# Patient Record
Sex: Female | Born: 1945
Health system: Southern US, Community
[De-identification: ages and names within clinical notes are randomized; demographics above are authoritative.]

## PROBLEM LIST (undated history)

## (undated) DIAGNOSIS — R3989 Other symptoms and signs involving the genitourinary system: Secondary | ICD-10-CM

## (undated) DIAGNOSIS — R3915 Urgency of urination: Secondary | ICD-10-CM

## (undated) DIAGNOSIS — H269 Unspecified cataract: Secondary | ICD-10-CM

## (undated) DIAGNOSIS — J42 Unspecified chronic bronchitis: Secondary | ICD-10-CM

## (undated) DIAGNOSIS — J410 Simple chronic bronchitis: Secondary | ICD-10-CM

## (undated) DIAGNOSIS — R058 Other specified cough: Secondary | ICD-10-CM

## (undated) DIAGNOSIS — E785 Hyperlipidemia, unspecified: Secondary | ICD-10-CM

## (undated) DIAGNOSIS — D649 Anemia, unspecified: Secondary | ICD-10-CM

## (undated) DIAGNOSIS — T8859XA Other complications of anesthesia, initial encounter: Secondary | ICD-10-CM

## (undated) DIAGNOSIS — T4145XA Adverse effect of unspecified anesthetic, initial encounter: Secondary | ICD-10-CM

## (undated) DIAGNOSIS — Z8719 Personal history of other diseases of the digestive system: Secondary | ICD-10-CM

## (undated) DIAGNOSIS — K589 Irritable bowel syndrome without diarrhea: Secondary | ICD-10-CM

## (undated) DIAGNOSIS — Z9889 Other specified postprocedural states: Secondary | ICD-10-CM

## (undated) DIAGNOSIS — N3010 Interstitial cystitis (chronic) without hematuria: Secondary | ICD-10-CM

## (undated) DIAGNOSIS — Z860101 Personal history of adenomatous and serrated colon polyps: Secondary | ICD-10-CM

## (undated) DIAGNOSIS — K2270 Barrett's esophagus without dysplasia: Secondary | ICD-10-CM

## (undated) DIAGNOSIS — I739 Peripheral vascular disease, unspecified: Secondary | ICD-10-CM

## (undated) DIAGNOSIS — K8681 Exocrine pancreatic insufficiency: Secondary | ICD-10-CM

## (undated) DIAGNOSIS — T7840XA Allergy, unspecified, initial encounter: Secondary | ICD-10-CM

## (undated) DIAGNOSIS — F329 Major depressive disorder, single episode, unspecified: Secondary | ICD-10-CM

## (undated) DIAGNOSIS — R350 Frequency of micturition: Secondary | ICD-10-CM

## (undated) DIAGNOSIS — F32A Depression, unspecified: Secondary | ICD-10-CM

## (undated) DIAGNOSIS — K219 Gastro-esophageal reflux disease without esophagitis: Secondary | ICD-10-CM

## (undated) DIAGNOSIS — I10 Essential (primary) hypertension: Secondary | ICD-10-CM

## (undated) DIAGNOSIS — K449 Diaphragmatic hernia without obstruction or gangrene: Secondary | ICD-10-CM

## (undated) DIAGNOSIS — J449 Chronic obstructive pulmonary disease, unspecified: Secondary | ICD-10-CM

## (undated) DIAGNOSIS — Z86010 Personal history of colonic polyps: Secondary | ICD-10-CM

## (undated) DIAGNOSIS — F419 Anxiety disorder, unspecified: Secondary | ICD-10-CM

## (undated) DIAGNOSIS — K5790 Diverticulosis of intestine, part unspecified, without perforation or abscess without bleeding: Secondary | ICD-10-CM

## (undated) DIAGNOSIS — R05 Cough: Secondary | ICD-10-CM

## (undated) HISTORY — DX: Unspecified cataract: H26.9

## (undated) HISTORY — DX: Exocrine pancreatic insufficiency: K86.81

## (undated) HISTORY — DX: Barrett's esophagus without dysplasia: K22.70

## (undated) HISTORY — DX: Hyperlipidemia, unspecified: E78.5

## (undated) HISTORY — PX: OTHER SURGICAL HISTORY: SHX169

## (undated) HISTORY — DX: Allergy, unspecified, initial encounter: T78.40XA

## (undated) HISTORY — DX: Chronic obstructive pulmonary disease, unspecified: J44.9

## (undated) HISTORY — DX: Anxiety disorder, unspecified: F41.9

## (undated) HISTORY — DX: Major depressive disorder, single episode, unspecified: F32.9

## (undated) HISTORY — DX: Depression, unspecified: F32.A

## (undated) HISTORY — PX: COLONOSCOPY: SHX174

## (undated) HISTORY — DX: Diverticulosis of intestine, part unspecified, without perforation or abscess without bleeding: K57.90

## (undated) HISTORY — DX: Interstitial cystitis (chronic) without hematuria: N30.10

## (undated) HISTORY — DX: Diaphragmatic hernia without obstruction or gangrene: K44.9

## (undated) HISTORY — DX: Anemia, unspecified: D64.9

## (undated) HISTORY — DX: Irritable bowel syndrome, unspecified: K58.9

## (undated) HISTORY — PX: ESOPHAGOGASTRODUODENOSCOPY: SHX1529

## (undated) HISTORY — DX: Gastro-esophageal reflux disease without esophagitis: K21.9

---
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
## 1973-07-31 HISTORY — PX: ABDOMINAL HYSTERECTOMY: SHX81

## 1988-07-31 HISTORY — PX: TEMPOROMANDIBULAR JOINT SURGERY: SHX35

## 1989-07-31 HISTORY — PX: SINUS SURGERY WITH INSTATRAK: SHX5215

## 1991-08-01 HISTORY — PX: CARPAL TUNNEL RELEASE: SHX101

## 1998-02-18 ENCOUNTER — Ambulatory Visit (HOSPITAL_COMMUNITY): Admission: RE | Admit: 1998-02-18 | Discharge: 1998-02-18 | Payer: Self-pay | Admitting: Gastroenterology

## 1998-12-10 ENCOUNTER — Ambulatory Visit (HOSPITAL_COMMUNITY): Admission: RE | Admit: 1998-12-10 | Discharge: 1998-12-10 | Payer: Self-pay | Admitting: Gastroenterology

## 1998-12-10 ENCOUNTER — Encounter: Payer: Self-pay | Admitting: Gastroenterology

## 1998-12-15 ENCOUNTER — Ambulatory Visit (HOSPITAL_COMMUNITY): Admission: RE | Admit: 1998-12-15 | Discharge: 1998-12-15 | Payer: Self-pay | Admitting: Gastroenterology

## 1998-12-15 ENCOUNTER — Encounter: Payer: Self-pay | Admitting: Gastroenterology

## 1999-01-03 ENCOUNTER — Encounter: Payer: Self-pay | Admitting: General Surgery

## 1999-01-07 ENCOUNTER — Observation Stay (HOSPITAL_COMMUNITY): Admission: RE | Admit: 1999-01-07 | Discharge: 1999-01-08 | Payer: Self-pay | Admitting: General Surgery

## 1999-09-08 ENCOUNTER — Other Ambulatory Visit: Admission: RE | Admit: 1999-09-08 | Discharge: 1999-09-08 | Payer: Self-pay | Admitting: Obstetrics and Gynecology

## 2000-05-17 ENCOUNTER — Encounter: Payer: Self-pay | Admitting: Gastroenterology

## 2000-05-17 ENCOUNTER — Ambulatory Visit (HOSPITAL_COMMUNITY): Admission: RE | Admit: 2000-05-17 | Discharge: 2000-05-17 | Payer: Self-pay | Admitting: Gastroenterology

## 2000-07-31 HISTORY — PX: LAPAROSCOPIC CHOLECYSTECTOMY: SUR755

## 2000-11-21 ENCOUNTER — Encounter: Payer: Self-pay | Admitting: Obstetrics and Gynecology

## 2000-11-21 ENCOUNTER — Encounter: Admission: RE | Admit: 2000-11-21 | Discharge: 2000-11-21 | Payer: Self-pay | Admitting: Obstetrics and Gynecology

## 2000-11-26 ENCOUNTER — Other Ambulatory Visit: Admission: RE | Admit: 2000-11-26 | Discharge: 2000-11-26 | Payer: Self-pay | Admitting: Obstetrics and Gynecology

## 2001-12-05 ENCOUNTER — Encounter: Payer: Self-pay | Admitting: Internal Medicine

## 2001-12-05 ENCOUNTER — Ambulatory Visit (HOSPITAL_COMMUNITY): Admission: RE | Admit: 2001-12-05 | Discharge: 2001-12-05 | Payer: Self-pay | Admitting: Specialist

## 2002-03-19 ENCOUNTER — Other Ambulatory Visit: Admission: RE | Admit: 2002-03-19 | Discharge: 2002-03-19 | Payer: Self-pay | Admitting: Obstetrics and Gynecology

## 2002-04-10 ENCOUNTER — Encounter (INDEPENDENT_AMBULATORY_CARE_PROVIDER_SITE_OTHER): Payer: Self-pay | Admitting: Gastroenterology

## 2002-04-10 DIAGNOSIS — K2940 Chronic atrophic gastritis without bleeding: Secondary | ICD-10-CM | POA: Insufficient documentation

## 2002-04-10 DIAGNOSIS — K449 Diaphragmatic hernia without obstruction or gangrene: Secondary | ICD-10-CM | POA: Insufficient documentation

## 2002-09-07 ENCOUNTER — Ambulatory Visit (HOSPITAL_COMMUNITY): Admission: RE | Admit: 2002-09-07 | Discharge: 2002-09-07 | Payer: Self-pay | Admitting: Internal Medicine

## 2002-09-07 ENCOUNTER — Encounter: Payer: Self-pay | Admitting: Internal Medicine

## 2003-04-10 ENCOUNTER — Other Ambulatory Visit: Admission: RE | Admit: 2003-04-10 | Discharge: 2003-04-10 | Payer: Self-pay | Admitting: Obstetrics and Gynecology

## 2003-06-05 ENCOUNTER — Encounter: Admission: RE | Admit: 2003-06-05 | Discharge: 2003-06-05 | Payer: Self-pay | Admitting: Obstetrics and Gynecology

## 2003-06-19 ENCOUNTER — Encounter: Admission: RE | Admit: 2003-06-19 | Discharge: 2003-06-19 | Payer: Self-pay | Admitting: Obstetrics and Gynecology

## 2004-06-20 ENCOUNTER — Encounter: Admission: RE | Admit: 2004-06-20 | Discharge: 2004-06-20 | Payer: Self-pay | Admitting: Obstetrics and Gynecology

## 2005-06-09 ENCOUNTER — Other Ambulatory Visit: Admission: RE | Admit: 2005-06-09 | Discharge: 2005-06-09 | Payer: Self-pay | Admitting: Obstetrics and Gynecology

## 2005-12-13 ENCOUNTER — Ambulatory Visit: Payer: Self-pay | Admitting: Gastroenterology

## 2006-09-27 ENCOUNTER — Encounter (INDEPENDENT_AMBULATORY_CARE_PROVIDER_SITE_OTHER): Payer: Self-pay | Admitting: Specialist

## 2006-09-27 ENCOUNTER — Observation Stay (HOSPITAL_COMMUNITY): Admission: RE | Admit: 2006-09-27 | Discharge: 2006-09-28 | Payer: Self-pay | Admitting: Otolaryngology

## 2006-09-27 HISTORY — PX: MICROLARYNGOSCOPY: SHX5208

## 2007-02-19 ENCOUNTER — Ambulatory Visit: Payer: Self-pay | Admitting: Internal Medicine

## 2007-02-19 LAB — CONVERTED CEMR LAB
Amylase: 34 units/L (ref 27–131)
Basophils Relative: 0.2 % (ref 0.0–1.0)
Eosinophils Absolute: 0.3 10*3/uL (ref 0.0–0.6)
Eosinophils Relative: 4.7 % (ref 0.0–5.0)
Hemoglobin: 13 g/dL (ref 12.0–15.0)
Lymphocytes Relative: 25.7 % (ref 12.0–46.0)
MCV: 90.3 fL (ref 78.0–100.0)
Monocytes Absolute: 0.4 10*3/uL (ref 0.2–0.7)
Monocytes Relative: 7.1 % (ref 3.0–11.0)
Neutro Abs: 3.5 10*3/uL (ref 1.4–7.7)
Platelets: 198 10*3/uL (ref 150–400)
Sed Rate: 22 mm/hr (ref 0–25)

## 2007-02-28 ENCOUNTER — Ambulatory Visit: Payer: Self-pay | Admitting: Internal Medicine

## 2007-03-14 ENCOUNTER — Ambulatory Visit: Payer: Self-pay | Admitting: Internal Medicine

## 2007-03-14 DIAGNOSIS — K5730 Diverticulosis of large intestine without perforation or abscess without bleeding: Secondary | ICD-10-CM | POA: Insufficient documentation

## 2007-03-14 HISTORY — DX: Diverticulosis of large intestine without perforation or abscess without bleeding: K57.30

## 2007-03-30 LAB — CONVERTED CEMR LAB: Pap Smear: NORMAL

## 2007-11-13 DIAGNOSIS — Z8669 Personal history of other diseases of the nervous system and sense organs: Secondary | ICD-10-CM | POA: Insufficient documentation

## 2007-11-13 DIAGNOSIS — K224 Dyskinesia of esophagus: Secondary | ICD-10-CM | POA: Insufficient documentation

## 2007-11-13 DIAGNOSIS — F3289 Other specified depressive episodes: Secondary | ICD-10-CM | POA: Insufficient documentation

## 2007-11-13 DIAGNOSIS — E785 Hyperlipidemia, unspecified: Secondary | ICD-10-CM | POA: Insufficient documentation

## 2007-11-13 DIAGNOSIS — K589 Irritable bowel syndrome without diarrhea: Secondary | ICD-10-CM

## 2007-11-13 DIAGNOSIS — F411 Generalized anxiety disorder: Secondary | ICD-10-CM | POA: Insufficient documentation

## 2007-11-13 DIAGNOSIS — F329 Major depressive disorder, single episode, unspecified: Secondary | ICD-10-CM

## 2007-11-13 DIAGNOSIS — K219 Gastro-esophageal reflux disease without esophagitis: Secondary | ICD-10-CM | POA: Insufficient documentation

## 2007-11-13 DIAGNOSIS — E7849 Other hyperlipidemia: Secondary | ICD-10-CM

## 2007-11-13 HISTORY — DX: Irritable bowel syndrome, unspecified: K58.9

## 2007-12-18 ENCOUNTER — Encounter: Admission: RE | Admit: 2007-12-18 | Discharge: 2007-12-18 | Payer: Self-pay | Admitting: Otolaryngology

## 2008-01-13 ENCOUNTER — Telehealth: Payer: Self-pay | Admitting: Internal Medicine

## 2008-01-14 ENCOUNTER — Encounter: Payer: Self-pay | Admitting: Internal Medicine

## 2008-01-14 ENCOUNTER — Ambulatory Visit: Payer: Self-pay | Admitting: Internal Medicine

## 2008-01-16 ENCOUNTER — Encounter: Payer: Self-pay | Admitting: Internal Medicine

## 2008-01-27 ENCOUNTER — Telehealth: Payer: Self-pay | Admitting: Internal Medicine

## 2008-03-06 ENCOUNTER — Telehealth: Payer: Self-pay | Admitting: Internal Medicine

## 2008-03-07 ENCOUNTER — Encounter
Admission: RE | Admit: 2008-03-07 | Discharge: 2008-03-07 | Payer: Self-pay | Admitting: Physical Medicine and Rehabilitation

## 2008-03-12 ENCOUNTER — Encounter: Payer: Self-pay | Admitting: Internal Medicine

## 2008-03-12 ENCOUNTER — Ambulatory Visit (HOSPITAL_COMMUNITY): Admission: RE | Admit: 2008-03-12 | Discharge: 2008-03-12 | Payer: Self-pay | Admitting: Internal Medicine

## 2008-03-12 ENCOUNTER — Ambulatory Visit: Payer: Self-pay | Admitting: *Deleted

## 2008-03-12 DIAGNOSIS — F341 Dysthymic disorder: Secondary | ICD-10-CM | POA: Insufficient documentation

## 2008-03-12 DIAGNOSIS — R259 Unspecified abnormal involuntary movements: Secondary | ICD-10-CM | POA: Insufficient documentation

## 2008-03-12 LAB — CONVERTED CEMR LAB
Basophils Absolute: 0 10*3/uL (ref 0.0–0.1)
Basophils Relative: 0.2 % (ref 0.0–3.0)
Calcium: 8.9 mg/dL (ref 8.4–10.5)
Creatinine, Ser: 0.7 mg/dL (ref 0.4–1.2)
Eosinophils Absolute: 0.1 10*3/uL (ref 0.0–0.7)
GFR calc Af Amer: 110 mL/min
GFR calc non Af Amer: 91 mL/min
Hemoglobin: 12.1 g/dL (ref 12.0–15.0)
MCHC: 33.5 g/dL (ref 30.0–36.0)
MCV: 93.8 fL (ref 78.0–100.0)
Neutro Abs: 5.1 10*3/uL (ref 1.4–7.7)
RBC: 3.84 M/uL — ABNORMAL LOW (ref 3.87–5.11)
RDW: 12.7 % (ref 11.5–14.6)
Sodium: 140 meq/L (ref 135–145)
TSH: 1.07 microintl units/mL (ref 0.35–5.50)

## 2008-04-01 ENCOUNTER — Encounter (INDEPENDENT_AMBULATORY_CARE_PROVIDER_SITE_OTHER): Payer: Self-pay | Admitting: *Deleted

## 2008-04-09 ENCOUNTER — Encounter: Admission: RE | Admit: 2008-04-09 | Discharge: 2008-04-09 | Payer: Self-pay | Admitting: Neurology

## 2008-04-14 ENCOUNTER — Telehealth: Payer: Self-pay | Admitting: Internal Medicine

## 2008-04-17 ENCOUNTER — Ambulatory Visit: Payer: Self-pay | Admitting: Internal Medicine

## 2008-04-17 DIAGNOSIS — R1310 Dysphagia, unspecified: Secondary | ICD-10-CM | POA: Insufficient documentation

## 2008-04-17 DIAGNOSIS — R1110 Vomiting, unspecified: Secondary | ICD-10-CM | POA: Insufficient documentation

## 2008-04-21 ENCOUNTER — Ambulatory Visit: Payer: Self-pay | Admitting: Cardiology

## 2008-04-23 ENCOUNTER — Ambulatory Visit (HOSPITAL_COMMUNITY): Admission: RE | Admit: 2008-04-23 | Discharge: 2008-04-23 | Payer: Self-pay | Admitting: Internal Medicine

## 2008-04-24 ENCOUNTER — Encounter: Payer: Self-pay | Admitting: Internal Medicine

## 2008-04-27 ENCOUNTER — Ambulatory Visit: Payer: Self-pay | Admitting: Family Medicine

## 2008-04-27 DIAGNOSIS — I739 Peripheral vascular disease, unspecified: Secondary | ICD-10-CM | POA: Insufficient documentation

## 2008-04-28 ENCOUNTER — Telehealth: Payer: Self-pay | Admitting: Internal Medicine

## 2008-05-04 ENCOUNTER — Telehealth: Payer: Self-pay | Admitting: Family Medicine

## 2008-05-06 LAB — CONVERTED CEMR LAB: Anti Nuclear Antibody(ANA): NEGATIVE

## 2008-05-07 ENCOUNTER — Encounter (INDEPENDENT_AMBULATORY_CARE_PROVIDER_SITE_OTHER): Payer: Self-pay | Admitting: *Deleted

## 2008-05-07 ENCOUNTER — Telehealth (INDEPENDENT_AMBULATORY_CARE_PROVIDER_SITE_OTHER): Payer: Self-pay | Admitting: *Deleted

## 2008-05-09 LAB — CONVERTED CEMR LAB
ALT: 16 units/L (ref 0–35)
Basophils Absolute: 0 10*3/uL (ref 0.0–0.1)
CO2: 30 meq/L (ref 19–32)
Calcium: 8.7 mg/dL (ref 8.4–10.5)
Creatinine, Ser: 1 mg/dL (ref 0.4–1.2)
Eosinophils Absolute: 0.2 10*3/uL (ref 0.0–0.7)
Eosinophils Relative: 3.1 % (ref 0.0–5.0)
Folate: 7.1 ng/mL
GFR calc Af Amer: 73 mL/min
HCT: 33.5 % — ABNORMAL LOW (ref 36.0–46.0)
Iron: 44 ug/dL (ref 42–145)
MCHC: 34.3 g/dL (ref 30.0–36.0)
MCV: 91.8 fL (ref 78.0–100.0)
Monocytes Absolute: 0.2 10*3/uL (ref 0.1–1.0)
Platelets: 212 10*3/uL (ref 150–400)
RDW: 12.7 % (ref 11.5–14.6)
TSH: 2.09 microintl units/mL (ref 0.35–5.50)
Total Bilirubin: 0.4 mg/dL (ref 0.3–1.2)
Total Protein: 6.5 g/dL (ref 6.0–8.3)
Transferrin: 194.8 mg/dL — ABNORMAL LOW (ref >=212.0)

## 2008-05-11 ENCOUNTER — Encounter (INDEPENDENT_AMBULATORY_CARE_PROVIDER_SITE_OTHER): Payer: Self-pay | Admitting: *Deleted

## 2008-05-14 ENCOUNTER — Ambulatory Visit: Payer: Self-pay | Admitting: Family Medicine

## 2008-05-21 ENCOUNTER — Telehealth (INDEPENDENT_AMBULATORY_CARE_PROVIDER_SITE_OTHER): Payer: Self-pay | Admitting: *Deleted

## 2008-05-22 ENCOUNTER — Encounter: Payer: Self-pay | Admitting: Family Medicine

## 2008-05-25 ENCOUNTER — Telehealth: Payer: Self-pay | Admitting: Internal Medicine

## 2008-05-26 ENCOUNTER — Ambulatory Visit: Payer: Self-pay | Admitting: Internal Medicine

## 2008-05-26 DIAGNOSIS — R1084 Generalized abdominal pain: Secondary | ICD-10-CM

## 2008-05-26 DIAGNOSIS — K5900 Constipation, unspecified: Secondary | ICD-10-CM | POA: Insufficient documentation

## 2008-05-26 DIAGNOSIS — R109 Unspecified abdominal pain: Secondary | ICD-10-CM | POA: Insufficient documentation

## 2008-05-26 DIAGNOSIS — R112 Nausea with vomiting, unspecified: Secondary | ICD-10-CM | POA: Insufficient documentation

## 2008-05-27 ENCOUNTER — Telehealth: Payer: Self-pay | Admitting: Internal Medicine

## 2008-05-28 ENCOUNTER — Telehealth: Payer: Self-pay | Admitting: Internal Medicine

## 2008-05-29 ENCOUNTER — Telehealth: Payer: Self-pay | Admitting: Internal Medicine

## 2008-06-01 ENCOUNTER — Ambulatory Visit: Payer: Self-pay | Admitting: Internal Medicine

## 2008-06-05 ENCOUNTER — Ambulatory Visit: Payer: Self-pay | Admitting: Family Medicine

## 2008-06-12 ENCOUNTER — Telehealth: Payer: Self-pay | Admitting: Internal Medicine

## 2008-06-22 ENCOUNTER — Ambulatory Visit: Payer: Self-pay | Admitting: Family Medicine

## 2008-06-22 LAB — CONVERTED CEMR LAB
Alkaline Phosphatase: 65 units/L (ref 39–117)
Bilirubin, Direct: 0.1 mg/dL (ref 0.0–0.3)
Total CHOL/HDL Ratio: 8.4
VLDL: 32 mg/dL (ref 0–40)

## 2008-06-23 ENCOUNTER — Ambulatory Visit: Payer: Self-pay | Admitting: Internal Medicine

## 2008-06-29 ENCOUNTER — Ambulatory Visit (HOSPITAL_COMMUNITY): Admission: RE | Admit: 2008-06-29 | Discharge: 2008-06-29 | Payer: Self-pay | Admitting: Internal Medicine

## 2008-07-14 ENCOUNTER — Encounter (INDEPENDENT_AMBULATORY_CARE_PROVIDER_SITE_OTHER): Payer: Self-pay | Admitting: *Deleted

## 2008-07-14 ENCOUNTER — Telehealth (INDEPENDENT_AMBULATORY_CARE_PROVIDER_SITE_OTHER): Payer: Self-pay | Admitting: *Deleted

## 2008-07-16 ENCOUNTER — Telehealth (INDEPENDENT_AMBULATORY_CARE_PROVIDER_SITE_OTHER): Payer: Self-pay | Admitting: *Deleted

## 2008-07-28 ENCOUNTER — Encounter: Payer: Self-pay | Admitting: Internal Medicine

## 2008-07-30 ENCOUNTER — Ambulatory Visit: Payer: Self-pay | Admitting: Family Medicine

## 2008-07-30 DIAGNOSIS — J069 Acute upper respiratory infection, unspecified: Secondary | ICD-10-CM | POA: Insufficient documentation

## 2008-08-24 ENCOUNTER — Encounter: Payer: Self-pay | Admitting: Internal Medicine

## 2008-10-05 ENCOUNTER — Inpatient Hospital Stay (HOSPITAL_COMMUNITY): Admission: RE | Admit: 2008-10-05 | Discharge: 2008-10-12 | Payer: Self-pay | Admitting: General Surgery

## 2008-10-05 ENCOUNTER — Encounter (INDEPENDENT_AMBULATORY_CARE_PROVIDER_SITE_OTHER): Payer: Self-pay | Admitting: General Surgery

## 2008-10-05 HISTORY — PX: OTHER SURGICAL HISTORY: SHX169

## 2008-11-30 ENCOUNTER — Encounter: Payer: Self-pay | Admitting: Internal Medicine

## 2008-12-11 ENCOUNTER — Ambulatory Visit: Payer: Self-pay | Admitting: Internal Medicine

## 2008-12-16 ENCOUNTER — Ambulatory Visit: Payer: Self-pay | Admitting: Family Medicine

## 2009-01-11 ENCOUNTER — Ambulatory Visit: Payer: Self-pay | Admitting: Family Medicine

## 2009-01-11 DIAGNOSIS — M542 Cervicalgia: Secondary | ICD-10-CM | POA: Insufficient documentation

## 2009-01-25 ENCOUNTER — Encounter: Payer: Self-pay | Admitting: Internal Medicine

## 2009-02-22 ENCOUNTER — Ambulatory Visit: Payer: Self-pay | Admitting: Family Medicine

## 2009-02-22 DIAGNOSIS — S40029A Contusion of unspecified upper arm, initial encounter: Secondary | ICD-10-CM | POA: Insufficient documentation

## 2009-02-23 ENCOUNTER — Ambulatory Visit: Payer: Self-pay | Admitting: Family Medicine

## 2009-02-23 ENCOUNTER — Encounter (INDEPENDENT_AMBULATORY_CARE_PROVIDER_SITE_OTHER): Payer: Self-pay | Admitting: *Deleted

## 2009-02-26 ENCOUNTER — Telehealth (INDEPENDENT_AMBULATORY_CARE_PROVIDER_SITE_OTHER): Payer: Self-pay | Admitting: *Deleted

## 2009-02-26 LAB — CONVERTED CEMR LAB
Basophils Relative: 3.1 % — ABNORMAL HIGH (ref 0.0–3.0)
Eosinophils Relative: 2.3 % (ref 0.0–5.0)
HCT: 35.4 % — ABNORMAL LOW (ref 36.0–46.0)
Lymphs Abs: 1.7 10*3/uL (ref 0.7–4.0)
MCV: 88.8 fL (ref 78.0–100.0)
Monocytes Absolute: 1.1 10*3/uL — ABNORMAL HIGH (ref 0.1–1.0)
RBC: 3.98 M/uL (ref 3.87–5.11)
WBC: 5.2 10*3/uL (ref 4.5–10.5)

## 2009-03-01 ENCOUNTER — Ambulatory Visit: Payer: Self-pay | Admitting: Family Medicine

## 2009-03-01 LAB — CONVERTED CEMR LAB
Basophils Relative: 1 % (ref 0–1)
Eosinophils Absolute: 0.2 10*3/uL (ref 0.0–0.7)
Hemoglobin: 11.7 g/dL — ABNORMAL LOW (ref 12.0–15.0)
MCHC: 31.4 g/dL (ref 30.0–36.0)
MCV: 90.3 fL (ref 78.0–100.0)
Monocytes Absolute: 0.7 10*3/uL (ref 0.1–1.0)
Monocytes Relative: 8 % (ref 3–12)
Neutro Abs: 5.7 10*3/uL (ref 1.7–7.7)
RBC: 4.13 M/uL (ref 3.87–5.11)

## 2009-03-03 ENCOUNTER — Encounter (INDEPENDENT_AMBULATORY_CARE_PROVIDER_SITE_OTHER): Payer: Self-pay | Admitting: *Deleted

## 2009-03-03 ENCOUNTER — Telehealth (INDEPENDENT_AMBULATORY_CARE_PROVIDER_SITE_OTHER): Payer: Self-pay | Admitting: *Deleted

## 2009-03-11 ENCOUNTER — Encounter: Payer: Self-pay | Admitting: Family Medicine

## 2009-03-18 ENCOUNTER — Encounter: Payer: Self-pay | Admitting: Family Medicine

## 2009-03-19 ENCOUNTER — Ambulatory Visit: Payer: Self-pay | Admitting: Family Medicine

## 2009-03-19 DIAGNOSIS — R5383 Other fatigue: Secondary | ICD-10-CM

## 2009-03-19 DIAGNOSIS — D509 Iron deficiency anemia, unspecified: Secondary | ICD-10-CM

## 2009-03-19 DIAGNOSIS — R5381 Other malaise: Secondary | ICD-10-CM | POA: Insufficient documentation

## 2009-03-19 HISTORY — DX: Iron deficiency anemia, unspecified: D50.9

## 2009-03-25 ENCOUNTER — Encounter (INDEPENDENT_AMBULATORY_CARE_PROVIDER_SITE_OTHER): Payer: Self-pay | Admitting: *Deleted

## 2009-03-25 ENCOUNTER — Telehealth (INDEPENDENT_AMBULATORY_CARE_PROVIDER_SITE_OTHER): Payer: Self-pay | Admitting: *Deleted

## 2009-03-26 LAB — CONVERTED CEMR LAB
Albumin: 3.7 g/dL (ref 3.5–5.2)
Basophils Relative: 0.7 % (ref 0.0–3.0)
Calcium: 8.8 mg/dL (ref 8.4–10.5)
Chloride: 111 meq/L (ref 96–112)
Cholesterol: 253 mg/dL — ABNORMAL HIGH (ref 0–200)
Creatinine, Ser: 0.9 mg/dL (ref 0.4–1.2)
Eosinophils Relative: 1.2 % (ref 0.0–5.0)
Ferritin: 21.9 ng/mL (ref 10.0–291.0)
GFR calc non Af Amer: 67.7 mL/min (ref >60)
HDL: 32.5 mg/dL — ABNORMAL LOW (ref >39.00)
Iron: 40 ug/dL — ABNORMAL LOW (ref 42–145)
Lymphocytes Relative: 9.9 % — ABNORMAL LOW (ref 12.0–46.0)
MCV: 90.6 fL (ref 78.0–100.0)
Monocytes Relative: 1.5 % — ABNORMAL LOW (ref 3.0–12.0)
Neutrophils Relative %: 86.7 % — ABNORMAL HIGH (ref 43.0–77.0)
RBC: 4.6 M/uL (ref 3.87–5.11)
Saturation Ratios: 12.1 % — ABNORMAL LOW (ref 20.0–50.0)
TSH: 1.24 microintl units/mL (ref 0.35–5.50)
Total Bilirubin: 0.5 mg/dL (ref 0.3–1.2)
Total CHOL/HDL Ratio: 8
Transferrin: 235.2 mg/dL (ref 212.0–360.0)
Triglycerides: 260 mg/dL — ABNORMAL HIGH (ref 0.0–149.0)
VLDL: 52 mg/dL — ABNORMAL HIGH (ref 0.0–40.0)
Vit D, 25-Hydroxy: 10 ng/mL — ABNORMAL LOW (ref 30–89)
Vitamin B-12: 353 pg/mL (ref 211–911)
WBC: 8.7 10*3/uL (ref 4.5–10.5)

## 2009-03-30 ENCOUNTER — Ambulatory Visit: Payer: Self-pay | Admitting: Family Medicine

## 2009-04-09 ENCOUNTER — Ambulatory Visit: Payer: Self-pay | Admitting: Family Medicine

## 2009-04-09 DIAGNOSIS — E538 Deficiency of other specified B group vitamins: Secondary | ICD-10-CM | POA: Insufficient documentation

## 2009-04-14 ENCOUNTER — Ambulatory Visit: Payer: Self-pay | Admitting: Family Medicine

## 2009-04-16 ENCOUNTER — Ambulatory Visit: Payer: Self-pay | Admitting: Family Medicine

## 2009-04-16 DIAGNOSIS — L719 Rosacea, unspecified: Secondary | ICD-10-CM | POA: Insufficient documentation

## 2009-04-19 ENCOUNTER — Encounter: Payer: Self-pay | Admitting: Family Medicine

## 2009-04-19 LAB — CONVERTED CEMR LAB: INR: 1 (ref 0.0–1.5)

## 2009-04-21 ENCOUNTER — Telehealth: Payer: Self-pay | Admitting: Family Medicine

## 2009-04-23 ENCOUNTER — Ambulatory Visit: Payer: Self-pay | Admitting: Family Medicine

## 2009-04-25 LAB — CONVERTED CEMR LAB
AST: 18 units/L (ref 0–37)
Alkaline Phosphatase: 70 units/L (ref 39–117)
Platelets: 251 10*3/uL (ref 150.0–400.0)
RDW: 15.3 % — ABNORMAL HIGH (ref 11.5–14.6)
Total Bilirubin: 0.5 mg/dL (ref 0.3–1.2)
WBC: 6.3 10*3/uL (ref 4.5–10.5)
aPTT: 23.3 s (ref 21.7–28.8)

## 2009-04-27 ENCOUNTER — Ambulatory Visit: Payer: Self-pay | Admitting: Hematology & Oncology

## 2009-05-21 ENCOUNTER — Encounter: Payer: Self-pay | Admitting: Family Medicine

## 2009-05-21 LAB — CBC WITH DIFFERENTIAL (CANCER CENTER ONLY)
BASO%: 0.6 % (ref 0.0–2.0)
EOS%: 2.6 % (ref 0.0–7.0)
Eosinophils Absolute: 0.2 10*3/uL (ref 0.0–0.5)
LYMPH%: 24.5 % (ref 14.0–48.0)
MCH: 29.8 pg (ref 26.0–34.0)
MCHC: 33.6 g/dL (ref 32.0–36.0)
MCV: 89 fL (ref 81–101)
MONO%: 5.2 % (ref 0.0–13.0)
NEUT#: 5.3 10*3/uL (ref 1.5–6.5)
Platelets: 279 10*3/uL (ref 145–400)
RBC: 4.12 10*6/uL (ref 3.70–5.32)
RDW: 13.7 % (ref 10.5–14.6)

## 2009-05-21 LAB — CHCC SATELLITE - SMEAR

## 2009-05-22 LAB — PROTHROMBIN TIME: Prothrombin Time: 12.8 seconds (ref 11.6–15.2)

## 2009-05-22 LAB — VITAMIN D 25 HYDROXY (VIT D DEFICIENCY, FRACTURES): Vit D, 25-Hydroxy: 29 ng/mL — ABNORMAL LOW (ref 30–89)

## 2009-05-27 LAB — VON WILLEBRAND PANEL
Factor-VIII Activity: 88 % (ref 50–150)
Von Willebrand Ag: 160 % normal (ref 61–164)

## 2009-05-27 LAB — APTT: aPTT: 35 seconds (ref 24–37)

## 2009-06-07 ENCOUNTER — Ambulatory Visit: Payer: Self-pay | Admitting: Family Medicine

## 2009-06-07 DIAGNOSIS — M25569 Pain in unspecified knee: Secondary | ICD-10-CM

## 2009-06-07 DIAGNOSIS — M79605 Pain in left leg: Secondary | ICD-10-CM | POA: Insufficient documentation

## 2009-07-08 ENCOUNTER — Ambulatory Visit: Payer: Self-pay | Admitting: Hematology & Oncology

## 2009-07-08 ENCOUNTER — Ambulatory Visit: Payer: Self-pay | Admitting: Family Medicine

## 2009-07-08 ENCOUNTER — Telehealth: Payer: Self-pay | Admitting: Family Medicine

## 2009-07-08 ENCOUNTER — Encounter (INDEPENDENT_AMBULATORY_CARE_PROVIDER_SITE_OTHER): Payer: Self-pay | Admitting: *Deleted

## 2009-07-09 ENCOUNTER — Encounter (INDEPENDENT_AMBULATORY_CARE_PROVIDER_SITE_OTHER): Payer: Self-pay | Admitting: *Deleted

## 2009-07-09 ENCOUNTER — Telehealth: Payer: Self-pay | Admitting: Family Medicine

## 2009-07-30 ENCOUNTER — Encounter: Payer: Self-pay | Admitting: Family Medicine

## 2009-09-07 ENCOUNTER — Encounter: Payer: Self-pay | Admitting: Family Medicine

## 2009-09-09 ENCOUNTER — Encounter: Payer: Self-pay | Admitting: Family Medicine

## 2009-11-08 ENCOUNTER — Encounter: Payer: Self-pay | Admitting: Family Medicine

## 2009-12-21 ENCOUNTER — Encounter (INDEPENDENT_AMBULATORY_CARE_PROVIDER_SITE_OTHER): Payer: Self-pay | Admitting: *Deleted

## 2010-02-02 ENCOUNTER — Telehealth: Payer: Self-pay | Admitting: Internal Medicine

## 2010-02-04 ENCOUNTER — Ambulatory Visit: Payer: Self-pay | Admitting: Internal Medicine

## 2010-03-09 ENCOUNTER — Encounter: Payer: Self-pay | Admitting: Internal Medicine

## 2010-05-09 ENCOUNTER — Telehealth: Payer: Self-pay | Admitting: Internal Medicine

## 2010-05-12 ENCOUNTER — Encounter (INDEPENDENT_AMBULATORY_CARE_PROVIDER_SITE_OTHER): Payer: Self-pay | Admitting: *Deleted

## 2010-05-13 ENCOUNTER — Ambulatory Visit: Payer: Self-pay | Admitting: Internal Medicine

## 2010-05-17 ENCOUNTER — Ambulatory Visit: Payer: Self-pay | Admitting: Internal Medicine

## 2010-05-19 ENCOUNTER — Encounter: Payer: Self-pay | Admitting: Internal Medicine

## 2010-05-23 ENCOUNTER — Telehealth: Payer: Self-pay | Admitting: Internal Medicine

## 2010-05-26 ENCOUNTER — Ambulatory Visit (HOSPITAL_COMMUNITY): Admission: RE | Admit: 2010-05-26 | Discharge: 2010-05-26 | Payer: Self-pay | Admitting: Internal Medicine

## 2010-07-05 ENCOUNTER — Ambulatory Visit: Payer: Self-pay | Admitting: Family Medicine

## 2010-07-05 ENCOUNTER — Telehealth: Payer: Self-pay | Admitting: Family Medicine

## 2010-07-05 ENCOUNTER — Encounter: Admission: RE | Admit: 2010-07-05 | Discharge: 2010-07-05 | Payer: Self-pay | Admitting: Family Medicine

## 2010-07-05 DIAGNOSIS — R1032 Left lower quadrant pain: Secondary | ICD-10-CM | POA: Insufficient documentation

## 2010-07-05 LAB — CONVERTED CEMR LAB
ALT: 10 units/L (ref 0–35)
AST: 17 units/L (ref 0–37)
Albumin: 3.5 g/dL (ref 3.5–5.2)
Alkaline Phosphatase: 73 units/L (ref 39–117)
Basophils Absolute: 0.1 10*3/uL (ref 0.0–0.1)
Basophils Relative: 1.2 % (ref 0.0–3.0)
CO2: 31 meq/L (ref 19–32)
Calcium: 8.7 mg/dL (ref 8.4–10.5)
GFR calc non Af Amer: 107.63 mL/min (ref >60.00)
Glucose, Bld: 90 mg/dL (ref 70–99)
HCT: 36 % (ref 36.0–46.0)
Hemoglobin: 12.1 g/dL (ref 12.0–15.0)
Lymphocytes Relative: 22 % (ref 12.0–46.0)
Lymphs Abs: 1.4 10*3/uL (ref 0.7–4.0)
Monocytes Relative: 6.3 % (ref 3.0–12.0)
Neutro Abs: 4.3 10*3/uL (ref 1.4–7.7)
Potassium: 3.8 meq/L (ref 3.5–5.1)
RBC: 3.89 M/uL (ref 3.87–5.11)
RDW: 14.3 % (ref 11.5–14.6)
Sodium: 141 meq/L (ref 135–145)
Total Protein: 6.1 g/dL (ref 6.0–8.3)

## 2010-07-07 ENCOUNTER — Ambulatory Visit: Payer: Self-pay | Admitting: Internal Medicine

## 2010-07-07 ENCOUNTER — Telehealth: Payer: Self-pay | Admitting: Internal Medicine

## 2010-07-11 ENCOUNTER — Ambulatory Visit (HOSPITAL_COMMUNITY)
Admission: RE | Admit: 2010-07-11 | Discharge: 2010-07-11 | Payer: Self-pay | Source: Home / Self Care | Attending: Internal Medicine | Admitting: Internal Medicine

## 2010-07-12 ENCOUNTER — Encounter (INDEPENDENT_AMBULATORY_CARE_PROVIDER_SITE_OTHER): Payer: Self-pay | Admitting: *Deleted

## 2010-07-12 ENCOUNTER — Ambulatory Visit: Payer: Self-pay | Admitting: Internal Medicine

## 2010-07-14 ENCOUNTER — Encounter: Payer: Self-pay | Admitting: Internal Medicine

## 2010-08-21 ENCOUNTER — Encounter: Payer: Self-pay | Admitting: Family Medicine

## 2010-09-01 NOTE — Progress Notes (Signed)
Summary: TRIAGE   Phone Note Call from Patient Call back at [phone]   [name]: Patient Call For: [name] Reason for Call: Talk to Nurse Summary of Call: Patient wants to be seen this week for severe diarrhea and rectal cramp states that the reason she 'No Showed' yesterdays appt is because her daughter was in a car wreck and she had to go out of town and forgot to give [place] a call. Initial call taken by: Tawni Levy,  February 02, 2010 9:05 AM  Follow-up for Phone Call        DR.Samanyu Tinnell PLEASE ADVISE ON WHEN TO R/S THIS PATIENT.  Follow-up by: Laureen Ochs LPN,  February 02, 1190 10:51 AM  Additional Follow-up for Phone Call Additional follow up Details #1::        She should have called anyway. First available. Additional Follow-up by: Hart Carwin MD,  February 02, 2010 1:40 PM    Additional Follow-up for Phone Call Additional follow up Details #2::    Above MD orders reviewed with patient. She will see Dr.Nakeya Adinolfi on 02-04-10 at 9am, she assures me she will keep appointment.  Follow-up by: Laureen Ochs LPN,  February 02, 4781 2:14 PM

## 2010-09-01 NOTE — Letter (Signed)
Summary: [facility]  [facility]   Imported By: Lanelle Bal 11/15/2009 11:15:00  _____________________________________________________________________  External Attachment:    Type:   Image     Comment:   External Document

## 2010-09-01 NOTE — Letter (Signed)
Summary: Endoscopy Letter  [facility]  [address] [place], [place] [address]   Phone: [phone]  Fax: [phone]      Dec 21, 2009 MRN: [id number]   [facility] [address] [place], [place]  [address]   Dear Ms. Blakesley,   According to your medical record, it is time for you to schedule an Endoscopy. Endoscopic screening is recommended for patients with certain upper digestive tract conditions because of associated increased risk for cancers of the upper digestive system.  This letter has been generated based on the recommendations made at the time of your prior procedure. If you feel that in your particular situation this may no longer apply, please contact our office.  Please call our office at [phone]) to schedule this appointment or to update your records at your earliest convenience.  Thank you for cooperating with [place] to provide you with the very best care possible.   Sincerely,  Hedwig Morton. [name], M.D.  [facility] [phone]

## 2010-09-01 NOTE — Letter (Signed)
Summary: Need to Schedule Endoscopy  [facility]  [address] [place], [place] [address]   Phone: [phone]  Fax: [phone]             March 09, 2010 MRN: [id number]    [facility] [address] [place], [place]  [address]    Dear Ms. Wible,  Our office has been trying to contact you regarding setting up an endoscopy as recommended by Dr [name]. Unfortunately, we have been unsuccessful in contacting you to set this up. Please call as soon as possible to schedule this procedure which is extremely important especially with your history of Barrett's esophagus. You may contact us [phone]. We look forward to continuing in your health care.   Sincerely,     Hedwig Morton. [name], MD  Appended Document: Need to Schedule Endoscopy ---- 02/04/2010 9:33 AM, Lamona Curl CMA (AAMA) wrote: call Patient to set up endo......(dr [name] forgot to tell her when she was in the office)  ---- 03/04/2010 8:43 AM, Lamona Curl CMA (AAMA) wrote: called Patient home phone. no answer unable to take voicemail. called Patient  work, they are unable to locate her  ---- 03/09/2010 12:00 PM, Dottie Nelson-Smith CMA (AAMA) wrote: sent letter to patient...did she ever call to schedule endo appointment?  ---- 03/23/2010 2:24 PM, Lamona Curl CMA (AAMA) wrote: Dr Leonard SchwartzLorain Childes...Marland KitchenMarland KitchenPatient needs endoscopy for f/u Barretts. Unfortunately I have been unable to contact her. I sent a letter to her back on 03/09/10 with no response yet. Just wanted to make you aware   ---- 03/23/2010 2:27 PM, Hart Carwin MD wrote: Rip Harbour

## 2010-09-01 NOTE — Letter (Signed)
Summary: Out of Work  [facility]  [address] [place], [place] [address]   Phone: [phone]  Fax: [phone]    July 12, 2010   Employee:  Cybill TALAYIA HJORT    To Whom It May Concern:   For Medical reasons, please excuse the above named employee from work for the following dates:  Start:    End:    If you need additional information, please feel free to contact our office.         Sincerely,    Clide Cliff RN

## 2010-09-01 NOTE — Assessment & Plan Note (Signed)
Summary: ABD. PAIN             Leah [name]    History of Present Illness Visit Type: Follow-up Consult Primary GI MD: [name] MD Primary Provider: Lelon Perla , DO Requesting Provider: Terrill Mohr, MD Chief Complaint: Intermittant LLQ abd pain and tenderness now x 3-4 weeks. Pt had 4 days of diarrhea last weekend. Pt states she can still feel the abdominal soreness. History of Present Illness:   This is a 65 year old white female who is status post sigmoid resection for symptomatic diverticulosis in October 2010 who now has recurrent left lower quadrant abdominal pain and diarrhea which started about a week ago after a dinner in her office consisting of spaghetti, salad and other things. She denies rectal bleeding but admits to a low-grade temperature of 99.2. She is doing better today. She has a history of Barrett's esophagus. An upper endoscopy in September 2003 showed a 3 cm hiatal hernia and mild esophageal stricture which was dilated to 80 [place]. Her last upper endoscopy in June 2009 again confirmed  hiatal hernia. She was also dilated to 16 mm. The biopsy showed intestinal metaplasia consistent with Barrett's. She is due for a recall upper endoscopy.   GI Review of Systems    Reports abdominal pain, acid reflux, and  nausea.     Location of  Abdominal pain: LLQ.    Denies belching, bloating, chest pain, dysphagia with liquids, dysphagia with solids, heartburn, loss of appetite, vomiting, vomiting blood, weight loss, and  weight gain.      Reports diarrhea.     Denies anal fissure, black tarry stools, change in bowel habit, constipation, diverticulosis, fecal incontinence, heme positive stool, hemorrhoids, irritable bowel syndrome, jaundice, light color stool, liver problems, rectal bleeding, and  rectal pain.    Current Medications (verified): 1)  Nexium 40 Mg  Cpdr (Esomeprazole Magnesium) .... Take 1 Tablet By By Mouth Two Times A Day 2)  Vivelle-Dot 0.1 Mg/24hr  Pttw  (Estradiol) .... Use As Directed 3)  Zoloft 50 Mg Tabs (Sertraline Hcl) .... Take One Tablet Daily 4)  Ativan 1 Mg Tabs (Lorazepam) .Marland Kitchen.. 1 By Mouth Three Times A Day As Needed Anxiety 5)  Lipitor 40 Mg Tabs (Atorvastatin Calcium) .Marland Kitchen.. 1 By Mouth At Bedtime 6)  Flexeril 10 Mg Tabs (Cyclobenzaprine Hcl) .Marland Kitchen.. 1 By Mouth Three Times A Day 7)  Vitamin D (Ergocalciferol) 50000 Unit Caps (Ergocalciferol) .... Take 1 Tab Once Daily  Allergies: 1)  ! Penicillin 2)  ! Sulfa 3)  ! Cipro 4)  ! Augmentin  Past History:  Past Medical History: Reviewed history from 04/27/2008 and no changes required. GASTRITIS, CHRONIC (ICD-535.10) HIATAL HERNIA (ICD-553.3) DIVERTICULOSIS, COLON (ICD-562.10) CARPAL TUNNEL SYNDROME, HX OF (ICD-V12.49) ANXIETY (ICD-300.00) DEPRESSION (ICD-311) ESOPHAGEAL SPASM (ICD-530.5) HYPERLIPIDEMIA (ICD-272.4) IBS (ICD-564.1) GERD (ICD-530.81) hospitalized for pneumonia 12/19/07---1 week  Past Surgical History: Reviewed history from 04/17/2008 and no changes required. Tonsillectomy-1964 Adenoidectomy Laparoscopic cholecystectomy by Dr. Maple Hudson in 2002 Hysterectomy-1975 TMJ-1990 Sinus surgery-1991 Carpal Tunnel-1993 Polyps removed from vocal cords  Family History: Reviewed history from 05/26/2008 and no changes required. Family History Diabetes 1st degree relative Father Family History High cholesterol Family History Hypertension- Father Family History of Prostate CA 1st degree relative- Father Family History of Colon Polyps: Mother Family History Pancreatic cancer-Father, Mother Family History Leukemia-Father  Social History: Reviewed history from 06/23/2008 and no changes required. 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 Married Illicit Drug Use - no Current Smoker Drug  use-no Alcohol Use - no  Review of Systems  The patient denies allergy/sinus, anemia, anxiety-new, arthritis/joint pain, back pain, blood in urine,  breast changes/lumps, change in vision, confusion, cough, coughing up blood, depression-new, fainting, fatigue, fever, headaches-new, hearing problems, heart murmur, heart rhythm changes, itching, menstrual pain, muscle pains/cramps, night sweats, nosebleeds, pregnancy symptoms, shortness of breath, skin rash, sleeping problems, sore throat, swelling of feet/legs, swollen lymph glands, thirst - excessive , urination - excessive , urination changes/pain, urine leakage, vision changes, and voice change.         Pertinent positive and negative review of systems were noted in the above HPI. All other ROS was otherwise negative.   Vital Signs:  Patient profile:   65 year old female Height:      61.50 inches Weight:      117.25 pounds BMI:     21.87 Pulse rate:   90 / minute Pulse rhythm:   regular BP sitting:   142 / 68  (right arm) Cuff size:   regular  Vitals Entered By: Christie Nottingham CMA Duncan Dull) (February 04, 2010 8:59 AM)  Physical Exam  General:  patient is a smoker and smells of smoke. Her voice is raspy. Eyes:  nonicteric. Neck:  Supple; no masses or thyromegaly. Lungs:  Clear throughout to auscultation. Heart:  Regular rate and rhythm; no murmurs, rubs,  or bruits. Abdomen:  soft abdomen with tenderness on deep pressure in the left lower left middle quadrant and along the splenic flexure. There is no rebound or mass effect. Right upper and lower quadrants are unremarkable. Rectal:  showed Hemoccult negative stool. Extremities:  No clubbing, cyanosis, edema or deformities noted. Skin:  palmar erythema. Psych:  Alert and cooperative. Normal mood and affect.   Impression & Recommendations:  Problem # 1:  DIVERTICULOSIS-COLON (ICD-562.10) Patient has recurrent left lower quadrant abdominal pain, this time most likely due to irritable bowel syndrome following a large meal. She is status post sigmoid resection one year ago. We will start her on Flagyl 250 mg p.o. t.i.d. for bacterial  overgrowth and Bentyl 20 mg p.o. b.i.d. for 10-14 days. She will stay on a low-residue diet.  Problem # 2:  GERD (ICD-530.81) Patient has Barrett's esophagus diagnosed in June 2009. When she recovers from her current attack, she will need a repeat upper endoscopy for followup of Barrett's esophagus.  Patient Instructions: 1)  Please pick up your Flagyl at the pharmacy. We have sent a prescription electronically for Flagyl 250 mg two times a day x 7 days. 2)  Begin Bentyl 20 mg by mouth two times a day. We have sent an electronic prescription for this as well. 3)  We have given your refills of Nexium. 4)  When the current episode has resolved, we will consider repeating an upper endoscopy for follow up of Barrett's esophagus. 5)  Copy sent to : Dr Loreen Freud, Dr Terrill Mohr 6)  The medication list was reviewed and reconciled.  All changed / newly prescribed medications were explained.  A complete medication list was provided to the patient / caregiver. Prescriptions: NEXIUM 40 MG  CPDR (ESOMEPRAZOLE MAGNESIUM) Take 1 tablet by by mouth two times a day  #30 x 3   Entered by:   Lamona Curl CMA (AAMA)   Authorized by:   Hart Carwin MD   Signed by:   Lamona Curl CMA (AAMA) on 02/04/2010   Method used:   [facility]  [address] [address] [phone][facility] (retail[facility]       [address]       [place], [place]  [address]       Ph: [phone] or [phone]       Fax: [phone]   RxID:   [id number] BENTYL 20 MG TABS (DICYCLOMINE HCL) Take 1 tablet by mouth two times a day  #40 x 1   Entered by:   Lamona Curl CMA (AAMA)   Authorized by:   Hart Carwin MD   Signed by:   Lamona Curl CMA (AAMA) on 02/04/2010   Method used:   [facility]        [facility]  [address] [phone]* (retail)       [address]       [place], [place]  [address]       Ph: [phone] or [phone]       Fax: [phone]   RxID:    [id number] FLAGYL 250 MG TABS (METRONIDAZOLE) Take 1 tablet by mouth two times a day x 7 days  #14 x 0   Entered by:   Lamona Curl CMA (AAMA)   Authorized by:   Hart Carwin MD   Signed by:   Lamona Curl CMA (AAMA) on 02/04/2010   Method used:   [facility]        [facility]  [address] [phone]* (retail)       [address]       [place], [place]  [address]       Ph: [phone] or [phone]       Fax: [phone]   RxID:   [id number]

## 2010-09-01 NOTE — Procedures (Signed)
Summary: Upper Endoscopy  Patient: Leah [name] Note: All result statuses are Final unless otherwise noted.  Tests: (1) Upper Endoscopy (EGD)   EGD Upper Endoscopy       DONE (C)     [facility]     [address]     [place], [place]  [address]           ENDOSCOPY PROCEDURE REPORT           PATIENT:  Leah [name], Leah [name]  MR#:  [id number]     BIRTHDATE:  11-27-1945, 63 yrs. old  GENDER:  female           ENDOSCOPIST:  Hedwig Morton. [name], MD     Referred by:  Loreen Freud, DO           PROCEDURE DATE:  05/17/2010     PROCEDURE:  EGD with biopsy, EGD with dilatation over guidewire     ASA CLASS:  Class III     INDICATIONS:  h/o Barrett's Esophagus, dysphagia Barrett's     esophagus 2003, 2007,2009,     prior EGD's with dilation in 1994 and 1997     smoker, Nexiem 40mg  po bid with breakthrough Sx's           MEDICATIONS:   Versed 10 mg, Fentanyl 100 mcg     TOPICAL ANESTHETIC:  Exactacain Spray           DESCRIPTION OF PROCEDURE:   After the risks benefits and     alternatives of the procedure were thoroughly explained, informed     consent was obtained.  The LB GIF-H180 [id number] endoscope was     introduced through the mouth and advanced to the second portion of     the duodenum, without limitations.  The instrument was slowly     withdrawn as the mucosa was fully examined.     <<PROCEDUREIMAGES>>           A stricture was found in the distal esophagus. benign appearing     fibrous stricture at z-line With standard forceps, a biopsy was     obtained and sent to pathology (see image1). Savary dilation over     a guidewire 14,15,16,17 mm Dilators passed over the guidewire  A     hiatal hernia was found (see image7). 2-3 cm HH  Mild gastritis     was found (see image2 and image4). coffee ground material in the     stomach    Retroflexed views revealed no abnormalities.    The     scope was then withdrawn from the patient and the procedure     completed.        COMPLICATIONS:  None           ENDOSCOPIC IMPRESSION:     1) Stricture in the distal esophagus     2) Hiatal hernia     3) Mild gastritis     s/p dilation to 74F     RECOMMENDATIONS:     1) Await pathology results     2) Anti-reflux regimen to be follow     Nexiem 40 mg po bid     stop smoking           REPEAT EXAM:  2 years           ______________________________     Hedwig Morton. [name], MD           CC:  n.     REVISED:  05/17/2010 04:01 PM     eSIGNED:   Hedwig Morton. Brodie at 05/17/2010 04:01 PM           Arakelian, [place], [phone]  Note: An exclamation mark (!) indicates a result that was not dispersed into the flowsheet. Document Creation Date: 05/17/2010 4:03 PM _______________________________________________________________________  (1) Order result status: Final Collection or observation date-time: 05/17/2010 15:50 Requested date-time:  Receipt date-time:  Reported date-time:  Referring Physician:   Ordering Physician: [name] [phone]) Specimen Source:  Source: Launa Grill Order Number: [id number] Lab site:   Appended Document: Upper Endoscopy     Procedures Next Due Date:    EGD: 04/2012

## 2010-09-01 NOTE — Letter (Signed)
Summary: Out of Work  [facility]  [address] [place], [place] [address]   Phone: [phone]  Fax: [phone]      July 05, 2010   Employee:  CYNTHEA ZACHMAN    To Whom It May Concern:   For Medical reasons, please excuse the above named employee from work for the following dates:  Start:   07/05/2010  End:   07/06/2010  If you need additional information, please feel free to contact our office.         Sincerely,       Loreen Freud DO

## 2010-09-01 NOTE — Progress Notes (Signed)
Summary: results of imaging test??  Phone Note Call from Patient Call back at Home Phone [phone]   [name]: Patient Summary of Call: patient called to ask about results of her imaging test--please call her back  if she needs medication, [facility] [address] [facility] [place]--[facility] [phone] Initial call taken by: Jerolyn Shin,  July 05, 2010 3:20 PM  Follow-up for Phone Call        see CT Follow-up by: Loreen Freud DO,  July 05, 2010 4:37 PM  Additional Follow-up for Phone Call Additional follow up Details #1::        pt aware of CT results and lab results, Allergic to Cipro, So I can only call in the flagyl, pt aware and notified to f/u with GI Doctor. Will print work note for today and leave it at check in. Additional Follow-up by: Almeta Monas CMA Duncan Dull),  July 05, 2010 4:43 PM    New/Updated Medications: FLAGYL 500 MG TABS (METRONIDAZOLE) 1 by mouth every 8 hours for 10 days Prescriptions: FLAGYL 500 MG TABS (METRONIDAZOLE) 1 by mouth every 8 hours for 10 days  #30 x 0   Entered by:   Almeta Monas CMA (AAMA)   Authorized by:   Loreen Freud DO   Signed by:   Almeta Monas CMA (AAMA) on 07/05/2010   Method used:   [facility]        [facility]  [address] [phone][facility]retail)       [address]       [place], [place]  [address]       Ph: [phone] or [phone]       Fax: [phone]   RxID:   [id number]

## 2010-09-01 NOTE — Miscellaneous (Signed)
Summary: endo-barretts, dysphagia/ previsit done  Clinical Lists Changes  Allergies: Added new allergy or adverse reaction of AVELOX Observations: Added new observation of ALLERGY REV: Done (05/13/2010 15:22)

## 2010-09-01 NOTE — Letter (Signed)
Summary: Patient Notice-Barrett's [facility]  [address] [place], [place] [address]   Phone: [phone]  Fax: [phone]        May 19, 2010 MRN: [id number]    Leah [name] [address] [place], [place]  [address]    Dear Leah [name],  I am pleased to inform you that the biopsies taken during your recent endoscopic examination did not show any evidence of cancer upon pathologic examination.  However, your biopsies indicate you have a condition known as Barrett's esophagus. While not cancer, it is pre-cancerous (can progress to cancer) and needs to be monitored with repeat endoscopic examination and biopsies.  Fortunately, it is quite rare that this develops into cancer, but careful monitoring of the condition along with taking your medication as prescribed is important in reducing the risk of developing cancer.  It is my recommendation that you have a repeat upper gastrointestinal endoscopic examination in 2_ years.  Additional information/recommendations:  __Please call [phone] to schedule a return visit to further      evaluate your condition.  _x_Continue with treatment plan as outlined the day of your exam.  Please call us if you have or develop heartburn, reflux symptoms, any swallowing problems, or if you have questions about your condition that have not been fully answered at this time.  Sincerely,  Hart Carwin MD  This letter has been electronically signed by your physician.  Appended Document: Patient Notice-Barrett's Esopghagus letter mailed

## 2010-09-01 NOTE — Medication Information (Signed)
Summary: Possible Nonadherence with Lipid Med/[facility]  Possible Nonadherence with Lipid Med/[facility]   Imported By: Lanelle Bal 08/06/2009 13:02:13  _____________________________________________________________________  External Attachment:    Type:   Image     Comment:   External Document  Appended Document: Possible Nonadherence with Lipid Med/[facility] did pt stop lipitor?  Appended Document: Possible Nonadherence with Lipid Med/[facility] Pt states she had to go to an UC and they started her on an ATB. She will restart after ATB and come and have her lipid level checked.

## 2010-09-01 NOTE — Progress Notes (Signed)
Summary: Triage  Medications Added ZANTAC 150 MG TABS (RANITIDINE HCL) Take one tablet by mouth at mid day       Phone Note Call from Patient Call back at Home Phone [phone]   [name]: Patient Call For: Dr. [name] Reason for Call: Talk to Nurse Summary of Call: Abd pain, belching...taking Nexium twice daily and its not working Initial call taken by: Karna Christmas,  May 23, 2010 10:35 AM  Follow-up for Phone Call        Pt called with c/o adbominal pain under the ribs and in middle of breasts and belching. Belching has caused her to vomit a couple of times. Denies fever, diffculty swallowing. Taking Nexium  40 mg two times a day.  States "My hiatal hernia hurts. It was hurting before my endo too." States she is maintaining anti reflux diet.Pt had endo with dilation on 05/17/10. Hx of barrett's esphogaus. Patient also states she had taken Prilosec and Prevacid in the past. Please, advise. Follow-up by: Jesse Fall RN,  May 23, 2010 11:19 AM  Additional Follow-up for Phone Call Additional follow up Details #1::        first of all, she needs to stop smoking because smoking lowers the LES and causes more reflux. Please chedule Barium esophagram and UGI to see if she has a spontaneous reflux and motility disorder. Please add Zantac 150 mg by mouth in mid day,#30, 2 refills Additional Follow-up by: Hart Carwin MD,  May 23, 2010 12:33 PM    Additional Follow-up for Phone Call Additional follow up Details #2::    Pt notified that Dr. [name] recommends she stop smoking . Pt scheduled  for Barium esphogram and UGI on 05/26/10 @ 9:00 at [facility].(Scheduled with Alisha) Pt to arrive in radiology at 8:45. Pt instructed to be NPO after midnight. Patient instructed  that rx is called in for Zantac 150 mg by mouth at midday. Pt verbalizes understanding of recommendations. Follow-up by: Jesse Fall RN,  May 23, 2010 2:05 PM  New/Updated Medications: ZANTAC 150 MG TABS  (RANITIDINE HCL) Take one tablet by mouth at mid day Prescriptions: ZANTAC 150 MG TABS (RANITIDINE HCL) Take one tablet by mouth at mid day  #30 x 2   Entered by:   Jesse Fall RN   Authorized by:   Hart Carwin MD   Signed by:   Jesse Fall RN on 05/23/2010   Method used:   [facility]  [address] [phone][facility] ([facility]ail)       [address]       [place], [place]  [address]       Ph: [phone] or [phone]       Fax: [phone]   RxID:   [id number]   Appended Document: Triage    Clinical Lists Changes  Orders: Added new Test order of Barium Swallow (Barium Swallow) - Signed Added new Test order of UGI Series (UGI Series) - Signed

## 2010-09-01 NOTE — Progress Notes (Signed)
----   Converted from flag ---- ---- 07/05/2010 4:29 PM, Almeta Monas CMA (AAMA) wrote: Call report:  thickening of the mucosa near the junction of the descending and proximal sigmoid colon is noted most c/w mild diverticulitis.  No abscess. Surgical sutures from prior surgery on the sigmoid colon at the site of previous diverticulitis. Small midline ant pelvic abd wall hernia. ------------------------------

## 2010-09-01 NOTE — Letter (Signed)
Summary: Patient [facility] Biopsy Results  [facility]  [address] [place], [place] [address]   Phone: [phone]  Fax: [phone]        July 14, 2010 MRN: [id number]    Leah [name] [address] [place], [place]  [address]    Dear Ms. Baham,  I am pleased to inform you that the biopsies taken during your recent endoscopic examination did not show any evidence of cancer upon pathologic examination.The biopsies  show an ulcerated colon tissue  Additional information/recommendations:  __No further action is needed at this time.  Please follow-up with      your primary care physician for your other healthcare needs.  _x_ Please call [phone] to schedule a return visit to review      your condition.  _x_ Continue with the treatment plan as outlined on the day of your      exam.  _.   Please call us if you are having persistent problems or have questions about your condition that have not been fully answered at this time.  Sincerely,  Hart Carwin MD  This letter has been electronically signed by your physician.  Appended Document: Patient Notice-Endo Biopsy Results letter mailed

## 2010-09-01 NOTE — Letter (Signed)
Summary: [facility]  [address] [place], [place] [address]   Phone: [phone]  Fax: [phone]       SARENA JEZEK    Jan 14, 1946    MRN: [id number]        Procedure Day /Date: Tuesday 07/12/10     Arrival Time: 1:00 pm     Procedure Time: 2:00 pm    Location of Procedure:                    _ x_  [facility] (4th Floor)  PREPARATION FOR FLEXIBLE SIGMOIDOSCOPY WITH MAGNESIUM CITRATE  Prior to the day before your procedure, purchase one 8 oz. bottle of Magnesium Citrate and one Fleet Enema from the laxative section of your drugstore.  _________________________________________________________________________________________________  THE DAY BEFORE YOUR PROCEDURE             DATE: 07/11/10    DAY: Tuesday  1.   Have a clear liquid dinner the night before your procedure.  2.   Do not drink anything colored red or purple.  Avoid juices with pulp.  No orange juice.              CLEAR LIQUIDS INCLUDE: Water Jello Ice Popsicles Tea (sugar ok, no milk/cream) Powdered fruit flavored drinks Coffee (sugar ok, no milk/cream) Gatorade Juice: apple, white grape, white cranberry  Lemonade Clear bullion, consomm, broth Carbonated beverages (any kind) Strained chicken noodle soup Hard Candy   3.   At 7:00 pm the night before your procedure, drink one bottle of Magnesium Citrate over ice.  4.   Drink at least 3 more glasses of clear liquids before bedtime (preferably juices).  5.   Results are expected usually within 1 to 6 hours after taking the Magnesium Citrate.  ___________________________________________________________________________________________________  THE DAY OF YOUR PROCEDURE            DATE: 07/12/10     DAY: Tuesday  1.   Use Fleet Enema one hour prior to coming for procedure.  2.   You may drink clear liquids until 12:00 pm (2 hours before exam)        MEDICATION INSTRUCTIONS  Unless otherwise  instructed, you should take regular prescription medications with a small sip of water as early as possible the morning of your procedure.          OTHER INSTRUCTIONS  You will need a responsible adult at least 63 years of age to accompany you and drive you home.   This person must remain in the waiting room during your procedure.  Wear loose fitting clothing that is easily removed.  Leave jewelry and other valuables at home.  However, you may wish to bring a book to read or an iPod/MP3 player to listen to music as you wait for your procedure to start.  Remove all body piercing jewelry and leave at home.  Total time from sign-in until discharge is approximately 2-3 hours.  You should go home directly after your procedure and rest.  You can resume normal activities the day after your procedure.  The day of your procedure you should not:   Drive   Make legal decisions   Operate machinery   Drink alcohol   Return to work  You will receive specific instructions about eating, activities and medications before you leave.   The above instructions have been reviewed and explained to me by   _______________________    I fully understand  and can verbalize these instructions _____________________________ Date _________

## 2010-09-01 NOTE — Procedures (Signed)
Summary: Flexible Sigmoidoscopy  Patient: [name] Note: All result statuses are Final unless otherwise noted.  Tests: (1) Flexible Sigmoidoscopy (FLX)  FLX Flexible Sigmoidoscopy                             DONE     [facility]     [address]     [place], [place]  [address]           FLEXIBLE SIGMOIDOSCOPY PROCEDURE REPORT           PATIENT:  Leah [name], Leah [name]  MR#:  [id number]     BIRTHDATE:  October 01, 1945, 64 yrs. old  GENDER:  female           ENDOSCOPIST:  Hedwig Morton. [name], MD     Referred by:  Loreen Freud, DO           PROCEDURE DATE:  07/12/2010     PROCEDURE:  Flexible Sigmoidoscopy with biopsy     ASA CLASS:  Class II     INDICATIONS:  abdominal pain LLQ abd. pain, s/p sigmoid resection     for chronic diverticular disease 1 year ago, now having recurrent     pain,     CT scan shows stranding around the left colon           MEDICATIONS:   Versed 7 mg, Fentanyl 50 mcg           DESCRIPTION OF PROCEDURE:   After the risks benefits and     alternatives of the procedure were thoroughly explained, informed     consent was obtained.  Digital rectal exam was performed and     revealed no rectal masses.   The [id number] endoscope     was introduced through the anus and advanced to the descending     colon, without limitations.  The quality of the prep was good.     The instrument was then slowly withdrawn as the mucosa was fully     examined.     <<PROCEDUREIMAGES>>           A postop change was noted. end-to-side sigmoid anastomosis with a     small pouch, wide open lumen, shallow ulcer at the anastomosis,,     appears benign With standard forceps, biopsy was obtained and sent     to pathology (see image4, image5, and image2).  The examination     was otherwise normal (see image6). no diverticuli, no colitis     Retroflexed views in the rectum revealed no abnormalities.    The     scope was then withdrawn from the patient and the procedure    terminated.           COMPLICATIONS:  None           ENDOSCOPIC IMPRESSION:     1) Postop change     2) Otherwise normal examination.     anastomotic ulcer, widely open sigmoid anastomosis. ?? ischemic     ulcer? s/p biopsies     RECOMMENDATIONS:     1) await biopsy results     finish the antibiotics, stay on soft diet           REPEAT EXAM:  In 0 year(s) for.           ______________________________     Hedwig Morton. [name], MD           CC:  n.     eSIGNED:   Hedwig Morton. Brodie at 07/12/2010 03:53 PM           Victorian, Gunn, [phone]  Note: An exclamation mark (!) indicates a result that was not dispersed into the flowsheet. Document Creation Date: 07/12/2010 3:53 PM _______________________________________________________________________  (1) Order result status: Final Collection or observation date-time: 07/12/2010 15:38 Requested date-time:  Receipt date-time:  Reported date-time:  Referring Physician:   Ordering Physician: [name] [phone]) Specimen Source:  Source: Launa Grill Order Number: [id number] Lab site:

## 2010-09-01 NOTE — Letter (Signed)
Summary: EGD Instructions  [facility]  [address] [place], [place] [address]   Phone: [phone]  Fax: [phone]       Leah [name]    1945/12/21    MRN: [id number]       Procedure Day /Date:  Tuesday 05/17/2010     Arrival Time: 3:00 pm     Procedure Time: 4:00 pm     Location of Procedure:                    _ x _ [facility] (4th Floor)    PREPARATION FOR ENDOSCOPY   On Tuesday 10/18 THE DAY OF THE PROCEDURE:  1.   No solid foods, milk or milk products are allowed after midnight the night before your procedure.  2.  Do not drink anything colored red or purple.  Avoid juices with pulp.  No orange juice.  3.  You may drink clear liquids until 2:00 pm, which is 2 hours before your procedure.                                                                                                CLEAR LIQUIDS INCLUDE: Water Jello Ice Popsicles Tea (sugar ok, no milk/cream) Powdered fruit flavored drinks Coffee (sugar ok, no milk/cream) Gatorade Juice: apple, white grape, white cranberry  Lemonade Clear bullion, consomm, broth Carbonated beverages (any kind) Strained chicken noodle soup Hard Candy   MEDICATION INSTRUCTIONS  Unless otherwise instructed, you should take regular prescription medications with a small sip of water as early as possible the morning of your procedure.    Additional medication instructions: n/a             OTHER INSTRUCTIONS  You will need a responsible adult at least 63 years of age to accompany you and drive you home.   This person must remain in the waiting room during your procedure.  Wear loose fitting clothing that is easily removed.  Leave jewelry and other valuables at home.  However, you may wish to bring a book to read or an iPod/MP3 player to listen to music as you wait for your procedure to start.  Remove all body piercing jewelry and leave at home.  Total time from sign-in until  discharge is approximately 2-3 hours.  You should go home directly after your procedure and rest.  You can resume normal activities the day after your procedure.  The day of your procedure you should not:   Drive   Make legal decisions   Operate machinery   Drink alcohol   Return to work  You will receive specific instructions about eating, activities and medications before you leave.    The above instructions have been reviewed and explained to me by   Sherren Kerns RN  May 13, 2010 3:37 PM     I fully understand and can verbalize these instructions _____________________________ Date _________

## 2010-09-01 NOTE — Progress Notes (Signed)
Summary: Triage   Phone Note Call from Patient Call back at Home Phone [phone]   [name]: Patient Call For: Dr. [name] Reason for Call: Talk to Nurse Summary of Call: Pt has been to primary doctor and had CT, CT showed she had diverticulitis and was told to follow up with brodie as soon as possible Initial call taken by: [place] Johnson,  July 07, 2010 8:42 AM  Follow-up for Phone Call        Patient calling to report she has diverticulitis again per her PCP. Patient states she saw her PCP on Tuesday for diarrhea and stomach pain since last Friday. Patient thought she had a virus. Pain is in the LLQ and patient continues to have diarrhea off and on.PCP did a CT scan which showed diverticulitis. PCP put her on Flagyl yesterday and told her to f/u with Dr. [name]. Patient scheduled for today at 3 PM. Follow-up by: Jesse Fall RN,  July 07, 2010 9:49 AM  Additional Follow-up for Phone Call Additional follow up Details #1::        will see her today. Additional Follow-up by: Hart Carwin MD,  July 07, 2010 1:23 PM

## 2010-09-01 NOTE — Assessment & Plan Note (Signed)
Summary: diverticulitis/Regina    History of Present Illness Visit Type: Follow-up Visit Primary GI MD: [name] MD Primary Provider: Loreen Freud, DO Requesting Provider: na Chief Complaint: LLQ abd pain, diarrhea, back pain, and fever. History of Present Illness:   This is a 65 year old white female with a 4 day history of left lower quadrant abdominal pain localized anteriorly and resembling her diverticulitis. She is now one year post laparoscopic assisted sigmoid colectomy for chronic diverticular disease. She did have a flareup of left lower quadrant abdominal pain in June of this year and again 4 days ago. She is also having diarrhea but no rectal bleeding. She feels hot and cold but there has been no fever. She is a smoker. A CT Scan of the abdomen on 6/12/11showed thickening of the mucosa of the proximal sigmoid colon at the anastomosis, pericolic stranding and a small midline anterior wall abdominal hernia. Her last colonoscopy in August 2008 confirmed a tortuous left colon with diverticulosis and narrow lumen. The exam was rather difficult. She has a history of Barrett's esophagus. Her last upper endoscopy was in October 2011 and prior to that in 2009. She has a history of esophageal stricture. A laparoscopic cholecystectomy was done in 2002.   GI Review of Systems    Reports abdominal pain.     Location of  Abdominal pain: left side.    Denies acid reflux, belching, bloating, chest pain, dysphagia with liquids, dysphagia with solids, heartburn, loss of appetite, nausea, vomiting, vomiting blood, weight loss, and  weight gain.      Reports diarrhea and  diverticulosis.     Denies anal fissure, black tarry stools, change in bowel habit, constipation, fecal incontinence, heme positive stool, hemorrhoids, irritable bowel syndrome, jaundice, light color stool, liver problems, rectal bleeding, and  rectal pain.    Current Medications (verified): 1)  Nexium 40 Mg  Cpdr (Esomeprazole  Magnesium) .... Take 1 Tablet By By Mouth Two Times A Day 2)  Vivelle-Dot 0.1 Mg/24hr  Pttw (Estradiol) .... Use As Directed 3)  Zoloft 50 Mg Tabs (Sertraline Hcl) .... Take One Tablet Daily 4)  Ativan 1 Mg Tabs (Lorazepam) .Marland Kitchen.. 1 By Mouth Three Times A Day As Needed Anxiety 5)  Lipitor 40 Mg Tabs (Atorvastatin Calcium) .Marland Kitchen.. 1 By Mouth At Bedtime 6)  Zantac 150 Mg Tabs (Ranitidine Hcl) .... Take One Tablet By Mouth At Mid Day 7)  Flagyl 500 Mg Tabs (Metronidazole) .Marland Kitchen.. 1 By Mouth Every 8 Hours For 10 Days  Allergies (verified): 1)  ! Penicillin 2)  ! Sulfa 3)  ! Cipro 4)  ! Augmentin 5)  ! Avelox  Past History:  Past Medical History: ABDOMINAL PAIN, LEFT LOWER QUADRANT (ICD-789.04) KNEE PAIN (ICD-719.46) ROSACEA (ICD-695.3) B12 DEFICIENCY (ICD-266.2) ANEMIA, IRON DEFICIENCY (ICD-280.9) MALAISE AND FATIGUE (ICD-780.79) CONTUSION, ARM (ICD-923.9) NECK PAIN, LEFT (ICD-723.1) HYPERLIPIDEMIA (ICD-272.4) URI (ICD-465.9) DIVERTICULOSIS-COLON (ICD-562.10) GERD (ICD-530.81) NAUSEA AND VOMITING (ICD-787.01) ABDOMINAL PAIN-GENERALIZED (ICD-789.07) CONSTIPATION (ICD-564.00) MUSCLE PAIN (ICD-729.1) DYSPHAGIA UNSPECIFIED (ICD-787.20) VOMITING (ICD-787.03) TREMOR (ICD-781.0) FAMILY HISTORY DIABETES 1ST DEGREE RELATIVE (ICD-V18.0) ENCOUNTER FOR LONG-TERM USE OF OTHER MEDICATIONS (ICD-V58.69) ANXIETY DEPRESSION (ICD-300.4) GASTRITIS, CHRONIC (ICD-535.10) HIATAL HERNIA (ICD-553.3) DIVERTICULOSIS, COLON (ICD-562.10) CARPAL TUNNEL SYNDROME, HX OF (ICD-V12.49) ANXIETY (ICD-300.00) DEPRESSION (ICD-311) ESOPHAGEAL SPASM (ICD-530.5) HYPERLIPIDEMIA (ICD-272.4) IBS (ICD-564.1) GERD (ICD-530.81) hospitalized for pneumonia 12/19/07---1 week  Past Surgical History: Tonsillectomy-1964 Adenoidectomy Laparoscopic cholecystectomy by Dr. Maple Hudson in 2002 Hysterectomy-1975 TMJ-1990 Sinus surgery-1991 Carpal Tunnel-1993 Polyps removed from vocal cords  Laparoscopic-assisted partial  colectomy  Family History: Family History Diabetes  1st degree relative Father Family History High cholesterol Family History Hypertension- Father Family History of Prostate CA 1st degree relative- Father Family History of Colon Polyps: Mother Family History Pancreatic cancer-Father, Mother Family History Leukemia-Father No FH of Colon Cancer:  Social History: Reviewed history from 06/23/2008 and no changes required. 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  The patient denies allergy/sinus, anemia, anxiety-new, arthritis/joint pain, blood in urine, breast changes/lumps, change in vision, confusion, cough, coughing up blood, depression-new, fainting, fatigue, headaches-new, hearing problems, heart murmur, heart rhythm changes, itching, menstrual pain, muscle pains/cramps, night sweats, nosebleeds, pregnancy symptoms, shortness of breath, skin rash, sleeping problems, sore throat, swelling of feet/legs, swollen lymph glands, thirst - excessive , urination - excessive , urination changes/pain, urine leakage, vision changes, and voice change.         .rod   Vital Signs:  Patient profile:   65 year old female Height:      61.50 inches Weight:      122 pounds BMI:     22.76 BSA:     1.54 Temp:     98.2 degrees F oral Pulse rate:   92 / minute Pulse rhythm:   regular BP sitting:   128 / 64  (left arm) Cuff size:   regular  Vitals Entered By: Ok Anis CMA (July 07, 2010 4:26 PM)  Physical Exam  General:  alert, oriented and in no distress. Mouth:  deep voice due to smoking. Neck:  short carotid bruit with question of left subclavian bruit. Chest Wall:  no chest wall tenderness. Lungs:  Clear throughout to auscultation. Heart:  Regular rate and rhythm; no murmurs, rubs,  or bruits. Abdomen:   soft abdomen with marked tenderness in left middle quadrant. Well-healed surgical scar. No abdominal bruits. No rebound. Pulses:  soft Hemoccult-negative stool; questionable bruits in both femoral arteries. Extremities:  no edema. Skin:  Intact without significant lesions or rashes. Psych:  Alert and cooperative. Normal mood and affect.   Impression & Recommendations:  Problem # 1:  ABDOMINAL PAIN, LEFT LOWER QUADRANT (ICD-789.04)  Patient has had a recurrence of acute left lower quadrant abdominal pain with radiographic evidence of inflammatory changes in the left colon. I doubt that this is diverticulitis since she has had her sigmoid colon resected one year ago. I question the possibility of ischemic colitis or just an irritable bowel syndrome. We will add doxycycline 100 mg a day for 10 days to her regimen of Flagyl and schedule her for a flexible sigmoidoscopy to assess the left colon. She will stay on full liquids and take Bentyl 10 mg 3 times a day.  Orders: Flex with Sedation (Flex w/Sed)  Problem # 2:  B12 DEFICIENCY (ICD-266.2) Assessment: Comment Only  Problem # 3:  NECK PAIN, LEFT (ICD-723.1) Patient has a carotid bruit on the left with some symptoms including the left arm being cold and hurting at times. We need to rule out vascular insufficiency since patient is a heavy smoker. We will schedule her for a Doppler study of the left carotid as well as of the subclavian artery.  Other Orders: GI Misc Procedure/ Radiology Order (GI Misc )  Patient Instructions: 1)  You have been scheduled for a flexible sigmoidoscopy. Please follow written instructions given to you at your  visit today. 2)  Please pick up your prescriptions at the pharmacy. Electronic prescription(s) has already been sent for Doxycycline 100 mg daily x 10 days. 3)  Please take your Bentyl 10 mg daily x 10 days. 4)  We will schedule you for a Doppler of the Carotid artery and Left Subclavian artery first thing  tommorrow morning and will call you with your appointment date and time. 5)  Copy sent to : Loreen Freud, DO 6)  The medication list was reviewed and reconciled.  All changed / newly prescribed medications were explained.  A complete medication list was provided to the patient / caregiver. Prescriptions: DOXYCYCLINE HYCLATE 100 MG SOLR (DOXYCYCLINE HYCLATE) Take 1 tablet by mouth once daily x 10 days  #10 x 0   Entered by:   Lamona Curl CMA (AAMA)   Authorized by:   Hart Carwin MD   Signed by:   Lamona Curl CMA (AAMA) on 07/07/2010   Method used:   Electronically to        [facility]  [address] [phone]* (retail)       [address]       [place], [place]  [address]       Ph: [phone] or [phone]       Fax: [phone]   RxID:   [id number]

## 2010-09-01 NOTE — Assessment & Plan Note (Signed)
Summary: think she has the flu//ph   Vital Signs:  Patient profile:   65 year old female Weight:      124.0 pounds Temp:     98.5 degrees F oral BP sitting:   120 / 80  (right arm) Cuff size:   regular  Vitals Entered By: Almeta Monas CMA Duncan Dull) (July 05, 2010 9:38 AM) CC: [id number] c/o abdominal pain, fever, nausea and diarrhea-- unable to void-- pt has not take Lipitor in 21mo she stated she ran out., Diarrhea   History of Present Illness:  Diarrhea      This is a 65 year old woman who presents with Diarrhea.  The symptoms began 5 days ago.  Pt started with diarrhea friday, sat and Sun---and by Sunday night it stopped but now she still has nausea and abd "soreness'" on left side.  The patient complains of >6 stools per day and watery/unformed stools, but denies 3 stools or less per day, 4-6 stools per day, semiformed/loose stools, voluminous stools, blood in stool, mucus in stool, greasy stools, malodorous stools, fecal urgency, fecal soiling, alternating diarrhea/constipation, nocturnal diarrhea, fasting diarrhea, bloating, gassiness, abrupt onset of symptoms, and gradual onset of symptoms.  Associated symptoms include fever, abdominal pain, and nausea.  The patient denies vomiting, lightheadedness, increased thirst, weight loss, and joint pains.  The symptoms are better with hypomotility agents.  Patient has a  history of diverticulitis and bowel resection.    Current Medications (verified): 1)  Nexium 40 Mg  Cpdr (Esomeprazole Magnesium) .... Take 1 Tablet By By Mouth Two Times A Day 2)  Vivelle-Dot 0.1 Mg/24hr  Pttw (Estradiol) .... Use As Directed 3)  Zoloft 50 Mg Tabs (Sertraline Hcl) .... Take One Tablet Daily 4)  Ativan 1 Mg Tabs (Lorazepam) .Marland Kitchen.. 1 By Mouth Three Times A Day As Needed Anxiety 5)  Lipitor 40 Mg Tabs (Atorvastatin Calcium) .Marland Kitchen.. 1 By Mouth At Bedtime 6)  Bentyl 20 Mg Tabs (Dicyclomine Hcl) .... Take 1 Tablet By Mouth Two Times A Day 7)  Zantac 150 Mg Tabs  (Ranitidine Hcl) .... Take One Tablet By Mouth At Mid Day  Allergies (verified): 1)  ! Penicillin 2)  ! Sulfa 3)  ! Cipro 4)  ! Augmentin 5)  ! Avelox  Past History:  Past Medical History: Last updated: 04/27/2008 GASTRITIS, CHRONIC (ICD-535.10) HIATAL HERNIA (ICD-553.3) DIVERTICULOSIS, COLON (ICD-562.10) CARPAL TUNNEL SYNDROME, HX OF (ICD-V12.49) ANXIETY (ICD-300.00) DEPRESSION (ICD-311) ESOPHAGEAL SPASM (ICD-530.5) HYPERLIPIDEMIA (ICD-272.4) IBS (ICD-564.1) GERD (ICD-530.81) hospitalized for pneumonia 12/19/07---1 week  Past Surgical History: Last updated: 04/17/2008 Tonsillectomy-1964 Adenoidectomy Laparoscopic cholecystectomy by Dr. 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 Family History Diabetes 1st degree relative Father Family History High cholesterol Family History Hypertension- Father Family History of Prostate CA 1st degree relative- Father Family History of Colon Polyps: Mother Family History Pancreatic cancer-Father,  Mother Family History Leukemia-Father  Social History: Reviewed history from 06/23/2008 and no  changes required. 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   Rectal:  No external abnormalities noted. Normal sphincter tone. No rectal masses or tenderness. heme neg brown stool Psych:  Oriented X3 and normally interactive.     Impression & Recommendations:  Problem # 1:  ABDOMINAL PAIN, LEFT LOWER QUADRANT (ICD-789.04)  Orders: Venipuncture (25053) Radiology Referral (Radiology) TLB-BMP (Basic Metabolic Panel-BMET) (80048-METABOL) TLB-CBC Platelet - w/Differential (85025-CBCD) TLB-Hepatic/Liver Function Pnl (80076-HEPATIC)  Discussed symptom control with the patient.   Complete Medication List: 1)  Nexium 40 Mg Cpdr (Esomeprazole magnesium) .... Take 1 tablet by by mouth two times a day 2)  Vivelle-dot 0.1 Mg/24hr Pttw (Estradiol) .... Use as directed 3)  Zoloft 50 Mg Tabs (Sertraline hcl) .... Take one tablet daily 4)  Ativan 1 Mg Tabs (lorazepam)  .Marland Kitchen.. 1 by mouth three times a day as needed anxiety 5)  Lipitor 40 Mg Tabs (Atorvastatin calcium) .Marland Kitchen.. 1 by mouth at bedtime 6)  Bentyl 20 Mg Tabs (Dicyclomine hcl) .... Take 1 tablet by mouth two times a day 7)  Zantac 150 Mg Tabs (Ranitidine hcl) .... Take one tablet by mouth at mid day 8)  Flagyl 500 Mg Tabs (Metronidazole) .Marland Kitchen.. 1 by mouth every 8 hours for 10 days Prescriptions: LIPITOR 40 MG TABS (ATORVASTATIN CALCIUM) 1 by mouth at bedtime  #30 x 2   Entered and Authorized by:   Loreen Freud DO   Signed by:   Loreen Freud DO on 07/05/2010   Method used:   Electronically to        [facility]  [address] [phone] (retail)       [address]       [place],  [place]  [address]       Ph: [phone] or [phone]       Fax: [phone]   RxID:   [id number]    Orders Added: 1)  Venipuncture [18563] 2)  Radiology Referral [Radiology] 3)  Est. Patient Level III [14970] 4)  TLB-BMP (Basic Metabolic Panel-BMET) [80048-METABOL] 5)  TLB-CBC Platelet - w/Differential [85025-CBCD] 6)  TLB-Hepatic/Liver Function Pnl [80076-HEPATIC] 7)  Est. Patient Level III [26378]

## 2010-09-01 NOTE — Progress Notes (Signed)
Summary: EGD ?'s   Phone Note Call from Patient Call back at [phone]   [name]: Patient Call For: Dr. [name] Reason for Call: Talk to Nurse Summary of Call: EGD ?'s Initial call taken by: Vallarie Mare,  May 09, 2010 3:12 PM  Follow-up for Phone Call        I have left a message for the patient to call back. Dottie Nelson-Smith CMA Duncan Dull)  May 10, 2010 8:23 AM   Patient called back and states that she knows she needs an endoscopy for follow up of Barrett's esophagus but wants the Dr to know that she is having difficulty swallowing at times. She would like to know if she needs to see Dr in office first. Dr [name], would you like me to schedule direct ENDO w/possible dil or do you want to see her in the office first to discuss symptoms?  (of note: Pt can be reached at [phone] around 4:15 pm) Follow-up by: Lamona Curl CMA Duncan Dull),  May 11, 2010 4:33 PM  Additional Follow-up for Phone Call Additional follow up Details #1::        last EGD/dil 6/ 2009, OK to schedule direct EGD/dil. She is not on Coumadin. Additional Follow-up by: Hart Carwin MD,  May 11, 2010 9:57 PM    Additional Follow-up for Phone Call Additional follow up Details #2::    I have spoken to patient and have scheduled a direct endoscopy with dilation as well as a previsit. Follow-up by: Lamona Curl CMA Duncan Dull),  May 12, 2010 4:26 PM

## 2010-09-23 ENCOUNTER — Encounter: Payer: Self-pay | Admitting: Family Medicine

## 2010-09-30 ENCOUNTER — Telehealth: Payer: Self-pay | Admitting: Internal Medicine

## 2010-10-05 ENCOUNTER — Telehealth (INDEPENDENT_AMBULATORY_CARE_PROVIDER_SITE_OTHER): Payer: Self-pay | Admitting: *Deleted

## 2010-10-06 NOTE — Medication Information (Signed)
Summary: Prescriber Response Form  Prescriber Response Form   Imported By: Maryln Gottron 09/27/2010 15:42:14  _____________________________________________________________________  External Attachment:    Type:   Image     Comment:   External Document

## 2010-10-11 NOTE — Progress Notes (Signed)
Summary: refill  Phone Note Refill Request Message from:  Fax from Pharmacy on October 05, 2010 8:52 AM  Refills Requested: Medication #1:  LIPITOR 40 MG TABS 1 by mouth at bedtime [facility] [address] [facility] - [id number]  Initial call taken by: Okey Regal [place],  October 05, 2010 8:53 AM    New/Updated Medications: LIPITOR 40 MG TABS (ATORVASTATIN CALCIUM) 1 by mouth at bedtime**Office Visit Due with Labs NOW* Prescriptions: LIPITOR 40 MG TABS (ATORVASTATIN CALCIUM) 1 by mouth at bedtime**Office Visit Due with Labs NOW*  #30 x 0   Entered by:   Almeta Monas CMA (AAMA)   Authorized by:   Loreen Freud DO   Signed by:   Almeta Monas CMA (AAMA) on 10/05/2010   Method used:   [facility]ed to ...       [facility]  [address] [phone]* (retail)       [address]       [place], [place]  [address]       Ph: [phone] or [phone]       Fax: [phone]   RxID:   [id number]

## 2010-10-11 NOTE — Progress Notes (Signed)
Summary: triage  Medications Added CARAFATE 1 GM/10ML SUSP (SUCRALFATE) 10 cc by mouth QID x 1 week then two times a day       Phone Note Call from Patient Call back at Home Phone [phone]   [name]: Patient Call For: Dr [name] Reason for Call: Talk to Nurse Summary of Call: Patient states that she's having trouble with her esophagus. Initial call taken by: Tawni Levy,  September 30, 2010 1:19 PM  Follow-up for Phone Call        Patient calling to report for the last week, she has been having food get "stuck in my throat off and on." States her throat feels swollen and sometimes after she eats she vomits. Patient states she had been off her Nexium for about a week a month ago due to cost but she has been taking it two times a day for about a month. She is also taking Zantac daily. Last EGD 05/17/10. Please, advise. Follow-up by: Jesse Fall RN,  September 30, 2010 2:37 PM  Additional Follow-up for Phone Call Additional follow up Details #1::        Message left for patient to call back. Jesse Fall, RN 10/03/10 10:18 AM  Additional Follow-up by: Jesse Fall RN,  October 04, 2010 4:42 PM    Additional Follow-up for Phone Call Additional follow up Details #2::    please add Carafate slurry 10cc by mouth qid x 1 week, then two times a day.Continue Nexium 40 mg two times a day..., if she runs out call in Prilosec 40 mg by mouth two times a day, it may be cheaper. If no improvement in next 4 weeks, consider repeat EGD with dilation Follow-up by: Hart Carwin MD,  October 01, 2010 2:01 PM  Additional Follow-up for Phone Call Additional follow up Details #3:: Details for Additional Follow-up Action Taken: Message left for patient to call back. Message left for patient to cal back. Jesse Fall, 10/04/10 10:22 AM Spoke with patient and gave her Dr. Regino Schultze recommendations. She has enough Nexium for now. she will call in 4 weeks with an update. Additional Follow-up by: Jesse Fall  RN,  October 03, 2010 11:34 AM  New/Updated Medications: CARAFATE 1 GM/10ML SUSP (SUCRALFATE) 10 cc by mouth QID x 1 week then two times a day Prescriptions: CARAFATE 1 GM/10ML SUSP (SUCRALFATE) 10 cc by mouth QID x 1 week then two times a day  #12 oz x 1   Entered by:   Jesse Fall RN   Authorized by:   Hart Carwin MD   Signed by:   Jesse Fall RN on 10/04/2010   Method used:   [facility]  [address] [phone]* (retail)       [address]       [place], [place]  [address]       Ph: [phone] or [phone]       Fax: [phone]   RxID:   [id number]

## 2010-11-08 ENCOUNTER — Telehealth: Payer: Self-pay | Admitting: *Deleted

## 2010-11-08 NOTE — Telephone Encounter (Signed)
Message copied by Jesse Fall on Tue Nov 08, 2010  9:28 AM ------      Message from: Jesse Fall      Created: Wed Oct 05, 2010 11:17 AM       Call and see how patient is doing.

## 2010-11-08 NOTE — Telephone Encounter (Signed)
Opened in error

## 2010-11-10 LAB — CBC
HCT: 28.3 % — ABNORMAL LOW (ref 36.0–46.0)
HCT: 38.3 % (ref 36.0–46.0)
MCHC: 32.9 g/dL (ref 30.0–36.0)
MCHC: 32.9 g/dL (ref 30.0–36.0)
MCV: 90.6 fL (ref 78.0–100.0)
MCV: 91 fL (ref 78.0–100.0)
Platelets: 154 10*3/uL (ref 150–400)
Platelets: 213 10*3/uL (ref 150–400)
RBC: 3.1 MIL/uL — ABNORMAL LOW (ref 3.87–5.11)
RBC: 3.12 MIL/uL — ABNORMAL LOW (ref 3.87–5.11)
RDW: 17.2 % — ABNORMAL HIGH (ref 11.5–15.5)
RDW: 17.5 % — ABNORMAL HIGH (ref 11.5–15.5)
WBC: 6 10*3/uL (ref 4.0–10.5)
WBC: 8.1 10*3/uL (ref 4.0–10.5)
WBC: 9.5 10*3/uL (ref 4.0–10.5)

## 2010-11-10 LAB — COMPREHENSIVE METABOLIC PANEL
AST: 19 U/L (ref 0–37)
Albumin: 4.1 g/dL (ref 3.5–5.2)
BUN: 13 mg/dL (ref 6–23)
Calcium: 9 mg/dL (ref 8.4–10.5)
Chloride: 100 mEq/L (ref 96–112)
Creatinine, Ser: 0.83 mg/dL (ref 0.4–1.2)
GFR calc Af Amer: >60 mL/min (ref >60)
Total Bilirubin: 0.7 mg/dL (ref 0.3–1.2)

## 2010-11-10 LAB — URINALYSIS, ROUTINE W REFLEX MICROSCOPIC
Bilirubin Urine: NEGATIVE
Hgb urine dipstick: NEGATIVE
Ketones, ur: NEGATIVE mg/dL
Protein, ur: NEGATIVE mg/dL
Urobilinogen, UA: 0.2 mg/dL (ref 0.0–1.0)

## 2010-11-10 LAB — BASIC METABOLIC PANEL
BUN: 6 mg/dL (ref 6–23)
CO2: 28 mEq/L (ref 19–32)
Calcium: 7.8 mg/dL — ABNORMAL LOW (ref 8.4–10.5)
Creatinine, Ser: 0.61 mg/dL (ref 0.4–1.2)
Glucose, Bld: 165 mg/dL — ABNORMAL HIGH (ref 70–99)

## 2010-11-10 LAB — DIFFERENTIAL
Basophils Absolute: 0 10*3/uL (ref 0.0–0.1)
Lymphocytes Relative: 17 % (ref 12–46)
Lymphs Abs: 1.6 10*3/uL (ref 0.7–4.0)
Monocytes Absolute: 0.5 10*3/uL (ref 0.1–1.0)
Neutro Abs: 7.3 10*3/uL (ref 1.7–7.7)

## 2010-11-10 LAB — PROTIME-INR: Prothrombin Time: 13.2 seconds (ref 11.6–15.2)

## 2010-11-10 LAB — TYPE AND SCREEN: ABO/RH(D): A NEG

## 2010-12-13 NOTE — Assessment & Plan Note (Signed)
[facility]                         GASTROENTEROLOGY OFFICE NOTE   Leah, [name]                   MRN:          [id number]  DATE:02/19/2007                            DOB:          March 06, 1946    PROGRESS NOTE.   Leah [name] is a 65 year old patient of Dr. Victorino Dike who has been  treated for gastroesophageal reflux and irritable bowel syndrome.  She  has a history of esophageal spasm, hyperlipidemia and underwent  laparoscopic cholecystectomy by Dr. Francina Ames in June, 2000.  Last  appointment was 1 year ago due to irritable bowel syndrome.  She has  been on Bentyl 10 mg today.  Patient ran out of the Bentyl last week and  calling this weekend with request for refill.  Since the refill several  days ago patient has taken the Bentyl two or three times a day without  much relief of her left upper quadrant abdominal pain.  Her bowel habits  have been regular.  She is under a great deal of stress.  Her workout is  very irregular.  Patient works a 7 p.m. to 7 a.m. 12-hour shift three  times a week and in between has a normal schedule.  There has been a  weight loss of about 5 pounds this last year.  Her bowel habits are at  times constipated.  She denies rectal bleeding.  Last colonoscopy was  done in June, 2000 and was a normal exam.   MEDICATIONS:  1. Nexium 40 mg daily.  2. Vivelle patch.  3. Dicyclomine 10 mg p.o. t.i.d. before meals   PHYSICAL EXAMINATION:  Blood pressure 110/70, temperature 98.2, weight  121 pounds.  She was alert and oriented, in no distress.  LUNGS:  Clear to auscultation.  COR:  Normal S1, normal S2.  ABDOMEN:  Soft, very tender in the left upper quadrant under the left  costal margin.  I can feel palpable colon there.  No rebound, no  pulsating mass.  Tenderness extends to the left middle quadrant but not  all the way down.  Right upper and lower quadrant were unremarkable.  RECTAL:  Normal rectal tone, stool  was heme negative.   IMPRESSION:  1. 65 year-old white female with left upper quadrant      abdominal pain, rule out colon spasm, constipation, colon mass,      rule out pancreatic mass.  2. Status post remote cholecystectomy.  3. Smoker.  4. History of irritable bowel syndrome and stress.   PLAN:  1. Continue Bentyl 10 mg p.o. t.i.d. before meals.  2. Increase Nexium to 40 mg p.o. b.i.d.  3. Add Colace 100 mg daily.  4. Colonoscopy scheduled.  5. Upper abdominal ultrasound.  6. CBC, sed rate and amylase today.     Leah [name]. [name], MD  Electronically Signed    DMB/MedQ  DD: 02/19/2007  DT: 02/19/2007  Job #: [id number]   cc:   Nedra Hai, Dr.

## 2010-12-13 NOTE — H&P (Signed)
Leah [name], Leah [name]            ACCOUNT NO.:  [id number]   MEDICAL RECORD NO.:  [id number]          PATIENT TYPE:  INP   LOCATION:  1527                         FACILITY:  [facility]   PHYSICIAN:  Adolph Pollack, M.D.DATE OF BIRTH:  Oct 18, 1945   DATE OF ADMISSION:  10/05/2008  DATE OF DISCHARGE:                              HISTORY & PHYSICAL   REASON FOR ADMISSION:  Elective sigmoid colectomy.   HISTORY:  This is a 65 year old female who has had known diverticulosis  and some bouts of diverticulitis.  She continues to have some pain in  the area.  She has a fairly tortuous colon.  She now presents for  elective partial colectomy.  Procedure, risks, and aftercare were  discussed with her preoperatively.   PAST MEDICAL HISTORY:  1. Sigmoid diverticulosis and diverticulitis.  2. Chronic anxiety.  3. Chronic depression.  4. Hiatal hernia with gastroesophageal reflux.  5. Esophageal dysmotility.  6. Hyperlipidemia.  7. Irritable bowel syndrome.   PREVIOUS OPERATIONS:  1. Vaginal hysterectomy.  2. Laparoscopic cholecystectomy.  3. Carpal tunnel release.  4. Vocal cord polypectomy.  5. Tonsillectomy/adenoidectomy.   ALLERGIES:  1. PENICILLIN.  2. SULFA.  3. CIPROFLOXACIN.  4. ZYBAN.   CURRENT MEDICATIONS:  1. Zoloft.  2. Clarithromycin.  3. Lorazepam.  4. Nexium.   SOCIAL HISTORY:  She is married and smokes about a 1/2 pack of  cigarettes a day.  She denies alcohol use.   PHYSICAL EXAM:  GENERAL:  An overweight female, slightly anxious but  very pleasant.  VITAL SIGNS:  Temperature is 97.4.  Blood pressure is 137/89.  Pulse 73.  Respiratory rate 18.  HEENT:  Notable for thrush on the tongue.  NECK:  Supple without masses.  RESPIRATORY:  Breath sounds equal and clear.  Respirations unlabored.  CARDIOVASCULAR:  Regular rate.  Regular rhythm.  No murmur.  ABDOMEN:  Soft with upper abdominal and subumbilical scar.  No masses.  EXTREMITIES:  SCDs hose on.   IMPRESSION:  Sigmoid diverticulosis and diverticulitis.   PLAN:  Laparoscopic-assisted partial colectomy.      Adolph Pollack, M.D.  Electronically Signed     TJR/MEDQ  D:  10/05/2008  T:  10/06/2008  Job:  [id number]

## 2010-12-13 NOTE — Op Note (Signed)
Leah [name], Leah [name]            ACCOUNT NO.:  [id number]   MEDICAL RECORD NO.:  [id number]          PATIENT TYPE:  INP   LOCATION:  1527                         FACILITY:  [facility]   PHYSICIAN:  Adolph Pollack, M.D.DATE OF BIRTH:  Nov 22, 1945   DATE OF PROCEDURE:  10/05/2008  DATE OF DISCHARGE:                               OPERATIVE REPORT   PREOPERATIVE DIAGNOSIS:  Sigmoid diverticulosis and diverticulitis.   POSTOPERATIVE DIAGNOSIS:  Sigmoid diverticulosis and diverticulitis.   PROCEDURE:  Laparoscopic hand assisted sigmoid colectomy with  mobilization of splenic flexure.   SURGEON:  Adolph Pollack, M.D.   ASSISTANT:  Consuello Bossier, MD   ANESTHESIA:  General.   INDICATIONS:  This 66 year old female has had symptomatic diverticulosis  and bouts of diverticulitis in the sigmoid colon area.  She now presents  for elective resection.   TECHNIQUE:  She is brought to the operating room, placed supine on the  operating table and general anesthetic was administered.  She was then  placed in the lithotomy position and a Foley catheter was inserted.  The  hair in the pelvic area was clipped and the abdominal wall and perineal  area were sterilely prepped and draped.   A small incision was made in the left upper quadrant.  Using a 5 mm  OptiView trocar I gained access into the peritoneal cavity and created a  pneumoperitoneum.  The laparoscope was introduced and there was no  underlying bleeding or organ injury.  I subsequently placed a 5 mm  trocar in the supraumbilical region, one in the right lower quadrant,  one in the suprapubic area, and one in the left lower quadrant.   I inspected the sigmoid colon area and noted that the diseased segment  appeared to be in the proximal sigmoid colon.  I mobilized the sigmoid  colon by dividing its lateral attachments and by freeing up some the  omentum from the diseased segment using sharp and blunt dissection.  I  then  continued to mobilize the descending colon sharply by dividing its  lateral peritoneal attachments.  I then went down to the pelvis.  Using  my plane of dissection above the ureter.  I divided some of the  attachments mobilizing the colon down there which appeared to be quite  redundant.  I then began mobilizing some of splenic flexure by  dissecting the omentum free from the colon and partially mobilizing it.   Following this I removed these lower midline trocar and made an  extraction site incision.  I then placed the GelPort here and introduced  my hand and using hand assistance, I was able to for further mobilize  the splenic flexure of the colon all at way to the mid transverse colon  area and drop the descending colon down inferiorly.   Following this I then exteriorized the mid descending to rectosigmoid  junction.  I then resected the sigmoid colon by dividing it at the  sigmoid colon, descending colon junction and at the rectosigmoid  junction with a GIA stapler.  The mesentery was divided with a LigaSure  device.  The distal portion  of the specimen was marked with a suture and  sent off the field.   Following this some mesenteric bleeding was noted and was controlled  with 3-0 silk sutures.  I then put in place the part of the descending  colon in a side-to-side fashion to the rectosigmoid colon junction area  and performed a side-to-side stapled anastomosis using the linear  cutting stapler.  The common defect was closed with a linear non cutting  stapler.  Suture was placed at the anterior crotch area.  The  anastomosis was patent, viable, under no tension and dropped back into  the abdominal cavity.   Following this gloves were changed and I removed the wound protection  device.  I then copiously irrigated out the abdominal cavity and  evacuated fluid.  No bleeding was noted.   Following this I reinsufflated the abdomen, inspected the area after the  hand port been  replaced and evacuated some irrigation fluid, but again  no bleeding was noted.  No evidence of intestinal injury was noted.   I then removed the hand port device and closed the lower midline  extraction site fascial defect with running #1 PDS suture.  All the  trocars were removed.  The trocar skin incisions were closed with 4-0  Monocryl subcuticular stitches.  The extraction site skin incision was  closed with staples.  Sterile dressings were applied.   She tolerated procedure well without any apparent complications and was  taken to recovery room in satisfactory condition.      Adolph Pollack, M.D.  Electronically Signed     TJR/MEDQ  D:  10/05/2008  T:  10/06/2008  Job:  [id number]   cc:   Hedwig Morton. [name], MD  [address]  [place]  [place] [address]

## 2010-12-16 NOTE — Op Note (Signed)
NAMEMEKALA, WINGER            ACCOUNT NO.:  [id number]   MEDICAL RECORD NO.:  [id number]          PATIENT TYPE:  OBV   LOCATION:  2550                         FACILITY:  [facility]   PHYSICIAN:  Hermelinda Medicus, M.D.   DATE OF BIRTH:  May 11, 1946   DATE OF PROCEDURE:  DATE OF DISCHARGE:                               OPERATIVE REPORT   PREOPERATIVE DIAGNOSES:  1. History of sinusitis.  2. History of forty year cigarette smoking.  3. History of bronchitis.  4. 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   POSTOPERATIVE DIAGNOSES:  1. History of sinusitis.  2. History of forty year cigarette smoking.  3. History of bronchitis.  4. 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   OPERATION PERFORMED:  Microlaryngoscopy and true vocal cord stripping  with biopsy right more than left.  The patient is aware of the risks and  gains to her voice and her airway.  She is also severely allergic to  PENICILLIN has an anaphylaxis and is aware that she is going to be on  voice rest postoperatively and she is going to make every effort to  decrease her smoking situation.   SURGEON:  Hermelinda Medicus, M.D.   ANESTHESIA:  General endotracheal with Dr. Krista Blue.   PROCEDURE:  The patient was placed in the supine position under general  endotracheal anesthesia using a #6 tube, the laryngoscope was placed and  her larynx was completely examined under direct vision.  The larynx did  show edematous tissues around the aryepiglottic folds and piriform bases  but no evidence of any malignancy was seen.  The anterior commissure  Jako laryngoscope was then placed looking focusing right on the true  vocal cords and the high power microscope was brought in with a 400 lens  and we viewed the vocal cords under this microscopic evaluation.  Using  this and then using the microlaryngoscopy instruments and the  microlaryngoscopy scissors and biceps forceps, we removed somewhat  irregular bulky edematous vocal cord nodule on the right side which took  up approximately one half of the surface of the vocal cord from mid to  anterior commissure region.   Once this was completed, the left cord was also noted to have a much  smaller area up by the anterior commissure and this was biopsy excised  also using a similar technique.  Once this was completed, the subglottic  area was examined, found to be in good condition.  The scope was then  slowly retracted and again the supraglottic area was visualized and the  scope was then completely retracted again under direct vision looking at  the entire larynx and lateral pharyngeal walls, epiglottis, laryngeal  and lingual surfaces, base of tongue, all found to be clear but did show  some signs of the edema.   Once this was completed, the patient was awakened, tolerated the  procedure well, was doing well in the recovery room, will be kept over  night on a 23 hour observation  because of her respiratory limitations as  well as a very small larynx.   Her follow up will then be in four days and two weeks, four weeks, two  months, three months, six months and a year.           ______________________________  Hermelinda Medicus, M.D.     JC/MEDQ  D:  09/27/2006  T:  09/27/2006  Job:  [id number]   cc:   Konrad Felix

## 2010-12-16 NOTE — H&P (Signed)
Leah [name], Leah [name]            ACCOUNT NO.:  [id number]   MEDICAL RECORD NO.:  [id number]          PATIENT TYPE:  AMB   LOCATION:  SDS                          FACILITY:  [facility]   PHYSICIAN:  Hermelinda Medicus, M.D.   DATE OF BIRTH:  22-Nov-1945   DATE OF ADMISSION:  09/27/2006  DATE OF DISCHARGE:                              HISTORY & PHYSICAL   This patient is a 65 year old female who works as a Glass blower/designer at  [employer] and has smoked for 35 years approximately one pack a day,  she states she is down to one-half pack a day at this time.  She had  sinus surgery under my care in 1993 and has done very well.  I have not  seen her since 2001.  She, however, has come in having been on Biaxin  and Cipro with sinus drainage and sinus pain and she still is having  drainage down the back of her throat and also was hoarse with a cough  and states she has also some elements of bronchitis.  She on her  examination in the office did show some changes in her posterior  nasopharynx and oropharynx and nose with drainage down the back of her  throat, but the most impressive part was the laryngeal evaluation where  she did have bulky, edematous, somewhat irregular right true vocal cord,  area of polypoid change, the left cord did show a little bit of this  change, also anterior commissure.  She is considerably hoarse, it was  obvious she had an element of a cough.  Her neck was completely free of  any thyromegaly or cervical adenopathy and she did, however, have a CT  scan of her neck which showed the right-sided laryngeal nodules were  obvious, but no pathology or enlarged cervical lymphadenopathy was  evident.  She did have a slight thyroid discrete nodule developmental  variant.  She showed some edematous changes in her lungs.  Her CT scan  of her sinuses showed some post surgical changes, but really looked  quite excellent except for the left maxillary sinus which was her area  of complaint and  she has also had two teeth that are dental issues on  that side, but the sinus looked really quite reasonable with no obvious  fluid, a little bit of sinus swelling.   PAST HISTORY:  She is smoking one-half pack a day now she states 40  years.  The surgical history is one of sinus surgery x2 back in 1993,  she had a tonsillectomy, she has had left TMJ surgery, had a  hysterectomy, a cholecystectomy, left carpal tunnel, left foot surgery  and esophageal dilatation x2.  She has had frequent bronchitis, this  left-sided facial pain which was a recurring TMJ issue with a hiatal  hernia.  She has had some interstitial cystitis.  SHE HAS AN ALLERGY TO  PENICILLIN CAUSING ANAPHYLAXIS.   PHYSICAL EXAMINATION:  VITAL SIGNS:  Reveals a blood pressure of 133/85,  respirations 16, heart rate 80, she weighs 56 kilos, she is 5'1.  HEENT:  Her ears are clear.  Tympanic membranes are  clear.  Her nose is  clear of any lesion, ulceration or polyps, but she does show some  drainage down the back of the throat and within her nose secondary to  her sinusitis.  Her larynx is the most impressive of a fairly large  polypoid edematous type of material and the right true vocal cord with a  suggestion of left cord involvement up in the anterior commissure.  NECK:  Free of any thyromegaly, cervical adenopathy or mass.  CHEST:  Clear, no rales, rhonchi or wheezes.  Increased AP diameter,  mildly decreased breath sounds.  CARDIOVASCULAR:  No opening snaps, murmurs or gallops.  EXTREMITIES:  Unremarkable.   Initial diagnosis is that of:  1. 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  2. 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  3. History of bronchitis.  4. History of sinusitis.  5. History of gastroesophageal reflux.  6. History of left TMJ.  7. History of smoking for 40 years.  8. History of sinus surgery x2.  9. Tonsillectomy.  10.Left TMJ surgery.  11.Hysterectomy.  12.Cholecystectomy.  13.Left carpal tunnel surgery.   14.Left foot surgery.  15.Esophageal dilatation x2.   Our plan is to do a microlaryngoscopy and biopsy of both vocal cords and  will keep her overnight as an observation considering her pulmonary and  respiratory status.           ______________________________  Hermelinda Medicus, M.D.     JC/MEDQ  D:  09/27/2006  T:  09/27/2006  Job:  [id number]   cc:   Konrad Felix

## 2010-12-16 NOTE — Discharge Summary (Signed)
Leah [name], Leah [name]            ACCOUNT NO.:  [id number]   MEDICAL RECORD NO.:  [id number]          PATIENT TYPE:  INP   LOCATION:  1527                         FACILITY:  [facility]   PHYSICIAN:  Adolph Pollack, M.D.DATE OF BIRTH:  28-Dec-1945   DATE OF ADMISSION:  10/05/2008  DATE OF DISCHARGE:  10/12/2008                               DISCHARGE SUMMARY   FINAL DIAGNOSIS:  Sigmoid diverticulosis and diverticulitis.   SECONDARY DIAGNOSES:  1. Superficial wound infection.  2. Postoperative ileus.  3. Acute blood loss anemia.  4. Thrush.  5. Anxiety disorder.  6. Chronic depression.  7. Hiatal hernia with gastroesophageal reflux.  8. Esophageal dysmotility.   OPERATION:  Laparoscopic hand-assisted sigmoid colectomy, October 05, 2008.   REASON FOR ADMISSION:  This 65 year old female has had diverticulosis  and intermittent bouts of diverticulitis.  Because of recurring bouts  and pain with a diverticular disease, she now presents for elective  sigmoid colectomy.   HOSPITAL COURSE:  She underwent the above procedure and tolerated it  well.  Was started on a clear liquid diet immediately postop.  She was  noted to have some acute blood loss anemia, but her hemoglobin remained  stable at 9.4.  Her anxiety was treated with her home medications.  Because of her blood loss anemia, I did stop her heparin and kept her on  SCD hose.   Her postop ileus slowly improved.  She is passing gas and diet was  slowly advanced.  She had had some erythema around her incision and  looked like a superficial wound infection.  The lower aspect was opened  and some cloudy fluid drained.  She subsequently was placed on oral  antibiotics and was able to be discharged October 12, 2008.   DISPOSITION:  Discharged to home in satisfactory condition on October 12, 2008.  Will arrange her home health nursing to help her with dressing  changes.  Those are wet-to-dry dressing changes to her lower aspect of  her  wound.  She was given diet and activity instructions, as well as  pain medication and told to resume all her home medications.  She is  given oral clindamycin.  She will follow up in the office in about 1 to  2 weeks.      Adolph Pollack, M.D.  Electronically Signed     TJR/MEDQ  D:  11/03/2008  T:  11/03/2008  Job:  [id number]   cc:   Hedwig Morton. [name], MD  [address]  [place]  [place] [address]

## 2011-01-19 ENCOUNTER — Telehealth: Payer: Self-pay | Admitting: Internal Medicine

## 2011-01-19 ENCOUNTER — Encounter: Payer: Self-pay | Admitting: Internal Medicine

## 2011-01-19 NOTE — Telephone Encounter (Signed)
Work number listed is our number. Neysa Bonito called patient's work number and left a message for patient to call us.

## 2011-01-19 NOTE — Telephone Encounter (Signed)
Error

## 2011-01-19 NOTE — Telephone Encounter (Signed)
Patient calling report brown drainage from rectum, gas, pressure since yesterday and LLQ pain constant cramping pain x 3 days. States she had chills yesterday but did not and has not had fever. Having soft bowel movements. Feels like she needs to have bowel movement and has liquid brown drainage. Denies nausea. Had vomiting 2 days ago. Spoke with Willette Cluster, NP and patient scheduled with her on 01/24/11 at 10:00 AM. Patient understands to call back if fever, chills or worse abd pain.

## 2011-01-24 ENCOUNTER — Ambulatory Visit (INDEPENDENT_AMBULATORY_CARE_PROVIDER_SITE_OTHER): Payer: BC Managed Care – PPO | Admitting: Nurse Practitioner

## 2011-01-24 ENCOUNTER — Other Ambulatory Visit (INDEPENDENT_AMBULATORY_CARE_PROVIDER_SITE_OTHER): Payer: BC Managed Care – PPO

## 2011-01-24 ENCOUNTER — Encounter: Payer: Self-pay | Admitting: Nurse Practitioner

## 2011-01-24 VITALS — BP 126/70 | HR 64 | Ht 61.5 in | Wt 119.8 lb

## 2011-01-24 DIAGNOSIS — R1032 Left lower quadrant pain: Secondary | ICD-10-CM

## 2011-01-24 LAB — CBC WITH DIFFERENTIAL/PLATELET
Eosinophils Relative: 2.5 % (ref 0.0–5.0)
HCT: 40 % (ref 36.0–46.0)
Hemoglobin: 13.5 g/dL (ref 12.0–15.0)
Lymphs Abs: 1.8 10*3/uL (ref 0.7–4.0)
MCV: 90.8 fl (ref 78.0–100.0)
Monocytes Absolute: 0.4 10*3/uL (ref 0.1–1.0)
Neutro Abs: 3.9 10*3/uL (ref 1.4–7.7)
Platelets: 216 10*3/uL (ref 150.0–400.0)
RDW: 13.9 % (ref 11.5–14.6)
WBC: 6.3 10*3/uL (ref 4.5–10.5)

## 2011-01-24 MED ORDER — DOXYCYCLINE HYCLATE 100 MG PO TABS: 100.0000 mg | ORAL_TABLET | Freq: Two times a day (BID) | ORAL | Status: AC

## 2011-01-24 MED ORDER — METRONIDAZOLE 500 MG PO TABS: ORAL_TABLET | ORAL | Status: DC

## 2011-01-24 MED ORDER — ESOMEPRAZOLE MAGNESIUM 40 MG PO CPDR: DELAYED_RELEASE_CAPSULE | ORAL | Status: DC

## 2011-01-24 NOTE — Assessment & Plan Note (Addendum)
Recurrent left lower quadrant pain reminiscent of diverticulitis. Patient had a sigmoid colectomy in 2010 but since that time has had one episode of mild recurrent diverticulitis(CT scan December 2011). Obtain CBC today. Will treat her for presumed recurrent diverticulitis. She has taken Flagyl and Doxycycline in the past so give go with that. Patient will followup with Dr. [name] in a few weeks but in the meantime she will call our office for fever or worsening abdominal pain. We discussed doxycycline and potential for esophageal ulcers if not taken correctly.

## 2011-01-24 NOTE — Patient Instructions (Signed)
Stay on clear liquids, soup broths, jello, tea, garorade etc for 3 days. IF fever or worsening pain call us at [phone]. Call Rene Kocher or Elita Quick.  We sent the prescriptions for the Doxycycline, Flagyl, and the Nexium.

## 2011-01-24 NOTE — Progress Notes (Signed)
Leah [name] [id number] 1946-05-15   HISTORY OR PRESENT ILLNESS : Leah [name] is a 66 year old female known to Dr. [name] for a history of diverticulitis, and Barrett's esophagus.. In March 2010 patient had a sigmoid colectomy for diverticular disease. Patient apparently did fairly well until November 2011 when she had recurrent left lower quadrant pain. A CT scan revealed some thickening of the mucosa near the junction of the descending and proximal sigmoid colon as well as some pericolonic stranding most consistent with mild diverticulitis.  Patient was treated with antibiotics and scheduled for a flexible sigmoidoscopy which revealed an anastomotic ulcer and a widely patent anastomosis. Since then patient has done okay except for some occasional left lower quadrant pain.  Several days ago she recurrent left lower quadrant pain reminiscent of diverticulitis she also noticed some brown rectal discharge. The rectal discharge has resolved but her left lower quadrant pain has remained constant. No fevers.  Current Medications, Allergies, Past Medical History, Past Surgical History, Family History and Social History were reviewed in [employer] record.   PHYSICAL EXAMINATION : General: Well developed  female in no acute distress Head: Normocephalic and atraumatic Eyes:  sclerae anicteric,conjunctive pink. Ears: Normal auditory acuity Mouth: No deformity or lesions Neck: Supple, no masses.  Lungs: Clear throughout to auscultation Heart: Regular rate and rhythm; no murmurs heard Abdomen: Soft, nondistended. Moderate left lower quadrant tenderness. No masses or hepatomegaly noted. Normal bowel sounds Rectal: Not done Musculoskeletal: Symmetrical with no gross deformities  Skin: No lesions on visible extremities Extremities: No edema or deformities noted Neurological: Alert oriented x 4, grossly nonfocal Cervical Nodes:  No significant cervical  adenopathy Psychological:  Alert and cooperative. Normal mood and affect  ASSESSMENT AND PLAN :

## 2011-01-25 NOTE — Progress Notes (Signed)
Agree w/ initial assessment and plans

## 2011-01-30 ENCOUNTER — Telehealth: Payer: Self-pay | Admitting: *Deleted

## 2011-01-30 NOTE — Telephone Encounter (Signed)
Patient notified of labs results and to keep appointment with Dr. [name]

## 2011-01-30 NOTE — Telephone Encounter (Signed)
Message copied by Daphine Deutscher on Mon Jan 30, 2011  1:50 PM ------      Message from: Meredith Pel      Created: Mon Jan 30, 2011  9:08 AM       Rene Kocher, please let patient know labs are normal and double check that she has a follow up appt. Thanks

## 2011-03-03 ENCOUNTER — Ambulatory Visit: Payer: BC Managed Care – PPO | Admitting: Internal Medicine

## 2011-04-09 ENCOUNTER — Telehealth: Payer: Self-pay | Admitting: Internal Medicine

## 2011-04-09 NOTE — Telephone Encounter (Signed)
Pt called to reports 12-18 hours of nausea, vomiting (nonbloody/nonbilious) and peri-umbilical abdominal pain.  Pain is sharp and cramping.  Also notes 7-8 episodes of diarrhea (no BRBPR or melena).  No fever or chills.    Hx is reviewed and she has hx of sigmoid resection, anastomotic ulcer, and was treated for diverticulitis in June 2012.    I have advised that she be eval'ed in the ED today to determine her symptoms. She asks for a Rx by phone, but I have told her I can't do that since I do not know exactly what her current problem/dx is.  She voiced understanding.  She is reluctant to go to the ED.    Plan: Call patient Monday morning to see if she was seen in the ED, and if not potentially she could be worked in the see Dr. [name] or an extender Monday or early in the week.

## 2011-04-10 ENCOUNTER — Encounter: Payer: Self-pay | Admitting: Physician Assistant

## 2011-04-10 ENCOUNTER — Other Ambulatory Visit (INDEPENDENT_AMBULATORY_CARE_PROVIDER_SITE_OTHER): Payer: BC Managed Care – PPO

## 2011-04-10 ENCOUNTER — Ambulatory Visit (INDEPENDENT_AMBULATORY_CARE_PROVIDER_SITE_OTHER): Payer: BC Managed Care – PPO | Admitting: Physician Assistant

## 2011-04-10 VITALS — BP 88/60 | HR 78 | Ht 61.5 in | Wt 116.0 lb

## 2011-04-10 DIAGNOSIS — K5289 Other specified noninfective gastroenteritis and colitis: Secondary | ICD-10-CM

## 2011-04-10 DIAGNOSIS — R112 Nausea with vomiting, unspecified: Secondary | ICD-10-CM

## 2011-04-10 DIAGNOSIS — R197 Diarrhea, unspecified: Secondary | ICD-10-CM

## 2011-04-10 LAB — BASIC METABOLIC PANEL
BUN: 13 mg/dL (ref 6–23)
Chloride: 105 mEq/L (ref 96–112)
GFR: 99.66 mL/min (ref >60.00)
Glucose, Bld: 95 mg/dL (ref 70–99)
Potassium: 4.4 mEq/L (ref 3.5–5.1)
Sodium: 141 mEq/L (ref 135–145)

## 2011-04-10 LAB — CBC WITH DIFFERENTIAL/PLATELET
Basophils Relative: 0.6 % (ref 0.0–3.0)
Eosinophils Relative: 2 % (ref 0.0–5.0)
Lymphocytes Relative: 25 % (ref 12.0–46.0)
MCV: 91.5 fl (ref 78.0–100.0)
Monocytes Absolute: 0.4 10*3/uL (ref 0.1–1.0)
Neutrophils Relative %: 66.1 % (ref 43.0–77.0)
Platelets: 248 10*3/uL (ref 150.0–400.0)
RBC: 4.8 Mil/uL (ref 3.87–5.11)
WBC: 7 10*3/uL (ref 4.5–10.5)

## 2011-04-10 LAB — HIGH SENSITIVITY CRP: CRP, High Sensitivity: 7.66 mg/L — ABNORMAL HIGH (ref 0.000–5.000)

## 2011-04-10 MED ORDER — DICYCLOMINE HCL 10 MG PO CAPS
ORAL_CAPSULE | ORAL | Status: DC
Start: 2011-04-10 — End: 2012-03-29

## 2011-04-10 MED ORDER — PROMETHAZINE HCL 25 MG PO TABS: ORAL_TABLET | ORAL | Status: DC

## 2011-04-10 NOTE — Progress Notes (Addendum)
Subjective:    Patient ID: Leah [name], female    DOB: 12/22/1945, 65 y.o.   MRN: [id number]  HPI Deloyce is a 65 year old white female known to Dr. [name] with history of diverticular disease and Barrett's esophagus. She is status post sigmoid colectomy in March of 2010 4 diverticulitis. This was complicated by a wound infection. She had flexible sigmoidoscopy in November of 2011 when she was complaining of recurrent left lower quadrant pain and was found to have an anastomotic ulcer but widely patent anastomosis. She has had been treated for one episode of diverticulitis since that time.  She comes in today after abrupt onset on Saturday 9/8 with nausea vomiting and diarrhea. She has not noted any melena or hematochezia but states her stools have been no watery" pouring" out. She says on Saturday and Saturday evening she had a bowel movement about every hour this lasted through part of the day yesterday and then seemed to slow late last evening. Along with this she had several episodes of nausea and vomiting and was unable to keep down any by mouth is. She had a low-grade temperature 99.8 and said she felt hot and cold and stayed in bed most of the weekend. She's not had any severe abdominal pain but does that she feels sore in her left side. Today she has not eaten out at the has not had any nausea and no she's still having diarrhea does not is frequent. She's not been on any new medications nor any recent antibiotics and has not had any known infectious exposures. ,   Review of Systems  Constitutional: Positive for chills and appetite change.  HENT: Negative.   Respiratory: Negative.   Cardiovascular: Negative.   Gastrointestinal: Positive for nausea, vomiting, abdominal pain and diarrhea.  Genitourinary: Negative.   Musculoskeletal: Negative.   Skin: Negative.   Neurological: Positive for weakness.  Hematological: Negative.   Psychiatric/Behavioral: Negative.    Outpatient  Prescriptions Prior to Visit  Medication Sig Dispense Refill  . acetaminophen (TYLENOL) 500 MG tablet Take 500 mg by mouth every 6 (six) hours as needed.        Marland Kitchen esomeprazole (NEXIUM) 40 MG capsule Take 40 mg by mouth 2 (two) times daily.        Marland Kitchen estradiol (VIVELLE-DOT) 0.1 MG/24HR Place 1 patch onto the skin 2 (two) times a week.        . ranitidine (ZANTAC) 150 MG capsule Take 150 mg by mouth PC lunch.        . esomeprazole (NEXIUM) 40 MG capsule Take 1 tab 30 min before breakfast and supper.  60 capsule  3  . metroNIDAZOLE (FLAGYL) 500 MG tablet Take 1 tab 3 times daily x 10 days  30 tablet  0       Objective:   Physical Exam Well-developed thin white female in no acute distress, alert and oriented x3, pleasant. Blood pressure 88/60 pulse 70. HEENT; not hematocrit normocephalic EOMI PERRLA sclera anicteric,Neck; Supple no JVD ,Cardiovascular; regular rate and rhythm with S1-S2 no murmur rub or gallop, Pulmonary; clear bilaterally, Abdomen; sof,t bowel sounds active she is mildly tender in the left mid quadrant, left lower quadrant there is no guarding or rebound no palpable mass or hepatosplenomegaly, Rectal; not done, Extremities; no clubbing cyanosis or edema, skin; benign, Psych; mood and affect normal and appropriate        Assessment & Plan:  #28  65 year old female with acute illness x48 hours with nausea vomiting diarrhea  chills and low-grade fever, symptomatically improving over the past 12 hours. I suspect she has had an acute gastroenteritis, versus foodborne illness.  Plan; we'll check CBC ,BMET Start Phenergan 12.5-25 mg every 6 hours as needed for nausea Start Bentyl 10 mg by mouth 3 times daily as needed for cramping and spasm Patient is asked to stay home and push fluids, with a very bland pattern-type diet with gradual advancement Patient is given a note to remain off of work through 9/12. Patient asks about an antibiotic however I do not think that is indicated at this  time and do not feel clinically that she has diverticulitis. We will followup per phone with her in 24 hours.  Reviewed and agree with management. Carie Caddy. Pyrtle, MD

## 2011-04-10 NOTE — Telephone Encounter (Signed)
Left a message for patient to call me. 

## 2011-04-10 NOTE — Patient Instructions (Signed)
Please go to the basement level to have your labs drawn.  Go home to rest.  Push fluids. We sent prescriptions for Bentyl and Phenergan to [facility], [place] , [place].   We have given you a work note for today thru Wednesday.   We will be calling you tomorrow the 10th Tuesday to check on you.

## 2011-04-10 NOTE — Telephone Encounter (Signed)
Spoke with patient and she continues to have nausea, vomiting and diarrhea. Scheduled patient to see Mike Gip, PA  At !0:00 AM today.

## 2011-04-11 ENCOUNTER — Telehealth: Payer: Self-pay | Admitting: Physician Assistant

## 2011-04-11 NOTE — Telephone Encounter (Signed)
Please call pt, let her know her labs were normal. If she feels bette, I dont think we need antibiotic-I still think she has a gastroenteritis. Ask her to call back  Tomorrow with update.

## 2011-04-11 NOTE — Telephone Encounter (Signed)
Spoke with patient and she is feeling a little better. She had diarrhea x 2 this AM and her stomach is still sore. She wants to know her labs show that she needs an antibiotic. Please, advise.

## 2011-04-11 NOTE — Telephone Encounter (Signed)
Left a message for patient to call me. 

## 2011-04-11 NOTE — Telephone Encounter (Signed)
Spoke with patient and gave her Amy Esterwood, PA recommendations. She will call tomorrow with update. 

## 2011-04-21 ENCOUNTER — Telehealth: Payer: Self-pay | Admitting: Internal Medicine

## 2011-04-21 NOTE — Telephone Encounter (Signed)
Patient is allergic to Cipro. She has taken Flagyl and Doxycycline in the past. She states she is not having diarrhea, she has not had a stool since Sunday. Even after taking Miralax  For 3 days.

## 2011-04-21 NOTE — Telephone Encounter (Signed)
Cipro 250 mg, #20, 1 po bid, full liquids while having diarrhea, stool C.Diff and culture if possible,but do not hold the antibiotics.

## 2011-04-21 NOTE — Telephone Encounter (Signed)
Patient calling to report that she is hurting in her colon again. States she is having left sided pain below the belly button. States it is like gas but a "discharge come out that is brown but is not stool." Denies cramps just pain and sore. States she feels like she needs to have a bowel movement but can't go. States her last bowel movement was Sunday and she had taken Miralax for 3 days now without relief.Denies nausea. She states "I had this before and they gave me antibiotics."  Hx sigmoid resection, anastomotic ulcer, diverticulitis- June 2012. Last ov 04/10/11 with Amy Esterwood, PA for nausea, vomiting, diarrhea, acute gastroeneritis. Please, advise.

## 2011-04-21 NOTE — Telephone Encounter (Signed)
Reviewed with Mike Gip, PA. No Cipro or antibiotics yet. Use Magnesium Citrate or Miralax one dose every hour x 5 or until hs good bowel movement. If still has pain after this or if has fever call us back.

## 2011-04-21 NOTE — Telephone Encounter (Signed)
Reviewed and agree with Leah [name]'s assessment, I have not gotten the full story.

## 2011-05-10 ENCOUNTER — Telehealth: Payer: Self-pay | Admitting: Internal Medicine

## 2011-05-10 NOTE — Telephone Encounter (Signed)
Patient states she left FMLA for Dr. [name] to sign. She was told by Medical Records to call and see if they could be stamped since Dr. [name] is not here. Told patient I will check into what is the procedure.

## 2011-05-11 NOTE — Telephone Encounter (Signed)
Left a message for patient that Dr. [name] will need to sign her FMLA

## 2011-09-19 ENCOUNTER — Ambulatory Visit (INDEPENDENT_AMBULATORY_CARE_PROVIDER_SITE_OTHER): Payer: BC Managed Care – PPO | Admitting: Family Medicine

## 2011-09-19 ENCOUNTER — Encounter: Payer: Self-pay | Admitting: Family Medicine

## 2011-09-19 VITALS — BP 132/86 | HR 74 | Temp 98.6°F | Wt 118.2 lb

## 2011-09-19 DIAGNOSIS — I10 Essential (primary) hypertension: Secondary | ICD-10-CM

## 2011-09-19 DIAGNOSIS — R7309 Other abnormal glucose: Secondary | ICD-10-CM

## 2011-09-19 DIAGNOSIS — R739 Hyperglycemia, unspecified: Secondary | ICD-10-CM

## 2011-09-19 DIAGNOSIS — E785 Hyperlipidemia, unspecified: Secondary | ICD-10-CM

## 2011-09-19 LAB — HEPATIC FUNCTION PANEL
Albumin: 4.4 g/dL (ref 3.5–5.2)
Alkaline Phosphatase: 68 U/L (ref 39–117)
Bilirubin, Direct: 0 mg/dL (ref 0.0–0.3)
Total Protein: 7.6 g/dL (ref 6.0–8.3)

## 2011-09-19 LAB — BASIC METABOLIC PANEL
BUN: 10 mg/dL (ref 6–23)
CO2: 26 mEq/L (ref 19–32)
Chloride: 102 mEq/L (ref 96–112)
Potassium: 4.2 mEq/L (ref 3.5–5.1)

## 2011-09-19 LAB — POCT URINALYSIS DIPSTICK
Ketones, UA: NEGATIVE
Protein, UA: NEGATIVE
Spec Grav, UA: >=1.03
Urobilinogen, UA: 0.2
pH, UA: 6

## 2011-09-19 LAB — LIPID PANEL
HDL: 46.1 mg/dL (ref >39.00)
Triglycerides: 127 mg/dL (ref 0.0–149.0)
VLDL: 25.4 mg/dL (ref 0.0–40.0)

## 2011-09-19 LAB — HEMOGLOBIN A1C: Hgb A1c MFr Bld: 6.1 % (ref 4.6–6.5)

## 2011-09-19 MED ORDER — LISINOPRIL 10 MG PO TABS: 10.0000 mg | ORAL_TABLET | Freq: Every day | ORAL | Status: DC

## 2011-09-19 MED ORDER — ATORVASTATIN CALCIUM 40 MG PO TABS: 40.0000 mg | ORAL_TABLET | Freq: Every day | ORAL | Status: DC

## 2011-09-19 NOTE — Patient Instructions (Signed)

## 2011-09-19 NOTE — Progress Notes (Signed)
  Subjective:    Leah [name] is a 66 y.o. female who presents for evaluation of elevated blood pressures. Age at onset of elevated blood pressure:  On and off for many years--has not had a problem for over 20 years. Cardiac symptoms: none. Patient denies: chest pain, chest pressure/discomfort, claudication, exertional chest pressure/discomfort, fatigue, irregular heart beat, lower extremity edema, near-syncope, orthopnea, palpitations, paroxysmal nocturnal dyspnea, syncope and tachypnea. Cardiovascular risk factors: dyslipidemia, family history of premature cardiovascular disease, hypertension and smoking/ tobacco exposure. Use of agents associated with hypertension: none. History of target organ damage: none.  The following portions of the patient's history were reviewed and updated as appropriate: allergies, current medications, past family history, past medical history, past social history, past surgical history and problem list.  Review of Systems Pertinent items are noted in HPI.   Objective:    BP 132/86  Pulse 74  Temp(Src) 98.6 F (37 C) (Oral)  Wt 118 lb 3.2 oz (53.615 kg)  SpO2 94% General appearance: alert, cooperative, appears stated age and no distress Lungs: clear to auscultation bilaterally Heart: regular rate and rhythm, S1, S2 normal, no murmur, click, rub or gallop Extremities: extremities normal, atraumatic, no cyanosis or edema  Cardiographics ECG: not done    Assessment:    Hypertension, stage 1 . Evidence of target organ damage: none.    Plan:    Medication: begin lisinopril. Screening labs for initial evaluation: basic metabolic panel, lipid panel and urinalysis. Screening for causes of secondary hypertension: TSH (thyroid disease). Dietary sodium restriction. Regular aerobic exercise. Follow up: 3 weeks and as needed.

## 2011-10-10 ENCOUNTER — Ambulatory Visit (INDEPENDENT_AMBULATORY_CARE_PROVIDER_SITE_OTHER): Payer: BC Managed Care – PPO | Admitting: Family Medicine

## 2011-10-10 ENCOUNTER — Encounter: Payer: Self-pay | Admitting: Family Medicine

## 2011-10-10 VITALS — BP 120/68 | HR 80 | Temp 99.1°F | Wt 120.0 lb

## 2011-10-10 DIAGNOSIS — I10 Essential (primary) hypertension: Secondary | ICD-10-CM

## 2011-10-10 DIAGNOSIS — E785 Hyperlipidemia, unspecified: Secondary | ICD-10-CM

## 2011-10-10 DIAGNOSIS — D649 Anemia, unspecified: Secondary | ICD-10-CM

## 2011-10-10 DIAGNOSIS — D509 Iron deficiency anemia, unspecified: Secondary | ICD-10-CM

## 2011-10-10 NOTE — Progress Notes (Signed)
  Subjective:    Patient here for follow-up of elevated blood pressure.  She is not exercising and is adherent to a low-salt diet.  Blood pressure is not well controlled at home. Cardiac symptoms: none. Patient denies: chest pain, chest pressure/discomfort, claudication, dyspnea, exertional chest pressure/discomfort, fatigue, irregular heart beat, lower extremity edema, near-syncope, orthopnea, palpitations, paroxysmal nocturnal dyspnea, syncope and tachypnea. Cardiovascular risk factors: dyslipidemia, hypertension, sedentary lifestyle and smoking/ tobacco exposure. Use of agents associated with hypertension: none. History of target organ damage: none.  The following portions of the patient's history were reviewed and updated as appropriate: allergies, current medications, past family history, past medical history, past social history, past surgical history and problem list.  Review of Systems Pertinent items are noted in HPI.     Objective:    BP 120/68  Pulse 80  Temp(Src) 99.1 F (37.3 C) (Oral)  Wt 120 lb (54.432 kg)  SpO2 94% General appearance: alert, cooperative, appears stated age and no distress Neck: no adenopathy, no carotid bruit, no JVD, supple, symmetrical, trachea midline and thyroid not enlarged, symmetric, no tenderness/mass/nodules Lungs: clear to auscultation bilaterally Heart: S1, S2 normal Extremities: extremities normal, atraumatic, no cyanosis or edema    Assessment:    Hypertension, normal blood pressure . Evidence of target organ damage: none.    Plan:    Medication: no change. Dietary sodium restriction. Regular aerobic exercise. Follow up: 3 months and as needed.

## 2011-10-10 NOTE — Assessment & Plan Note (Signed)
Check labs 

## 2011-10-10 NOTE — Patient Instructions (Addendum)
Hypertension As your heart beats, it forces blood through your arteries. This force is your blood pressure. If the pressure is too high, it is called hypertension (HTN) or high blood pressure. HTN is dangerous because you may have it and not know it. High blood pressure may mean that your heart has to work harder to pump blood. Your arteries may be narrow or stiff. The extra work puts you at risk for heart disease, stroke, and other problems.  Blood pressure consists of two numbers, a higher number over a lower, 110/72, for example. It is stated as "110 over 72." The ideal is below 120 for the top number (systolic) and under 80 for the bottom (diastolic). Write down your blood pressure today. You should pay close attention to your blood pressure if you have certain conditions such as:  Heart failure.   Prior heart attack.   Diabetes   Chronic kidney disease.   Prior stroke.   Multiple risk factors for heart disease.  To see if you have HTN, your blood pressure should be measured while you are seated with your arm held at the level of the heart. It should be measured at least twice. A one-time elevated blood pressure reading (especially in the Emergency Department) does not mean that you need treatment. There may be conditions in which the blood pressure is different between your right and left arms. It is important to see your caregiver soon for a recheck. Most people have essential hypertension which means that there is not a specific cause. This type of high blood pressure may be lowered by changing lifestyle factors such as:  Stress.   Smoking.   Lack of exercise.   Excessive weight.   Drug/tobacco/alcohol use.   Eating less salt.  Most people do not have symptoms from high blood pressure until it has caused damage to the body. Effective treatment can often prevent, delay or reduce that damage. TREATMENT  When a cause has been identified, treatment for high blood pressure is  directed at the cause. There are a large number of medications to treat HTN. These fall into several categories, and your caregiver will help you select the medicines that are best for you. Medications may have side effects. You should review side effects with your caregiver. If your blood pressure stays high after you have made lifestyle changes or started on medicines,   Your medication(s) may need to be changed.   Other problems may need to be addressed.   Be certain you understand your prescriptions, and know how and when to take your medicine.   Be sure to follow up with your caregiver within the time frame advised (usually within two weeks) to have your blood pressure rechecked and to review your medications.   If you are taking more than one medicine to lower your blood pressure, make sure you know how and at what times they should be taken. Taking two medicines at the same time can result in blood pressure that is too low.  SEEK IMMEDIATE MEDICAL CARE IF:  You develop a severe headache, blurred or changing vision, or confusion.   You have unusual weakness or numbness, or a faint feeling.   You have severe chest or abdominal pain, vomiting, or breathing problems.  MAKE SURE YOU:   Understand these instructions.   Will watch your condition.   Will get help right away if you are not doing well or get worse.  Document Released: 07/17/2005 Document Revised: 07/06/2011 Document Reviewed:   03/06/2008 Exit[employer] Patient Information 2012 [place], [place].  Smoking Cessation This document explains the best ways for you to quit smoking and new treatments to help. It lists new medicines that can double or triple your chances of quitting and quitting for good. It also considers ways to avoid relapses and concerns you may have about quitting, including weight gain. NICOTINE: A POWERFUL ADDICTION If you have tried to quit smoking, you know how hard it can be. It is hard because nicotine is a  very addictive drug. For some people, it can be as addictive as heroin or cocaine. Usually, people make 2 or 3 tries, or more, before finally being able to quit. Each time you try to quit, you can learn about what helps and what hurts. Quitting takes hard work and a lot of effort, but you can quit smoking. QUITTING SMOKING IS ONE OF THE MOST IMPORTANT THINGS YOU WILL EVER DO.  You will live longer, feel better, and live better.   The impact on your body of quitting smoking is felt almost immediately:   Within 20 minutes, blood pressure decreases. Pulse returns to its normal level.   After 8 hours, carbon monoxide levels in the blood return to normal. Oxygen level increases.   After 24 hours, chance of heart attack starts to decrease. Breath, hair, and body stop smelling like smoke.   After 48 hours, damaged nerve endings begin to recover. Sense of taste and smell improve.   After 72 hours, the body is virtually free of nicotine. Bronchial tubes relax and breathing becomes easier.   After 2 to 12 weeks, lungs can hold more air. Exercise becomes easier and circulation improves.   Quitting will reduce your risk of having a heart attack, stroke, cancer, or lung disease:   After 1 year, the risk of coronary heart disease is cut in half.   After 5 years, the risk of stroke falls to the same as a nonsmoker.   After 10 years, the risk of lung cancer is cut in half and the risk of other cancers decreases significantly.   After 15 years, the risk of coronary heart disease drops, usually to the level of a nonsmoker.   If you are pregnant, quitting smoking will improve your chances of having a healthy baby.   The people you live with, especially your children, will be healthier.   You will have extra money to spend on things other than cigarettes.  FIVE KEYS TO QUITTING Studies have shown that these 5 steps will help you quit smoking and quit for good. You have the best chances of quitting if  you use them together: 1. Get ready.  2. Get support and encouragement.  3. Learn new skills and behaviors.  4. Get medicine to reduce your nicotine addiction and use it correctly.  5. Be prepared for relapse or difficult situations. Be determined to continue trying to quit, even if you do not succeed at first.  1. GET READY  Set a quit date.   Change your environment.   Get rid of ALL cigarettes, ashtrays, matches, and lighters in your home, car, and place of work.   Do not let people smoke in your home.   Review your past attempts to quit. Think about what worked and what did not.   Once you quit, do not smoke. NOT EVEN A PUFF!  2. GET SUPPORT AND ENCOURAGEMENT Studies have shown that you have a better chance of being successful if you have help.  You can get support in many ways.  Tell your family, friends, and coworkers that you are going to quit and need their support. Ask them not to smoke around you.   Talk to your caregivers (doctor, dentist, nurse, pharmacist, psychologist, and/or smoking counselor).   Get individual, group, or telephone counseling and support. The more counseling you have, the better your chances are of quitting. Programs are available at [employer] and health centers. Call your local health department for information about programs in your area.   Spiritual beliefs and practices may help some smokers quit.   Quit meters are Photographer that keep track of quit statistics, such as amount of "quit-time," cigarettes not smoked, and money saved.   Many smokers find one or more of the many self-help books available useful in helping them quit and stay off tobacco.  3. LEARN NEW SKILLS AND BEHAVIORS  Try to distract yourself from urges to smoke. Talk to someone, go for a walk, or occupy your time with a task.   When you first try to quit, change your routine. Take a different route to work. Drink tea instead of coffee.  Eat breakfast in a different place.   Do something to reduce your stress. Take a hot bath, exercise, or read a book.   Plan something enjoyable to do every day. Reward yourself for not smoking.   Explore interactive web-based programs that specialize in helping you quit.  4. GET MEDICINE AND USE IT CORRECTLY Medicines can help you stop smoking and decrease the urge to smoke. Combining medicine with the above behavioral methods and support can quadruple your chances of successfully quitting smoking. The U.S. Food and Drug Administration (FDA) has approved 7 medicines to help you quit smoking. These medicines fall into 3 categories.  Nicotine replacement therapy (delivers nicotine to your body without the negative effects and risks of smoking):   Nicotine gum: Available over-the-counter.   Nicotine lozenges: Available over-the-counter.   Nicotine inhaler: Available by prescription.   Nicotine nasal spray: Available by prescription.   Nicotine skin patches (transdermal): Available by prescription and over-the-counter.   Antidepressant medicine (helps people abstain from smoking, but how this works is unknown):   Bupropion sustained-release (SR) tablets: Available by prescription.   Nicotinic receptor partial agonist (simulates the effect of nicotine in your brain):   Varenicline tartrate tablets: Available by prescription.   Ask your caregiver for advice about which medicines to use and how to use them. Carefully read the information on the package.   Everyone who is trying to quit may benefit from using a medicine. If you are pregnant or trying to become pregnant, nursing an infant, you are under age 11, or you smoke fewer than 10 cigarettes per day, talk to your caregiver before taking any nicotine replacement medicines.   You should stop using a nicotine replacement product and call your caregiver if you experience nausea, dizziness, weakness, vomiting, fast or irregular heartbeat,  mouth problems with the lozenge or gum, or redness or swelling of the skin around the patch that does not go away.   Do not use any other product containing nicotine while using a nicotine replacement product.   Talk to your caregiver before using these products if you have diabetes, heart disease, asthma, stomach ulcers, you had a recent heart attack, you have high blood pressure that is not controlled with medicine, a history of irregular heartbeat, or you have been prescribed medicine to help you  quit smoking.  5. BE PREPARED FOR RELAPSE OR DIFFICULT SITUATIONS  Most relapses occur within the first 3 months after quitting. Do not be discouraged if you start smoking again. Remember, most people try several times before they finally quit.   You may have symptoms of withdrawal because your body is used to nicotine. You may crave cigarettes, be irritable, feel very hungry, cough often, get headaches, or have difficulty concentrating.   The withdrawal symptoms are only temporary. They are strongest when you first quit, but they will go away within 10 to 14 days.  Here are some difficult situations to watch for:  Alcohol. Avoid drinking alcohol. Drinking lowers your chances of successfully quitting.   Caffeine. Try to reduce the amount of caffeine you consume. It also lowers your chances of successfully quitting.   Other smokers. Being around smoking can make you want to smoke. Avoid smokers.   Weight gain. Many smokers will gain weight when they quit, usually less than 10 pounds. Eat a healthy diet and stay active. Do not let weight gain distract you from your main goal, quitting smoking. Some medicines that help you quit smoking may also help delay weight gain. You can always lose the weight gained after you quit.   Bad mood or depression. There are a lot of ways to improve your mood other than smoking.  If you are having problems with any of these situations, talk to your caregiver. SPECIAL  SITUATIONS AND CONDITIONS Studies suggest that everyone can quit smoking. Your situation or condition can give you a special reason to quit.  Pregnant women/new mothers: By quitting, you protect your baby's health and your own.   Hospitalized patients: By quitting, you reduce health problems and help healing.   Heart attack patients: By quitting, you reduce your risk of a second heart attack.   Lung, head, and neck cancer patients: By quitting, you reduce your chance of a second cancer.   Parents of children and adolescents: By quitting, you protect your children from illnesses caused by secondhand smoke.  QUESTIONS TO THINK ABOUT Think about the following questions before you try to stop smoking. You may want to talk about your answers with your caregiver.  Why do you want to quit?   If you tried to quit in the past, what helped and what did not?   What will be the most difficult situations for you after you quit? How will you plan to handle them?   Who can help you through the tough times? Your family? Friends? Caregiver?   What pleasures do you get from smoking? What ways can you still get pleasure if you quit?  Here are some questions to ask your caregiver:  How can you help me to be successful at quitting?   What medicine do you think would be best for me and how should I take it?   What should I do if I need more help?   What is smoking withdrawal like? How can I get information on withdrawal?  Quitting takes hard work and a lot of effort, but you can quit smoking. FOR MORE INFORMATION  Smokefree.gov (http://www.davis-sullivan.com/) provides free, accurate, evidence-based information and professional assistance to help support the immediate and long-term needs of people trying to quit smoking. Document Released: 07/11/2001 Document Revised: 07/06/2011 Document Reviewed: 05/03/2009 [facility] Patient Information 2012 [place], [place].

## 2011-10-10 NOTE — Assessment & Plan Note (Signed)
Check labs con't meds 

## 2011-10-30 ENCOUNTER — Encounter: Payer: Self-pay | Admitting: Family Medicine

## 2011-10-30 ENCOUNTER — Ambulatory Visit (INDEPENDENT_AMBULATORY_CARE_PROVIDER_SITE_OTHER): Payer: BC Managed Care – PPO | Admitting: Family Medicine

## 2011-10-30 ENCOUNTER — Ambulatory Visit (HOSPITAL_BASED_OUTPATIENT_CLINIC_OR_DEPARTMENT_OTHER)
Admission: RE | Admit: 2011-10-30 | Discharge: 2011-10-30 | Disposition: A | Discharge status: Home / Self Care | Payer: BC Managed Care – PPO | Source: Ambulatory Visit | Attending: Family Medicine | Admitting: Family Medicine

## 2011-10-30 VITALS — BP 120/80 | HR 65 | Temp 98.3°F | Wt 123.0 lb

## 2011-10-30 DIAGNOSIS — M25519 Pain in unspecified shoulder: Secondary | ICD-10-CM

## 2011-10-30 DIAGNOSIS — IMO0001 Reserved for inherently not codable concepts without codable children: Secondary | ICD-10-CM

## 2011-10-30 DIAGNOSIS — M898X9 Other specified disorders of bone, unspecified site: Secondary | ICD-10-CM

## 2011-10-30 DIAGNOSIS — M791 Myalgia, unspecified site: Secondary | ICD-10-CM

## 2011-10-30 NOTE — Patient Instructions (Signed)
Shoulder Pain The shoulder is a ball and socket joint. The muscles and tendons (rotator cuff) are what keep the shoulder in its joint and stable. This collection of muscles and tendons holds in the head (ball) of the humerus (upper arm bone) in the fossa (cup) of the scapula (shoulder blade). Today no reason was found for your shoulder pain. Often pain in the shoulder may be treated conservatively with temporary immobilization. For example, holding the shoulder in one place using a sling for rest. Physical therapy may be needed if problems continue. HOME CARE INSTRUCTIONS   Apply ice to the sore area for 15 to 20 minutes, 3 to 4 times per day for the first 2 days. Put the ice in a plastic bag. Place a towel between the bag of ice and your skin.   If you have or were given a shoulder sling and straps, do not remove for as long as directed by your caregiver or until you see a caregiver for a follow-up examination. If you need to remove it to shower or bathe, move your arm as little as possible.   Sleep on several pillows at night to lessen swelling and pain.   Only take over-the-counter or prescription medicines for pain, discomfort, or fever as directed by your caregiver.   Keep any follow-up appointments in order to avoid any type of permanent shoulder disability or chronic pain problems.  SEEK MEDICAL CARE IF:   Pain in your shoulder increases or new pain develops in your arm, hand, or fingers.   Your hand or fingers are colder than your other hand.   You do not obtain pain relief with the medications or your pain becomes worse.  SEEK IMMEDIATE MEDICAL CARE IF:   Your arm, hand, or fingers are numb or tingling.   Your arm, hand, or fingers are swollen, painful, or turn white or blue.   You develop chest pain or shortness of breath.  MAKE SURE YOU:   Understand these instructions.   Will watch your condition.   Will get help right away if you are not doing well or get worse.    Document Released: 04/26/2005 Document Revised: 07/06/2011 Document Reviewed: 07/01/2011 [employer] Patient Information 2012 [employer]. 

## 2011-10-30 NOTE — Progress Notes (Signed)
  Subjective:    Leah [name] is a 66 y.o. female who presents with left shoulder pain. The symptoms began a week ago. Aggravating factors: no known event. Pain is located around the acromioclavicular [facility]) joint. Discomfort is described as aching and sharp/stabbing. Symptoms are exacerbated by nothing in particular. Evaluation to date: none. Therapy to date includes: ice, avoidance of offending activity and OTC analgesics which are not very effective.  The following portions of the patient's history were reviewed and updated as appropriate: allergies, current medications, past family history, past medical history, past social history, past surgical history and problem list.  Review of Systems Pertinent items are noted in HPI.   Objective:    BP 120/80  Pulse 65  Temp(Src) 98.3 F (36.8 C) (Oral)  Wt 123 lb (55.792 kg)  SpO2 98% Right shoulder: normal active ROM, no tenderness, no impingement sign  Left shoulder: positive for tenderness over the acromioclavicular joint, full ROM and motor exam normal     Assessment:    Left shoulder pain   Myalgia---  Stop Lipitor and call us in 2 weeks to let us know if its better---check CPK Plan:    Educational material distributed. Reduction in offending activity. Rest, ice, compression, and elevation (RICE) therapy. Plain film x-rays. sling  Ortho if no relief

## 2011-10-31 ENCOUNTER — Telehealth: Payer: Self-pay | Admitting: Family Medicine

## 2011-10-31 DIAGNOSIS — M25519 Pain in unspecified shoulder: Secondary | ICD-10-CM

## 2011-10-31 NOTE — Telephone Encounter (Signed)
Referral put in     KP 

## 2011-10-31 NOTE — Telephone Encounter (Signed)
Patient is calling, states her recent shoulder xray shows bone spur.  Patient states her left shoulder is throbbing in pain, and would like to know if Dr. Laury Axon plans to refer her to anyone for this problem.  Please advise.

## 2011-10-31 NOTE — Telephone Encounter (Signed)
Please advise      KP 

## 2011-10-31 NOTE — Telephone Encounter (Signed)
Refer to ortho.

## 2011-11-01 MED ORDER — TRAMADOL HCL 50 MG PO TABS: 50.0000 mg | ORAL_TABLET | Freq: Four times a day (QID) | ORAL | Status: AC | PRN

## 2011-11-01 NOTE — Telephone Encounter (Signed)
Rx faxed.    KP 

## 2011-11-01 NOTE — Telephone Encounter (Signed)
Ultram 50 mg #30  1 po q6h prn  

## 2011-11-01 NOTE — Telephone Encounter (Signed)
Patient stated she is in pain and she wanted something for the pain, she is taking advil but stated she has an Ulcer and she is concerned about taking this. Please advise KP  Patient uses [facility] [place] [facility]

## 2011-11-01 NOTE — Telephone Encounter (Signed)
Patient made aware that the Rx has been faxed.    KP

## 2011-11-01 NOTE — Telephone Encounter (Signed)
Addended by: Arnette Norris on: 11/01/2011 02:03 PM   Modules accepted: Orders

## 2011-11-07 ENCOUNTER — Other Ambulatory Visit: Payer: Self-pay | Admitting: Family Medicine

## 2011-11-07 DIAGNOSIS — I10 Essential (primary) hypertension: Secondary | ICD-10-CM

## 2011-11-07 MED ORDER — LISINOPRIL 10 MG PO TABS: 10.0000 mg | ORAL_TABLET | Freq: Every day | ORAL | Status: DC

## 2011-11-07 NOTE — Telephone Encounter (Signed)
Refill for Lisinopril 10MG   Qty 90   Last filled 2.19.13 Last instructions Take 1 tablet (10 mg total) by mouth daily

## 2011-11-28 ENCOUNTER — Telehealth: Payer: Self-pay | Admitting: Family Medicine

## 2011-11-28 NOTE — Telephone Encounter (Signed)
Pt called stating Dr. Laury Axon told her to go off Lipitor to see if it would help her leg cramps. The pt notes that since going off the medication she is not having leg cramps anymore. She would like to see what other medication she can take. She will be available Thursday after 4pm. [facility] [place].

## 2011-11-29 NOTE — Telephone Encounter (Signed)
msg left to call the office     KP 

## 2011-11-29 NOTE — Telephone Encounter (Signed)
Please advise      KP 

## 2011-11-29 NOTE — Telephone Encounter (Signed)
We can try crestor 5 mg every other day  Or livalo-- she should check with ins to see which one would be cheaper

## 2011-11-30 MED ORDER — ROSUVASTATIN CALCIUM 5 MG PO TABS: 5.0000 mg | ORAL_TABLET | ORAL | Status: DC

## 2011-11-30 NOTE — Telephone Encounter (Signed)
Pt made aware will check with insurance and give a call to let us know which is cheaper.

## 2011-11-30 NOTE — Telephone Encounter (Signed)
Rx sent, Pt states that crestor is cheaper.

## 2011-12-07 ENCOUNTER — Other Ambulatory Visit: Payer: BC Managed Care – PPO

## 2011-12-21 ENCOUNTER — Encounter: Payer: Self-pay | Admitting: *Deleted

## 2011-12-27 ENCOUNTER — Ambulatory Visit (INDEPENDENT_AMBULATORY_CARE_PROVIDER_SITE_OTHER): Payer: BC Managed Care – PPO | Admitting: Internal Medicine

## 2011-12-27 ENCOUNTER — Encounter: Payer: Self-pay | Admitting: Internal Medicine

## 2011-12-27 DIAGNOSIS — K2270 Barrett's esophagus without dysplasia: Secondary | ICD-10-CM

## 2011-12-27 DIAGNOSIS — R1310 Dysphagia, unspecified: Secondary | ICD-10-CM

## 2011-12-27 MED ORDER — ESOMEPRAZOLE MAGNESIUM 40 MG PO CPDR: 40.0000 mg | DELAYED_RELEASE_CAPSULE | Freq: Two times a day (BID) | ORAL | Status: DC

## 2011-12-27 NOTE — Patient Instructions (Signed)
You have been scheduled for an endoscopy with propofol. Please follow written instructions given to you at your visit today. We have sent the following medications to your pharmacy for you to pick up at your convenience: Nexium CC: Dr Loni Muse

## 2011-12-27 NOTE — Progress Notes (Signed)
Leah [name] [birth year] MRN [id number]   History of Present Illness:  This is a 66 year old white female with a history of gastroesophageal reflux and esophageal stricture who is now having a recurrent solid food dysphagia of 4 weeks duration. She has a history of Barrett's esophagus on an upper endoscopy in 4098,1191, 2003, 2007, 2009 and on her last endoscopy in October 2011. She has been on Nexium 40 mg daily. She has had a 2-3 cm hiatal hernia. On her last upper endoscopy in October 2011, patient was dilated with Savary dilators to 17 mm. She had satisfactory relief of her dysphagia until recently. She continues to smoke. She has a history of  Diverticulitis and is post sigmoid resection for diverticular disease in March 2010.    Past Medical History  Diagnosis Date  . Esophageal spasm   . GERD (gastroesophageal reflux disease)   . Chronic gastritis   . Diverticulosis   . B12 deficiency   . IBS (irritable bowel syndrome)   . Anxiety   . Depression   . Hiatal hernia   . Iron deficiency anemia   . Hyperlipidemia   . Diverticulitis   . Barrett esophagus   . Rosacea    Past Surgical History  Procedure Date  . Tonsillectomy and adenoidectomy   . Tmj arthroplasty   . Vocal cord polypectomy   . Carpal tunnel release 1993    right  . Abdominal hysterectomy   . Cholecystectomy   . Partial colectomy   . Sinus surgery with instatrak     reports that she has been smoking Cigarettes.  She has a 22.5 pack-year smoking history. She has never used smokeless tobacco. She reports that she does not drink alcohol or use illicit drugs. family history includes Colon polyps in her mother; Diabetes in her father; Heart disease in her father; Hypertension in her father; Leukemia in her father; Pancreatic cancer in her father and mother; and Prostate cancer in her father.  There is no history of Colon cancer. Allergies  Allergen Reactions  . Amoxicillin-Pot Clavulanate   . Ciprofloxacin     . Moxifloxacin     REACTION: tongue swell hard to breath  . Penicillins   . Sulfonamide Derivatives         Review of Systems: Positive for cough. Negative for hoarseness. Negative for chest pain positive for shortness of breath  The remainder of the 10 point ROS is negative except as outlined in H&P   Physical Exam: General appearance  Well developed, in no distress. Eyes- non icteric. HEENT nontraumatic, normocephalic. Mouth no lesions, tongue papillated, no cheilosis. Neck supple without adenopathy, thyroid not enlarged, no carotid bruits, no JVD. Lungs Clear to auscultation bilaterally. Cor normal S1, normal S2, regular rhythm, no murmur,  quiet precordium. Abdomen: Soft, nontender. Rectal: Not done. Extremities no pedal edema. Skin no lesions. Neurological alert and oriented x 3. Psychological normal mood and affect.  Assessment and Plan:  Problem #1 Recurrent solid food dysphagia consistent with a recurrent distal esophageal stricture. Her last dilatation was almost 2 years ago. She continues to smoke which probably is a contributing factor in the recurrence of her stricture. We will refill her Nexium and schedule her for upper endoscopy with dilatation.  Problem #2 Barrett's esophagus. We will obtain biopsies from the GE junction at the time of her endoscopy.  Problem #3 History of diverticulitis. Patient is status post sigmoid resection in March 2010. She is doing well from that standpoint.  12/27/2011 [name]

## 2012-01-03 ENCOUNTER — Encounter: Payer: Self-pay | Admitting: Internal Medicine

## 2012-01-03 ENCOUNTER — Ambulatory Visit (AMBULATORY_SURGERY_CENTER): Payer: BC Managed Care – PPO | Admitting: Internal Medicine

## 2012-01-03 VITALS — BP 131/76 | HR 71 | Temp 98.5°F | Resp 20 | Ht 61.0 in | Wt 121.0 lb

## 2012-01-03 DIAGNOSIS — K2270 Barrett's esophagus without dysplasia: Secondary | ICD-10-CM

## 2012-01-03 DIAGNOSIS — K2100 Gastro-esophageal reflux disease with esophagitis, without bleeding: Secondary | ICD-10-CM

## 2012-01-03 DIAGNOSIS — K222 Esophageal obstruction: Secondary | ICD-10-CM

## 2012-01-03 DIAGNOSIS — R1319 Other dysphagia: Secondary | ICD-10-CM

## 2012-01-03 DIAGNOSIS — R1310 Dysphagia, unspecified: Secondary | ICD-10-CM

## 2012-01-03 MED ORDER — SODIUM CHLORIDE 0.9 % IV SOLN: 500.0000 mL | INTRAVENOUS | Status: DC

## 2012-01-03 NOTE — Progress Notes (Signed)
No complaints noted in the recovery room. Maw   

## 2012-01-03 NOTE — Patient Instructions (Signed)
Handouts were given on barrett's, dilatation diet, GERD and hiatal hernia.  Please follow the dilatation diet the rest of the day.  You may resume your prior medications today.  Please call if any questions or concerns.    Continue nexium 40 mg per Dr. [name].    YOU HAD AN ENDOSCOPIC PROCEDURE TODAY AT THE [facility]: Refer to the procedure report that was given to you for any specific questions about what was found during the examination.  If the procedure report does not answer your questions, please call your gastroenterologist to clarify.  If you requested that your care partner not be given the details of your procedure findings, then the procedure report has been included in a sealed envelope for you to review at your convenience later.  YOU SHOULD EXPECT: Some feelings of bloating in the abdomen. Passage of more gas than usual.  Walking can help get rid of the air that was put into your GI tract during the procedure and reduce the bloating. If you had a lower endoscopy (such as a colonoscopy or flexible sigmoidoscopy) you may notice spotting of blood in your stool or on the toilet paper. If you underwent a bowel prep for your procedure, then you may not have a normal bowel movement for a few days.  DIET:    Drink plenty of fluids but you should avoid alcoholic beverages for 24 hours.  Please follow the dilatation diet the rest of the day.  ACTIVITY: Your care partner should take you home directly after the procedure.  You should plan to take it easy, moving slowly for the rest of the day.  You can resume normal activity the day after the procedure however you should NOT DRIVE or use heavy machinery for 24 hours (because of the sedation medicines used during the test).    SYMPTOMS TO REPORT IMMEDIATELY: A gastroenterologist can be reached at any hour.  During normal business hours, 8:30 AM to 5:00 PM Monday through Friday, call [phone].  After hours and on weekends, please  call the GI answering service at [phone] who will take a message and have the physician on call contact you.     Following upper endoscopy (EGD)  Vomiting of blood or coffee ground material  New chest pain or pain under the shoulder blades  Painful or persistently difficult swallowing  New shortness of breath  Fever of 100F or higher  Black, tarry-looking stools  FOLLOW UP: If any biopsies were taken you will be contacted by phone or by letter within the next 1-3 weeks.  Call your gastroenterologist if you have not heard about the biopsies in 3 weeks.  Our staff will call the home number listed on your records the next business day following your procedure to check on you and address any questions or concerns that you may have at that time regarding the information given to you following your procedure. This is a courtesy call and so if there is no answer at the home number and we have not heard from you through the emergency physician on call, we will assume that you have returned to your regular daily activities without incident.  SIGNATURES/CONFIDENTIALITY: You and/or your care partner have signed paperwork which will be entered into your electronic medical record.  These signatures attest to the fact that that the information above on your After Visit Summary has been reviewed and is understood.  Full responsibility of the confidentiality of this discharge information lies with  you and/or your care-partner.

## 2012-01-03 NOTE — Op Note (Signed)
[facility] [address] [place], [place]  [address]  ENDOSCOPY PROCEDURE REPORT  PATIENT:  Leah, [name]  MR#:  [id number] BIRTHDATE:  October 11, 1945, 65 yrs. old  GENDER:  female  ENDOSCOPIST:  Hedwig Morton. [name], MD Referred by:  Loreen Freud, DO  PROCEDURE DATE:  01/03/2012 PROCEDURE:  EGD with biopsy, 62130, EGD with Savory dilation over a guidewire ASA CLASS:  Class II INDICATIONS:  h/o Barrett's Esophagus, dysphagia hx es. stricture, dilated in 8657,8469, 2003,2009,2011, hx of Barrett's esophagus 04/2010  MEDICATIONS:   MAC sedation, administered by CRNA, propofol (Diprivan) 150 mg TOPICAL ANESTHETIC:  Cetacaine Spray  DESCRIPTION OF PROCEDURE:   After the risks benefits and alternatives of the procedure were thoroughly explained, informed consent was obtained.  The LB GIF-H180 [id number] endoscope was introduced through the mouth and advanced to the second portion of the duodenum, without limitations.  The instrument was slowly withdrawn as the mucosa was fully examined. <<PROCEDUREIMAGES>>  A stricture was found. benign appearing, fibrous stricture at g-e- junction With standard forceps, a biopsy was obtained and sent to pathology (see image1, image2, and image6). Savary dilation over a guidewire 14,15,16,17,54mm dilators passed,small amount of blood on the last dilator  A hiatal hernia was found (see image5 and image4).  Otherwise the examination was normal (see image3). Retroflexed views revealed no abnormalities.    The scope was then withdrawn from the patient and the procedure completed.  COMPLICATIONS:  None  ENDOSCOPIC IMPRESSION: 1) Stricture 2) Hiatal hernia 3) Otherwise normal examination moderate es. stricture dilated to 18 mm RECOMMENDATIONS: 1) Anti-reflux regimen to be follow continue Nexiem 40 mg  REPEAT EXAM:  In 0 year(s) for.  redilate as necessary  ______________________________ Hedwig Morton. [name], MD  CC:  n. eSIGNED:   Hedwig Morton.  See Beharry at 01/03/2012 02:07 PM  [name], [phone]

## 2012-01-03 NOTE — Progress Notes (Signed)
Patient did not experience any of the following events: a burn prior to discharge; a fall within the facility; wrong site/side/patient/procedure/implant event; or a hospital transfer or hospital admission upon discharge from the facility. (G8907) Patient did not have preoperative order for IV antibiotic SSI prophylaxis. (G8918)  

## 2012-01-04 ENCOUNTER — Telehealth: Payer: Self-pay

## 2012-01-04 NOTE — Telephone Encounter (Signed)
Left message on answering machine. 

## 2012-01-09 ENCOUNTER — Encounter: Payer: Self-pay | Admitting: Internal Medicine

## 2012-03-25 ENCOUNTER — Telehealth: Payer: Self-pay | Admitting: Internal Medicine

## 2012-03-25 NOTE — Telephone Encounter (Signed)
Left a message for patient to call me. 

## 2012-03-25 NOTE — Telephone Encounter (Signed)
Left a message for a patient to call me. 

## 2012-03-26 NOTE — Telephone Encounter (Signed)
Left a message for patient to call me. 

## 2012-03-27 NOTE — Telephone Encounter (Signed)
Spoke with patient and she is reporting left side abdominal pain at belly button and below x 1 week. Denies nausea, vomiting, diarrhea, constipation or fever. She is taking Dicyclomine. Hx diverticulitis, s/p sigmoid resection for diverticular disease March 2010. Allergic to Cipro, PCN, Sulfa,Amoxicillin and Moxifloxacin.  Please, advise.

## 2012-03-27 NOTE — Telephone Encounter (Signed)
Needs office evaluation

## 2012-03-27 NOTE — Telephone Encounter (Signed)
Left a message for patient to call me. 

## 2012-03-27 NOTE — Telephone Encounter (Signed)
Patient called and left a message to call her at home number at 4:15 PM .

## 2012-03-28 NOTE — Telephone Encounter (Signed)
Patient returned my call. Scheduled her tomorrow at 3:00 PM with Dr. Arlyce Dice.

## 2012-03-28 NOTE — Telephone Encounter (Signed)
Left a message for patient to call me to schedule OV at home and cell number.

## 2012-03-29 ENCOUNTER — Encounter: Payer: Self-pay | Admitting: Gastroenterology

## 2012-03-29 ENCOUNTER — Ambulatory Visit (INDEPENDENT_AMBULATORY_CARE_PROVIDER_SITE_OTHER): Payer: BC Managed Care – PPO | Admitting: Gastroenterology

## 2012-03-29 VITALS — BP 128/72 | HR 60 | Ht 61.0 in | Wt 122.0 lb

## 2012-03-29 DIAGNOSIS — R1310 Dysphagia, unspecified: Secondary | ICD-10-CM

## 2012-03-29 DIAGNOSIS — R1032 Left lower quadrant pain: Secondary | ICD-10-CM

## 2012-03-29 MED ORDER — DOXYCYCLINE HYCLATE 100 MG PO TABS: 100.0000 mg | ORAL_TABLET | Freq: Two times a day (BID) | ORAL | Status: AC

## 2012-03-29 MED ORDER — METRONIDAZOLE 250 MG PO TABS: 250.0000 mg | ORAL_TABLET | Freq: Three times a day (TID) | ORAL | Status: AC

## 2012-03-29 NOTE — Assessment & Plan Note (Signed)
Improved following esophageal dilatation 

## 2012-03-29 NOTE — Assessment & Plan Note (Addendum)
The patient has recurrent left lower quadrant pain reminiscent of her prior pain secondary to diverticulitis. I  suspect that she has a recurrent infection.  Recommendations #1 begin Flagyl 250 mg 3 times a day and doxycycline 100 mg twice a day

## 2012-03-29 NOTE — Progress Notes (Signed)
History of Present Illness:  Pleasant 66 year old white female with history of diverticulitis, status post sigmoid resection, with recurrence approximately one year ago, here because of abdominal pain. For the last 5 days she's had pain reminiscent of her previous left lower quadrant pain associated with diverticulitis. This was demonstrated by CT scan in December, 2011. She denies fever or diarrhea. Since her last visit she underwent dilatation of a distal esophageal stricture. She denies dysphagia.    Review of Systems: Pertinent positive and negative review of systems were noted in the above HPI section. All other review of systems were otherwise negative.    Current Medications, Allergies, Past Medical History, Past Surgical History, Family History and Social History were reviewed in [employer] electronic medical record  Vital signs were reviewed in today's medical record. Physical Exam: General: Well developed , well nourished, no acute distress Head: Normocephalic and atraumatic Eyes:  sclerae anicteric, EOMI Ears: Normal auditory acuity Mouth: No deformity or lesions Lungs: Clear throughout to auscultation Heart: Regular rate and rhythm; no murmurs, rubs or bruits Abdomen: Soft,and non distended. No masses, hepatosplenomegaly or hernias noted. Normal Bowel sounds. There is moderate tenderness in the left lower quadrant without guarding or rebound Rectal:deferred Musculoskeletal: Symmetrical with no gross deformities  Pulses:  Normal pulses noted Extremities: No clubbing, cyanosis, edema or deformities noted Neurological: Alert oriented x 4, grossly nonfocal Psychological:  Alert and cooperative. Normal mood and affect

## 2012-04-25 ENCOUNTER — Telehealth: Payer: Self-pay | Admitting: Internal Medicine

## 2012-04-26 NOTE — Telephone Encounter (Signed)
The phone number listed for cell is incorrect. Left a message for patient to call me at her home number.

## 2012-04-26 NOTE — Telephone Encounter (Signed)
Patient returned my call. She states she started having left lower abdominal pain again 2 days ago.(She saw Dr. Arlyce Dice on 03/29/12 for same type of pain. She took 10 days of Flagyl and Doxicycline) She states she has been constipated and had mucous in stools. She had a low grade temp of 99.1. Denies nausea or vomiting. Her appetite is okay. She tried Bentyl and it helped a little.Hx diverticulitis. Allergy to : Cipro, PCN, Sulfa. Please, advise.

## 2012-04-29 NOTE — Telephone Encounter (Signed)
Try Miralax 17 gm qd x3, then prn

## 2012-04-30 NOTE — Telephone Encounter (Signed)
Left a message for patient to call me. 

## 2012-05-01 NOTE — Telephone Encounter (Signed)
Left a message for patient to call me. 

## 2012-05-02 NOTE — Telephone Encounter (Signed)
Left a message for patient to call me. 

## 2012-05-03 NOTE — Telephone Encounter (Signed)
Unable to reach patient at her number and patient has not returned my calls.

## 2012-05-07 ENCOUNTER — Ambulatory Visit: Payer: BC Managed Care – PPO | Admitting: Internal Medicine

## 2012-06-26 ENCOUNTER — Ambulatory Visit: Payer: BC Managed Care – PPO | Admitting: Internal Medicine

## 2012-09-02 ENCOUNTER — Telehealth: Payer: Self-pay | Admitting: Internal Medicine

## 2012-09-02 NOTE — Telephone Encounter (Signed)
Patient states she is having increased belching and some difficulty swallowing. States her "hernia is acting up." States she is hurting in her back and shoulder blades. Explained to patient that this is not usual GI type pain. Scheduled her with Willette Cluster, NP on 09/03/12 at 2:00 PM per patient request.

## 2012-09-03 ENCOUNTER — Encounter: Payer: Self-pay | Admitting: Nurse Practitioner

## 2012-09-03 ENCOUNTER — Ambulatory Visit (INDEPENDENT_AMBULATORY_CARE_PROVIDER_SITE_OTHER): Payer: BC Managed Care – PPO | Admitting: Nurse Practitioner

## 2012-09-03 VITALS — BP 120/80 | HR 78 | Ht 61.5 in | Wt 123.9 lb

## 2012-09-03 DIAGNOSIS — R1013 Epigastric pain: Secondary | ICD-10-CM | POA: Insufficient documentation

## 2012-09-03 DIAGNOSIS — R1310 Dysphagia, unspecified: Secondary | ICD-10-CM

## 2012-09-03 NOTE — Progress Notes (Signed)
09/03/2012 Leah [name] [id number] 1945-12-18   History of Present Illness:   Patient is a 67 year old female known to Dr. [name] for history of diverticulitis, status post sigmoid resection. She had recurrent diverticulitis in December 2011. Patient was treated for what was presumed to be diverticulitis again August 2013. Patient also has a history of esophageal strictures requiring multiple endoscopies with dilation. Last EGD June 2013 with findings of a stricture at gastroesophageal junction. Stricture was dilated with savory dilator. She did very well until 2 weeks ago. Patient now belching with some regurgitation of food. Food getting stuck in esophagus again. Patient does have a known history of esophageal dysmotility. Esophagram in 2009 revealed rather severe intermittent tertiary contractions with dysmotility.  Patient also complains of "hiatal hernia" pain over the last week. Pain epigastric, constant, worse with food and radiates through to the back. She has had this pain many times before.  Two weeks ago she increased Nexium to BID but it hasn't helped. She doesn't have a gallbladder, doesn't take NSAIDS   Current Medications, Allergies, Past Medical History, Past Surgical History, Family History and Social History were reviewed in [employer] record.   Physical Exam: General: Well developed , white female in no acute distress Head: Normocephalic and atraumatic Eyes:  sclerae anicteric, conjunctiva pink  Ears: Normal auditory acuity Lungs: Clear throughout to auscultation Heart: Regular rate and rhythm Abdomen: Soft, non tender and non distended. No masses, no hepatomegaly. Normal bowel sounds Musculoskeletal: Symmetrical with no gross deformities  Extremities: No edema  Neurological: Alert oriented x 4, grossly nonfocal Psychological:  Alert and cooperative. Normal mood and affect  Assessment and Recommendations:   Recurrent dysphagia. This may be  multifactorial with history of dysmotility and esophageal strictures. For further evaluation and treatment patient will be scheduled for EGD. The benefits, risks, and potential complications of EGD with possible biopsies and/or dilation were discussed with the patient and she agrees to proceed.   2. GERD/ Hx of Barrett's by biopsy 2011. Bbiopsies negative in 2013.   3. Tobacco abuse, not ready to quit  4. Chronic, intermittent epigastric pain of unclear etiology. Suspect functional. Abdominal exam not concerning. Further evaluation at time of EGD

## 2012-09-03 NOTE — Patient Instructions (Addendum)
You have been scheduled for an endoscopy with propofol. Please follow written instructions given to you at your visit today. If you use inhalers (even only as needed) or a CPAP machine, please bring them with you on the day of your procedure.   You have been given a separate informational sheet regarding your tobacco use, the importance of quitting and local resources to help you quit.

## 2012-09-03 NOTE — Progress Notes (Signed)
Reviewed and agree  Unfortunately , her smoking is contributing to the GERD Sx's

## 2012-09-11 ENCOUNTER — Ambulatory Visit (AMBULATORY_SURGERY_CENTER): Payer: BC Managed Care – PPO | Admitting: Internal Medicine

## 2012-09-11 ENCOUNTER — Encounter: Payer: Self-pay | Admitting: Internal Medicine

## 2012-09-11 VITALS — BP 144/81 | HR 76 | Temp 97.2°F | Resp 14 | Ht 61.0 in | Wt 123.0 lb

## 2012-09-11 DIAGNOSIS — K2100 Gastro-esophageal reflux disease with esophagitis, without bleeding: Secondary | ICD-10-CM

## 2012-09-11 DIAGNOSIS — K222 Esophageal obstruction: Secondary | ICD-10-CM

## 2012-09-11 DIAGNOSIS — R1310 Dysphagia, unspecified: Secondary | ICD-10-CM

## 2012-09-11 MED ORDER — SODIUM CHLORIDE 0.9 % IV SOLN: 500.0000 mL | INTRAVENOUS | Status: DC

## 2012-09-11 MED ORDER — SUCRALFATE 1 GM/10ML PO SUSP: 1.0000 g | Freq: Two times a day (BID) | ORAL | Status: DC

## 2012-09-11 NOTE — Progress Notes (Signed)
Lidocaine-40mg IV prior to Propofol InductionPropofol given over incremental dosages 

## 2012-09-11 NOTE — Patient Instructions (Addendum)

## 2012-09-11 NOTE — Progress Notes (Signed)
Patient did not experience any of the following events: a burn prior to discharge; a fall within the facility; wrong site/side/patient/procedure/implant event; or a hospital transfer or hospital admission upon discharge from the facility. (G8907) Patient did not have preoperative order for IV antibiotic SSI prophylaxis. (G8918)  

## 2012-09-11 NOTE — Op Note (Signed)
[facility] [address] [place], [address]   ENDOSCOPY PROCEDURE REPORT  PATIENT: Leah [name], Leah [name]  MR#: [id number] BIRTHDATE: [birth year] , 66  yrs. old GENDER: Female ENDOSCOPIST: Hart Carwin, MD REFERRED BY:  Loreen Freud, DO PROCEDURE DATE:  09/11/2012 PROCEDURE:  EGD, diagnostic and Savary dilation of esophagus ASA CLASS:     Class II INDICATIONS:  Dysphagia.   Epigastric pain.   hx esophageal stricture and dismotility, last dilation 12/2011, Barrett's in 2011, not confirmed in 2013. MEDICATIONS: MAC sedation, administered by CRNA and propofol (Diprivan) 150mg  IV TOPICAL ANESTHETIC: Cetacaine Spray  DESCRIPTION OF PROCEDURE: After the risks benefits and alternatives of the procedure were thoroughly explained, informed consent was obtained.  The LB GIF-H180 [id number] endoscope was introduced through the mouth and advanced to the second portion of the duodenum. Without limitations.  The instrument was slowly withdrawn as the mucosa was fully examined.      Esophagus: esophageal lumen was rather torturous but there was no retained food or secretions in the esophagus. There was a mild esophageal stricture at the all GE junction which allowed the endoscope to traverse without resistance. The lumen in the distal esophagus was somewhat eccentric and there was a spasm associated with the distal to the GE junction was a 3 cm hiatal hernia which was nonreducible.There was a short less than 5 mm erosion in the GE junction. Savary dilator was passed over the guidewire using , 14, 15, 16, and 17 mm dilators. There was a small amount of blood on the last dilator stomach: Stomach was insufflated with air and it showed normal gastric mucosa room the forceps gastric antrum. Pyloric outlet was unremarkable. Endoscope was retroflexed fundus and cardia of the stomach were examined and confirmed presence of hiatal hernia. Duodenum: Duodenal bulb and descending  duodenum was normal[          The scope was then withdrawn from the patient and the procedure completed.  COMPLICATIONS: There were no complications. ENDOSCOPIC IMPRESSION: esophageal dysmotility consistent with presbyesophagus Nonobstructing esophageal strictures. Status post dilation to 17 mm 3 cm hiatal hernia   RECOMMENDATIONS:   antireflux measures continue Nexium 40 mg by mouth twice a day Carafate slurry 10 cc by mouth twice a day Smoking cessation   REPEAT EXAM: recall EGD prn  eSigned:  Hart Carwin, MD 09/11/2012 9:20 AM   CC:  PATIENT NAME:  Leah [name], Leah [name] MR#: [id number]

## 2012-09-11 NOTE — Progress Notes (Signed)
Called to room to assist during endoscopic procedure.  Patient ID and intended procedure confirmed with present staff. Received instructions for my participation in the procedure from the performing physician.  

## 2012-09-16 ENCOUNTER — Telehealth: Payer: Self-pay | Admitting: *Deleted

## 2012-09-16 NOTE — Telephone Encounter (Signed)
Message left

## 2012-09-17 ENCOUNTER — Telehealth: Payer: Self-pay | Admitting: Family Medicine

## 2012-09-17 MED ORDER — ROSUVASTATIN CALCIUM 5 MG PO TABS: ORAL_TABLET | ORAL | Status: DC

## 2012-09-17 NOTE — Telephone Encounter (Signed)
Refill: Crestor 5 mg tablet #90. Take 1 tablet by mouth every day. Last fill 11-30-11

## 2012-12-18 ENCOUNTER — Telehealth: Payer: Self-pay

## 2012-12-18 ENCOUNTER — Other Ambulatory Visit: Payer: Self-pay | Admitting: Family Medicine

## 2012-12-18 DIAGNOSIS — I10 Essential (primary) hypertension: Secondary | ICD-10-CM

## 2012-12-18 DIAGNOSIS — E785 Hyperlipidemia, unspecified: Secondary | ICD-10-CM

## 2012-12-18 MED ORDER — ROSUVASTATIN CALCIUM 5 MG PO TABS: ORAL_TABLET | ORAL | Status: DC

## 2012-12-18 NOTE — Telephone Encounter (Signed)
Spoke with pt appt made for 5/23. Labs extended in system. Crestor reordered for 30 days.

## 2012-12-18 NOTE — Telephone Encounter (Signed)
Message copied by Arnette Norris on Wed Dec 18, 2012  8:54 AM ------      Message from: Marshell Garfinkel      Created: Tue Dec 17, 2012  4:25 PM      Contact: [phone]       Patient would like know if she needs a lab only appt or OV with Dr. Laury Axon. She states her pharmacy told her we refused her medications until she comes in. Please advise on type of appointment patient needs. ------

## 2012-12-18 NOTE — Telephone Encounter (Signed)
She needs and Office visit with Fasting labs

## 2012-12-18 NOTE — Telephone Encounter (Signed)
msg left to call the office. No med's were denied by [place]. I need to get clarification when the patient calls back.     KP

## 2012-12-20 ENCOUNTER — Ambulatory Visit (INDEPENDENT_AMBULATORY_CARE_PROVIDER_SITE_OTHER): Payer: BC Managed Care – PPO | Admitting: Family Medicine

## 2012-12-20 ENCOUNTER — Encounter: Payer: Self-pay | Admitting: Family Medicine

## 2012-12-20 VITALS — BP 120/72 | HR 70 | Temp 98.7°F | Wt 120.2 lb

## 2012-12-20 DIAGNOSIS — F17200 Nicotine dependence, unspecified, uncomplicated: Secondary | ICD-10-CM

## 2012-12-20 DIAGNOSIS — E785 Hyperlipidemia, unspecified: Secondary | ICD-10-CM

## 2012-12-20 DIAGNOSIS — I10 Essential (primary) hypertension: Secondary | ICD-10-CM

## 2012-12-20 DIAGNOSIS — J069 Acute upper respiratory infection, unspecified: Secondary | ICD-10-CM

## 2012-12-20 LAB — LDL CHOLESTEROL, DIRECT: Direct LDL: 172 mg/dL

## 2012-12-20 LAB — CBC WITH DIFFERENTIAL/PLATELET
Eosinophils Relative: 1.9 % (ref 0.0–5.0)
HCT: 42.8 % (ref 36.0–46.0)
Hemoglobin: 14.2 g/dL (ref 12.0–15.0)
Lymphs Abs: 1.9 10*3/uL (ref 0.7–4.0)
Monocytes Relative: 7.1 % (ref 3.0–12.0)
Neutro Abs: 4.9 10*3/uL (ref 1.4–7.7)
WBC: 7.6 10*3/uL (ref 4.5–10.5)

## 2012-12-20 LAB — BASIC METABOLIC PANEL
GFR: 75.53 mL/min (ref >60.00)
Glucose, Bld: 87 mg/dL (ref 70–99)
Potassium: 4.6 mEq/L (ref 3.5–5.1)
Sodium: 139 mEq/L (ref 135–145)

## 2012-12-20 LAB — HEPATIC FUNCTION PANEL
AST: 13 U/L (ref 0–37)
Albumin: 3.8 g/dL (ref 3.5–5.2)
Alkaline Phosphatase: 57 U/L (ref 39–117)
Total Bilirubin: 0.4 mg/dL (ref 0.3–1.2)

## 2012-12-20 LAB — LIPID PANEL: HDL: 43.2 mg/dL (ref >39.00)

## 2012-12-20 MED ORDER — SIMVASTATIN 20 MG PO TABS: 20.0000 mg | ORAL_TABLET | Freq: Every day | ORAL | Status: DC

## 2012-12-20 NOTE — Assessment & Plan Note (Signed)
con't antihistamine and steroid nasal spray

## 2012-12-20 NOTE — Patient Instructions (Addendum)
Cholesterol Cholesterol is a white, waxy, fat-like protein needed by your body in small amounts. The liver makes all the cholesterol you need. It is carried from the liver by the blood through the blood vessels. Deposits (plaque) may build up on blood vessel walls. This makes the arteries narrower and stiffer. Plaque increases the risk for heart attack and stroke. You cannot feel your cholesterol level even if it is very high. The only way to know is by a blood test to check your lipid (fats) levels. Once you know your cholesterol levels, you should keep a record of the test results. Work with your caregiver to to keep your levels in the desired range. WHAT THE RESULTS MEAN:  Total cholesterol is a rough measure of all the cholesterol in your blood.  LDL is the so-called bad cholesterol. This is the type that deposits cholesterol in the walls of the arteries. You want this level to be low.  HDL is the good cholesterol because it cleans the arteries and carries the LDL away. You want this level to be high.  Triglycerides are fat that the body can either burn for energy or store. High levels are closely linked to heart disease. DESIRED LEVELS:  Total cholesterol below 200.  LDL below 100 for people at risk, below 70 for very high risk.  HDL above 50 is good, above 60 is best.  Triglycerides below 150. HOW TO LOWER YOUR CHOLESTEROL:  Diet.  Choose fish or white meat chicken and [place], roasted or baked. Limit fatty cuts of red meat, fried foods, and processed meats, such as sausage and lunch meat.  Eat lots of fresh fruits and vegetables. Choose whole grains, beans, pasta, potatoes and cereals.  Use only small amounts of olive, corn or canola oils. Avoid butter, mayonnaise, shortening or palm kernel oils. Avoid foods with trans-fats.  Use skim/nonfat milk and low-fat/nonfat yogurt and cheeses. Avoid whole milk, cream, ice cream, egg yolks and cheeses. Healthy desserts include angel food  cake, ginger snaps, animal crackers, hard candy, popsicles, and low-fat/nonfat frozen yogurt. Avoid pastries, cakes, pies and cookies.  Exercise.  A regular program helps decrease LDL and raises HDL.  Helps with weight control.  Do things that increase your activity level like gardening, walking, or taking the stairs.  Medication.  May be prescribed by your caregiver to help lowering cholesterol and the risk for heart disease.  You may need medicine even if your levels are normal if you have several risk factors. HOME CARE INSTRUCTIONS   Follow your diet and exercise programs as suggested by your caregiver.  Take medications as directed.  Have blood work done when your caregiver feels it is necessary. MAKE SURE YOU:   Understand these instructions.  Will watch your condition.  Will get help right away if you are not doing well or get worse. Document Released: 04/11/2001 Document Revised: 10/09/2011 Document Reviewed: 10/02/2007 [facility] Patient Information 2014 [place], [place].   TRy CoQ10 200 mg daily with zocor to help decrease leg cramps   Smoking Cessation Quitting smoking is important to your health and has many advantages. However, it is not always easy to quit since nicotine is a very addictive drug. Often times, people try 3 times or more before being able to quit. This document explains the best ways for you to prepare to quit smoking. Quitting takes hard work and a lot of effort, but you can do it. ADVANTAGES OF QUITTING SMOKING  You will live longer, feel better, and  live better.  Your body will feel the impact of quitting smoking almost immediately.  Within 20 minutes, blood pressure decreases. Your pulse returns to its normal level.  After 8 hours, carbon monoxide levels in the blood return to normal. Your oxygen level increases.  After 24 hours, the chance of having a heart attack starts to decrease. Your breath, hair, and body stop smelling like  smoke.  After 48 hours, damaged nerve endings begin to recover. Your sense of taste and smell improve.  After 72 hours, the body is virtually free of nicotine. Your bronchial tubes relax and breathing becomes easier.  After 2 to 12 weeks, lungs can hold more air. Exercise becomes easier and circulation improves.  The risk of having a heart attack, stroke, cancer, or lung disease is greatly reduced.  After 1 year, the risk of coronary heart disease is cut in half.  After 5 years, the risk of stroke falls to the same as a nonsmoker.  After 10 years, the risk of lung cancer is cut in half and the risk of other cancers decreases significantly.  After 15 years, the risk of coronary heart disease drops, usually to the level of a nonsmoker.  If you are pregnant, quitting smoking will improve your chances of having a healthy baby.  The people you live with, especially any children, will be healthier.  You will have extra money to spend on things other than cigarettes. QUESTIONS TO THINK ABOUT BEFORE ATTEMPTING TO QUIT You may want to talk about your answers with your caregiver.  Why do you want to quit?  If you tried to quit in the past, what helped and what did not?  What will be the most difficult situations for you after you quit? How will you plan to handle them?  Who can help you through the tough times? Your family? Friends? A caregiver?  What pleasures do you get from smoking? What ways can you still get pleasure if you quit? Here are some questions to ask your caregiver:  How can you help me to be successful at quitting?  What medicine do you think would be best for me and how should I take it?  What should I do if I need more help?  What is smoking withdrawal like? How can I get information on withdrawal? GET READY  Set a quit date.  Change your environment by getting rid of all cigarettes, ashtrays, matches, and lighters in your home, car, or work. Do not let people  smoke in your home.  Review your past attempts to quit. Think about what worked and what did not. GET SUPPORT AND ENCOURAGEMENT You have a better chance of being successful if you have help. You can get support in many ways.  Tell your family, friends, and co-workers that you are going to quit and need their support. Ask them not to smoke around you.  Get individual, group, or telephone counseling and support. Programs are available at [employer] and health centers. Call your local health department for information about programs in your area.  Spiritual beliefs and practices may help some smokers quit.  Download a "quit meter" on your computer to keep track of quit statistics, such as how long you have gone without smoking, cigarettes not smoked, and money saved.  Get a self-help book about quitting smoking and staying off of tobacco. LEARN NEW SKILLS AND BEHAVIORS  Distract yourself from urges to smoke. Talk to someone, go for a walk, or occupy  your time with a task.  Change your normal routine. Take a different route to work. Drink tea instead of coffee. Eat breakfast in a different place.  Reduce your stress. Take a hot bath, exercise, or read a book.  Plan something enjoyable to do every day. Reward yourself for not smoking.  Explore interactive web-based programs that specialize in helping you quit. GET MEDICINE AND USE IT CORRECTLY Medicines can help you stop smoking and decrease the urge to smoke. Combining medicine with the above behavioral methods and support can greatly increase your chances of successfully quitting smoking.  Nicotine replacement therapy helps deliver nicotine to your body without the negative effects and risks of smoking. Nicotine replacement therapy includes nicotine gum, lozenges, inhalers, nasal sprays, and skin patches. Some may be available over-the-counter and others require a prescription.  Antidepressant medicine helps people abstain from  smoking, but how this works is unknown. This medicine is available by prescription.  Nicotinic receptor partial agonist medicine simulates the effect of nicotine in your brain. This medicine is available by prescription. Ask your caregiver for advice about which medicines to use and how to use them based on your health history. Your caregiver will tell you what side effects to look out for if you choose to be on a medicine or therapy. Carefully read the information on the package. Do not use any other product containing nicotine while using a nicotine replacement product.  RELAPSE OR DIFFICULT SITUATIONS Most relapses occur within the first 3 months after quitting. Do not be discouraged if you start smoking again. Remember, most people try several times before finally quitting. You may have symptoms of withdrawal because your body is used to nicotine. You may crave cigarettes, be irritable, feel very hungry, cough often, get headaches, or have difficulty concentrating. The withdrawal symptoms are only temporary. They are strongest when you first quit, but they will go away within 10 14 days. To reduce the chances of relapse, try to:  Avoid drinking alcohol. Drinking lowers your chances of successfully quitting.  Reduce the amount of caffeine you consume. Once you quit smoking, the amount of caffeine in your body increases and can give you symptoms, such as a rapid heartbeat, sweating, and anxiety.  Avoid smokers because they can make you want to smoke.  Do not let weight gain distract you. Many smokers will gain weight when they quit, usually less than 10 pounds. Eat a healthy diet and stay active. You can always lose the weight gained after you quit.  Find ways to improve your mood other than smoking. FOR MORE INFORMATION  www.smokefree.gov  Document Released: 07/11/2001 Document Revised: 01/16/2012 Document Reviewed: 10/26/2011 [facility] Patient Information 2014 [place], [place].

## 2012-12-20 NOTE — Assessment & Plan Note (Signed)
Pt given coupon for [employer] Pt understands importance of quitting

## 2012-12-20 NOTE — Progress Notes (Signed)
  Subjective:    Patient here for follow-up of elevated blood pressure.  She is not exercising and is adherent to a low-salt diet.  Blood pressure is well controlled at home. Cardiac symptoms: none. Patient denies: chest pain, chest pressure/discomfort, claudication, dyspnea, exertional chest pressure/discomfort, fatigue, irregular heart beat, lower extremity edema, near-syncope, orthopnea, palpitations, paroxysmal nocturnal dyspnea, syncope and tachypnea. Cardiovascular risk factors: dyslipidemia, hypertension and smoking/ tobacco exposure. Use of agents associated with hypertension: none. History of target organ damage: none.  Pt also here for labs for lipids.  She is complaining of leg cramps with crestor.    She also c/o URI symptoms with runny nose, sneezing,  No fever  The following portions of the patient's history were reviewed and updated as appropriate: allergies, current medications, past family history, past medical history, past social history, past surgical history and problem list.  Review of Systems Pertinent items are noted in HPI.     Objective:    BP 120/72  Pulse 70  Temp(Src) 98.7 F (37.1 C) (Oral)  Wt 120 lb 3.2 oz (54.522 kg)  BMI 22.72 kg/m2  SpO2 97% General appearance: alert, cooperative, appears stated age and no distress HEENT--- TMI b/l ,  Nose turb red , swollen Throat: lips, mucosa, and tongue normal; teeth and gums normal Neck: no adenopathy, no carotid bruit, no JVD, supple, symmetrical, trachea midline and thyroid not enlarged, symmetric, no tenderness/mass/nodules Lungs: clear to auscultation bilaterally Heart: S1, S2 normal Extremities: extremities normal, atraumatic, no cyanosis or edema    Assessment:    Hypertension, normal blood pressure . Evidence of target organ damage: none.    Plan:    Medication: no change. Dietary sodium restriction. Regular aerobic exercise. Check blood pressures 2-3 times weekly and record. Follow up: 6 months  and as needed.

## 2012-12-20 NOTE — Assessment & Plan Note (Signed)
Check labs Pt asking to switch meds secondary to muscle aches D/c crestor Start zocor Take Co Q10 200 mg  Daily

## 2012-12-30 ENCOUNTER — Telehealth: Payer: Self-pay | Admitting: Family Medicine

## 2012-12-30 DIAGNOSIS — E785 Hyperlipidemia, unspecified: Secondary | ICD-10-CM

## 2012-12-30 MED ORDER — SIMVASTATIN 40 MG PO TABS: 40.0000 mg | ORAL_TABLET | Freq: Every day | ORAL | Status: DC

## 2012-12-30 NOTE — Telephone Encounter (Signed)
Discussed with patient and she voiced understanding. Rx sent and she will call if she has a issues with the insurance company.  KP

## 2012-12-30 NOTE — Telephone Encounter (Signed)
Inc zocor 40 mg #30 1 po qhs , 2 refills----- recheck 3 months Lipid, hep

## 2012-12-30 NOTE — Telephone Encounter (Signed)
Patient is calling to discuss her lab results from 12/20/12. States that she did not receive them in the mail.

## 2013-01-10 ENCOUNTER — Ambulatory Visit (HOSPITAL_BASED_OUTPATIENT_CLINIC_OR_DEPARTMENT_OTHER)
Admission: RE | Admit: 2013-01-10 | Discharge: 2013-01-10 | Disposition: A | Discharge status: Home / Self Care | Payer: BC Managed Care – PPO | Source: Ambulatory Visit | Attending: Family Medicine | Admitting: Family Medicine

## 2013-01-10 ENCOUNTER — Ambulatory Visit (INDEPENDENT_AMBULATORY_CARE_PROVIDER_SITE_OTHER): Payer: BC Managed Care – PPO | Admitting: Family Medicine

## 2013-01-10 ENCOUNTER — Encounter: Payer: Self-pay | Admitting: Family Medicine

## 2013-01-10 VITALS — BP 122/72 | HR 74 | Temp 98.5°F | Wt 121.0 lb

## 2013-01-10 DIAGNOSIS — J209 Acute bronchitis, unspecified: Secondary | ICD-10-CM

## 2013-01-10 DIAGNOSIS — R059 Cough, unspecified: Secondary | ICD-10-CM | POA: Insufficient documentation

## 2013-01-10 DIAGNOSIS — R05 Cough: Secondary | ICD-10-CM | POA: Insufficient documentation

## 2013-01-10 DIAGNOSIS — R062 Wheezing: Secondary | ICD-10-CM

## 2013-01-10 MED ORDER — ALBUTEROL SULFATE HFA 108 (90 BASE) MCG/ACT IN AERS: 2.0000 | INHALATION_SPRAY | Freq: Four times a day (QID) | RESPIRATORY_TRACT | Status: DC | PRN

## 2013-01-10 MED ORDER — BECLOMETHASONE DIPROPIONATE 40 MCG/ACT IN AERS: 2.0000 | INHALATION_SPRAY | Freq: Two times a day (BID) | RESPIRATORY_TRACT | Status: DC

## 2013-01-10 MED ORDER — ALBUTEROL SULFATE (2.5 MG/3ML) 0.083% IN NEBU
2.5000 mg | INHALATION_SOLUTION | Freq: Once | RESPIRATORY_TRACT | Status: AC
  Administered 2013-01-10: 2.5 mg via RESPIRATORY_TRACT

## 2013-01-10 MED ORDER — CLARITHROMYCIN ER 500 MG PO TB24: 1000.0000 mg | ORAL_TABLET | Freq: Every day | ORAL | Status: AC

## 2013-01-10 MED ORDER — PREDNISONE 10 MG PO TABS: ORAL_TABLET | ORAL | Status: DC

## 2013-01-10 MED ORDER — METHYLPREDNISOLONE ACETATE 80 MG/ML IJ SUSP
80.0000 mg | Freq: Once | INTRAMUSCULAR | Status: AC
  Administered 2013-01-10: 80 mg via INTRAMUSCULAR

## 2013-01-10 NOTE — Progress Notes (Signed)
  Subjective:     Leah [name] is a 67 y.o. female here for evaluation of a cough. Onset of symptoms was 1 week ago. Symptoms have been gradually worsening since that time. The cough is productive and is aggravated by infection and reclining position. Associated symptoms include: postnasal drip, shortness of breath, sputum production and wheezing. Patient does not have a history of asthma. Patient does have a history of environmental allergens. Patient has not traveled recently. Patient does have a history of smoking. Patient has not had a previous chest x-ray. Patient has not had a PPD done.  The following portions of the patient's history were reviewed and updated as appropriate: allergies, current medications, past family history, past medical history, past social history, past surgical history and problem list.  Review of Systems Pertinent items are noted in HPI.    Objective:    Oxygen saturation 94% on room air BP 122/72  Pulse 74  Temp(Src) 98.5 F (36.9 C) (Oral)  Wt 121 lb (54.885 kg)  BMI 22.87 kg/m2  SpO2 94% General appearance: alert, cooperative, appears stated age and no distress Ears: normal TM's and external ear canals both ears Nose: Nares normal. Septum midline. Mucosa normal. No drainage or sinus tenderness. Throat: lips, mucosa, and tongue normal; teeth and gums normal Neck: no adenopathy, no JVD, supple, symmetrical, trachea midline and thyroid not enlarged, symmetric, no tenderness/mass/nodules Lungs: rhonchi bilaterally and wheezes bilaterally Heart: S1, S2 normal Extremities: extremities normal, atraumatic, no cyanosis or edema    Assessment:    Acute Bronchitis    Plan:    Antibiotics per medication orders. Avoid exposure to tobacco smoke and fumes. B-agonist inhaler. Call if shortness of breath worsens, blood in sputum, change in character of cough, development of fever or chills, inability to maintain nutrition and hydration. Avoid exposure to  tobacco smoke and fumes. Chest x-ray. Steroid inhaler as ordered. rto 2 weeks

## 2013-01-10 NOTE — Patient Instructions (Addendum)
Bronchitis Bronchitis is the body's way of reacting to injury and/or infection (inflammation) of the bronchi. Bronchi are the air tubes that extend from the windpipe into the lungs. If the inflammation becomes severe, it may cause shortness of breath. CAUSES  Inflammation may be caused by:  A virus.  Germs (bacteria).  Dust.  Allergens.  Pollutants and many other irritants. The cells lining the bronchial tree are covered with tiny hairs (cilia). These constantly beat upward, away from the lungs, toward the mouth. This keeps the lungs free of pollutants. When these cells become too irritated and are unable to do their job, mucus begins to develop. This causes the characteristic cough of bronchitis. The cough clears the lungs when the cilia are unable to do their job. Without either of these protective mechanisms, the mucus would settle in the lungs. Then you would develop pneumonia. Smoking is a common cause of bronchitis and can contribute to pneumonia. Stopping this habit is the single most important thing you can do to help yourself. TREATMENT   Your caregiver may prescribe an antibiotic if the cough is caused by bacteria. Also, medicines that open up your airways make it easier to breathe. Your caregiver may also recommend or prescribe an expectorant. It will loosen the mucus to be coughed up. Only take over-the-counter or prescription medicines for pain, discomfort, or fever as directed by your caregiver.  Removing whatever causes the problem (smoking, for example) is critical to preventing the problem from getting worse.  Cough suppressants may be prescribed for relief of cough symptoms.  Inhaled medicines may be prescribed to help with symptoms now and to help prevent problems from returning.  For those with recurrent (chronic) bronchitis, there may be a need for steroid medicines. SEEK IMMEDIATE MEDICAL CARE IF:   During treatment, you develop more pus-like mucus (purulent  sputum).  You have a fever.  Your baby is older than 3 months with a rectal temperature of 102 F (38.9 C) or higher.  Your baby is 24 months old or younger with a rectal temperature of 100.4 F (38 C) or higher.  You become progressively more ill.  You have increased difficulty breathing, wheezing, or shortness of breath. It is necessary to seek immediate medical care if you are elderly or sick from any other disease. MAKE SURE YOU:   Understand these instructions.  Will watch your condition.  Will get help right away if you are not doing well or get worse. Document Released: 07/17/2005 Document Revised: 10/09/2011 Document Reviewed: 05/26/2008 [facility] Patient Information 2014 [place], [place].   STOP CHOLESTEROL MEDICATION WHILE ON BIAXIN!

## 2013-01-10 NOTE — Addendum Note (Signed)
Addended by: Arnette Norris on: 01/10/2013 01:34 PM   Modules accepted: Orders

## 2013-01-15 ENCOUNTER — Encounter: Payer: Self-pay | Admitting: Family Medicine

## 2013-01-15 ENCOUNTER — Ambulatory Visit (INDEPENDENT_AMBULATORY_CARE_PROVIDER_SITE_OTHER): Payer: BC Managed Care – PPO | Admitting: Family Medicine

## 2013-01-15 VITALS — BP 118/72 | HR 71 | Temp 98.8°F | Wt 119.6 lb

## 2013-01-15 DIAGNOSIS — J441 Chronic obstructive pulmonary disease with (acute) exacerbation: Secondary | ICD-10-CM

## 2013-01-15 DIAGNOSIS — J449 Chronic obstructive pulmonary disease, unspecified: Secondary | ICD-10-CM | POA: Insufficient documentation

## 2013-01-15 DIAGNOSIS — J209 Acute bronchitis, unspecified: Secondary | ICD-10-CM

## 2013-01-15 DIAGNOSIS — J019 Acute sinusitis, unspecified: Secondary | ICD-10-CM | POA: Insufficient documentation

## 2013-01-15 MED ORDER — IPRATROPIUM-ALBUTEROL 0.5-2.5 (3) MG/3ML IN SOLN
3.0000 mL | Freq: Once | RESPIRATORY_TRACT | Status: AC
  Administered 2013-01-15: 3 mL via RESPIRATORY_TRACT

## 2013-01-15 MED ORDER — DOXYCYCLINE HYCLATE 100 MG PO TABS: 100.0000 mg | ORAL_TABLET | Freq: Two times a day (BID) | ORAL | Status: DC

## 2013-01-15 MED ORDER — TIOTROPIUM BROMIDE MONOHYDRATE 18 MCG IN CAPS: 18.0000 ug | ORAL_CAPSULE | Freq: Every day | RESPIRATORY_TRACT | Status: DC

## 2013-01-15 MED ORDER — MOMETASONE FURO-FORMOTEROL FUM 100-5 MCG/ACT IN AERO: 2.0000 | INHALATION_SPRAY | Freq: Two times a day (BID) | RESPIRATORY_TRACT | Status: DC

## 2013-01-15 NOTE — Assessment & Plan Note (Signed)
nasonex Doxy rto prn

## 2013-01-15 NOTE — Patient Instructions (Addendum)

## 2013-01-15 NOTE — Assessment & Plan Note (Signed)
Change inhalers---dulera 100, con't proair Pt states she is unable to take pred nisone --- allergy spiriva  Refer pulmonary

## 2013-01-15 NOTE — Progress Notes (Signed)
  Subjective:    Patient ID: Leah [name], female    DOB: June 18, 1946, 67 y.o.   MRN: [id number]  HPI Pt here f/u bronchitis.  Pt is only a little bit better so she is here for re evaluation.  See last ov.   Review of Systems As above     Objective:   Physical Exam  BP 118/72  Pulse 71  Temp(Src) 98.8 F (37.1 C) (Oral)  Wt 119 lb 9.6 oz (54.25 kg)  BMI 22.61 kg/m2  SpO2 95% General appearance: alert, cooperative, appears stated age and no distress Ears: normal TM's and external ear canals both ears Nose: Nares normal. Septum midline. Mucosa normal. No drainage or sinus tenderness., green discharge, mild congestion, turbinates red, swollen, sinus tenderness bilateral Throat: lips, mucosa, and tongue normal; teeth and gums normal Neck: no adenopathy, supple, symmetrical, trachea midline and thyroid not enlarged, symmetric, no tenderness/mass/nodules Lungs: rhonchi bilaterally and wheezes bilaterally---only slight improvement with neb Heart: S1, S2 normal       Assessment & Plan:

## 2013-01-27 ENCOUNTER — Telehealth: Payer: Self-pay | Admitting: *Deleted

## 2013-01-27 ENCOUNTER — Telehealth: Payer: Self-pay | Admitting: Family Medicine

## 2013-01-27 NOTE — Telephone Encounter (Signed)
Attempted to contact pt to make her aware that Dr. Laury Axon states the pulmonologist needs to be the one to complete her FMLA paperwork when she goes to her appt later this week. Left message with spouse to return our call regarding FMLA papers. (They have been placed up front)     From: Baldwin Jamaica, RN Sent: 01/27/2013 9:32 AM To: Lelon Perla, DO Subject: FMLA Forms   This pt is requesting FMLA forms be completed for acute bronchitis flare ups. She has been seen here twice (6/13 & 6/18) and a pulmonary referral was placed. Do you want to complete these or defer to pulmonary?    Response from Dr. Laury Axon: "If she is seeing pulmonary for problem they should do it"

## 2013-01-27 NOTE — Telephone Encounter (Signed)
Patient is returning a call about her FMLA paperwork. See previous phone note. States that she has not seen her Pulmonary doctor yet and she was seeing our office for this issue during the time she was out. Patient states that she was out from work on 01/06/13 and went back to work 01/20/13. Patient says we need to be the ones filling out her FMLA.

## 2013-01-27 NOTE — Telephone Encounter (Signed)
ok 

## 2013-01-27 NOTE — Telephone Encounter (Signed)
To MD for review. Please advise        KP

## 2013-01-30 ENCOUNTER — Institutional Professional Consult (permissible substitution): Payer: BC Managed Care – PPO | Admitting: Internal Medicine

## 2013-02-03 ENCOUNTER — Telehealth: Payer: Self-pay | Admitting: *Deleted

## 2013-02-03 NOTE — Telephone Encounter (Signed)
Patient called the office inquiring on status of FMLA paperwork. I advised that we could fill out for recent OV but that pulmonary must complete all subsequent forms from here on out. Pt verbalized understanding.

## 2013-02-04 ENCOUNTER — Ambulatory Visit (INDEPENDENT_AMBULATORY_CARE_PROVIDER_SITE_OTHER): Payer: BC Managed Care – PPO | Admitting: Internal Medicine

## 2013-02-04 ENCOUNTER — Encounter: Payer: Self-pay | Admitting: Internal Medicine

## 2013-02-04 VITALS — BP 102/60 | HR 91 | Temp 98.5°F | Ht 60.75 in | Wt 122.8 lb

## 2013-02-04 DIAGNOSIS — I10 Essential (primary) hypertension: Secondary | ICD-10-CM

## 2013-02-04 DIAGNOSIS — F17200 Nicotine dependence, unspecified, uncomplicated: Secondary | ICD-10-CM

## 2013-02-04 DIAGNOSIS — J449 Chronic obstructive pulmonary disease, unspecified: Secondary | ICD-10-CM

## 2013-02-04 MED ORDER — TELMISARTAN 40 MG PO TABS: 40.0000 mg | ORAL_TABLET | Freq: Every day | ORAL | Status: DC

## 2013-02-04 NOTE — Patient Instructions (Addendum)
Stop lisinopril  Start micardis 40 mg one half daily   Stop spiriva and only use albuterol as needed for short of breath  Stop smoking if at all possible   Please schedule a follow up office visit in 6 weeks, call sooner if needed for pfts

## 2013-02-04 NOTE — Progress Notes (Signed)
  Subjective:    Patient ID: Leah [name], female    DOB: 02-19-1946   MRN: [id number]  HPI  23 yowf active smoker and works at [employer] referred by Dr Laury Axon 02/04/2013 to pulmonary clinic for ? Copd.  02/04/2013 1st pulmonary eval on ACEi  cc recurrent spells of sob assoc with cough/congestion about 3x a year starting with nasal symptoms and spreading  Into chest last a couple weeks and resp to abx and inhalers with last spell ending a couple of weeks prior to OV >  missed 10 days of work  At initial ov back to baseline and no inhalers doing great x for sense of sinus drainage with mostly dry cough worse lie down and early in am but  Mild dysphagia and urge to clear throat and no excess or purulent sputum. Not limited by breathing or sense she needs to use inhalers at this point though listed as taking spiriva and albuterol not really using either as intended   No obvious daytime variabilty or assoc  cp or chest tightness, subjective wheeze overt sinus or hb symptoms. No unusual exp hx or h/o childhood pna/ asthma or knowledge of premature birth.   Sleeping ok without nocturnal  or early am exacerbation  of respiratory  c/o's or need for noct saba. Also denies any obvious fluctuation of symptoms with weather or environmental changes or other aggravating or alleviating factors except as outlined above     Review of Systems  Constitutional: Negative for fever, chills and unexpected weight change.  HENT: Positive for sore throat, trouble swallowing and sinus pressure. Negative for ear pain, nosebleeds, congestion, rhinorrhea, sneezing, dental problem, voice change and postnasal drip.   Eyes: Negative for visual disturbance.  Respiratory: Positive for cough. Negative for choking and shortness of breath.   Cardiovascular: Negative for chest pain and leg swelling.  Gastrointestinal: Negative for vomiting, abdominal pain and diarrhea.  Genitourinary: Negative for difficulty urinating.   Musculoskeletal: Negative for arthralgias.  Skin: Negative for rash.  Neurological: Negative for tremors, syncope and headaches.  Hematological: Does not bruise/bleed easily.       Objective:   Physical Exam   Wt Readings from Last 3 Encounters:  02/04/13 122 lb 12.8 oz (55.702 kg)  01/15/13 119 lb 9.6 oz (54.25 kg)  01/10/13 121 lb (54.885 kg)     HEENT mild turbinate edema.  Oropharynx no thrush or excess pnd or cobblestoning.  No JVD or cervical adenopathy. Mild accessory muscle hypertrophy. Trachea midline, nl thryroid. Chest was hyperinflated by percussion with diminished breath sounds and moderate increased exp time without wheeze. Hoover sign positive at mid inspiration. Regular rate and rhythm without murmur gallop or rub or increase P2 or edema.  Abd: no hsm, nl excursion. Ext warm without cyanosis or clubbing.    01/10/13 cxr reviewed No acute abnormalities.  Question COPD.     Assessment & Plan:

## 2013-02-05 ENCOUNTER — Telehealth: Payer: Self-pay | Admitting: *Deleted

## 2013-02-05 DIAGNOSIS — Z0279 Encounter for issue of other medical certificate: Secondary | ICD-10-CM

## 2013-02-05 NOTE — Telephone Encounter (Signed)
FMLA paperwork completed and pt notified it is ready for pickup. Pt also informed that any further forms for COPD related issues needs to be completed by pulmonary.

## 2013-02-07 DIAGNOSIS — I10 Essential (primary) hypertension: Secondary | ICD-10-CM | POA: Insufficient documentation

## 2013-02-07 NOTE — Assessment & Plan Note (Addendum)
DDX of  difficult airways managment all start with A and  include Adherence, Ace Inhibitors, Acid Reflux, Active Sinus Disease, Alpha 1 Antitripsin deficiency, Anxiety masquerading as Airways dz,  ABPA,  allergy(esp in young), Aspiration (esp in elderly), Adverse effects of DPI,  Active smokers, plus two Bs  = Bronchiectasis and Beta blocker use..and one C= CHF   Adherence is always the initial "prime suspect" and is a multilayered concern that requires a "trust but verify" approach in every patient - starting with knowing how to use medications, especially inhalers, correctly, keeping up with refills and understanding the fundamental difference between maintenance and prns vs those medications only taken for a very short course and then stopped and not refilled.  She is really not complying with regimen so will keep things simple for now and just ask her to use/ monitor need for saba prn  Acei problematic here given all the upper airway features > try off (see hbp)  Adverse effect of DPI >  Of concern re upper airway symptoms ( the same reason as acei issue) > d/c spiriva (not using it correctly anyway)   Active smoking > discussed separately   Needs pfts to complete w/u > scheduled

## 2013-02-07 NOTE — Assessment & Plan Note (Addendum)
ACE inhibitors are problematic in  pts with airway complaints because  even experienced pulmonologists can't always distinguish ace effects from copd/asthma.  By themselves they don't actually cause a problem, much like oxygen can't by itself start a fire, but they certainly serve as a powerful catalyst or enhancer for any "fire"  or inflammatory process in the upper airway, be it caused by an ET  tube or more commonly reflux (especially in the obese or pts with known GERD or who are on biphoshonates).    In the era of ARB near equivalency until we have a better handle on the reversibility of the airway problem, it just makes sense to avoid ACEI  entirely in the short run and then decide later, having established a level of airway control using a reasonable limited regimen, whether to add back ace but even then being very careful to observe the pt for worsening airway control and number of meds used/ needed to control symptoms.    Will start micardis 40 one half daily on a trial basis

## 2013-02-07 NOTE — Assessment & Plan Note (Signed)
I reviewed the Flethcher curve with patient that basically indicates  if you quit smoking when your best day FEV1 is still well preserved it is highly unlikely you will progress to severe disease and informed the patient there was no medication on the market that has proven to change the curve or the likelihood of progression.  Therefore stopping smoking and maintaining abstinence is the most important aspect of care, not choice of inhalers or for that matter, doctors.

## 2013-03-03 ENCOUNTER — Telehealth: Payer: Self-pay | Admitting: Internal Medicine

## 2013-03-04 NOTE — Telephone Encounter (Signed)
This pt has seen Willette Cluster, NP in the past. She has a hx of Sigmoid Resection for diverticulitis with recurrent diverticulitis since. Her last Colon was 07/12/2010 and an ulcer was found at the anastomosis site. Pt states she feels the same way now. She reports being more constipated now, and a change in the shape of her stools; last normal BM a week ago.. Pt will see Willette Cluster, NP in am

## 2013-03-04 NOTE — Telephone Encounter (Signed)
lmom for pt to call back

## 2013-03-05 ENCOUNTER — Encounter: Payer: Self-pay | Admitting: Nurse Practitioner

## 2013-03-05 ENCOUNTER — Ambulatory Visit (INDEPENDENT_AMBULATORY_CARE_PROVIDER_SITE_OTHER): Payer: BC Managed Care – PPO | Admitting: Nurse Practitioner

## 2013-03-05 VITALS — BP 142/66 | HR 69 | Ht 61.5 in | Wt 122.0 lb

## 2013-03-05 DIAGNOSIS — J019 Acute sinusitis, unspecified: Secondary | ICD-10-CM

## 2013-03-05 DIAGNOSIS — K5730 Diverticulosis of large intestine without perforation or abscess without bleeding: Secondary | ICD-10-CM

## 2013-03-05 DIAGNOSIS — R1032 Left lower quadrant pain: Secondary | ICD-10-CM

## 2013-03-05 DIAGNOSIS — K5900 Constipation, unspecified: Secondary | ICD-10-CM

## 2013-03-05 DIAGNOSIS — J209 Acute bronchitis, unspecified: Secondary | ICD-10-CM

## 2013-03-05 MED ORDER — METRONIDAZOLE 250 MG PO TABS: 250.0000 mg | ORAL_TABLET | Freq: Four times a day (QID) | ORAL | Status: DC

## 2013-03-05 MED ORDER — DICYCLOMINE HCL 10 MG PO CAPS: 10.0000 mg | ORAL_CAPSULE | Freq: Three times a day (TID) | ORAL | Status: DC

## 2013-03-05 MED ORDER — DOXYCYCLINE HYCLATE 100 MG PO TABS: 100.0000 mg | ORAL_TABLET | Freq: Two times a day (BID) | ORAL | Status: DC

## 2013-03-05 NOTE — Progress Notes (Signed)
  History of Present Illness:  Patient is a 67 year old female known to Dr. [name] for a history of Barrett's esophagus (October 2011) and diverticular disease. Her last colonoscopy was August 2008 with findings of a tortuous left colon with diverticulosis and luminal narrowing. Patient is status post sigmoid colectomy March 2010 with recurrent sigmoid diverticulitis on CT scan December 2011. Following treatment with antibiotics patient underwent a flexible sigmoidoscopy Dec 2011 with findings of an anastomotic ulcer and a widely patent anastomosis. Since that time she has been treated twice for presumed recurrent diverticulitis, once in June 2012 and again in August 2013.   Patient is back with left lower quadrant pain. She describes chronic intermittent LLQ pain, worse over last several days. No fevers. No urinary symptoms. She has chronic, intermittent constipation which is also been worse over the last several days.. No blood in stools.   Current Medications, Allergies, Past Medical History, Past Surgical History, Family History and Social History were reviewed in [employer] record.  Physical Exam: General: Well developed , white female in no acute distress Head: Normocephalic and atraumatic Eyes:  sclerae anicteric, conjunctiva pink  Ears: Normal auditory acuity Lungs: Clear throughout to auscultation Heart: Regular rate and rhythm Abdomen: Soft, non distended, moderate LLQ tenderness. . No masses, no hepatomegaly. Normal bowel sounds Musculoskeletal: Symmetrical with no gross deformities  Extremities: No edema  Neurological: Alert oriented x 4, grossly nonfocal Psychological:  Alert and cooperative. Normal mood and affect  Assessment and Recommendations: 71. 66 year old female with ahistory of diverticulitis, s/p sigmoid resection in 2010. She had recurrent sigmoid diverticulitis documented by CTscan in December 2011. Following that episode she was treated  empirically for recurrences in 2012 and 2013. Flexible sigmoidoscopy in 2012 revealed a superficial anastomotic ulcer.  Patient now presenting with recurrent LLQ pain accompanied by acute on chronic constipation. I do not know for certain whether she has recurrent diverticulitis but removing constipation from the equation will be helpful. Will purge bowels and start her on bowel regimen of MiraLax 1 to 2 times daily. She was given Flagyl,  Doxycycline and Bentyl to take if pain persist after resolution of constipation. Patient will return for a recheck in approximately 3 weeks. She knows to call in the interim for fever or worsening pain.  If patient continues to have episodes of left lower quadrant pain she will need further workup / possibly surgical referral.   2. Barrett's esophagus. She is up to date on surveillance exam. On PPI at home.

## 2013-03-05 NOTE — Patient Instructions (Addendum)
You have been given a separate informational sheet regarding your tobacco use, the importance of quitting and local resources to help you quit.  Use Miralax Twice a day until constipation is resolved then decrease to once a day We are sending in your prescriptions to your pharmacy Follow up with Dr [name] in 3-4 weeks

## 2013-03-05 NOTE — Progress Notes (Signed)
Reviewed and agree. Consider BEnema vs repeat CT scan with rectal contrast to look for chronic fistula.

## 2013-03-26 ENCOUNTER — Ambulatory Visit: Payer: BC Managed Care – PPO | Admitting: Internal Medicine

## 2013-04-01 ENCOUNTER — Encounter: Payer: Self-pay | Admitting: Internal Medicine

## 2013-04-01 ENCOUNTER — Ambulatory Visit (INDEPENDENT_AMBULATORY_CARE_PROVIDER_SITE_OTHER): Payer: BC Managed Care – PPO | Admitting: Internal Medicine

## 2013-04-01 VITALS — BP 120/70 | HR 80 | Ht 61.5 in | Wt 124.5 lb

## 2013-04-01 DIAGNOSIS — R1032 Left lower quadrant pain: Secondary | ICD-10-CM

## 2013-04-01 DIAGNOSIS — K5732 Diverticulitis of large intestine without perforation or abscess without bleeding: Secondary | ICD-10-CM

## 2013-04-01 MED ORDER — MOVIPREP 100 G PO SOLR: 1.0000 | Freq: Once | ORAL | Status: DC

## 2013-04-01 NOTE — Patient Instructions (Addendum)

## 2013-04-01 NOTE — Progress Notes (Signed)
Leah [name] 1946-07-09 MRN [id number]        History of Present Illness:  This is a 67 year old, white female with chronic left lower quadrant abdominal pain last appointment 03/05/2013 with Gunnar Fusi. She underwent sigmoid colectomy for sigmoid diverticulitis in March 2010 had and had recurrent diverticulitis on CT scan in December 2011. Flexible sigmoidoscopy at that time showed small anastomotic ulcer. She had 2 subsequent attacks of "diverticulitis and responded to Flagyl and doxycycline. She has almost constant left lower quadrant discomfort. It bothers her during the day but usually not at night. Her bowel movements are formed. She has mild constipation for which she takes MiraLax every other day it gives her diarrhea at times. Pain is sometimes worse shortly postprandially. There has been no fever or back pain. History of Barrett's esophagus currently under good control. Last upper endoscopy February 2014. There was no Barrett's noted   Past Medical History  Diagnosis Date  . Esophageal spasm   . GERD (gastroesophageal reflux disease)   . Chronic gastritis   . Diverticulosis   . B12 deficiency   . IBS (irritable bowel syndrome)   . Anxiety   . Depression   . Hiatal hernia   . Iron deficiency anemia   . Hyperlipidemia   . Diverticulitis   . Barrett esophagus   . Rosacea   . Interstitial cystitis    Past Surgical History  Procedure Laterality Date  . Tonsillectomy and adenoidectomy    . Tmj arthroplasty    . Vocal cord polypectomy    . Carpal tunnel release  1993    right  . Abdominal hysterectomy    . Cholecystectomy    . Partial colectomy    . Sinus surgery with instatrak      reports that she has been smoking Cigarettes.  She has a 22.5 pack-year smoking history. She has never used smokeless tobacco. She reports that she does not drink alcohol or use illicit drugs. family history includes Colon polyps in her mother; Diabetes in her father; Heart disease in her  father; Hypertension in her father; Leukemia in her father; Pancreatic cancer in her father and mother; Prostate cancer in her father. There is no history of Colon cancer. Allergies  Allergen Reactions  . Amoxicillin-Pot Clavulanate   . Ciprofloxacin   . Moxifloxacin     REACTION: tongue swell hard to breath  . Penicillins   . Prednisone   . Sulfonamide Derivatives         Review of Systems: Denies heartburn dysphagia. Positive for left lower quadrant discomfort. Negative for rectal bleeding  The remainder of the 10 point ROS is negative except as outlined in H&P   Physical Exam: General appearance  Well developed, in no distress. Eyes- non icteric. HEENT nontraumatic, normocephalic. Mouth no lesions, tongue papillated, no cheilosis. Neck supple without adenopathy, thyroid not enlarged, no carotid bruits, no JVD. Lungs Clear to auscultation bilaterally. Cor normal S1, normal S2, regular rhythm, no murmur,  quiet precordium. Abdomen: Mild tenderness along the left lower and left middle quadrant. No rebound or fullness Rectal: Not done Extremities no pedal edema. Skin no lesions. Neurological alert and oriented x 3. Psychological normal mood and affect.  Assessment and Plan:  67 year old white female with chronic left lower quadrant discomfort and history of anastomotic ulcer and sigmoid resection for recurrent diverticulitis. Although radiographically there have been several abnormal CT scan suggesting acute inflammation I feel that her chronic pain is likely related to poorly functioning left colon. Because  of history of anastomotic ulcer and partial response to antibiotics we will go ahead with full colonoscopy .She will continue MiraLax 9 g every day. We will hold off on antibiotics at this time since she just has completed Flagyl and doxycycline.   04/01/2013 [name]

## 2013-04-02 ENCOUNTER — Encounter: Payer: Self-pay | Admitting: Internal Medicine

## 2013-04-09 ENCOUNTER — Encounter: Payer: Self-pay | Admitting: Internal Medicine

## 2013-04-23 ENCOUNTER — Ambulatory Visit: Payer: BC Managed Care – PPO | Admitting: Internal Medicine

## 2013-04-30 ENCOUNTER — Encounter: Payer: Self-pay | Admitting: Internal Medicine

## 2013-04-30 ENCOUNTER — Ambulatory Visit (AMBULATORY_SURGERY_CENTER): Payer: BC Managed Care – PPO | Admitting: Internal Medicine

## 2013-04-30 VITALS — BP 128/78 | HR 73 | Temp 97.6°F | Resp 16 | Ht 61.5 in | Wt 124.0 lb

## 2013-04-30 DIAGNOSIS — D126 Benign neoplasm of colon, unspecified: Secondary | ICD-10-CM

## 2013-04-30 DIAGNOSIS — R1032 Left lower quadrant pain: Secondary | ICD-10-CM

## 2013-04-30 MED ORDER — TRAMADOL HCL 50 MG PO TABS: 50.0000 mg | ORAL_TABLET | Freq: Four times a day (QID) | ORAL | Status: DC | PRN

## 2013-04-30 MED ORDER — SODIUM CHLORIDE 0.9 % IV SOLN: 500.0000 mL | INTRAVENOUS | Status: DC

## 2013-04-30 NOTE — Op Note (Signed)
[facility] [address] [place], [address]   COLONOSCOPY PROCEDURE REPORT  PATIENT: Leah [name], Leah [name]  MR#: [id number] BIRTHDATE: 10-Jan-1946 , 66  yrs. old GENDER: Female ENDOSCOPIST: Hart Carwin, MD REFERRED XB:JYNWGN Lowne, DO PROCEDURE DATE:  04/30/2013 PROCEDURE:   Colonoscopy with cold biopsy polypectomy First Screening Colonoscopy - Avg.  risk and is 50 yrs.  old or older - No.  Prior Negative Screening - Now for repeat screening. N/A  History of Adenoma - Now for follow-up colonoscopy & has been > or = to 3 yrs.  N/A  Polyps Removed Today? Yes. ASA CLASS:   Class II INDICATIONS:abdominal pain in the lower left quadrant and s/p sigmoid resection for diverticular disease in 2010,, recurrent LLQ abd.  pain CT scan in 2011 showed " diverticulitis).. MEDICATIONS: propofol (Diprivan) 200mg  IV  DESCRIPTION OF PROCEDURE:   After the risks benefits and alternatives of the procedure were thoroughly explained, informed consent was obtained.  A digital rectal exam revealed no abnormalities of the rectum.   The LB [id number]-[id number]  endoscope was introduced through the anus and advanced to the cecum, which was identified by both the appendix and ileocecal valve. No adverse events experienced.   The quality of the prep was good, using MoviPrep  The instrument was then slowly withdrawn as the colon was fully examined.      COLON FINDINGS: A smooth sessile polyp ranging between 3-34mm in size was found in the descending colon.  A polypectomy was performed with cold forceps.  The resection was complete and the polyp tissue was completely retrieved.   Mild diverticulosis was noted throughout the entire examined colon.   There was evidence of a prior end-to-end colo-colonic surgical anastomosis in the sigmoid colon.   Small internal hemorrhoids were found.  Retroflexed views revealed no abnormalities. The time to cecum=4 minutes 31 seconds. Withdrawal time=8  minutes 35 seconds.  The scope was withdrawn and the procedure completed. COMPLICATIONS: There were no complications.  ENDOSCOPIC IMPRESSION: 1.   Sessile polyp ranging between 3-66mm in size was found in the descending colon; polypectomy was performed with cold forceps 2.   Mild diverticulosis was noted throughout the entire examined colon 3.   There was evidence of a prior colo-colonic surgical anastomosis in the sigmoid colon at 20cm, anastomosis is widely patent 4.   Small internal hemorrhoids  RECOMMENDATIONS: 1.  Await pathology results 2.  High fiber diet 3. Continue Miralax 17 gm prn constipation  eSigned:  Hart Carwin, MD 04/30/2013 5:23 PM   cc:   PATIENT NAME:  Leah [name], Leah [name] MR#: [id number]

## 2013-04-30 NOTE — Progress Notes (Signed)
Patient did not experience any of the following events: a burn prior to discharge; a fall within the facility; wrong site/side/patient/procedure/implant event; or a hospital transfer or hospital admission upon discharge from the facility. (G8907) Patient did not have preoperative order for IV antibiotic SSI prophylaxis. (G8918)  

## 2013-04-30 NOTE — Progress Notes (Signed)
Called to room to assist during endoscopic procedure.  Patient ID and intended procedure confirmed with present staff. Received instructions for my participation in the procedure from the performing physician.  

## 2013-04-30 NOTE — Patient Instructions (Addendum)
YOU HAD AN ENDOSCOPIC PROCEDURE TODAY AT THE [facility]: Refer to the procedure report that was given to you for any specific questions about what was found during the examination.  If the procedure report does not answer your questions, please call your gastroenterologist to clarify.  If you requested that your care partner not be given the details of your procedure findings, then the procedure report has been included in a sealed envelope for you to review at your convenience later.  YOU SHOULD EXPECT: Some feelings of bloating in the abdomen. Passage of more gas than usual.  Walking can help get rid of the air that was put into your GI tract during the procedure and reduce the bloating. If you had a lower endoscopy (such as a colonoscopy or flexible sigmoidoscopy) you may notice spotting of blood in your stool or on the toilet paper. If you underwent a bowel prep for your procedure, then you may not have a normal bowel movement for a few days.  DIET: Your first meal following the procedure should be a light meal and then it is ok to progress to your normal diet.  A half-sandwich or bowl of soup is an example of a good first meal.  Heavy or fried foods are harder to digest and may make you feel nauseous or bloated.  Likewise meals heavy in dairy and vegetables can cause extra gas to form and this can also increase the bloating.  Drink plenty of fluids but you should avoid alcoholic beverages for 24 hours.  ACTIVITY: Your care partner should take you home directly after the procedure.  You should plan to take it easy, moving slowly for the rest of the day.  You can resume normal activity the day after the procedure however you should NOT DRIVE or use heavy machinery for 24 hours (because of the sedation medicines used during the test).    SYMPTOMS TO REPORT IMMEDIATELY: A gastroenterologist can be reached at any hour.  During normal business hours, 8:30 AM to 5:00 PM Monday through Friday,  call [phone].  After hours and on weekends, please call the GI answering service at [phone] who will take a message and have the physician on call contact you.   Following lower endoscopy (colonoscopy or flexible sigmoidoscopy):  Excessive amounts of blood in the stool  Significant tenderness or worsening of abdominal pains  Swelling of the abdomen that is new, acute  Fever of 100F or higher    FOLLOW UP: If any biopsies were taken you will be contacted by phone or by letter within the next 1-3 weeks.  Call your gastroenterologist if you have not heard about the biopsies in 3 weeks.  Our staff will call the home number listed on your records the next business day following your procedure to check on you and address any questions or concerns that you may have at that time regarding the information given to you following your procedure. This is a courtesy call and so if there is no answer at the home number and we have not heard from you through the emergency physician on call, we will assume that you have returned to your regular daily activities without incident.  SIGNATURES/CONFIDENTIALITY: You and/or your care partner have signed paperwork which will be entered into your electronic medical record.  These signatures attest to the fact that that the information above on your After Visit Summary has been reviewed and is understood.  Full responsibility of the confidentiality   of this discharge information lies with you and/or your care-partner.     

## 2013-05-01 ENCOUNTER — Telehealth: Payer: Self-pay | Admitting: *Deleted

## 2013-05-01 ENCOUNTER — Telehealth: Payer: Self-pay | Admitting: Internal Medicine

## 2013-05-01 NOTE — Telephone Encounter (Signed)
No identifier, left message, follow-up  

## 2013-05-02 NOTE — Telephone Encounter (Signed)
230 pm called number left by pt and left message to return call so correct medicine is called in. ewm

## 2013-05-02 NOTE — Telephone Encounter (Signed)
[phone]  Called pt's number again left by pt to inform her Tramadol called in to pharmacy as per Dr Regino Schultze oreders yesterday. Please call back with further problems. ewm

## 2013-05-02 NOTE — Telephone Encounter (Signed)
[phone] called patients pharmacy [facility]

## 2013-05-06 ENCOUNTER — Encounter: Payer: Self-pay | Admitting: Internal Medicine

## 2013-05-15 ENCOUNTER — Ambulatory Visit: Payer: BC Managed Care – PPO | Admitting: Internal Medicine

## 2013-06-19 ENCOUNTER — Ambulatory Visit (INDEPENDENT_AMBULATORY_CARE_PROVIDER_SITE_OTHER): Payer: BC Managed Care – PPO | Admitting: Internal Medicine

## 2013-06-19 ENCOUNTER — Encounter: Payer: Self-pay | Admitting: Internal Medicine

## 2013-06-19 VITALS — BP 120/70 | HR 86 | Temp 98.4°F | Ht 61.0 in | Wt 122.0 lb

## 2013-06-19 DIAGNOSIS — J449 Chronic obstructive pulmonary disease, unspecified: Secondary | ICD-10-CM

## 2013-06-19 DIAGNOSIS — Z23 Encounter for immunization: Secondary | ICD-10-CM

## 2013-06-19 DIAGNOSIS — F17200 Nicotine dependence, unspecified, uncomplicated: Secondary | ICD-10-CM

## 2013-06-19 LAB — PULMONARY FUNCTION TEST

## 2013-06-19 NOTE — Patient Instructions (Addendum)
The key is to stop smoking completely before smoking completely stops you - you only have mild/ moderate copd so it is not too late!!!  Work on inhaler technique:  relax and gently blow all the way out then take a nice smooth deep breath back in, triggering the inhaler at same time you start breathing in.  Hold for up to 5 seconds if you can.  Rinse and gargle with water when done  Only use your albuterol (proair) as a rescue medication to be used if you can't catch your breath by resting or doing a relaxed purse lip breathing pattern.  - The less you use it, the better it will work when you need it. - Ok to use up to 2 puffs  every 4 hours if you must but call for immediate appointment if use goes up over your usual need - Don't leave home without it !!  (think of it like your spare tire for your car)     If you are satisfied with your treatment plan let your doctor know and he/she can either refill your medications or you can return here when your prescription runs out.     If in any way you are not 100% satisfied,  please tell us.  If 100% better, tell your friends!

## 2013-06-19 NOTE — Progress Notes (Signed)
PFT done today. 

## 2013-06-19 NOTE — Progress Notes (Signed)
  Subjective:    Patient ID: Leah [name], female    DOB: [birth year]   MRN: [id number]    Brief patient profile:  64 yowf active smoker and works at [employer] referred by Dr Laury Axon 02/04/2013 to pulmonary clinic for ? Copd GOLD II criteria 06/19/2013   History of Present Illness  02/04/2013 1st pulmonary eval on ACEi  cc recurrent spells of sob assoc with cough/congestion about 3x a year starting with nasal symptoms and spreading  Into chest last a couple weeks and resp to abx and inhalers with last spell ending a couple of weeks prior to OV >  missed 10 days of work rec     06/19/2013 f/u ov/Kieley Akter still smoking re: f/u copd on prn saba Chief Complaint  Patient presents with  . Follow-up with PFT    Pt states that her breathing is doing well and cough seems to be improved since her last appt. She still c/o sinus drainage and pressure.   Has red inhaler not using Has qvar not using Somewhat congested cough, does not wake up due to it.    Not limited by breathing or sense she needs to use inhalers at this point though listed as taking spiriva and albuterol not really using either as intended   No obvious day to day or daytime variabilty or assoc purulent or excess secretions cp or chest tightness, subjective wheeze overt  hb symptoms. No unusual exp hx or h/o childhood pna/ asthma or knowledge of premature birth.  Sleeping ok without nocturnal  or early am exacerbation  of respiratory  c/o's or need for noct saba. Also denies any obvious fluctuation of symptoms with weather or environmental changes or other aggravating or alleviating factors except as outlined above   Current Medications, Allergies, Complete Past Medical History, Past Surgical History, Family History, and Social History were reviewed in [employer] record.  ROS  The following are not active complaints unless bolded sore throat, dysphagia, dental problems, itching, sneezing,  nasal congestion or  excess/ purulent secretions, ear ache,   fever, chills, sweats, unintended wt loss, pleuritic or exertional cp, hemoptysis,  orthopnea pnd or leg swelling, presyncope, palpitations, heartburn, abdominal pain, anorexia, nausea, vomiting, diarrhea  or change in bowel or urinary habits, change in stools or urine, dysuria,hematuria,  rash, arthralgias, visual complaints, headache, numbness weakness or ataxia or problems with walking or coordination,  change in mood/affect or memory.               Objective:   Physical Exam  06/19/2013      122  Wt Readings from Last 3 Encounters:  02/04/13 122 lb 12.8 oz (55.702 kg)  01/15/13 119 lb 9.6 oz (54.25 kg)  01/10/13 121 lb (54.885 kg)     HEENT mild turbinate edema.  Oropharynx no thrush or excess pnd or cobblestoning.  No JVD or cervical adenopathy. Mild accessory muscle hypertrophy. Trachea midline, nl thryroid. Chest was hyperinflated by percussion with diminished breath sounds and moderate increased exp time without wheeze. Hoover sign positive at mid inspiration. Regular rate and rhythm without murmur gallop or rub or increase P2 or edema.  Abd: no hsm, nl excursion. Ext warm without cyanosis or clubbing.    01/10/13 cxr reviewed No acute abnormalities.  Question COPD.     Assessment & Plan:

## 2013-06-20 NOTE — Assessment & Plan Note (Addendum)
-   PFTs 06/19/13  FEV1  1.42 (66%) ratio 60 and no change p B2, DLCO 67% corrects to 77%   The proper method of use, as well as anticipated side effects, of a metered-dose inhaler are discussed and demonstrated to the patient. Improved effectiveness after extensive coaching during this visit to a level of approximately  75% so ok to continue prn saba only   As I explained to this patient in detail:  although there may be significant copd present, it does not appear to be limiting activity tolerance any more than a set of worn tires limits someone from driving a car  around a parking lot.  A new set of Michelins might look good but would have no perceived impact on the performance of the car and would not be worth the cost.  That is to say:     I don't recommend aggressive pulmonary rx at this point unless limiting symptoms arise or acute exacerbations become as issue, neither of which are the case now.  I asked the patient to contact this office at any time in the future should either of these problems arise.  In the meantime she needs to stop smoking (see sep a/p)

## 2013-06-20 NOTE — Assessment & Plan Note (Signed)
Reviewed pfts which only show moderate obstruction.  I took an extended  opportunity with this patient to outline the consequences of continued cigarette use  in airway disorders based on all the data we have from the multiple national lung health studies (perfomed over decades at millions of dollars in cost)  indicating that smoking cessation, not choice of inhalers or physicians, is the most important aspect of care.    pulmoary f/u can be prn worse symptoms

## 2013-07-03 ENCOUNTER — Encounter: Payer: Self-pay | Admitting: Internal Medicine

## 2013-09-18 ENCOUNTER — Other Ambulatory Visit: Payer: Self-pay | Admitting: Internal Medicine

## 2013-09-19 ENCOUNTER — Encounter (HOSPITAL_COMMUNITY): Payer: Self-pay | Admitting: Emergency Medicine

## 2013-09-19 ENCOUNTER — Emergency Department (HOSPITAL_COMMUNITY): Payer: BC Managed Care – PPO

## 2013-09-19 ENCOUNTER — Inpatient Hospital Stay (HOSPITAL_COMMUNITY)
Admission: EM | Admit: 2013-09-19 | Discharge: 2013-09-22 | DRG: 343 | Disposition: A | Discharge status: Home / Self Care | Payer: BC Managed Care – PPO | Attending: Surgery | Admitting: Surgery

## 2013-09-19 DIAGNOSIS — K219 Gastro-esophageal reflux disease without esophagitis: Secondary | ICD-10-CM | POA: Diagnosis present

## 2013-09-19 DIAGNOSIS — E785 Hyperlipidemia, unspecified: Secondary | ICD-10-CM | POA: Diagnosis present

## 2013-09-19 DIAGNOSIS — K449 Diaphragmatic hernia without obstruction or gangrene: Secondary | ICD-10-CM | POA: Diagnosis present

## 2013-09-19 DIAGNOSIS — F411 Generalized anxiety disorder: Secondary | ICD-10-CM | POA: Diagnosis present

## 2013-09-19 DIAGNOSIS — J4489 Other specified chronic obstructive pulmonary disease: Secondary | ICD-10-CM | POA: Diagnosis present

## 2013-09-19 DIAGNOSIS — J449 Chronic obstructive pulmonary disease, unspecified: Secondary | ICD-10-CM | POA: Diagnosis present

## 2013-09-19 DIAGNOSIS — F17200 Nicotine dependence, unspecified, uncomplicated: Secondary | ICD-10-CM | POA: Diagnosis present

## 2013-09-19 DIAGNOSIS — D509 Iron deficiency anemia, unspecified: Secondary | ICD-10-CM | POA: Diagnosis present

## 2013-09-19 DIAGNOSIS — F329 Major depressive disorder, single episode, unspecified: Secondary | ICD-10-CM | POA: Diagnosis present

## 2013-09-19 DIAGNOSIS — K37 Unspecified appendicitis: Secondary | ICD-10-CM | POA: Diagnosis present

## 2013-09-19 DIAGNOSIS — K224 Dyskinesia of esophagus: Secondary | ICD-10-CM | POA: Diagnosis present

## 2013-09-19 DIAGNOSIS — R109 Unspecified abdominal pain: Secondary | ICD-10-CM

## 2013-09-19 DIAGNOSIS — K3580 Unspecified acute appendicitis: Principal | ICD-10-CM | POA: Diagnosis present

## 2013-09-19 DIAGNOSIS — F3289 Other specified depressive episodes: Secondary | ICD-10-CM | POA: Diagnosis present

## 2013-09-19 DIAGNOSIS — K2940 Chronic atrophic gastritis without bleeding: Secondary | ICD-10-CM | POA: Diagnosis present

## 2013-09-19 DIAGNOSIS — E538 Deficiency of other specified B group vitamins: Secondary | ICD-10-CM | POA: Diagnosis present

## 2013-09-19 DIAGNOSIS — K589 Irritable bowel syndrome without diarrhea: Secondary | ICD-10-CM | POA: Diagnosis present

## 2013-09-19 LAB — COMPREHENSIVE METABOLIC PANEL
ALBUMIN: 3.8 g/dL (ref 3.5–5.2)
ALK PHOS: 79 U/L (ref 39–117)
ALT: 8 U/L (ref 0–35)
AST: 12 U/L (ref 0–37)
BUN: 11 mg/dL (ref 6–23)
CALCIUM: 9.4 mg/dL (ref 8.4–10.5)
CO2: 27 mEq/L (ref 19–32)
Chloride: 99 mEq/L (ref 96–112)
Creatinine, Ser: 0.7 mg/dL (ref 0.50–1.10)
GFR calc non Af Amer: 89 mL/min — ABNORMAL LOW (ref >90)
Glucose, Bld: 95 mg/dL (ref 70–99)
POTASSIUM: 4.1 meq/L (ref 3.7–5.3)
Sodium: 139 mEq/L (ref 137–147)
TOTAL PROTEIN: 8 g/dL (ref 6.0–8.3)
Total Bilirubin: 0.3 mg/dL (ref 0.3–1.2)

## 2013-09-19 LAB — URINALYSIS, ROUTINE W REFLEX MICROSCOPIC
BILIRUBIN URINE: NEGATIVE
GLUCOSE, UA: NEGATIVE mg/dL
HGB URINE DIPSTICK: NEGATIVE
Ketones, ur: NEGATIVE mg/dL
Leukocytes, UA: NEGATIVE
NITRITE: NEGATIVE
PH: 5 (ref 5.0–8.0)
Protein, ur: NEGATIVE mg/dL
SPECIFIC GRAVITY, URINE: 1.024 (ref 1.005–1.030)
Urobilinogen, UA: 0.2 mg/dL (ref 0.0–1.0)

## 2013-09-19 LAB — CBC WITH DIFFERENTIAL/PLATELET
BASOS ABS: 0.1 10*3/uL (ref 0.0–0.1)
BASOS PCT: 1 % (ref 0–1)
EOS ABS: 0.3 10*3/uL (ref 0.0–0.7)
EOS PCT: 5 % (ref 0–5)
HCT: 42.2 % (ref 36.0–46.0)
Hemoglobin: 14 g/dL (ref 12.0–15.0)
Lymphocytes Relative: 29 % (ref 12–46)
Lymphs Abs: 1.6 10*3/uL (ref 0.7–4.0)
MCH: 29.8 pg (ref 26.0–34.0)
MCHC: 33.2 g/dL (ref 30.0–36.0)
MCV: 89.8 fL (ref 78.0–100.0)
Monocytes Absolute: 0.3 10*3/uL (ref 0.1–1.0)
Monocytes Relative: 5 % (ref 3–12)
NEUTROS PCT: 61 % (ref 43–77)
Neutro Abs: 3.4 10*3/uL (ref 1.7–7.7)
PLATELETS: 237 10*3/uL (ref 150–400)
RBC: 4.7 MIL/uL (ref 3.87–5.11)
RDW: 13.9 % (ref 11.5–15.5)
WBC: 5.6 10*3/uL (ref 4.0–10.5)

## 2013-09-19 MED ORDER — ONDANSETRON HCL 4 MG/2ML IJ SOLN
4.0000 mg | Freq: Four times a day (QID) | INTRAMUSCULAR | Status: DC | PRN
Start: 2013-09-19 — End: 2013-09-22
  Administered 2013-09-20: 4 mg via INTRAVENOUS
  Filled 2013-09-19: qty 2

## 2013-09-19 MED ORDER — IOHEXOL 300 MG/ML  SOLN
100.0000 mL | Freq: Once | INTRAMUSCULAR | Status: AC | PRN
  Administered 2013-09-19: 100 mL via INTRAVENOUS

## 2013-09-19 MED ORDER — HEPARIN SODIUM (PORCINE) 5000 UNIT/ML IJ SOLN
5000.0000 [IU] | Freq: Three times a day (TID) | INTRAMUSCULAR | Status: DC
  Administered 2013-09-20 – 2013-09-22 (×5): 5000 [IU] via SUBCUTANEOUS
  Filled 2013-09-19 (×10): qty 1

## 2013-09-19 MED ORDER — IOHEXOL 300 MG/ML  SOLN
25.0000 mL | INTRAMUSCULAR | Status: AC
  Administered 2013-09-19: 25 mL via ORAL

## 2013-09-19 MED ORDER — CLINDAMYCIN PHOSPHATE 600 MG/50ML IV SOLN
600.0000 mg | Freq: Three times a day (TID) | INTRAVENOUS | Status: DC
  Administered 2013-09-20 – 2013-09-21 (×5): 600 mg via INTRAVENOUS
  Filled 2013-09-19 (×8): qty 50

## 2013-09-19 MED ORDER — HYDROMORPHONE HCL PF 1 MG/ML IJ SOLN
1.0000 mg | Freq: Once | INTRAMUSCULAR | Status: AC
  Administered 2013-09-19: 1 mg via INTRAVENOUS
  Filled 2013-09-19: qty 1

## 2013-09-19 MED ORDER — ONDANSETRON HCL 4 MG/2ML IJ SOLN
4.0000 mg | Freq: Once | INTRAMUSCULAR | Status: AC
  Administered 2013-09-19: 4 mg via INTRAVENOUS
  Filled 2013-09-19: qty 2

## 2013-09-19 MED ORDER — DEXTROSE 5 % IV SOLN
1.0000 g | Freq: Three times a day (TID) | INTRAVENOUS | Status: DC
  Administered 2013-09-20 – 2013-09-21 (×6): 1 g via INTRAVENOUS
  Filled 2013-09-19 (×8): qty 1

## 2013-09-19 MED ORDER — MORPHINE SULFATE 2 MG/ML IJ SOLN
2.0000 mg | INTRAMUSCULAR | Status: DC | PRN
  Administered 2013-09-20 – 2013-09-21 (×3): 2 mg via INTRAVENOUS
  Filled 2013-09-19 (×3): qty 1

## 2013-09-19 MED ORDER — PANTOPRAZOLE SODIUM 40 MG IV SOLR
40.0000 mg | Freq: Every day | INTRAVENOUS | Status: DC
  Administered 2013-09-20 – 2013-09-21 (×3): 40 mg via INTRAVENOUS
  Filled 2013-09-19 (×4): qty 40

## 2013-09-19 MED ORDER — SODIUM CHLORIDE 0.9 % IV SOLN
INTRAVENOUS | Status: DC
  Administered 2013-09-20 – 2013-09-21 (×3): via INTRAVENOUS
  Administered 2013-09-22: 1000 mL via INTRAVENOUS

## 2013-09-19 MED ORDER — MORPHINE SULFATE 4 MG/ML IJ SOLN
4.0000 mg | Freq: Once | INTRAMUSCULAR | Status: AC
  Administered 2013-09-19: 4 mg via INTRAVENOUS
  Filled 2013-09-19: qty 1

## 2013-09-19 NOTE — Progress Notes (Signed)
ANTIBIOTIC CONSULT NOTE - INITIAL  Pharmacy Consult for Aztreonam  Indication: Appendicitis (PCN/FQ allergy)  Allergies  Allergen Reactions  . Amoxicillin-Pot Clavulanate Shortness Of Breath, Itching and Swelling  . Ciprofloxacin Shortness Of Breath, Itching and Swelling  . Moxifloxacin Shortness Of Breath, Itching and Swelling  . Penicillins Shortness Of Breath, Itching and Swelling  . Prednisone Other (See Comments)    GI irritation  . Sulfonamide Derivatives Shortness Of Breath, Itching and Swelling    Patient Measurements: Height: 5\' 1"  (154.9 cm) Weight: 118 lb (53.524 kg) IBW/kg (Calculated) : 47.8  Vital Signs: Temp: 98 F (36.7 C) (02/20 2337) Temp src: Oral (02/20 2300) BP: 107/51 mmHg (02/20 2330) Pulse Rate: 65 (02/20 2330)  Labs:  Recent Labs  09/19/13 1610  WBC 5.6  HGB 14.0  PLT 237  CREATININE 0.70   Estimated Creatinine Clearance: 52.9 ml/min (by C-G formula based on Cr of 0.7). No results found for this basename: VANCOTROUGH, VANCOPEAK, VANCORANDOM, GENTTROUGH, GENTPEAK, GENTRANDOM, TOBRATROUGH, TOBRAPEAK, TOBRARND, AMIKACINPEAK, AMIKACINTROU, AMIKACIN,  in the last 72 hours   Microbiology: No results found for this or any previous visit (from the past 720 hour(s)).  Medical History: Past Medical History  Diagnosis Date  . Esophageal spasm   . GERD (gastroesophageal reflux disease)   . Chronic gastritis   . Diverticulosis   . B12 deficiency   . IBS (irritable bowel syndrome)   . Anxiety   . Depression   . Hiatal hernia   . Iron deficiency anemia   . Hyperlipidemia   . Diverticulitis   . Barrett esophagus   . Rosacea   . Interstitial cystitis     Assessment: 67 y/o F with appendicitis to start clinda/azteonam in the setting of PCN/FQ allergy. WBC wnl, other labs as above.   Goal of Therapy:  Clinical resolution   Plan:  -Aztreonam 1g IV q8h -Clindamycin per MD -Trend WBC, temp, renal function   Narda Bonds 09/19/2013,11:55  PM

## 2013-09-19 NOTE — ED Notes (Signed)
Pt in c/o RLQ abd pain since Tuesday morning, episode of vomiting x1, denies other symptoms, pt states pain is getting progressively worse

## 2013-09-19 NOTE — ED Notes (Signed)
Surgeon at bedside.  

## 2013-09-19 NOTE — ED Provider Notes (Signed)
Pt received from Dr. Ashok Cordia.  Pt presented to ED w/ 4 days of RLQ pain.  Labs unremarkable.  CT shows uncomplicated, early acute appendicitis.  Results discussed w/ pt.  She is currently asymptomatic and VSS.  GS consulted to admission.  9:18 PM   Remer Macho, PA-C 09/19/13 2119

## 2013-09-19 NOTE — H&P (Signed)
Leah [name] is an 68 y.o. female.   Chief Complaint: abdominal pain HPI: 9 yof with multiple medical problems including copd, active smoker and history of sigmoidectomy for diverticular disease who presents with rlq pain since Tuesday. Hasnt really changed over that time and came today just because was still present.  Had some nausea and emesis with contrast she drank today.  Otherwise ate fine yesterday and didn't today just because she has been in urgent care and er.  She was hungry.  She has been having bms and passing flatus her normal way.  She has colonoscopy in recent past with Dr Olevia Perches that had some polyps but otherwise ok.She underwent a ct scan that shows early appendicitis.  Past Medical History  Diagnosis Date  . Esophageal spasm   . GERD (gastroesophageal reflux disease)   . Chronic gastritis   . Diverticulosis   . B12 deficiency   . IBS (irritable bowel syndrome)   . Anxiety   . Depression   . Hiatal hernia   . Iron deficiency anemia   . Hyperlipidemia   . Diverticulitis   . Barrett esophagus   . Rosacea   . Interstitial cystitis     Past Surgical History  Procedure Laterality Date  . Tonsillectomy and adenoidectomy    . Tmj arthroplasty    . Vocal cord polypectomy    . Carpal tunnel release  1993    right  . Abdominal hysterectomy    . Cholecystectomy    . Partial colectomy    . Sinus surgery with instatrak      Family History  Problem Relation Age of Onset  . Colon cancer Neg Hx   . Pancreatic cancer Mother   . Pancreatic cancer Father   . Colon polyps Mother   . Diabetes Father   . Heart disease Father   . Leukemia Father   . Prostate cancer Father   . Hypertension Father    Social History:  reports that she has been smoking Cigarettes.  She has a 22.5 pack-year smoking history. She has never used smokeless tobacco. She reports that she does not drink alcohol or use illicit drugs.  Allergies:  Allergies  Allergen Reactions  .  Amoxicillin-Pot Clavulanate Shortness Of Breath, Itching and Swelling  . Ciprofloxacin Shortness Of Breath, Itching and Swelling  . Moxifloxacin Shortness Of Breath, Itching and Swelling  . Penicillins Shortness Of Breath, Itching and Swelling  . Prednisone Other (See Comments)    GI irritation  . Sulfonamide Derivatives Shortness Of Breath, Itching and Swelling    meds reviewed   Results for orders placed during the hospital encounter of 09/19/13 (from the past 48 hour(s))  CBC WITH DIFFERENTIAL     Status: None   Collection Time    09/19/13  4:10 PM      Result Value Ref Range   WBC 5.6  4.0 - 10.5 K/uL   RBC 4.70  3.87 - 5.11 MIL/uL   Hemoglobin 14.0  12.0 - 15.0 g/dL   HCT 42.2  36.0 - 46.0 %   MCV 89.8  78.0 - 100.0 fL   MCH 29.8  26.0 - 34.0 pg   MCHC 33.2  30.0 - 36.0 g/dL   RDW 13.9  11.5 - 15.5 %   Platelets 237  150 - 400 K/uL   Neutrophils Relative % 61  43 - 77 %   Neutro Abs 3.4  1.7 - 7.7 K/uL   Lymphocytes Relative 29  12 - 46 %  Lymphs Abs 1.6  0.7 - 4.0 K/uL   Monocytes Relative 5  3 - 12 %   Monocytes Absolute 0.3  0.1 - 1.0 K/uL   Eosinophils Relative 5  0 - 5 %   Eosinophils Absolute 0.3  0.0 - 0.7 K/uL   Basophils Relative 1  0 - 1 %   Basophils Absolute 0.1  0.0 - 0.1 K/uL  COMPREHENSIVE METABOLIC PANEL     Status: Abnormal   Collection Time    09/19/13  4:10 PM      Result Value Ref Range   Sodium 139  137 - 147 mEq/L   Potassium 4.1  3.7 - 5.3 mEq/L   Chloride 99  96 - 112 mEq/L   CO2 27  19 - 32 mEq/L   Glucose, Bld 95  70 - 99 mg/dL   BUN 11  6 - 23 mg/dL   Creatinine, Ser 0.70  0.50 - 1.10 mg/dL   Calcium 9.4  8.4 - 10.5 mg/dL   Total Protein 8.0  6.0 - 8.3 g/dL   Albumin 3.8  3.5 - 5.2 g/dL   AST 12  0 - 37 U/L   ALT 8  0 - 35 U/L   Alkaline Phosphatase 79  39 - 117 U/L   Total Bilirubin 0.3  0.3 - 1.2 mg/dL   GFR calc non Af Amer 89 (*) >90 mL/min   GFR calc Af Amer >90  >90 mL/min   Comment: (NOTE)     The eGFR has been  calculated using the CKD EPI equation.     This calculation has not been validated in all clinical situations.     eGFR's persistently <90 mL/min signify possible Chronic Kidney     Disease.  URINALYSIS, ROUTINE W REFLEX MICROSCOPIC     Status: Abnormal   Collection Time    09/19/13  4:27 PM      Result Value Ref Range   Color, Urine YELLOW  YELLOW   APPearance HAZY (*) CLEAR   Specific Gravity, Urine 1.024  1.005 - 1.030   pH 5.0  5.0 - 8.0   Glucose, UA NEGATIVE  NEGATIVE mg/dL   Hgb urine dipstick NEGATIVE  NEGATIVE   Bilirubin Urine NEGATIVE  NEGATIVE   Ketones, ur NEGATIVE  NEGATIVE mg/dL   Protein, ur NEGATIVE  NEGATIVE mg/dL   Urobilinogen, UA 0.2  0.0 - 1.0 mg/dL   Nitrite NEGATIVE  NEGATIVE   Leukocytes, UA NEGATIVE  NEGATIVE   Comment: MICROSCOPIC NOT DONE ON URINES WITH NEGATIVE PROTEIN, BLOOD, LEUKOCYTES, NITRITE, OR GLUCOSE <1000 mg/dL.   Ct Abdomen Pelvis W Contrast  09/19/2013   CLINICAL DATA:  Right lower quadrant pain and tenderness. Clinical suspicion for appendicitis.  EXAM: CT ABDOMEN AND PELVIS WITH CONTRAST  TECHNIQUE: Multidetector CT imaging of the abdomen and pelvis was performed using the standard protocol following bolus administration of intravenous contrast.  CONTRAST:  17m OMNIPAQUE IOHEXOL 300 MG/ML  SOLN  COMPARISON:  07/05/2010  FINDINGS: Surgical clips from prior cholecystectomy noted. No evidence of biliary ductal dilatation. Small hepatic cysts again noted but no liver masses are identified.  The pancreas, spleen, adrenal glands, and kidneys are normal in appearance. No soft tissue masses or lymphadenopathy identified within the abdomen or pelvis. Prior hysterectomy noted. Adnexal regions are unremarkable in appearance.  Mild diffuse wall thickening is seen involving the appendix, consistent with acute appendicitis. No evidence of abscess. No evidence of bowel obstruction. No other inflammatory process identified.  IMPRESSION: Findings  consistent with  early acute appendicitis. No evidence of abscess or other complication.   Electronically Signed   By: Earle Gell M.D.   On: 09/19/2013 20:53    Review of Systems  Constitutional: Negative for fever, chills and malaise/fatigue.  Respiratory: Negative for shortness of breath.   Cardiovascular: Negative for chest pain.  Gastrointestinal: Positive for nausea, vomiting and abdominal pain. Negative for diarrhea, constipation and blood in stool.    Blood pressure 124/51, pulse 75, temperature 98.8 F (37.1 C), temperature source Oral, resp. rate 16, weight 118 lb 8 oz (53.751 kg), SpO2 90.00%. Physical Exam  Vitals reviewed. Constitutional: She is oriented to person, place, and time. She appears well-developed and well-nourished.  Eyes: No scleral icterus.  Neck: Neck supple.  Cardiovascular: Normal rate, regular rhythm and normal heart sounds.   Respiratory: Effort normal and breath sounds normal. She has no wheezes. She has no rales.  GI: Soft. Bowel sounds are normal. She exhibits no distension. There is tenderness (mild) in the right lower quadrant. No hernia.    Musculoskeletal: Normal range of motion. She exhibits no edema.  Lymphadenopathy:    She has no cervical adenopathy.  Neurological: She is alert and oriented to person, place, and time.  Skin: Skin is warm and dry.     Assessment/Plan Appendicitis  I would think her ct would show more given symptoms.  It has contrast in a portion of appendix it appears to me but is thickened.  Her wbc is normal.  We discussed surgery as treatment for this.  Will place on abx (clinda and aztreonam due to her allergies) and plan for surgery tomorrow.  I suppose if she is completely resolved could manage her with abx alone.  Will reassess in am.  We discussed surgery and risks associated with it.  Malcolm Quast 09/19/2013, 9:59 PM

## 2013-09-19 NOTE — ED Provider Notes (Signed)
CSN: [id number]     Arrival date & time 09/19/13  1538 History   First MD Initiated Contact with Patient 09/19/13 1721     Chief Complaint  Patient presents with  . Abdominal Pain     (Consider location/radiation/quality/duration/timing/severity/associated sxs/prior Treatment) Patient is a 68 y.o. female presenting with abdominal pain. The history is provided by the patient.  Abdominal Pain Associated symptoms: no chest pain, no chills, no constipation, no cough, no diarrhea, no dysuria, no fever, no shortness of breath and no sore throat   pt c/o rlq abd pain for the past 3 days. Constant. Dull. Non radiating.  Nausea, vomiting x 1, not bloody or bilious. Has had reasonably normal appetite since. No diarrhea, had normal bm today. Denies fever or chills. No dysuria or gu c/o. Prior abd surgery includes remote hx partial colectomy (re diverticula/itis), hysterectomy, cholecystectomy.     Past Medical History  Diagnosis Date  . Esophageal spasm   . GERD (gastroesophageal reflux disease)   . Chronic gastritis   . Diverticulosis   . B12 deficiency   . IBS (irritable bowel syndrome)   . Anxiety   . Depression   . Hiatal hernia   . Iron deficiency anemia   . Hyperlipidemia   . Diverticulitis   . Barrett esophagus   . Rosacea   . Interstitial cystitis    Past Surgical History  Procedure Laterality Date  . Tonsillectomy and adenoidectomy    . Tmj arthroplasty    . Vocal cord polypectomy    . Carpal tunnel release  1993    right  . Abdominal hysterectomy    . Cholecystectomy    . Partial colectomy    . Sinus surgery with instatrak     Family History  Problem Relation Age of Onset  . Colon cancer Neg Hx   . Pancreatic cancer Mother   . Pancreatic cancer Father   . Colon polyps Mother   . Diabetes Father   . Heart disease Father   . Leukemia Father   . Prostate cancer Father   . Hypertension Father    History  Substance Use Topics  . Smoking status: Current Every Day  Smoker -- 0.50 packs/day for 45 years    Types: Cigarettes  . Smokeless tobacco: Never Used  . Alcohol Use: No   OB History   Grav Para Term Preterm Abortions TAB SAB Ect Mult Living                 Review of Systems  Constitutional: Negative for fever and chills.  HENT: Negative for sore throat.   Eyes: Negative for redness.  Respiratory: Negative for cough and shortness of breath.   Cardiovascular: Negative for chest pain.  Gastrointestinal: Positive for abdominal pain. Negative for diarrhea and constipation.  Genitourinary: Negative for dysuria and flank pain.  Musculoskeletal: Negative for back pain and neck pain.  Skin: Negative for rash.  Neurological: Negative for headaches.  Hematological: Does not bruise/bleed easily.  Psychiatric/Behavioral: Negative for confusion.      Allergies  Amoxicillin-pot clavulanate; Ciprofloxacin; Moxifloxacin; Penicillins; Prednisone; and Sulfonamide derivatives  Home Medications   Current Outpatient Rx  Name  Route  Sig  Dispense  Refill  . acetaminophen (TYLENOL) 500 MG tablet   Oral   Take 500 mg by mouth every 6 (six) hours as needed.           Marland Kitchen albuterol (PROAIR HFA) 108 (90 BASE) MCG/ACT inhaler   Inhalation   Inhale 2  puffs into the lungs every 6 (six) hours as needed for wheezing.   1 Inhaler   0   . cetirizine (ZYRTEC) 10 MG tablet   Oral   Take 10 mg by mouth daily.         Marland Kitchen dicyclomine (BENTYL) 10 MG capsule      Take 1 capsule by mouth three times daily as needed for cramping and spasms.   30 capsule   0     Please keep 09/25/13 office visit with Gunnar Fusi for fu ...   . esomeprazole (NEXIUM) 40 MG capsule   Oral   Take 40 mg by mouth 2 (two) times daily before a meal.         . estradiol (VIVELLE-DOT) 0.1 MG/24HR   Transdermal   Place 1 patch onto the skin 2 (two) times a week.           Marland Kitchen LORazepam (ATIVAN) 0.5 MG tablet   Oral   Take 0.5 mg by mouth at bedtime. At bedtime          .  sertraline (ZOLOFT) 50 MG tablet   Oral   Take 50 mg by mouth at bedtime.         . simvastatin (ZOCOR) 20 MG tablet   Oral   Take 20 mg by mouth daily at 6 PM.          BP 142/64  Pulse 76  Temp(Src) 98.6 F (37 C) (Oral)  Resp 20  Wt 118 lb 8 oz (53.751 kg)  SpO2 99% Physical Exam  Nursing note and vitals reviewed. Constitutional: She appears well-developed and well-nourished. No distress.  HENT:  Mouth/Throat: Oropharynx is clear and moist.  Eyes: Conjunctivae are normal. No scleral icterus.  Neck: Neck supple. No tracheal deviation present.  Cardiovascular: Normal rate, regular rhythm, normal heart sounds and intact distal pulses.   Pulmonary/Chest: Effort normal and breath sounds normal. No respiratory distress.  Abdominal: Soft. Normal appearance and bowel sounds are normal. She exhibits no distension and no mass. There is tenderness. There is no rebound and no guarding.  Moderate rlq tenderness.  No incarc hernia, well healed surgical scars.   Genitourinary:  No cva tenderness  Musculoskeletal: She exhibits no edema.  Neurological: She is alert.  Skin: Skin is warm and dry. No rash noted. She is not diaphoretic.  Psychiatric: She has a normal mood and affect.    ED Course  Procedures (including critical care time)  Results for orders placed during the hospital encounter of 09/19/13  CBC WITH DIFFERENTIAL      Result Value Ref Range   WBC 5.6  4.0 - 10.5 K/uL   RBC 4.70  3.87 - 5.11 MIL/uL   Hemoglobin 14.0  12.0 - 15.0 g/dL   HCT 09.3  26.7 - 12.4 %   MCV 89.8  78.0 - 100.0 fL   MCH 29.8  26.0 - 34.0 pg   MCHC 33.2  30.0 - 36.0 g/dL   RDW 58.0  99.8 - 33.8 %   Platelets 237  150 - 400 K/uL   Neutrophils Relative % 61  43 - 77 %   Neutro Abs 3.4  1.7 - 7.7 K/uL   Lymphocytes Relative 29  12 - 46 %   Lymphs Abs 1.6  0.7 - 4.0 K/uL   Monocytes Relative 5  3 - 12 %   Monocytes Absolute 0.3  0.1 - 1.0 K/uL   Eosinophils Relative 5  0 - 5 %  Eosinophils  Absolute 0.3  0.0 - 0.7 K/uL   Basophils Relative 1  0 - 1 %   Basophils Absolute 0.1  0.0 - 0.1 K/uL  COMPREHENSIVE METABOLIC PANEL      Result Value Ref Range   Sodium 139  137 - 147 mEq/L   Potassium 4.1  3.7 - 5.3 mEq/L   Chloride 99  96 - 112 mEq/L   CO2 27  19 - 32 mEq/L   Glucose, Bld 95  70 - 99 mg/dL   BUN 11  6 - 23 mg/dL   Creatinine, Ser 0.70  0.50 - 1.10 mg/dL   Calcium 9.4  8.4 - 10.5 mg/dL   Total Protein 8.0  6.0 - 8.3 g/dL   Albumin 3.8  3.5 - 5.2 g/dL   AST 12  0 - 37 U/L   ALT 8  0 - 35 U/L   Alkaline Phosphatase 79  39 - 117 U/L   Total Bilirubin 0.3  0.3 - 1.2 mg/dL   GFR calc non Af Amer 89 (*) >90 mL/min   GFR calc Af Amer >90  >90 mL/min  URINALYSIS, ROUTINE W REFLEX MICROSCOPIC      Result Value Ref Range   Color, Urine YELLOW  YELLOW   APPearance HAZY (*) CLEAR   Specific Gravity, Urine 1.024  1.005 - 1.030   pH 5.0  5.0 - 8.0   Glucose, UA NEGATIVE  NEGATIVE mg/dL   Hgb urine dipstick NEGATIVE  NEGATIVE   Bilirubin Urine NEGATIVE  NEGATIVE   Ketones, ur NEGATIVE  NEGATIVE mg/dL   Protein, ur NEGATIVE  NEGATIVE mg/dL   Urobilinogen, UA 0.2  0.0 - 1.0 mg/dL   Nitrite NEGATIVE  NEGATIVE   Leukocytes, UA NEGATIVE  NEGATIVE       MDM  Iv ns. Labs. Morphine iv. zofran iv.  Reviewed nursing notes and prior charts for additional history.   2045 ct pending,  Signed out to [place] and MD Bednar to check ct when back, recheck pt, dispo appropriately.    Mirna Mires, MD 09/19/13 [phone]

## 2013-09-20 ENCOUNTER — Encounter (HOSPITAL_COMMUNITY): Payer: Self-pay | Admitting: *Deleted

## 2013-09-20 ENCOUNTER — Encounter (HOSPITAL_COMMUNITY): Admission: EM | Disposition: A | Discharge status: Home / Self Care | Payer: Self-pay | Source: Home / Self Care

## 2013-09-20 ENCOUNTER — Encounter (HOSPITAL_COMMUNITY): Payer: Self-pay | Admitting: Anesthesiology

## 2013-09-20 SURGERY — APPENDECTOMY, LAPAROSCOPIC
Anesthesia: General

## 2013-09-20 MED ORDER — NICOTINE 21 MG/24HR TD PT24
21.0000 mg | MEDICATED_PATCH | Freq: Every day | TRANSDERMAL | Status: DC
  Administered 2013-09-20 – 2013-09-22 (×3): 21 mg via TRANSDERMAL
  Filled 2013-09-20 (×3): qty 1

## 2013-09-20 MED ORDER — ACETAMINOPHEN 325 MG PO TABS
650.0000 mg | ORAL_TABLET | Freq: Four times a day (QID) | ORAL | Status: DC | PRN
  Administered 2013-09-20 (×2): 650 mg via ORAL
  Filled 2013-09-20 (×2): qty 2

## 2013-09-20 MED ORDER — ACETAMINOPHEN 325 MG PO TABS
650.0000 mg | ORAL_TABLET | Freq: Once | ORAL | Status: AC
  Administered 2013-09-20: 650 mg via ORAL
  Filled 2013-09-20: qty 2

## 2013-09-20 SURGICAL SUPPLY — 37 items
APPLIER CLIP ROT 10 11.4 M/L (STAPLE)
BLADE SURG ROTATE 9660 (MISCELLANEOUS) IMPLANT
CANISTER SUCTION 2500CC (MISCELLANEOUS) ×2 IMPLANT
CHLORAPREP W/TINT 26ML (MISCELLANEOUS) ×2 IMPLANT
CLIP APPLIE ROT 10 11.4 M/L (STAPLE) IMPLANT
COVER SURGICAL LIGHT HANDLE (MISCELLANEOUS) ×2 IMPLANT
CUTTER FLEX LINEAR 45M (STAPLE) ×2 IMPLANT
DERMABOND ADVANCED (GAUZE/BANDAGES/DRESSINGS) ×1
DERMABOND ADVANCED .7 DNX12 (GAUZE/BANDAGES/DRESSINGS) ×1 IMPLANT
DRAPE UTILITY 15X26 W/TAPE STR (DRAPE) ×4 IMPLANT
DRAPE WARM FLUID 44X44 (DRAPE) ×2 IMPLANT
ELECT REM PT RETURN 9FT ADLT (ELECTROSURGICAL) ×2
ELECTRODE REM PT RTRN 9FT ADLT (ELECTROSURGICAL) ×1 IMPLANT
ENDOLOOP SUT PDS II  0 18 (SUTURE)
ENDOLOOP SUT PDS II 0 18 (SUTURE) IMPLANT
GLOVE BIO SURGEON STRL SZ8 (GLOVE) ×2 IMPLANT
GLOVE BIOGEL PI IND STRL 8 (GLOVE) ×1 IMPLANT
GLOVE BIOGEL PI INDICATOR 8 (GLOVE) ×1
GOWN STRL NON-REIN LRG LVL3 (GOWN DISPOSABLE) ×4 IMPLANT
GOWN STRL REIN XL XLG (GOWN DISPOSABLE) ×2 IMPLANT
KIT BASIN OR (CUSTOM PROCEDURE TRAY) ×2 IMPLANT
KIT ROOM TURNOVER OR (KITS) ×2 IMPLANT
NS IRRIG 1000ML POUR BTL (IV SOLUTION) ×2 IMPLANT
PAD ARMBOARD 7.5X6 YLW CONV (MISCELLANEOUS) ×4 IMPLANT
POUCH SPECIMEN RETRIEVAL 10MM (ENDOMECHANICALS) ×2 IMPLANT
RELOAD STAPLE TA45 3.5 REG BLU (ENDOMECHANICALS) ×2 IMPLANT
SCALPEL HARMONIC ACE (MISCELLANEOUS) ×2 IMPLANT
SET IRRIG TUBING LAPAROSCOPIC (IRRIGATION / IRRIGATOR) ×2 IMPLANT
SPECIMEN JAR SMALL (MISCELLANEOUS) ×2 IMPLANT
SUT MON AB 4-0 PC3 18 (SUTURE) ×2 IMPLANT
TOWEL OR 17X24 6PK STRL BLUE (TOWEL DISPOSABLE) ×2 IMPLANT
TOWEL OR 17X26 10 PK STRL BLUE (TOWEL DISPOSABLE) ×2 IMPLANT
TRAY FOLEY CATH 16FR SILVER (SET/KITS/TRAYS/PACK) ×2 IMPLANT
TRAY LAPAROSCOPIC (CUSTOM PROCEDURE TRAY) ×2 IMPLANT
TROCAR XCEL BLADELESS 5X75MML (TROCAR) ×4 IMPLANT
TROCAR XCEL BLUNT TIP 100MML (ENDOMECHANICALS) ×2 IMPLANT
WATER STERILE IRR 1000ML POUR (IV SOLUTION) IMPLANT

## 2013-09-20 NOTE — ED Provider Notes (Signed)
Medical screening examination/treatment/procedure(s) were conducted as a shared visit with non-physician practitioner(s) and myself.  I personally evaluated the patient during the encounter.    See H and P by me.   Mirna Mires, MD 09/20/13 [phone]

## 2013-09-20 NOTE — Progress Notes (Signed)
Day of Surgery  Subjective:has a HA.  Abdominal pain about the same.   Objective: Vital signs in last 24 hours: Temp:  [98 F (36.7 C)-98.8 F (37.1 C)] 98 F (36.7 C) (02/21 0536) Pulse Rate:  [58-95] 63 (02/21 0536) Resp:  [16-20] 16 (02/21 0536) BP: (100-150)/(51-73) 108/70 mmHg (02/21 0536) SpO2:  [88 %-100 %] 93 % (02/21 0536) Weight:  [118 lb (53.524 kg)-118 lb 8 oz (53.751 kg)] 118 lb 3.2 oz (53.615 kg) (02/21 0048) Last BM Date: 09/18/13  Intake/Output from previous day: 02/20 0701 - 02/21 0700 In: 593.8 [I.V.:493.8; IV Piggyback:100] Out: -  Intake/Output this shift:    GI: soft with mild RLQ tenderness without peritonitis. no guarding no rebound.   Lab Results:   Recent Labs  09/19/13 1610  WBC 5.6  HGB 14.0  HCT 42.2  PLT 237   BMET  Recent Labs  09/19/13 1610  NA 139  K 4.1  CL 99  CO2 27  GLUCOSE 95  BUN 11  CREATININE 0.70  CALCIUM 9.4   PT/INR No results found for this basename: LABPROT, INR,  in the last 72 hours ABG No results found for this basename: PHART, PCO2, PO2, HCO3,  in the last 72 hours  Studies/Results: Ct Abdomen Pelvis W Contrast  09/19/2013   CLINICAL DATA:  Right lower quadrant pain and tenderness. Clinical suspicion for appendicitis.  EXAM: CT ABDOMEN AND PELVIS WITH CONTRAST  TECHNIQUE: Multidetector CT imaging of the abdomen and pelvis was performed using the standard protocol following bolus administration of intravenous contrast.  CONTRAST:  170mL OMNIPAQUE IOHEXOL 300 MG/ML  SOLN  COMPARISON:  07/05/2010  FINDINGS: Surgical clips from prior cholecystectomy noted. No evidence of biliary ductal dilatation. Small hepatic cysts again noted but no liver masses are identified.  The pancreas, spleen, adrenal glands, and kidneys are normal in appearance. No soft tissue masses or lymphadenopathy identified within the abdomen or pelvis. Prior hysterectomy noted. Adnexal regions are unremarkable in appearance.  Mild diffuse wall  thickening is seen involving the appendix, consistent with acute appendicitis. No evidence of abscess. No evidence of bowel obstruction. No other inflammatory process identified.  IMPRESSION: Findings consistent with early acute appendicitis. No evidence of abscess or other complication.   Electronically Signed   By: Earle Gell M.D.   On: 09/19/2013 20:53    Anti-infectives: Anti-infectives   Start     Dose/Rate Route Frequency Ordered Stop   09/20/13 0100  clindamycin (CLEOCIN) IVPB 600 mg     600 mg 100 mL/hr over 30 Minutes Intravenous 3 times per day 09/19/13 2351     09/20/13 0000  aztreonam (AZACTAM) 1 g in dextrose 5 % 50 mL IVPB     1 g 100 mL/hr over 30 Minutes Intravenous 3 times per day 09/19/13 2357        Assessment/Plan: Appendicitis with atypical presentation. She is no worse today and exam is stable.  Discussed trial of medical management vs appendectomy with her today.  Risk,  Benefits and long term outcomes of each discussed.   She will think about this and let me know.  If she wishes to proceed with appendectomy will do so later today.   LOS: 1 day    Ellie Spickler A. 09/20/2013

## 2013-09-20 NOTE — Progress Notes (Signed)
Patient ID: Leah [name], female   DOB: 1946/02/28, 68 y.o.   MRN: [id number] Pt decided to continue antibiotic treatment for another 24 hours after  Discussion. Will  allow clears  For today and make NPO after midnight.  Reassess in am.

## 2013-09-20 NOTE — Anesthesia Preprocedure Evaluation (Deleted)
Anesthesia Evaluation  Patient identified by MRN, date of birth, ID band Patient awake    Reviewed: Allergy & Precautions, H&P , NPO status , Patient's Chart, lab work & pertinent test results  Airway       Dental   Pulmonary COPDCurrent Smoker,          Cardiovascular hypertension,     Neuro/Psych  Neuromuscular disease    GI/Hepatic hiatal hernia, GERD-  ,  Endo/Other    Renal/GU      Musculoskeletal   Abdominal   Peds  Hematology  (+) anemia ,   Anesthesia Other Findings   Reproductive/Obstetrics                           Anesthesia Physical Anesthesia Plan  ASA: III  Anesthesia Plan: General   Post-op Pain Management:    Induction: Intravenous  Airway Management Planned: Oral ETT  Additional Equipment:   Intra-op Plan:   Post-operative Plan: Extubation in OR  Informed Consent: I have reviewed the patients History and Physical, chart, labs and discussed the procedure including the risks, benefits and alternatives for the proposed anesthesia with the patient or authorized representative who has indicated his/her understanding and acceptance.     Plan Discussed with:   Anesthesia Plan Comments:         Anesthesia Quick Evaluation

## 2013-09-20 NOTE — Progress Notes (Signed)
Patient arrived to room via stretcher, alert and oriented. C/o "real bad headache". Offered morphine but patient refused. She also c.o nausea. No tylenol ordered. MD paged, order received for one time tylenol 650mg  from Dr. Donne Hazel. Orders and safety precautions reviewed with patient. Call light within reach.  Bed alarm activated. Will continue to monitor.   Ave Filter, RN

## 2013-09-21 ENCOUNTER — Encounter (HOSPITAL_COMMUNITY): Payer: BC Managed Care – PPO | Admitting: Anesthesiology

## 2013-09-21 ENCOUNTER — Inpatient Hospital Stay (HOSPITAL_COMMUNITY): Payer: BC Managed Care – PPO | Admitting: Anesthesiology

## 2013-09-21 ENCOUNTER — Encounter (HOSPITAL_COMMUNITY): Payer: Self-pay | Admitting: Anesthesiology

## 2013-09-21 ENCOUNTER — Encounter (HOSPITAL_COMMUNITY): Admission: EM | Disposition: A | Discharge status: Home / Self Care | Payer: Self-pay | Source: Home / Self Care

## 2013-09-21 DIAGNOSIS — K3580 Unspecified acute appendicitis: Secondary | ICD-10-CM | POA: Diagnosis not present

## 2013-09-21 DIAGNOSIS — K389 Disease of appendix, unspecified: Secondary | ICD-10-CM

## 2013-09-21 HISTORY — PX: LAPAROSCOPIC APPENDECTOMY: SHX408

## 2013-09-21 LAB — CBC
HEMATOCRIT: 32.8 % — AB (ref 36.0–46.0)
HEMOGLOBIN: 10.9 g/dL — AB (ref 12.0–15.0)
MCH: 29.9 pg (ref 26.0–34.0)
MCHC: 33.2 g/dL (ref 30.0–36.0)
MCV: 89.9 fL (ref 78.0–100.0)
Platelets: 179 10*3/uL (ref 150–400)
RBC: 3.65 MIL/uL — AB (ref 3.87–5.11)
RDW: 13.9 % (ref 11.5–15.5)
WBC: 4.4 10*3/uL (ref 4.0–10.5)

## 2013-09-21 LAB — SURGICAL PCR SCREEN
MRSA, PCR: NEGATIVE
STAPHYLOCOCCUS AUREUS: NEGATIVE

## 2013-09-21 SURGERY — APPENDECTOMY, LAPAROSCOPIC
Anesthesia: General

## 2013-09-21 MED ORDER — LORATADINE 10 MG PO TABS
10.0000 mg | ORAL_TABLET | Freq: Every day | ORAL | Status: DC
  Administered 2013-09-21 – 2013-09-22 (×2): 10 mg via ORAL
  Filled 2013-09-21 (×2): qty 1

## 2013-09-21 MED ORDER — GLYCOPYRROLATE 0.2 MG/ML IJ SOLN
INTRAMUSCULAR | Status: AC
  Filled 2013-09-21: qty 2

## 2013-09-21 MED ORDER — BUPIVACAINE-EPINEPHRINE (PF) 0.25% -1:200000 IJ SOLN
INTRAMUSCULAR | Status: AC
  Filled 2013-09-21: qty 30

## 2013-09-21 MED ORDER — ONDANSETRON HCL 4 MG/2ML IJ SOLN: 4.0000 mg | Freq: Once | INTRAMUSCULAR | Status: DC | PRN

## 2013-09-21 MED ORDER — MIDAZOLAM HCL 2 MG/2ML IJ SOLN
INTRAMUSCULAR | Status: AC
  Filled 2013-09-21: qty 2

## 2013-09-21 MED ORDER — NEOSTIGMINE METHYLSULFATE 1 MG/ML IJ SOLN
INTRAMUSCULAR | Status: DC | PRN
  Administered 2013-09-21: 3 mg via INTRAVENOUS

## 2013-09-21 MED ORDER — HYDROMORPHONE HCL PF 1 MG/ML IJ SOLN: 0.2500 mg | INTRAMUSCULAR | Status: DC | PRN

## 2013-09-21 MED ORDER — HYDROMORPHONE HCL PF 1 MG/ML IJ SOLN
INTRAMUSCULAR | Status: AC
  Filled 2013-09-21: qty 1

## 2013-09-21 MED ORDER — SUCCINYLCHOLINE CHLORIDE 20 MG/ML IJ SOLN
INTRAMUSCULAR | Status: DC | PRN
  Administered 2013-09-21: 100 mg via INTRAVENOUS

## 2013-09-21 MED ORDER — LIDOCAINE HCL (CARDIAC) 20 MG/ML IV SOLN
INTRAVENOUS | Status: AC
  Filled 2013-09-21: qty 5

## 2013-09-21 MED ORDER — PROPOFOL 10 MG/ML IV BOLUS
INTRAVENOUS | Status: AC
  Filled 2013-09-21: qty 20

## 2013-09-21 MED ORDER — SUFENTANIL CITRATE 50 MCG/ML IV SOLN
INTRAVENOUS | Status: DC | PRN
  Administered 2013-09-21 (×2): 10 ug via INTRAVENOUS

## 2013-09-21 MED ORDER — SODIUM CHLORIDE 0.9 % IR SOLN
Status: DC | PRN
  Administered 2013-09-21: 1000 mL

## 2013-09-21 MED ORDER — OXYCODONE HCL 5 MG PO TABS: 5.0000 mg | ORAL_TABLET | Freq: Once | ORAL | Status: DC | PRN

## 2013-09-21 MED ORDER — GLYCOPYRROLATE 0.2 MG/ML IJ SOLN
INTRAMUSCULAR | Status: DC | PRN
  Administered 2013-09-21: 0.4 mg via INTRAVENOUS

## 2013-09-21 MED ORDER — FENTANYL CITRATE 0.05 MG/ML IJ SOLN
INTRAMUSCULAR | Status: AC
  Filled 2013-09-21: qty 5

## 2013-09-21 MED ORDER — BUPIVACAINE-EPINEPHRINE 0.25% -1:200000 IJ SOLN
INTRAMUSCULAR | Status: DC | PRN
  Administered 2013-09-21: 20 mL

## 2013-09-21 MED ORDER — DEXTROSE 5 % IV SOLN
1.0000 g | Freq: Once | INTRAVENOUS | Status: AC
  Administered 2013-09-21: 1 g via INTRAVENOUS
  Filled 2013-09-21 (×2): qty 1

## 2013-09-21 MED ORDER — LACTATED RINGERS IV SOLN
INTRAVENOUS | Status: DC | PRN
  Administered 2013-09-21 (×2): via INTRAVENOUS

## 2013-09-21 MED ORDER — METOCLOPRAMIDE HCL 5 MG/ML IJ SOLN: 10.0000 mg | Freq: Once | INTRAMUSCULAR | Status: DC | PRN

## 2013-09-21 MED ORDER — OXYCODONE-ACETAMINOPHEN 5-325 MG PO TABS
1.0000 | ORAL_TABLET | ORAL | Status: DC | PRN
  Administered 2013-09-21 – 2013-09-22 (×3): 1 via ORAL
  Filled 2013-09-21 (×3): qty 1

## 2013-09-21 MED ORDER — LIDOCAINE HCL (CARDIAC) 20 MG/ML IV SOLN
INTRAVENOUS | Status: DC | PRN
  Administered 2013-09-21: 100 mg via INTRAVENOUS

## 2013-09-21 MED ORDER — CEFAZOLIN SODIUM-DEXTROSE 2-3 GM-% IV SOLR
INTRAVENOUS | Status: AC
  Filled 2013-09-21: qty 50

## 2013-09-21 MED ORDER — PROPOFOL 10 MG/ML IV BOLUS
INTRAVENOUS | Status: DC | PRN
  Administered 2013-09-21: 170 mg via INTRAVENOUS

## 2013-09-21 MED ORDER — MIDAZOLAM HCL 5 MG/5ML IJ SOLN
INTRAMUSCULAR | Status: DC | PRN
  Administered 2013-09-21: 2 mg via INTRAVENOUS

## 2013-09-21 MED ORDER — NEOSTIGMINE METHYLSULFATE 1 MG/ML IJ SOLN
INTRAMUSCULAR | Status: AC
  Filled 2013-09-21: qty 10

## 2013-09-21 MED ORDER — ONDANSETRON HCL 4 MG/2ML IJ SOLN
INTRAMUSCULAR | Status: AC
  Filled 2013-09-21: qty 2

## 2013-09-21 MED ORDER — ROCURONIUM BROMIDE 50 MG/5ML IV SOLN
INTRAVENOUS | Status: AC
  Filled 2013-09-21: qty 1

## 2013-09-21 MED ORDER — SODIUM CHLORIDE 0.9 % IJ SOLN
INTRAMUSCULAR | Status: AC
  Filled 2013-09-21: qty 10

## 2013-09-21 MED ORDER — ONDANSETRON HCL 4 MG/2ML IJ SOLN
INTRAMUSCULAR | Status: DC | PRN
  Administered 2013-09-21: 4 mg via INTRAVENOUS

## 2013-09-21 MED ORDER — SUCCINYLCHOLINE CHLORIDE 20 MG/ML IJ SOLN
INTRAMUSCULAR | Status: AC
  Filled 2013-09-21: qty 1

## 2013-09-21 MED ORDER — ALBUTEROL SULFATE HFA 108 (90 BASE) MCG/ACT IN AERS: 2.0000 | INHALATION_SPRAY | Freq: Four times a day (QID) | RESPIRATORY_TRACT | Status: DC | PRN

## 2013-09-21 MED ORDER — DEXAMETHASONE SODIUM PHOSPHATE 4 MG/ML IJ SOLN
INTRAMUSCULAR | Status: AC
  Filled 2013-09-21: qty 1

## 2013-09-21 MED ORDER — ROCURONIUM BROMIDE 100 MG/10ML IV SOLN
INTRAVENOUS | Status: DC | PRN
  Administered 2013-09-21: 20 mg via INTRAVENOUS

## 2013-09-21 MED ORDER — HYDROMORPHONE HCL PF 1 MG/ML IJ SOLN
0.2500 mg | INTRAMUSCULAR | Status: DC | PRN
  Administered 2013-09-21 (×4): 0.5 mg via INTRAVENOUS

## 2013-09-21 MED ORDER — SUFENTANIL CITRATE 50 MCG/ML IV SOLN
INTRAVENOUS | Status: AC
  Filled 2013-09-21: qty 1

## 2013-09-21 MED ORDER — DEXAMETHASONE SODIUM PHOSPHATE 4 MG/ML IJ SOLN
INTRAMUSCULAR | Status: DC | PRN
  Administered 2013-09-21: 4 mg via INTRAVENOUS

## 2013-09-21 MED ORDER — CLINDAMYCIN PHOSPHATE 600 MG/50ML IV SOLN
600.0000 mg | Freq: Once | INTRAVENOUS | Status: AC
  Administered 2013-09-21: 600 mg via INTRAVENOUS
  Filled 2013-09-21: qty 50

## 2013-09-21 MED ORDER — ALBUTEROL SULFATE (2.5 MG/3ML) 0.083% IN NEBU: 2.5000 mg | INHALATION_SOLUTION | Freq: Four times a day (QID) | RESPIRATORY_TRACT | Status: DC | PRN

## 2013-09-21 MED ORDER — OXYCODONE HCL 5 MG/5ML PO SOLN: 5.0000 mg | Freq: Once | ORAL | Status: DC | PRN

## 2013-09-21 SURGICAL SUPPLY — 34 items
APPLIER CLIP ROT 10 11.4 M/L (STAPLE)
CANISTER SUCTION 2500CC (MISCELLANEOUS) ×2 IMPLANT
CHLORAPREP W/TINT 26ML (MISCELLANEOUS) ×2 IMPLANT
CLIP APPLIE ROT 10 11.4 M/L (STAPLE) IMPLANT
COVER SURGICAL LIGHT HANDLE (MISCELLANEOUS) ×2 IMPLANT
CUTTER FLEX LINEAR 45M (STAPLE) ×2 IMPLANT
DERMABOND ADVANCED (GAUZE/BANDAGES/DRESSINGS) ×1
DERMABOND ADVANCED .7 DNX12 (GAUZE/BANDAGES/DRESSINGS) ×1 IMPLANT
ELECT REM PT RETURN 9FT ADLT (ELECTROSURGICAL) ×2
ELECTRODE REM PT RTRN 9FT ADLT (ELECTROSURGICAL) ×1 IMPLANT
GLOVE BIO SURGEON STRL SZ7 (GLOVE) ×2 IMPLANT
GLOVE BIOGEL PI IND STRL 7.5 (GLOVE) ×1 IMPLANT
GLOVE BIOGEL PI INDICATOR 7.5 (GLOVE) ×1
GOWN STRL NON-REIN LRG LVL3 (GOWN DISPOSABLE) ×4 IMPLANT
KIT BASIN OR (CUSTOM PROCEDURE TRAY) ×2 IMPLANT
KIT ROOM TURNOVER OR (KITS) ×2 IMPLANT
NS IRRIG 1000ML POUR BTL (IV SOLUTION) ×2 IMPLANT
PAD ARMBOARD 7.5X6 YLW CONV (MISCELLANEOUS) ×4 IMPLANT
POUCH RETRIEVAL ECOSAC 10 (ENDOMECHANICALS) ×1 IMPLANT
POUCH RETRIEVAL ECOSAC 10MM (ENDOMECHANICALS) ×1
RELOAD 45 VASCULAR/THIN (ENDOMECHANICALS) IMPLANT
RELOAD STAPLE TA45 3.5 REG BLU (ENDOMECHANICALS) ×2 IMPLANT
SCALPEL HARMONIC ACE (MISCELLANEOUS) ×2 IMPLANT
SCISSORS LAP 5X35 DISP (ENDOMECHANICALS) IMPLANT
SET IRRIG TUBING LAPAROSCOPIC (IRRIGATION / IRRIGATOR) ×2 IMPLANT
SLEEVE ENDOPATH XCEL 5M (ENDOMECHANICALS) ×2 IMPLANT
SPECIMEN JAR SMALL (MISCELLANEOUS) ×2 IMPLANT
SUT MNCRL AB 4-0 PS2 18 (SUTURE) ×2 IMPLANT
TOWEL OR 17X24 6PK STRL BLUE (TOWEL DISPOSABLE) ×2 IMPLANT
TOWEL OR 17X26 10 PK STRL BLUE (TOWEL DISPOSABLE) ×2 IMPLANT
TRAY FOLEY CATH 16FR SILVER (SET/KITS/TRAYS/PACK) ×2 IMPLANT
TRAY LAPAROSCOPIC (CUSTOM PROCEDURE TRAY) ×2 IMPLANT
TROCAR XCEL BLUNT TIP 100MML (ENDOMECHANICALS) ×2 IMPLANT
TROCAR XCEL NON-BLD 5MMX100MML (ENDOMECHANICALS) ×2 IMPLANT

## 2013-09-21 NOTE — Transfer of Care (Signed)
Immediate Anesthesia Transfer of Care Note  Patient: Leah [name]  Procedure(s) Performed: Procedure(s): APPENDECTOMY LAPAROSCOPIC (N/A)  Patient Location: PACU  Anesthesia Type:General  Level of Consciousness: oriented, sedated, patient cooperative and responds to stimulation  Airway & Oxygen Therapy: Patient Spontanous Breathing and Patient connected to nasal cannula oxygen  Post-op Assessment: Report given to PACU RN, Post -op Vital signs reviewed and stable and Patient moving all extremities X 4  Post vital signs: Reviewed and stable  Complications: No apparent anesthesia complications

## 2013-09-21 NOTE — Anesthesia Postprocedure Evaluation (Signed)
Anesthesia Post Note  Patient: Leah [name]  Procedure(s) Performed: Procedure(s) (LRB): APPENDECTOMY LAPAROSCOPIC (N/A)  Anesthesia type: General  Patient location: PACU  Post pain: Pain level controlled  Post assessment: Patient's Cardiovascular Status Stable  Last Vitals:  Filed Vitals:   09/21/13 1713  BP:   Pulse: 62  Temp:   Resp: 20    Post vital signs: Reviewed and stable  Level of consciousness: alert  Complications: No apparent anesthesia complications

## 2013-09-21 NOTE — Anesthesia Preprocedure Evaluation (Signed)
Anesthesia Evaluation  Patient identified by MRN, date of birth, ID band Patient awake    Reviewed: Allergy & Precautions, H&P , NPO status , Patient's Chart, lab work & pertinent test results, reviewed documented beta blocker date and time   Airway Mallampati: II TM Distance: >3 FB Neck ROM: full    Dental   Pulmonary COPDCurrent Smoker,  breath sounds clear to auscultation        Cardiovascular hypertension, On Medications Rhythm:regular     Neuro/Psych PSYCHIATRIC DISORDERS  Neuromuscular disease    GI/Hepatic Neg liver ROS, hiatal hernia, GERD-  Medicated and Controlled,  Endo/Other  negative endocrine ROS  Renal/GU negative Renal ROS  negative genitourinary   Musculoskeletal   Abdominal   Peds  Hematology negative hematology ROS (+) anemia ,   Anesthesia Other Findings See surgeon's H&P   Reproductive/Obstetrics negative OB ROS                           Anesthesia Physical Anesthesia Plan  ASA: II and emergent  Anesthesia Plan: General   Post-op Pain Management:    Induction: Intravenous, Rapid sequence and Cricoid pressure planned  Airway Management Planned: Oral ETT  Additional Equipment:   Intra-op Plan:   Post-operative Plan: Extubation in OR  Informed Consent: I have reviewed the patients History and Physical, chart, labs and discussed the procedure including the risks, benefits and alternatives for the proposed anesthesia with the patient or authorized representative who has indicated his/her understanding and acceptance.   Dental Advisory Given  Plan Discussed with: CRNA and Surgeon  Anesthesia Plan Comments:         Anesthesia Quick Evaluation

## 2013-09-21 NOTE — Progress Notes (Signed)
1 Day Post-Op  Subjective: Still hurts, hurt more eating  Objective: Vital signs in last 24 hours: Temp:  [97.8 F (36.6 C)-98.7 F (37.1 C)] 98 F (36.7 C) (02/22 0449) Pulse Rate:  [64-73] 64 (02/22 0449) Resp:  [16-20] 16 (02/22 0449) BP: (100-137)/(43-65) 137/62 mmHg (02/22 0456) SpO2:  [90 %-98 %] 98 % (02/22 0449) Last BM Date: 09/20/13  Intake/Output from previous day:   Intake/Output this shift:    GI: soft tender rlq worse today  Lab Results:   Recent Labs  09/19/13 1610 09/21/13 0029  WBC 5.6 4.4  HGB 14.0 10.9*  HCT 42.2 32.8*  PLT 237 179   BMET  Recent Labs  09/19/13 1610  NA 139  K 4.1  CL 99  CO2 27  GLUCOSE 95  BUN 11  CREATININE 0.70  CALCIUM 9.4   PT/INR No results found for this basename: LABPROT, INR,  in the last 72 hours ABG No results found for this basename: PHART, PCO2, PO2, HCO3,  in the last 72 hours  Studies/Results: Ct Abdomen Pelvis W Contrast  09/19/2013   CLINICAL DATA:  Right lower quadrant pain and tenderness. Clinical suspicion for appendicitis.  EXAM: CT ABDOMEN AND PELVIS WITH CONTRAST  TECHNIQUE: Multidetector CT imaging of the abdomen and pelvis was performed using the standard protocol following bolus administration of intravenous contrast.  CONTRAST:  126mL OMNIPAQUE IOHEXOL 300 MG/ML  SOLN  COMPARISON:  07/05/2010  FINDINGS: Surgical clips from prior cholecystectomy noted. No evidence of biliary ductal dilatation. Small hepatic cysts again noted but no liver masses are identified.  The pancreas, spleen, adrenal glands, and kidneys are normal in appearance. No soft tissue masses or lymphadenopathy identified within the abdomen or pelvis. Prior hysterectomy noted. Adnexal regions are unremarkable in appearance.  Mild diffuse wall thickening is seen involving the appendix, consistent with acute appendicitis. No evidence of abscess. No evidence of bowel obstruction. No other inflammatory process identified.  IMPRESSION:  Findings consistent with early acute appendicitis. No evidence of abscess or other complication.   Electronically Signed   By: Earle Gell M.D.   On: 09/19/2013 20:53    Anti-infectives: Anti-infectives   Start     Dose/Rate Route Frequency Ordered Stop   09/20/13 0100  clindamycin (CLEOCIN) IVPB 600 mg     600 mg 100 mL/hr over 30 Minutes Intravenous 3 times per day 09/19/13 2351     09/20/13 0000  aztreonam (AZACTAM) 1 g in dextrose 5 % 50 mL IVPB     1 g 100 mL/hr over 30 Minutes Intravenous 3 times per day 09/19/13 2357        Assessment/Plan: Likely appendicitis  Will plan for dx laparoscopy today with appendectomy. Risks discussed especially that of open surgery  [facility] 09/21/2013

## 2013-09-21 NOTE — Op Note (Signed)
Preoperative diagnosis: Likely appendicitis Postoperative diagnosis: Same as above Procedure: Laparoscopic appendectomy Surgeon: Dr. Serita Grammes Anesthesia: Gen. Estimated blood loss: Minimal Specimens: Appendix to pathology Complications: None Drains: None Sponge and needle count correct at completion Disposition to recovery in stable condition  Indications: This is a 68 yo female admitted on Friday with possible appendicitis. I was not entirely sure she had appendicitis due to some features of her CT scan but she persisted with right lower quadrant pain. Her white blood cell count remains normal. I discussed with her going to the operating room today for diagnostic laparoscopy and appendectomy.  Procedure: After informed consent was obtained the patient was taken to the operating room. She was maintained on antibiotics regimen already. Sequential compression devices were on her legs. She was  placed under general anesthesia without complication. Her abdomen was prepped and draped in the standard sterile surgical fashion. A surgical timeout was then performed.  Due to her prior surgery I made a incision after infiltrating Marcaine above her umbilicus. I grasped her fascia. I incised this sharply. I entered the peritoneum bluntly. There was no evidence of an entry injury. I then inserted a Hassan trocar placed a 0 Vicryl pursestring suture through the fascia. I then inserted a 5 mm trocar in the right upper quadrant and one in the right lower quadrant. These were both done under direct vision. There was a fair amount of scar tissue around her low midline and into her pelvis which I did not disturb throughout the operation. I then was able to identify the appendix. It did look inflamed but certainly was not perforated at all. I first was able to identify the base clearly. I then encircled this. I divided this with a stapler. The small bowel was adherent to the appendiceal mesentery. I used the  harmonic scalpel and divided the appendix very close to the appendix without injuring the small bowel. I then placed this in a bag and removed it. I viewed this area several times and there was no injury to either the small bowel or the cecum. This was hemostatic upon completion. I then removed my trocars and tied my pursestring down. This completely obliterated the umbilical defect. I then closed using 4-0 Monocryl and Dermabond. She tolerated this well was extubated and transferred to recovery stable.

## 2013-09-22 ENCOUNTER — Encounter (HOSPITAL_COMMUNITY): Payer: Self-pay | Admitting: General Surgery

## 2013-09-22 MED ORDER — OXYCODONE-ACETAMINOPHEN 5-325 MG PO TABS: 1.0000 | ORAL_TABLET | ORAL | Status: DC | PRN

## 2013-09-22 NOTE — Discharge Instructions (Signed)
[facility] ______[facility] °LAPAROSCOPIC SURGERY: POST OP INSTRUCTIONS °Always review your discharge instruction sheet given to you by the facility where your surgery was performed. °IF YOU HAVE DISABILITY OR FAMILY LEAVE FORMS, YOU MUST BRING THEM TO THE OFFICE FOR PROCESSING.   °DO NOT GIVE THEM TO YOUR DOCTOR. ° °1. A prescription for pain medication may be given to you upon discharge.  Take your pain medication as prescribed, if needed.  If narcotic pain medicine is not needed, then you may take acetaminophen (Tylenol) or ibuprofen (Advil) as needed. °2. Take your usually prescribed medications unless otherwise directed. °3. If you need a refill on your pain medication, please contact your pharmacy.  They will contact our office to request authorization. Prescriptions will not be filled after 5pm or on week-ends. °4. You should follow a light diet the first few days after arrival home, such as soup and crackers, etc.  Be sure to include lots of fluids daily. °5. Most patients will experience some swelling and bruising in the area of the incisions.  Ice packs will help.  Swelling and bruising can take several days to resolve.  °6. It is common to experience some constipation if taking pain medication after surgery.  Increasing fluid intake and taking a stool softener (such as Colace) will usually help or prevent this problem from occurring.  A mild laxative (Milk of Magnesia or Miralax) should be taken according to package instructions if there are no bowel movements after 48 hours. °7. Unless discharge instructions indicate otherwise, you may remove your bandages 24-48 hours after surgery, and you may shower at that time.  You may have steri-strips (small skin tapes) in place directly over the incision.  These strips should be left on the skin for 7-10 days.  If your surgeon used skin glue on the incision, you may shower in 24 hours.  The glue will flake off over the next 2-3 weeks.  Any sutures or  staples will be removed at the office during your follow-up visit. °8. ACTIVITIES:  You may resume regular (light) daily activities beginning the next day--such as daily self-care, walking, climbing stairs--gradually increasing activities as tolerated.  You may have sexual intercourse when it is comfortable.  Refrain from any heavy lifting or straining until approved by your doctor. °a. You may drive when you are no longer taking prescription pain medication, you can comfortably wear a seatbelt, and you can safely maneuver your car and apply brakes. °b. RETURN TO WORK:  __________________________________________________________ °9. You should see your doctor in the office for a follow-up appointment approximately 2-3 weeks after your surgery.  Make sure that you call for this appointment within a day or two after you arrive home to insure a convenient appointment time. °10. OTHER INSTRUCTIONS: __________________________________________________________________________________________________________________________ __________________________________________________________________________________________________________________________ °WHEN TO CALL YOUR DOCTOR: °1. Fever over 101.0 °2. Inability to urinate °3. Continued bleeding from incision. °4. Increased pain, redness, or drainage from the incision. °5. Increasing abdominal pain ° °The clinic staff is available to answer your questions during regular business hours.  Please don’t hesitate to call and ask to speak to one of the nurses for clinical concerns.  If you have a medical emergency, go to the nearest emergency room or call 911.  A surgeon from [facility] is always on call at the hospital. °[address], [address], [place], [place]  [address] ? [address] [address], [place], [place]   [address] °[phone] ? [phone] ? FAX [phone] °Web site:   [email or web address] °

## 2013-09-22 NOTE — Discharge Summary (Signed)
Patient ID: LEGEND PECORE MRN: [id number] DOB/AGE: 66-25-1947 66 y.o.  Admit date: 09/19/2013 Discharge date: 09/22/2013  Procedures: lap appy  Consults: None  Reason for Admission: 65 yof with multiple medical problems including copd, active smoker and history of sigmoidectomy for diverticular disease who presents with rlq pain since Tuesday. Hasnt really changed over that time and came today just because was still present. Had some nausea and emesis with contrast she drank today. Otherwise ate fine yesterday and didn't today just because she has been in urgent care and er. She was hungry. She has been having bms and passing flatus her normal way. She has colonoscopy in recent past with Dr Olevia Perches that had some polyps but otherwise ok.She underwent a ct scan that shows early appendicitis.  Admission Diagnoses:  1. Acute appendicitis  Hospital Course: the patient was admitted and taken to the operating room where she underwent a lap appy.  She tolerated the procedure well.  The following day she was tolerating a diet and her pain was well controlled.  She was felt to be stable for dc home.  PE: Abd: soft, appropriately tender, +BS, ND, incisions c/d/i  Discharge Diagnoses:  Active Problems:   Appendicitis s/p lap appy  Discharge Medications:   Medication List         albuterol 108 (90 BASE) MCG/ACT inhaler  Commonly known as:  PROAIR HFA  Inhale 2 puffs into the lungs every 6 (six) hours as needed for wheezing.     cetirizine 10 MG tablet  Commonly known as:  ZYRTEC  Take 10 mg by mouth daily.     dicyclomine 10 MG capsule  Commonly known as:  BENTYL  Take 1 capsule by mouth three times daily as needed for cramping and spasms.     esomeprazole 40 MG capsule  Commonly known as:  NEXIUM  Take 40 mg by mouth 2 (two) times daily before a meal.     estradiol 0.1 MG/24HR patch  Commonly known as:  VIVELLE-DOT  Place 1 patch onto the skin 2 (two) times a week.     LORazepam 0.5 MG tablet  Commonly known as:  ATIVAN  - Take 0.5 mg by mouth at bedtime. At bedtime  -      oxyCODONE-acetaminophen 5-325 MG per tablet  Commonly known as:  PERCOCET/ROXICET  Take 1-2 tablets by mouth every 4 (four) hours as needed for moderate pain.     sertraline 50 MG tablet  Commonly known as:  ZOLOFT  Take 50 mg by mouth at bedtime.     simvastatin 20 MG tablet  Commonly known as:  ZOCOR  Take 20 mg by mouth daily at 6 PM.     TYLENOL 500 MG tablet  Generic drug:  acetaminophen  Take 500 mg by mouth every 6 (six) hours as needed.        Discharge Instructions:     Follow-up Information   Follow up with [facility] On 10/14/2013. (2:30pm, arrive by 2:00pm for paperwork)    Contact information:   [address] [place] [address] [phone]       Signed: Henreitta Cea 09/22/2013, 11:11 AM

## 2013-09-22 NOTE — Progress Notes (Signed)
Discharge orders received, pt for discharge home today,  IV D/C.  D/C instructions and Rx given with verbalized understanding.  Family at bedside to assist pt with discharge. Staff brought pt downstairs via wheelchair.  

## 2013-09-22 NOTE — Discharge Summary (Signed)
Leah [name]. [name], MD, [facility]  General/ Trauma Surgery  09/22/2013 1:22 PM

## 2013-09-23 ENCOUNTER — Encounter: Payer: Self-pay | Admitting: *Deleted

## 2013-09-25 ENCOUNTER — Ambulatory Visit: Payer: BC Managed Care – PPO | Admitting: Nurse Practitioner

## 2013-10-02 ENCOUNTER — Ambulatory Visit: Payer: BC Managed Care – PPO | Admitting: Physician Assistant

## 2013-10-14 ENCOUNTER — Ambulatory Visit (INDEPENDENT_AMBULATORY_CARE_PROVIDER_SITE_OTHER): Payer: BC Managed Care – PPO | Admitting: General Surgery

## 2013-10-14 ENCOUNTER — Encounter (INDEPENDENT_AMBULATORY_CARE_PROVIDER_SITE_OTHER): Payer: Self-pay

## 2013-10-14 VITALS — BP 120/78 | HR 64 | Temp 98.0°F | Resp 18 | Ht 63.0 in | Wt 119.0 lb

## 2013-10-14 DIAGNOSIS — K352 Acute appendicitis with generalized peritonitis, without abscess: Secondary | ICD-10-CM

## 2013-10-14 NOTE — Patient Instructions (Signed)
You can go back to work on 10/20/13.  Call if you have any further issues.

## 2013-10-14 NOTE — Progress Notes (Signed)
[place] 1945/08/15 [id number] 10/14/2013   History of Present Illness: Leah [name] is a  68 y.o. female who presents today status post lap appy by Dr. Rolm Bookbinder.  Pathology reveals:  Appendix, Other than Incidental - FIBROUS OBLITERATION OF APPENDIX. [place] RUND.  The patient is tolerating a regular diet, having normal bowel movements, has good pain control.  She  is back to most normal activities.   Physical Exam: BP 120/78  Pulse 64  Temp(Src) 98 F (36.7 C)  Resp 18  Ht 5\' 3"  (1.6 m)  Wt 53.978 kg (119 lb)  BMI 21.09 kg/m2 Abd: soft, nontender, active bowel sounds, nondistended.  All incisions are well healed. She is stillvery sore and want to hold of on going to work until 10/20/13. Impression: 1.  Acute appendicitis, s/p lap appy   Patient Active Problem List   Diagnosis Date Noted  . Appendicitis 09/19/2013  . Abdominal pain, left lower quadrant 03/05/2013  . HBP (high blood pressure) 02/07/2013  . Sinusitis, acute 01/15/2013  . COPD GOLD II 01/15/2013  . Smoker 12/20/2012  . Epigastric abdominal pain 09/03/2012  . Left lower quadrant pain 01/24/2011  . KNEE PAIN 06/07/2009  . ROSACEA 04/16/2009  . B12 DEFICIENCY 04/09/2009  . ANEMIA, IRON DEFICIENCY 03/19/2009  . MALAISE AND FATIGUE 03/19/2009  . NECK PAIN, LEFT 01/11/2009  . URI 07/30/2008  . CONSTIPATION 05/26/2008  . NAUSEA AND VOMITING 05/26/2008  . MUSCLE PAIN 04/27/2008  . VOMITING 04/17/2008  . DYSPHAGIA UNSPECIFIED 04/17/2008  . ANXIETY DEPRESSION 03/12/2008  . TREMOR 03/12/2008  . HYPERLIPIDEMIA 11/13/2007  . DEPRESSION 11/13/2007  . ESOPHAGEAL SPASM 11/13/2007  . GERD 11/13/2007  . IBS 11/13/2007  . CARPAL TUNNEL SYNDROME, HX OF 11/13/2007  . Diverticulosis of Colon (without Mention of Hemorrhage) 03/14/2007  . GASTRITIS, CHRONIC 04/10/2002  . HIATAL HERNIA 04/10/2002   Plan: She  is able to return to normal activities. She  may follow up on a prn  basis.

## 2013-10-27 ENCOUNTER — Encounter: Payer: Self-pay | Admitting: Physician Assistant

## 2013-10-27 ENCOUNTER — Ambulatory Visit (INDEPENDENT_AMBULATORY_CARE_PROVIDER_SITE_OTHER): Payer: BC Managed Care – PPO | Admitting: Physician Assistant

## 2013-10-27 VITALS — BP 123/73 | HR 101 | Temp 98.7°F | Resp 16 | Ht 61.0 in | Wt 119.4 lb

## 2013-10-27 DIAGNOSIS — J111 Influenza due to unidentified influenza virus with other respiratory manifestations: Secondary | ICD-10-CM | POA: Insufficient documentation

## 2013-10-27 MED ORDER — OSELTAMIVIR PHOSPHATE 75 MG PO CAPS: 75.0000 mg | ORAL_CAPSULE | Freq: Two times a day (BID) | ORAL | Status: DC

## 2013-10-27 MED ORDER — BENZONATATE 100 MG PO CAPS: 100.0000 mg | ORAL_CAPSULE | Freq: Two times a day (BID) | ORAL | Status: DC | PRN

## 2013-10-27 NOTE — Progress Notes (Signed)
Patient presents to clinic today c/o 1 day of nonproductive cough, chest tightness and post-nasal drip.  Endorses some chest tenderness secondary to persistent cough.  Denies pleuritic chest pain.  Endorses some mild sinus pressure but denies sinus pain, ear pain or tooth pain.  Denies SOB or wheezing.  Denies recent travel.  Endorses 2 co-workers had the flu a couple of weeks ago.  Patient endorses having bother her flu and pneumonia vaccinations this past winter.  Past Medical History  Diagnosis Date  . Esophageal spasm   . GERD (gastroesophageal reflux disease)   . Chronic gastritis   . Diverticulosis   . B12 deficiency   . IBS (irritable bowel syndrome)   . Anxiety   . Depression   . Hiatal hernia   . Iron deficiency anemia   . Hyperlipidemia   . Diverticulitis   . Barrett esophagus   . Rosacea   . Interstitial cystitis     Current Outpatient Prescriptions on File Prior to Visit  Medication Sig Dispense Refill  . acetaminophen (TYLENOL) 500 MG tablet Take 500 mg by mouth every 6 (six) hours as needed.        Marland Kitchen albuterol (PROAIR HFA) 108 (90 BASE) MCG/ACT inhaler Inhale 2 puffs into the lungs every 6 (six) hours as needed for wheezing.  1 Inhaler  0  . cetirizine (ZYRTEC) 10 MG tablet Take 10 mg by mouth daily.      Marland Kitchen dicyclomine (BENTYL) 10 MG capsule Take 1 capsule by mouth three times daily as needed for cramping and spasms.  30 capsule  0  . esomeprazole (NEXIUM) 40 MG capsule Take 40 mg by mouth 2 (two) times daily before a meal.      . estradiol (VIVELLE-DOT) 0.1 MG/24HR Place 1 patch onto the skin 2 (two) times a week.        Marland Kitchen LORazepam (ATIVAN) 0.5 MG tablet Take 0.5 mg by mouth at bedtime. At bedtime       . oxyCODONE-acetaminophen (PERCOCET/ROXICET) 5-325 MG per tablet Take 1-2 tablets by mouth every 4 (four) hours as needed for moderate pain.  40 tablet  0  . sertraline (ZOLOFT) 50 MG tablet Take 50 mg by mouth at bedtime.      . simvastatin (ZOCOR) 20 MG tablet Take  20 mg by mouth daily at 6 PM.       No current facility-administered medications on file prior to visit.    Allergies  Allergen Reactions  . Amoxicillin-Pot Clavulanate Shortness Of Breath, Itching and Swelling  . Ciprofloxacin Shortness Of Breath, Itching and Swelling  . Moxifloxacin Shortness Of Breath, Itching and Swelling  . Penicillins Shortness Of Breath, Itching and Swelling  . Prednisone Other (See Comments)    GI irritation  . Sulfonamide Derivatives Shortness Of Breath, Itching and Swelling    Family History  Problem Relation Age of Onset  . Colon cancer Neg Hx   . Pancreatic cancer Mother   . Pancreatic cancer Father   . Colon polyps Mother   . Diabetes Father   . Heart disease Father   . Leukemia Father   . Prostate cancer Father   . Hypertension Father     History   Social History  . Marital Status: Married    Spouse Name: N/A    Number of Children: N/A  . Years of Education: N/A   Social History Main Topics  . Smoking status: Current Every Day Smoker -- 0.50 packs/day for 45 years    Types:  Cigarettes  . Smokeless tobacco: Never Used  . Alcohol Use: No  . Drug Use: No  . Sexual Activity: None   Other Topics Concern  . None   Social History Narrative  . None   Review of Systems - See HPI.  All other ROS are negative.  BP 123/73  Pulse 101  Temp(Src) 98.7 F (37.1 C) (Oral)  Resp 16  Ht _0  (1.549 m)  Wt 119 lb 6 oz (54.148 kg)  BMI 22.57 kg/m2  SpO2 96%  Physical Exam  Vitals reviewed. Constitutional: She is oriented to person, place, and time and well-developed, well-nourished, and in no distress.  HENT:  Head: Normocephalic and atraumatic.  Right Ear: External ear normal.  Left Ear: External ear normal.  Nose: Nose normal.  Mouth/Throat: Oropharynx is clear and moist. No oropharyngeal exudate.  TM within normal limits bilaterally.  No TTP of sinuses noted on exam.  Eyes: Conjunctivae are normal. Pupils are equal, round, and  reactive to light.  Neck: Neck supple.  Cardiovascular: Normal rate, regular rhythm, normal heart sounds and intact distal pulses.   Pulmonary/Chest: Effort normal and breath sounds normal. No respiratory distress. She has no wheezes. She has no rales. She exhibits no tenderness.  Lymphadenopathy:    She has no cervical adenopathy.  Neurological: She is alert and oriented to person, place, and time.  Skin: Skin is warm and dry. No rash noted.  Psychiatric: Affect normal.   Recent Results (from the past 2160 hour(s))  CBC WITH DIFFERENTIAL     Status: None   Collection Time    09/19/13  4:10 PM      Result Value Ref Range   WBC 5.6  4.0 - 10.5 K/uL   RBC 4.70  3.87 - 5.11 MIL/uL   Hemoglobin 14.0  12.0 - 15.0 g/dL   HCT 42.2  36.0 - 46.0 %   MCV 89.8  78.0 - 100.0 fL   MCH 29.8  26.0 - 34.0 pg   MCHC 33.2  30.0 - 36.0 g/dL   RDW 13.9  11.5 - 15.5 %   Platelets 237  150 - 400 K/uL   Neutrophils Relative % 61  43 - 77 %   Neutro Abs 3.4  1.7 - 7.7 K/uL   Lymphocytes Relative 29  12 - 46 %   Lymphs Abs 1.6  0.7 - 4.0 K/uL   Monocytes Relative 5  3 - 12 %   Monocytes Absolute 0.3  0.1 - 1.0 K/uL   Eosinophils Relative 5  0 - 5 %   Eosinophils Absolute 0.3  0.0 - 0.7 K/uL   Basophils Relative 1  0 - 1 %   Basophils Absolute 0.1  0.0 - 0.1 K/uL  COMPREHENSIVE METABOLIC PANEL     Status: Abnormal   Collection Time    09/19/13  4:10 PM      Result Value Ref Range   Sodium 139  137 - 147 mEq/L   Potassium 4.1  3.7 - 5.3 mEq/L   Chloride 99  96 - 112 mEq/L   CO2 27  19 - 32 mEq/L   Glucose, Bld 95  70 - 99 mg/dL   BUN 11  6 - 23 mg/dL   Creatinine, Ser 0.70  0.50 - 1.10 mg/dL   Calcium 9.4  8.4 - 10.5 mg/dL   Total Protein 8.0  6.0 - 8.3 g/dL   Albumin 3.8  3.5 - 5.2 g/dL   AST 12  0 -  37 U/L   ALT 8  0 - 35 U/L   Alkaline Phosphatase 79  39 - 117 U/L   Total Bilirubin 0.3  0.3 - 1.2 mg/dL   GFR calc non Af Amer 89 (*) >90 mL/min   GFR calc Af Amer >90  >90 mL/min    Comment: (NOTE)     The eGFR has been calculated using the CKD EPI equation.     This calculation has not been validated in all clinical situations.     eGFR's persistently <90 mL/min signify possible Chronic Kidney     Disease.  URINALYSIS, ROUTINE W REFLEX MICROSCOPIC     Status: Abnormal   Collection Time    09/19/13  4:27 PM      Result Value Ref Range   Color, Urine YELLOW  YELLOW   APPearance HAZY (*) CLEAR   Specific Gravity, Urine 1.024  1.005 - 1.030   pH 5.0  5.0 - 8.0   Glucose, UA NEGATIVE  NEGATIVE mg/dL   Hgb urine dipstick NEGATIVE  NEGATIVE   Bilirubin Urine NEGATIVE  NEGATIVE   Ketones, ur NEGATIVE  NEGATIVE mg/dL   Protein, ur NEGATIVE  NEGATIVE mg/dL   Urobilinogen, UA 0.2  0.0 - 1.0 mg/dL   Nitrite NEGATIVE  NEGATIVE   Leukocytes, UA NEGATIVE  NEGATIVE   Comment: MICROSCOPIC NOT DONE ON URINES WITH NEGATIVE PROTEIN, BLOOD, LEUKOCYTES, NITRITE, OR GLUCOSE <1000 mg/dL.  CBC     Status: Abnormal   Collection Time    09/21/13 12:29 AM      Result Value Ref Range   WBC 4.4  4.0 - 10.5 K/uL   RBC 3.65 (*) 3.87 - 5.11 MIL/uL   Hemoglobin 10.9 (*) 12.0 - 15.0 g/dL   Comment: DELTA CHECK NOTED     REPEATED TO VERIFY   HCT 32.8 (*) 36.0 - 46.0 %   MCV 89.9  78.0 - 100.0 fL   MCH 29.9  26.0 - 34.0 pg   MCHC 33.2  30.0 - 36.0 g/dL   RDW 13.9  11.5 - 15.5 %   Platelets 179  150 - 400 K/uL   Comment: DELTA CHECK NOTED     REPEATED TO VERIFY  SURGICAL PCR SCREEN     Status: None   Collection Time    09/21/13  1:34 PM      Result Value Ref Range   MRSA, PCR NEGATIVE  NEGATIVE   Staphylococcus aureus NEGATIVE  NEGATIVE   Comment:            The Xpert SA Assay (FDA     approved for NASAL specimens     in patients over 44 years of age),     is one component of     a comprehensive surveillance     program.  Test performance has     been validated by [employer] for patients greater     than or equal to 25 year old.     It is not intended     to diagnose  infection nor to     guide or monitor treatment.   Assessment/Plan: Influenza Flu swab positive.  Rx Tamiflu.  Tx tessalon perles for cough.  Increase fluids.  Plain Mucinex.  Rest.  Saline nasal spray.  OTC pain meds for aches.  Humidifier in bedroom.  Call or return to clinic if symptoms are not improving.

## 2013-10-27 NOTE — Assessment & Plan Note (Signed)
Flu swab positive.  Rx Tamiflu.  Tx tessalon perles for cough.  Increase fluids.  Plain Mucinex.  Rest.  Saline nasal spray.  OTC pain meds for aches.  Humidifier in bedroom.  Call or return to clinic if symptoms are not improving.

## 2013-10-27 NOTE — Patient Instructions (Signed)
You have been diagnosed with the flu.  Please take Tamiflu as directed. Increase your fluid intake.  Rest.  Use Saline nasal spray.  Use plain Mucinex for chest congestion.  Place a humidifier in the bedroom.  Use Tessalon as directed for cough.  Please take a multivitamin or probiotic daily as directed.  Please call or return to clinic if symptoms are not improving.   Influenza, Adult Influenza ("the flu") is a viral infection of the respiratory tract. It occurs more often in winter months because people spend more time in close contact with one another. Influenza can make you feel very sick. Influenza easily spreads from person to person (contagious). CAUSES  Influenza is caused by a virus that infects the respiratory tract. You can catch the virus by breathing in droplets from an infected person's cough or sneeze. You can also catch the virus by touching something that was recently contaminated with the virus and then touching your mouth, nose, or eyes. SYMPTOMS  Symptoms typically last 4 to 10 days and may include:  Fever.  Chills.  Headache, body aches, and muscle aches.  Sore throat.  Chest discomfort and cough.  Poor appetite.  Weakness or feeling tired.  Dizziness.  Nausea or vomiting. DIAGNOSIS  Diagnosis of influenza is often made based on your history and a physical exam. A nose or throat swab test can be done to confirm the diagnosis. RISKS AND COMPLICATIONS You may be at risk for a more severe case of influenza if you smoke cigarettes, have diabetes, have chronic heart disease (such as heart failure) or lung disease (such as asthma), or if you have a weakened immune system. Elderly people and pregnant women are also at risk for more serious infections. The most common complication of influenza is a lung infection (pneumonia). Sometimes, this complication can require emergency medical care and may be life-threatening. PREVENTION  An annual influenza vaccination (flu shot)  is the best way to avoid getting influenza. An annual flu shot is now routinely recommended for all adults in the [place] TREATMENT  In mild cases, influenza goes away on its own. Treatment is directed at relieving symptoms. For more severe cases, your caregiver may prescribe antiviral medicines to shorten the sickness. Antibiotic medicines are not effective, because the infection is caused by a virus, not by bacteria. HOME CARE INSTRUCTIONS  Only take over-the-counter or prescription medicines for pain, discomfort, or fever as directed by your caregiver.  Use a cool mist humidifier to make breathing easier.  Get plenty of rest until your temperature returns to normal. This usually takes 3 to 4 days.  Drink enough fluids to keep your urine clear or pale yellow.  Cover your mouth and nose when coughing or sneezing, and wash your hands well to avoid spreading the virus.  Stay home from work or school until your fever has been gone for at least 1 full day. SEEK MEDICAL CARE IF:   You have chest pain or a deep cough that worsens or produces more mucus.  You have nausea, vomiting, or diarrhea. SEEK IMMEDIATE MEDICAL CARE IF:   You have difficulty breathing, shortness of breath, or your skin or nails turn bluish.  You have severe neck pain or stiffness.  You have a severe headache, facial pain, or earache.  You have a worsening or recurring fever.  You have nausea or vomiting that cannot be controlled. MAKE SURE YOU:  Understand these instructions.  Will watch your condition.  Will get help right away  if you are not doing well or get worse. Document Released: 07/14/2000 Document Revised: 01/16/2012 Document Reviewed: 10/16/2011 [facility] Patient Information 2014 [place], [place].

## 2013-10-28 ENCOUNTER — Telehealth: Payer: Self-pay | Admitting: Family Medicine

## 2013-10-28 NOTE — Telephone Encounter (Signed)
Relevant patient education mailed to patient.  

## 2013-11-03 ENCOUNTER — Ambulatory Visit (INDEPENDENT_AMBULATORY_CARE_PROVIDER_SITE_OTHER): Payer: BC Managed Care – PPO | Admitting: Physician Assistant

## 2013-11-03 ENCOUNTER — Encounter: Payer: Self-pay | Admitting: Physician Assistant

## 2013-11-03 ENCOUNTER — Telehealth: Payer: Self-pay | Admitting: Family Medicine

## 2013-11-03 VITALS — BP 116/67 | HR 85 | Temp 98.5°F | Wt 122.0 lb

## 2013-11-03 DIAGNOSIS — J111 Influenza due to unidentified influenza virus with other respiratory manifestations: Secondary | ICD-10-CM

## 2013-11-03 NOTE — Progress Notes (Signed)
Patient presents to clinic today c/o continue cough. Patient was seen 1 week ago and tested positive for influenza.  Patient given regimen of Tamiflu.  Endorses taking as directed.  Patient states other symptoms have resolved but cough has persisted. Cough is typically non-productive.  If productive, patient notes clear sputum.  Denies SOB or wheezing.  Denies recurrence of fever.  Patient's husband was diagnosed with the flu this am.  Past Medical History  Diagnosis Date  . Esophageal spasm   . GERD (gastroesophageal reflux disease)   . Chronic gastritis   . Diverticulosis   . B12 deficiency   . IBS (irritable bowel syndrome)   . Anxiety   . Depression   . Hiatal hernia   . Iron deficiency anemia   . Hyperlipidemia   . Diverticulitis   . Barrett esophagus   . Rosacea   . Interstitial cystitis     Current Outpatient Prescriptions on File Prior to Visit  Medication Sig Dispense Refill  . acetaminophen (TYLENOL) 500 MG tablet Take 500 mg by mouth every 6 (six) hours as needed.        Marland Kitchen albuterol (PROAIR HFA) 108 (90 BASE) MCG/ACT inhaler Inhale 2 puffs into the lungs every 6 (six) hours as needed for wheezing.  1 Inhaler  0  . benzonatate (TESSALON PERLES) 100 MG capsule Take 1 capsule (100 mg total) by mouth 2 (two) times daily as needed for cough.  20 capsule  0  . dicyclomine (BENTYL) 10 MG capsule Take 1 capsule by mouth three times daily as needed for cramping and spasms.  30 capsule  0  . esomeprazole (NEXIUM) 40 MG capsule Take 40 mg by mouth 2 (two) times daily before a meal.      . estradiol (VIVELLE-DOT) 0.1 MG/24HR Place 1 patch onto the skin 2 (two) times a week.        Marland Kitchen LORazepam (ATIVAN) 0.5 MG tablet Take 0.5 mg by mouth at bedtime. At bedtime       . sertraline (ZOLOFT) 50 MG tablet Take 50 mg by mouth at bedtime.      . simvastatin (ZOCOR) 20 MG tablet Take 20 mg by mouth daily at 6 PM.      . cetirizine (ZYRTEC) 10 MG tablet Take 10 mg by mouth daily.       No  current facility-administered medications on file prior to visit.    Allergies  Allergen Reactions  . Amoxicillin-Pot Clavulanate Shortness Of Breath, Itching and Swelling  . Ciprofloxacin Shortness Of Breath, Itching and Swelling  . Moxifloxacin Shortness Of Breath, Itching and Swelling  . Penicillins Shortness Of Breath, Itching and Swelling  . Prednisone Other (See Comments)    GI irritation  . Sulfonamide Derivatives Shortness Of Breath, Itching and Swelling    Family History  Problem Relation Age of Onset  . Colon cancer Neg Hx   . Pancreatic cancer Mother   . Pancreatic cancer Father   . Colon polyps Mother   . Diabetes Father   . Heart disease Father   . Leukemia Father   . Prostate cancer Father   . Hypertension Father     History   Social History  . Marital Status: Married    Spouse Name: N/A    Number of Children: N/A  . Years of Education: N/A   Social History Main Topics  . Smoking status: Current Every Day Smoker -- 0.50 packs/day for 45 years    Types: Cigarettes  . Smokeless tobacco:  Never Used  . Alcohol Use: No  . Drug Use: No  . Sexual Activity: Not on file   Other Topics Concern  . Not on file   Social History Narrative  . No narrative on file   Review of Systems - See HPI.  All other ROS are negative.  BP 116/67  Pulse 85  Temp(Src) 98.5 F (36.9 C) (Oral)  Wt 122 lb (55.339 kg)  SpO2 94%  Physical Exam  Vitals reviewed. Constitutional: She is oriented to person, place, and time and well-developed, well-nourished, and in no distress.  HENT:  Head: Normocephalic and atraumatic.  Right Ear: External ear normal.  Left Ear: External ear normal.  Nose: Nose normal.  Mouth/Throat: Oropharynx is clear and moist. No oropharyngeal exudate.  Eyes: Conjunctivae are normal. Pupils are equal, round, and reactive to light.  Neck: Neck supple.  Cardiovascular: Normal rate, regular rhythm, normal heart sounds and intact distal pulses.    Pulmonary/Chest: Effort normal and breath sounds normal. No respiratory distress. She has no wheezes. She has no rales. She exhibits no tenderness.  Lymphadenopathy:    She has no cervical adenopathy.  Neurological: She is alert and oriented to person, place, and time.  Skin: Skin is warm and dry. No rash noted.  Psychiatric: Affect normal.   Recent Results (from the past 2160 hour(s))  CBC WITH DIFFERENTIAL     Status: None   Collection Time    09/19/13  4:10 PM      Result Value Ref Range   WBC 5.6  4.0 - 10.5 K/uL   RBC 4.70  3.87 - 5.11 MIL/uL   Hemoglobin 14.0  12.0 - 15.0 g/dL   HCT 42.2  36.0 - 46.0 %   MCV 89.8  78.0 - 100.0 fL   MCH 29.8  26.0 - 34.0 pg   MCHC 33.2  30.0 - 36.0 g/dL   RDW 13.9  11.5 - 15.5 %   Platelets 237  150 - 400 K/uL   Neutrophils Relative % 61  43 - 77 %   Neutro Abs 3.4  1.7 - 7.7 K/uL   Lymphocytes Relative 29  12 - 46 %   Lymphs Abs 1.6  0.7 - 4.0 K/uL   Monocytes Relative 5  3 - 12 %   Monocytes Absolute 0.3  0.1 - 1.0 K/uL   Eosinophils Relative 5  0 - 5 %   Eosinophils Absolute 0.3  0.0 - 0.7 K/uL   Basophils Relative 1  0 - 1 %   Basophils Absolute 0.1  0.0 - 0.1 K/uL  COMPREHENSIVE METABOLIC PANEL     Status: Abnormal   Collection Time    09/19/13  4:10 PM      Result Value Ref Range   Sodium 139  137 - 147 mEq/L   Potassium 4.1  3.7 - 5.3 mEq/L   Chloride 99  96 - 112 mEq/L   CO2 27  19 - 32 mEq/L   Glucose, Bld 95  70 - 99 mg/dL   BUN 11  6 - 23 mg/dL   Creatinine, Ser 0.70  0.50 - 1.10 mg/dL   Calcium 9.4  8.4 - 10.5 mg/dL   Total Protein 8.0  6.0 - 8.3 g/dL   Albumin 3.8  3.5 - 5.2 g/dL   AST 12  0 - 37 U/L   ALT 8  0 - 35 U/L   Alkaline Phosphatase 79  39 - 117 U/L   Total Bilirubin 0.3  0.3 - 1.2 mg/dL   GFR calc non Af Amer 89 (*) >90 mL/min   GFR calc Af Amer >90  >90 mL/min   Comment: (NOTE)     The eGFR has been calculated using the CKD EPI equation.     This calculation has not been validated in all clinical  situations.     eGFR's persistently <90 mL/min signify possible Chronic Kidney     Disease.  URINALYSIS, ROUTINE W REFLEX MICROSCOPIC     Status: Abnormal   Collection Time    09/19/13  4:27 PM      Result Value Ref Range   Color, Urine YELLOW  YELLOW   APPearance HAZY (*) CLEAR   Specific Gravity, Urine 1.024  1.005 - 1.030   pH 5.0  5.0 - 8.0   Glucose, UA NEGATIVE  NEGATIVE mg/dL   Hgb urine dipstick NEGATIVE  NEGATIVE   Bilirubin Urine NEGATIVE  NEGATIVE   Ketones, ur NEGATIVE  NEGATIVE mg/dL   Protein, ur NEGATIVE  NEGATIVE mg/dL   Urobilinogen, UA 0.2  0.0 - 1.0 mg/dL   Nitrite NEGATIVE  NEGATIVE   Leukocytes, UA NEGATIVE  NEGATIVE   Comment: MICROSCOPIC NOT DONE ON URINES WITH NEGATIVE PROTEIN, BLOOD, LEUKOCYTES, NITRITE, OR GLUCOSE <1000 mg/dL.  CBC     Status: Abnormal   Collection Time    09/21/13 12:29 AM      Result Value Ref Range   WBC 4.4  4.0 - 10.5 K/uL   RBC 3.65 (*) 3.87 - 5.11 MIL/uL   Hemoglobin 10.9 (*) 12.0 - 15.0 g/dL   Comment: DELTA CHECK NOTED     REPEATED TO VERIFY   HCT 32.8 (*) 36.0 - 46.0 %   MCV 89.9  78.0 - 100.0 fL   MCH 29.9  26.0 - 34.0 pg   MCHC 33.2  30.0 - 36.0 g/dL   RDW 13.9  11.5 - 15.5 %   Platelets 179  150 - 400 K/uL   Comment: DELTA CHECK NOTED     REPEATED TO VERIFY  SURGICAL PCR SCREEN     Status: None   Collection Time    09/21/13  1:34 PM      Result Value Ref Range   MRSA, PCR NEGATIVE  NEGATIVE   Staphylococcus aureus NEGATIVE  NEGATIVE   Comment:            The Xpert SA Assay (FDA     approved for NASAL specimens     in patients over 34 years of age),     is one component of     a comprehensive surveillance     program.  Test performance has     been validated by [employer] for patients greater     than or equal to 19 year old.     It is not intended     to diagnose infection nor to     guide or monitor treatment.   Assessment/Plan: Influenza Resolving.  Still with continued cough that patient  endorses is improving.  Continue tessalon perles and supportive measures.  Call office if symptoms are not continuing to improve.  Keep hands washed and spray lysol on surfaces.  Avoid close contact with husband while he recovers.

## 2013-11-03 NOTE — Telephone Encounter (Signed)
Relevant patient education mailed to patient.  

## 2013-11-03 NOTE — Progress Notes (Signed)
Pre-visit discussion using our clinic review tool. No additional management support is needed unless otherwise documented below in the visit note.  

## 2013-11-03 NOTE — Patient Instructions (Signed)
I feel your symptoms are remnants from your recent Flu infection.  Continue supportive measures including increased fluid intake, probiotic, saline nasal spray, humidifier in bedroom.  Also continue Tessalon Perles as needed for cough.  Take Mucinex if needed for chest congestion.  If symptoms are not improved by the end of the week, call the office and I will consider starting antibiotic therapy.  Please keep your hands washed and lysol surfaces to prevent catching the flu again from your husband. I hope you both feel better.

## 2013-11-03 NOTE — Assessment & Plan Note (Signed)
Resolving.  Still with continued cough that patient endorses is improving.  Continue tessalon perles and supportive measures.  Call office if symptoms are not continuing to improve.  Keep hands washed and spray lysol on surfaces.  Avoid close contact with husband while he recovers.

## 2014-01-28 ENCOUNTER — Encounter: Payer: Self-pay | Admitting: Family Medicine

## 2014-01-28 ENCOUNTER — Encounter: Payer: Self-pay | Admitting: General Practice

## 2014-01-28 ENCOUNTER — Ambulatory Visit (INDEPENDENT_AMBULATORY_CARE_PROVIDER_SITE_OTHER): Payer: BC Managed Care – PPO | Admitting: Family Medicine

## 2014-01-28 VITALS — BP 120/74 | HR 80 | Temp 98.5°F | Resp 17 | Wt 119.1 lb

## 2014-01-28 DIAGNOSIS — B370 Candidal stomatitis: Secondary | ICD-10-CM

## 2014-01-28 DIAGNOSIS — J441 Chronic obstructive pulmonary disease with (acute) exacerbation: Secondary | ICD-10-CM

## 2014-01-28 MED ORDER — IPRATROPIUM-ALBUTEROL 0.5-2.5 (3) MG/3ML IN SOLN
3.0000 mL | Freq: Once | RESPIRATORY_TRACT | Status: AC
  Administered 2014-01-28: 3 mL via RESPIRATORY_TRACT

## 2014-01-28 MED ORDER — METHYLPREDNISOLONE ACETATE 80 MG/ML IJ SUSP
80.0000 mg | Freq: Once | INTRAMUSCULAR | Status: AC
  Administered 2014-01-28: 80 mg via INTRAMUSCULAR

## 2014-01-28 MED ORDER — PROMETHAZINE-DM 6.25-15 MG/5ML PO SYRP: 5.0000 mL | ORAL_SOLUTION | Freq: Four times a day (QID) | ORAL | Status: DC | PRN

## 2014-01-28 MED ORDER — BECLOMETHASONE DIPROPIONATE 80 MCG/ACT IN AERS: 2.0000 | INHALATION_SPRAY | Freq: Two times a day (BID) | RESPIRATORY_TRACT | Status: DC

## 2014-01-28 MED ORDER — NYSTATIN 100000 UNIT/ML MT SUSP: 5.0000 mL | Freq: Four times a day (QID) | OROMUCOSAL | Status: DC

## 2014-01-28 NOTE — Assessment & Plan Note (Signed)
New to provider, ongoing for pt.  Reports she is unable to take oral prednisone due to severe GI upset.  Depomedrol injxn given.  Start daily Qvar use to improve air movement and decrease wheezing.  Continue Albuterol as needed.  Finish Levaquin as directed.  Encouraged smoking cessation.  Reviewed supportive care and red flags that should prompt return.  Pt expressed understanding and is in agreement w/ plan.

## 2014-01-28 NOTE — Progress Notes (Signed)
   Subjective:    Patient ID: Leah [name], female    DOB: 03/28/1946, 68 y.o.   MRN: [id number]  HPI COPD w/ bronchitis- pt was dx'd w/ bronchitis last week at [facility].  Was started on Levaquin (2 days remaining), Cheratussin, Albuterol nebs.  Pt continues to have shortness of breath, intermittent productive cough (green sputum).  No fevers.  No known sick contacts.  + wheezing.  No facial pain/pressure.  No ear pain.  Smoking 1/2 ppd.   Review of Systems For ROS see HPI     Objective:   Physical Exam  Vitals reviewed. Constitutional: She appears well-developed and well-nourished. No distress.  HENT:  Head: Normocephalic and atraumatic.  TMs normal bilaterally Mild nasal congestion Oropharynx w/ obvious thrush  Eyes: Conjunctivae and EOM are normal. Pupils are equal, round, and reactive to light.  Neck: Normal range of motion. Neck supple.  Cardiovascular: Normal rate, regular rhythm, normal heart sounds and intact distal pulses.   No murmur heard. Pulmonary/Chest: Effort normal. No respiratory distress. She has wheezes (diffuse inspiratory and expiratory wheezes- inspiratory wheezes cleared w/ neb tx, expiratory wheezes persist but improved).  + hacking cough  Lymphadenopathy:    She has no cervical adenopathy.          Assessment & Plan:

## 2014-01-28 NOTE — Assessment & Plan Note (Signed)
New.  Suspect this is due to albuterol use and abx use.  Start Nystatin swish and spit.  Reviewed supportive care and red flags that should prompt return.  Pt expressed understanding and is in agreement w/ plan.

## 2014-01-28 NOTE — Patient Instructions (Signed)
Follow up as needed Start the Qvar daily- 2 puffs twice daily as your controller medication Continue the Albuterol as needed for wheezing, chest tightness, shortness of breath Use the cough syrup as needed- will cause drowsiness Finish the Levaquin as directed Call with any questions or concerns Hang in there! Happy 4th of July!

## 2014-01-28 NOTE — Progress Notes (Signed)
Pre visit review using our clinic review tool, if applicable. No additional management support is needed unless otherwise documented below in the visit note. 

## 2014-02-05 ENCOUNTER — Telehealth: Payer: Self-pay | Admitting: Family Medicine

## 2014-02-05 NOTE — Telephone Encounter (Signed)
Message routed to Dr. Etter Sjogren and Kirtland Bouchard, [place].

## 2014-02-05 NOTE — Telephone Encounter (Signed)
[name] name: Dhruvi  Call back number:  [phone]   Reason for call:  Pt is needing FMLA forms to be retro for the dates that she was going to urgent care before she could get into our office.  Informed pt we would prob need the records from the Urgent cares as well.  Please call pt to advise.

## 2014-02-09 NOTE — Telephone Encounter (Signed)
Left a message for call back.  Pt needs an appointment (FMLA paperwork) and should be instructed to bring in records from her Urgent Care visits.

## 2014-02-09 NOTE — Telephone Encounter (Signed)
We do need records and it would be quicker and easier if pt comes in to have forms filled out

## 2014-02-10 DIAGNOSIS — Z0279 Encounter for issue of other medical certificate: Secondary | ICD-10-CM

## 2014-02-17 NOTE — Telephone Encounter (Signed)
Left a message with husband for call back.    Pt needs an appointment (FMLA paperwork) and should be instructed to bring in records from her Urgent Care visits.

## 2014-02-18 NOTE — Telephone Encounter (Addendum)
Pt stated that she was told by her job that we covered her on the FMLA from the time she saw Dr. Birdie Riddle.  She also shared that records from her UC visits were given to our office staff the week of July 1st and copies were made to be included with her paperwork.  She was under the impression that everything was taken care of because she spoke to [place] from [place] who said that she spoke to someone from our office on Monday, 02/09/14 and she was told that they would fax the [facility] paperwork that day.    Pt stated she would call her job tomorrow to clarify whether or not they received her paperwork and if it was complete.

## 2014-02-19 NOTE — Telephone Encounter (Signed)
LMOM @ (2:37pm) informing the pt that her FMLA forms where filled out and faxed to [facility] [facility] in [place] on (02/13/14).   Informed the pt if she has any questions to please give [place] a call back.//AB/CMA

## 2014-02-23 ENCOUNTER — Other Ambulatory Visit: Payer: Self-pay | Admitting: Nurse Practitioner

## 2014-02-23 ENCOUNTER — Telehealth: Payer: Self-pay | Admitting: Internal Medicine

## 2014-02-23 NOTE — Telephone Encounter (Signed)
Spoke with patient and she states she has been having LLQ pain for several days. Pain is just below belly button. Denies fever, constipation or diarrhea. She has multiple allergies. States she usually take Flagyl for this. Please, advise.

## 2014-02-23 NOTE — Telephone Encounter (Signed)
Left a message for patient to call back. 

## 2014-02-23 NOTE — Telephone Encounter (Addendum)
Received completed and signed FMLA form from Dr. Etter Sjogren on (02/10/14).  All forms faxed to [facility] on (02/13/14) at [phone]).  Confirmation received.//AB/CMA

## 2014-02-23 NOTE — Telephone Encounter (Signed)
Reviewed, she had "mild" diverticulosis of the resected sigmoid colon on colonoscopy in Oct 2014, please modify diet to soft and full liquids x 3 days, add Levsin SL.125 mg, #30, 1 SL q 4 hours prn abd. pain, and Flagyl 250 mg po tid, #30, no refill.

## 2014-02-24 MED ORDER — METRONIDAZOLE 250 MG PO TABS: 250.0000 mg | ORAL_TABLET | Freq: Three times a day (TID) | ORAL | Status: DC

## 2014-02-24 NOTE — Telephone Encounter (Signed)
Left a message for patient that Flagyl has been sent to pharmacy and we are waiting on Dr. Olevia Perches to decide about another possible medication. Also, told her about recommendations for soft, full liquid diet x 3 days.

## 2014-02-24 NOTE — Telephone Encounter (Signed)
Add Bentyl 10 mg, #30, 1 po tid prn cramps., no refill.

## 2014-02-24 NOTE — Telephone Encounter (Signed)
Patient has an allergy to Amoxicillin(itching, SOB). An allergy alert is given with Levsin d/t this allergy. Do you want to order this for patient?

## 2014-02-25 MED ORDER — DICYCLOMINE HCL 10 MG PO CAPS: ORAL_CAPSULE | ORAL | Status: DC

## 2014-02-25 NOTE — Telephone Encounter (Signed)
Rx sent. Left a message for patient to call back. 

## 2014-02-26 NOTE — Telephone Encounter (Signed)
Left a message for patient to call me. 

## 2014-03-02 ENCOUNTER — Ambulatory Visit (INDEPENDENT_AMBULATORY_CARE_PROVIDER_SITE_OTHER): Payer: BC Managed Care – PPO | Admitting: Gastroenterology

## 2014-03-02 ENCOUNTER — Encounter: Payer: Self-pay | Admitting: Gastroenterology

## 2014-03-02 ENCOUNTER — Other Ambulatory Visit (INDEPENDENT_AMBULATORY_CARE_PROVIDER_SITE_OTHER): Payer: BC Managed Care – PPO

## 2014-03-02 ENCOUNTER — Other Ambulatory Visit: Payer: Self-pay | Admitting: *Deleted

## 2014-03-02 ENCOUNTER — Ambulatory Visit (INDEPENDENT_AMBULATORY_CARE_PROVIDER_SITE_OTHER)
Admission: RE | Admit: 2014-03-02 | Discharge: 2014-03-02 | Disposition: A | Discharge status: Home / Self Care | Payer: BC Managed Care – PPO | Source: Ambulatory Visit | Attending: Gastroenterology | Admitting: Gastroenterology

## 2014-03-02 VITALS — BP 100/60 | HR 66 | Ht 61.0 in | Wt 117.6 lb

## 2014-03-02 DIAGNOSIS — R1032 Left lower quadrant pain: Secondary | ICD-10-CM

## 2014-03-02 DIAGNOSIS — Z8719 Personal history of other diseases of the digestive system: Secondary | ICD-10-CM | POA: Insufficient documentation

## 2014-03-02 HISTORY — DX: Personal history of other diseases of the digestive system: Z87.19

## 2014-03-02 LAB — CBC WITH DIFFERENTIAL/PLATELET
BASOS PCT: 0.5 % (ref 0.0–3.0)
Basophils Absolute: 0 10*3/uL (ref 0.0–0.1)
EOS PCT: 1.7 % (ref 0.0–5.0)
Eosinophils Absolute: 0.1 10*3/uL (ref 0.0–0.7)
HCT: 43.5 % (ref 36.0–46.0)
Hemoglobin: 14.7 g/dL (ref 12.0–15.0)
LYMPHS PCT: 25.2 % (ref 12.0–46.0)
Lymphs Abs: 1.9 10*3/uL (ref 0.7–4.0)
MCHC: 33.8 g/dL (ref 30.0–36.0)
MCV: 89.5 fl (ref 78.0–100.0)
Monocytes Absolute: 0.5 10*3/uL (ref 0.1–1.0)
Monocytes Relative: 6.4 % (ref 3.0–12.0)
Neutro Abs: 5 10*3/uL (ref 1.4–7.7)
Neutrophils Relative %: 66.2 % (ref 43.0–77.0)
Platelets: 256 10*3/uL (ref 150.0–400.0)
RBC: 4.86 Mil/uL (ref 3.87–5.11)
RDW: 15.5 % (ref 11.5–15.5)
WBC: 7.6 10*3/uL (ref 4.0–10.5)

## 2014-03-02 LAB — COMPREHENSIVE METABOLIC PANEL
ALT: 12 U/L (ref 0–35)
AST: 15 U/L (ref 0–37)
Albumin: 3.9 g/dL (ref 3.5–5.2)
Alkaline Phosphatase: 54 U/L (ref 39–117)
BUN: 14 mg/dL (ref 6–23)
CALCIUM: 9 mg/dL (ref 8.4–10.5)
CO2: 32 mEq/L (ref 19–32)
Chloride: 99 mEq/L (ref 96–112)
Creatinine, Ser: 1.1 mg/dL (ref 0.4–1.2)
GFR: 55.17 mL/min — ABNORMAL LOW (ref >60.00)
GLUCOSE: 80 mg/dL (ref 70–99)
Potassium: 4.4 mEq/L (ref 3.5–5.1)
Sodium: 136 mEq/L (ref 135–145)
Total Bilirubin: 0.5 mg/dL (ref 0.2–1.2)
Total Protein: 6.9 g/dL (ref 6.0–8.3)

## 2014-03-02 MED ORDER — IOHEXOL 300 MG/ML  SOLN
100.0000 mL | Freq: Once | INTRAMUSCULAR | Status: AC | PRN
  Administered 2014-03-02: 100 mL via INTRAVENOUS

## 2014-03-02 NOTE — Patient Instructions (Addendum)
Your physician has requested that you go to the basement for the following lab work before leaving today: CBC CMET  Continue Flagyl ____________________________________________ Leah [name] have been scheduled for a CT scan of the abdomen and pelvis at [facility] ([address]---this is in the same building as [facility]).   You are scheduled on 03-02-2014 at 4 pm. You should arrive 15 minutes prior to your appointment time for registration. Please follow the written instructions below on the day of your exam:  WARNING: IF YOU ARE ALLERGIC TO IODINE/X-RAY DYE, PLEASE NOTIFY RADIOLOGY IMMEDIATELY AT [phone]! YOU WILL BE GIVEN A 13 HOUR PREMEDICATION PREP.  1) Do not eat or drink anything after 12 pm (4 hours prior to your test) 2) You have been given 2 bottles of oral contrast to drink. The solution may taste better if refrigerated, but do NOT add ice or any other liquid to this solution. Shake well before drinking.    Drink 1 bottle of contrast @ 2 pm (2 hours prior to your exam)  Drink 1 bottle of contrast @ 3 pm (1 hour prior to your exam)  You may take any medications as prescribed with a small amount of water except for the following: Metformin, Glucophage, Glucovance, Avandamet, Riomet, Fortamet, Actoplus Met, Janumet, Glumetza or Metaglip. The above medications must be held the day of the exam AND 48 hours after the exam.  The purpose of you drinking the oral contrast is to aid in the visualization of your intestinal tract. The contrast solution may cause some diarrhea. Before your exam is started, you will be given a small amount of fluid to drink. Depending on your individual set of symptoms, you may also receive an intravenous injection of x-ray contrast/dye. Plan on being at [facility] for 30 minutes or long, depending on the type of exam you are having performed.  This test typically takes 30-45 minutes to complete.  If you have any questions regarding  your exam or if you need to reschedule, you may call the CT department at [phone] between the hours of 8:00 am and 5:00 pm, Monday-Friday.  ________________________________________________________________________

## 2014-03-02 NOTE — Telephone Encounter (Signed)
Still not feeling better and wants to be seen today Mon 03-02-14.

## 2014-03-02 NOTE — Progress Notes (Signed)
     03/02/2014 Leah [name] [id number] 1945/10/20   History of Present Illness:  This is a 68 year old female who is well-known to Dr. Olevia Perches. She is frequently seen for chronic/recurrent complaints of left lower quadrant abdominal pain. She does have a history of sigmoid resection for recurrent diverticulitis in March 2010, and she's had radiographic findings on CT scan consistent with diverticulitis since that time. She also has history of small anastomotic ulcers. Her last appointment here was in September 2014.  At that time she was scheduled for colonoscopy, which she underwent in October 2014. She had one sessile polyp in the descending colon that was a tubular adenoma on pathology. She also had mild diverticulosis with findings consistent with previous colonic anastomosis in the sigmoid colon.  Small internal hemorrhoids noted as well. She presents to our office today with complaints of sudden onset of left lower quadrant abdominal pain one week ago.  She called our office on July 28 and a prescription for Flagyl 250 mg 3 times daily was sent to her pharmacy. She is allergic to Cipro and Augmentin (amoxicillin/penicillin).  She was also given Bentyl 10 mg to take 3 times a day as needed for cramps. She called back on August 3 stating that her symptoms were not improved despite the medications that she was taking.  She does take MiraLax daily but says that since taking the Bentyl she had become more constipated and her usual MiraLax was not helping with that.  Denies fevers or chills, nausea and vomiting.  Just of note, she did have a CT scan performed in February of this year and she actually had early acute appendicitis. She subsequently underwent appendectomy.    Current Medications, Allergies, Past Medical History, Past Surgical History, Family History and Social History were reviewed in [employer] record.   Physical Exam: BP 100/60  Pulse 66  Ht 5\' 1"  (1.549  m)  Wt 117 lb 9.6 oz (53.343 kg)  BMI 22.23 kg/m2 General: Well developed female in no acute distress Head: Normocephalic and atraumatic Eyes:  Sclerae anicteric, conjunctiva pink  Ears: Normal auditory acuity Lungs: Clear throughout to auscultation Heart: Regular rate and rhythm Abdomen: Soft, non-distended.  Normal bowel sounds.  Moderate LLQ TTP. Musculoskeletal: Symmetrical with no gross deformities  Extremities: No edema  Neurological: Alert oriented x 4, grossly non-focal Psychological:  Alert and cooperative. Normal mood and affect  Assessment and Recommendations: -LLQ abdominal pain:  Has chronic/recurrent complaints of LLQ abdominal pain.  Has history of sigmoid resection for recurrent diverticulitis.  This episode of pain was acute in onset just one week ago and not getting better on flagyl.  Will check CT scan of the abdomen and pelvis with contrast.  CBC and CMP as well.  Will continue flagyl 250 mg TID for now.

## 2014-03-02 NOTE — Telephone Encounter (Signed)
Spoke with patient and she is not getting any better with Flagyl. States she is now having constipation and Miralax is not helping. Patient scheduled with Alonza Bogus, PA today at 1:30 PM.

## 2014-03-18 ENCOUNTER — Encounter: Payer: Self-pay | Admitting: Internal Medicine

## 2014-03-18 ENCOUNTER — Encounter: Payer: Self-pay | Admitting: Gastroenterology

## 2014-05-05 ENCOUNTER — Encounter: Payer: Self-pay | Admitting: Medical

## 2014-05-05 ENCOUNTER — Telehealth: Payer: Self-pay | Admitting: Medical

## 2014-05-05 ENCOUNTER — Ambulatory Visit (INDEPENDENT_AMBULATORY_CARE_PROVIDER_SITE_OTHER): Payer: BC Managed Care – PPO | Admitting: Medical

## 2014-05-05 VITALS — BP 146/85 | HR 70 | Temp 98.8°F | Ht 62.4 in | Wt 118.6 lb

## 2014-05-05 DIAGNOSIS — E785 Hyperlipidemia, unspecified: Secondary | ICD-10-CM

## 2014-05-05 DIAGNOSIS — I10 Essential (primary) hypertension: Secondary | ICD-10-CM

## 2014-05-05 LAB — COMPREHENSIVE METABOLIC PANEL
ALBUMIN: 4.4 g/dL (ref 3.5–5.2)
ALK PHOS: 64 U/L (ref 39–117)
ALT: 14 U/L (ref 0–35)
AST: 16 U/L (ref 0–37)
BUN: 11 mg/dL (ref 6–23)
CO2: 29 mEq/L (ref 19–32)
Calcium: 9.2 mg/dL (ref 8.4–10.5)
Chloride: 102 mEq/L (ref 96–112)
Creatinine, Ser: 0.7 mg/dL (ref 0.4–1.2)
GFR: 90.49 mL/min (ref >60.00)
Glucose, Bld: 84 mg/dL (ref 70–99)
POTASSIUM: 4.1 meq/L (ref 3.5–5.1)
Sodium: 138 mEq/L (ref 135–145)
TOTAL PROTEIN: 8 g/dL (ref 6.0–8.3)
Total Bilirubin: 0.4 mg/dL (ref 0.2–1.2)

## 2014-05-05 LAB — LIPID PANEL
CHOL/HDL RATIO: 8
Cholesterol: 289 mg/dL — ABNORMAL HIGH (ref 0–200)
HDL: 36.9 mg/dL — ABNORMAL LOW (ref >39.00)
LDL CALC: 220 mg/dL — AB (ref 0–99)
NonHDL: 252.1
TRIGLYCERIDES: 159 mg/dL — AB (ref 0.0–149.0)
VLDL: 31.8 mg/dL (ref 0.0–40.0)

## 2014-05-05 MED ORDER — LOSARTAN POTASSIUM 50 MG PO TABS: 50.0000 mg | ORAL_TABLET | Freq: Every day | ORAL | Status: DC

## 2014-05-05 MED ORDER — SIMVASTATIN 40 MG PO TABS: 40.0000 mg | ORAL_TABLET | Freq: Every day | ORAL | Status: DC

## 2014-05-05 MED ORDER — LISINOPRIL 5 MG PO TABS: 5.0000 mg | ORAL_TABLET | Freq: Every day | ORAL | Status: DC

## 2014-05-05 NOTE — Telephone Encounter (Signed)
Getting LPN to call pt and advise not to take lisinopril due to possible side effect. But send losartan in place of lisinopril. A pulmonologist thought that ace inhibitor med like lisinopril could play a role in copd flair. So will get Santiago Glad to advise pt. The lisinopril only cost her 40 cents.

## 2014-05-05 NOTE — Telephone Encounter (Signed)
Call in higher dose of simvistatin since her cholesterol and ldl is quite elevated.

## 2014-05-05 NOTE — Progress Notes (Signed)
   Subjective:    Patient ID: Leah [name], female    DOB: 12-Feb-1946, 68 y.o.   MRN: [id number]  HPI  Pt in with report of home reading recently of 160/90, 157/88, 155/85, 157/85, 157/85, 154/83,145/85 148/83, 146/73, 148/79 and 140/82. All readings since this Friday. Faint head pressure but no defintie ha and no neurologic signs or symptoms. No chest pain.   Pt states in past lisinopril was too strong and dropped her bp at 10 mg. But no side effect reported. She does not want to be on same dose expresses being on smaller dose.  History of hyperlipidemia. She mentioned this at end of interview as sending her over for labs. She has 2 more tabs of her simvistatin.   Review of Systems  Constitutional: Negative for fever, chills and fatigue.  HENT: Negative.   Respiratory: Negative for cough, choking, chest tightness, shortness of breath and wheezing.   Cardiovascular: Negative for chest pain, palpitations and leg swelling.  Gastrointestinal: Negative.   Genitourinary: Negative.   Musculoskeletal: Negative for back pain.  Neurological: Negative for dizziness, tremors, seizures, syncope, facial asymmetry, speech difficulty, weakness, light-headedness, numbness and headaches.       Only reports occasional faint head pressure. Minimal and transient.  Hematological: Negative for adenopathy.  Psychiatric/Behavioral: Negative.        Objective:   Physical Exam  General Mental Status- Alert. General Appearance- Not in acute distress.   Skin General: Color- Normal Color. Moisture- Normal Moisture.  Neck Carotid Arteries- Normal color. Moisture- Normal Moisture. No carotid bruits. No JVD.  Chest and Lung Exam Auscultation: Breath Sounds:-Normal, clear, even and unlabored.  Cardiovascular Auscultation:Rythm- Regular, rate and rhythm. Murmurs & Other Heart Sounds:Auscultation of the heart reveals- No Murmurs.  Abdomen Inspection:-Inspeection  Normal. Palpation/Percussion:Note:No mass. Palpation and Percussion of the abdomen reveal- Non Tender, Non Distended + BS, no rebound or guarding.    Neurologic Cranial Nerve exam:- CN III-XII intact(No nystagmus), symmetric smile. Drift Test:- No drift. Romberg Exam:- Negative.  Finger to Nose:- Normal/Intact Strength:- 5/5 equal and symmetric strength both upper and lower extremities.        Assessment & Plan:

## 2014-05-05 NOTE — Patient Instructions (Addendum)
For your htn, I will prescribe lisinopril 5 mg tab. Recheck your bp daily and should see decrease within a week. Pt will get a call from my lpn Santiago Glad regarding using losartan and stopping lisinopril later. This change was made after the visit.  We will get labs today and check cholesterol as well. Then refill simvistatin accordingly.  Follow up in one month or as needed. We need to check your bp at that time to determine if dose of bp med dose is adequate or if you need higher dose.

## 2014-05-05 NOTE — Assessment & Plan Note (Signed)
Will get lipid panel today and then refill her med accordingly. Also got cmp today.

## 2014-05-05 NOTE — Assessment & Plan Note (Addendum)
I did not see pulmonologist note in the chartat the time lisinopril was written. So I will send losartan  rx to pt pharmacy. Send message to Vernie Shanks Lpn to call pt tomorrow and advise not to take Lisinopril but to use losartan in place of.

## 2014-05-06 NOTE — Telephone Encounter (Signed)
Patient advised of lab results and medication changes. Reviewed HDL and LDL ranges per her request. Appointment scheduled for 3 months.

## 2014-05-08 ENCOUNTER — Emergency Department (HOSPITAL_BASED_OUTPATIENT_CLINIC_OR_DEPARTMENT_OTHER): Payer: BC Managed Care – PPO

## 2014-05-08 ENCOUNTER — Encounter (HOSPITAL_BASED_OUTPATIENT_CLINIC_OR_DEPARTMENT_OTHER): Payer: Self-pay | Admitting: Emergency Medicine

## 2014-05-08 ENCOUNTER — Ambulatory Visit (INDEPENDENT_AMBULATORY_CARE_PROVIDER_SITE_OTHER): Payer: BC Managed Care – PPO | Admitting: Medical

## 2014-05-08 ENCOUNTER — Encounter: Payer: Self-pay | Admitting: Medical

## 2014-05-08 ENCOUNTER — Emergency Department (HOSPITAL_BASED_OUTPATIENT_CLINIC_OR_DEPARTMENT_OTHER)
Admission: EM | Admit: 2014-05-08 | Discharge: 2014-05-08 | Disposition: A | Discharge status: Home / Self Care | Payer: BC Managed Care – PPO | Attending: Emergency Medicine | Admitting: Emergency Medicine

## 2014-05-08 VITALS — BP 145/78 | HR 87 | Temp 98.8°F | Ht 62.4 in | Wt 118.0 lb

## 2014-05-08 DIAGNOSIS — Z79899 Other long term (current) drug therapy: Secondary | ICD-10-CM | POA: Insufficient documentation

## 2014-05-08 DIAGNOSIS — M25521 Pain in right elbow: Secondary | ICD-10-CM

## 2014-05-08 DIAGNOSIS — M79601 Pain in right arm: Secondary | ICD-10-CM | POA: Diagnosis present

## 2014-05-08 DIAGNOSIS — Z862 Personal history of diseases of the blood and blood-forming organs and certain disorders involving the immune mechanism: Secondary | ICD-10-CM | POA: Diagnosis not present

## 2014-05-08 DIAGNOSIS — E785 Hyperlipidemia, unspecified: Secondary | ICD-10-CM | POA: Diagnosis not present

## 2014-05-08 DIAGNOSIS — Z872 Personal history of diseases of the skin and subcutaneous tissue: Secondary | ICD-10-CM | POA: Diagnosis not present

## 2014-05-08 DIAGNOSIS — Z7952 Long term (current) use of systemic steroids: Secondary | ICD-10-CM | POA: Insufficient documentation

## 2014-05-08 DIAGNOSIS — K219 Gastro-esophageal reflux disease without esophagitis: Secondary | ICD-10-CM | POA: Insufficient documentation

## 2014-05-08 DIAGNOSIS — R5383 Other fatigue: Secondary | ICD-10-CM

## 2014-05-08 DIAGNOSIS — Z87448 Personal history of other diseases of urinary system: Secondary | ICD-10-CM | POA: Insufficient documentation

## 2014-05-08 DIAGNOSIS — F419 Anxiety disorder, unspecified: Secondary | ICD-10-CM | POA: Insufficient documentation

## 2014-05-08 DIAGNOSIS — F329 Major depressive disorder, single episode, unspecified: Secondary | ICD-10-CM | POA: Diagnosis not present

## 2014-05-08 DIAGNOSIS — M25529 Pain in unspecified elbow: Secondary | ICD-10-CM | POA: Insufficient documentation

## 2014-05-08 DIAGNOSIS — M791 Myalgia: Secondary | ICD-10-CM | POA: Insufficient documentation

## 2014-05-08 DIAGNOSIS — M546 Pain in thoracic spine: Secondary | ICD-10-CM

## 2014-05-08 DIAGNOSIS — Z880 Allergy status to penicillin: Secondary | ICD-10-CM | POA: Diagnosis not present

## 2014-05-08 DIAGNOSIS — M549 Dorsalgia, unspecified: Secondary | ICD-10-CM | POA: Insufficient documentation

## 2014-05-08 DIAGNOSIS — Z720 Tobacco use: Secondary | ICD-10-CM | POA: Insufficient documentation

## 2014-05-08 DIAGNOSIS — M79621 Pain in right upper arm: Secondary | ICD-10-CM

## 2014-05-08 MED ORDER — CEPHALEXIN 500 MG PO CAPS: 500.0000 mg | ORAL_CAPSULE | Freq: Four times a day (QID) | ORAL | Status: DC

## 2014-05-08 MED ORDER — IBUPROFEN 800 MG PO TABS: 800.0000 mg | ORAL_TABLET | Freq: Three times a day (TID) | ORAL | Status: DC

## 2014-05-08 MED ORDER — DOXYCYCLINE HYCLATE 100 MG PO CAPS: 100.0000 mg | ORAL_CAPSULE | Freq: Two times a day (BID) | ORAL | Status: DC

## 2014-05-08 NOTE — Assessment & Plan Note (Signed)
Presents at end of the day. cmp normal the other day. Describes prominent fatigue. I think would benefit from stat cbc as we approach weekend so sent to ED for eval.

## 2014-05-08 NOTE — ED Provider Notes (Signed)
History/physical exam/procedure(s) were performed by non-physician practitioner and as supervising physician I was immediately available for consultation/collaboration. I have reviewed all notes and am in agreement with care and plan.   Shaune Pollack, MD 05/08/14 [phone]

## 2014-05-08 NOTE — Assessment & Plan Note (Signed)
Considered possible related to increase of simvastatin dose. I advised pt to decrease to lower dose again. Update [place] early next week if myalias worsen. In that event I think would be reasonable to stop simvastatin completley and at that point get rhabdomyolysis labs. Presently I don't think this is etiology of her pain. Note pt notes not thigh muscle pain.

## 2014-05-08 NOTE — ED Notes (Signed)
Right arm pain x 2 days. Started after being stuck for blood.

## 2014-05-08 NOTE — Assessment & Plan Note (Addendum)
With some rt arm pain. Again present at the end of the day. No chest pain but decided to ekg. Ekg looked ok to me. Do to local I considered that pt may benefit from further monitoring. Maybe cardiac enzymes would be beneficial. End of the day on Friday. Thought better to treat in ED.

## 2014-05-08 NOTE — Discharge Instructions (Signed)
Phlebitis Phlebitis is soreness and swelling (inflammation) of a vein. This can occur in your arms, legs, or torso (trunk), as well as deeper inside your body. Phlebitis is usually not serious when it occurs close to the surface of the body. However, it can cause serious problems when it occurs in a vein deeper inside the body. CAUSES  Phlebitis can be triggered by various things, including:   Reduced blood flow through your veins. This can happen with:  Bed rest over a long period.  Long-distance travel.  Injury.  Surgery.  Being overweight (obese) or pregnant.  Having an IV tube put in the vein and getting certain medicines through the vein.  Cancer and cancer treatment.  Use of illegal drugs taken through the vein.  Inflammatory diseases.  Inherited (genetic) diseases that increase the risk of blood clots.  Hormone therapy, such as birth control pills. SIGNS AND SYMPTOMS   Red, tender, swollen, and painful area on your skin. Usually, the area will be long and narrow.  Firmness along the center of the affected area. This can indicate that a blood clot has formed.  Low-grade fever. DIAGNOSIS  A health care provider can usually diagnose phlebitis by examining the affected area and asking about your symptoms. To check for infection or blood clots, your health care provider may order blood tests or an ultrasound exam of the area. Blood tests and your family history may also indicate if you have an underlying genetic disease that causes blood clots. Occasionally, a piece of tissue is taken from the body (biopsy sample) if an unusual cause of phlebitis is suspected. TREATMENT  Treatment will vary depending on the severity of the condition and the area of the body affected. Treatment may include:  Use of a warm compress or heating pad.  Use of compression stockings or bandages.  Anti-inflammatory medicines.  Removal of any IV tube that may be causing the problem.  Medicines  that kill germs (antibiotics) if an infection is present.  Blood-thinning medicines if a blood clot is suspected or present.  In rare cases, surgery may be needed to remove damaged sections of vein. HOME CARE INSTRUCTIONS   Only take over-the-counter or prescription medicines as directed by your health care provider. Take all medicines exactly as prescribed.  Raise (elevate) the affected area above the level of your heart as directed by your health care provider.  Apply a warm compress or heating pad to the affected area as directed by your health care provider. Do not sleep with the heating pad.  Use compression stockings or bandages as directed. These will speed healing and prevent the condition from coming back.  If you are on blood thinners:  Get follow-up blood tests as directed by your health care provider.  Check with your health care provider before using any new medicines.  Carry a medical alert card or wear your medical alert jewelry to show that you are on blood thinners.  For phlebitis in the legs:  Avoid prolonged standing or bed rest.  Keep your legs moving. Raise your legs when sitting or lying.  Do not smoke.  Women, particularly those over the age of 86, should consider the risks and benefits of taking the contraceptive pill. This kind of hormone treatment can increase your risk for blood clots.  Follow up with your health care provider as directed. SEEK MEDICAL CARE IF:   You have unusual bruising or any bleeding problems.  Your swelling or pain in the affected area  is not improving.  You are on anti-inflammatory medicine, and you develop belly (abdominal) pain. SEEK IMMEDIATE MEDICAL CARE IF:   You have a sudden onset of chest pain or difficulty breathing.  You have a fever or persistent symptoms for more than 2-3 days.  You have a fever and your symptoms suddenly get worse. MAKE SURE YOU:  Understand these instructions.  Will watch your  condition.  Will get help right away if you are not doing well or get worse. Document Released: 07/11/2001 Document Revised: 05/07/2013 Document Reviewed: 03/24/2013 [facility] Patient Information 2015 [place], [place]. This information is not intended to replace advice given to you by your health care provider. Make sure you discuss any questions you have with your health care provider.

## 2014-05-08 NOTE — Patient Instructions (Signed)
Pt to go to ED now. Charge nurse notified of patient presentation. Pt to follow up 5-7 days or as needed.

## 2014-05-08 NOTE — Progress Notes (Signed)
Pre visit review using our clinic review tool, if applicable. No additional management support is needed unless otherwise documented below in the visit note. 

## 2014-05-08 NOTE — ED Provider Notes (Signed)
CSN: [id number]     Arrival date & time 05/08/14  1657 History   First MD Initiated Contact with Patient 05/08/14 1705     Chief Complaint  Patient presents with  . Arm Pain     (Consider location/radiation/quality/duration/timing/severity/associated sxs/prior Treatment) Patient is a 68 y.o. female presenting with arm pain. The history is provided by the patient. No language interpreter was used.  Arm Pain This is a new problem. The current episode started in the past 7 days. The problem occurs constantly. The problem has been gradually worsening. Associated symptoms include joint swelling and myalgias. Nothing aggravates the symptoms. She has tried nothing for the symptoms. The treatment provided moderate relief.    Past Medical History  Diagnosis Date  . Esophageal spasm   . GERD (gastroesophageal reflux disease)   . Chronic gastritis   . Diverticulosis   . B12 deficiency   . IBS (irritable bowel syndrome)   . Anxiety   . Depression   . Hiatal hernia   . Iron deficiency anemia   . Hyperlipidemia   . Diverticulitis   . Barrett esophagus   . Rosacea   . Interstitial cystitis    Past Surgical History  Procedure Laterality Date  . Tonsillectomy and adenoidectomy    . Tmj arthroplasty    . Vocal cord polypectomy    . Carpal tunnel release  1993    right  . Abdominal hysterectomy    . Cholecystectomy    . Partial colectomy    . Sinus surgery with instatrak    . Laparoscopic appendectomy N/A 09/21/2013    Procedure: APPENDECTOMY LAPAROSCOPIC;  Surgeon: Rolm Bookbinder, MD;  Location: [facility] OR;  Service: General;  Laterality: N/A;   Family History  Problem Relation Age of Onset  . Colon cancer Neg Hx   . Pancreatic cancer Mother   . Pancreatic cancer Father   . Colon polyps Mother   . Diabetes Father   . Heart disease Father   . Leukemia Father   . Prostate cancer Father   . Hypertension Father    History  Substance Use Topics  . Smoking status: Current Every Day  Smoker -- 0.50 packs/day for 45 years    Types: Cigarettes  . Smokeless tobacco: Never Used  . Alcohol Use: No   OB History   Grav Para Term Preterm Abortions TAB SAB Ect Mult Living                 Review of Systems  Musculoskeletal: Positive for joint swelling and myalgias.  All other systems reviewed and are negative.     Allergies  Amoxicillin-pot clavulanate; Ciprofloxacin; Moxifloxacin; Penicillins; Prednisone; and Sulfonamide derivatives  Home Medications   Prior to Admission medications   Medication Sig Start Date End Date Taking? Authorizing Provider  acetaminophen (TYLENOL) 500 MG tablet Take 500 mg by mouth every 6 (six) hours as needed.      Historical Provider, MD  albuterol (PROAIR HFA) 108 (90 BASE) MCG/ACT inhaler Inhale 2 puffs into the lungs every 6 (six) hours as needed for wheezing. 01/10/13   Rosalita Chessman, DO  beclomethasone (QVAR) 80 MCG/ACT inhaler Inhale 2 puffs into the lungs 2 (two) times daily. 01/28/14   Midge Minium, MD  benzonatate (TESSALON PERLES) 100 MG capsule Take 1 capsule (100 mg total) by mouth 2 (two) times daily as needed for cough. 10/27/13   Brunetta Jeans, PA-C  dicyclomine (BENTYL) 10 MG capsule Take 1 capsule by mouth three times  daily as needed for cramping and spasms. 02/25/14   Lafayette Dragon, MD  esomeprazole (NEXIUM) 40 MG capsule Take 40 mg by mouth 2 (two) times daily before a meal.    Historical Provider, MD  estradiol (VIVELLE-DOT) 0.1 MG/24HR Place 1 patch onto the skin 2 (two) times a week.      Historical Provider, MD  lisinopril (PRINIVIL,ZESTRIL) 5 MG tablet Take 1 tablet (5 mg total) by mouth daily. 05/05/14   Meriam Sprague Saguier, PA-C  LORazepam (ATIVAN) 0.5 MG tablet Take 0.5 mg by mouth at bedtime. At bedtime     Historical Provider, MD  losartan (COZAAR) 50 MG tablet Take 1 tablet (50 mg total) by mouth daily. 05/05/14   Meriam Sprague Saguier, PA-C  metroNIDAZOLE (FLAGYL) 250 MG tablet Take 1 tablet (250 mg total) by mouth 3  (three) times daily. 02/24/14   Lafayette Dragon, MD  nystatin (MYCOSTATIN) 100000 UNIT/ML suspension Take 5 mLs (500,000 Units total) by mouth 4 (four) times daily. Swish and spit x1 week 01/28/14   Midge Minium, MD  promethazine-dextromethorphan (PROMETHAZINE-DM) 6.25-15 MG/5ML syrup Take 5 mLs by mouth 4 (four) times daily as needed. 01/28/14   Midge Minium, MD  sertraline (ZOLOFT) 50 MG tablet Take 50 mg by mouth at bedtime.    Historical Provider, MD  simvastatin (ZOCOR) 40 MG tablet Take 1 tablet (40 mg total) by mouth at bedtime. 05/05/14   Meriam Sprague Saguier, PA-C   BP 137/80  Pulse 78  Temp(Src) 98.2 F (36.8 C) (Oral)  Resp 20  Ht 5\' 1"  (1.549 m)  Wt 118 lb (53.524 kg)  BMI 22.31 kg/m2  SpO2 97% Physical Exam  ED Course  Procedures (including critical care time) Labs Review Labs Reviewed - No data to display  Imaging Review US Venous Img Upper Uni Right  05/08/2014   CLINICAL DATA:  Three-day history of upper extremity pain and swelling  EXAM: RIGHT UPPER EXTREMITY VENOUS DUPLEX ULTRASOUND  TECHNIQUE: Gray-scale sonography with graded compression, as well as color Doppler and duplex ultrasound were performed to evaluate the upper extremity deep venous system from the level of the subclavian vein and including the jugular, axillary, basilic, radial, ulnar and upper cephalic vein. Spectral Doppler was utilized to evaluate flow at rest and with distal augmentation maneuvers.  COMPARISON:  None.  FINDINGS: Internal Jugular Vein: No evidence of thrombus. Normal compressibility, respiratory phasicity and response to augmentation.  Subclavian Vein: No evidence of thrombus. Normal compressibility, respiratory phasicity and response to augmentation.  Axillary Vein: No evidence of thrombus. Normal compressibility, respiratory phasicity and response to augmentation.  Cephalic Vein: No evidence of thrombus in visualized portions. Portion of the cephalic vein cannot be visualized. Normal  compressibility, respiratory phasicity and response to augmentation.  Basilic Vein: No evidence of thrombus. Normal compressibility, respiratory phasicity and response to augmentation.  Brachial Veins: No evidence of thrombus. Normal compressibility, respiratory phasicity and response to augmentation.  Radial Veins: No evidence of thrombus. Normal compressibility, respiratory phasicity and response to augmentation.  Ulnar Veins: No evidence of thrombus. Normal compressibility, respiratory phasicity and response to augmentation.  Venous Reflux:  None visualized.  Other Findings:  None visualized.  IMPRESSION: A portion of the right cephalic vein cannot be visualized. The significance of this finding is uncertain. This finding could be indicative of congenital variant or possibly residua of chronic thrombus. There is no evidence of acute deep or superficial venous thrombosis in the right upper extremity venous system.   Electronically  Signed   By: Lowella Grip M.D.   On: 05/08/2014 18:03     EKG Interpretation None      MDM   Final diagnoses:  Right arm pain    Pt reports only antibiotic she can take is doxycycline.   Rx changed to Doxycycline    Fransico Meadow, PA-C 05/08/14 1832

## 2014-05-08 NOTE — Assessment & Plan Note (Signed)
In rt arm area of previous blood draw and mild swollen. Thought with recent history and mild swelling that possible rue doppler would be beneficial. End of the day. Called charge nurse and notified of recent clinical presentation. Sent patient downstairs for further work up.Pt agreed she would go.

## 2014-05-08 NOTE — ED Notes (Signed)
Pt returned from US

## 2014-05-08 NOTE — ED Notes (Signed)
PA at bedside.

## 2014-05-08 NOTE — Progress Notes (Signed)
Subjective:    Patient ID: Leah [name], female    DOB: 1946-06-07, 68 y.o.   MRN: [id number]  HPI  Pt in feeling tired and worn out. Symptoms started yesterday. Muscles ache and knotting up. Pain between her scapula. Describes cramping sensation. Pt rt  arm hurts and  Rt arm feels swollen.(this is side that blood was drawn on tuesday.) Pt is a smoker. Pt has some pain in her upper back/shoulder blades.No abdominal pain. No nausea, no vomiting. No fevers or chills.No urinary tract infection symptoms. No sob or wheezing. Pt is on simvistatin and recent increase in dose just recently.  Past Medical History  Diagnosis Date  . Esophageal spasm   . GERD (gastroesophageal reflux disease)   . Chronic gastritis   . Diverticulosis   . B12 deficiency   . IBS (irritable bowel syndrome)   . Anxiety   . Depression   . Hiatal hernia   . Iron deficiency anemia   . Hyperlipidemia   . Diverticulitis   . Barrett esophagus   . Rosacea   . Interstitial cystitis     History   Social History  . Marital Status: Married    Spouse Name: N/A    Number of Children: N/A  . Years of Education: N/A   Occupational History  . Not on file.   Social History Main Topics  . Smoking status: Current Every Day Smoker -- 0.50 packs/day for 45 years    Types: Cigarettes  . Smokeless tobacco: Never Used  . Alcohol Use: No  . Drug Use: No  . Sexual Activity: Not on file   Other Topics Concern  . Not on file   Social History Narrative  . No narrative on file    Past Surgical History  Procedure Laterality Date  . Tonsillectomy and adenoidectomy    . Tmj arthroplasty    . Vocal cord polypectomy    . Carpal tunnel release  1993    right  . Abdominal hysterectomy    . Cholecystectomy    . Partial colectomy    . Sinus surgery with instatrak    . Laparoscopic appendectomy N/A 09/21/2013    Procedure: APPENDECTOMY LAPAROSCOPIC;  Surgeon: Rolm Bookbinder, MD;  Location: [facility] OR;  Service:  General;  Laterality: N/A;    Family History  Problem Relation Age of Onset  . Colon cancer Neg Hx   . Pancreatic cancer Mother   . Pancreatic cancer Father   . Colon polyps Mother   . Diabetes Father   . Heart disease Father   . Leukemia Father   . Prostate cancer Father   . Hypertension Father     Allergies  Allergen Reactions  . Amoxicillin-Pot Clavulanate Shortness Of Breath, Itching and Swelling  . Ciprofloxacin Shortness Of Breath, Itching and Swelling  . Moxifloxacin Shortness Of Breath, Itching and Swelling  . Penicillins Shortness Of Breath, Itching and Swelling  . Prednisone Other (See Comments)    GI irritation  . Sulfonamide Derivatives Shortness Of Breath, Itching and Swelling    Current Outpatient Prescriptions on File Prior to Visit  Medication Sig Dispense Refill  . acetaminophen (TYLENOL) 500 MG tablet Take 500 mg by mouth every 6 (six) hours as needed.        Marland Kitchen albuterol (PROAIR HFA) 108 (90 BASE) MCG/ACT inhaler Inhale 2 puffs into the lungs every 6 (six) hours as needed for wheezing.  1 Inhaler  0  . beclomethasone (QVAR) 80 MCG/ACT inhaler Inhale 2 puffs  into the lungs 2 (two) times daily.  1 Inhaler  12  . benzonatate (TESSALON PERLES) 100 MG capsule Take 1 capsule (100 mg total) by mouth 2 (two) times daily as needed for cough.  20 capsule  0  . dicyclomine (BENTYL) 10 MG capsule Take 1 capsule by mouth three times daily as needed for cramping and spasms.  30 capsule  0  . esomeprazole (NEXIUM) 40 MG capsule Take 40 mg by mouth 2 (two) times daily before a meal.      . estradiol (VIVELLE-DOT) 0.1 MG/24HR Place 1 patch onto the skin 2 (two) times a week.        Marland Kitchen LORazepam (ATIVAN) 0.5 MG tablet Take 0.5 mg by mouth at bedtime. At bedtime       . losartan (COZAAR) 50 MG tablet Take 1 tablet (50 mg total) by mouth daily.  30 tablet  0  . metroNIDAZOLE (FLAGYL) 250 MG tablet Take 1 tablet (250 mg total) by mouth 3 (three) times daily.  30 tablet  0  .  nystatin (MYCOSTATIN) 100000 UNIT/ML suspension Take 5 mLs (500,000 Units total) by mouth 4 (four) times daily. Swish and spit x1 week  180 mL  0  . promethazine-dextromethorphan (PROMETHAZINE-DM) 6.25-15 MG/5ML syrup Take 5 mLs by mouth 4 (four) times daily as needed.  240 mL  0  . sertraline (ZOLOFT) 50 MG tablet Take 50 mg by mouth at bedtime.      . simvastatin (ZOCOR) 40 MG tablet Take 1 tablet (40 mg total) by mouth at bedtime.  90 tablet  3  . lisinopril (PRINIVIL,ZESTRIL) 5 MG tablet Take 1 tablet (5 mg total) by mouth daily.  30 tablet  0   No current facility-administered medications on file prior to visit.    BP 145/78  Pulse 87  Temp(Src) 98.8 F (37.1 C) (Oral)  Ht 5' 2.4" (1.585 m)  Wt 118 lb (53.524 kg)  BMI 21.31 kg/m2  SpO2 94%  .   Review of Systems  Constitutional: Positive for fatigue. Negative for fever, chills and diaphoresis.  Respiratory: Negative for cough, chest tightness, shortness of breath and wheezing.   Cardiovascular: Negative for chest pain and palpitations.  Gastrointestinal: Negative.   Genitourinary: Negative.        No dark, black or tarry stools.  Musculoskeletal: Positive for back pain and myalgias.       Rt arm elbow region pain and distal bicep pain. Pt feels that arm is swollen.  Skin: Negative for color change, pallor and rash.  Neurological: Positive for weakness. Negative for dizziness, tremors, seizures, syncope, facial asymmetry, speech difficulty, light-headedness, numbness and headaches.  Hematological: Negative for adenopathy. Does not bruise/bleed easily.  Psychiatric/Behavioral: Negative.        Objective:   Physical Exam  Constitutional: She is oriented to person, place, and time. She appears well-developed and well-nourished. No distress.  Looks mild fatigued.  HENT:  Head: Normocephalic and atraumatic.  Right Ear: External ear normal.  Left Ear: External ear normal.  Eyes: Conjunctivae and EOM are normal. Pupils are  equal, round, and reactive to light. Right eye exhibits no discharge. Left eye exhibits no discharge.  Neck: Normal range of motion. Neck supple. No JVD present. No tracheal deviation present. No thyromegaly present.  Cardiovascular: Normal rate, regular rhythm and normal heart sounds.  Exam reveals no gallop and no friction rub.   No murmur heard. Pulmonary/Chest: Effort normal and breath sounds normal. No stridor. No respiratory distress. She has  no wheezes. She has no rales. She exhibits no tenderness.  Abdominal: Soft. Bowel sounds are normal. She exhibits no distension and no mass. There is no tenderness. There is no rebound and no guarding.  Musculoskeletal:  Pain on palpation of upper back between her scapula.  Rt arm- pulses intact. Rt medial antecubital area mild swollen. Distal bicep mild swollen  And tender. No bruit heard on ausculation. Mild tender to palpation.  Lymphadenopathy:    She has no cervical adenopathy.  Neurological: She is alert and oriented to person, place, and time. No cranial nerve deficit. Coordination normal.  CN III-XII grossly intact.Negative romberg.  Skin: Skin is warm and dry. No rash noted. She is not diaphoretic. No erythema. No pallor.  Psychiatric: She has a normal mood and affect. Her behavior is normal. Judgment and thought content normal.           Assessment & Plan:

## 2014-05-12 ENCOUNTER — Encounter: Payer: Self-pay | Admitting: Physician Assistant

## 2014-05-12 ENCOUNTER — Ambulatory Visit (INDEPENDENT_AMBULATORY_CARE_PROVIDER_SITE_OTHER): Payer: BC Managed Care – PPO | Admitting: Physician Assistant

## 2014-05-12 VITALS — BP 124/67 | HR 79 | Temp 98.4°F | Resp 16 | Ht 61.0 in | Wt 119.2 lb

## 2014-05-12 DIAGNOSIS — M25521 Pain in right elbow: Secondary | ICD-10-CM

## 2014-05-12 DIAGNOSIS — M79621 Pain in right upper arm: Secondary | ICD-10-CM

## 2014-05-12 MED ORDER — METHOCARBAMOL 500 MG PO TABS: 500.0000 mg | ORAL_TABLET | Freq: Three times a day (TID) | ORAL | Status: DC

## 2014-05-12 NOTE — Progress Notes (Signed)
Pre visit review using our clinic review tool, if applicable. No additional management support is needed unless otherwise documented below in the visit note/SLS  

## 2014-05-12 NOTE — Progress Notes (Signed)
Patient presents to clinic today c/o continued R arm pain.  Patient sent to ER on 05/08/14 for more acute workup to r/o thrombosis in vessel of R arm.  US unremarkable for acute thrombosis.  Patient was diagnosed and treated for phlebitis.  Patient taking antibiotic as directed but endorses continued pain.  States is has improved some.  Pain is worsened with ROM although ROM is not impaired.  Denies numbness, tingling or weakness of RUE. Denies fever, chills or malaise.  Past Medical History  Diagnosis Date  . Esophageal spasm   . GERD (gastroesophageal reflux disease)   . Chronic gastritis   . Diverticulosis   . B12 deficiency   . IBS (irritable bowel syndrome)   . Anxiety   . Depression   . Hiatal hernia   . Iron deficiency anemia   . Hyperlipidemia   . Diverticulitis   . Barrett esophagus   . Rosacea   . Interstitial cystitis     Current Outpatient Prescriptions on File Prior to Visit  Medication Sig Dispense Refill  . acetaminophen (TYLENOL) 500 MG tablet Take 500 mg by mouth every 6 (six) hours as needed.        Marland Kitchen albuterol (PROAIR HFA) 108 (90 BASE) MCG/ACT inhaler Inhale 2 puffs into the lungs every 6 (six) hours as needed for wheezing.  1 Inhaler  0  . beclomethasone (QVAR) 80 MCG/ACT inhaler Inhale 2 puffs into the lungs 2 (two) times daily.  1 Inhaler  12  . dicyclomine (BENTYL) 10 MG capsule Take 1 capsule by mouth three times daily as needed for cramping and spasms.  30 capsule  0  . doxycycline (VIBRAMYCIN) 100 MG capsule Take 1 capsule (100 mg total) by mouth 2 (two) times daily.  20 capsule  0  . esomeprazole (NEXIUM) 40 MG capsule Take 40 mg by mouth 2 (two) times daily before a meal.      . estradiol (VIVELLE-DOT) 0.1 MG/24HR Place 1 patch onto the skin 2 (two) times a week.        Marland Kitchen ibuprofen (ADVIL,MOTRIN) 800 MG tablet Take 1 tablet (800 mg total) by mouth 3 (three) times daily.  21 tablet  0  . lisinopril (PRINIVIL,ZESTRIL) 5 MG tablet Take 1 tablet (5 mg  total) by mouth daily.  30 tablet  0  . LORazepam (ATIVAN) 0.5 MG tablet Take 0.5 mg by mouth at bedtime. At bedtime       . sertraline (ZOLOFT) 50 MG tablet Take 50 mg by mouth at bedtime.      . simvastatin (ZOCOR) 40 MG tablet Take 1 tablet (40 mg total) by mouth at bedtime.  90 tablet  3   No current facility-administered medications on file prior to visit.    Allergies  Allergen Reactions  . Amoxicillin-Pot Clavulanate Shortness Of Breath, Itching and Swelling  . Ciprofloxacin Shortness Of Breath, Itching and Swelling  . Moxifloxacin Shortness Of Breath, Itching and Swelling  . Penicillins Shortness Of Breath, Itching and Swelling  . Prednisone Other (See Comments)    GI irritation  . Sulfonamide Derivatives Shortness Of Breath, Itching and Swelling    Family History  Problem Relation Age of Onset  . Colon cancer Neg Hx   . Pancreatic cancer Mother   . Pancreatic cancer Father   . Colon polyps Mother   . Diabetes Father   . Heart disease Father   . Leukemia Father   . Prostate cancer Father   . Hypertension Father  History   Social History  . Marital Status: Married    Spouse Name: N/A    Number of Children: N/A  . Years of Education: N/A   Social History Main Topics  . Smoking status: Current Every Day Smoker -- 0.50 packs/day for 45 years    Types: Cigarettes  . Smokeless tobacco: Never Used  . Alcohol Use: No  . Drug Use: No  . Sexual Activity: None   Other Topics Concern  . None   Social History Narrative  . None    Review of Systems - See HPI.  All other ROS are negative.  BP 124/67  Pulse 79  Temp(Src) 98.4 F (36.9 C) (Oral)  Resp 16  Ht 5\' 1"  (1.549 m)  Wt 119 lb 4 oz (54.091 kg)  BMI 22.54 kg/m2  SpO2 98%  Physical Exam  Vitals reviewed. Constitutional: She is oriented to person, place, and time and well-developed, well-nourished, and in no distress.  HENT:  Head: Normocephalic.  Eyes: Conjunctivae are normal.  Cardiovascular:  Normal rate, regular rhythm, normal heart sounds and intact distal pulses.   Pulmonary/Chest: Effort normal and breath sounds normal. No respiratory distress. She has no wheezes. She has no rales. She exhibits no tenderness.  Musculoskeletal:       Arms: Neurological: She is alert and oriented to person, place, and time.  Skin: Skin is warm and dry. No rash noted.  Psychiatric: Affect normal.    Recent Results (from the past 2160 hour(s))  CBC WITH DIFFERENTIAL     Status: None   Collection Time    03/02/14  2:06 PM      Result Value Ref Range   WBC 7.6  4.0 - 10.5 K/uL   RBC 4.86  3.87 - 5.11 Mil/uL   Hemoglobin 14.7  12.0 - 15.0 g/dL   HCT 43.5  36.0 - 46.0 %   MCV 89.5  78.0 - 100.0 fl   MCHC 33.8  30.0 - 36.0 g/dL   RDW 15.5  11.5 - 15.5 %   Platelets 256.0  150.0 - 400.0 K/uL   Neutrophils Relative % 66.2  43.0 - 77.0 %   Lymphocytes Relative 25.2  12.0 - 46.0 %   Monocytes Relative 6.4  3.0 - 12.0 %   Eosinophils Relative 1.7  0.0 - 5.0 %   Basophils Relative 0.5  0.0 - 3.0 %   Neutro Abs 5.0  1.4 - 7.7 K/uL   Lymphs Abs 1.9  0.7 - 4.0 K/uL   Monocytes Absolute 0.5  0.1 - 1.0 K/uL   Eosinophils Absolute 0.1  0.0 - 0.7 K/uL   Basophils Absolute 0.0  0.0 - 0.1 K/uL  COMPREHENSIVE METABOLIC PANEL     Status: Abnormal   Collection Time    03/02/14  2:06 PM      Result Value Ref Range   Sodium 136  135 - 145 mEq/L   Potassium 4.4  3.5 - 5.1 mEq/L   Chloride 99  96 - 112 mEq/L   CO2 32  19 - 32 mEq/L   Glucose, Bld 80  70 - 99 mg/dL   BUN 14  6 - 23 mg/dL   Creatinine, Ser 1.1  0.4 - 1.2 mg/dL   Total Bilirubin 0.5  0.2 - 1.2 mg/dL   Alkaline Phosphatase 54  39 - 117 U/L   AST 15  0 - 37 U/L   ALT 12  0 - 35 U/L   Total Protein 6.9  6.0 -  8.3 g/dL   Albumin 3.9  3.5 - 5.2 g/dL   Calcium 9.0  8.4 - 10.5 mg/dL   GFR 55.17 (*) >60.00 mL/min  COMPREHENSIVE METABOLIC PANEL     Status: None   Collection Time    05/05/14 12:42 PM      Result Value Ref Range   Sodium  138  135 - 145 mEq/L   Potassium 4.1  3.5 - 5.1 mEq/L   Chloride 102  96 - 112 mEq/L   CO2 29  19 - 32 mEq/L   Glucose, Bld 84  70 - 99 mg/dL   BUN 11  6 - 23 mg/dL   Creatinine, Ser 0.7  0.4 - 1.2 mg/dL   Total Bilirubin 0.4  0.2 - 1.2 mg/dL   Alkaline Phosphatase 64  39 - 117 U/L   AST 16  0 - 37 U/L   ALT 14  0 - 35 U/L   Total Protein 8.0  6.0 - 8.3 g/dL   Albumin 4.4  3.5 - 5.2 g/dL   Calcium 9.2  8.4 - 10.5 mg/dL   GFR 90.49  >60.00 mL/min  LIPID PANEL     Status: Abnormal   Collection Time    05/05/14 12:42 PM      Result Value Ref Range   Cholesterol 289 (*) 0 - 200 mg/dL   Comment: ATP III Classification       Desirable:  < 200 mg/dL               Borderline High:  200 - 239 mg/dL          High:  > = 240 mg/dL   Triglycerides 159.0 (*) 0.0 - 149.0 mg/dL   Comment: Normal:  <150 mg/dLBorderline High:  150 - 199 mg/dL   HDL 36.90 (*) >39.00 mg/dL   VLDL 31.8  0.0 - 40.0 mg/dL   LDL Cholesterol 220 (*) 0 - 99 mg/dL   Total CHOL/HDL Ratio 8     Comment:                Men          Women1/2 Average Risk     3.4          3.3Average Risk          5.0          4.42X Average Risk          9.6          7.13X Average Risk          15.0          11.0                       NonHDL 252.10     Comment: NOTE:  Non-HDL goal should be 30 mg/dL higher than patient's LDL goal (i.e. LDL goal of < 70 mg/dL, would have non-HDL goal of < 100 mg/dL)    Assessment/Plan: Pain in joint, upper arm No evidence on phlebitis on examination, however patient encouraged to finish course of antibiotic as I did not evaluate her at time of initial presentation. Ice to arm.  Robaxin for muscle tension.  Elevate extremity. If symptoms persist, will need input from Sports Medicine specialist.

## 2014-05-12 NOTE — Patient Instructions (Signed)
Please continue antibiotic as directed until all tablets are gone.  Ice and elevation will be beneficial.  Take Robaxin as directed for muscle tension/spasm. If symptoms persist, we will need to set you up with a specialist for further evaluation.

## 2014-05-13 ENCOUNTER — Telehealth: Payer: Self-pay | Admitting: Family Medicine

## 2014-05-13 NOTE — Telephone Encounter (Signed)
[facility] MAILED  °

## 2014-05-17 NOTE — Assessment & Plan Note (Signed)
No evidence on phlebitis on examination, however patient encouraged to finish course of antibiotic as I did not evaluate her at time of initial presentation. Ice to arm.  Robaxin for muscle tension.  Elevate extremity. If symptoms persist, will need input from Sports Medicine specialist.

## 2014-05-19 ENCOUNTER — Ambulatory Visit (HOSPITAL_BASED_OUTPATIENT_CLINIC_OR_DEPARTMENT_OTHER)
Admission: RE | Admit: 2014-05-19 | Discharge: 2014-05-19 | Disposition: A | Discharge status: Home / Self Care | Payer: BC Managed Care – PPO | Source: Ambulatory Visit | Attending: Physician Assistant | Admitting: Physician Assistant

## 2014-05-19 ENCOUNTER — Ambulatory Visit (INDEPENDENT_AMBULATORY_CARE_PROVIDER_SITE_OTHER): Payer: BC Managed Care – PPO | Admitting: Physician Assistant

## 2014-05-19 ENCOUNTER — Encounter: Payer: Self-pay | Admitting: Physician Assistant

## 2014-05-19 VITALS — BP 152/72 | HR 64 | Temp 98.2°F | Resp 16 | Ht 61.0 in | Wt 120.2 lb

## 2014-05-19 DIAGNOSIS — M79601 Pain in right arm: Secondary | ICD-10-CM | POA: Diagnosis not present

## 2014-05-19 DIAGNOSIS — M542 Cervicalgia: Secondary | ICD-10-CM

## 2014-05-19 MED ORDER — TRAMADOL HCL 50 MG PO TABS: 50.0000 mg | ORAL_TABLET | Freq: Three times a day (TID) | ORAL | Status: DC | PRN

## 2014-05-19 MED ORDER — CYCLOBENZAPRINE HCL 10 MG PO TABS: 10.0000 mg | ORAL_TABLET | Freq: Three times a day (TID) | ORAL | Status: DC | PRN

## 2014-05-19 NOTE — Assessment & Plan Note (Signed)
Will obtain x-ray cervical spine an R shoulder due to symptoms. DC Robaxin.  Rx Flexeril.  Recommended referral to Sports Medicine but patinet declines at present.

## 2014-05-19 NOTE — Progress Notes (Signed)
Pre visit review using our clinic review tool, if applicable. No additional management support is needed unless otherwise documented below in the visit note/SLS  

## 2014-05-19 NOTE — Patient Instructions (Signed)
Please go downstairs for imaging. I will call you with your results.  Stop by front desk and speak to [facility] about scheduling an appointment with Dr. Barbaraann Barthel.  Take Flexeril and Tramadol as directed.  Avoid heavy lifting or overexertion. I will call you once your FMLA paperwork has been completed.

## 2014-05-19 NOTE — Progress Notes (Signed)
Patient presents to clinic today c/o continued pain in R arm and neck despite treatment with muscle relaxants.  Patient evaluated last week for continued symptoms.  Workup thus far, including [place] was unremarkble.  Patient treated by ER for phlebitis. No change in symptoms.  Last exam unremarkable for findings consistent with phlebitis.  Denies new symptoms.   Past Medical History  Diagnosis Date  . Esophageal spasm   . GERD (gastroesophageal reflux disease)   . Chronic gastritis   . Diverticulosis   . B12 deficiency   . IBS (irritable bowel syndrome)   . Anxiety   . Depression   . Hiatal hernia   . Iron deficiency anemia   . Hyperlipidemia   . Diverticulitis   . Barrett esophagus   . Rosacea   . Interstitial cystitis     Current Outpatient Prescriptions on File Prior to Visit  Medication Sig Dispense Refill  . acetaminophen (TYLENOL) 500 MG tablet Take 500 mg by mouth every 6 (six) hours as needed.        Marland Kitchen albuterol (PROAIR HFA) 108 (90 BASE) MCG/ACT inhaler Inhale 2 puffs into the lungs every 6 (six) hours as needed for wheezing.  1 Inhaler  0  . beclomethasone (QVAR) 80 MCG/ACT inhaler Inhale 2 puffs into the lungs 2 (two) times daily.  1 Inhaler  12  . dicyclomine (BENTYL) 10 MG capsule Take 1 capsule by mouth three times daily as needed for cramping and spasms.  30 capsule  0  . esomeprazole (NEXIUM) 40 MG capsule Take 40 mg by mouth 2 (two) times daily before a meal.      . estradiol (VIVELLE-DOT) 0.1 MG/24HR Place 1 patch onto the skin 2 (two) times a week.        Marland Kitchen ibuprofen (ADVIL,MOTRIN) 800 MG tablet Take 1 tablet (800 mg total) by mouth 3 (three) times daily.  21 tablet  0  . lisinopril (PRINIVIL,ZESTRIL) 5 MG tablet Take 1 tablet (5 mg total) by mouth daily.  30 tablet  0  . LORazepam (ATIVAN) 0.5 MG tablet Take 0.5 mg by mouth at bedtime. At bedtime       . sertraline (ZOLOFT) 50 MG tablet Take 50 mg by mouth at bedtime.      . simvastatin (ZOCOR) 40 MG tablet  Take 1 tablet (40 mg total) by mouth at bedtime.  90 tablet  3   No current facility-administered medications on file prior to visit.    Allergies  Allergen Reactions  . Amoxicillin-Pot Clavulanate Shortness Of Breath, Itching and Swelling  . Ciprofloxacin Shortness Of Breath, Itching and Swelling  . Moxifloxacin Shortness Of Breath, Itching and Swelling  . Penicillins Shortness Of Breath, Itching and Swelling  . Prednisone Other (See Comments)    GI irritation  . Sulfonamide Derivatives Shortness Of Breath, Itching and Swelling    Family History  Problem Relation Age of Onset  . Colon cancer Neg Hx   . Pancreatic cancer Mother   . Pancreatic cancer Father   . Colon polyps Mother   . Diabetes Father   . Heart disease Father   . Leukemia Father   . Prostate cancer Father   . Hypertension Father     History   Social History  . Marital Status: Married    Spouse Name: N/A    Number of Children: N/A  . Years of Education: N/A   Social History Main Topics  . Smoking status: Current Every Day Smoker -- 0.50 packs/day  for 45 years    Types: Cigarettes  . Smokeless tobacco: Never Used  . Alcohol Use: No  . Drug Use: No  . Sexual Activity: None   Other Topics Concern  . None   Social History Narrative  . None    Review of Systems - See HPI.  All other ROS are negative.  BP 152/72  Pulse 64  Temp(Src) 98.2 F (36.8 C) (Oral)  Resp 16  Ht 5\' 1"  (1.549 m)  Wt 120 lb 4 oz (54.545 kg)  BMI 22.73 kg/m2  SpO2 99%  Physical Exam  Vitals reviewed. Constitutional: She is oriented to person, place, and time and well-developed, well-nourished, and in no distress.  HENT:  Head: Normocephalic and atraumatic.  Eyes: Conjunctivae are normal. Pupils are equal, round, and reactive to light.  Neck: Normal range of motion. Neck supple.  Cardiovascular: Normal rate, regular rhythm, normal heart sounds and intact distal pulses.   Pulmonary/Chest: Effort normal and breath  sounds normal. No respiratory distress. She has no wheezes. She has no rales. She exhibits no tenderness.  Musculoskeletal:       Right shoulder: Normal.       Cervical back: She exhibits tenderness, pain and spasm. She exhibits normal range of motion and no bony tenderness.       Right upper arm: She exhibits tenderness. She exhibits no bony tenderness, no swelling, no edema, no deformity and no laceration.  Neurological: She is alert and oriented to person, place, and time.  Skin: Skin is warm and dry. No rash noted.  Psychiatric: Affect normal.   Recent Results (from the past 2160 hour(s))  CBC WITH DIFFERENTIAL     Status: None   Collection Time    03/02/14  2:06 PM      Result Value Ref Range   WBC 7.6  4.0 - 10.5 K/uL   RBC 4.86  3.87 - 5.11 Mil/uL   Hemoglobin 14.7  12.0 - 15.0 g/dL   HCT 43.5  36.0 - 46.0 %   MCV 89.5  78.0 - 100.0 fl   MCHC 33.8  30.0 - 36.0 g/dL   RDW 15.5  11.5 - 15.5 %   Platelets 256.0  150.0 - 400.0 K/uL   Neutrophils Relative % 66.2  43.0 - 77.0 %   Lymphocytes Relative 25.2  12.0 - 46.0 %   Monocytes Relative 6.4  3.0 - 12.0 %   Eosinophils Relative 1.7  0.0 - 5.0 %   Basophils Relative 0.5  0.0 - 3.0 %   Neutro Abs 5.0  1.4 - 7.7 K/uL   Lymphs Abs 1.9  0.7 - 4.0 K/uL   Monocytes Absolute 0.5  0.1 - 1.0 K/uL   Eosinophils Absolute 0.1  0.0 - 0.7 K/uL   Basophils Absolute 0.0  0.0 - 0.1 K/uL  COMPREHENSIVE METABOLIC PANEL     Status: Abnormal   Collection Time    03/02/14  2:06 PM      Result Value Ref Range   Sodium 136  135 - 145 mEq/L   Potassium 4.4  3.5 - 5.1 mEq/L   Chloride 99  96 - 112 mEq/L   CO2 32  19 - 32 mEq/L   Glucose, Bld 80  70 - 99 mg/dL   BUN 14  6 - 23 mg/dL   Creatinine, Ser 1.1  0.4 - 1.2 mg/dL   Total Bilirubin 0.5  0.2 - 1.2 mg/dL   Alkaline Phosphatase 54  39 - 117 U/L  AST 15  0 - 37 U/L   ALT 12  0 - 35 U/L   Total Protein 6.9  6.0 - 8.3 g/dL   Albumin 3.9  3.5 - 5.2 g/dL   Calcium 9.0  8.4 - 10.5 mg/dL    GFR 55.17 (*) >60.00 mL/min  COMPREHENSIVE METABOLIC PANEL     Status: None   Collection Time    05/05/14 12:42 PM      Result Value Ref Range   Sodium 138  135 - 145 mEq/L   Potassium 4.1  3.5 - 5.1 mEq/L   Chloride 102  96 - 112 mEq/L   CO2 29  19 - 32 mEq/L   Glucose, Bld 84  70 - 99 mg/dL   BUN 11  6 - 23 mg/dL   Creatinine, Ser 0.7  0.4 - 1.2 mg/dL   Total Bilirubin 0.4  0.2 - 1.2 mg/dL   Alkaline Phosphatase 64  39 - 117 U/L   AST 16  0 - 37 U/L   ALT 14  0 - 35 U/L   Total Protein 8.0  6.0 - 8.3 g/dL   Albumin 4.4  3.5 - 5.2 g/dL   Calcium 9.2  8.4 - 10.5 mg/dL   GFR 90.49  >60.00 mL/min  LIPID PANEL     Status: Abnormal   Collection Time    05/05/14 12:42 PM      Result Value Ref Range   Cholesterol 289 (*) 0 - 200 mg/dL   Comment: ATP III Classification       Desirable:  < 200 mg/dL               Borderline High:  200 - 239 mg/dL          High:  > = 240 mg/dL   Triglycerides 159.0 (*) 0.0 - 149.0 mg/dL   Comment: Normal:  <150 mg/dLBorderline High:  150 - 199 mg/dL   HDL 36.90 (*) >39.00 mg/dL   VLDL 31.8  0.0 - 40.0 mg/dL   LDL Cholesterol 220 (*) 0 - 99 mg/dL   Total CHOL/HDL Ratio 8     Comment:                Men          Women1/2 Average Risk     3.4          3.3Average Risk          5.0          4.42X Average Risk          9.6          7.13X Average Risk          15.0          11.0                       NonHDL 252.10     Comment: NOTE:  Non-HDL goal should be 30 mg/dL higher than patient's LDL goal (i.e. LDL goal of < 70 mg/dL, would have non-HDL goal of < 100 mg/dL)    Assessment/Plan: Cervical pain (neck) Will obtain x-ray cervical spine an R shoulder due to symptoms. DC Robaxin.  Rx Flexeril.  Recommended referral to Sports Medicine but patinet declines at present.

## 2014-05-20 ENCOUNTER — Telehealth: Payer: Self-pay | Admitting: *Deleted

## 2014-05-20 DIAGNOSIS — Z7689 Persons encountering health services in other specified circumstances: Secondary | ICD-10-CM

## 2014-05-20 NOTE — Telephone Encounter (Signed)
Received completed/signed FMLA and disability forms from [place]. Called and left detailed message for patient informing her that forms have been faxed to [phone] and her copy was placed up front for pick up.  [employer] sent for scanning. [id number]

## 2014-05-21 ENCOUNTER — Ambulatory Visit (INDEPENDENT_AMBULATORY_CARE_PROVIDER_SITE_OTHER): Payer: BC Managed Care – PPO | Admitting: Family Medicine

## 2014-05-21 ENCOUNTER — Encounter: Payer: Self-pay | Admitting: Family Medicine

## 2014-05-21 VITALS — BP 139/91 | HR 102 | Ht 62.0 in | Wt 119.0 lb

## 2014-05-21 DIAGNOSIS — M542 Cervicalgia: Secondary | ICD-10-CM

## 2014-05-21 NOTE — Patient Instructions (Addendum)
You have an irritated nerve in your neck related to the arthritis, bone spur in that area. You can't take prednisone or an anti-inflammatory. Flexeril three times a day as needed for muscle spasms (can make you sleepy - if so do not drive while taking this). Tylenol and/or tramadol as needed for severe pain. Consider cervical collar if severely painful. Simple range of motion exercises within limits of pain to prevent further stiffness. Start physical therapy for stretching, exercises, traction, and modalities. Heat 15 minutes at a time 3-4 times a day to help with spasms. Watch head position when on computers, texting, when sleeping in bed - should in line with back to prevent further nerve traction and irritation. Consider home traction unit if you get benefit with this in physical therapy. If not improving we will consider an MRI. Follow up with me in 1 month to 6 weeks.

## 2014-05-25 ENCOUNTER — Ambulatory Visit: Payer: BC Managed Care – PPO | Attending: Family Medicine | Admitting: Physical Therapy

## 2014-05-25 DIAGNOSIS — Z5189 Encounter for other specified aftercare: Secondary | ICD-10-CM | POA: Diagnosis present

## 2014-05-25 DIAGNOSIS — M542 Cervicalgia: Secondary | ICD-10-CM | POA: Diagnosis not present

## 2014-05-25 DIAGNOSIS — R293 Abnormal posture: Secondary | ICD-10-CM | POA: Diagnosis not present

## 2014-05-25 DIAGNOSIS — I10 Essential (primary) hypertension: Secondary | ICD-10-CM | POA: Diagnosis not present

## 2014-05-25 DIAGNOSIS — M6281 Muscle weakness (generalized): Secondary | ICD-10-CM | POA: Diagnosis not present

## 2014-05-26 ENCOUNTER — Encounter: Payer: Self-pay | Admitting: Family Medicine

## 2014-05-26 NOTE — Assessment & Plan Note (Signed)
consistent with degenerative disc disease with radiculopathy.  Unfortunately she cannot take prednisone or nsaids.  Will take flexeril with tylenol, tramadol as needed.  Start physical therapy and home exercises.  Discussed ergonomic issues.  Heat as needed.  Consider MRI if not improving over 1 month to 6 weeks.

## 2014-05-26 NOTE — Progress Notes (Signed)
Patient ID: Leah [name], female   DOB: 07-10-1946, 68 y.o.   MRN: [id number]  PCP: Garnet Koyanagi, DO Referred by: Elyn Aquas [facility]  Subjective:   HPI: Patient is a 68 y.o. female here for neck pain.  Patient reports pain in right arm started just over 2 weeks ago. Had a blood draw in antecubital fossa and told they irritated a nerve or muscle when they did this. Reports having neck pain at the same time with burning down into her elbows. Pain in arm improved. Tried heat/ice, muscle relaxant.   Given antibiotics for phlebitis in arm.  Tramadol for pain. Has history of peptic ulcer disease so not taking nsaids. She had a doppler ultrasound of right upper extremity that was negative for DVT. C-spine radiographs showed spurs at C5-6, C6-7. No bowel/bladder dysfunction.  Past Medical History  Diagnosis Date  . Esophageal spasm   . GERD (gastroesophageal reflux disease)   . Chronic gastritis   . Diverticulosis   . B12 deficiency   . IBS (irritable bowel syndrome)   . Anxiety   . Depression   . Hiatal hernia   . Iron deficiency anemia   . Hyperlipidemia   . Diverticulitis   . Barrett esophagus   . Rosacea   . Interstitial cystitis     Current Outpatient Prescriptions on File Prior to Visit  Medication Sig Dispense Refill  . acetaminophen (TYLENOL) 500 MG tablet Take 500 mg by mouth every 6 (six) hours as needed.        Marland Kitchen albuterol (PROAIR HFA) 108 (90 BASE) MCG/ACT inhaler Inhale 2 puffs into the lungs every 6 (six) hours as needed for wheezing.  1 Inhaler  0  . beclomethasone (QVAR) 80 MCG/ACT inhaler Inhale 2 puffs into the lungs 2 (two) times daily.  1 Inhaler  12  . cyclobenzaprine (FLEXERIL) 10 MG tablet Take 1 tablet (10 mg total) by mouth 3 (three) times daily as needed for muscle spasms.  30 tablet  0  . dicyclomine (BENTYL) 10 MG capsule Take 1 capsule by mouth three times daily as needed for cramping and spasms.  30 capsule  0  . esomeprazole (NEXIUM) 40 MG  capsule Take 40 mg by mouth 2 (two) times daily before a meal.      . estradiol (VIVELLE-DOT) 0.1 MG/24HR Place 1 patch onto the skin 2 (two) times a week.        Marland Kitchen ibuprofen (ADVIL,MOTRIN) 800 MG tablet Take 1 tablet (800 mg total) by mouth 3 (three) times daily.  21 tablet  0  . lisinopril (PRINIVIL,ZESTRIL) 5 MG tablet Take 1 tablet (5 mg total) by mouth daily.  30 tablet  0  . LORazepam (ATIVAN) 0.5 MG tablet Take 0.5 mg by mouth at bedtime. At bedtime       . sertraline (ZOLOFT) 50 MG tablet Take 50 mg by mouth at bedtime.      . simvastatin (ZOCOR) 40 MG tablet Take 1 tablet (40 mg total) by mouth at bedtime.  90 tablet  3  . traMADol (ULTRAM) 50 MG tablet Take 1 tablet (50 mg total) by mouth every 8 (eight) hours as needed.  30 tablet  0   No current facility-administered medications on file prior to visit.    Past Surgical History  Procedure Laterality Date  . Tonsillectomy and adenoidectomy    . Tmj arthroplasty    . Vocal cord polypectomy    . Carpal tunnel release  1993    right  .  Abdominal hysterectomy    . Cholecystectomy    . Partial colectomy    . Sinus surgery with instatrak    . Laparoscopic appendectomy N/A 09/21/2013    Procedure: APPENDECTOMY LAPAROSCOPIC;  Surgeon: Rolm Bookbinder, MD;  Location: [facility] OR;  Service: General;  Laterality: N/A;    Allergies  Allergen Reactions  . Amoxicillin-Pot Clavulanate Shortness Of Breath, Itching and Swelling  . Ciprofloxacin Shortness Of Breath, Itching and Swelling  . Moxifloxacin Shortness Of Breath, Itching and Swelling  . Penicillins Shortness Of Breath, Itching and Swelling  . Prednisone Other (See Comments)    GI irritation  . Sulfonamide Derivatives Shortness Of Breath, Itching and Swelling    History   Social History  . Marital Status: Married    Spouse Name: N/A    Number of Children: N/A  . Years of Education: N/A   Occupational History  . Not on file.   Social History Main Topics  . Smoking  status: Current Every Day Smoker -- 0.50 packs/day for 45 years    Types: Cigarettes  . Smokeless tobacco: Never Used  . Alcohol Use: No  . Drug Use: No  . Sexual Activity: Not on file   Other Topics Concern  . Not on file   Social History Narrative  . No narrative on file    Family History  Problem Relation Age of Onset  . Colon cancer Neg Hx   . Pancreatic cancer Mother   . Pancreatic cancer Father   . Colon polyps Mother   . Diabetes Father   . Heart disease Father   . Leukemia Father   . Prostate cancer Father   . Hypertension Father     BP 139/91  Pulse 102  Ht 5\' 2"  (1.575 m)  Wt 119 lb (53.978 kg)  BMI 21.76 kg/m2  Review of Systems: See HPI above.    Objective:  Physical Exam:  Gen: NAD  Neck: No gross deformity, swelling, bruising. TTP mildly midline and right cervical paraspinal region.  No stepoffs. Only 5 degrees extension, painful.  Otherwise FROM. BUE strength 5/5.   Sensation intact to light touch. 1+ equal reflexes in triceps, biceps, brachioradialis tendons. Negative spurlings. NV intact distal BUEs.    Assessment & Plan:  1. Neck/right arm pain - consistent with degenerative disc disease with radiculopathy.  Unfortunately she cannot take prednisone or nsaids.  Will take flexeril with tylenol, tramadol as needed.  Start physical therapy and home exercises.  Discussed ergonomic issues.  Heat as needed.  Consider MRI if not improving over 1 month to 6 weeks.

## 2014-05-27 ENCOUNTER — Encounter: Payer: Self-pay | Admitting: Physician Assistant

## 2014-05-27 ENCOUNTER — Ambulatory Visit: Payer: BC Managed Care – PPO

## 2014-05-27 ENCOUNTER — Ambulatory Visit (INDEPENDENT_AMBULATORY_CARE_PROVIDER_SITE_OTHER): Payer: BC Managed Care – PPO | Admitting: Physician Assistant

## 2014-05-27 ENCOUNTER — Encounter: Payer: Self-pay | Admitting: *Deleted

## 2014-05-27 VITALS — BP 135/68 | HR 86 | Temp 98.6°F | Resp 16 | Ht 61.0 in | Wt 119.4 lb

## 2014-05-27 DIAGNOSIS — M542 Cervicalgia: Secondary | ICD-10-CM

## 2014-05-27 NOTE — Progress Notes (Signed)
Pre visit review using our clinic review tool, if applicable. No additional management support is needed unless otherwise documented below in the visit note/SLS  

## 2014-05-27 NOTE — Progress Notes (Signed)
Patient presents to clinic today for discussion to possibly extend FMLA paperwork for an additional week, giving pain in right arm and neck are still present.  Patient evaluated by Sports Medicine on 10/22 and was started on Flexeril and Tramadol.  Patient endorses taking as directed. Is seeing PT 1-2 times per week with some improvement in symptoms.  Past Medical History  Diagnosis Date  . Esophageal spasm   . GERD (gastroesophageal reflux disease)   . Chronic gastritis   . Diverticulosis   . B12 deficiency   . IBS (irritable bowel syndrome)   . Anxiety   . Depression   . Hiatal hernia   . Iron deficiency anemia   . Hyperlipidemia   . Diverticulitis   . Barrett esophagus   . Rosacea   . Interstitial cystitis     Current Outpatient Prescriptions on File Prior to Visit  Medication Sig Dispense Refill  . acetaminophen (TYLENOL) 500 MG tablet Take 500 mg by mouth every 6 (six) hours as needed.        Marland Kitchen albuterol (PROAIR HFA) 108 (90 BASE) MCG/ACT inhaler Inhale 2 puffs into the lungs every 6 (six) hours as needed for wheezing.  1 Inhaler  0  . beclomethasone (QVAR) 80 MCG/ACT inhaler Inhale 2 puffs into the lungs 2 (two) times daily.  1 Inhaler  12  . cyclobenzaprine (FLEXERIL) 10 MG tablet Take 1 tablet (10 mg total) by mouth 3 (three) times daily as needed for muscle spasms.  30 tablet  0  . dicyclomine (BENTYL) 10 MG capsule Take 1 capsule by mouth three times daily as needed for cramping and spasms.  30 capsule  0  . esomeprazole (NEXIUM) 40 MG capsule Take 40 mg by mouth 2 (two) times daily before a meal.      . estradiol (VIVELLE-DOT) 0.1 MG/24HR Place 1 patch onto the skin 2 (two) times a week.        Marland Kitchen ibuprofen (ADVIL,MOTRIN) 800 MG tablet Take 1 tablet (800 mg total) by mouth 3 (three) times daily.  21 tablet  0  . lisinopril (PRINIVIL,ZESTRIL) 5 MG tablet Take 1 tablet (5 mg total) by mouth daily.  30 tablet  0  . LORazepam (ATIVAN) 0.5 MG tablet Take 0.5 mg by mouth at  bedtime. At bedtime       . sertraline (ZOLOFT) 50 MG tablet Take 50 mg by mouth at bedtime.      . simvastatin (ZOCOR) 40 MG tablet Take 1 tablet (40 mg total) by mouth at bedtime.  90 tablet  3  . traMADol (ULTRAM) 50 MG tablet Take 1 tablet (50 mg total) by mouth every 8 (eight) hours as needed.  30 tablet  0   No current facility-administered medications on file prior to visit.    Allergies  Allergen Reactions  . Amoxicillin-Pot Clavulanate Shortness Of Breath, Itching and Swelling  . Ciprofloxacin Shortness Of Breath, Itching and Swelling  . Moxifloxacin Shortness Of Breath, Itching and Swelling  . Penicillins Shortness Of Breath, Itching and Swelling  . Prednisone Other (See Comments)    GI irritation  . Sulfonamide Derivatives Shortness Of Breath, Itching and Swelling    Family History  Problem Relation Age of Onset  . Colon cancer Neg Hx   . Pancreatic cancer Mother   . Pancreatic cancer Father   . Colon polyps Mother   . Diabetes Father   . Heart disease Father   . Leukemia Father   . Prostate cancer Father   .  Hypertension Father     History   Social History  . Marital Status: Married    Spouse Name: N/A    Number of Children: N/A  . Years of Education: N/A   Social History Main Topics  . Smoking status: Current Every Day Smoker -- 0.50 packs/day for 45 years    Types: Cigarettes  . Smokeless tobacco: Never Used  . Alcohol Use: No  . Drug Use: No  . Sexual Activity: None   Other Topics Concern  . None   Social History Narrative  . None   Review of Systems - See HPI.  All other ROS are negative.  BP 135/68  Pulse 86  Temp(Src) 98.6 F (37 C) (Oral)  Resp 16  Ht 5\' 1"  (1.549 m)  Wt 119 lb 6 oz (54.148 kg)  BMI 22.57 kg/m2  SpO2 95%  Physical Exam  Vitals reviewed. Constitutional: She is oriented to person, place, and time and well-developed, well-nourished, and in no distress.  HENT:  Head: Normocephalic and atraumatic.  Neck: Normal  range of motion. Neck supple.  Cardiovascular: Normal rate, regular rhythm, normal heart sounds and intact distal pulses.   Pulmonary/Chest: Effort normal and breath sounds normal. No respiratory distress. She has no wheezes. She has no rales. She exhibits no tenderness.  Musculoskeletal:       Cervical back: She exhibits tenderness and spasm. She exhibits no bony tenderness.  Neurological: She is alert and oriented to person, place, and time.  Skin: Skin is warm and dry.  Psychiatric: Affect normal.    Recent Results (from the past 2160 hour(s))  CBC WITH DIFFERENTIAL     Status: None   Collection Time    03/02/14  2:06 PM      Result Value Ref Range   WBC 7.6  4.0 - 10.5 K/uL   RBC 4.86  3.87 - 5.11 Mil/uL   Hemoglobin 14.7  12.0 - 15.0 g/dL   HCT 43.5  36.0 - 46.0 %   MCV 89.5  78.0 - 100.0 fl   MCHC 33.8  30.0 - 36.0 g/dL   RDW 15.5  11.5 - 15.5 %   Platelets 256.0  150.0 - 400.0 K/uL   Neutrophils Relative % 66.2  43.0 - 77.0 %   Lymphocytes Relative 25.2  12.0 - 46.0 %   Monocytes Relative 6.4  3.0 - 12.0 %   Eosinophils Relative 1.7  0.0 - 5.0 %   Basophils Relative 0.5  0.0 - 3.0 %   Neutro Abs 5.0  1.4 - 7.7 K/uL   Lymphs Abs 1.9  0.7 - 4.0 K/uL   Monocytes Absolute 0.5  0.1 - 1.0 K/uL   Eosinophils Absolute 0.1  0.0 - 0.7 K/uL   Basophils Absolute 0.0  0.0 - 0.1 K/uL  COMPREHENSIVE METABOLIC PANEL     Status: Abnormal   Collection Time    03/02/14  2:06 PM      Result Value Ref Range   Sodium 136  135 - 145 mEq/L   Potassium 4.4  3.5 - 5.1 mEq/L   Chloride 99  96 - 112 mEq/L   CO2 32  19 - 32 mEq/L   Glucose, Bld 80  70 - 99 mg/dL   BUN 14  6 - 23 mg/dL   Creatinine, Ser 1.1  0.4 - 1.2 mg/dL   Total Bilirubin 0.5  0.2 - 1.2 mg/dL   Alkaline Phosphatase 54  39 - 117 U/L   AST 15  0 -  37 U/L   ALT 12  0 - 35 U/L   Total Protein 6.9  6.0 - 8.3 g/dL   Albumin 3.9  3.5 - 5.2 g/dL   Calcium 9.0  8.4 - 10.5 mg/dL   GFR 55.17 (*) >60.00 mL/min  COMPREHENSIVE  METABOLIC PANEL     Status: None   Collection Time    05/05/14 12:42 PM      Result Value Ref Range   Sodium 138  135 - 145 mEq/L   Potassium 4.1  3.5 - 5.1 mEq/L   Chloride 102  96 - 112 mEq/L   CO2 29  19 - 32 mEq/L   Glucose, Bld 84  70 - 99 mg/dL   BUN 11  6 - 23 mg/dL   Creatinine, Ser 0.7  0.4 - 1.2 mg/dL   Total Bilirubin 0.4  0.2 - 1.2 mg/dL   Alkaline Phosphatase 64  39 - 117 U/L   AST 16  0 - 37 U/L   ALT 14  0 - 35 U/L   Total Protein 8.0  6.0 - 8.3 g/dL   Albumin 4.4  3.5 - 5.2 g/dL   Calcium 9.2  8.4 - 10.5 mg/dL   GFR 90.49  >60.00 mL/min  LIPID PANEL     Status: Abnormal   Collection Time    05/05/14 12:42 PM      Result Value Ref Range   Cholesterol 289 (*) 0 - 200 mg/dL   Comment: ATP III Classification       Desirable:  < 200 mg/dL               Borderline High:  200 - 239 mg/dL          High:  > = 240 mg/dL   Triglycerides 159.0 (*) 0.0 - 149.0 mg/dL   Comment: Normal:  <150 mg/dLBorderline High:  150 - 199 mg/dL   HDL 36.90 (*) >39.00 mg/dL   VLDL 31.8  0.0 - 40.0 mg/dL   LDL Cholesterol 220 (*) 0 - 99 mg/dL   Total CHOL/HDL Ratio 8     Comment:                Men          Women1/2 Average Risk     3.4          3.3Average Risk          5.0          4.42X Average Risk          9.6          7.13X Average Risk          15.0          11.0                       NonHDL 252.10     Comment: NOTE:  Non-HDL goal should be 30 mg/dL higher than patient's LDL goal (i.e. LDL goal of < 70 mg/dL, would have non-HDL goal of < 100 mg/dL)    Assessment/Plan: Cervical pain (neck) Will extend FMLA for 1 week.  No further extension will be given as patient should be able to resume normal work functions. Continue regimen discussed by Sports Medicine MD.  Continue PT.  Follow-up as scheduled.  Will attempt to obtain FMLA papers for patient as fax number is in her EMR. Will call when complete.

## 2014-05-29 NOTE — Assessment & Plan Note (Signed)
Will extend FMLA for 1 week.  No further extension will be given as patient should be able to resume normal work functions. Continue regimen discussed by Sports Medicine MD.  Continue PT.  Follow-up as scheduled.  Will attempt to obtain FMLA papers for patient as fax number is in her EMR. Will call when complete.

## 2014-06-01 ENCOUNTER — Ambulatory Visit (INDEPENDENT_AMBULATORY_CARE_PROVIDER_SITE_OTHER): Payer: BC Managed Care – PPO | Admitting: Family Medicine

## 2014-06-01 ENCOUNTER — Encounter: Payer: Self-pay | Admitting: Family Medicine

## 2014-06-01 VITALS — BP 127/80 | HR 82 | Ht 62.0 in | Wt 120.0 lb

## 2014-06-01 DIAGNOSIS — M542 Cervicalgia: Secondary | ICD-10-CM

## 2014-06-01 DIAGNOSIS — Z7689 Persons encountering health services in other specified circumstances: Secondary | ICD-10-CM

## 2014-06-01 NOTE — Telephone Encounter (Signed)
Received revised FMLA paperwork from [place]. Forms faxed to [facility] at [phone]. JG//CMA

## 2014-06-01 NOTE — Patient Instructions (Signed)
We will go ahead with an MRI of your cervical spine. We will write you out one additional week to allow [place] to obtain the MRI and go over results, next steps. Continue physical therapy in the meantime and medications as you have been.

## 2014-06-03 ENCOUNTER — Ambulatory Visit: Payer: BC Managed Care – PPO | Attending: Family Medicine | Admitting: Physical Therapy

## 2014-06-03 DIAGNOSIS — M542 Cervicalgia: Secondary | ICD-10-CM | POA: Insufficient documentation

## 2014-06-03 DIAGNOSIS — I10 Essential (primary) hypertension: Secondary | ICD-10-CM | POA: Diagnosis not present

## 2014-06-03 DIAGNOSIS — M6281 Muscle weakness (generalized): Secondary | ICD-10-CM | POA: Diagnosis not present

## 2014-06-03 DIAGNOSIS — Z5189 Encounter for other specified aftercare: Secondary | ICD-10-CM | POA: Insufficient documentation

## 2014-06-03 DIAGNOSIS — R293 Abnormal posture: Secondary | ICD-10-CM | POA: Diagnosis not present

## 2014-06-05 NOTE — Assessment & Plan Note (Signed)
consistent with degenerative disc disease with radiculopathy.  Pain severe and limited in that she cannot take prednisone or nsaids.  Continue flexeril with tylenol, tramadol as needed.  Will go ahead with MRI of cervical spine.  Continue physical therapy and home exercises.  Discussed ergonomic issues.  Heat as needed.

## 2014-06-05 NOTE — Progress Notes (Addendum)
Patient ID: Leah [name], female   DOB: [birth year], 68 y.o.   MRN: [id number]  PCP: Garnet Koyanagi, DO Referred by: Elyn Aquas [facility]  Subjective:   HPI: Patient is a 68 y.o. female here for neck pain.  10/22: Patient reports pain in right arm started just over 2 weeks ago. Had a blood draw in antecubital fossa and told they irritated a nerve or muscle when they did this. Reports having neck pain at the same time with burning down into her elbows. Pain in arm improved. Tried heat/ice, muscle relaxant.   Given antibiotics for phlebitis in arm.  Tramadol for pain. Has history of peptic ulcer disease so not taking nsaids. She had a doppler ultrasound of right upper extremity that was negative for DVT. C-spine radiographs showed spurs at C5-6, C6-7. No bowel/bladder dysfunction.  11/3: Patient reports she continues to struggle with pain in her neck going into both shoulders. Has not been back to work due to the pain. No bowel/bladder dysfunction.  Past Medical History  Diagnosis Date  . Esophageal spasm   . GERD (gastroesophageal reflux disease)   . Chronic gastritis   . Diverticulosis   . B12 deficiency   . IBS (irritable bowel syndrome)   . Anxiety   . Depression   . Hiatal hernia   . Iron deficiency anemia   . Hyperlipidemia   . Diverticulitis   . Barrett esophagus   . Rosacea   . Interstitial cystitis     Current Outpatient Prescriptions on File Prior to Visit  Medication Sig Dispense Refill  . acetaminophen (TYLENOL) 500 MG tablet Take 500 mg by mouth every 6 (six) hours as needed.      Marland Kitchen albuterol (PROAIR HFA) 108 (90 BASE) MCG/ACT inhaler Inhale 2 puffs into the lungs every 6 (six) hours as needed for wheezing. 1 Inhaler 0  . beclomethasone (QVAR) 80 MCG/ACT inhaler Inhale 2 puffs into the lungs 2 (two) times daily. 1 Inhaler 12  . cyclobenzaprine (FLEXERIL) 10 MG tablet Take 1 tablet (10 mg total) by mouth 3 (three) times daily as needed for muscle spasms. 30  tablet 0  . dicyclomine (BENTYL) 10 MG capsule Take 1 capsule by mouth three times daily as needed for cramping and spasms. 30 capsule 0  . esomeprazole (NEXIUM) 40 MG capsule Take 40 mg by mouth 2 (two) times daily before a meal.    . estradiol (VIVELLE-DOT) 0.1 MG/24HR Place 1 patch onto the skin 2 (two) times a week.      Marland Kitchen ibuprofen (ADVIL,MOTRIN) 800 MG tablet Take 1 tablet (800 mg total) by mouth 3 (three) times daily. 21 tablet 0  . lisinopril (PRINIVIL,ZESTRIL) 5 MG tablet Take 1 tablet (5 mg total) by mouth daily. 30 tablet 0  . LORazepam (ATIVAN) 0.5 MG tablet Take 0.5 mg by mouth at bedtime. At bedtime     . sertraline (ZOLOFT) 50 MG tablet Take 50 mg by mouth at bedtime.    . simvastatin (ZOCOR) 40 MG tablet Take 1 tablet (40 mg total) by mouth at bedtime. 90 tablet 3  . traMADol (ULTRAM) 50 MG tablet Take 1 tablet (50 mg total) by mouth every 8 (eight) hours as needed. 30 tablet 0   No current facility-administered medications on file prior to visit.    Past Surgical History  Procedure Laterality Date  . Tonsillectomy and adenoidectomy    . Tmj arthroplasty    . Vocal cord polypectomy    . Carpal tunnel release  1993  right  . Abdominal hysterectomy    . Cholecystectomy    . Partial colectomy    . Sinus surgery with instatrak    . Laparoscopic appendectomy N/A 09/21/2013    Procedure: APPENDECTOMY LAPAROSCOPIC;  Surgeon: Rolm Bookbinder, MD;  Location: [facility] OR;  Service: General;  Laterality: N/A;    Allergies  Allergen Reactions  . Amoxicillin-Pot Clavulanate Shortness Of Breath, Itching and Swelling  . Ciprofloxacin Shortness Of Breath, Itching and Swelling  . Moxifloxacin Shortness Of Breath, Itching and Swelling  . Penicillins Shortness Of Breath, Itching and Swelling  . Prednisone Other (See Comments)    GI irritation  . Sulfonamide Derivatives Shortness Of Breath, Itching and Swelling    History   Social History  . Marital Status: Married    Spouse  Name: N/A    Number of Children: N/A  . Years of Education: N/A   Occupational History  . Not on file.   Social History Main Topics  . Smoking status: Current Every Day Smoker -- 0.50 packs/day for 45 years    Types: Cigarettes  . Smokeless tobacco: Never Used  . Alcohol Use: No  . Drug Use: No  . Sexual Activity: Not on file   Other Topics Concern  . Not on file   Social History Narrative    Family History  Problem Relation Age of Onset  . Colon cancer Neg Hx   . Pancreatic cancer Mother   . Pancreatic cancer Father   . Colon polyps Mother   . Diabetes Father   . Heart disease Father   . Leukemia Father   . Prostate cancer Father   . Hypertension Father     BP 127/80 mmHg  Pulse 82  Ht 5\' 2"  (1.575 m)  Wt 120 lb (54.432 kg)  BMI 21.94 kg/m2  Review of Systems: See HPI above.    Objective:  Physical Exam:  Gen: NAD  Neck: No gross deformity, swelling, bruising. TTP mildly midline and right > left cervical paraspinal region.  No stepoffs. Only 5 degrees extension, painful.  Otherwise FROM. BUE strength 5/5.   Sensation intact to light touch. 1+ equal reflexes in triceps, biceps, brachioradialis tendons. Negative spurlings. NV intact distal BUEs.    Assessment & Plan:  1. Neck/right arm pain - consistent with degenerative disc disease with radiculopathy.  Pain severe and limited in that she cannot take prednisone or nsaids.  Continue flexeril with tylenol, tramadol as needed.  Will go ahead with MRI of cervical spine.  Continue physical therapy and home exercises.  Discussed ergonomic issues.  Heat as needed.   Addendum:  MRI reviewed and discussed with patient.  She has a few areas of DDD with greatest being at C5-6 on right that appears to be irritating right C6 nerve root.  Discussed considering ESI at this level vs continued PT - she will let us know if she wants to try ESI.

## 2014-06-06 ENCOUNTER — Ambulatory Visit (HOSPITAL_BASED_OUTPATIENT_CLINIC_OR_DEPARTMENT_OTHER)
Admission: RE | Admit: 2014-06-06 | Discharge: 2014-06-06 | Disposition: A | Discharge status: Home / Self Care | Payer: BC Managed Care – PPO | Source: Ambulatory Visit | Attending: Family Medicine | Admitting: Family Medicine

## 2014-06-06 DIAGNOSIS — M79602 Pain in left arm: Secondary | ICD-10-CM | POA: Diagnosis not present

## 2014-06-06 DIAGNOSIS — M542 Cervicalgia: Secondary | ICD-10-CM | POA: Diagnosis present

## 2014-06-06 DIAGNOSIS — M79601 Pain in right arm: Secondary | ICD-10-CM | POA: Insufficient documentation

## 2014-06-08 ENCOUNTER — Encounter: Payer: Self-pay | Admitting: Family Medicine

## 2014-06-08 ENCOUNTER — Ambulatory Visit (INDEPENDENT_AMBULATORY_CARE_PROVIDER_SITE_OTHER): Payer: BC Managed Care – PPO | Admitting: Family Medicine

## 2014-06-08 VITALS — BP 134/85 | HR 74 | Temp 98.5°F | Wt 120.6 lb

## 2014-06-08 DIAGNOSIS — Z23 Encounter for immunization: Secondary | ICD-10-CM

## 2014-06-08 DIAGNOSIS — I10 Essential (primary) hypertension: Secondary | ICD-10-CM

## 2014-06-08 DIAGNOSIS — M542 Cervicalgia: Secondary | ICD-10-CM

## 2014-06-08 MED ORDER — LISINOPRIL 5 MG PO TABS: 5.0000 mg | ORAL_TABLET | Freq: Every day | ORAL | Status: DC

## 2014-06-08 NOTE — Progress Notes (Signed)
Pre visit review using our clinic review tool, if applicable. No additional management support is needed unless otherwise documented below in the visit note. 

## 2014-06-08 NOTE — Patient Instructions (Addendum)

## 2014-06-08 NOTE — Progress Notes (Signed)
  Subjective:    Patient here for follow-up of elevated blood pressure.  She is not exercising and is adherent to a low-salt diet.  Blood pressure is well controlled at home. Cardiac symptoms: none. Patient denies: chest pain, chest pressure/discomfort, claudication, dyspnea, exertional chest pressure/discomfort, fatigue, irregular heart beat, lower extremity edema, near-syncope, orthopnea, palpitations, paroxysmal nocturnal dyspnea, syncope and tachypnea. Cardiovascular risk factors: advanced age (older than 27 for men, 69 for women), hypertension, sedentary lifestyle and smoking/ tobacco exposure. Use of agents associated with hypertension: none. History of target organ damage: none. She also had an MRI of the neck with Dr Barbaraann Barthel.  She has not heard anything yet.  Pt would like a copy to take to her ortho on Wed.  She con't to have pain and headaches.  She is in PT.  The following portions of the patient's history were reviewed and updated as appropriate: allergies, current medications, past family history, past medical history, past social history, past surgical history and problem list.  Review of Systems Pertinent items are noted in HPI.     Objective:    BP 134/85 mmHg  Pulse 74  Temp(Src) 98.5 F (36.9 C) (Oral)  Wt 120 lb 9.6 oz (54.704 kg)  SpO2 98% General appearance: alert, cooperative, appears stated age and no distress Neck: no adenopathy, no carotid bruit, no JVD, supple, symmetrical, trachea midline and thyroid not enlarged, symmetric, no tenderness/mass/nodules Lungs: clear to auscultation bilaterally Heart: S1, S2 normal Extremities: extremities normal, atraumatic, no cyanosis or edema    Assessment:    Hypertension, normal blood pressure . Evidence of target organ damage: none.    Plan:    Medication: no change. Regular aerobic exercise. Check blood pressures 2-3 times weekly and record. Follow up: 3 months and as needed.    1. Need for prophylactic vaccination  and inoculation against influenza   - Flu Vaccine QUAD 36+ mos PF IM (Fluarix Quad PF)  2. Essential hypertension   - lisinopril (PRINIVIL,ZESTRIL) 5 MG tablet; Take 1 tablet (5 mg total) by mouth daily.  Dispense: 90 tablet; Refill: 1

## 2014-06-08 NOTE — Assessment & Plan Note (Signed)
Per sport med and ortho Copy of MRI given to pt to take with her

## 2014-06-10 ENCOUNTER — Ambulatory Visit: Payer: BC Managed Care – PPO | Admitting: Rehabilitation

## 2014-06-10 ENCOUNTER — Telehealth: Payer: Self-pay

## 2014-06-10 DIAGNOSIS — Z5189 Encounter for other specified aftercare: Secondary | ICD-10-CM | POA: Diagnosis not present

## 2014-06-10 NOTE — Telephone Encounter (Signed)
Patient presents to the office with complaint of reaction to flu vaccine. Observed site and surrounding area. Red, swollen, itchy and warm to touch. Patient not in any distress with SOB or rapid pulse. (O2 99%--HR 67). Consult with Elyn Aquas PA-C who advised for patient to take benadryl in the evening and Claritin for the next several days along with apply ice pack to area. Patient agrees with plan. Gave precautionary measures for signs of distress and ED protocol.

## 2014-06-11 NOTE — Addendum Note (Signed)
Addended by: Dene Gentry on: 06/11/2014 02:02 PM   Modules accepted: Miquel Dunn

## 2014-06-15 ENCOUNTER — Ambulatory Visit: Payer: BC Managed Care – PPO | Admitting: Physical Therapy

## 2014-06-15 DIAGNOSIS — Z5189 Encounter for other specified aftercare: Secondary | ICD-10-CM | POA: Diagnosis not present

## 2014-06-16 ENCOUNTER — Other Ambulatory Visit: Payer: Self-pay | Admitting: Physical Medicine and Rehabilitation

## 2014-06-16 DIAGNOSIS — M542 Cervicalgia: Secondary | ICD-10-CM

## 2014-06-18 ENCOUNTER — Ambulatory Visit: Payer: BC Managed Care – PPO | Admitting: Physical Therapy

## 2014-06-18 DIAGNOSIS — Z5189 Encounter for other specified aftercare: Secondary | ICD-10-CM | POA: Diagnosis not present

## 2014-06-19 ENCOUNTER — Ambulatory Visit
Admission: RE | Admit: 2014-06-19 | Discharge: 2014-06-19 | Disposition: A | Discharge status: Home / Self Care | Payer: BC Managed Care – PPO | Source: Ambulatory Visit | Attending: Physical Medicine and Rehabilitation | Admitting: Physical Medicine and Rehabilitation

## 2014-06-19 DIAGNOSIS — M542 Cervicalgia: Secondary | ICD-10-CM

## 2014-06-19 MED ORDER — IOHEXOL 300 MG/ML  SOLN
1.0000 mL | Freq: Once | INTRAMUSCULAR | Status: AC | PRN
  Administered 2014-06-19: 1 mL via EPIDURAL

## 2014-06-19 MED ORDER — TRIAMCINOLONE ACETONIDE 40 MG/ML IJ SUSP (RADIOLOGY)
60.0000 mg | Freq: Once | INTRAMUSCULAR | Status: AC
  Administered 2014-06-19: 60 mg via EPIDURAL

## 2014-06-19 NOTE — Discharge Instructions (Signed)

## 2014-06-22 ENCOUNTER — Encounter: Payer: BC Managed Care – PPO | Admitting: Rehabilitation

## 2014-06-23 ENCOUNTER — Ambulatory Visit: Payer: BC Managed Care – PPO | Admitting: Rehabilitation

## 2014-06-24 ENCOUNTER — Ambulatory Visit: Payer: BC Managed Care – PPO | Admitting: Physical Therapy

## 2014-06-29 ENCOUNTER — Ambulatory Visit: Payer: BC Managed Care – PPO | Admitting: Rehabilitation

## 2014-06-29 DIAGNOSIS — Z5189 Encounter for other specified aftercare: Secondary | ICD-10-CM | POA: Diagnosis not present

## 2014-07-02 ENCOUNTER — Ambulatory Visit: Payer: BC Managed Care – PPO | Admitting: Physical Therapy

## 2014-07-06 ENCOUNTER — Ambulatory Visit: Payer: BC Managed Care – PPO | Attending: Family Medicine | Admitting: Rehabilitation

## 2014-07-06 ENCOUNTER — Other Ambulatory Visit: Payer: Self-pay

## 2014-07-06 DIAGNOSIS — M542 Cervicalgia: Secondary | ICD-10-CM | POA: Insufficient documentation

## 2014-07-06 DIAGNOSIS — Z5189 Encounter for other specified aftercare: Secondary | ICD-10-CM | POA: Insufficient documentation

## 2014-07-06 DIAGNOSIS — R293 Abnormal posture: Secondary | ICD-10-CM | POA: Insufficient documentation

## 2014-07-06 DIAGNOSIS — M6281 Muscle weakness (generalized): Secondary | ICD-10-CM | POA: Insufficient documentation

## 2014-07-06 DIAGNOSIS — I10 Essential (primary) hypertension: Secondary | ICD-10-CM | POA: Insufficient documentation

## 2014-07-07 ENCOUNTER — Encounter: Payer: Self-pay | Admitting: Family Medicine

## 2014-07-07 ENCOUNTER — Ambulatory Visit (INDEPENDENT_AMBULATORY_CARE_PROVIDER_SITE_OTHER): Payer: BC Managed Care – PPO | Admitting: Family Medicine

## 2014-07-07 VITALS — BP 116/73 | HR 88 | Temp 98.2°F | Wt 120.0 lb

## 2014-07-07 DIAGNOSIS — Z8669 Personal history of other diseases of the nervous system and sense organs: Secondary | ICD-10-CM

## 2014-07-07 DIAGNOSIS — M5010 Cervical disc disorder with radiculopathy, unspecified cervical region: Secondary | ICD-10-CM

## 2014-07-07 MED ORDER — HYDROCODONE-ACETAMINOPHEN 5-325 MG PO TABS: 1.0000 | ORAL_TABLET | Freq: Four times a day (QID) | ORAL | Status: DC | PRN

## 2014-07-07 NOTE — Patient Instructions (Signed)
Headache and Arthritis Headaches and arthritis are common problems. This causes an interest in the possible role of arthritis in causing headaches. Several major forms of arthritis exist. Two of the most common types are:  Rheumatoid arthritis.  Osteoarthritis. Rheumatoid arthritis may begin at any age. It is a condition in which the body attacks some of its own tissues, thinking they do not belong. This leads to destruction of the bony areas around the joints. This condition may afflict any of the body's joints. It usually produces a deformity of the joint. The hands and fingers no longer appear straight but often appear angled towards one side. In some cases, the spine may be involved. Most often it is the vertebrae of the neck (cervical spine). The areas of the neck most commonly afflicted by rheumatoid arthritis are the first and second cervical vertebrae. Curiously, rheumatoid arthritis, though it often produces severe deformities, is not always painful.  The more common form of arthritis is osteoarthritis. It is a wear-and-tear form of arthritis. It usually does not produce deformity of the joints or destruction of the bony tissues. Rather the ligaments weaken. They may be calcified due to the body's attempt to heal the damage. The larger joints of the body and those joints that take the most stress and strain are the most often affected. In the neck region this osteoarthritis usually involves the fifth, sixth and seventh vertebrae. This is because the effects of posture produce the most fatigue on them. Osteoarthritis is often more painful than rheumatoid arthritis.  During workups for arthritis, a test evaluating inflammation, (the sedimentation rate) often is performed. In rheumatoid arthritis, this test will usually be elevated. Other tests for inflammation may also be elevated. In patients with osteoarthritis, x-rays of the neck or jaw joints will show changes from "lipping" of the vertebrae. This  is caused by calcium deposits in the ligaments. Or they may show narrowing of the space between the vertebrae, or spur formation (from calcium deposits). If severe, it may cause obstruction of the holes where the nerves pass from the spine to the body. In rheumatoid arthritis, dislocation of vertebrae may occur in the upper neck. CT scan and MRI in patients with osteoarthritis may show bulging of the discs that cushion the vertebrae. In the most severe cases, herniation of the discs may occur.  Headaches, felt as a pain in the neck, may be caused by arthritis if the first, second or third vertebrae are involved. This condition is due to the nerves that supply the scalp only originating from this area of the spine. Neck pain itself, whether alone or coupled with headaches, can involve any portion of the neck. If the jaw is involved, the symptoms are similar to those of Temporomandibular Joint Syndrome (TMJ).  The progressive severity of rheumatoid arthritis may be slowed by a variety of potent medications. In osteoarthritis, its progression is not usually hindered by medication. The following may be helpful in slowing the advancement of the disorder:  Lifestyle adjustment.  Exercise.  Rest.  Weight loss. Medications, such as the nonsteroidal anti-inflammatory agents (NSAIDs), are useful. They may reduce the pain and improve the reduced motion which occurs in joints afflicted by arthritis. From some studies, the use of acetaminophen appears to be as effective in controlling the pain of arthritis as the NSAIDs. Physical modalities may also be useful for arthritis. They include:  Heat.  Massage.  Exercise. But physical therapy must be prescribed by a caregiver, just as most medications   for arthritis.  Document Released: 10/07/2003 Document Revised: 07/22/2013 Document Reviewed: 10/20/2013 [employer] Patient Information 2015 [employer]. This information is not intended to replace advice given to  you by your health care provider. Make sure you discuss any questions you have with your health care provider.  

## 2014-07-07 NOTE — Progress Notes (Signed)
   Subjective:    Patient ID: Leah [name], female    DOB: 1946-07-03, 68 y.o.   MRN: [id number]  HPI Pt here c/o inc headaches-- -pt had 1 injection by ortho that did not help pain.  Pt states the radiologist said she may need an mri of brain.  Review of Systems As above    Objective:   Physical Exam BP 116/73 mmHg  Pulse 88  Temp(Src) 98.2 F (36.8 C) (Oral)  Wt 120 lb (54.432 kg)  SpO2 98% General appearance: alert, cooperative, appears stated age and no distress Neck: no adenopathy, no carotid bruit, supple, symmetrical, trachea midline and thyroid not enlarged, symmetric, no tenderness/mass/nodules Lungs: clear to auscultation bilaterally Heart: regular rate and rhythm, S1, S2 normal, no murmur, click, rub or gallop Pelvic: deferred Extremities: extremities normal, atraumatic, no cyanosis or edema Lymph nodes: Cervical, supraclavicular, and axillary nodes normal. Neurologic: Alert and oriented X 3, normal strength and tone. Normal symmetric reflexes. Normal coordination and gait      Assessment & Plan:  1. Hx of migraines Check mri - MR Brain Wo Contrast; Future - HYDROcodone-acetaminophen (NORCO/VICODIN) 5-325 MG per tablet; Take 1 tablet by mouth every 6 (six) hours as needed for moderate pain.  Dispense: 30 tablet; Refill: 0  2. Cervical disc disorder with radiculopathy of cervical region

## 2014-07-07 NOTE — Progress Notes (Signed)
Pre visit review using our clinic review tool, if applicable. No additional management support is needed unless otherwise documented below in the visit note. 

## 2014-07-09 ENCOUNTER — Ambulatory Visit: Payer: BC Managed Care – PPO | Admitting: Physical Therapy

## 2014-07-13 ENCOUNTER — Ambulatory Visit (INDEPENDENT_AMBULATORY_CARE_PROVIDER_SITE_OTHER): Payer: BC Managed Care – PPO

## 2014-07-13 ENCOUNTER — Other Ambulatory Visit: Payer: Self-pay

## 2014-07-13 DIAGNOSIS — R51 Headache: Secondary | ICD-10-CM

## 2014-07-13 DIAGNOSIS — Z8669 Personal history of other diseases of the nervous system and sense organs: Secondary | ICD-10-CM

## 2014-07-14 ENCOUNTER — Telehealth: Payer: Self-pay | Admitting: Family Medicine

## 2014-07-14 DIAGNOSIS — G43809 Other migraine, not intractable, without status migrainosus: Secondary | ICD-10-CM

## 2014-07-14 NOTE — Telephone Encounter (Signed)
MSG left to call the office      KP 

## 2014-07-14 NOTE — Telephone Encounter (Signed)
No acute changes--- refer to neuro if cont

## 2014-07-14 NOTE — Telephone Encounter (Signed)
Inquiring about x ray results

## 2014-07-15 NOTE — Telephone Encounter (Signed)
Patient returned phone call. Best # [phone]

## 2014-07-16 ENCOUNTER — Telehealth: Payer: Self-pay | Admitting: Medical

## 2014-07-16 MED ORDER — LOSARTAN POTASSIUM 50 MG PO TABS: 50.0000 mg | ORAL_TABLET | Freq: Every day | ORAL | Status: DC

## 2014-07-16 NOTE — Telephone Encounter (Signed)
Patient has been made aware and voiced understanding.     KP 

## 2014-07-16 NOTE — Telephone Encounter (Signed)
I see in old note that states I would write losartan 50 mg q day for her bp. I will rx limited number for one month. Pt needs to come in for bp check.

## 2014-07-16 NOTE — Telephone Encounter (Signed)
Message to lpn. Make sure pt is off of lisinopril if she is using losartan. Sent losartan rx to pharmacy.

## 2014-07-17 ENCOUNTER — Ambulatory Visit: Payer: BC Managed Care – PPO | Admitting: Family Medicine

## 2014-07-17 NOTE — Telephone Encounter (Signed)
Left message for patient regarding not taking Lisinopril if she is taking Losartin.

## 2014-07-20 ENCOUNTER — Ambulatory Visit (INDEPENDENT_AMBULATORY_CARE_PROVIDER_SITE_OTHER): Payer: BC Managed Care – PPO | Admitting: Family Medicine

## 2014-07-20 ENCOUNTER — Encounter: Payer: Self-pay | Admitting: Family Medicine

## 2014-07-20 VITALS — BP 130/68 | HR 90 | Temp 98.3°F | Resp 14 | Wt 121.4 lb

## 2014-07-20 DIAGNOSIS — M5090 Cervical disc disorder, unspecified, unspecified cervical region: Secondary | ICD-10-CM

## 2014-07-20 DIAGNOSIS — Z1231 Encounter for screening mammogram for malignant neoplasm of breast: Secondary | ICD-10-CM

## 2014-07-20 MED ORDER — CYCLOBENZAPRINE HCL 10 MG PO TABS: 10.0000 mg | ORAL_TABLET | Freq: Three times a day (TID) | ORAL | Status: DC | PRN

## 2014-07-20 NOTE — Progress Notes (Signed)
   Subjective:    Patient ID: Leah [name], female    DOB: 1945-12-08, 68 y.o.   MRN: [id number]  HPI Pt here c/o headache that are con't --- sport med and ortho told her to come back here per pt.  Mri reviewed again.     Review of Systems As above     Objective:   Physical Exam  BP 130/68 mmHg  Pulse 90  Temp(Src) 98.3 F (36.8 C) (Oral)  Resp 14  Wt 121 lb 6.4 oz (55.067 kg)  SpO2 94% General appearance: alert, cooperative, appears stated age and no distress Neurologic: Alert and oriented X 3, normal strength and tone. Normal symmetric reflexes. Normal coordination and gait  Result     CLINICAL DATA: Neck pain and bilateral arm pain since 05/05/2014. No known injury. No numbness or tingling.  EXAM: MRI CERVICAL SPINE WITHOUT CONTRAST  TECHNIQUE: Multiplanar, multisequence MR imaging of the cervical spine was performed. No intravenous contrast was administered.  COMPARISON: 03/07/2008  FINDINGS: Normal alignment of the cervical vertebral bodies. They demonstrate normal marrow signal. No acute bony findings. The cervical spinal cord demonstrates normal signal intensity. No cord lesions or syrinx. The facets are normally aligned. Moderate facet disease.  C2-3: No significant findings.  C3-4: Mild uncinate spurring changes bilaterally with mild foraminal encroachment, left greater than right. No spinal stenosis.  C4-5: Shallow broad-based disc protrusion with flattening of the ventral thecal sac. Shallow disc osteophyte complexes bilaterally with mild bilateral foraminal narrowing, right greater than left.  C5-6: Broad-based central, right paracentral and medial foraminal disc protrusion with mild mass effect on the right side of the thecal sac and encroachment on the right C6 nerve root.  C6-7: No significant findings.  C7-T1: No significant findings.  IMPRESSION: Progressive degenerative disc disease noted at C3-4, C4-5 and  C5-6.  Bilateral foraminal encroachment at C3-4, left greater than right.  Bilateral foraminal narrowing at C4-5, right greater than left.  Broad-based central, right paracentral and medial foraminal disc protrusion at C5-6 likely effecting the right C6 nerve root.   Electronically Signed  By       Assessment & Plan:  1. Visit for screening mammogram   - MM Digital Screening; Future  2. Cervical neck pain with evidence of disc disease Refer to neurosurgery - Ambulatory referral to Neurosurgery - cyclobenzaprine (FLEXERIL) 10 MG tablet; Take 1 tablet (10 mg total) by mouth 3 (three) times daily as needed for muscle spasms.  Dispense: 30 tablet; Refill: 0

## 2014-07-20 NOTE — Progress Notes (Signed)
Pre visit review using our clinic review tool, if applicable. No additional management support is needed unless otherwise documented below in the visit note. 

## 2014-08-04 ENCOUNTER — Encounter: Payer: Self-pay | Admitting: Family Medicine

## 2014-08-04 ENCOUNTER — Ambulatory Visit (INDEPENDENT_AMBULATORY_CARE_PROVIDER_SITE_OTHER): Payer: BLUE CROSS/BLUE SHIELD | Admitting: Family Medicine

## 2014-08-04 VITALS — BP 126/78 | HR 78 | Temp 97.9°F | Resp 16 | Wt 122.6 lb

## 2014-08-04 DIAGNOSIS — I10 Essential (primary) hypertension: Secondary | ICD-10-CM

## 2014-08-04 DIAGNOSIS — Z8669 Personal history of other diseases of the nervous system and sense organs: Secondary | ICD-10-CM

## 2014-08-04 DIAGNOSIS — E785 Hyperlipidemia, unspecified: Secondary | ICD-10-CM

## 2014-08-04 LAB — POCT URINALYSIS DIPSTICK
BILIRUBIN UA: NEGATIVE
GLUCOSE UA: NEGATIVE
KETONES UA: NEGATIVE
Leukocytes, UA: NEGATIVE
NITRITE UA: NEGATIVE
PH UA: 5
Protein, UA: NEGATIVE
RBC UA: NEGATIVE
Urobilinogen, UA: NEGATIVE

## 2014-08-04 LAB — BASIC METABOLIC PANEL
BUN: 13 mg/dL (ref 6–23)
CO2: 27 mEq/L (ref 19–32)
CREATININE: 0.8 mg/dL (ref 0.4–1.2)
Calcium: 9 mg/dL (ref 8.4–10.5)
Chloride: 105 mEq/L (ref 96–112)
GFR: 73.06 mL/min (ref >60.00)
GLUCOSE: 91 mg/dL (ref 70–99)
Potassium: 4.1 mEq/L (ref 3.5–5.1)
Sodium: 139 mEq/L (ref 135–145)

## 2014-08-04 LAB — CBC WITH DIFFERENTIAL/PLATELET
BASOS PCT: 1 % (ref 0.0–3.0)
Basophils Absolute: 0.1 10*3/uL (ref 0.0–0.1)
EOS ABS: 0.3 10*3/uL (ref 0.0–0.7)
Eosinophils Relative: 5.7 % — ABNORMAL HIGH (ref 0.0–5.0)
HEMATOCRIT: 40.8 % (ref 36.0–46.0)
Hemoglobin: 13.4 g/dL (ref 12.0–15.0)
Lymphocytes Relative: 34.4 % (ref 12.0–46.0)
Lymphs Abs: 2.1 10*3/uL (ref 0.7–4.0)
MCHC: 32.8 g/dL (ref 30.0–36.0)
MCV: 89.4 fl (ref 78.0–100.0)
MONO ABS: 0.5 10*3/uL (ref 0.1–1.0)
Monocytes Relative: 7.7 % (ref 3.0–12.0)
NEUTROS ABS: 3.1 10*3/uL (ref 1.4–7.7)
Neutrophils Relative %: 51.2 % (ref 43.0–77.0)
Platelets: 226 10*3/uL (ref 150.0–400.0)
RBC: 4.57 Mil/uL (ref 3.87–5.11)
RDW: 15.4 % (ref 11.5–15.5)
WBC: 6.1 10*3/uL (ref 4.0–10.5)

## 2014-08-04 LAB — HEPATIC FUNCTION PANEL
ALT: 9 U/L (ref 0–35)
AST: 13 U/L (ref 0–37)
Albumin: 4.1 g/dL (ref 3.5–5.2)
Alkaline Phosphatase: 64 U/L (ref 39–117)
BILIRUBIN DIRECT: 0 mg/dL (ref 0.0–0.3)
TOTAL PROTEIN: 7.1 g/dL (ref 6.0–8.3)
Total Bilirubin: 0.2 mg/dL (ref 0.2–1.2)

## 2014-08-04 LAB — LIPID PANEL
Cholesterol: 273 mg/dL — ABNORMAL HIGH (ref 0–200)
HDL: 36 mg/dL — ABNORMAL LOW (ref >39.00)
LDL Cholesterol: 200 mg/dL — ABNORMAL HIGH (ref 0–99)
NONHDL: 237
Total CHOL/HDL Ratio: 8
Triglycerides: 185 mg/dL — ABNORMAL HIGH (ref 0.0–149.0)
VLDL: 37 mg/dL (ref 0.0–40.0)

## 2014-08-04 LAB — MICROALBUMIN / CREATININE URINE RATIO
Creatinine,U: 188.4 mg/dL
MICROALB UR: 1 mg/dL (ref 0.0–1.9)
Microalb Creat Ratio: 0.5 mg/g (ref 0.0–30.0)

## 2014-08-04 MED ORDER — HYDROCODONE-ACETAMINOPHEN 5-325 MG PO TABS: 1.0000 | ORAL_TABLET | Freq: Four times a day (QID) | ORAL | Status: DC | PRN

## 2014-08-04 NOTE — Progress Notes (Signed)
Subjective:    Patient here for follow-up of elevated blood pressure.  She is not exercising and is adherent to a low-salt diet.  Blood pressure is well controlled at home. Cardiac symptoms: none. Patient denies: chest pain, chest pressure/discomfort, claudication, dyspnea, exertional chest pressure/discomfort, fatigue, irregular heart beat, lower extremity edema, near-syncope, orthopnea, palpitations, paroxysmal nocturnal dyspnea, syncope and tachypnea. Cardiovascular risk factors: advanced age (older than 47 for men, 42 for women), dyslipidemia, hypertension, sedentary lifestyle and smoking/ tobacco exposure. Use of agents associated with hypertension: none. History of target organ damage: none. Pt is still c/o headache and neck pain from degen disc disease in neck and foraminal encroachment.  She has an appointment with Dr Arnoldo Morale Aug 18, 2014.   Pt is also here to f/u cholesterol.  --- no complaints with meds.  She is fasting today. The following portions of the patient's history were reviewed and updated as appropriate:  She  has a past medical history of Esophageal spasm; GERD (gastroesophageal reflux disease); Chronic gastritis; Diverticulosis; B12 deficiency; IBS (irritable bowel syndrome); Anxiety; Depression; Hiatal hernia; Iron deficiency anemia; Hyperlipidemia; Diverticulitis; Barrett esophagus; Rosacea; and Interstitial cystitis. She  does not have any pertinent problems on file. She  has past surgical history that includes Tonsillectomy and adenoidectomy; TMJ Arthroplasty; vocal cord polypectomy; Carpal tunnel release (1993); Abdominal hysterectomy; Cholecystectomy; Partial colectomy; Sinus surgery with Instatrak; and laparoscopic appendectomy (N/A, 09/21/2013). Her family history includes Colon polyps in her mother; Diabetes in her father; Heart disease in her father; Hypertension in her father; Leukemia in her father; Pancreatic cancer in her father and mother; Prostate cancer in her  father. There is no history of Colon cancer. She  reports that she has been smoking Cigarettes.  She has a 22.5 pack-year smoking history. She has never used smokeless tobacco. She reports that she does not drink alcohol or use illicit drugs. She has a current medication list which includes the following prescription(s): acetaminophen, albuterol, beclomethasone, cyclobenzaprine, dicyclomine, esomeprazole, estradiol, hydrocodone-acetaminophen, ibuprofen, lisinopril, lorazepam, sertraline, and simvastatin. Current Outpatient Prescriptions on File Prior to Visit  Medication Sig Dispense Refill  . acetaminophen (TYLENOL) 500 MG tablet Take 500 mg by mouth every 6 (six) hours as needed.      Marland Kitchen albuterol (PROAIR HFA) 108 (90 BASE) MCG/ACT inhaler Inhale 2 puffs into the lungs every 6 (six) hours as needed for wheezing. 1 Inhaler 0  . beclomethasone (QVAR) 80 MCG/ACT inhaler Inhale 2 puffs into the lungs 2 (two) times daily. 1 Inhaler 12  . cyclobenzaprine (FLEXERIL) 10 MG tablet Take 1 tablet (10 mg total) by mouth 3 (three) times daily as needed for muscle spasms. 30 tablet 0  . dicyclomine (BENTYL) 10 MG capsule Take 1 capsule by mouth three times daily as needed for cramping and spasms. 30 capsule 0  . esomeprazole (NEXIUM) 40 MG capsule Take 40 mg by mouth 2 (two) times daily before a meal.    . estradiol (VIVELLE-DOT) 0.1 MG/24HR Place 1 patch onto the skin 2 (two) times a week.      Marland Kitchen ibuprofen (ADVIL,MOTRIN) 800 MG tablet Take 1 tablet (800 mg total) by mouth 3 (three) times daily. 21 tablet 0  . lisinopril (PRINIVIL,ZESTRIL) 5 MG tablet   0  . LORazepam (ATIVAN) 0.5 MG tablet Take 0.5 mg by mouth at bedtime. At bedtime     . sertraline (ZOLOFT) 50 MG tablet Take 50 mg by mouth at bedtime.    . simvastatin (ZOCOR) 40 MG tablet Take 1 tablet (40 mg  total) by mouth at bedtime. 90 tablet 3   No current facility-administered medications on file prior to visit.   She is allergic to amoxicillin-pot  clavulanate; ciprofloxacin; moxifloxacin; penicillins; sulfonamide derivatives; and prednisone..  Review of Systems     Objective:    BP 126/78 mmHg  Pulse 78  Temp(Src) 97.9 F (36.6 C) (Oral)  Resp 16  Wt 122 lb 9.6 oz (55.611 kg)  SpO2 95% General appearance: alert, cooperative, appears stated age and no distress Neck: no adenopathy, supple, symmetrical, trachea midline and thyroid not enlarged, symmetric, no tenderness/mass/nodules Lungs: clear to auscultation bilaterally Heart: S1, S2 normal Extremities: extremities normal, atraumatic, no cyanosis or edema    Assessment:    Hypertension, normal blood pressure . Evidence of target organ damage: none.    Plan:    Medication: no change. Dietary sodium restriction. Follow up: 6 months and as needed.    1. Hx of migraines From DDD in neck---  - HYDROcodone-acetaminophen (NORCO/VICODIN) 5-325 MG per tablet; Take 1 tablet by mouth every 6 (six) hours as needed for moderate pain.  Dispense: 30 tablet; Refill: 0 - Basic metabolic panel  2. Essential hypertension Stable, con't lisinopril  - Basic metabolic panel - CBC with Differential - Microalbumin / creatinine urine ratio - POCT urinalysis dipstick  3. Hyperlipidemia con't zocor-- check labs - Basic metabolic panel - Hepatic function panel - Lipid panel - Microalbumin / creatinine urine ratio - POCT urinalysis dipstick

## 2014-08-04 NOTE — Progress Notes (Signed)
Pre visit review using our clinic review tool, if applicable. No additional management support is needed unless otherwise documented below in the visit note. 

## 2014-08-04 NOTE — Patient Instructions (Signed)

## 2014-08-12 ENCOUNTER — Encounter: Payer: Self-pay | Admitting: *Deleted

## 2014-08-12 ENCOUNTER — Telehealth: Payer: Self-pay | Admitting: *Deleted

## 2014-08-12 DIAGNOSIS — E785 Hyperlipidemia, unspecified: Secondary | ICD-10-CM

## 2014-08-12 MED ORDER — ATORVASTATIN CALCIUM 40 MG PO TABS: 40.0000 mg | ORAL_TABLET | Freq: Every day | ORAL | Status: DC

## 2014-08-12 NOTE — Telephone Encounter (Signed)
Patient called and notified of lab results.  Rx for Lipitor sent to [facility] pharmacy per patient request.  Lipid/ hep labs ordered and lab visit scheduled for 11/10/14.  Patient verbalized understanding.  Letter sent to patient as well.     eal

## 2014-08-12 NOTE — Telephone Encounter (Signed)
-----   Message from Rosalita Chessman, DO sent at 08/04/2014 10:24 PM EST ----- zocor not strong enough---change to lipitor 40 mg #30  1 po qhs, 2 refills.  Cholesterol--- LDL goal < 100,  HDL >40,  TG < 150.  Diet and exercise will increase HDL and decrease LDL and TG.  Fish,  Fish Oil, Flaxseed oil will also help increase the HDL and decrease Triglycerides.   Recheck labs in 3 months.   Lipid, hep-- hyperlipidemia.

## 2014-08-13 ENCOUNTER — Encounter (HOSPITAL_COMMUNITY): Payer: Self-pay | Admitting: Surgery

## 2014-08-18 ENCOUNTER — Telehealth: Payer: Self-pay | Admitting: Neurology

## 2014-08-18 DIAGNOSIS — M4712 Other spondylosis with myelopathy, cervical region: Secondary | ICD-10-CM | POA: Insufficient documentation

## 2014-08-18 DIAGNOSIS — M4722 Other spondylosis with radiculopathy, cervical region: Secondary | ICD-10-CM | POA: Insufficient documentation

## 2014-08-18 NOTE — Telephone Encounter (Signed)
Pt called and said that she was having surgery and did not need appt let dr Etter Sjogren office know as well

## 2014-08-20 ENCOUNTER — Encounter: Payer: Self-pay | Admitting: Family Medicine

## 2014-08-20 ENCOUNTER — Ambulatory Visit (INDEPENDENT_AMBULATORY_CARE_PROVIDER_SITE_OTHER): Payer: BLUE CROSS/BLUE SHIELD | Admitting: Family Medicine

## 2014-08-20 VITALS — BP 120/70 | HR 85 | Temp 97.8°F | Wt 122.8 lb

## 2014-08-20 DIAGNOSIS — K219 Gastro-esophageal reflux disease without esophagitis: Secondary | ICD-10-CM

## 2014-08-20 DIAGNOSIS — M542 Cervicalgia: Secondary | ICD-10-CM

## 2014-08-20 MED ORDER — ESOMEPRAZOLE MAGNESIUM 40 MG PO CPDR: 40.0000 mg | DELAYED_RELEASE_CAPSULE | Freq: Two times a day (BID) | ORAL | Status: DC

## 2014-08-20 NOTE — Assessment & Plan Note (Signed)
Pt to have surgery-- date to be scheduled Surgeon-- Dr Arnoldo Morale

## 2014-08-20 NOTE — Progress Notes (Signed)
Pre visit review using our clinic review tool, if applicable. No additional management support is needed unless otherwise documented below in the visit note. 

## 2014-08-20 NOTE — Progress Notes (Signed)
Subjective:    Patient ID: Leah [name], female    DOB: 07-04-1946, 69 y.o.   MRN: [id number]  HPI Pt here to f/u from NS visit.  She is going to have cervical fusion but no date has been set.  She will call them back when she leaves here.  Pt has had several surgeries in past and has never had a problem with anaesthesia.    Past Medical History  Diagnosis Date  . Esophageal spasm   . GERD (gastroesophageal reflux disease)   . Chronic gastritis   . Diverticulosis   . B12 deficiency   . IBS (irritable bowel syndrome)   . Anxiety   . Depression   . Hiatal hernia   . Iron deficiency anemia   . Hyperlipidemia   . Diverticulitis   . Barrett esophagus   . Rosacea   . Interstitial cystitis    Past Surgical History  Procedure Laterality Date  . Tonsillectomy and adenoidectomy    . Tmj arthroplasty    . Vocal cord polypectomy    . Carpal tunnel release  1993    right  . Abdominal hysterectomy    . Cholecystectomy    . Partial colectomy    . Sinus surgery with instatrak    . Laparoscopic appendectomy N/A 09/21/2013    Procedure: APPENDECTOMY LAPAROSCOPIC;  Surgeon: Rolm Bookbinder, MD;  Location: [facility] OR;  Service: General;  Laterality: N/A;   Family History  Problem Relation Age of Onset  . Colon cancer Neg Hx   . Pancreatic cancer Mother   . Pancreatic cancer Father   . Colon polyps Mother   . Diabetes Father   . Heart disease Father   . Leukemia Father   . Prostate cancer Father   . Hypertension Father    Current Outpatient Prescriptions  Medication Sig Dispense Refill  . acetaminophen (TYLENOL) 500 MG tablet Take 500 mg by mouth every 6 (six) hours as needed.      Marland Kitchen albuterol (PROAIR HFA) 108 (90 BASE) MCG/ACT inhaler Inhale 2 puffs into the lungs every 6 (six) hours as needed for wheezing. 1 Inhaler 0  . atorvastatin (LIPITOR) 40 MG tablet Take 1 tablet (40 mg total) by mouth daily at 6 PM. 30 tablet 2  . beclomethasone (QVAR) 80 MCG/ACT inhaler Inhale 2  puffs into the lungs 2 (two) times daily. 1 Inhaler 12  . cyclobenzaprine (FLEXERIL) 10 MG tablet Take 1 tablet (10 mg total) by mouth 3 (three) times daily as needed for muscle spasms. 30 tablet 0  . dicyclomine (BENTYL) 10 MG capsule Take 1 capsule by mouth three times daily as needed for cramping and spasms. 30 capsule 0  . esomeprazole (NEXIUM) 40 MG capsule Take 1 capsule (40 mg total) by mouth 2 (two) times daily before a meal. 90 capsule 3  . estradiol (VIVELLE-DOT) 0.1 MG/24HR Place 1 patch onto the skin 2 (two) times a week.      Marland Kitchen HYDROcodone-acetaminophen (NORCO/VICODIN) 5-325 MG per tablet Take 1 tablet by mouth every 6 (six) hours as needed for moderate pain. 30 tablet 0  . ibuprofen (ADVIL,MOTRIN) 800 MG tablet Take 1 tablet (800 mg total) by mouth 3 (three) times daily. 21 tablet 0  . lisinopril (PRINIVIL,ZESTRIL) 5 MG tablet   0  . LORazepam (ATIVAN) 0.5 MG tablet Take 0.5 mg by mouth at bedtime. At bedtime     . sertraline (ZOLOFT) 50 MG tablet Take 50 mg by mouth at bedtime.  No current facility-administered medications for this visit.       Review of Systems As above    Objective:   Physical Exam BP 120/70 mmHg  Pulse 85  Temp(Src) 97.8 F (36.6 C) (Oral)  Wt 122 lb 12.8 oz (55.702 kg)  SpO2 95% General appearance: alert, cooperative, appears stated age and no distress Throat: lips, mucosa, and tongue normal; teeth and gums normal Neck: no adenopathy, no carotid bruit, no JVD, supple, symmetrical, trachea midline and thyroid not enlarged, symmetric, no tenderness/mass/nodules Lungs: clear to auscultation bilaterally Heart: S1, S2 normal Extremities: extremities normal, atraumatic, no cyanosis or edema .     Assessment & Plan:  1. Gastroesophageal reflux disease, esophagitis presence not specified Refill meds - esomeprazole (NEXIUM) 40 MG capsule; Take 1 capsule (40 mg total) by mouth 2 (two) times daily before a meal.  Dispense: 90 capsule; Refill:  3  2. Cervical pain (neck) Pt to have surgery --- Dr Arnoldo Morale Surgery to be scheduled

## 2014-09-01 ENCOUNTER — Ambulatory Visit: Payer: BC Managed Care – PPO | Admitting: Neurology

## 2014-09-07 ENCOUNTER — Inpatient Hospital Stay (HOSPITAL_COMMUNITY)
Admission: AD | Admit: 2014-09-07 | Discharge: 2014-09-10 | DRG: 517 | Disposition: A | Discharge status: Home / Self Care | Payer: BLUE CROSS/BLUE SHIELD | Source: Ambulatory Visit | Attending: Neurosurgery | Admitting: Neurosurgery

## 2014-09-07 ENCOUNTER — Inpatient Hospital Stay (HOSPITAL_COMMUNITY): Payer: BLUE CROSS/BLUE SHIELD

## 2014-09-07 ENCOUNTER — Other Ambulatory Visit (HOSPITAL_COMMUNITY): Payer: Self-pay | Admitting: Neurosurgery

## 2014-09-07 ENCOUNTER — Encounter (HOSPITAL_COMMUNITY)
Admission: AD | Disposition: A | Discharge status: Home / Self Care | Payer: Self-pay | Source: Ambulatory Visit | Attending: Neurosurgery

## 2014-09-07 ENCOUNTER — Encounter (HOSPITAL_COMMUNITY): Payer: Self-pay | Admitting: General Practice

## 2014-09-07 ENCOUNTER — Inpatient Hospital Stay (HOSPITAL_COMMUNITY): Payer: BLUE CROSS/BLUE SHIELD | Admitting: Anesthesiology

## 2014-09-07 DIAGNOSIS — R1310 Dysphagia, unspecified: Secondary | ICD-10-CM | POA: Diagnosis present

## 2014-09-07 DIAGNOSIS — F17210 Nicotine dependence, cigarettes, uncomplicated: Secondary | ICD-10-CM | POA: Diagnosis present

## 2014-09-07 DIAGNOSIS — E785 Hyperlipidemia, unspecified: Secondary | ICD-10-CM | POA: Diagnosis present

## 2014-09-07 DIAGNOSIS — S14109A Unspecified injury at unspecified level of cervical spinal cord, initial encounter: Secondary | ICD-10-CM | POA: Diagnosis present

## 2014-09-07 DIAGNOSIS — K219 Gastro-esophageal reflux disease without esophagitis: Secondary | ICD-10-CM | POA: Diagnosis present

## 2014-09-07 DIAGNOSIS — T148XXA Other injury of unspecified body region, initial encounter: Secondary | ICD-10-CM

## 2014-09-07 DIAGNOSIS — I10 Essential (primary) hypertension: Secondary | ICD-10-CM | POA: Diagnosis present

## 2014-09-07 DIAGNOSIS — F329 Major depressive disorder, single episode, unspecified: Secondary | ICD-10-CM | POA: Diagnosis present

## 2014-09-07 DIAGNOSIS — K589 Irritable bowel syndrome without diarrhea: Secondary | ICD-10-CM | POA: Diagnosis present

## 2014-09-07 DIAGNOSIS — T84226A Displacement of internal fixation device of vertebrae, initial encounter: Principal | ICD-10-CM | POA: Diagnosis present

## 2014-09-07 DIAGNOSIS — F419 Anxiety disorder, unspecified: Secondary | ICD-10-CM | POA: Diagnosis present

## 2014-09-07 DIAGNOSIS — Z419 Encounter for procedure for purposes other than remedying health state, unspecified: Secondary | ICD-10-CM

## 2014-09-07 HISTORY — DX: Essential (primary) hypertension: I10

## 2014-09-07 HISTORY — DX: Unspecified chronic bronchitis: J42

## 2014-09-07 HISTORY — PX: ANTERIOR CERVICAL DECOMP/DISCECTOMY FUSION: SHX1161

## 2014-09-07 SURGERY — ANTERIOR CERVICAL DECOMPRESSION/DISCECTOMY FUSION 1 LEVEL/HARDWARE REMOVAL
Anesthesia: General | Site: Spine Cervical

## 2014-09-07 MED ORDER — VANCOMYCIN HCL IN DEXTROSE 1-5 GM/200ML-% IV SOLN
1000.0000 mg | Freq: Once | INTRAVENOUS | Status: DC
  Filled 2014-09-07: qty 200

## 2014-09-07 MED ORDER — MIDAZOLAM HCL 2 MG/2ML IJ SOLN
INTRAMUSCULAR | Status: AC
  Filled 2014-09-07: qty 2

## 2014-09-07 MED ORDER — ONDANSETRON HCL 4 MG/2ML IJ SOLN
INTRAMUSCULAR | Status: AC
  Filled 2014-09-07: qty 2

## 2014-09-07 MED ORDER — 0.9 % SODIUM CHLORIDE (POUR BTL) OPTIME
TOPICAL | Status: DC | PRN
  Administered 2014-09-07: 1000 mL

## 2014-09-07 MED ORDER — PHENOL 1.4 % MT LIQD: 1.0000 | OROMUCOSAL | Status: DC | PRN

## 2014-09-07 MED ORDER — DOCUSATE SODIUM 100 MG PO CAPS
100.0000 mg | ORAL_CAPSULE | Freq: Two times a day (BID) | ORAL | Status: DC
  Administered 2014-09-07 – 2014-09-10 (×6): 100 mg via ORAL
  Filled 2014-09-07 (×6): qty 1

## 2014-09-07 MED ORDER — DIAZEPAM 5 MG PO TABS
5.0000 mg | ORAL_TABLET | Freq: Four times a day (QID) | ORAL | Status: DC | PRN
  Administered 2014-09-07: 5 mg via ORAL

## 2014-09-07 MED ORDER — PROMETHAZINE HCL 25 MG/ML IJ SOLN: 6.2500 mg | INTRAMUSCULAR | Status: DC | PRN

## 2014-09-07 MED ORDER — DEXAMETHASONE SODIUM PHOSPHATE 4 MG/ML IJ SOLN
4.0000 mg | Freq: Four times a day (QID) | INTRAMUSCULAR | Status: AC
  Administered 2014-09-07 – 2014-09-08 (×2): 4 mg via INTRAVENOUS
  Filled 2014-09-07 (×2): qty 1

## 2014-09-07 MED ORDER — HYDROCODONE-ACETAMINOPHEN 5-325 MG PO TABS
1.0000 | ORAL_TABLET | ORAL | Status: DC | PRN
  Administered 2014-09-08: 1 via ORAL
  Filled 2014-09-07: qty 1

## 2014-09-07 MED ORDER — HEMOSTATIC AGENTS (NO CHARGE) OPTIME
TOPICAL | Status: DC | PRN
  Administered 2014-09-07: 1 via TOPICAL

## 2014-09-07 MED ORDER — OXYCODONE HCL 5 MG PO TABS
5.0000 mg | ORAL_TABLET | Freq: Once | ORAL | Status: AC | PRN
  Administered 2014-09-07: 5 mg via ORAL

## 2014-09-07 MED ORDER — DIAZEPAM 5 MG PO TABS
ORAL_TABLET | ORAL | Status: AC
  Administered 2014-09-07: 5 mg via ORAL
  Filled 2014-09-07: qty 1

## 2014-09-07 MED ORDER — PANTOPRAZOLE SODIUM 40 MG PO TBEC
80.0000 mg | DELAYED_RELEASE_TABLET | Freq: Every day | ORAL | Status: DC
  Administered 2014-09-08 – 2014-09-10 (×3): 80 mg via ORAL
  Filled 2014-09-07 (×4): qty 2

## 2014-09-07 MED ORDER — VANCOMYCIN HCL 1000 MG IV SOLR
1000.0000 mg | INTRAVENOUS | Status: DC | PRN
  Administered 2014-09-07: 1000 mg via INTRAVENOUS

## 2014-09-07 MED ORDER — LORAZEPAM 0.5 MG PO TABS
0.5000 mg | ORAL_TABLET | Freq: Every day | ORAL | Status: DC
  Administered 2014-09-07 – 2014-09-09 (×3): 0.5 mg via ORAL
  Filled 2014-09-07 (×3): qty 1

## 2014-09-07 MED ORDER — SERTRALINE HCL 50 MG PO TABS
50.0000 mg | ORAL_TABLET | Freq: Every day | ORAL | Status: DC
  Administered 2014-09-07 – 2014-09-09 (×3): 50 mg via ORAL
  Filled 2014-09-07 (×3): qty 1

## 2014-09-07 MED ORDER — SUCCINYLCHOLINE CHLORIDE 20 MG/ML IJ SOLN
INTRAMUSCULAR | Status: DC | PRN
  Administered 2014-09-07: 100 mg via INTRAVENOUS

## 2014-09-07 MED ORDER — BACITRACIN ZINC 500 UNIT/GM EX OINT
TOPICAL_OINTMENT | CUTANEOUS | Status: DC | PRN
  Administered 2014-09-07: 1 via TOPICAL

## 2014-09-07 MED ORDER — FENTANYL CITRATE 0.05 MG/ML IJ SOLN
INTRAMUSCULAR | Status: DC | PRN
  Administered 2014-09-07 (×3): 50 ug via INTRAVENOUS
  Administered 2014-09-07: 100 ug via INTRAVENOUS

## 2014-09-07 MED ORDER — GLYCOPYRROLATE 0.2 MG/ML IJ SOLN
INTRAMUSCULAR | Status: AC
  Filled 2014-09-07: qty 3

## 2014-09-07 MED ORDER — LACTATED RINGERS IV SOLN
INTRAVENOUS | Status: DC | PRN
  Administered 2014-09-07 (×2): via INTRAVENOUS

## 2014-09-07 MED ORDER — HYDROMORPHONE HCL 1 MG/ML IJ SOLN
0.2500 mg | INTRAMUSCULAR | Status: DC | PRN
  Administered 2014-09-07 (×2): 0.5 mg via INTRAVENOUS

## 2014-09-07 MED ORDER — FENTANYL CITRATE 0.05 MG/ML IJ SOLN
INTRAMUSCULAR | Status: AC
  Filled 2014-09-07: qty 5

## 2014-09-07 MED ORDER — ROCURONIUM BROMIDE 50 MG/5ML IV SOLN
INTRAVENOUS | Status: AC
  Filled 2014-09-07: qty 1

## 2014-09-07 MED ORDER — ONDANSETRON HCL 4 MG/2ML IJ SOLN
INTRAMUSCULAR | Status: DC | PRN
  Administered 2014-09-07: 4 mg via INTRAVENOUS

## 2014-09-07 MED ORDER — DICYCLOMINE HCL 10 MG PO CAPS
10.0000 mg | ORAL_CAPSULE | Freq: Three times a day (TID) | ORAL | Status: DC
Start: 2014-09-08 — End: 2014-09-08
  Filled 2014-09-07 (×5): qty 1

## 2014-09-07 MED ORDER — ROCURONIUM BROMIDE 100 MG/10ML IV SOLN
INTRAVENOUS | Status: DC | PRN
  Administered 2014-09-07: 20 mg via INTRAVENOUS

## 2014-09-07 MED ORDER — MORPHINE SULFATE 2 MG/ML IJ SOLN
1.0000 mg | INTRAMUSCULAR | Status: DC | PRN
Start: 2014-09-07 — End: 2014-09-10

## 2014-09-07 MED ORDER — PROPOFOL 10 MG/ML IV BOLUS
INTRAVENOUS | Status: AC
  Filled 2014-09-07: qty 20

## 2014-09-07 MED ORDER — ACETAMINOPHEN 650 MG RE SUPP: 650.0000 mg | RECTAL | Status: DC | PRN

## 2014-09-07 MED ORDER — PHENYLEPHRINE HCL 10 MG/ML IJ SOLN
INTRAMUSCULAR | Status: DC | PRN
  Administered 2014-09-07 (×2): 120 ug via INTRAVENOUS
  Administered 2014-09-07: 80 ug via INTRAVENOUS

## 2014-09-07 MED ORDER — LIDOCAINE HCL (CARDIAC) 20 MG/ML IV SOLN
INTRAVENOUS | Status: AC
  Filled 2014-09-07: qty 5

## 2014-09-07 MED ORDER — ALUM & MAG HYDROXIDE-SIMETH 200-200-20 MG/5ML PO SUSP: 30.0000 mL | Freq: Four times a day (QID) | ORAL | Status: DC | PRN

## 2014-09-07 MED ORDER — DEXAMETHASONE 4 MG PO TABS
4.0000 mg | ORAL_TABLET | Freq: Four times a day (QID) | ORAL | Status: AC
  Administered 2014-09-08: 4 mg via ORAL
  Filled 2014-09-07: qty 1

## 2014-09-07 MED ORDER — GLYCOPYRROLATE 0.2 MG/ML IJ SOLN
INTRAMUSCULAR | Status: DC | PRN
  Administered 2014-09-07: 0.4 mg via INTRAVENOUS

## 2014-09-07 MED ORDER — VANCOMYCIN HCL IN DEXTROSE 1-5 GM/200ML-% IV SOLN
INTRAVENOUS | Status: AC
  Filled 2014-09-07: qty 200

## 2014-09-07 MED ORDER — SODIUM CHLORIDE 0.9 % IR SOLN
Status: DC | PRN
  Administered 2014-09-07: 500 mL

## 2014-09-07 MED ORDER — PROPOFOL 10 MG/ML IV BOLUS
INTRAVENOUS | Status: DC | PRN
  Administered 2014-09-07: 110 mg via INTRAVENOUS

## 2014-09-07 MED ORDER — DEXAMETHASONE SODIUM PHOSPHATE 10 MG/ML IJ SOLN
INTRAMUSCULAR | Status: DC | PRN
  Administered 2014-09-07: 10 mg via INTRAVENOUS

## 2014-09-07 MED ORDER — LISINOPRIL 5 MG PO TABS
5.0000 mg | ORAL_TABLET | Freq: Every day | ORAL | Status: DC
  Administered 2014-09-08 – 2014-09-10 (×3): 5 mg via ORAL
  Filled 2014-09-07 (×3): qty 1

## 2014-09-07 MED ORDER — ACETAMINOPHEN 325 MG PO TABS
ORAL_TABLET | ORAL | Status: AC
  Administered 2014-09-07: 650 mg
  Filled 2014-09-07: qty 2

## 2014-09-07 MED ORDER — ACETAMINOPHEN 325 MG PO TABS: 650.0000 mg | ORAL_TABLET | ORAL | Status: DC | PRN

## 2014-09-07 MED ORDER — LIDOCAINE HCL (CARDIAC) 20 MG/ML IV SOLN
INTRAVENOUS | Status: DC | PRN
  Administered 2014-09-07: 60 mg via INTRAVENOUS

## 2014-09-07 MED ORDER — FLUTICASONE PROPIONATE HFA 44 MCG/ACT IN AERO
1.0000 | INHALATION_SPRAY | Freq: Two times a day (BID) | RESPIRATORY_TRACT | Status: DC
  Administered 2014-09-08 – 2014-09-10 (×4): 1 via RESPIRATORY_TRACT
  Filled 2014-09-07 (×2): qty 10.6

## 2014-09-07 MED ORDER — OXYCODONE HCL 5 MG PO TABS
ORAL_TABLET | ORAL | Status: AC
  Administered 2014-09-07: 5 mg via ORAL
  Filled 2014-09-07: qty 1

## 2014-09-07 MED ORDER — NEOSTIGMINE METHYLSULFATE 10 MG/10ML IV SOLN
INTRAVENOUS | Status: DC | PRN
  Administered 2014-09-07: 3 mg via INTRAVENOUS

## 2014-09-07 MED ORDER — ALBUTEROL SULFATE (2.5 MG/3ML) 0.083% IN NEBU
3.0000 mL | INHALATION_SOLUTION | Freq: Four times a day (QID) | RESPIRATORY_TRACT | Status: DC | PRN
Start: 2014-09-07 — End: 2014-09-10

## 2014-09-07 MED ORDER — MENTHOL 3 MG MT LOZG: 1.0000 | LOZENGE | OROMUCOSAL | Status: DC | PRN

## 2014-09-07 MED ORDER — OXYCODONE-ACETAMINOPHEN 5-325 MG PO TABS
1.0000 | ORAL_TABLET | ORAL | Status: DC | PRN
  Administered 2014-09-07: 2 via ORAL
  Administered 2014-09-08 – 2014-09-09 (×4): 1 via ORAL
  Filled 2014-09-07 (×4): qty 1
  Filled 2014-09-07: qty 2
  Filled 2014-09-07: qty 1

## 2014-09-07 MED ORDER — ONDANSETRON HCL 4 MG/2ML IJ SOLN
4.0000 mg | INTRAMUSCULAR | Status: DC | PRN
  Filled 2014-09-07: qty 2

## 2014-09-07 MED ORDER — ARTIFICIAL TEARS OP OINT
TOPICAL_OINTMENT | OPHTHALMIC | Status: DC | PRN
  Administered 2014-09-07: 1 via OPHTHALMIC

## 2014-09-07 MED ORDER — MIDAZOLAM HCL 5 MG/5ML IJ SOLN
INTRAMUSCULAR | Status: DC | PRN
  Administered 2014-09-07: 2 mg via INTRAVENOUS

## 2014-09-07 MED ORDER — ATORVASTATIN CALCIUM 40 MG PO TABS
40.0000 mg | ORAL_TABLET | Freq: Every day | ORAL | Status: DC
  Administered 2014-09-08 – 2014-09-09 (×2): 40 mg via ORAL
  Filled 2014-09-07 (×2): qty 1

## 2014-09-07 MED ORDER — HYDROMORPHONE HCL 1 MG/ML IJ SOLN
INTRAMUSCULAR | Status: AC
  Administered 2014-09-07: 0.5 mg via INTRAVENOUS
  Filled 2014-09-07: qty 1

## 2014-09-07 MED ORDER — LACTATED RINGERS IV SOLN
INTRAVENOUS | Status: DC
  Administered 2014-09-08: 05:00:00 via INTRAVENOUS

## 2014-09-07 MED ORDER — THROMBIN 5000 UNITS EX SOLR
CUTANEOUS | Status: DC | PRN
  Administered 2014-09-07 (×2): 5000 [IU] via TOPICAL

## 2014-09-07 MED ORDER — OXYCODONE HCL 5 MG/5ML PO SOLN: 5.0000 mg | Freq: Once | ORAL | Status: AC | PRN

## 2014-09-07 SURGICAL SUPPLY — 59 items
BAG DECANTER FOR FLEXI CONT (MISCELLANEOUS) ×2 IMPLANT
BENZOIN TINCTURE PRP APPL 2/3 (GAUZE/BANDAGES/DRESSINGS) ×2 IMPLANT
BIT DRILL NEURO 2X3.1 SFT TUCH (MISCELLANEOUS) IMPLANT
BLADE SURG 15 STRL LF DISP TIS (BLADE) IMPLANT
BLADE SURG 15 STRL SS (BLADE)
BLADE ULTRA TIP 2M (BLADE) IMPLANT
BRUSH SCRUB EZ PLAIN DRY (MISCELLANEOUS) ×2 IMPLANT
BUR BARREL STRAIGHT FLUTE 4.0 (BURR) IMPLANT
BUR MATCHSTICK NEURO 3.0 LAGG (BURR) IMPLANT
CANISTER SUCT 3000ML (MISCELLANEOUS) ×2 IMPLANT
CONT SPEC 4OZ CLIKSEAL STRL BL (MISCELLANEOUS) ×2 IMPLANT
COVER MAYO STAND STRL (DRAPES) ×2 IMPLANT
DRAIN JACKSON PRATT 10MM FLAT (MISCELLANEOUS) ×2 IMPLANT
DRAIN SNY WOU 7FLT (WOUND CARE) IMPLANT
DRAPE LAPAROTOMY 100X72 PEDS (DRAPES) ×2 IMPLANT
DRAPE MICROSCOPE LEICA (MISCELLANEOUS) IMPLANT
DRAPE POUCH INSTRU U-SHP 10X18 (DRAPES) ×2 IMPLANT
DRAPE SURG 17X23 STRL (DRAPES) ×4 IMPLANT
DRILL NEURO 2X3.1 SOFT TOUCH (MISCELLANEOUS)
ELECT REM PT RETURN 9FT ADLT (ELECTROSURGICAL) ×2
ELECTRODE REM PT RTRN 9FT ADLT (ELECTROSURGICAL) ×1 IMPLANT
EVACUATOR SILICONE 100CC (DRAIN) ×2 IMPLANT
GAUZE SPONGE 4X4 12PLY STRL (GAUZE/BANDAGES/DRESSINGS) ×2 IMPLANT
GAUZE SPONGE 4X4 16PLY XRAY LF (GAUZE/BANDAGES/DRESSINGS) IMPLANT
GLOVE BIO SURGEON STRL SZ 6.5 (GLOVE) ×2 IMPLANT
GLOVE BIO SURGEON STRL SZ8.5 (GLOVE) ×2 IMPLANT
GLOVE BIOGEL PI IND STRL 6.5 (GLOVE) ×2 IMPLANT
GLOVE BIOGEL PI INDICATOR 6.5 (GLOVE) ×2
GLOVE EXAM NITRILE LRG STRL (GLOVE) IMPLANT
GLOVE EXAM NITRILE MD LF STRL (GLOVE) IMPLANT
GLOVE EXAM NITRILE XL STR (GLOVE) IMPLANT
GLOVE EXAM NITRILE XS STR PU (GLOVE) IMPLANT
GLOVE SS BIOGEL STRL SZ 8 (GLOVE) ×2 IMPLANT
GLOVE SUPERSENSE BIOGEL SZ 8 (GLOVE) ×2
GLOVE SURG SS PI 7.0 STRL IVOR (GLOVE) ×2 IMPLANT
GOWN STRL REUS W/ TWL LRG LVL3 (GOWN DISPOSABLE) ×1 IMPLANT
GOWN STRL REUS W/ TWL XL LVL3 (GOWN DISPOSABLE) ×1 IMPLANT
GOWN STRL REUS W/TWL LRG LVL3 (GOWN DISPOSABLE) ×1
GOWN STRL REUS W/TWL XL LVL3 (GOWN DISPOSABLE) ×1
KIT BASIN OR (CUSTOM PROCEDURE TRAY) ×2 IMPLANT
KIT ROOM TURNOVER OR (KITS) ×2 IMPLANT
MARKER SKIN DUAL TIP RULER LAB (MISCELLANEOUS) IMPLANT
NEEDLE HYPO 22GX1.5 SAFETY (NEEDLE) IMPLANT
NEEDLE SPNL 18GX3.5 QUINCKE PK (NEEDLE) IMPLANT
NS IRRIG 1000ML POUR BTL (IV SOLUTION) ×2 IMPLANT
PACK LAMINECTOMY NEURO (CUSTOM PROCEDURE TRAY) ×2 IMPLANT
PIN DISTRACTION 14MM (PIN) ×4 IMPLANT
RUBBERBAND STERILE (MISCELLANEOUS) IMPLANT
SPONGE INTESTINAL PEANUT (DISPOSABLE) ×2 IMPLANT
SPONGE SURGIFOAM ABS GEL SZ50 (HEMOSTASIS) ×2 IMPLANT
STRIP CLOSURE SKIN 1/2X4 (GAUZE/BANDAGES/DRESSINGS) ×2 IMPLANT
SUT VIC AB 0 CT1 27 (SUTURE) ×1
SUT VIC AB 0 CT1 27XBRD ANTBC (SUTURE) ×1 IMPLANT
SUT VIC AB 3-0 SH 8-18 (SUTURE) ×2 IMPLANT
SYR 20ML ECCENTRIC (SYRINGE) ×2 IMPLANT
TAPE CLOTH SURG 4X10 WHT LF (GAUZE/BANDAGES/DRESSINGS) ×2 IMPLANT
TOWEL OR 17X24 6PK STRL BLUE (TOWEL DISPOSABLE) ×2 IMPLANT
TOWEL OR 17X26 10 PK STRL BLUE (TOWEL DISPOSABLE) ×2 IMPLANT
WATER STERILE IRR 1000ML POUR (IV SOLUTION) ×2 IMPLANT

## 2014-09-07 NOTE — Transfer of Care (Signed)
Immediate Anesthesia Transfer of Care Note  Patient: Leah [name]  Procedure(s) Performed: Procedure(s): Exploration of Fusion and Removal of Anterior Cervical Hematoma (N/A)  Patient Location: PACU  Anesthesia Type:General  Level of Consciousness: awake, alert  and oriented  Airway & Oxygen Therapy: Patient Spontanous Breathing and Patient connected to nasal cannula oxygen  Post-op Assessment: Report given to RN, Post -op Vital signs reviewed and stable and Patient moving all extremities  Post vital signs: Reviewed and stable  Last Vitals:  Filed Vitals:   09/07/14 1638  BP: 137/65  Pulse: 87  Temp: 37.4 C  Resp: 20    Complications: No apparent anesthesia complications

## 2014-09-07 NOTE — Progress Notes (Signed)
Attempted to call office again for orders. Patient arrived at 1300. Per patient. Dr. Arnoldo Morale is operating on her today at 1600. Patient advised not drink or eat.   Ave Filter, RN

## 2014-09-07 NOTE — Op Note (Signed)
Brief history: The patient is a 69 year old white female on whom I performed a C5-6 and C6-7 anterior cervical discectomy, fusion, and plating 4 days ago. The patient initially did well but developed progressive dysphagia. She was seen in the office and diagnosed with a cervical hematoma. I discussed the situation with the patient and her husband and recommend that she be admitted and undergo evacuation of her cervical hematoma. The patient has weighed the risks, benefits, and alternatives surgery and decided to proceed with the operation.  Preoperative diagnosis: Cervical hematoma, displaced interbody prosthesis  Postoperative diagnosis: The same  Procedure: Evacuation of cervical hematoma, revision of cervical instrumentation  Surgeon: Dr. Earle Gell  Assistant: None  Anesthesia: Gen. endotracheal  Estimated blood loss: Minimal  Specimens: None  Drains: One large Jackson-Pratt drain in the prevertebral space  Complications: None  Description of procedure: The patient was brought to the operating room by the anesthesia team. General endotracheal anesthesia was induced. The patient remained in the supine position. I placed a roll under her shoulders to keep her neck in the neutral position. The patient's anterior cervical region was then prepared with Betadine scrub and Betadine solution. Sterile drapes were applied. I then used a scalpel to make an incision through the patient's prior fresh surgical scar. I used Metzenbaum scissors to divide the platysma muscle. I then dissected medial to the sternocleidomastoid muscle, jugular vein, carotid artery. Upon doing this there was release of liquefied hematoma under some pressure. I then dissected down towards the intracervical spine and exposed the hardware from the prior operation. I inserted the Caspar retractor for exposure. I removed the hematoma using suction and irrigation. I did not see any active bleeding points. I inspected the esophagus  appeared intact.  On the x-ray we got in the office today the patient's interbody prosthesis at C6-7 was minimally posterior displaced. Since we were going to be in the OR removing the hematoma I recommended that we move the prosthesis back. I unlock the cans, remove the screws then removed the plate. I inserted distraction screws into C6 and C7. I distracted the interspace and then pulled the prosthesis anteriorly with a nerve hook. We obtained intraoperative radiograph which demonstrated good position of the interbody prosthesis. I then replaced the patient's intracervical plate and secured it with 2 12 mm self-tapping screws at C5, C6 and C7. We got good bony purchase. We obtained another x-ray which demonstrated good position of the instrumentation. I therefore secured the screws to the plate of the plate by blocking each cam. I irrigated the wound out with bacitracin solution. We obtained hemostasis using bipolar cautery. I placed a 10 mm flat Jackson-Pratt drain in the prevertebral space and tunneled it out through a separate stab wound. I removed the retractor. We reapproximated the platysma muscle with interrupted 3-0 Vicryl suture. We reapproximated the subcutaneous tissue with interrupted 3-0 Vicryl suture. We reapproximated the skin with Steri-Strips and benzoin. We then coated the wound with bacitracin ointment. A sterile dressing was applied. The drapes were removed. The patient was subsequently extubated by the anesthesia team and transported to the post anesthesia care unit in stable condition. By report all sponge, instrument, and needle counts were correct at the end of this case.

## 2014-09-07 NOTE — H&P (Signed)
Subjective: The patient is a 69 year old white female on whom I performed a C5-6 and C6-7 anterior cervical discectomy, fusion, and plating 4 days ago at the [facility]. The patient has developed dysphagia. She was evaluated in my office today and noted to have a cervical hematoma. I have recommended that the patient be admitted and undergo a vacuum relation of cervical hematoma. The patient consents.  The patient presently complains of dysphagia. She is having no trouble breathing.   Past Medical History  Diagnosis Date  . Esophageal spasm   . GERD (gastroesophageal reflux disease)   . Chronic gastritis   . Diverticulosis   . B12 deficiency   . IBS (irritable bowel syndrome)   . Anxiety   . Depression   . Hiatal hernia   . Iron deficiency anemia   . Hyperlipidemia   . Diverticulitis   . Barrett esophagus   . Rosacea   . Interstitial cystitis   . Hypertension   . Hyperlipidemia   . Chronic bronchitis     Past Surgical History  Procedure Laterality Date  . Tonsillectomy and adenoidectomy    . Tmj arthroplasty    . Vocal cord polypectomy    . Carpal tunnel release  1993    right  . Abdominal hysterectomy    . Cholecystectomy    . Partial colectomy    . Sinus surgery with instatrak    . Laparoscopic appendectomy N/A 09/21/2013    Procedure: APPENDECTOMY LAPAROSCOPIC;  Surgeon: Rolm Bookbinder, MD;  Location: [facility];  Service: General;  Laterality: N/A;  . Anterior fusion cervical spine  09/03/2014    Allergies  Allergen Reactions  . Amoxicillin-Pot Clavulanate Shortness Of Breath, Itching and Swelling  . Ciprofloxacin Shortness Of Breath, Itching and Swelling  . Moxifloxacin Shortness Of Breath, Itching and Swelling  . Penicillins Shortness Of Breath, Itching and Swelling  . Sulfonamide Derivatives Shortness Of Breath, Itching and Swelling  . Prednisone Other (See Comments)    GI irritation; is able to tolerate injections    History   Substance Use Topics  . Smoking status: Current Every Day Smoker -- 0.50 packs/day for 45 years    Types: Cigarettes  . Smokeless tobacco: Never Used  . Alcohol Use: No    Family History  Problem Relation Age of Onset  . Colon cancer Neg Hx   . Pancreatic cancer Mother   . Pancreatic cancer Father   . Colon polyps Mother   . Diabetes Father   . Heart disease Father   . Leukemia Father   . Prostate cancer Father   . Hypertension Father    Prior to Admission medications   Medication Sig Start Date End Date Taking? Authorizing Provider  acetaminophen (TYLENOL) 500 MG tablet Take 500 mg by mouth every 6 (six) hours as needed.      Historical Provider, MD  albuterol (PROAIR HFA) 108 (90 BASE) MCG/ACT inhaler Inhale 2 puffs into the lungs every 6 (six) hours as needed for wheezing. 01/10/13   Rosalita Chessman, DO  atorvastatin (LIPITOR) 40 MG tablet Take 1 tablet (40 mg total) by mouth daily at 6 PM. 08/12/14 08/13/15  Rosalita Chessman, DO  beclomethasone (QVAR) 80 MCG/ACT inhaler Inhale 2 puffs into the lungs 2 (two) times daily. 01/28/14   Midge Minium, MD  cyclobenzaprine (FLEXERIL) 10 MG tablet Take 1 tablet (10 mg total) by mouth 3 (three) times daily as needed for muscle spasms. 07/20/14   Rosalita Chessman, DO  dicyclomine (BENTYL) 10 MG capsule Take 1 capsule by mouth three times daily as needed for cramping and spasms. 02/25/14   Lafayette Dragon, MD  esomeprazole (NEXIUM) 40 MG capsule Take 1 capsule (40 mg total) by mouth 2 (two) times daily before a meal. 08/20/14   Rosalita Chessman, DO  estradiol (VIVELLE-DOT) 0.1 MG/24HR Place 1 patch onto the skin 2 (two) times a week.      Historical Provider, MD  HYDROcodone-acetaminophen (NORCO/VICODIN) 5-325 MG per tablet Take 1 tablet by mouth every 6 (six) hours as needed for moderate pain. 08/04/14   Rosalita Chessman, DO  ibuprofen (ADVIL,MOTRIN) 800 MG tablet Take 1 tablet (800 mg total) by mouth 3 (three) times daily. 05/08/14   Fransico Meadow, PA-C   lisinopril (PRINIVIL,ZESTRIL) 5 MG tablet  05/05/14   Historical Provider, MD  LORazepam (ATIVAN) 0.5 MG tablet Take 0.5 mg by mouth at bedtime. At bedtime     Historical Provider, MD  sertraline (ZOLOFT) 50 MG tablet Take 50 mg by mouth at bedtime.    Historical Provider, MD     Review of Systems  Positive ROS: As above  All other systems have been reviewed and were otherwise negative with the exception of those mentioned in the HPI and as above.  Objective: Vital signs in last 24 hours: Temp:  [99.4 F (37.4 C)] 99.4 F (37.4 C) (02/08 1638) Pulse Rate:  [79-87] 87 (02/08 1638) Resp:  [16-20] 20 (02/08 1638) BP: (129-137)/(65-75) 137/65 mmHg (02/08 1638) SpO2:  [92 %-95 %] 95 % (02/08 1638) Weight:  [52 kg (114 lb 10.2 oz)] 52 kg (114 lb 10.2 oz) (02/08 1333)  General Appearance: Alert, cooperative, no distress, no stridor, no dyspnea HEENT: Unremarkable  Neck: The patient has a left cervical hematoma. Is no significant midline shift. She is not stridorous.  Neurologic exam: The patient is alert and oriented 3. Her strength is normal in all 4 extremities.  Thorax: Symmetric  Abdomen: Soft  Heart: Regular rhythm   Data Review Lab Results  Component Value Date   WBC 6.1 08/04/2014   HGB 13.4 08/04/2014   HCT 40.8 08/04/2014   MCV 89.4 08/04/2014   PLT 226.0 08/04/2014   Lab Results  Component Value Date   NA 139 08/04/2014   K 4.1 08/04/2014   CL 105 08/04/2014   CO2 27 08/04/2014   BUN 13 08/04/2014   CREATININE 0.8 08/04/2014   GLUCOSE 91 08/04/2014   Lab Results  Component Value Date   INR 1.0 05/21/2009    Assessment/Plan: Cervical hematoma: I have discussed the situation with the patient and her husband. I recommended that we admit her and perform an evacuation of the cervical hematoma. I have explained the surgery to her. We have discussed the risks, benefits, and alternatives to surgery. I have answered all the patient's, and her husbands,  questions. We have elected to proceed with surgery.   Ophelia Charter 09/07/2014 6:19 PM

## 2014-09-07 NOTE — Progress Notes (Signed)
Patient is a direct admit from MD office. Safety precautions reviewed with patient/family. She appears in no distress. MD office paged for order. RN asked patient not to eat or drink at this time until order received. Patient/family verbalized understanding. Left message with Manuela Schwartz at Dr. Arnoldo Morale' office.   Ave Filter, RN

## 2014-09-07 NOTE — Progress Notes (Signed)
Call MD office again and spoke to [place]. She already relayed to MD that patient arrive. No orders noted in computer. Per Manuela Schwartz, patient to remain NPO and lab per anesthesia. Patient notified.  Ave Filter, RN

## 2014-09-07 NOTE — Progress Notes (Signed)
Patient left for the OR at this time. Consent signed for evacuation cervical hematoma with possible revision of hardward. Family at bedside.   Ave Filter, RN

## 2014-09-07 NOTE — Anesthesia Postprocedure Evaluation (Signed)
  Anesthesia Post-op Note  Patient: Leah [name]  Procedure(s) Performed: Procedure(s): Exploration of Fusion and Removal of Anterior Cervical Hematoma (N/A)  Patient Location: PACU  Anesthesia Type:General  Level of Consciousness: awake and alert   Airway and Oxygen Therapy: Patient Spontanous Breathing  Post-op Pain: none  Post-op Assessment: Post-op Vital signs reviewed  Post-op Vital Signs: Reviewed  Last Vitals:  Filed Vitals:   09/07/14 2200  BP: 122/66  Pulse: 80  Temp: 36.8 C  Resp: 12    Complications: No apparent anesthesia complications

## 2014-09-07 NOTE — Progress Notes (Signed)
Subjective:  The patient is somnolent but easily arousable. She is in no apparent distress.  Objective: Vital signs in last 24 hours: Temp:  [99.4 F (37.4 C)] 99.4 F (37.4 C) (02/08 1638) Pulse Rate:  [79-87] 87 (02/08 1638) Resp:  [16-20] 20 (02/08 1638) BP: (129-137)/(65-75) 137/65 mmHg (02/08 1638) SpO2:  [92 %-95 %] 95 % (02/08 1937) FiO2 (%):  [21 %] 21 % (02/08 1937) Weight:  [52 kg (114 lb 10.2 oz)] 52 kg (114 lb 10.2 oz) (02/08 1333)  Intake/Output from previous day:   Intake/Output this shift: Total I/O In: 1200 [I.V.:1200] Out: -   Physical exam the patient is somnolent but easily arousable. She is moving all 4 extremities well. Her dressing is clean and dry. There is no evidence of hematoma or shift.  Lab Results: No results for input(s): WBC, HGB, HCT, PLT in the last 72 hours. BMET No results for input(s): NA, K, CL, CO2, GLUCOSE, BUN, CREATININE, CALCIUM in the last 72 hours.  Studies/Results: Dg Cervical Spine 2-3 Views  09/07/2014   CLINICAL DATA:  Exploration of fusion and removal of anterior cervical hematoma.  EXAM: CERVICAL SPINE - 2-3 VIEW  COMPARISON:  09/03/2014; radiographs from 09/07/2014  FINDINGS: A series of 3 portable lateral intraoperative radiographs are provided.  The first demonstrates tissue spreaders in place at the level of the C4-C5-C6 ACDF.  The second image demonstrates bony interbody graft but apparent removal of the plate and screw fixator. A C5 and a C6 screw are present.  The final image demonstrates a plate screw fixator in place, with prevertebral sponge material and expected alignment and appearance.  IMPRESSION: 1. The series of 3 portable spot images in this case appear to demonstrate hardware replacement in the context of the anterior cervical hematoma evacuation.   Electronically Signed   By: Sherryl Barters M.D.   On: 09/07/2014 20:41    Assessment/Plan: The patient is doing well.  LOS: 0 days     Hadiya Spoerl  D 09/07/2014, 8:51 PM

## 2014-09-07 NOTE — Anesthesia Preprocedure Evaluation (Addendum)
Anesthesia Evaluation  Patient identified by MRN, date of birth, ID band Patient awake    Reviewed: Allergy & Precautions, NPO status , Patient's Chart, lab work & pertinent test results  Airway Mallampati: IV  TM Distance: >3 FB Neck ROM: Limited  Mouth opening: Limited Mouth Opening  Dental  (+) Teeth Intact, Dental Advisory Given   Pulmonary COPDCurrent Smoker,          Cardiovascular hypertension, Pt. on medications     Neuro/Psych Anxiety Depression  Neuromuscular disease    GI/Hepatic Neg liver ROS, hiatal hernia, GERD-  ,  Endo/Other  negative endocrine ROS  Renal/GU negative Renal ROS     Musculoskeletal   Abdominal   Peds  Hematology negative hematology ROS (+) anemia ,   Anesthesia Other Findings   Reproductive/Obstetrics                           Anesthesia Physical Anesthesia Plan  ASA: III  Anesthesia Plan: General   Post-op Pain Management:    Induction: Intravenous  Airway Management Planned: Oral ETT  Additional Equipment:   Intra-op Plan:   Post-operative Plan: Extubation in OR  Informed Consent: I have reviewed the patients History and Physical, chart, labs and discussed the procedure including the risks, benefits and alternatives for the proposed anesthesia with the patient or authorized representative who has indicated his/her understanding and acceptance.   Dental advisory given  Plan Discussed with: CRNA  Anesthesia Plan Comments:         Anesthesia Quick Evaluation

## 2014-09-07 NOTE — Anesthesia Procedure Notes (Signed)
Procedure Name: Intubation Date/Time: 09/07/2014 7:22 PM Performed by: Trixie Deis A Pre-anesthesia Checklist: Patient identified, Timeout performed, Emergency Drugs available, Suction available and Patient being monitored Patient Re-evaluated:Patient Re-evaluated prior to inductionOxygen Delivery Method: Circle system utilized Preoxygenation: Pre-oxygenation with 100% oxygen Intubation Type: IV induction Ventilation: Mask ventilation without difficulty Grade View: Grade II Tube type: Oral Tube size: 7.0 mm Number of attempts: 1 Airway Equipment and Method: Rigid stylet and Video-laryngoscopy Placement Confirmation: ETT inserted through vocal cords under direct vision,  breath sounds checked- equal and bilateral and positive ETCO2 Secured at: 21 cm Tube secured with: Tape Dental Injury: Teeth and Oropharynx as per pre-operative assessment

## 2014-09-08 ENCOUNTER — Encounter (HOSPITAL_COMMUNITY): Payer: Self-pay | Admitting: Neurosurgery

## 2014-09-08 LAB — GLUCOSE, CAPILLARY
GLUCOSE-CAPILLARY: 141 mg/dL — AB (ref 70–99)
GLUCOSE-CAPILLARY: 184 mg/dL — AB (ref 70–99)
GLUCOSE-CAPILLARY: 113 mg/dL — AB (ref 70–99)

## 2014-09-08 MED ORDER — VANCOMYCIN HCL IN DEXTROSE 1-5 GM/200ML-% IV SOLN
1000.0000 mg | Freq: Two times a day (BID) | INTRAVENOUS | Status: AC
  Administered 2014-09-08 (×2): 1000 mg via INTRAVENOUS
  Filled 2014-09-08 (×2): qty 200

## 2014-09-08 MED ORDER — INSULIN ASPART 100 UNIT/ML ~~LOC~~ SOLN
0.0000 [IU] | Freq: Three times a day (TID) | SUBCUTANEOUS | Status: DC
  Administered 2014-09-08: 2 [IU] via SUBCUTANEOUS
  Administered 2014-09-08 (×2): 3 [IU] via SUBCUTANEOUS

## 2014-09-08 MED ORDER — DICYCLOMINE HCL 10 MG PO CAPS
10.0000 mg | ORAL_CAPSULE | Freq: Three times a day (TID) | ORAL | Status: DC
  Administered 2014-09-08 – 2014-09-10 (×8): 10 mg via ORAL
  Filled 2014-09-08 (×12): qty 1

## 2014-09-08 MED ORDER — ENSURE COMPLETE PO LIQD
237.0000 mL | Freq: Two times a day (BID) | ORAL | Status: DC
  Administered 2014-09-08 – 2014-09-09 (×3): 237 mL via ORAL

## 2014-09-08 MED ORDER — INSULIN ASPART 100 UNIT/ML ~~LOC~~ SOLN: 0.0000 [IU] | Freq: Every day | SUBCUTANEOUS | Status: DC

## 2014-09-08 NOTE — Progress Notes (Signed)
Patient ID: Leah [name], female   DOB: 04/26/1946, 69 y.o.   MRN: [id number] Subjective:  The patient is alert and pleasant. She looks well. She is in no apparent distress. Her dysphagia has improved.  Objective: Vital signs in last 24 hours: Temp:  [97.9 F (36.6 C)-99.4 F (37.4 C)] 97.9 F (36.6 C) (02/09 0601) Pulse Rate:  [64-99] 76 (02/09 0601) Resp:  [11-20] 18 (02/09 0601) BP: (107-148)/(52-75) 111/54 mmHg (02/09 0601) SpO2:  [92 %-99 %] 93 % (02/09 0601) FiO2 (%):  [21 %] 21 % (02/08 1937) Weight:  [52 kg (114 lb 10.2 oz)] 52 kg (114 lb 10.2 oz) (02/08 1333)  Intake/Output from previous day: 02/08 0701 - 02/09 0700 In: 1220 [I.V.:1200] Out: 1 [Urine:1] Intake/Output this shift:    Physical exam the patient is alert and oriented 3. Her strength is normal. Her dressing is clean and dry. There is no hematoma or shift.  Lab Results: No results for input(s): WBC, HGB, HCT, PLT in the last 72 hours. BMET No results for input(s): NA, K, CL, CO2, GLUCOSE, BUN, CREATININE, CALCIUM in the last 72 hours.  Studies/Results: Dg Cervical Spine 2-3 Views  09/07/2014   CLINICAL DATA:  Exploration of fusion and removal of anterior cervical hematoma.  EXAM: CERVICAL SPINE - 2-3 VIEW  COMPARISON:  09/03/2014; radiographs from 09/07/2014  FINDINGS: A series of 3 portable lateral intraoperative radiographs are provided.  The first demonstrates tissue spreaders in place at the level of the C4-C5-C6 ACDF.  The second image demonstrates bony interbody graft but apparent removal of the plate and screw fixator. A C5 and a C6 screw are present.  The final image demonstrates a plate screw fixator in place, with prevertebral sponge material and expected alignment and appearance.  IMPRESSION: 1. The series of 3 portable spot images in this case appear to demonstrate hardware replacement in the context of the anterior cervical hematoma evacuation.   Electronically Signed   By: Sherryl Barters M.D.    On: 09/07/2014 20:41    Assessment/Plan: Postop day #1: I will continue the Hemovac until tomorrow. She will likely go home tomorrow. She is doing well. I'll give her another dose of vancomycin because she has indwelling drain.  LOS: 1 day     Rosena Bartle D 09/08/2014, 7:59 AM

## 2014-09-08 NOTE — Progress Notes (Signed)
CARE MANAGEMENT NOTE 09/08/2014  Patient:  Leah [name], Leah [name]   Account Number:  [id number]  Date Initiated:  09/08/2014  Documentation initiated by:  Olga Coaster  Subjective/Objective Assessment:   ADMITTED FOR SURGERY     Action/Plan:   CM FOLLOWING FOR DCP   Anticipated DC Date:  09/12/2014   Anticipated DC Plan:  AWAITING FOR PT/OT EVAL FOR DISPOSITION NEEDS     DC Planning Services  CM consult          Status of service:  In process, will continue to follow Medicare Important Message given?   (If response is "NO", the following Medicare IM given date fields will be blank)  Per UR Regulation:  Reviewed for med. necessity/level of care/duration of stay  Comments:  2/9/2016Mindi Slicker RN,BSN,MHA [phone]

## 2014-09-08 NOTE — Progress Notes (Signed)
Pt returned from surgery, and complained that she did not feel right.  I took her blood sugar and it was 153, per Dr. Arnoldo Morale orders,  I notified on-call Dr. Annette Stable and he ordered a moderate insulin order set.

## 2014-09-08 NOTE — Progress Notes (Signed)
INITIAL NUTRITION ASSESSMENT  DOCUMENTATION CODES Per approved criteria  -Not Applicable   INTERVENTION: Downgrade diet to Dysphagia 3 (Mechanical Soft) Provide Ensure Complete BID after meals Provided diet tips for easy to swallow foods in each food group  NUTRITION DIAGNOSIS: Inadequate oral intake related to swallowing difficulty as evidenced by 5% weight loss in one week.   Goal: Pt to meet >/= 90% of their estimated nutrition needs   Monitor:  PO intake, weight trend, labs  Reason for Assessment: Malnutrition Screening Tool, score of 2  69 y.o. female  Admitting Dx: <principal problem not specified>  ASSESSMENT: 69 year old white female who underweight C5-6 and C6-7 anterior cervical discectomy, fusion, and plating 4 days ago at the [facility]. The patient has developed dysphagia; noted to have a cervical hematoma.  S/p evacuation of cervical hematoma, revision of cervical instrumentation 2/8  Pt states that she usually weighs 120 lbs. Weight history shows pt has lost 8 lbs in the past couple weeks- 7% weight loss. She reports eating less since Friday due to surgery and swallowing difficulty. She reports that her appetite is good and she ate 75% of breakfast this morning. She reports history of swallowing difficulty and understands how to modify textures.  RD discussed appropriate textures of food and provided suggestions of easy to swallow, healthful foods from each food group. Pt agreeable to receiving Ensure Complete until PO intake improves.    Nutrition Focused Physical Exam:  Subcutaneous Fat:  Orbital Region: wnl Upper Arm Region: wnl Thoracic and Lumbar Region: NA  Muscle:  Temple Region: mild wasting Clavicle Bone Region: mild wasting Clavicle and Acromion Bone Region: mild wasting Scapular Bone Region: NA Dorsal Hand: wnl Patellar Region: wnl Anterior Thigh Region: wnl Posterior Calf Region: wnl  Edema: none  noted   Height: Ht Readings from Last 1 Encounters:  09/07/14 5\' 1"  (1.549 m)    Weight: Wt Readings from Last 1 Encounters:  09/07/14 114 lb 10.2 oz (52 kg)    Ideal Body Weight: 105 lbs  % Ideal Body Weight: 108%  Wt Readings from Last 10 Encounters:  09/07/14 114 lb 10.2 oz (52 kg)  08/20/14 122 lb 12.8 oz (55.702 kg)  08/04/14 122 lb 9.6 oz (55.611 kg)  07/20/14 121 lb 6.4 oz (55.067 kg)  07/13/14 120 lb (54.432 kg)  07/07/14 120 lb (54.432 kg)  06/08/14 120 lb 9.6 oz (54.704 kg)  06/01/14 120 lb (54.432 kg)  05/27/14 119 lb 6 oz (54.148 kg)  05/21/14 119 lb (53.978 kg)    Usual Body Weight: 120 lbs  % Usual Body Weight: 95%  BMI:  Body mass index is 21.67 kg/(m^2).  Estimated Nutritional Needs: Kcal: 1500-1700 Protein: 60-70 grams Fluid: 1.5-1.7 L/day  Skin: closed incision on anterior neck; closed incision on abdomen  Diet Order: Diet regular  EDUCATION NEEDS: -No education needs identified at this time   Intake/Output Summary (Last 24 hours) at 09/08/14 1305 Last data filed at 09/08/14 0500  Gross per 24 hour  Intake   1220 ml  Output      1 ml  Net   1219 ml    Last BM: 2/9  Labs:  No results for input(s): NA, K, CL, CO2, BUN, CREATININE, CALCIUM, MG, PHOS, GLUCOSE in the last 168 hours.  CBG (last 3)   Recent Labs  09/08/14 1158  GLUCAP 141*    Scheduled Meds: . atorvastatin  40 mg Oral q1800  . dicyclomine  10 mg Oral TID AC &  HS  . docusate sodium  100 mg Oral BID  . fluticasone  1 puff Inhalation BID  . insulin aspart  0-15 Units Subcutaneous TID WC  . insulin aspart  0-5 Units Subcutaneous QHS  . lisinopril  5 mg Oral Daily  . LORazepam  0.5 mg Oral QHS  . pantoprazole  80 mg Oral Q1200  . sertraline  50 mg Oral QHS  . vancomycin  1,000 mg Intravenous BID    Continuous Infusions: . lactated ringers 75 mL/hr at 09/08/14 6659    Past Medical History  Diagnosis Date  . Esophageal spasm   . GERD (gastroesophageal  reflux disease)   . Chronic gastritis   . Diverticulosis   . B12 deficiency   . IBS (irritable bowel syndrome)   . Anxiety   . Depression   . Hiatal hernia   . Iron deficiency anemia   . Hyperlipidemia   . Diverticulitis   . Barrett esophagus   . Rosacea   . Interstitial cystitis   . Hypertension   . Hyperlipidemia   . Chronic bronchitis     Past Surgical History  Procedure Laterality Date  . Tonsillectomy and adenoidectomy    . Tmj arthroplasty    . Vocal cord polypectomy    . Carpal tunnel release  1993    right  . Abdominal hysterectomy    . Cholecystectomy    . Partial colectomy    . Sinus surgery with instatrak    . Laparoscopic appendectomy N/A 09/21/2013    Procedure: APPENDECTOMY LAPAROSCOPIC;  Surgeon: Rolm Bookbinder, MD;  Location: [facility];  Service: General;  Laterality: N/A;  . Anterior fusion cervical spine  09/03/2014  . Anterior cervical decomp/discectomy fusion N/A 09/07/2014    Procedure: Exploration of Fusion and Removal of Anterior Cervical Hematoma;  Surgeon: Newman Pies, MD;  Location: [facility]UR[facility];  Service: Neurosurgery;  Laterality: N/A;    Pryor Ochoa RD, LDN Inpatient Clinical Dietitian Pager: [phone] After Hours Pager: [phone]

## 2014-09-09 LAB — GLUCOSE, CAPILLARY
GLUCOSE-CAPILLARY: 112 mg/dL — AB (ref 70–99)
GLUCOSE-CAPILLARY: 113 mg/dL — AB (ref 70–99)
GLUCOSE-CAPILLARY: 153 mg/dL — AB (ref 70–99)
GLUCOSE-CAPILLARY: 193 mg/dL — AB (ref 70–99)
Glucose-Capillary: 105 mg/dL — ABNORMAL HIGH (ref 70–99)
Glucose-Capillary: 83 mg/dL (ref 70–99)

## 2014-09-09 MED ORDER — BISACODYL 10 MG RE SUPP
10.0000 mg | Freq: Once | RECTAL | Status: AC
  Administered 2014-09-09: 10 mg via RECTAL
  Filled 2014-09-09: qty 1

## 2014-09-09 NOTE — Progress Notes (Signed)
Patient ID: Leah [name], female   DOB: 1946/04/23, 69 y.o.   MRN: [id number] Subjective:  The patient is alert and pleasant. She looks well. She still having some dysphagia.  Objective: Vital signs in last 24 hours: Temp:  [97.7 F (36.5 C)-99.5 F (37.5 C)] 97.8 F (36.6 C) (02/10 0553) Pulse Rate:  [65-98] 66 (02/10 0553) Resp:  [18] 18 (02/10 0553) BP: (110-148)/(52-62) 148/61 mmHg (02/10 0553) SpO2:  [94 %-99 %] 99 % (02/10 0553)  Intake/Output from previous day: 02/09 0701 - 02/10 0700 In: 15  Out: -  Intake/Output this shift:    Physical exam the patient is alert and oriented 3. Her strength is normal. Her dressing is clean and dry but falling off. There is no evidence of hematoma or shift. I removed her drain.  Lab Results: No results for input(s): WBC, HGB, HCT, PLT in the last 72 hours. BMET No results for input(s): NA, K, CL, CO2, GLUCOSE, BUN, CREATININE, CALCIUM in the last 72 hours.  Studies/Results: Dg Cervical Spine 2-3 Views  09/07/2014   CLINICAL DATA:  Exploration of fusion and removal of anterior cervical hematoma.  EXAM: CERVICAL SPINE - 2-3 VIEW  COMPARISON:  09/03/2014; radiographs from 09/07/2014  FINDINGS: A series of 3 portable lateral intraoperative radiographs are provided.  The first demonstrates tissue spreaders in place at the level of the C4-C5-C6 ACDF.  The second image demonstrates bony interbody graft but apparent removal of the plate and screw fixator. A C5 and a C6 screw are present.  The final image demonstrates a plate screw fixator in place, with prevertebral sponge material and expected alignment and appearance.  IMPRESSION: 1. The series of 3 portable spot images in this case appear to demonstrate hardware replacement in the context of the anterior cervical hematoma evacuation.   Electronically Signed   By: Sherryl Barters M.D.   On: 09/07/2014 20:41    Assessment/Plan: Postop day #2: The patient is doing well. She may go home later on  this afternoon.  LOS: 2 days     Colbert Curenton D 09/09/2014, 9:00 AM

## 2014-09-09 NOTE — Progress Notes (Signed)
Patient ID: Leah [name], female   DOB: [birth year], 69 y.o.   MRN: [id number] Subjective:  The patient is alert and pleasant. She is in no apparent distress. She looks well. She tells me that she wants to stay until tomorrow.  Objective: Vital signs in last 24 hours: Temp:  [97.7 F (36.5 C)-99.5 F (37.5 C)] 98.2 F (36.8 C) (02/10 1018) Pulse Rate:  [65-83] 67 (02/10 1018) Resp:  [18] 18 (02/10 1018) BP: (117-148)/(52-79) 137/79 mmHg (02/10 1018) SpO2:  [95 %-99 %] 98 % (02/10 1128)  Intake/Output from previous day: 02/09 0701 - 02/10 0700 In: 15  Out: -  Intake/Output this shift:    Physical exam the patient is alert and pleasant. Her strength is normal. Her dressing is clean and dry. There is no hematoma or shift.  Lab Results: No results for input(s): WBC, HGB, HCT, PLT in the last 72 hours. BMET No results for input(s): NA, K, CL, CO2, GLUCOSE, BUN, CREATININE, CALCIUM in the last 72 hours.  Studies/Results: Dg Cervical Spine 2-3 Views  09/07/2014   CLINICAL DATA:  Exploration of fusion and removal of anterior cervical hematoma.  EXAM: CERVICAL SPINE - 2-3 VIEW  COMPARISON:  09/03/2014; radiographs from 09/07/2014  FINDINGS: A series of 3 portable lateral intraoperative radiographs are provided.  The first demonstrates tissue spreaders in place at the level of the C4-C5-C6 ACDF.  The second image demonstrates bony interbody graft but apparent removal of the plate and screw fixator. A C5 and a C6 screw are present.  The final image demonstrates a plate screw fixator in place, with prevertebral sponge material and expected alignment and appearance.  IMPRESSION: 1. The series of 3 portable spot images in this case appear to demonstrate hardware replacement in the context of the anterior cervical hematoma evacuation.   Electronically Signed   By: Sherryl Barters M.D.   On: 09/07/2014 20:41    Assessment/Plan: The patient is doing well. I'll plan to send her home tomorrow as  requested.  LOS: 2 days     Mariano Doshi D 09/09/2014, 12:07 PM

## 2014-09-10 LAB — GLUCOSE, CAPILLARY
GLUCOSE-CAPILLARY: 100 mg/dL — AB (ref 70–99)
GLUCOSE-CAPILLARY: 100 mg/dL — AB (ref 70–99)

## 2014-09-10 MED ORDER — BISACODYL 10 MG RE SUPP
10.0000 mg | Freq: Once | RECTAL | Status: AC
  Administered 2014-09-10: 10 mg via RECTAL
  Filled 2014-09-10: qty 1

## 2014-09-10 MED ORDER — HYDROMORPHONE HCL 2 MG PO TABS
4.0000 mg | ORAL_TABLET | ORAL | Status: DC | PRN
  Filled 2014-09-10: qty 2

## 2014-09-10 MED ORDER — HYDROMORPHONE HCL 4 MG PO TABS: 4.0000 mg | ORAL_TABLET | ORAL | Status: DC | PRN

## 2014-09-10 NOTE — Discharge Summary (Signed)
Physician Discharge Summary  Patient ID: Leah [name] MRN: [id number] DOB/AGE: 1946-07-07 69 y.o.  Admit date: 09/07/2014 Discharge date: 09/10/2014  Admission Diagnoses: Cervical hematoma, dysphagia  Discharge Diagnoses: The same Active Problems:   Hematoma   Discharged Condition: good  Hospital Course: I admitted the patient on 09/07/2014 with a diagnosis of a postoperative cervical hematoma and dysphagia. I recommended an evacuation of the hematoma. The patient decided to proceed with surgery.  I performed an evacuation of the hematoma on 09/07/2014. The surgery went well. The patient had some postoperative dysphagia which has been gradually resolving. She has been taking adequate by mouth's.  The patient's postoperative course was unremarkable. On 09/10/2014 the patient requested discharge to home. The patient was given oral and written discharge instructions. All her questions were answered. The patient requested another pain medication as she said the Percocet gives her a headache. She was discharged with Dilaudid.  Consults: None Significant Diagnostic Studies: None Treatments: Evacuation of cervical hematoma Discharge Exam: Blood pressure 143/68, pulse 70, temperature 98.7 F (37.1 C), temperature source Oral, resp. rate 18, height 5\' 1"  (1.549 m), weight 52 kg (114 lb 10.2 oz), SpO2 94 %. The patient is alert and pleasant. She looks well. Her wound is healing well. There is no hematoma or shift. Her strength is normal.  Disposition: Home  Discharge Instructions    Call MD for:  difficulty breathing, headache or visual disturbances    Complete by:  As directed      Call MD for:  extreme fatigue    Complete by:  As directed      Call MD for:  hives    Complete by:  As directed      Call MD for:  persistant dizziness or light-headedness    Complete by:  As directed      Call MD for:  persistant nausea and vomiting    Complete by:  As directed      Call MD for:   redness, tenderness, or signs of infection (pain, swelling, redness, odor or green/yellow discharge around incision site)    Complete by:  As directed      Call MD for:  severe uncontrolled pain    Complete by:  As directed      Call MD for:  temperature >100.4    Complete by:  As directed      Diet - low sodium heart healthy    Complete by:  As directed      Discharge instructions    Complete by:  As directed   Call [phone] for a followup appointment. Take a stool softener while you are using pain medications.     Driving Restrictions    Complete by:  As directed   Do not drive for 2 weeks.     Increase activity slowly    Complete by:  As directed      Lifting restrictions    Complete by:  As directed   Do not lift more than 5 pounds. No excessive bending or twisting.     May shower / Bathe    Complete by:  As directed   He may shower after the pain she is removed 3 days after surgery. Leave the incision alone.     No dressing needed    Complete by:  As directed             Medication List    STOP taking these medications  cyclobenzaprine 10 MG tablet  Commonly known as:  FLEXERIL     HYDROcodone-acetaminophen 5-325 MG per tablet  Commonly known as:  NORCO/VICODIN     ibuprofen 200 MG tablet  Commonly known as:  ADVIL,MOTRIN     ibuprofen 800 MG tablet  Commonly known as:  ADVIL,MOTRIN     LORazepam 0.5 MG tablet  Commonly known as:  ATIVAN     TYLENOL 500 MG tablet  Generic drug:  acetaminophen      TAKE these medications        albuterol 108 (90 BASE) MCG/ACT inhaler  Commonly known as:  PROAIR HFA  Inhale 2 puffs into the lungs every 6 (six) hours as needed for wheezing.     atorvastatin 40 MG tablet  Commonly known as:  LIPITOR  Take 1 tablet (40 mg total) by mouth daily at 6 PM.     beclomethasone 80 MCG/ACT inhaler  Commonly known as:  QVAR  Inhale 2 puffs into the lungs 2 (two) times daily.     dicyclomine 10 MG capsule  Commonly  known as:  BENTYL  Take 1 capsule by mouth three times daily as needed for cramping and spasms.     esomeprazole 40 MG capsule  Commonly known as:  NEXIUM  Take 1 capsule (40 mg total) by mouth 2 (two) times daily before a meal.     estradiol 0.1 MG/24HR patch  Commonly known as:  VIVELLE-DOT  Place 1 patch onto the skin 2 (two) times a week.     HYDROmorphone 4 MG tablet  Commonly known as:  DILAUDID  Take 1 tablet (4 mg total) by mouth every 4 (four) hours as needed for severe pain.     lisinopril 5 MG tablet  Commonly known as:  PRINIVIL,ZESTRIL  Take 5 mg by mouth daily.      ASK your doctor about these medications        sertraline 50 MG tablet  Commonly known as:  ZOLOFT  Take 50 mg by mouth at bedtime.         SignedOphelia Charter 09/10/2014, 7:42 AM

## 2014-09-10 NOTE — Progress Notes (Signed)
D/C orders received. Pt and spouse educated on d/c instructions and pt verbalized understanding. Pt handed d/c packet and prescription. IV removed. Pt taken downstairs by volunteer services.

## 2014-09-23 ENCOUNTER — Telehealth: Payer: Self-pay | Admitting: Internal Medicine

## 2014-09-23 NOTE — Telephone Encounter (Signed)
Pt states she is having abdominal pain with lots of bloating. Pt would like to be seen sooner than 1st available. Pt scheduled to see Tye Savoy NP 09/29/14@1 :30pm. Pt aware of appt.

## 2014-09-28 ENCOUNTER — Encounter: Payer: Self-pay | Admitting: *Deleted

## 2014-09-29 ENCOUNTER — Encounter: Payer: Self-pay | Admitting: Nurse Practitioner

## 2014-09-29 ENCOUNTER — Ambulatory Visit (INDEPENDENT_AMBULATORY_CARE_PROVIDER_SITE_OTHER): Payer: BLUE CROSS/BLUE SHIELD | Admitting: Nurse Practitioner

## 2014-09-29 VITALS — BP 124/76 | HR 96 | Ht 61.5 in | Wt 117.6 lb

## 2014-09-29 DIAGNOSIS — R1310 Dysphagia, unspecified: Secondary | ICD-10-CM

## 2014-09-29 DIAGNOSIS — R1314 Dysphagia, pharyngoesophageal phase: Secondary | ICD-10-CM | POA: Diagnosis not present

## 2014-09-29 MED ORDER — ONDANSETRON 4 MG PO TBDP: ORAL_TABLET | ORAL | Status: DC

## 2014-09-29 NOTE — Progress Notes (Signed)
     History of Present Illness:   Patient is a 69 year old female known to Dr. Olevia Perches. She has a history of chronic abdominal pain, diverticulitis, adenomatous colon polyps, GERD/Barrett's esophagus / esophageal dysmotility and esophageal strictures requiring dilation (last one Feb 2014).  Patient is up-to-date on her colon cancer screening, last one in October 2014.  Patient is here with complaints of recurrent dysphagia. She had neck surgery the beginning of February. She required a second neck operation a few days later for what sounds like a hematoma. Swallowing problems proceeded her neck surgery. She is having regurgitation of food and liquids. Solid foods sticking in the esophagus. She has upper abdominal bloating and "indigestion"   Current Medications, Allergies, Past Medical History, Past Surgical History, Family History and Social History were reviewed in [employer] record.  Physical Exam: General: Pleasant, well developed , white female in no acute distress Head: Normocephalic and atraumatic. In a neck brace. Eyes:  sclerae anicteric, conjunctiva pink  Ears: Normal auditory acuity Lungs: Clear throughout to auscultation Heart: Regular rate and rhythm Abdomen: Soft, non distended, non-tender. No masses, no hepatomegaly. Normal bowel sounds Musculoskeletal: Symmetrical with no gross deformities  Extremities: No edema  Neurological: Alert oriented x 4, grossly nonfocal Psychological:  Alert and cooperative. Normal mood and affect  Assessment and Recommendations:  70. 69 year old female with recurrent dysphagia. She has a history of esophageal dysmotility but also esophageal strictures. Her last EGD was February 2014 with dilation of a mild esophageal stricture. Patient had recent neck surgery and does have hardware in her neck but dysphagia present prior to recent neck surgery. We will repeat her barium swallow with tablet to look for strictures. If  stricture is present then will plan for an EGD with dilation in approximately 3 weeks (patient really needs to be 4-6 weeks out from her neck surgery). I will call her with barium swallow results  We discussed the importance of eating slowly, taking small bites of food, chewing well and consuming adequate amounts of fluid in between bites to avoid food impaction.  She requests something for regurgitation / nausea. We will try Zofran ODT.

## 2014-09-29 NOTE — Patient Instructions (Addendum)
You have been scheduled for a Barium Esophogram at [facility] (1st floor of the hospital) on 10-06-2014 at 10:30 am  Please arrive at 10:15 am to your appointment for registration. Make certain not to have anything to eat or drink 3 hours prior to your test. If you need to reschedule for any reason, please contact radiology at [phone] to do so.  Eat small bites and chew your food well.  We will call you with the results.  We sent a prescription for ondansetron ( zofran) tablet dissolves on the tongue.  [facility] [address], [place], [place].  __________________________________________________________________ A barium swallow is an examination that concentrates on views of the esophagus. This tends to be a double contrast exam (barium and two liquids which, when combined, create a gas to distend the wall of the oesophagus) or single contrast (non-ionic iodine based). The study is usually tailored to your symptoms so a good history is essential. Attention is paid during the study to the form, structure and configuration of the esophagus, looking for functional disorders (such as aspiration, dysphagia, achalasia, motility and reflux) EXAMINATION You may be asked to change into a gown, depending on the type of swallow being performed. A radiologist and radiographer will perform the procedure. The radiologist will advise you of the type of contrast selected for your procedure and direct you during the exam. You will be asked to stand, sit or lie in several different positions and to hold a small amount of fluid in your mouth before being asked to swallow while the imaging is performed .In some instances you may be asked to swallow barium coated marshmallows to assess the motility of a solid food bolus. The exam can be recorded as a digital or video fluoroscopy procedure. POST PROCEDURE It will take 1-2 days for the barium to pass through your system. To facilitate this, it is important, unless otherwise  directed, to increase your fluids for the next 24-48hrs and to resume your normal diet.  This test typically takes about 30 minutes to perform. __________________________________________________________________________________

## 2014-09-30 ENCOUNTER — Telehealth: Payer: Self-pay | Admitting: Nurse Practitioner

## 2014-09-30 MED ORDER — ONDANSETRON 4 MG PO TBDP: ORAL_TABLET | ORAL | Status: DC

## 2014-09-30 NOTE — Telephone Encounter (Signed)
LM for the patient to advise we resent the Generic Zofran to [facility], [place], [place].

## 2014-09-30 NOTE — Progress Notes (Signed)
Reviewed and discussed with Ms Leah Holstein NP, likely post op dismotility, also may have a stricture. Would like to see the esophagram first.

## 2014-10-06 ENCOUNTER — Ambulatory Visit (HOSPITAL_COMMUNITY)
Admission: RE | Admit: 2014-10-06 | Discharge: 2014-10-06 | Disposition: A | Discharge status: Home / Self Care | Payer: BLUE CROSS/BLUE SHIELD | Source: Ambulatory Visit | Attending: Nurse Practitioner | Admitting: Nurse Practitioner

## 2014-10-06 DIAGNOSIS — K449 Diaphragmatic hernia without obstruction or gangrene: Secondary | ICD-10-CM | POA: Diagnosis not present

## 2014-10-06 DIAGNOSIS — R1314 Dysphagia, pharyngoesophageal phase: Secondary | ICD-10-CM

## 2014-11-09 ENCOUNTER — Ambulatory Visit (INDEPENDENT_AMBULATORY_CARE_PROVIDER_SITE_OTHER): Payer: BLUE CROSS/BLUE SHIELD | Admitting: Family Medicine

## 2014-11-09 ENCOUNTER — Encounter: Payer: Self-pay | Admitting: Family Medicine

## 2014-11-09 VITALS — BP 126/82 | HR 73 | Temp 98.3°F | Wt 117.6 lb

## 2014-11-09 DIAGNOSIS — E2839 Other primary ovarian failure: Secondary | ICD-10-CM | POA: Diagnosis not present

## 2014-11-09 DIAGNOSIS — K219 Gastro-esophageal reflux disease without esophagitis: Secondary | ICD-10-CM

## 2014-11-09 DIAGNOSIS — E785 Hyperlipidemia, unspecified: Secondary | ICD-10-CM

## 2014-11-09 DIAGNOSIS — I10 Essential (primary) hypertension: Secondary | ICD-10-CM | POA: Diagnosis not present

## 2014-11-09 LAB — CBC WITH DIFFERENTIAL/PLATELET
BASOS PCT: 1 % (ref 0.0–3.0)
Basophils Absolute: 0 10*3/uL (ref 0.0–0.1)
Eosinophils Absolute: 0.2 10*3/uL (ref 0.0–0.7)
Eosinophils Relative: 4 % (ref 0.0–5.0)
HCT: 36.9 % (ref 36.0–46.0)
Hemoglobin: 12.4 g/dL (ref 12.0–15.0)
LYMPHS PCT: 29.4 % (ref 12.0–46.0)
Lymphs Abs: 1.5 10*3/uL (ref 0.7–4.0)
MCHC: 33.7 g/dL (ref 30.0–36.0)
MCV: 85.5 fl (ref 78.0–100.0)
MONOS PCT: 6.9 % (ref 3.0–12.0)
Monocytes Absolute: 0.3 10*3/uL (ref 0.1–1.0)
NEUTROS PCT: 58.7 % (ref 43.0–77.0)
Neutro Abs: 2.9 10*3/uL (ref 1.4–7.7)
PLATELETS: 232 10*3/uL (ref 150.0–400.0)
RBC: 4.32 Mil/uL (ref 3.87–5.11)
RDW: 15.1 % (ref 11.5–15.5)
WBC: 5 10*3/uL (ref 4.0–10.5)

## 2014-11-09 LAB — BASIC METABOLIC PANEL
BUN: 11 mg/dL (ref 6–23)
CO2: 30 mEq/L (ref 19–32)
Calcium: 9.5 mg/dL (ref 8.4–10.5)
Chloride: 104 mEq/L (ref 96–112)
Creatinine, Ser: 0.81 mg/dL (ref 0.40–1.20)
GFR: 75.09 mL/min (ref >60.00)
Glucose, Bld: 105 mg/dL — ABNORMAL HIGH (ref 70–99)
POTASSIUM: 3.9 meq/L (ref 3.5–5.1)
Sodium: 138 mEq/L (ref 135–145)

## 2014-11-09 LAB — LIPID PANEL
Cholesterol: 211 mg/dL — ABNORMAL HIGH (ref 0–200)
HDL: 31.4 mg/dL — AB (ref >39.00)
LDL Cholesterol: 143 mg/dL — ABNORMAL HIGH (ref 0–99)
NonHDL: 179.6
TRIGLYCERIDES: 182 mg/dL — AB (ref 0.0–149.0)
Total CHOL/HDL Ratio: 7
VLDL: 36.4 mg/dL (ref 0.0–40.0)

## 2014-11-09 LAB — HEPATIC FUNCTION PANEL
ALK PHOS: 75 U/L (ref 39–117)
ALT: 10 U/L (ref 0–35)
AST: 14 U/L (ref 0–37)
Albumin: 4 g/dL (ref 3.5–5.2)
BILIRUBIN TOTAL: 0.4 mg/dL (ref 0.2–1.2)
Bilirubin, Direct: 0 mg/dL (ref 0.0–0.3)
Total Protein: 7 g/dL (ref 6.0–8.3)

## 2014-11-09 LAB — MICROALBUMIN / CREATININE URINE RATIO
Creatinine,U: 205 mg/dL
MICROALB/CREAT RATIO: 0.3 mg/g (ref 0.0–30.0)
Microalb, Ur: <0.7 mg/dL (ref 0.0–1.9)

## 2014-11-09 LAB — POCT URINALYSIS DIPSTICK
Bilirubin, UA: NEGATIVE
GLUCOSE UA: NEGATIVE
Ketones, UA: NEGATIVE
Leukocytes, UA: NEGATIVE
NITRITE UA: NEGATIVE
PH UA: 6
Protein, UA: NEGATIVE
RBC UA: NEGATIVE
Urobilinogen, UA: 4

## 2014-11-09 MED ORDER — LISINOPRIL 5 MG PO TABS: 5.0000 mg | ORAL_TABLET | Freq: Every day | ORAL | Status: DC

## 2014-11-09 MED ORDER — ESOMEPRAZOLE MAGNESIUM 40 MG PO CPDR: 40.0000 mg | DELAYED_RELEASE_CAPSULE | Freq: Two times a day (BID) | ORAL | Status: DC

## 2014-11-09 MED ORDER — ATORVASTATIN CALCIUM 40 MG PO TABS: 40.0000 mg | ORAL_TABLET | Freq: Every day | ORAL | Status: DC

## 2014-11-09 NOTE — Progress Notes (Signed)
Pre visit review using our clinic review tool, if applicable. No additional management support is needed unless otherwise documented below in the visit note. 

## 2014-11-09 NOTE — Progress Notes (Signed)
Patient ID: Leah [name], female    DOB: 1946/07/31  Age: 69 y.o. MRN: [id number]    Subjective:  Subjective HPI Leah [name] presents for f/u bp and cholesterol   She also needs her bmd done.    Review of Systems  Constitutional: Negative for activity change, appetite change, fatigue and unexpected weight change.  Respiratory: Negative for cough and shortness of breath.   Cardiovascular: Negative for chest pain and palpitations.  Psychiatric/Behavioral: Negative for behavioral problems and dysphoric mood. The patient is not nervous/anxious.     History Past Medical History  Diagnosis Date  . Esophageal spasm   . GERD (gastroesophageal reflux disease)   . Chronic gastritis   . Diverticulosis   . B12 deficiency   . IBS (irritable bowel syndrome)   . Anxiety   . Depression   . Hiatal hernia   . Iron deficiency anemia   . Hyperlipidemia   . Diverticulitis   . Barrett esophagus   . Rosacea   . Interstitial cystitis   . Hypertension   . Hyperlipidemia   . Chronic bronchitis   . Colon polyp 04/30/2013    Tubular adenoma    She has past surgical history that includes Tonsillectomy and adenoidectomy; TMJ Arthroplasty; vocal cord polypectomy; Carpal tunnel release (1993); Abdominal hysterectomy; Cholecystectomy; Partial colectomy; Sinus surgery with [employer]; laparoscopic appendectomy (N/A, 09/21/2013); Anterior fusion cervical spine (09/03/2014); and Anterior cervical decomp/discectomy fusion (N/A, 09/07/2014).   Her family history includes Colon polyps in her mother; Diabetes in her father; Heart disease in her father; Hypertension in her father; Leukemia in her father; Pancreatic cancer in her father and mother; Prostate cancer in her father. There is no history of Colon cancer.She reports that she has been smoking Cigarettes.  She has a 22.5 pack-year smoking history. She has never used smokeless tobacco. She reports that she does not drink alcohol or use illicit  drugs.  Current Outpatient Prescriptions on File Prior to Visit  Medication Sig Dispense Refill  . beclomethasone (QVAR) 80 MCG/ACT inhaler Inhale 2 puffs into the lungs 2 (two) times daily. (Patient taking differently: Inhale 2 puffs into the lungs 2 (two) times daily as needed. ) 1 Inhaler 12  . dicyclomine (BENTYL) 10 MG capsule Take 1 capsule by mouth three times daily as needed for cramping and spasms. (Patient taking differently: Take 10 mg by mouth 3 (three) times daily as needed for spasms. Take 1 capsule by mouth three times daily as needed for cramping and spasms.) 30 capsule 0  . estradiol (VIVELLE-DOT) 0.1 MG/24HR Place 1 patch onto the skin 2 (two) times a week.      Marland Kitchen LORazepam (ATIVAN) 1 MG tablet Take 1 mg by mouth every 6 (six) hours as needed for anxiety.    . ondansetron (ZOFRAN ODT) 4 MG disintegrating tablet Place 1 tab on the tongue every 6 hours as needed for nausea. 40 tablet 0  . sertraline (ZOLOFT) 50 MG tablet Take 50 mg by mouth at bedtime.     No current facility-administered medications on file prior to visit.     Objective:  Objective Physical Exam  Constitutional: She is oriented to person, place, and time. She appears well-developed and well-nourished. No distress.  HENT:  Head: Normocephalic and atraumatic.  Eyes: Conjunctivae and EOM are normal. Pupils are equal, round, and reactive to light.  Neck: Normal range of motion. Neck supple. No thyromegaly present.  Cardiovascular: Normal rate, regular rhythm, normal heart sounds and intact distal pulses.  No murmur heard. Pulmonary/Chest: Effort normal and breath sounds normal. No respiratory distress.  Abdominal: Soft. She exhibits no distension. There is no tenderness.  Musculoskeletal: She exhibits no edema.  Lymphadenopathy:    She has no cervical adenopathy.  Neurological: She is alert and oriented to person, place, and time.  Skin: Skin is warm and dry.  Psychiatric: She has a normal mood and  affect. Her behavior is normal.   BP 126/82 mmHg  Pulse 73  Temp(Src) 98.3 F (36.8 C) (Oral)  Wt 117 lb 9.6 oz (53.343 kg)  SpO2 97% Wt Readings from Last 3 Encounters:  11/09/14 117 lb 9.6 oz (53.343 kg)  09/29/14 117 lb 9.6 oz (53.343 kg)  09/07/14 114 lb 10.2 oz (52 kg)     Lab Results  Component Value Date   WBC 6.1 08/04/2014   HGB 13.4 08/04/2014   HCT 40.8 08/04/2014   PLT 226.0 08/04/2014   GLUCOSE 91 08/04/2014   CHOL 273* 08/04/2014   TRIG 185.0* 08/04/2014   HDL 36.00* 08/04/2014   LDLDIRECT 172.0 12/20/2012   LDLCALC 200* 08/04/2014   ALT 9 08/04/2014   AST 13 08/04/2014   NA 139 08/04/2014   K 4.1 08/04/2014   CL 105 08/04/2014   CREATININE 0.8 08/04/2014   BUN 13 08/04/2014   CO2 27 08/04/2014   TSH 1.28 09/19/2011   INR 1.0 05/21/2009   HGBA1C 6.1 09/19/2011   MICROALBUR 1.0 08/04/2014    Dg Esophagus  10/06/2014   CLINICAL DATA:  Difficulty swallowing solid food and pills for years. Several dilatations.  EXAM: ESOPHOGRAM / BARIUM SWALLOW / BARIUM TABLET STUDY  TECHNIQUE: Combined double contrast and single contrast examination performed using effervescent crystals, thick barium liquid, and thin barium liquid. The patient was observed with fluoroscopy swallowing a 40mm barium sulphate tablet.  FLUOROSCOPY TIME:  2 minutes and 24 seconds  COMPARISON:  05/26/2010  FINDINGS: Hypo pharyngeal portion exam demonstrates C4-6 anterior fixation. Incomplete distension in this area (centered at the C5-6 level), including on image 5 of series 1.  Double contrast evaluation of the esophagus demonstrates no mucosal abnormality.  Evaluation of primary peristalsis demonstrates a normal primary peristaltic wave on each swallow.  Single-contrast evaluation esophagus demonstrates no distal narrowing to suggest stricture. A small hiatal hernia.  A 13 mm barium tablet passes promptly.  IMPRESSION: 1. Status post anterior fixation thin the cervical spine. Hypopharyngeal evaluation  demonstrating mild narrowing/incomplete distention at this level. Favored to be related to postsurgical changes. If indicated, this area could be directly visualized endoscopically. 2. Small hiatal hernia. 3. Otherwise, no explanation for patient's symptoms.   Electronically Signed   By: Abigail Miyamoto M.D.   On: 10/06/2014 17:09     Assessment & Plan:  Plan I have changed Leah [name]'s lisinopril. I am also having her maintain her estradiol, sertraline, beclomethasone, dicyclomine, LORazepam, ondansetron, atorvastatin, and esomeprazole.  Meds ordered this encounter  Medications  . atorvastatin (LIPITOR) 40 MG tablet    Sig: Take 1 tablet (40 mg total) by mouth daily at 6 PM.    Dispense:  30 tablet    Refill:  2  . lisinopril (PRINIVIL,ZESTRIL) 5 MG tablet    Sig: Take 1 tablet (5 mg total) by mouth daily.    Refill:  0  . esomeprazole (NEXIUM) 40 MG capsule    Sig: Take 1 capsule (40 mg total) by mouth 2 (two) times daily before a meal.    Dispense:  90 capsule  Refill:  3    Problem List Items Addressed This Visit    GERD   Relevant Medications   esomeprazole (NEXIUM) capsule    Other Visit Diagnoses    Estrogen deficiency    -  Primary    Relevant Orders    DG Bone Density    Hyperlipidemia        Relevant Medications    atorvastatin (LIPITOR) tablet    lisinopril (PRINIVIL,ZESTRIL) tablet    Other Relevant Orders    Basic metabolic panel    CBC with Differential/Platelet    Hepatic function panel    Lipid panel    Microalbumin / creatinine urine ratio    POCT urinalysis dipstick    Essential hypertension        Relevant Medications    atorvastatin (LIPITOR) tablet    lisinopril (PRINIVIL,ZESTRIL) tablet    Other Relevant Orders    Basic metabolic panel    CBC with Differential/Platelet    Hepatic function panel    Lipid panel    Microalbumin / creatinine urine ratio    POCT urinalysis dipstick       Follow-up: Return in about 6 months (around 05/11/2015),  or if symptoms worsen or fail to improve, for f/u and labs.  Garnet Koyanagi, DO

## 2014-11-09 NOTE — Patient Instructions (Signed)

## 2014-11-10 ENCOUNTER — Other Ambulatory Visit: Payer: BLUE CROSS/BLUE SHIELD

## 2014-11-12 ENCOUNTER — Other Ambulatory Visit: Payer: Self-pay

## 2014-11-12 MED ORDER — EZETIMIBE 10 MG PO TABS: 10.0000 mg | ORAL_TABLET | Freq: Every day | ORAL | Status: DC

## 2014-11-16 ENCOUNTER — Telehealth: Payer: Self-pay | Admitting: Family Medicine

## 2014-11-16 NOTE — Telephone Encounter (Signed)
[name] name: Doralene Relation to pt: self Call back number: [phone] [facility]  Reason for call: Pt called state that Dr. Etter Sjogren gave her a new rx for cholestorol (pt not sure if ii is ezetimibe (ZETIA) 10 MG tablet) last week but states could not pick up since med cost $64.00 and can not afford it. Wants to have another rx that can be cheaper for her cholestorol. Please advise.

## 2014-11-16 NOTE — Telephone Encounter (Signed)
adivsed the patient that we has coupons ans she requested that one be mailed to her. Zetia coupon placed int he mail.    KP

## 2014-11-20 ENCOUNTER — Telehealth: Payer: Self-pay | Admitting: Family Medicine

## 2014-11-20 DIAGNOSIS — K219 Gastro-esophageal reflux disease without esophagitis: Secondary | ICD-10-CM

## 2014-11-20 MED ORDER — ESOMEPRAZOLE MAGNESIUM 40 MG PO CPDR: 40.0000 mg | DELAYED_RELEASE_CAPSULE | Freq: Two times a day (BID) | ORAL | Status: DC

## 2014-11-20 NOTE — Telephone Encounter (Signed)
Rx was faxed on 11/09/14, I will refax.     KP

## 2014-11-20 NOTE — Telephone Encounter (Signed)
[name] name: Geneive Relation to PR:FFMB Call back number: [phone] [facility] [address] [facility] [place]  Reason for call:   Patient states that her insurance will now only cover for a 90 day supply of esomeprazole

## 2014-12-02 ENCOUNTER — Encounter: Payer: Self-pay | Admitting: Podiatry

## 2014-12-02 ENCOUNTER — Ambulatory Visit (INDEPENDENT_AMBULATORY_CARE_PROVIDER_SITE_OTHER): Payer: BLUE CROSS/BLUE SHIELD

## 2014-12-02 ENCOUNTER — Ambulatory Visit (INDEPENDENT_AMBULATORY_CARE_PROVIDER_SITE_OTHER): Payer: BLUE CROSS/BLUE SHIELD | Admitting: Podiatry

## 2014-12-02 VITALS — BP 164/95 | HR 71 | Resp 15

## 2014-12-02 DIAGNOSIS — M79675 Pain in left toe(s): Secondary | ICD-10-CM

## 2014-12-02 DIAGNOSIS — L84 Corns and callosities: Secondary | ICD-10-CM

## 2014-12-02 DIAGNOSIS — M779 Enthesopathy, unspecified: Secondary | ICD-10-CM

## 2014-12-02 MED ORDER — TRIAMCINOLONE ACETONIDE 10 MG/ML IJ SUSP
10.0000 mg | Freq: Once | INTRAMUSCULAR | Status: AC
  Administered 2014-12-02: 10 mg

## 2014-12-02 NOTE — Progress Notes (Signed)
Subjective:     Patient ID: Leah [name], female   DOB: July 03, 1946, 69 y.o.   MRN: [id number]  HPI patient states I'm having a lot of pain in the bottom my third toe left. States that she did have a previous surgery on this but does not know exactly what was done and states that it's been inflamed and sore when pressed   Review of Systems  All other systems reviewed and are negative.      Objective:   Physical Exam  Constitutional: She is oriented to person, place, and time.  Cardiovascular: Intact distal pulses.   Musculoskeletal: Normal range of motion.  Neurological: She is oriented to person, place, and time.  Skin: Skin is warm.  Nursing note and vitals reviewed.  neurovascular status intact muscle strength adequate with range of motion subtalar midtarsal joint within normal limits. Patient's noted to have an inflamed plantar third toe left proximal to the interphalangeal joint with fluid buildup in this area but no indications of bone prominence     Assessment:     Possibility for inflamed capsule of the interphalangeal joint third toe with keratotic lesion formation    Plan:     H&P and x-rays reviewed. Did a proximal nerve block of the entire area and injected with approximately a third of the cc of dexamethasone Kenalog and then did full debridement of the lesion and reappoint when symptomatic and ultimately may require surgery

## 2014-12-02 NOTE — Progress Notes (Signed)
   Subjective:    Patient ID: Leah [name], female    DOB: 03-10-1946, 69 y.o.   MRN: [id number]  HPI Pt presents with pain in left 3rd toe, boney prominence causing callus and pain, states she had surgery previously   Review of Systems  Musculoskeletal: Positive for gait problem.  All other systems reviewed and are negative.      Objective:   Physical Exam        Assessment & Plan:

## 2014-12-08 ENCOUNTER — Encounter: Payer: Self-pay | Admitting: Family Medicine

## 2014-12-23 ENCOUNTER — Encounter: Payer: Self-pay | Admitting: Family Medicine

## 2014-12-30 ENCOUNTER — Other Ambulatory Visit: Payer: Self-pay | Admitting: Family Medicine

## 2014-12-30 NOTE — Telephone Encounter (Signed)
Please advise if this refill is appropriate. Leah [name] has a note advising on Losartan.     KP

## 2014-12-31 ENCOUNTER — Encounter: Payer: Self-pay | Admitting: Podiatry

## 2014-12-31 ENCOUNTER — Ambulatory Visit (INDEPENDENT_AMBULATORY_CARE_PROVIDER_SITE_OTHER): Payer: BLUE CROSS/BLUE SHIELD | Admitting: Podiatry

## 2014-12-31 VITALS — BP 118/66 | HR 85 | Resp 18

## 2014-12-31 DIAGNOSIS — L84 Corns and callosities: Secondary | ICD-10-CM

## 2014-12-31 DIAGNOSIS — M779 Enthesopathy, unspecified: Secondary | ICD-10-CM | POA: Diagnosis not present

## 2015-01-01 NOTE — Progress Notes (Signed)
Subjective:     Patient ID: Leah [name], female   DOB: [birth year], 69 y.o.   MRN: [id number]  HPI patient states that my left third toe is feeling better and that the lesion underneath while present is not as thick   Review of Systems     Objective:   Physical Exam Neurovascular status intact muscle strength adequate with inflammation and irritation of the left third toe plantar that has improved and keratotic lesion underneath the left metatarsal which is thick with pain when pressed    Assessment:     Keratotic lesion formation with pain and improving hammertoe third left    Plan:     Reviewed hammertoe and consideration for correction and today debrided lesion plantar left with no iatrogenic bleeding noted

## 2015-01-15 ENCOUNTER — Other Ambulatory Visit: Payer: Self-pay

## 2015-01-15 MED ORDER — EZETIMIBE 10 MG PO TABS: 10.0000 mg | ORAL_TABLET | Freq: Every day | ORAL | Status: DC

## 2015-01-20 ENCOUNTER — Telehealth: Payer: Self-pay

## 2015-01-20 NOTE — Telephone Encounter (Signed)
Bone Density results from 11/26/14 shows Osteopenia. Dr Etter Sjogren is recommending that the patient continues calcium and vitamin D and add Fosamax 70 mg weekly #4 with 11 refills and recheck in 2 years. Message left to call the office       KP

## 2015-01-21 ENCOUNTER — Telehealth: Payer: Self-pay | Admitting: Family Medicine

## 2015-01-21 DIAGNOSIS — E785 Hyperlipidemia, unspecified: Secondary | ICD-10-CM

## 2015-01-21 NOTE — Telephone Encounter (Signed)
Relation to pt: self Call back number: [phone]   Reason for call:  Pt scheduled lab appointment for 02/10/15 pt states she gets her labs checked every 3 months. Requesting orders, please advise

## 2015-01-21 NOTE — Telephone Encounter (Signed)
Orders in.Marland Kitchen     KP

## 2015-02-03 NOTE — Telephone Encounter (Signed)
Msg left to call the office     KP 

## 2015-02-08 MED ORDER — ALENDRONATE SODIUM 70 MG PO TABS: 70.0000 mg | ORAL_TABLET | ORAL | Status: DC

## 2015-02-08 NOTE — Telephone Encounter (Signed)
Copy mailed along with the RX.     KP

## 2015-02-10 ENCOUNTER — Other Ambulatory Visit (INDEPENDENT_AMBULATORY_CARE_PROVIDER_SITE_OTHER): Payer: BLUE CROSS/BLUE SHIELD

## 2015-02-10 DIAGNOSIS — E785 Hyperlipidemia, unspecified: Secondary | ICD-10-CM

## 2015-02-10 LAB — LIPID PANEL
Cholesterol: 199 mg/dL (ref 0–200)
HDL: 33.3 mg/dL — AB (ref >39.00)
LDL CALC: 135 mg/dL — AB (ref 0–99)
NonHDL: 165.7
Total CHOL/HDL Ratio: 6
Triglycerides: 154 mg/dL — ABNORMAL HIGH (ref 0.0–149.0)
VLDL: 30.8 mg/dL (ref 0.0–40.0)

## 2015-02-10 LAB — HEPATIC FUNCTION PANEL
ALBUMIN: 4 g/dL (ref 3.5–5.2)
ALT: 9 U/L (ref 0–35)
AST: 12 U/L (ref 0–37)
Alkaline Phosphatase: 83 U/L (ref 39–117)
Bilirubin, Direct: 0.1 mg/dL (ref 0.0–0.3)
Total Bilirubin: 0.3 mg/dL (ref 0.2–1.2)
Total Protein: 7 g/dL (ref 6.0–8.3)

## 2015-04-13 ENCOUNTER — Telehealth: Payer: Self-pay | Admitting: Family Medicine

## 2015-04-13 ENCOUNTER — Encounter: Payer: Self-pay | Admitting: Family Medicine

## 2015-04-13 ENCOUNTER — Ambulatory Visit (INDEPENDENT_AMBULATORY_CARE_PROVIDER_SITE_OTHER): Payer: BLUE CROSS/BLUE SHIELD | Admitting: Family Medicine

## 2015-04-13 VITALS — BP 129/56 | HR 80 | Temp 98.1°F | Wt 122.2 lb

## 2015-04-13 DIAGNOSIS — J0110 Acute frontal sinusitis, unspecified: Secondary | ICD-10-CM | POA: Diagnosis not present

## 2015-04-13 MED ORDER — FLUTICASONE PROPIONATE 50 MCG/ACT NA SUSP: 2.0000 | Freq: Every day | NASAL | Status: DC

## 2015-04-13 MED ORDER — CLARITHROMYCIN ER 500 MG PO TB24: 1000.0000 mg | ORAL_TABLET | Freq: Every day | ORAL | Status: AC

## 2015-04-13 NOTE — Telephone Encounter (Signed)
Patient was seen in the office 04/13/15 and wanted to schedule labs, patient states she's suppose to have labs every 3 month please advise

## 2015-04-13 NOTE — Patient Instructions (Addendum)
Hold lipitor while on biaxin      Sinusitis Sinusitis is redness, soreness, and inflammation of the paranasal sinuses. Paranasal sinuses are air pockets within the bones of your face (beneath the eyes, the middle of the forehead, or above the eyes). In healthy paranasal sinuses, mucus is able to drain out, and air is able to circulate through them by way of your nose. However, when your paranasal sinuses are inflamed, mucus and air can become trapped. This can allow bacteria and other germs to grow and cause infection. Sinusitis can develop quickly and last only a short time (acute) or continue over a long period (chronic). Sinusitis that lasts for more than 12 weeks is considered chronic.  CAUSES  Causes of sinusitis include:  Allergies.  Structural abnormalities, such as displacement of the cartilage that separates your nostrils (deviated septum), which can decrease the air flow through your nose and sinuses and affect sinus drainage.  Functional abnormalities, such as when the small hairs (cilia) that line your sinuses and help remove mucus do not work properly or are not present. SIGNS AND SYMPTOMS  Symptoms of acute and chronic sinusitis are the same. The primary symptoms are pain and pressure around the affected sinuses. Other symptoms include:  Upper toothache.  Earache.  Headache.  Bad breath.  Decreased sense of smell and taste.  A cough, which worsens when you are lying flat.  Fatigue.  Fever.  Thick drainage from your nose, which often is green and may contain pus (purulent).  Swelling and warmth over the affected sinuses. DIAGNOSIS  Your health care provider will perform a physical exam. During the exam, your health care provider may:  Look in your nose for signs of abnormal growths in your nostrils (nasal polyps).  Tap over the affected sinus to check for signs of infection.  View the inside of your sinuses (endoscopy) using an imaging device that has a  light attached (endoscope). If your health care provider suspects that you have chronic sinusitis, one or more of the following tests may be recommended:  Allergy tests.  Nasal culture. A sample of mucus is taken from your nose, sent to a lab, and screened for bacteria.  Nasal cytology. A sample of mucus is taken from your nose and examined by your health care provider to determine if your sinusitis is related to an allergy. TREATMENT  Most cases of acute sinusitis are related to a viral infection and will resolve on their own within 10 days. Sometimes medicines are prescribed to help relieve symptoms (pain medicine, decongestants, nasal steroid sprays, or saline sprays).  However, for sinusitis related to a bacterial infection, your health care provider will prescribe antibiotic medicines. These are medicines that will help kill the bacteria causing the infection.  Rarely, sinusitis is caused by a fungal infection. In theses cases, your health care provider will prescribe antifungal medicine. For some cases of chronic sinusitis, surgery is needed. Generally, these are cases in which sinusitis recurs more than 3 times per year, despite other treatments. HOME CARE INSTRUCTIONS   Drink plenty of water. Water helps thin the mucus so your sinuses can drain more easily.  Use a humidifier.  Inhale steam 3 to 4 times a day (for example, sit in the bathroom with the shower running).  Apply a warm, moist washcloth to your face 3 to 4 times a day, or as directed by your health care provider.  Use saline nasal sprays to help moisten and clean your sinuses.  Take  medicines only as directed by your health care provider.  If you were prescribed either an antibiotic or antifungal medicine, finish it all even if you start to feel better. SEEK IMMEDIATE MEDICAL CARE IF:  You have increasing pain or severe headaches.  You have nausea, vomiting, or drowsiness.  You have swelling around your  face.  You have vision problems.  You have a stiff neck.  You have difficulty breathing. MAKE SURE YOU:   Understand these instructions.  Will watch your condition.  Will get help right away if you are not doing well or get worse. Document Released: 07/17/2005 Document Revised: 12/01/2013 Document Reviewed: 08/01/2011 [facility] Patient Information 2015 [place], [place]. This information is not intended to replace advice given to you by your health care provider. Make sure you discuss any questions you have with your health care provider.

## 2015-04-13 NOTE — Progress Notes (Signed)
Subjective:     Leah [name] is a 69 y.o. female who presents for evaluation of sinus pain. Symptoms include: congestion, cough, facial pain, headaches, nasal congestion, purulent rhinorrhea, sinus pressure and + chills and ear pain. Onset of symptoms was 4 days ago. Symptoms have been gradually worsening since that time. Past history is significant for no history of pneumonia or bronchitis. Patient is a smoker.  The following portions of the patient's history were reviewed and updated as appropriate:  She  has a past medical history of Esophageal spasm; GERD (gastroesophageal reflux disease); Chronic gastritis; Diverticulosis; B12 deficiency; IBS (irritable bowel syndrome); Anxiety; Depression; Hiatal hernia; Iron deficiency anemia; Hyperlipidemia; Diverticulitis; Barrett esophagus; Rosacea; Interstitial cystitis; Hypertension; Hyperlipidemia; Chronic bronchitis; and Colon polyp (04/30/2013). She  does not have any pertinent problems on file. She  has past surgical history that includes Tonsillectomy and adenoidectomy; TMJ Arthroplasty; vocal cord polypectomy; Carpal tunnel release (1993); Abdominal hysterectomy; Cholecystectomy; Partial colectomy; Sinus surgery with Instatrak; laparoscopic appendectomy (N/A, 09/21/2013); Anterior fusion cervical spine (09/03/2014); and Anterior cervical decomp/discectomy fusion (N/A, 09/07/2014). Her family history includes Colon polyps in her mother; Diabetes in her father; Heart disease in her father; Hypertension in her father; Leukemia in her father; Pancreatic cancer in her father and mother; Prostate cancer in her father. There is no history of Colon cancer. She  reports that she has been smoking Cigarettes.  She has a 22.5 pack-year smoking history. She has never used smokeless tobacco. She reports that she does not drink alcohol or use illicit drugs. She has a current medication list which includes the following prescription(s): alendronate, atorvastatin,  esomeprazole, estradiol, ezetimibe, lisinopril, lorazepam, sertraline, beclomethasone, dicyclomine, lisinopril, lorazepam, and ondansetron. Current Outpatient Prescriptions on File Prior to Visit  Medication Sig Dispense Refill  . alendronate (FOSAMAX) 70 MG tablet Take 1 tablet (70 mg total) by mouth every 7 (seven) days. Take with a full glass of water on an empty stomach. 4 tablet 11  . atorvastatin (LIPITOR) 40 MG tablet Take 1 tablet (40 mg total) by mouth daily at 6 PM. 30 tablet 2  . esomeprazole (NEXIUM) 40 MG capsule Take 1 capsule (40 mg total) by mouth 2 (two) times daily before a meal. 180 capsule 3  . estradiol (VIVELLE-DOT) 0.1 MG/24HR Place 1 patch onto the skin 2 (two) times a week.      . ezetimibe (ZETIA) 10 MG tablet Take 1 tablet (10 mg total) by mouth daily. 90 tablet 1  . lisinopril (PRINIVIL,ZESTRIL) 5 MG tablet Take 1 tablet (5 mg total) by mouth daily.  0  . LORazepam (ATIVAN) 1 MG tablet Take 1 mg by mouth every 6 (six) hours as needed for anxiety.    . sertraline (ZOLOFT) 50 MG tablet Take 50 mg by mouth at bedtime.    . beclomethasone (QVAR) 80 MCG/ACT inhaler Inhale 2 puffs into the lungs 2 (two) times daily. (Patient not taking: Reported on 04/13/2015) 1 Inhaler 12  . dicyclomine (BENTYL) 10 MG capsule Take 1 capsule by mouth three times daily as needed for cramping and spasms. (Patient not taking: Reported on 04/13/2015) 30 capsule 0  . lisinopril (PRINIVIL,ZESTRIL) 5 MG tablet TAKE 1 TABLET (5 MG TOTAL) BY MOUTH DAILY. (Patient not taking: Reported on 04/13/2015) 90 tablet 1  . LORazepam (ATIVAN) 0.5 MG tablet Take 0.5 mg by mouth daily.  5  . ondansetron (ZOFRAN ODT) 4 MG disintegrating tablet Place 1 tab on the tongue every 6 hours as needed for nausea. (Patient not taking:  Reported on 04/13/2015) 40 tablet 0   No current facility-administered medications on file prior to visit.   She is allergic to amoxicillin-pot clavulanate; ciprofloxacin; keflex; moxifloxacin;  penicillins; sulfonamide derivatives; and prednisone..  Review of Systems Pertinent items are noted in HPI.   Objective:    BP 129/56 mmHg  Pulse 80  Temp(Src) 98.1 F (36.7 C)  Wt 122 lb 3.2 oz (55.43 kg)  SpO2 95% General appearance: alert, cooperative, appears stated age and no distress Ears: normal TM's and external ear canals both ears Nose: green discharge, moderate congestion, turbinates swollen, sinus tenderness bilateral Throat: lips, mucosa, and tongue normal; teeth and gums normal Neck: mild anterior cervical adenopathy, no JVD, supple, symmetrical, trachea midline and thyroid not enlarged, symmetric, no tenderness/mass/nodules Lungs: clear to auscultation bilaterally Heart: regular rate and rhythm, S1, S2 normal, no murmur, click, rub or gallop    Assessment:    Acute bacterial sinusitis.    Plan:    Nasal steroids per medication orders. Antihistamines per medication orders. Biaxin per medication orders.   Hold lipitor while on abx-- pt understands

## 2015-04-13 NOTE — Progress Notes (Signed)
Pre visit review using our clinic review tool, if applicable. No additional management support is needed unless otherwise documented below in the visit note. 

## 2015-04-14 NOTE — Telephone Encounter (Signed)
Notes Recorded by Rosalita Chessman, DO on 02/14/2015 at 4:08 PM Cholesterol--- LDL goal < 100, HDL >40, TG < 150. Diet and exercise will increase HDL and decrease LDL and TG. Fish, Fish Oil, Flaxseed oil will also help increase the HDL and decrease Triglycerides.  Recheck labs in 3 months----- If she is taking the lipitor and zetia-- refer to lipid clinic.   Left a message for call back.  Pt does need a lab appointment.

## 2015-04-15 MED ORDER — FENOFIBRATE 160 MG PO TABS: 160.0000 mg | ORAL_TABLET | Freq: Every day | ORAL | Status: DC

## 2015-04-15 NOTE — Telephone Encounter (Signed)
Spoke with patient and she said she wanted to try something for her triglycerides prior to going to the lipid clinic. Please advise       KP

## 2015-04-15 NOTE — Telephone Encounter (Signed)
Fenofibrate 160mg  1 po qd #30  2 refills recheck3 months

## 2015-04-15 NOTE — Telephone Encounter (Addendum)
Rx faxed. 6 mo follow up apt scheduled.    KP

## 2015-04-20 ENCOUNTER — Encounter: Payer: Self-pay | Admitting: Family Medicine

## 2015-04-20 ENCOUNTER — Ambulatory Visit (INDEPENDENT_AMBULATORY_CARE_PROVIDER_SITE_OTHER): Payer: BLUE CROSS/BLUE SHIELD | Admitting: Family Medicine

## 2015-04-20 VITALS — BP 118/60 | HR 55 | Temp 98.5°F | Wt 122.0 lb

## 2015-04-20 DIAGNOSIS — J019 Acute sinusitis, unspecified: Secondary | ICD-10-CM | POA: Diagnosis not present

## 2015-04-20 DIAGNOSIS — J329 Chronic sinusitis, unspecified: Secondary | ICD-10-CM | POA: Diagnosis not present

## 2015-04-20 MED ORDER — METHYLPREDNISOLONE ACETATE 80 MG/ML IJ SUSP
80.0000 mg | Freq: Once | INTRAMUSCULAR | Status: AC
  Administered 2015-04-20: 80 mg via INTRAMUSCULAR

## 2015-04-20 NOTE — Progress Notes (Signed)
Patient ID: Leah [name], female    DOB: 1946/07/01  Age: 69 y.o. MRN: [id number]    Subjective:  Subjective HPI Leah [name] presents for persistent sinusitis.  She is still taking abx and c/o con't symptoms.    Review of Systems  Constitutional: Positive for chills. Negative for fever.  HENT: Positive for congestion, ear pain, postnasal drip, rhinorrhea and sinus pressure.   Respiratory: Positive for cough. Negative for chest tightness, shortness of breath and wheezing.   Cardiovascular: Negative for chest pain, palpitations and leg swelling.  Allergic/Immunologic: Negative for environmental allergies.  Psychiatric/Behavioral: Negative for decreased concentration. The patient is not nervous/anxious.     History Past Medical History  Diagnosis Date  . Esophageal spasm   . GERD (gastroesophageal reflux disease)   . Chronic gastritis   . Diverticulosis   . B12 deficiency   . IBS (irritable bowel syndrome)   . Anxiety   . Depression   . Hiatal hernia   . Iron deficiency anemia   . Hyperlipidemia   . Diverticulitis   . Barrett esophagus   . Rosacea   . Interstitial cystitis   . Hypertension   . Hyperlipidemia   . Chronic bronchitis   . Colon polyp 04/30/2013    Tubular adenoma    She has past surgical history that includes Tonsillectomy and adenoidectomy; TMJ Arthroplasty; vocal cord polypectomy; Carpal tunnel release (1993); Abdominal hysterectomy; Cholecystectomy; Partial colectomy; Sinus surgery with Instatrak; laparoscopic appendectomy (N/A, 09/21/2013); Anterior fusion cervical spine (09/03/2014); and Anterior cervical decomp/discectomy fusion (N/A, 09/07/2014).   Her family history includes Colon polyps in her mother; Diabetes in her father; Heart disease in her father; Hypertension in her father; Leukemia in her father; Pancreatic cancer in her father and mother; Prostate cancer in her father. There is no history of Colon cancer.She reports that she has been  smoking Cigarettes.  She has a 22.5 pack-year smoking history. She has never used smokeless tobacco. She reports that she does not drink alcohol or use illicit drugs.  Current Outpatient Prescriptions on File Prior to Visit  Medication Sig Dispense Refill  . alendronate (FOSAMAX) 70 MG tablet Take 1 tablet (70 mg total) by mouth every 7 (seven) days. Take with a full glass of water on an empty stomach. 4 tablet 11  . atorvastatin (LIPITOR) 40 MG tablet Take 1 tablet (40 mg total) by mouth daily at 6 PM. 30 tablet 2  . clarithromycin (BIAXIN XL) 500 MG 24 hr tablet Take 2 tablets (1,000 mg total) by mouth daily. 28 tablet 0  . dicyclomine (BENTYL) 10 MG capsule Take 1 capsule by mouth three times daily as needed for cramping and spasms. 30 capsule 0  . esomeprazole (NEXIUM) 40 MG capsule Take 1 capsule (40 mg total) by mouth 2 (two) times daily before a meal. 180 capsule 3  . estradiol (VIVELLE-DOT) 0.1 MG/24HR Place 1 patch onto the skin 2 (two) times a week.      . ezetimibe (ZETIA) 10 MG tablet Take 1 tablet (10 mg total) by mouth daily. 90 tablet 1  . fenofibrate 160 MG tablet Take 1 tablet (160 mg total) by mouth daily. 30 tablet 2  . fluticasone (FLONASE) 50 MCG/ACT nasal spray Place 2 sprays into both nostrils daily. 16 g 6  . lisinopril (PRINIVIL,ZESTRIL) 5 MG tablet TAKE 1 TABLET (5 MG TOTAL) BY MOUTH DAILY. 90 tablet 1  . ondansetron (ZOFRAN ODT) 4 MG disintegrating tablet Place 1 tab on the tongue every 6 hours  as needed for nausea. 40 tablet 0  . sertraline (ZOLOFT) 50 MG tablet Take 50 mg by mouth at bedtime.    . beclomethasone (QVAR) 80 MCG/ACT inhaler Inhale 2 puffs into the lungs 2 (two) times daily. (Patient not taking: Reported on 04/13/2015) 1 Inhaler 12  . LORazepam (ATIVAN) 0.5 MG tablet Take 0.5 mg by mouth daily.  5   No current facility-administered medications on file prior to visit.     Objective:  Objective Physical Exam  Constitutional: She is oriented to person,  place, and time. She appears well-developed and well-nourished.  HENT:  Head: Normocephalic and atraumatic.  Right Ear: Tympanic membrane, external ear and ear canal normal.  Left Ear: Tympanic membrane, external ear and ear canal normal.  Nose: Right sinus exhibits maxillary sinus tenderness and frontal sinus tenderness. Left sinus exhibits maxillary sinus tenderness and frontal sinus tenderness.  Mouth/Throat: No posterior oropharyngeal edema or posterior oropharyngeal erythema.  + PND + errythema  Eyes: Conjunctivae and EOM are normal. Right eye exhibits no discharge. Left eye exhibits no discharge.  Neck: Normal range of motion. Neck supple. No JVD present. Carotid bruit is not present. No thyromegaly present.  Cardiovascular: Normal rate, regular rhythm and normal heart sounds.   No murmur heard. Pulmonary/Chest: Effort normal and breath sounds normal. No respiratory distress. She has no wheezes. She has no rales. She exhibits no tenderness.  Musculoskeletal: She exhibits no edema.  Lymphadenopathy:    She has cervical adenopathy.  Neurological: She is alert and oriented to person, place, and time.  Psychiatric: She has a normal mood and affect. Her behavior is normal.  Nursing note and vitals reviewed.  BP 118/60 mmHg  Pulse 55  Temp(Src) 98.5 F (36.9 C) (Oral)  Wt 122 lb (55.339 kg)  SpO2 96% Wt Readings from Last 3 Encounters:  04/20/15 122 lb (55.339 kg)  04/13/15 122 lb 3.2 oz (55.43 kg)  11/09/14 117 lb 9.6 oz (53.343 kg)     Lab Results  Component Value Date   WBC 5.0 11/09/2014   HGB 12.4 11/09/2014   HCT 36.9 11/09/2014   PLT 232.0 11/09/2014   GLUCOSE 105* 11/09/2014   CHOL 199 02/10/2015   TRIG 154.0* 02/10/2015   HDL 33.30* 02/10/2015   LDLDIRECT 172.0 12/20/2012   LDLCALC 135* 02/10/2015   ALT 9 02/10/2015   AST 12 02/10/2015   NA 138 11/09/2014   K 3.9 11/09/2014   CL 104 11/09/2014   CREATININE 0.81 11/09/2014   BUN 11 11/09/2014   CO2 30  11/09/2014   TSH 1.28 09/19/2011   INR 1.0 05/21/2009   HGBA1C 6.1 09/19/2011   MICROALBUR <0.7 11/09/2014    Dg Esophagus  10/06/2014   CLINICAL DATA:  Difficulty swallowing solid food and pills for years. Several dilatations.  EXAM: ESOPHOGRAM / BARIUM SWALLOW / BARIUM TABLET STUDY  TECHNIQUE: Combined double contrast and single contrast examination performed using effervescent crystals, thick barium liquid, and thin barium liquid. The patient was observed with fluoroscopy swallowing a 52mm barium sulphate tablet.  FLUOROSCOPY TIME:  2 minutes and 24 seconds  COMPARISON:  05/26/2010  FINDINGS: Hypo pharyngeal portion exam demonstrates C4-6 anterior fixation. Incomplete distension in this area (centered at the C5-6 level), including on image 5 of series 1.  Double contrast evaluation of the esophagus demonstrates no mucosal abnormality.  Evaluation of primary peristalsis demonstrates a normal primary peristaltic wave on each swallow.  Single-contrast evaluation esophagus demonstrates no distal narrowing to suggest stricture. A small hiatal  hernia.  A 13 mm barium tablet passes promptly.  IMPRESSION: 1. Status post anterior fixation thin the cervical spine. Hypopharyngeal evaluation demonstrating mild narrowing/incomplete distention at this level. Favored to be related to postsurgical changes. If indicated, this area could be directly visualized endoscopically. 2. Small hiatal hernia. 3. Otherwise, no explanation for patient's symptoms.   Electronically Signed   By: Abigail Miyamoto M.D.   On: 10/06/2014 17:09     Assessment & Plan:  Plan I am having Leah [name] maintain her estradiol, sertraline, beclomethasone, dicyclomine, ondansetron, atorvastatin, esomeprazole, lisinopril, LORazepam, ezetimibe, alendronate, clarithromycin, fluticasone, and fenofibrate. We administered methylPREDNISolone acetate.  Meds ordered this encounter  Medications  . methylPREDNISolone acetate (DEPO-MEDROL) injection 80 mg     Sig:     Problem List Items Addressed This Visit    Sinusitis, acute    Finish abx Depo medrol given rto prn       Relevant Medications   methylPREDNISolone acetate (DEPO-MEDROL) injection 80 mg (Completed)    Other Visit Diagnoses    Sinusitis, unspecified chronicity, unspecified location    -  Primary    Relevant Medications    methylPREDNISolone acetate (DEPO-MEDROL) injection 80 mg (Completed)       Follow-up: Return if symptoms worsen or fail to improve.  Garnet Koyanagi, DO

## 2015-04-20 NOTE — Progress Notes (Signed)
Pre visit review using our clinic review tool, if applicable. No additional management support is needed unless otherwise documented below in the visit note. 

## 2015-04-20 NOTE — Patient Instructions (Signed)

## 2015-04-21 NOTE — Assessment & Plan Note (Signed)
Finish abx Depo medrol given rto prn

## 2015-04-22 ENCOUNTER — Telehealth: Payer: Self-pay | Admitting: Family Medicine

## 2015-04-22 NOTE — Telephone Encounter (Signed)
Patient aware the note is ready for pick up.      KP

## 2015-04-22 NOTE — Telephone Encounter (Signed)
Ok to give note-- will need to be seen Monday if no better

## 2015-04-22 NOTE — Telephone Encounter (Signed)
°  Relation to CV:UDTH Call back number:[phone] Pharmacy:  Reason for call: pt states she was seen on Tuesday and dr. Etter Sjogren had written her a note to return to work today, pt states she is still not feeling any better and did not go to work today, pt wants to know if dr.lowne will approve her to return back to work on Monday and if she is not doing any better she will come back in to be seen on Monday.

## 2015-04-22 NOTE — Telephone Encounter (Signed)
Please advise      KP 

## 2015-04-27 ENCOUNTER — Ambulatory Visit (INDEPENDENT_AMBULATORY_CARE_PROVIDER_SITE_OTHER): Payer: BLUE CROSS/BLUE SHIELD | Admitting: Family Medicine

## 2015-04-27 ENCOUNTER — Encounter: Payer: Self-pay | Admitting: Family Medicine

## 2015-04-27 VITALS — BP 128/82 | HR 69 | Temp 98.2°F | Wt 121.6 lb

## 2015-04-27 DIAGNOSIS — M7500 Adhesive capsulitis of unspecified shoulder: Secondary | ICD-10-CM | POA: Insufficient documentation

## 2015-04-27 DIAGNOSIS — Z23 Encounter for immunization: Secondary | ICD-10-CM

## 2015-04-27 DIAGNOSIS — M62838 Other muscle spasm: Secondary | ICD-10-CM

## 2015-04-27 DIAGNOSIS — M7501 Adhesive capsulitis of right shoulder: Secondary | ICD-10-CM

## 2015-04-27 MED ORDER — TIZANIDINE HCL 4 MG PO TABS: 4.0000 mg | ORAL_TABLET | Freq: Four times a day (QID) | ORAL | Status: DC | PRN

## 2015-04-27 NOTE — Patient Instructions (Signed)

## 2015-04-27 NOTE — Progress Notes (Signed)
Patient ID: Leah [name], female    DOB: 1946/05/07  Age: 69 y.o. MRN: [id number]    Subjective:  Subjective HPI ROVENA HEARLD presents with c/o spasms in R shoulder.  She is being treated for frozen shoulder by Dr Tamera Punt [facility] and she has called them several times with no call back.    Review of Systems  Constitutional: Negative for diaphoresis, appetite change, fatigue and unexpected weight change.  Eyes: Negative for pain, redness and visual disturbance.  Respiratory: Negative for cough, chest tightness, shortness of breath and wheezing.   Cardiovascular: Negative for chest pain, palpitations and leg swelling.  Endocrine: Negative for cold intolerance, heat intolerance, polydipsia, polyphagia and polyuria.  Genitourinary: Negative for dysuria, frequency and difficulty urinating.  Musculoskeletal: Positive for myalgias and arthralgias. Negative for back pain, joint swelling, gait problem, neck pain and neck stiffness.  Neurological: Negative for dizziness, light-headedness, numbness and headaches.    History Past Medical History  Diagnosis Date  . Esophageal spasm   . GERD (gastroesophageal reflux disease)   . Chronic gastritis   . Diverticulosis   . B12 deficiency   . IBS (irritable bowel syndrome)   . Anxiety   . Depression   . Hiatal hernia   . Iron deficiency anemia   . Hyperlipidemia   . Diverticulitis   . Barrett esophagus   . Rosacea   . Interstitial cystitis   . Hypertension   . Hyperlipidemia   . Chronic bronchitis   . Colon polyp 04/30/2013    Tubular adenoma    She has past surgical history that includes Tonsillectomy and adenoidectomy; TMJ Arthroplasty; vocal cord polypectomy; Carpal tunnel release (1993); Abdominal hysterectomy; Cholecystectomy; Partial colectomy; Sinus surgery with [employer]; laparoscopic appendectomy (N/A, 09/21/2013); Anterior fusion cervical spine (09/03/2014); and Anterior cervical decomp/discectomy fusion (N/A,  09/07/2014).   Her family history includes Colon polyps in her mother; Diabetes in her father; Heart disease in her father; Hypertension in her father; Leukemia in her father; Pancreatic cancer in her father and mother; Prostate cancer in her father. There is no history of Colon cancer.She reports that she has been smoking Cigarettes.  She has a 22.5 pack-year smoking history. She has never used smokeless tobacco. She reports that she does not drink alcohol or use illicit drugs.  Current Outpatient Prescriptions on File Prior to Visit  Medication Sig Dispense Refill  . alendronate (FOSAMAX) 70 MG tablet Take 1 tablet (70 mg total) by mouth every 7 (seven) days. Take with a full glass of water on an empty stomach. 4 tablet 11  . atorvastatin (LIPITOR) 40 MG tablet Take 1 tablet (40 mg total) by mouth daily at 6 PM. 30 tablet 2  . beclomethasone (QVAR) 80 MCG/ACT inhaler Inhale 2 puffs into the lungs 2 (two) times daily. 1 Inhaler 12  . dicyclomine (BENTYL) 10 MG capsule Take 1 capsule by mouth three times daily as needed for cramping and spasms. 30 capsule 0  . esomeprazole (NEXIUM) 40 MG capsule Take 1 capsule (40 mg total) by mouth 2 (two) times daily before a meal. 180 capsule 3  . estradiol (VIVELLE-DOT) 0.1 MG/24HR Place 1 patch onto the skin 2 (two) times a week.      . ezetimibe (ZETIA) 10 MG tablet Take 1 tablet (10 mg total) by mouth daily. 90 tablet 1  . fenofibrate 160 MG tablet Take 1 tablet (160 mg total) by mouth daily. 30 tablet 2  . fluticasone (FLONASE) 50 MCG/ACT nasal spray Place 2 sprays  into both nostrils daily. 16 g 6  . lisinopril (PRINIVIL,ZESTRIL) 5 MG tablet TAKE 1 TABLET (5 MG TOTAL) BY MOUTH DAILY. 90 tablet 1  . LORazepam (ATIVAN) 0.5 MG tablet Take 0.5 mg by mouth daily.  5  . ondansetron (ZOFRAN ODT) 4 MG disintegrating tablet Place 1 tab on the tongue every 6 hours as needed for nausea. 40 tablet 0  . sertraline (ZOLOFT) 50 MG tablet Take 50 mg by mouth at bedtime.      No current facility-administered medications on file prior to visit.     Objective:  Objective Physical Exam  Constitutional: She is oriented to person, place, and time. She appears well-developed and well-nourished.  Musculoskeletal: She exhibits tenderness.       Arms: Neurological: She is alert and oriented to person, place, and time.  Psychiatric: She has a normal mood and affect. Her behavior is normal.  Nursing note and vitals reviewed.  BP 128/82 mmHg  Pulse 69  Temp(Src) 98.2 F (36.8 C) (Oral)  Wt 121 lb 9.6 oz (55.157 kg)  SpO2 97% Wt Readings from Last 3 Encounters:  04/27/15 121 lb 9.6 oz (55.157 kg)  04/20/15 122 lb (55.339 kg)  04/13/15 122 lb 3.2 oz (55.43 kg)     Lab Results  Component Value Date   WBC 5.0 11/09/2014   HGB 12.4 11/09/2014   HCT 36.9 11/09/2014   PLT 232.0 11/09/2014   GLUCOSE 105* 11/09/2014   CHOL 199 02/10/2015   TRIG 154.0* 02/10/2015   HDL 33.30* 02/10/2015   LDLDIRECT 172.0 12/20/2012   LDLCALC 135* 02/10/2015   ALT 9 02/10/2015   AST 12 02/10/2015   NA 138 11/09/2014   K 3.9 11/09/2014   CL 104 11/09/2014   CREATININE 0.81 11/09/2014   BUN 11 11/09/2014   CO2 30 11/09/2014   TSH 1.28 09/19/2011   INR 1.0 05/21/2009   HGBA1C 6.1 09/19/2011   MICROALBUR <0.7 11/09/2014    Dg Esophagus  10/06/2014   CLINICAL DATA:  Difficulty swallowing solid food and pills for years. Several dilatations.  EXAM: ESOPHOGRAM / BARIUM SWALLOW / BARIUM TABLET STUDY  TECHNIQUE: Combined double contrast and single contrast examination performed using effervescent crystals, thick barium liquid, and thin barium liquid. The patient was observed with fluoroscopy swallowing a 24mm barium sulphate tablet.  FLUOROSCOPY TIME:  2 minutes and 24 seconds  COMPARISON:  05/26/2010  FINDINGS: Hypo pharyngeal portion exam demonstrates C4-6 anterior fixation. Incomplete distension in this area (centered at the C5-6 level), including on image 5 of series 1.  Double  contrast evaluation of the esophagus demonstrates no mucosal abnormality.  Evaluation of primary peristalsis demonstrates a normal primary peristaltic wave on each swallow.  Single-contrast evaluation esophagus demonstrates no distal narrowing to suggest stricture. A small hiatal hernia.  A 13 mm barium tablet passes promptly.  IMPRESSION: 1. Status post anterior fixation thin the cervical spine. Hypopharyngeal evaluation demonstrating mild narrowing/incomplete distention at this level. Favored to be related to postsurgical changes. If indicated, this area could be directly visualized endoscopically. 2. Small hiatal hernia. 3. Otherwise, no explanation for patient's symptoms.   Electronically Signed   By: Abigail Miyamoto M.D.   On: 10/06/2014 17:09     Assessment & Plan:  Plan I am having Ms. Kabler start on tiZANidine. I am also having her maintain her estradiol, sertraline, beclomethasone, dicyclomine, ondansetron, atorvastatin, esomeprazole, lisinopril, LORazepam, ezetimibe, alendronate, fluticasone, and fenofibrate.  Meds ordered this encounter  Medications  . tiZANidine (ZANAFLEX) 4 MG  tablet    Sig: Take 1 tablet (4 mg total) by mouth every 6 (six) hours as needed for muscle spasms.    Dispense:  30 tablet    Refill:  0    Problem List Items Addressed This Visit    Frozen shoulder    Per ortho zanaflex  rto prn      Relevant Medications   tiZANidine (ZANAFLEX) 4 MG tablet    Other Visit Diagnoses    Muscle spasm    -  Primary    Relevant Medications    tiZANidine (ZANAFLEX) 4 MG tablet       Follow-up: Return if symptoms worsen or fail to improve, for as scheduled.  Garnet Koyanagi, DO

## 2015-04-27 NOTE — Assessment & Plan Note (Signed)
Per ortho zanaflex  rto prn

## 2015-04-27 NOTE — Progress Notes (Signed)
Pre visit review using our clinic review tool, if applicable. No additional management support is needed unless otherwise documented below in the visit note. 

## 2015-05-17 ENCOUNTER — Ambulatory Visit: Payer: BLUE CROSS/BLUE SHIELD | Admitting: Family Medicine

## 2015-06-01 ENCOUNTER — Other Ambulatory Visit: Payer: Self-pay | Admitting: Urology

## 2015-06-03 ENCOUNTER — Encounter (HOSPITAL_BASED_OUTPATIENT_CLINIC_OR_DEPARTMENT_OTHER): Payer: Self-pay | Admitting: *Deleted

## 2015-06-04 ENCOUNTER — Encounter (HOSPITAL_BASED_OUTPATIENT_CLINIC_OR_DEPARTMENT_OTHER): Payer: Self-pay | Admitting: *Deleted

## 2015-06-04 NOTE — Progress Notes (Signed)
NPO AFTER MN.  ARRIVE AT 1100.  NEEDS ISTAT 8 AND EKG.  WILL TAKE NEXIUM AM DOS W/ SIPS OF WATER AND START QVAR INHALER BID ON Sunday 06-06-2015 THRU AM DOS. PT VERBALIZED UNDERSTANDING.

## 2015-06-07 NOTE — H&P (Signed)
Active Problems Problems  1. Bladder pain (R39.89) 2. Chronic interstitial cystitis without hematuria (N30.10) 3. Urge and stress incontinence (N39.46)  History of Present Illness Leah [name] returns today in f/u for her history of IC. She was seen on 10/13 and got an anesthetic cocktail and then was back on 10/21 for DMSO. She had relief for 2-3 days and then the pain recurred. She has pain constantly and is not necessary affected by voiding. She has frequency q74min. She has nocturia x 1-2. She has SUI and UUI. She has no dysuria or hematuria. She has not had a cystoscopy or HOD since 1997.   Past Medical History Problems  1. History of Anxiety (F41.9) 2. History of Chronic Reflux Esophagitis 3. History of Diverticulosis (K57.90) 4. History of hypercholesterolemia (Z86.39)  Surgical History Problems  1. History of Cholecystectomy 2. History of Colon Surgery 3. History of Hysterectomy 4. History of Jaw Surgery 5. History of Neuroplasty Decompression Median Nerve At Carpal Tunnel 6. History of Sinus Surgery 7. History of Tonsillectomy  Current Meds 1. Anesthetic Cocktail - Bladder Instillation; DMSO cocktail; To Be Done: [id number];  Status: HOLD FOR - Administration Ordered 2. Ativan 1 MG Oral Tablet;  Therapy: (Recorded:23Oct2009) to Recorded 3. Lipitor 40 MG Oral Tablet;  Therapy: (Recorded:15Jun2016) to Recorded 4. Lisinopril 5 MG Oral Tablet;  Therapy: (Recorded:15Jun2016) to Recorded 5. Phenazopyridine HCl - 200 MG Oral Tablet; TAKE 1 TABLET BY MOUTH 3 TIMES A DAY AS  NEEDED FORBURNING;  Therapy: [id number] to (Evaluate:02Dec2014)  Requested for: 01SWF0932; Last  Rx:12Nov2014 Ordered 6. Rimso-50 50 % Intravesical Solution; Rimso 50 ml with 8.4% Sodium Bicarb 50 ml      for  total of 100 ml mixed together;  Therapy: [phone] to (Last Rx:13Oct2016); Status: ACTIVE - Retrospective  Authorization Ordered 7. Simvastatin 20 MG Oral Tablet;  Therapy: 54YHC6237 to  Recorded 8. Uribel 118 MG Oral Capsule; TAKE 1 CAPSULE 4 times daily;  Therapy: [id number] to (Last [phone]) Ordered 9. Vivelle-Dot 0.1 MG/24HR Transdermal Patch Twice Weekly;  Therapy: 94WNI6270 to Recorded 10. Zetia 10 MG Oral Tablet;   Therapy: (Recorded:15Jun2016) to Recorded 11. Zoloft 50 MG Oral Tablet;   Therapy: (Recorded:23Oct2009) to Recorded  Allergies Medication  1. Penicillins 2. Sulfa Drugs 3. Ciprofloxacin HCl TABS 4. Augmentin 5. Avelox TABS 6. Cipro  Family History Problems  1. Family history of pancreatic cancer (Z80.0) : Mother, Father 2. Family history of prostate cancer (Z80.42) : Father 3. Family history of tuberculosis (Z5.1) : Grandparent  Social History Problems  1. Denied: History of Alcohol Use (History) 2. Caffeine Use   2 cups a day 3. Current every day smoker (F17.200) 4. Marital History - Currently Married 5. Number of children   1 son, 2 dtrs. 6. Occupation:   medical leave 7. Tobacco Use   1/2 ppd after a brief period of quitting in 2009.  She was a 1ppd smoker prior to that for     40 years.  Review of Systems  Gastrointestinal: no diarrhea and no constipation.  Constitutional: no fever.    Vitals Vital Signs [Data Includes: Last 1 Day]  Recorded: [id number] 08:14AM  Blood Pressure: 154 / 81 Temperature: 97.1 F Heart Rate: 70  Physical Exam Constitutional: Well nourished and well developed . No acute distress.  ENT:. The ears and nose are normal in appearance.  Neck: The appearance of the neck is normal and no neck mass is present.  Pulmonary: No respiratory distress and normal respiratory rhythm and effort.  Cardiovascular:  Heart rate and rhythm are normal . No peripheral edema.  Abdomen: The abdomen is soft and nontender. No masses are palpated. No CVA tenderness. No hernias are palpable. No hepatosplenomegaly noted.  Genitourinary:   Examination of the external genitalia shows vulvar atrophy. The urethra is  normal in appearance. Urethral hypermobility is not present. Vaginal exam demonstrates atrophy, tenderness (She has some tenderness in the levators bilaterally but no posterior tenderness. She is most tender over the bladder. ) and the vaginal epithelium to be poorly estrogenized. No cystocele is identified. No rectocele is identified. The cervix is is absent. The uterus is absent. The bladder is tender, but normal on palpation and not distended. Postvoid residual urine is mL. The anus is normal on inspection. The perineum is normal on inspection.  Lymphatics: The femoral and inguinal nodes are not enlarged or tender.  Skin: Normal skin turgor, no visible rash and no visible skin lesions.  Neuro/Psych:. Mood and affect are appropriate.    Results/Data Urine [Data Includes: Last 1 Day]   [id number]  COLOR YELLOW   APPEARANCE CLEAR   SPECIFIC GRAVITY 1.025   pH 5.5   GLUCOSE NEGATIVE   BILIRUBIN NEGATIVE   KETONE NEGATIVE   BLOOD NEGATIVE   PROTEIN NEGATIVE   NITRITE NEGATIVE   LEUKOCYTE ESTERASE NEGATIVE    UA reviewed.    Procedure She was instilled intravesically with 21ml of 0.5% marcaine. She had only minimal relief with the instillation.     Assessment Assessed  1. Chronic interstitial cystitis without hematuria (N30.10) 2. Bladder pain (R39.89) 3. Urge and stress incontinence (N39.46)  She has a history of IC with a painful bladder that was very tender on exam today.  She only got minimal relief with an anesthetic cocktail.   Plan Bladder pain  1. Renew: Uribel 118 MG Oral Capsule; TAKE 1 CAPSULE 4 times daily Chronic interstitial cystitis without hematuria  2. Follow-up Schedule Surgery Office  Follow-up  Status: Hold For - Appointment   Requested for: [id number] 3. Pelvic Exam; Status:Hold For - Manual Activation; Requested for:01Nov2016;  4. PVR U/S; Status:Complete;   Done: 97LGX2119 Health Maintenance  5. UA With REFLEX; [Do Not Release]; Status:Resulted -  Requires Verification;   Done:  41DEY8144 07:58AM  I discussed the treatment options including a trial of elavil, a series of DMSO treatments or cystoscopy with HOD.  She is concerned about the side effects of the elavil and the cost of the DMSO so we will proceed with cystoscopy and HOD. I reviewed the risks of bleeding, infection, bladder injury, difficulty voiding, thrombotic events and anesthetic complications.  I have given her Uribel samples.

## 2015-06-08 ENCOUNTER — Ambulatory Visit (HOSPITAL_BASED_OUTPATIENT_CLINIC_OR_DEPARTMENT_OTHER): Payer: BLUE CROSS/BLUE SHIELD | Admitting: Anesthesiology

## 2015-06-08 ENCOUNTER — Encounter (HOSPITAL_BASED_OUTPATIENT_CLINIC_OR_DEPARTMENT_OTHER): Payer: Self-pay

## 2015-06-08 ENCOUNTER — Ambulatory Visit (HOSPITAL_BASED_OUTPATIENT_CLINIC_OR_DEPARTMENT_OTHER)
Admission: RE | Admit: 2015-06-08 | Discharge: 2015-06-08 | Disposition: A | Discharge status: Home / Self Care | Payer: BLUE CROSS/BLUE SHIELD | Source: Ambulatory Visit | Attending: Urology | Admitting: Urology

## 2015-06-08 ENCOUNTER — Other Ambulatory Visit: Payer: Self-pay

## 2015-06-08 ENCOUNTER — Encounter (HOSPITAL_BASED_OUTPATIENT_CLINIC_OR_DEPARTMENT_OTHER)
Admission: RE | Disposition: A | Discharge status: Home / Self Care | Payer: Self-pay | Source: Ambulatory Visit | Attending: Urology

## 2015-06-08 DIAGNOSIS — J449 Chronic obstructive pulmonary disease, unspecified: Secondary | ICD-10-CM | POA: Insufficient documentation

## 2015-06-08 DIAGNOSIS — K449 Diaphragmatic hernia without obstruction or gangrene: Secondary | ICD-10-CM | POA: Insufficient documentation

## 2015-06-08 DIAGNOSIS — N3946 Mixed incontinence: Secondary | ICD-10-CM | POA: Diagnosis not present

## 2015-06-08 DIAGNOSIS — E7800 Pure hypercholesterolemia, unspecified: Secondary | ICD-10-CM | POA: Diagnosis not present

## 2015-06-08 DIAGNOSIS — F419 Anxiety disorder, unspecified: Secondary | ICD-10-CM | POA: Insufficient documentation

## 2015-06-08 DIAGNOSIS — F17200 Nicotine dependence, unspecified, uncomplicated: Secondary | ICD-10-CM | POA: Diagnosis not present

## 2015-06-08 DIAGNOSIS — K210 Gastro-esophageal reflux disease with esophagitis: Secondary | ICD-10-CM | POA: Diagnosis not present

## 2015-06-08 DIAGNOSIS — G709 Myoneural disorder, unspecified: Secondary | ICD-10-CM | POA: Diagnosis not present

## 2015-06-08 DIAGNOSIS — N3010 Interstitial cystitis (chronic) without hematuria: Secondary | ICD-10-CM | POA: Insufficient documentation

## 2015-06-08 DIAGNOSIS — F329 Major depressive disorder, single episode, unspecified: Secondary | ICD-10-CM | POA: Insufficient documentation

## 2015-06-08 DIAGNOSIS — Z79899 Other long term (current) drug therapy: Secondary | ICD-10-CM | POA: Insufficient documentation

## 2015-06-08 DIAGNOSIS — M1990 Unspecified osteoarthritis, unspecified site: Secondary | ICD-10-CM | POA: Insufficient documentation

## 2015-06-08 DIAGNOSIS — I10 Essential (primary) hypertension: Secondary | ICD-10-CM | POA: Diagnosis not present

## 2015-06-08 HISTORY — DX: Other complications of anesthesia, initial encounter: T88.59XA

## 2015-06-08 HISTORY — DX: Other specified cough: R05.8

## 2015-06-08 HISTORY — DX: Frequency of micturition: R35.0

## 2015-06-08 HISTORY — DX: Personal history of other diseases of the digestive system: Z87.19

## 2015-06-08 HISTORY — DX: Cough: R05

## 2015-06-08 HISTORY — DX: Other symptoms and signs involving the genitourinary system: R39.89

## 2015-06-08 HISTORY — DX: Urgency of urination: R39.15

## 2015-06-08 HISTORY — DX: Simple chronic bronchitis: J41.0

## 2015-06-08 HISTORY — DX: Adverse effect of unspecified anesthetic, initial encounter: T41.45XA

## 2015-06-08 HISTORY — DX: Other specified postprocedural states: Z98.890

## 2015-06-08 HISTORY — PX: CYSTO WITH HYDRODISTENSION: SHX5453

## 2015-06-08 HISTORY — DX: Personal history of colonic polyps: Z86.010

## 2015-06-08 HISTORY — DX: Personal history of adenomatous and serrated colon polyps: Z86.0101

## 2015-06-08 LAB — POCT I-STAT, CHEM 8
BUN: 14 mg/dL (ref 6–20)
CHLORIDE: 106 mmol/L (ref 101–111)
CREATININE: 0.8 mg/dL (ref 0.44–1.00)
Calcium, Ion: 1.11 mmol/L — ABNORMAL LOW (ref 1.13–1.30)
Glucose, Bld: 93 mg/dL (ref 65–99)
HEMATOCRIT: 41 % (ref 36.0–46.0)
HEMOGLOBIN: 13.9 g/dL (ref 12.0–15.0)
POTASSIUM: 4.4 mmol/L (ref 3.5–5.1)
Sodium: 140 mmol/L (ref 135–145)
TCO2: 22 mmol/L (ref 0–100)

## 2015-06-08 SURGERY — CYSTOSCOPY, WITH BLADDER HYDRODISTENSION
Anesthesia: General | Site: Bladder

## 2015-06-08 MED ORDER — MIDAZOLAM HCL 5 MG/5ML IJ SOLN
INTRAMUSCULAR | Status: DC | PRN
  Administered 2015-06-08 (×2): 1 mg via INTRAVENOUS

## 2015-06-08 MED ORDER — ONDANSETRON HCL 4 MG/2ML IJ SOLN
INTRAMUSCULAR | Status: AC
  Filled 2015-06-08: qty 2

## 2015-06-08 MED ORDER — LIDOCAINE HCL (CARDIAC) 20 MG/ML IV SOLN
INTRAVENOUS | Status: DC | PRN
  Administered 2015-06-08: 80 mg via INTRAVENOUS

## 2015-06-08 MED ORDER — HYDROCODONE-ACETAMINOPHEN 5-325 MG PO TABS: 1.0000 | ORAL_TABLET | Freq: Four times a day (QID) | ORAL | Status: DC | PRN

## 2015-06-08 MED ORDER — FENTANYL CITRATE (PF) 100 MCG/2ML IJ SOLN
INTRAMUSCULAR | Status: AC
  Filled 2015-06-08: qty 4

## 2015-06-08 MED ORDER — BUPIVACAINE HCL (PF) 0.5 % IJ SOLN
INTRAMUSCULAR | Status: DC | PRN
  Administered 2015-06-08: 12:00:00 via INTRAVESICAL

## 2015-06-08 MED ORDER — PHENAZOPYRIDINE HCL 200 MG PO TABS: 200.0000 mg | ORAL_TABLET | Freq: Three times a day (TID) | ORAL | Status: DC | PRN

## 2015-06-08 MED ORDER — MIDAZOLAM HCL 2 MG/2ML IJ SOLN
INTRAMUSCULAR | Status: AC
  Filled 2015-06-08: qty 4

## 2015-06-08 MED ORDER — PROPOFOL 10 MG/ML IV BOLUS
INTRAVENOUS | Status: AC
  Filled 2015-06-08: qty 20

## 2015-06-08 MED ORDER — DEXTROSE 5 % IV SOLN
5.0000 mg/kg | INTRAVENOUS | Status: AC
  Administered 2015-06-08: 270 mg via INTRAVENOUS
  Filled 2015-06-08: qty 6.75

## 2015-06-08 MED ORDER — KETOROLAC TROMETHAMINE 30 MG/ML IJ SOLN
INTRAMUSCULAR | Status: AC
  Filled 2015-06-08: qty 1

## 2015-06-08 MED ORDER — ONDANSETRON HCL 4 MG/2ML IJ SOLN
INTRAMUSCULAR | Status: DC | PRN
  Administered 2015-06-08: 4 mg via INTRAVENOUS

## 2015-06-08 MED ORDER — PROMETHAZINE HCL 25 MG/ML IJ SOLN
6.2500 mg | INTRAMUSCULAR | Status: DC | PRN
  Filled 2015-06-08: qty 1

## 2015-06-08 MED ORDER — LACTATED RINGERS IV SOLN
INTRAVENOUS | Status: DC
  Administered 2015-06-08: 11:00:00 via INTRAVENOUS
  Filled 2015-06-08: qty 1000

## 2015-06-08 MED ORDER — GENTAMICIN SULFATE 40 MG/ML IJ SOLN
5.0000 mg/kg | INTRAVENOUS | Status: DC
  Filled 2015-06-08: qty 7

## 2015-06-08 MED ORDER — PROPOFOL 10 MG/ML IV BOLUS
INTRAVENOUS | Status: DC | PRN
  Administered 2015-06-08: 160 mg via INTRAVENOUS

## 2015-06-08 MED ORDER — FENTANYL CITRATE (PF) 100 MCG/2ML IJ SOLN
INTRAMUSCULAR | Status: DC | PRN
  Administered 2015-06-08 (×4): 25 ug via INTRAVENOUS

## 2015-06-08 MED ORDER — KETOROLAC TROMETHAMINE 30 MG/ML IJ SOLN
INTRAMUSCULAR | Status: DC | PRN
  Administered 2015-06-08: 30 mg via INTRAVENOUS

## 2015-06-08 MED ORDER — LIDOCAINE HCL (CARDIAC) 20 MG/ML IV SOLN
INTRAVENOUS | Status: AC
Start: 2015-06-08 — End: 2015-06-08
  Filled 2015-06-08: qty 5

## 2015-06-08 MED ORDER — FENTANYL CITRATE (PF) 100 MCG/2ML IJ SOLN
25.0000 ug | INTRAMUSCULAR | Status: DC | PRN
  Filled 2015-06-08: qty 1

## 2015-06-08 MED ORDER — STERILE WATER FOR IRRIGATION IR SOLN
Status: DC | PRN
  Administered 2015-06-08: 3000 mL via INTRAVESICAL

## 2015-06-08 SURGICAL SUPPLY — 18 items
BAG DRAIN URO-CYSTO SKYTR STRL (DRAIN) ×2 IMPLANT
CANISTER SUCT LVC 12 LTR MEDI- (MISCELLANEOUS) IMPLANT
CATH ROBINSON RED A/P 16FR (CATHETERS) ×2 IMPLANT
CLOTH BEACON ORANGE TIMEOUT ST (SAFETY) ×2 IMPLANT
ELECT REM PT RETURN 9FT ADLT (ELECTROSURGICAL) ×2
ELECTRODE REM PT RTRN 9FT ADLT (ELECTROSURGICAL) ×1 IMPLANT
GLOVE SURG SS PI 8.0 STRL IVOR (GLOVE) ×2 IMPLANT
GOWN STRL REUS W/ TWL XL LVL3 (GOWN DISPOSABLE) ×1 IMPLANT
GOWN STRL REUS W/TWL XL LVL3 (GOWN DISPOSABLE) ×1
KIT ROOM TURNOVER WOR (KITS) ×2 IMPLANT
MANIFOLD NEPTUNE II (INSTRUMENTS) IMPLANT
NDL SAFETY ECLIPSE 18X1.5 (NEEDLE) ×1 IMPLANT
NEEDLE HYPO 18GX1.5 SHARP (NEEDLE) ×1
NS IRRIG 500ML POUR BTL (IV SOLUTION) IMPLANT
PACK CYSTO (CUSTOM PROCEDURE TRAY) ×2 IMPLANT
SYR 30ML LL (SYRINGE) ×2 IMPLANT
TUBE CONNECTING 12X1/4 (SUCTIONS) IMPLANT
WATER STERILE IRR 3000ML UROMA (IV SOLUTION) ×2 IMPLANT

## 2015-06-08 NOTE — Interval H&P Note (Signed)
History and Physical Interval Note:  06/08/2015 11:24 AM  Leah [name]  has presented today for surgery, with the diagnosis of INTERSTITIAL CYSTITIS  The various methods of treatment have been discussed with the patient and family. After consideration of risks, benefits and other options for treatment, the patient has consented to  Procedure(s): CYSTOSCOPY/HYDRODISTENSION INSTILLATION OF MARCAINE AND PYRIDIUM (N/A) as a surgical intervention .  The patient's history has been reviewed, patient examined, no change in status, stable for surgery.  I have reviewed the patient's chart and labs.  Questions were answered to the patient's satisfaction.     Jerran Tappan J

## 2015-06-08 NOTE — Brief Op Note (Signed)
06/08/2015  12:04 PM  PATIENT:  Leah [name]  69 y.o. female  PRE-OPERATIVE DIAGNOSIS:  INTERSTITIAL CYSTITIS  POST-OPERATIVE DIAGNOSIS:  INTERSTITIAL CYSTITIS  PROCEDURE:  Procedure(s): CYSTOSCOPY/HYDRODISTENSION INSTILLATION OF MARCAINE AND PYRIDIUM (N/A)  SURGEON:  Surgeon(s) and Role:    * Irine Seal, MD - Primary  PHYSICIAN ASSISTANT:   ASSISTANTS: none   ANESTHESIA:   general  EBL:  Total I/O In: 200 [I.V.:200] Out: 700 [Urine:700]  BLOOD ADMINISTERED:none  DRAINS: none   LOCAL MEDICATIONS USED:  MARCAINE     SPECIMEN:  No Specimen  DISPOSITION OF SPECIMEN:  N/A  COUNTS:  YES  TOURNIQUET:  * No tourniquets in log *  DICTATION: .Other Dictation: Dictation Number [id number]  PLAN OF CARE: Discharge to home after PACU  PATIENT DISPOSITION:  PACU - hemodynamically stable.   Delay start of Pharmacological VTE agent (>24hrs) due to surgical blood loss or risk of bleeding: not applicable

## 2015-06-08 NOTE — Anesthesia Preprocedure Evaluation (Signed)
Anesthesia Evaluation  Patient identified by MRN, date of birth, ID band Patient awake    Reviewed: Allergy & Precautions, NPO status , Patient's Chart, lab work & pertinent test results  History of Anesthesia Complications (+) history of anesthetic complications  Airway Mallampati: II  TM Distance: >3 FB Neck ROM: Limited    Dental no notable dental hx.    Pulmonary COPD, Current Smoker,    Pulmonary exam normal breath sounds clear to auscultation       Cardiovascular hypertension, Pt. on medications Normal cardiovascular exam Rhythm:Regular Rate:Normal     Neuro/Psych PSYCHIATRIC DISORDERS Anxiety Depression  Neuromuscular disease    GI/Hepatic Neg liver ROS, hiatal hernia, GERD  Medicated,  Endo/Other  negative endocrine ROS  Renal/GU negative Renal ROS  negative genitourinary   Musculoskeletal  (+) Arthritis ,   Abdominal   Peds negative pediatric ROS (+)  Hematology  (+) anemia ,   Anesthesia Other Findings   Reproductive/Obstetrics negative OB ROS                             Anesthesia Physical Anesthesia Plan  ASA: II  Anesthesia Plan: General   Post-op Pain Management:    Induction: Intravenous  Airway Management Planned: LMA  Additional Equipment:   Intra-op Plan:   Post-operative Plan: Extubation in OR  Informed Consent: I have reviewed the patients History and Physical, chart, labs and discussed the procedure including the risks, benefits and alternatives for the proposed anesthesia with the patient or authorized representative who has indicated his/her understanding and acceptance.   Dental advisory given  Plan Discussed with: CRNA  Anesthesia Plan Comments:         Anesthesia Quick Evaluation

## 2015-06-08 NOTE — Anesthesia Postprocedure Evaluation (Signed)
  Anesthesia Post-op Note  Patient: Leah [name]  Procedure(s) Performed: Procedure(s) (LRB): CYSTOSCOPY/HYDRODISTENSION INSTILLATION OF MARCAINE AND PYRIDIUM (N/A)  Patient Location: PACU  Anesthesia Type: general  Level of Consciousness: awake and alert   Airway and Oxygen Therapy: Patient Spontanous Breathing  Post-op Pain: mild  Post-op Assessment: Post-op Vital signs reviewed, Patient's Cardiovascular Status Stable, Respiratory Function Stable, Patent Airway and No signs of Nausea or vomiting  Last Vitals:  Filed Vitals:   06/08/15 1345  BP: 117/60  Pulse: 69  Temp: 36.5 C  Resp: 18    Post-op Vital Signs: stable   Complications: No apparent anesthesia complications

## 2015-06-08 NOTE — Transfer of Care (Signed)
Immediate Anesthesia Transfer of Care Note  Patient: Leah [name]  Procedure(s) Performed: Procedure(s) (LRB): CYSTOSCOPY/HYDRODISTENSION INSTILLATION OF MARCAINE AND PYRIDIUM (N/A)  Patient Location: PACU  Anesthesia Type: General  Level of Consciousness: awake, sedated, patient cooperative and responds to stimulation  Airway & Oxygen Therapy: Patient Spontanous Breathing and Patient connected to face mask oxygen  Post-op Assessment: Report given to PACU RN, Post -op Vital signs reviewed and stable and Patient moving all extremities  Post vital signs: Reviewed and stable  Complications: No apparent anesthesia complications

## 2015-06-08 NOTE — Discharge Instructions (Addendum)
CYSTOSCOPY HOME CARE INSTRUCTIONS ° °Activity: °Rest for the remainder of the day.  Do not drive or operate equipment today.  You may resume normal activities in one to two days as instructed by your physician.  ° °Meals: °Drink plenty of liquids and eat light foods such as gelatin or soup this evening.  You may return to a normal meal plan tomorrow. ° °Return to Work: °You may return to work in one to two days or as instructed by your physician. ° °Special Instructions / Symptoms: °Call your physician if any of these symptoms occur: ° ° -persistent or heavy bleeding ° -bleeding which continues after first few urination ° -large blood clots that are difficult to pass ° -urine stream diminishes or stops completely ° -fever equal to or higher than 101 degrees Farenheit. ° -cloudy urine with a strong, foul odor ° -severe pain ° °Females should always wipe from front to back after elimination.  You may feel some burning pain when you urinate.  This should disappear with time.  Applying moist heat to the lower abdomen or a hot tub bath may help relieve the pain. \ ° ° ° ° °Post Anesthesia Home Care Instructions ° °Activity: °Get plenty of rest for the remainder of the day. A responsible adult should stay with you for 24 hours following the procedure.  °For the next 24 hours, DO NOT: °-Drive a car °-Operate machinery °-Drink alcoholic beverages °-Take any medication unless instructed by your physician °-Make any legal decisions or sign important papers. ° °Meals: °Start with liquid foods such as gelatin or soup. Progress to regular foods as tolerated. Avoid greasy, spicy, heavy foods. If nausea and/or vomiting occur, drink only clear liquids until the nausea and/or vomiting subsides. Call your physician if vomiting continues. ° °Special Instructions/Symptoms: °Your throat may feel dry or sore from the anesthesia or the breathing tube placed in your throat during surgery. If this causes discomfort, gargle with warm salt  water. The discomfort should disappear within 24 hours. ° °If you had a scopolamine patch placed behind your ear for the management of post- operative nausea and/or vomiting: ° °1. The medication in the patch is effective for 72 hours, after which it should be removed.  Wrap patch in a tissue and discard in the trash. Wash hands thoroughly with soap and water. °2. You may remove the patch earlier than 72 hours if you experience unpleasant side effects which may include dry mouth, dizziness or visual disturbances. °3. Avoid touching the patch. Wash your hands with soap and water after contact with the patch. °  ° °

## 2015-06-08 NOTE — Anesthesia Procedure Notes (Signed)
Procedure Name: LMA Insertion Date/Time: 06/08/2015 11:50 AM Performed by: Justice Rocher Pre-anesthesia Checklist: Patient identified, Emergency Drugs available, Suction available and Patient being monitored Patient Re-evaluated:Patient Re-evaluated prior to inductionOxygen Delivery Method: Circle System Utilized Preoxygenation: Pre-oxygenation with 100% oxygen Intubation Type: IV induction Ventilation: Mask ventilation without difficulty LMA: LMA inserted LMA Size: 4.0 Number of attempts: 1 Airway Equipment and Method: Bite block Placement Confirmation: positive ETCO2 Tube secured with: Tape Dental Injury: Teeth and Oropharynx as per pre-operative assessment

## 2015-06-09 ENCOUNTER — Encounter (HOSPITAL_BASED_OUTPATIENT_CLINIC_OR_DEPARTMENT_OTHER): Payer: Self-pay | Admitting: Urology

## 2015-06-09 NOTE — Op Note (Signed)
Leah [name], Leah [name]            ACCOUNT NO.:  [id number]  MEDICAL RECORD NO.:  [id number]  LOCATION:                               FACILITY:  [facility]  PHYSICIAN:  Marshall Cork. Jeffie Pollock, M.D.    DATE OF BIRTH:  1946-03-01  DATE OF PROCEDURE:  06/08/2015 DATE OF DISCHARGE:  06/08/2015                              OPERATIVE REPORT   PROCEDURE:  Cystoscopy with hydrodistention of bladder, instillation of Pyridium and Marcaine.  PREOPERATIVE DIAGNOSIS:  Painful bladder with history of interstitial cystitis.  POSTOPERATIVE DIAGNOSIS:  Painful bladder with history of interstitial cystitis.  SURGEON:  Marshall Cork. Jeffie Pollock, M.D.  ANESTHESIA:  General.  SPECIMEN:  None.  DRAINS:  None.  ESTIMATED BLOOD LOSS:  None.  COMPLICATIONS:  None.  INDICATIONS:  [name] is a 69 year old white female with a history of interstitial cystitis who has failed conservative therapy and is to undergo hydrodistention of bladder.  FINDINGS AND PROCEDURE:  She was given gentamicin.  She was taken to the operating room where general anesthetic was induced.  She was placed in lithotomy position.  Her perineum and genitalia were prepped with Betadine solution.  She was draped in usual sterile fashion.  Cystoscopy was performed using the 23-French scope with 30-degree lens. Examination reveals a normal urethra.  The bladder wall had mild-to- moderate trabeculation without tumor, stones, or inflammation.  Ureteral orifices were unremarkable and were in their normal anatomic position.  The bladder was then distended to a capacity at 80 cm water pressure and drained.  The bladder capacity was 700 mL.  Terminal efflux was minimally pink.  Repeat cystoscopy after hydrodistention demonstrated glomerular hemorrhage in the area of the trigone, consistent with a diagnosis of interstitial cystitis.  No cracks, tears, or Hunner ulcers were noted.  After completion of cystoscopy, the bladder was drained and  then instilled with 30 mL of 0.5% Marcaine with 400 mg of crushed Pyridium. She was taken down from lithotomy position.  Her anesthetic was reversed.  She was moved to the recovery room in stable condition. There were no complications.     Marshall Cork. Jeffie Pollock, M.D.     JJW/MEDQ  D:  06/08/2015  T:  06/09/2015  Job:  [id number]

## 2015-06-15 ENCOUNTER — Ambulatory Visit: Payer: BLUE CROSS/BLUE SHIELD | Admitting: Family Medicine

## 2015-07-22 ENCOUNTER — Telehealth: Payer: Self-pay | Admitting: Family Medicine

## 2015-07-22 NOTE — Telephone Encounter (Signed)
Patient scheduled for 12/23.     KP

## 2015-07-22 NOTE — Telephone Encounter (Signed)
Follow up call made to patient. No answer left message.

## 2015-07-22 NOTE — Telephone Encounter (Signed)
She needs ov 

## 2015-07-22 NOTE — Telephone Encounter (Signed)
[facility] called and said patient was transferred to them for elevated BP 166/86, sinus pain, ear ache and dizziness. Pt was made aware to report to ER, pt declined to go

## 2015-07-22 NOTE — Telephone Encounter (Signed)
To MD to review.     KP 

## 2015-07-22 NOTE — Telephone Encounter (Signed)
Patient Name: Leah [name] DOB: Jan 11, 1946 Initial Comment [name] states bp is 156/86, sinuses stopped up, ear pain. Nurse Assessment Nurse: Vallery Sa, RN, Cathy Date/Time (Eastern Time): 07/22/2015 1:15:51 PM Confirm and document reason for call. If symptomatic, describe symptoms. ---[name] states that her blood pressure was 156/86 about 1.5 hours ago. She developed sinus pain and left earache yesterday. No fever. Alert and responsive. No severe breathing difficulty. Has the patient traveled out of the country within the last 30 days? ---No Does the patient have any new or worsening symptoms? ---Yes Will a triage be completed? ---Yes Related visit to physician within the last 2 weeks? ---No Does the PT have any chronic conditions? (i.e. diabetes, asthma, etc.) ---Yes List chronic conditions. ---High Blood Pressure and cholesterol Is this a behavioral health or substance abuse call? ---No Guidelines Guideline Title Affirmed Question Affirmed Notes High Blood Pressure [1] BP # 160 / 100 AND [2] cardiac or neurologic symptoms (e.g., chest pain, difficulty breathing, unsteady gait, blurred vision) Final Disposition User Go to ED Now Vallery Sa, RN, Cathy Comments [name] declined the Go to ER disposition. Reinforced the Go To ER disposition. She states she has an appointment with MD tomorrow and that she lives far from the ERs. Called the office backline and notified Jen. Referrals GO TO FACILITY REFUSED Disagree/Comply: Disagree Disagree/Comply Reason: Disagree with instructions

## 2015-07-23 ENCOUNTER — Encounter: Payer: Self-pay | Admitting: Family Medicine

## 2015-07-23 ENCOUNTER — Ambulatory Visit (INDEPENDENT_AMBULATORY_CARE_PROVIDER_SITE_OTHER): Payer: BLUE CROSS/BLUE SHIELD | Admitting: Family Medicine

## 2015-07-23 VITALS — BP 130/84 | HR 76 | Temp 98.4°F | Wt 119.6 lb

## 2015-07-23 DIAGNOSIS — IMO0001 Reserved for inherently not codable concepts without codable children: Secondary | ICD-10-CM

## 2015-07-23 DIAGNOSIS — H6593 Unspecified nonsuppurative otitis media, bilateral: Secondary | ICD-10-CM

## 2015-07-23 DIAGNOSIS — Z1159 Encounter for screening for other viral diseases: Secondary | ICD-10-CM

## 2015-07-23 DIAGNOSIS — I10 Essential (primary) hypertension: Secondary | ICD-10-CM

## 2015-07-23 DIAGNOSIS — J449 Chronic obstructive pulmonary disease, unspecified: Secondary | ICD-10-CM

## 2015-07-23 DIAGNOSIS — E785 Hyperlipidemia, unspecified: Secondary | ICD-10-CM | POA: Diagnosis not present

## 2015-07-23 LAB — LIPID PANEL
Cholesterol: 279 mg/dL — ABNORMAL HIGH (ref 125–200)
HDL: 48 mg/dL (ref >=46)
LDL CALC: 197 mg/dL — AB (ref <130)
TRIGLYCERIDES: 172 mg/dL — AB (ref <150)
Total CHOL/HDL Ratio: 5.8 Ratio — ABNORMAL HIGH (ref <=5.0)
VLDL: 34 mg/dL — ABNORMAL HIGH (ref <30)

## 2015-07-23 LAB — COMPREHENSIVE METABOLIC PANEL
ALBUMIN: 4.4 g/dL (ref 3.6–5.1)
ALT: 11 U/L (ref 6–29)
AST: 13 U/L (ref 10–35)
Alkaline Phosphatase: 73 U/L (ref 33–130)
BILIRUBIN TOTAL: 0.3 mg/dL (ref 0.2–1.2)
BUN: 15 mg/dL (ref 7–25)
CO2: 23 mmol/L (ref 20–31)
CREATININE: 0.86 mg/dL (ref 0.50–0.99)
Calcium: 9.7 mg/dL (ref 8.6–10.4)
Chloride: 105 mmol/L (ref 98–110)
Glucose, Bld: 93 mg/dL (ref 65–99)
Potassium: 3.6 mmol/L (ref 3.5–5.3)
SODIUM: 141 mmol/L (ref 135–146)
TOTAL PROTEIN: 7 g/dL (ref 6.1–8.1)

## 2015-07-23 MED ORDER — EZETIMIBE 10 MG PO TABS: 10.0000 mg | ORAL_TABLET | Freq: Every day | ORAL | Status: DC

## 2015-07-23 MED ORDER — LISINOPRIL 10 MG PO TABS
10.0000 mg | ORAL_TABLET | Freq: Every day | ORAL | Status: DC
Start: 2015-07-23 — End: 2016-03-30

## 2015-07-23 MED ORDER — LEVOCETIRIZINE DIHYDROCHLORIDE 5 MG PO TABS: 5.0000 mg | ORAL_TABLET | Freq: Every evening | ORAL | Status: DC

## 2015-07-23 MED ORDER — BECLOMETHASONE DIPROPIONATE 80 MCG/ACT IN AERS: 2.0000 | INHALATION_SPRAY | Freq: Two times a day (BID) | RESPIRATORY_TRACT | Status: DC

## 2015-07-23 NOTE — Patient Instructions (Signed)
Hypertension Hypertension, commonly called high blood pressure, is when the force of blood pumping through your arteries is too strong. Your arteries are the blood vessels that carry blood from your heart throughout your body. A blood pressure reading consists of a higher number over a lower number, such as 110/72. The higher number (systolic) is the pressure inside your arteries when your heart pumps. The lower number (diastolic) is the pressure inside your arteries when your heart relaxes. Ideally you want your blood pressure below 120/80. Hypertension forces your heart to work harder to pump blood. Your arteries may become narrow or stiff. Having untreated or uncontrolled hypertension can cause heart attack, stroke, kidney disease, and other problems. RISK FACTORS Some risk factors for high blood pressure are controllable. Others are not.  Risk factors you cannot control include:   Race. You may be at higher risk if you are African American.  Age. Risk increases with age.  Gender. Men are at higher risk than women before age 45 years. After age 65, women are at higher risk than men. Risk factors you can control include:  Not getting enough exercise or physical activity.  Being overweight.  Getting too much fat, sugar, calories, or salt in your diet.  Drinking too much alcohol. SIGNS AND SYMPTOMS Hypertension does not usually cause signs or symptoms. Extremely high blood pressure (hypertensive crisis) may cause headache, anxiety, shortness of breath, and nosebleed. DIAGNOSIS To check if you have hypertension, your health care provider will measure your blood pressure while you are seated, with your arm held at the level of your heart. It should be measured at least twice using the same arm. Certain conditions can cause a difference in blood pressure between your right and left arms. A blood pressure reading that is higher than normal on one occasion does not mean that you need treatment. If  it is not clear whether you have high blood pressure, you may be asked to return on a different day to have your blood pressure checked again. Or, you may be asked to monitor your blood pressure at home for 1 or more weeks. TREATMENT Treating high blood pressure includes making lifestyle changes and possibly taking medicine. Living a healthy lifestyle can help lower high blood pressure. You may need to change some of your habits. Lifestyle changes may include:  Following the DASH diet. This diet is high in fruits, vegetables, and whole grains. It is low in salt, red meat, and added sugars.  Keep your sodium intake below 2,300 mg per day.  Getting at least 30-45 minutes of aerobic exercise at least 4 times per week.  Losing weight if necessary.  Not smoking.  Limiting alcoholic beverages.  Learning ways to reduce stress. Your health care provider may prescribe medicine if lifestyle changes are not enough to get your blood pressure under control, and if one of the following is true:  You are 18-59 years of age and your systolic blood pressure is above 140.  You are 60 years of age or older, and your systolic blood pressure is above 150.  Your diastolic blood pressure is above 90.  You have diabetes, and your systolic blood pressure is over 140 or your diastolic blood pressure is over 90.  You have kidney disease and your blood pressure is above 140/90.  You have heart disease and your blood pressure is above 140/90. Your personal target blood pressure may vary depending on your medical conditions, your age, and other factors. HOME CARE INSTRUCTIONS    Have your blood pressure rechecked as directed by your health care provider.   Take medicines only as directed by your health care provider. Follow the directions carefully. Blood pressure medicines must be taken as prescribed. The medicine does not work as well when you skip doses. Skipping doses also puts you at risk for  problems.  Do not smoke.   Monitor your blood pressure at home as directed by your health care provider. SEEK MEDICAL CARE IF:   You think you are having a reaction to medicines taken.  You have recurrent headaches or feel dizzy.  You have swelling in your ankles.  You have trouble with your vision. SEEK IMMEDIATE MEDICAL CARE IF:  You develop a severe headache or confusion.  You have unusual weakness, numbness, or feel faint.  You have severe chest or abdominal pain.  You vomit repeatedly.  You have trouble breathing. MAKE SURE YOU:   Understand these instructions.  Will watch your condition.  Will get help right away if you are not doing well or get worse.   This information is not intended to replace advice given to you by your health care provider. Make sure you discuss any questions you have with your health care provider.   Document Released: 07/17/2005 Document Revised: 12/01/2014 Document Reviewed: 05/09/2013 Elsevier Interactive Patient Education 2016 [employer] Inc[employer]  

## 2015-07-23 NOTE — Progress Notes (Signed)
Patient ID: Leah [name], female    DOB: 09/22/1945  Age: 69 y.o. MRN: [id number]    Subjective:  Subjective HPI Leah [name] presents for bp f/u -- her bp has been running high.   No cp or headache but she is having sinus issues .  Her ears are popping.    Review of Systems  Constitutional: Negative for fever, chills, diaphoresis, appetite change, fatigue and unexpected weight change.  HENT: Positive for congestion, ear pain, postnasal drip and rhinorrhea. Negative for sinus pressure.   Eyes: Negative for pain, redness and visual disturbance.  Respiratory: Negative for cough, chest tightness, shortness of breath and wheezing.   Cardiovascular: Negative for chest pain, palpitations and leg swelling.  Endocrine: Negative for cold intolerance, heat intolerance, polydipsia, polyphagia and polyuria.  Genitourinary: Negative for dysuria, frequency and difficulty urinating.  Allergic/Immunologic: Negative for environmental allergies.  Neurological: Negative for dizziness, light-headedness, numbness and headaches.    History Past Medical History  Diagnosis Date  . GERD (gastroesophageal reflux disease)   . Diverticulosis   . IBS (irritable bowel syndrome)   . Anxiety   . Depression   . Barrett esophagus   . Interstitial cystitis   . Hypertension   . Hyperlipidemia   . History of adenomatous polyp of colon     tubular adenoma  . History of esophageal dilatation     for stricture  . History of chronic gastritis   . History of diverticulitis of colon   . History of esophageal spasm   . Hiatal hernia   . Complication of anesthesia     slow to wake  . Chronic bronchitis (Leah [name])     per pt last episode july 2016  . Smokers' cough (Leah [name])   . Productive cough     intermittant  . Frequency of urination   . Urgency of urination   . Bladder pain     She has past surgical history that includes Carpal tunnel release (Right, 1993); Sinus surgery with Instatrak (1991);  laparoscopic appendectomy (N/A, 09/21/2013); Anterior fusion cervical spine (09/03/2014); Anterior cervical decomp/discectomy fusion (N/A, 09/07/2014); Tonsillectomy and adenoidectomy (1964); Laparoscopic cholecystectomy (2002); Abdominal hysterectomy (1975); Temporomandibular joint surgery (1990); LAPAROSCOPY SIGMOID COLECTOMY (10-05-2008); Microlaryngoscopy (09-27-2006); Colonoscopy (last one 04-30-2013); Esophagogastroduodenoscopy (last one 09-11-2012); CYSTO/  HOD/  BLADDER BX (1990's); and cysto with hydrodistension (N/A, 06/08/2015).   Her family history includes Colon polyps in her mother; Diabetes in her father; Heart disease in her father; Hypertension in her father; Leukemia in her father; Pancreatic cancer in her father and mother; Prostate cancer in her father. There is no history of Colon cancer.She reports that she has been smoking Cigarettes.  She has a 47 pack-year smoking history. She has never used smokeless tobacco. She reports that she does not drink alcohol or use illicit drugs.  Current Outpatient Prescriptions on File Prior to Visit  Medication Sig Dispense Refill  . atorvastatin (LIPITOR) 40 MG tablet Take 1 tablet (40 mg total) by mouth daily at 6 PM. 30 tablet 2  . dicyclomine (BENTYL) 10 MG capsule Take 1 capsule by mouth three times daily as needed for cramping and spasms. 30 capsule 0  . esomeprazole (NEXIUM) 40 MG capsule Take 1 capsule (40 mg total) by mouth 2 (two) times daily before a meal. 180 capsule 3  . estradiol (VIVELLE-DOT) 0.1 MG/24HR Place 1 patch onto the skin 2 (two) times a week.      . fluticasone (FLONASE) 50 MCG/ACT nasal spray Place 2 sprays  into both nostrils daily. (Patient taking differently: Place 2 sprays into both nostrils daily as needed. ) 16 g 6  . LORazepam (ATIVAN) 0.5 MG tablet Take 0.5 mg by mouth at bedtime.   5  . sertraline (ZOLOFT) 50 MG tablet Take 50 mg by mouth at bedtime.     No current facility-administered medications on file prior to  visit.     Objective:  Objective Physical Exam  Constitutional: She is oriented to person, place, and time. She appears well-developed and well-nourished.  HENT:  Head: Normocephalic and atraumatic.  Right Ear: A middle ear effusion is present.  Left Ear: A middle ear effusion is present.  Nose: Right sinus exhibits no maxillary sinus tenderness and no frontal sinus tenderness. Left sinus exhibits no maxillary sinus tenderness and no frontal sinus tenderness.  Mouth/Throat: Posterior oropharyngeal erythema present. No posterior oropharyngeal edema.  Eyes: Conjunctivae and EOM are normal.  Neck: Normal range of motion. Neck supple. No JVD present. Carotid bruit is not present. No thyromegaly present.  Cardiovascular: Normal rate, regular rhythm and normal heart sounds.   No murmur heard. Pulmonary/Chest: Effort normal and breath sounds normal. No respiratory distress. She has no wheezes. She has no rales. She exhibits no tenderness.  Musculoskeletal: She exhibits no edema.  Neurological: She is alert and oriented to person, place, and time.  Psychiatric: She has a normal mood and affect.  Nursing note and vitals reviewed.  BP 130/84 mmHg  Pulse 76  Temp(Src) 98.4 F (36.9 C) (Oral)  Wt 119 lb 9.6 oz (54.25 kg)  SpO2 97% Wt Readings from Last 3 Encounters:  07/23/15 119 lb 9.6 oz (54.25 kg)  06/08/15 120 lb (54.432 kg)  04/27/15 121 lb 9.6 oz (55.157 kg)     Lab Results  Component Value Date   WBC 5.0 11/09/2014   HGB 13.9 06/08/2015   HCT 41.0 06/08/2015   PLT 232.0 11/09/2014   GLUCOSE 93 06/08/2015   CHOL 199 02/10/2015   TRIG 154.0* 02/10/2015   HDL 33.30* 02/10/2015   LDLDIRECT 172.0 12/20/2012   LDLCALC 135* 02/10/2015   ALT 9 02/10/2015   AST 12 02/10/2015   NA 140 06/08/2015   K 4.4 06/08/2015   CL 106 06/08/2015   CREATININE 0.80 06/08/2015   BUN 14 06/08/2015   CO2 30 11/09/2014   TSH 1.28 09/19/2011   INR 1.0 05/21/2009   HGBA1C 6.1 09/19/2011    MICROALBUR <0.7 11/09/2014    No results found.   Assessment & Plan:  Plan I have discontinued Ms. Leah [name]'s lisinopril, URIBEL, HYDROcodone-acetaminophen, and phenazopyridine. I am also having her start on lisinopril and levocetirizine. Additionally, I am having her maintain her estradiol, sertraline, dicyclomine, atorvastatin, esomeprazole, LORazepam, fluticasone, ezetimibe, and beclomethasone.  Meds ordered this encounter  Medications  . ezetimibe (ZETIA) 10 MG tablet    Sig: Take 1 tablet (10 mg total) by mouth daily.    Dispense:  90 tablet    Refill:  1  . beclomethasone (QVAR) 80 MCG/ACT inhaler    Sig: Inhale 2 puffs into the lungs 2 (two) times daily.    Dispense:  1 Inhaler    Refill:  12  . lisinopril (PRINIVIL,ZESTRIL) 10 MG tablet    Sig: Take 1 tablet (10 mg total) by mouth daily.    Dispense:  90 tablet    Refill:  3  . levocetirizine (XYZAL) 5 MG tablet    Sig: Take 1 tablet (5 mg total) by mouth every evening.  Dispense:  30 tablet    Refill:  5    Problem List Items Addressed This Visit      Unprioritized   HBP (high blood pressure)   Relevant Medications   ezetimibe (ZETIA) 10 MG tablet   lisinopril (PRINIVIL,ZESTRIL) 10 MG tablet   Other Relevant Orders   Comp Met (CMET)   Lipid panel    Other Visit Diagnoses    Hyperlipidemia    -  Primary    Relevant Medications    ezetimibe (ZETIA) 10 MG tablet    lisinopril (PRINIVIL,ZESTRIL) 10 MG tablet    Other Relevant Orders    Comp Met (CMET)    Lipid panel    COPD bronchitis        Relevant Medications    beclomethasone (QVAR) 80 MCG/ACT inhaler    levocetirizine (XYZAL) 5 MG tablet    Need for hepatitis C screening test        Relevant Orders    Hepatitis C antibody    Bilateral serous otitis media, recurrence not specified, unspecified chronicity        Relevant Medications    levocetirizine (XYZAL) 5 MG tablet       Follow-up: Return in about 3 months (around 10/21/2015), or if symptoms  worsen or fail to improve, for hypertension, hyperlipidemia.  Garnet Koyanagi, DO

## 2015-07-23 NOTE — Progress Notes (Signed)
Pre visit review using our clinic review tool, if applicable. No additional management support is needed unless otherwise documented below in the visit note. 

## 2015-07-24 LAB — HEPATITIS C ANTIBODY: HCV Ab: NEGATIVE

## 2015-07-28 ENCOUNTER — Other Ambulatory Visit: Payer: Self-pay

## 2015-07-28 DIAGNOSIS — E785 Hyperlipidemia, unspecified: Secondary | ICD-10-CM

## 2015-07-28 MED ORDER — ATORVASTATIN CALCIUM 40 MG PO TABS: 40.0000 mg | ORAL_TABLET | Freq: Every day | ORAL | Status: DC

## 2015-07-29 ENCOUNTER — Ambulatory Visit: Payer: BLUE CROSS/BLUE SHIELD | Admitting: Family Medicine

## 2015-09-22 ENCOUNTER — Other Ambulatory Visit (HOSPITAL_COMMUNITY): Payer: Self-pay | Admitting: Psychiatry

## 2015-12-07 ENCOUNTER — Encounter: Payer: Self-pay | Admitting: Family Medicine

## 2015-12-07 ENCOUNTER — Ambulatory Visit (INDEPENDENT_AMBULATORY_CARE_PROVIDER_SITE_OTHER): Payer: BLUE CROSS/BLUE SHIELD | Admitting: Family Medicine

## 2015-12-07 VITALS — BP 140/80 | HR 81 | Temp 98.3°F | Wt 115.4 lb

## 2015-12-07 DIAGNOSIS — J441 Chronic obstructive pulmonary disease with (acute) exacerbation: Secondary | ICD-10-CM

## 2015-12-07 DIAGNOSIS — R062 Wheezing: Secondary | ICD-10-CM

## 2015-12-07 MED ORDER — IPRATROPIUM-ALBUTEROL 0.5-2.5 (3) MG/3ML IN SOLN
3.0000 mL | Freq: Once | RESPIRATORY_TRACT | Status: AC
  Administered 2015-12-07: 3 mL via RESPIRATORY_TRACT

## 2015-12-07 MED ORDER — CLARITHROMYCIN ER 500 MG PO TB24: 1000.0000 mg | ORAL_TABLET | Freq: Every day | ORAL | Status: DC

## 2015-12-07 MED ORDER — METHYLPREDNISOLONE ACETATE 80 MG/ML IJ SUSP
80.0000 mg | Freq: Once | INTRAMUSCULAR | Status: AC
  Administered 2015-12-07: 80 mg via INTRAMUSCULAR

## 2015-12-07 MED ORDER — BUDESONIDE-FORMOTEROL FUMARATE 80-4.5 MCG/ACT IN AERO: 2.0000 | INHALATION_SPRAY | Freq: Two times a day (BID) | RESPIRATORY_TRACT | Status: DC

## 2015-12-07 MED ORDER — TIOTROPIUM BROMIDE MONOHYDRATE 18 MCG IN CAPS: 18.0000 ug | ORAL_CAPSULE | Freq: Every day | RESPIRATORY_TRACT | Status: DC

## 2015-12-07 NOTE — Progress Notes (Signed)
Subjective:    Patient ID: Leah [name], female    DOB: October 08, 1945, 70 y.o.   MRN: [id number]  HPI  Patient here for f/u from UC for bronchitis.  She went to urgent care last wed----symptoms started Friday before.   uc gave her doxy and steroid shot.  Pt states she is just not getting better.     Past Medical History  Diagnosis Date  . GERD (gastroesophageal reflux disease)   . Diverticulosis   . IBS (irritable bowel syndrome)   . Anxiety   . Depression   . Barrett esophagus   . Interstitial cystitis   . Hypertension   . Hyperlipidemia   . History of adenomatous polyp of colon     tubular adenoma  . History of esophageal dilatation     for stricture  . History of chronic gastritis   . History of diverticulitis of colon   . History of esophageal spasm   . Hiatal hernia   . Complication of anesthesia     slow to wake  . Chronic bronchitis ([place])     per pt last episode july 2016  . Smokers' cough ([place])   . Productive cough     intermittant  . Frequency of urination   . Urgency of urination   . Bladder pain     Review of Systems  Constitutional: Positive for fever. Negative for chills.  HENT: Positive for congestion, rhinorrhea and sinus pressure. Negative for postnasal drip.   Respiratory: Positive for cough, chest tightness, shortness of breath and wheezing.   Cardiovascular: Negative for chest pain, palpitations and leg swelling.  Allergic/Immunologic: Negative for environmental allergies.       Objective:    Physical Exam  Constitutional: She is oriented to person, place, and time. She appears well-developed and well-nourished.  HENT:  Head: Normocephalic and atraumatic.  Right Ear: External ear normal.  Left Ear: External ear normal.  + PND + errythema  Eyes: Conjunctivae and EOM are normal. Right eye exhibits no discharge. Left eye exhibits no discharge.  Neck: Normal range of motion. Neck supple. No JVD present. Carotid bruit is not present. No  thyromegaly present.  Cardiovascular: Normal rate, regular rhythm and normal heart sounds.   No murmur heard. Pulmonary/Chest: Effort normal. No respiratory distress. She has wheezes. She has no rales. She exhibits no tenderness.  Musculoskeletal: She exhibits no edema.  Lymphadenopathy:    She has cervical adenopathy.  Neurological: She is alert and oriented to person, place, and time.  Psychiatric: She has a normal mood and affect. Her behavior is normal. Judgment and thought content normal.  Nursing note and vitals reviewed.   BP 140/80 mmHg  Pulse 81  Temp(Src) 98.3 F (36.8 C) (Oral)  Wt 115 lb 6.4 oz (52.345 kg)  SpO2 97% Wt Readings from Last 3 Encounters:  12/07/15 115 lb 6.4 oz (52.345 kg)  07/23/15 119 lb 9.6 oz (54.25 kg)  06/08/15 120 lb (54.432 kg)     Lab Results  Component Value Date   WBC 5.0 11/09/2014   HGB 13.9 06/08/2015   HCT 41.0 06/08/2015   PLT 232.0 11/09/2014   GLUCOSE 93 07/23/2015   CHOL 279* 07/23/2015   TRIG 172* 07/23/2015   HDL 48 07/23/2015   LDLDIRECT 172.0 12/20/2012   LDLCALC 197* 07/23/2015   ALT 11 07/23/2015   AST 13 07/23/2015   NA 141 07/23/2015   K 3.6 07/23/2015   CL 105 07/23/2015   CREATININE 0.86 07/23/2015  BUN 15 07/23/2015   CO2 23 07/23/2015   TSH 1.28 09/19/2011   INR 1.0 05/21/2009   HGBA1C 6.1 09/19/2011   MICROALBUR <0.7 11/09/2014    No results found.     Assessment & Plan:   Problem List Items Addressed This Visit      Unprioritized   COPD exacerbation ([place]) - Primary    Add spiriva symbicort--- d/c qvar Depo medrol abx per orders Consider pulmonary      Relevant Medications   albuterol (PROVENTIL) (2.5 MG/3ML) 0.083% nebulizer solution   ipratropium-albuterol (DUONEB) 0.5-2.5 (3) MG/3ML nebulizer solution 3 mL (Completed)   tiotropium (SPIRIVA HANDIHALER) 18 MCG inhalation capsule   clarithromycin (BIAXIN XL) 500 MG 24 hr tablet   budesonide-formoterol (SYMBICORT) 80-4.5 MCG/ACT inhaler     methylPREDNISolone acetate (DEPO-MEDROL) injection 80 mg (Completed)    Other Visit Diagnoses    Wheezing        Relevant Medications    ipratropium-albuterol (DUONEB) 0.5-2.5 (3) MG/3ML nebulizer solution 3 mL (Completed)    methylPREDNISolone acetate (DEPO-MEDROL) injection 80 mg (Completed)        [name], DO

## 2015-12-07 NOTE — Assessment & Plan Note (Signed)
Add spiriva symbicort--- d/c qvar Depo medrol abx per orders Consider pulmonary

## 2015-12-07 NOTE — Patient Instructions (Addendum)
Stop Lipitor while taking biaxin   Smoking Cessation, Tips for Success If you are ready to quit smoking, congratulations! You have chosen to help yourself be healthier. Cigarettes bring nicotine, tar, carbon monoxide, and other irritants into your body. Your lungs, heart, and blood vessels will be able to work better without these poisons. There are many different ways to quit smoking. Nicotine gum, nicotine patches, a nicotine inhaler, or nicotine nasal spray can help with physical craving. Hypnosis, support groups, and medicines help break the habit of smoking. WHAT THINGS CAN I DO TO MAKE QUITTING EASIER?  Here are some tips to help you quit for good:  Pick a date when you will quit smoking completely. Tell all of your friends and family about your plan to quit on that date.  Do not try to slowly cut down on the number of cigarettes you are smoking. Pick a quit date and quit smoking completely starting on that day.  Throw away all cigarettes.   Clean and remove all ashtrays from your home, work, and car.  On a card, write down your reasons for quitting. Carry the card with you and read it when you get the urge to smoke.  Cleanse your body of nicotine. Drink enough water and fluids to keep your urine clear or pale yellow. Do this after quitting to flush the nicotine from your body.  Learn to predict your moods. Do not let a bad situation be your excuse to have a cigarette. Some situations in your life might tempt you into wanting a cigarette.  Never have "just one" cigarette. It leads to wanting another and another. Remind yourself of your decision to quit.  Change habits associated with smoking. If you smoked while driving or when feeling stressed, try other activities to replace smoking. Stand up when drinking your coffee. Brush your teeth after eating. Sit in a different chair when you read the paper. Avoid alcohol while trying to quit, and try to drink fewer caffeinated beverages.  Alcohol and caffeine may urge you to smoke.  Avoid foods and drinks that can trigger a desire to smoke, such as sugary or spicy foods and alcohol.  Ask people who smoke not to smoke around you.  Have something planned to do right after eating or having a cup of coffee. For example, plan to take a walk or exercise.  Try a relaxation exercise to calm you down and decrease your stress. Remember, you may be tense and nervous for the first 2 weeks after you quit, but this will pass.  Find new activities to keep your hands busy. Play with a pen, coin, or rubber band. Doodle or draw things on paper.  Brush your teeth right after eating. This will help cut down on the craving for the taste of tobacco after meals. You can also try mouthwash.   Use oral substitutes in place of cigarettes. Try using lemon drops, carrots, cinnamon sticks, or chewing gum. Keep them handy so they are available when you have the urge to smoke.  When you have the urge to smoke, try deep breathing.  Designate your home as a nonsmoking area.  If you are a heavy smoker, ask your health care provider about a prescription for nicotine chewing gum. It can ease your withdrawal from nicotine.  Reward yourself. Set aside the cigarette money you save and buy yourself something nice.  Look for support from others. Join a support group or smoking cessation program. Ask someone at home or at  work to help you with your plan to quit smoking.  Always ask yourself, "Do I need this cigarette or is this just a reflex?" Tell yourself, "Today, I choose not to smoke," or "I do not want to smoke." You are reminding yourself of your decision to quit.  Do not replace cigarette smoking with electronic cigarettes (commonly called e-cigarettes). The safety of e-cigarettes is unknown, and some may contain harmful chemicals.  If you relapse, do not give up! Plan ahead and think about what you will do the next time you get the urge to smoke. HOW  WILL I FEEL WHEN I QUIT SMOKING? You may have symptoms of withdrawal because your body is used to nicotine (the addictive substance in cigarettes). You may crave cigarettes, be irritable, feel very hungry, cough often, get headaches, or have difficulty concentrating. The withdrawal symptoms are only temporary. They are strongest when you first quit but will go away within 10-14 days. When withdrawal symptoms occur, stay in control. Think about your reasons for quitting. Remind yourself that these are signs that your body is healing and getting used to being without cigarettes. Remember that withdrawal symptoms are easier to treat than the major diseases that smoking can cause.  Even after the withdrawal is over, expect periodic urges to smoke. However, these cravings are generally short lived and will go away whether you smoke or not. Do not smoke! WHAT RESOURCES ARE AVAILABLE TO HELP ME QUIT SMOKING? Your health care provider can direct you to community resources or hospitals for support, which may include:  Group support.  Education.  Hypnosis.  Therapy.   This information is not intended to replace advice given to you by your health care provider. Make sure you discuss any questions you have with your health care provider.   Document Released: 04/14/2004 Document Revised: 08/07/2014 Document Reviewed: 01/02/2013 Elsevier Interactive Patient Education [employer].

## 2015-12-07 NOTE — Progress Notes (Signed)
Pre visit review using our clinic review tool, if applicable. No additional management support is needed unless otherwise documented below in the visit note. 

## 2015-12-08 ENCOUNTER — Other Ambulatory Visit: Payer: Self-pay | Admitting: *Deleted

## 2015-12-08 NOTE — Telephone Encounter (Signed)
Pt called in stating that she is not feeling any better since her appointment yesterday and would like to extend her time out of work. Symptoms are unchanged from OV yesterday. She would like a note to return to work on 12/13/15. Confirmed w/ pt that she has been taking new medication as prescribed. She is taking biaxin and Symbicort, but she did not receive Spiriva inhaler yesterday. Advised pt to pick up this inhaler and start taking as directed. Advised pt that may take more than 24 hours before her newly prescribed rxs reach full efficacy. Advised to follow up in office if symptoms worsen or fail to improve. Please advise work note.

## 2015-12-08 NOTE — Telephone Encounter (Signed)
Please advise 

## 2015-12-08 NOTE — Telephone Encounter (Signed)
Pt says that she called and spoke with pharmacy they informed her that they didn't mention the Rx for Symbicort because her insurance wont cover it. Pt says that they informed her that the Rx would cost her 300.00 out of pocket.   Pt would like to know if there is something else that she could take?      CB: [id number]

## 2015-12-09 NOTE — Telephone Encounter (Signed)
Letter printed and filed at front desk for pickup. 

## 2015-12-09 NOTE — Telephone Encounter (Signed)
We need to know what they will pay for-- if she has a formulary at home that will help [place] choose

## 2015-12-09 NOTE — Telephone Encounter (Signed)
Ok to give note 

## 2015-12-09 NOTE — Telephone Encounter (Signed)
Patient calling insurance to get alternatives will call back once that is done. She also will be in to pick up the note

## 2015-12-09 NOTE — Telephone Encounter (Signed)
Called patient to verify if this medication would work for her since there has was a contraindication allergy listed left message for patient to return call.

## 2015-12-09 NOTE — Addendum Note (Signed)
Addended by: Ricky Ala on: 12/09/2015 05:21 PM   Modules accepted: Orders

## 2015-12-09 NOTE — Telephone Encounter (Signed)
Will contact pt regarding alternative formulary. Please advise request for work note for return date 12/13/15.

## 2015-12-09 NOTE — Telephone Encounter (Deleted)
Called patient to verify if this medication would work for her since there has was a contraindication allergy listed left message for patient to return call.

## 2015-12-09 NOTE — Telephone Encounter (Signed)
Pt called insurance. She says that they will cover Advair and also Dulera.

## 2015-12-09 NOTE — Telephone Encounter (Signed)
dulera 100  2 puffs bid

## 2015-12-09 NOTE — Telephone Encounter (Deleted)
Order has been placed for dulera 100 with 2 rf

## 2015-12-10 NOTE — Addendum Note (Signed)
Addended by: Dorrene German on: 12/10/2015 08:26 AM   Modules accepted: Orders

## 2015-12-14 ENCOUNTER — Encounter: Payer: Self-pay | Admitting: Family Medicine

## 2015-12-14 ENCOUNTER — Ambulatory Visit (INDEPENDENT_AMBULATORY_CARE_PROVIDER_SITE_OTHER): Payer: BLUE CROSS/BLUE SHIELD | Admitting: Family Medicine

## 2015-12-14 VITALS — BP 120/64 | HR 70 | Temp 98.3°F | Ht 61.5 in | Wt 113.6 lb

## 2015-12-14 DIAGNOSIS — J441 Chronic obstructive pulmonary disease with (acute) exacerbation: Secondary | ICD-10-CM

## 2015-12-14 DIAGNOSIS — A499 Bacterial infection, unspecified: Secondary | ICD-10-CM

## 2015-12-14 DIAGNOSIS — J329 Chronic sinusitis, unspecified: Secondary | ICD-10-CM | POA: Diagnosis not present

## 2015-12-14 DIAGNOSIS — B9689 Other specified bacterial agents as the cause of diseases classified elsewhere: Secondary | ICD-10-CM

## 2015-12-14 MED ORDER — LEVOFLOXACIN 500 MG PO TABS: 500.0000 mg | ORAL_TABLET | Freq: Every day | ORAL | Status: DC

## 2015-12-14 MED ORDER — MOMETASONE FURO-FORMOTEROL FUM 200-5 MCG/ACT IN AERO: INHALATION_SPRAY | RESPIRATORY_TRACT | Status: DC

## 2015-12-14 NOTE — Patient Instructions (Signed)
Chronic Obstructive Pulmonary Disease Chronic obstructive pulmonary disease (COPD) is a common lung condition in which airflow from the lungs is limited. COPD is a general term that can be used to describe many different lung problems that limit airflow, including both chronic bronchitis and emphysema. If you have COPD, your lung function will probably never return to normal, but there are measures you can take to improve lung function and make yourself feel better. CAUSES   Smoking (common).  Exposure to secondhand smoke.  Genetic problems.  Chronic inflammatory lung diseases or recurrent infections. SYMPTOMS  Shortness of breath, especially with physical activity.  Deep, persistent (chronic) cough with a large amount of thick mucus.  Wheezing.  Rapid breaths (tachypnea).  Gray or bluish discoloration (cyanosis) of the skin, especially in your fingers, toes, or lips.  Fatigue.  Weight loss.  Frequent infections or episodes when breathing symptoms become much worse (exacerbations).  Chest tightness. DIAGNOSIS Your health care provider will take a medical history and perform a physical examination to diagnose COPD. Additional tests for COPD may include:  Lung (pulmonary) function tests.  Chest X-ray.  CT scan.  Blood tests. TREATMENT  Treatment for COPD may include:  Inhaler and nebulizer medicines. These help manage the symptoms of COPD and make your breathing more comfortable.  Supplemental oxygen. Supplemental oxygen is only helpful if you have a low oxygen level in your blood.  Exercise and physical activity. These are beneficial for nearly all people with COPD.  Lung surgery or transplant.  Nutrition therapy to gain weight, if you are underweight.  Pulmonary rehabilitation. This may involve working with a team of health care providers and specialists, such as respiratory, occupational, and physical therapists. HOME CARE INSTRUCTIONS  Take all medicines  (inhaled or pills) as directed by your health care provider.  Avoid over-the-counter medicines or cough syrups that dry up your airway (such as antihistamines) and slow down the elimination of secretions unless instructed otherwise by your health care provider.  If you are a smoker, the most important thing that you can do is stop smoking. Continuing to smoke will cause further lung damage and breathing trouble. Ask your health care provider for help with quitting smoking. He or she can direct you to community resources or hospitals that provide support.  Avoid exposure to irritants such as smoke, chemicals, and fumes that aggravate your breathing.  Use oxygen therapy and pulmonary rehabilitation if directed by your health care provider. If you require home oxygen therapy, ask your health care provider whether you should purchase a pulse oximeter to measure your oxygen level at home.  Avoid contact with individuals who have a contagious illness.  Avoid extreme temperature and humidity changes.  Eat healthy foods. Eating smaller, more frequent meals and resting before meals may help you maintain your strength.  Stay active, but balance activity with periods of rest. Exercise and physical activity will help you maintain your ability to do things you want to do.  Preventing infection and hospitalization is very important when you have COPD. Make sure to receive all the vaccines your health care provider recommends, especially the pneumococcal and influenza vaccines. Ask your health care provider whether you need a pneumonia vaccine.  Learn and use relaxation techniques to manage stress.  Learn and use controlled breathing techniques as directed by your health care provider. Controlled breathing techniques include:  Pursed lip breathing. Start by breathing in (inhaling) through your nose for 1 second. Then, purse your lips as if you were   going to whistle and breathe out (exhale) through the  pursed lips for 2 seconds.  Diaphragmatic breathing. Start by putting one hand on your abdomen just above your waist. Inhale slowly through your nose. The hand on your abdomen should move out. Then purse your lips and exhale slowly. You should be able to feel the hand on your abdomen moving in as you exhale.  Learn and use controlled coughing to clear mucus from your lungs. Controlled coughing is a series of short, progressive coughs. The steps of controlled coughing are: 1. Lean your head slightly forward. 2. Breathe in deeply using diaphragmatic breathing. 3. Try to hold your breath for 3 seconds. 4. Keep your mouth slightly open while coughing twice. 5. Spit any mucus out into a tissue. 6. Rest and repeat the steps once or twice as needed. SEEK MEDICAL CARE IF:  You are coughing up more mucus than usual.  There is a change in the color or thickness of your mucus.  Your breathing is more labored than usual.  Your breathing is faster than usual. SEEK IMMEDIATE MEDICAL CARE IF:  You have shortness of breath while you are resting.  You have shortness of breath that prevents you from:  Being able to talk.  Performing your usual physical activities.  You have chest pain lasting longer than 5 minutes.  Your skin color is more cyanotic than usual.  You measure low oxygen saturations for longer than 5 minutes with a pulse oximeter. MAKE SURE YOU:  Understand these instructions.  Will watch your condition.  Will get help right away if you are not doing well or get worse.   This information is not intended to replace advice given to you by your health care provider. Make sure you discuss any questions you have with your health care provider.   Document Released: 04/26/2005 Document Revised: 08/07/2014 Document Reviewed: 03/13/2013 Elsevier Interactive Patient Education 2016 Elsevier Inc.  

## 2015-12-14 NOTE — Progress Notes (Signed)
Pre visit review using our clinic review tool, if applicable. No additional management support is needed unless otherwise documented below in the visit note. 

## 2015-12-14 NOTE — Progress Notes (Signed)
Patient ID: Leah [name], female    DOB: [birth year]  Age: 70 y.o. MRN: [id number]    Subjective:  Subjective HPI Leah [name] presents for f/u bronchitis ,  exac copd.  She is now c/o sinus symptoms.  + pressure, no fevers.     Review of Systems  Constitutional: Negative for fever and chills.  HENT: Positive for congestion, postnasal drip, rhinorrhea and sinus pressure.   Respiratory: Positive for cough, chest tightness, shortness of breath and wheezing.   Cardiovascular: Negative for chest pain, palpitations and leg swelling.  Allergic/Immunologic: Negative for environmental allergies.    History Past Medical History  Diagnosis Date  . GERD (gastroesophageal reflux disease)   . Diverticulosis   . IBS (irritable bowel syndrome)   . Anxiety   . Depression   . Barrett esophagus   . Interstitial cystitis   . Hypertension   . Hyperlipidemia   . History of adenomatous polyp of colon     tubular adenoma  . History of esophageal dilatation     for stricture  . History of chronic gastritis   . History of diverticulitis of colon   . History of esophageal spasm   . Hiatal hernia   . Complication of anesthesia     slow to wake  . Chronic bronchitis ([place])     per pt last episode july 2016  . Smokers' cough ([place])   . Productive cough     intermittant  . Frequency of urination   . Urgency of urination   . Bladder pain     She has past surgical history that includes Carpal tunnel release (Right, 1993); Sinus surgery with Instatrak (1991); laparoscopic appendectomy (N/A, 09/21/2013); Anterior fusion cervical spine (09/03/2014); Anterior cervical decomp/discectomy fusion (N/A, 09/07/2014); Tonsillectomy and adenoidectomy (1964); Laparoscopic cholecystectomy (2002); Abdominal hysterectomy (1975); Temporomandibular joint surgery (1990); LAPAROSCOPY SIGMOID COLECTOMY (10-05-2008); Microlaryngoscopy (09-27-2006); Colonoscopy (last one 04-30-2013); Esophagogastroduodenoscopy (last  one 09-11-2012); CYSTO/  HOD/  BLADDER BX (1990's); and cysto with hydrodistension (N/A, 06/08/2015).   Her family history includes Colon polyps in her mother; Diabetes in her father; Heart disease in her father; Hypertension in her father; Leukemia in her father; Pancreatic cancer in her father and mother; Prostate cancer in her father. There is no history of Colon cancer.She reports that she has been smoking Cigarettes.  She has a 47 pack-year smoking history. She has never used smokeless tobacco. She reports that she does not drink alcohol or use illicit drugs.  Current Outpatient Prescriptions on File Prior to Visit  Medication Sig Dispense Refill  . albuterol (PROVENTIL) (2.5 MG/3ML) 0.083% nebulizer solution Inhale 3 mg into the lungs. q 4-6 hours as needed  0  . atorvastatin (LIPITOR) 40 MG tablet Take 1 tablet (40 mg total) by mouth daily at 6 PM. 30 tablet 2  . budesonide-formoterol (SYMBICORT) 80-4.5 MCG/ACT inhaler Inhale 2 puffs into the lungs 2 (two) times daily. 1 Inhaler 3  . dicyclomine (BENTYL) 10 MG capsule Take 1 capsule by mouth three times daily as needed for cramping and spasms. 30 capsule 0  . esomeprazole (NEXIUM) 40 MG capsule Take 1 capsule (40 mg total) by mouth 2 (two) times daily before a meal. 180 capsule 3  . estradiol (VIVELLE-DOT) 0.1 MG/24HR Place 1 patch onto the skin 2 (two) times a week.      . ezetimibe (ZETIA) 10 MG tablet Take 1 tablet (10 mg total) by mouth daily. 90 tablet 1  . fluticasone (FLONASE) 50 MCG/ACT nasal spray  Place 2 sprays into both nostrils daily. (Patient taking differently: Place 2 sprays into both nostrils daily as needed. ) 16 g 6  . levocetirizine (XYZAL) 5 MG tablet Take 1 tablet (5 mg total) by mouth every evening. 30 tablet 5  . lisinopril (PRINIVIL,ZESTRIL) 10 MG tablet Take 1 tablet (10 mg total) by mouth daily. 90 tablet 3  . LORazepam (ATIVAN) 0.5 MG tablet Take 0.5 mg by mouth at bedtime.   5  . sertraline (ZOLOFT) 50 MG tablet  Take 50 mg by mouth at bedtime.    Marland Kitchen tiotropium (SPIRIVA HANDIHALER) 18 MCG inhalation capsule Place 1 capsule (18 mcg total) into inhaler and inhale daily. 30 capsule 12   No current facility-administered medications on file prior to visit.     Objective:  Objective Physical Exam  Constitutional: She is oriented to person, place, and time. She appears well-developed and well-nourished.  HENT:  Right Ear: External ear normal.  Left Ear: External ear normal.  + PND + errythema  Eyes: Conjunctivae are normal. Right eye exhibits no discharge. Left eye exhibits no discharge.  Cardiovascular: Normal rate, regular rhythm and normal heart sounds.   No murmur heard. Pulmonary/Chest: Effort normal. No respiratory distress. She has wheezes. She has no rales. She exhibits no tenderness.  Musculoskeletal: She exhibits no edema.  Lymphadenopathy:    She has cervical adenopathy.  Neurological: She is alert and oriented to person, place, and time.   BP 120/64 mmHg  Pulse 70  Temp(Src) 98.3 F (36.8 C) (Oral)  Ht 5' 1.5" (1.562 m)  Wt 113 lb 9.6 oz (51.529 kg)  BMI 21.12 kg/m2  SpO2 96% Wt Readings from Last 3 Encounters:  12/14/15 113 lb 9.6 oz (51.529 kg)  12/07/15 115 lb 6.4 oz (52.345 kg)  07/23/15 119 lb 9.6 oz (54.25 kg)     Lab Results  Component Value Date   WBC 5.0 11/09/2014   HGB 13.9 06/08/2015   HCT 41.0 06/08/2015   PLT 232.0 11/09/2014   GLUCOSE 93 07/23/2015   CHOL 279* 07/23/2015   TRIG 172* 07/23/2015   HDL 48 07/23/2015   LDLDIRECT 172.0 12/20/2012   LDLCALC 197* 07/23/2015   ALT 11 07/23/2015   AST 13 07/23/2015   NA 141 07/23/2015   K 3.6 07/23/2015   CL 105 07/23/2015   CREATININE 0.86 07/23/2015   BUN 15 07/23/2015   CO2 23 07/23/2015   TSH 1.28 09/19/2011   INR 1.0 05/21/2009   HGBA1C 6.1 09/19/2011   MICROALBUR <0.7 11/09/2014    No results found.   Assessment & Plan:  Plan I have discontinued Leah [name]'s clarithromycin. I am also  having her start on mometasone-formoterol and levofloxacin. Additionally, I am having her maintain her estradiol, sertraline, dicyclomine, esomeprazole, LORazepam, fluticasone, ezetimibe, lisinopril, levocetirizine, atorvastatin, albuterol, tiotropium, and budesonide-formoterol.  Meds ordered this encounter  Medications  . mometasone-formoterol (DULERA) 200-5 MCG/ACT AERO    Sig: 2 puffs bid    Dispense:  1 Inhaler    Refill:  2  . levofloxacin (LEVAQUIN) 500 MG tablet    Sig: Take 1 tablet (500 mg total) by mouth daily.    Dispense:  7 tablet    Refill:  0    Problem List Items Addressed This Visit      Unprioritized   COPD exacerbation ([place]) - Primary   Relevant Medications   mometasone-formoterol (DULERA) 200-5 MCG/ACT AERO    Other Visit Diagnoses    Sinusitis, bacterial  Relevant Medications    levofloxacin (LEVAQUIN) 500 MG tablet     con't inhalers--- dulera sent in to replace symbicort con't flonase rto prn  Quit smoking!!!  Follow-up: Return if symptoms worsen or fail to improve.  [name], DO

## 2015-12-20 ENCOUNTER — Telehealth: Payer: Self-pay

## 2015-12-20 NOTE — Telephone Encounter (Signed)
Received [employer] disability paperwork via fax from [facility]  Called patient to verify reason for disability.  Left a message for call back.

## 2015-12-23 NOTE — Telephone Encounter (Signed)
Called and left message for pt to please call back to verify reason for disability. JG//CMA

## 2015-12-24 NOTE — Telephone Encounter (Signed)
Pt returned phone call. She was out of work for a continuous period from 12/07/15-12/20/15 for COPD Exacerbation and wheezing. Forms filled out as much as possible and forwarded to Dr. Carollee Herter. JG//CMA

## 2015-12-30 NOTE — Telephone Encounter (Signed)
Original mailed to pt. JG//CMA

## 2015-12-30 NOTE — Telephone Encounter (Signed)
Completed forms faxed to [facility] successfully at [phone]. Sent for scanning. JG//CMA

## 2016-01-13 ENCOUNTER — Encounter: Payer: Self-pay | Admitting: Family Medicine

## 2016-01-13 ENCOUNTER — Ambulatory Visit (INDEPENDENT_AMBULATORY_CARE_PROVIDER_SITE_OTHER): Payer: BLUE CROSS/BLUE SHIELD | Admitting: Family Medicine

## 2016-01-13 VITALS — BP 134/64 | HR 86 | Temp 98.2°F | Ht 62.0 in | Wt 117.2 lb

## 2016-01-13 DIAGNOSIS — R1032 Left lower quadrant pain: Secondary | ICD-10-CM

## 2016-01-13 DIAGNOSIS — K5732 Diverticulitis of large intestine without perforation or abscess without bleeding: Secondary | ICD-10-CM

## 2016-01-13 MED ORDER — METRONIDAZOLE 500 MG PO TABS: 500.0000 mg | ORAL_TABLET | Freq: Three times a day (TID) | ORAL | Status: DC

## 2016-01-13 NOTE — Patient Instructions (Signed)
I think that you are having a flare of your colon infection (diverticulitis). Take the flagyl antibiotic as directed.  Please eat a soft, easy to digest diet and drink plenty of fluids If you are nor feeling better in the next 1-2 days please let us know Seek care right away if you have worsening pain, fever, vomiting, or other concerns

## 2016-01-13 NOTE — Progress Notes (Signed)
[place] at [facility] [address] [place], [place] [address] [phone]  Date:  01/13/2016   Name:  Leah [name]   DOB:  Jan 19, 1946   MRN:  [id number]  PCP:  [name], DO    Chief Complaint: Colon Spasms   History of Present Illness:  Leah [name] is a 70 y.o. very pleasant female patient who presents with the following:  She has noted "spasms in my colon."  She notes intermittent pains in her LLQ. She was a pt of Dr. Olevia Perches but she is now retired. She is waiting for a follow-up appt with her new GI doc but it is not until July She has noted the "spasms" for about one week.  Partial HPI from GI note from about 2 years ago: She is frequently seen for chronic/recurrent complaints of left lower quadrant abdominal pain. She does have a history of sigmoid resection for recurrent diverticulitis in March 2010, and she's had radiographic findings on CT scan consistent with diverticulitis since that time. She also has history of small anastomotic ulcers. Her last appointment here was in September 2014. At that time she was scheduled for colonoscopy, which she underwent in October 2014. She had one sessile polyp in the descending colon that was a tubular adenoma on pathology. She also had mild diverticulosis with findings consistent with previous colonic anastomosis in the sigmoid colon.  She is not vomiting, but she did have a couple of days of diarrhea No blood in her stool She is eating but not as well as usual She has not noted a fever No urinary sx S/p hysterectomy, appendectomy 08/2013  She has many medication allergies but is able to tolerate flagyl - she is allergic to cipro, moxifloxacin, penicillins and sulfa   Patient Active Problem List   Diagnosis Date Noted  . Frozen shoulder 04/27/2015  . Hematoma 09/07/2014  . Cervical pain (neck) 05/19/2014  . Fatigue 05/08/2014  . Back pain 05/08/2014  . Pain  in joint, upper arm 05/08/2014  . LLQ abdominal pain 03/02/2014  . History of diverticulitis 03/02/2014  . COPD exacerbation ([place]) 01/28/2014  . Oral thrush 01/28/2014  . Influenza 10/27/2013  . Appendicitis 09/19/2013  . Abdominal pain, left lower quadrant 03/05/2013  . HBP (high blood pressure) 02/07/2013  . Sinusitis, acute 01/15/2013  . COPD GOLD II 01/15/2013  . Smoker 12/20/2012  . Epigastric abdominal pain 09/03/2012  . Left lower quadrant pain 01/24/2011  . KNEE PAIN 06/07/2009  . ROSACEA 04/16/2009  . B12 DEFICIENCY 04/09/2009  . ANEMIA, IRON DEFICIENCY 03/19/2009  . MALAISE AND FATIGUE 03/19/2009  . NECK PAIN, LEFT 01/11/2009  . URI 07/30/2008  . CONSTIPATION 05/26/2008  . NAUSEA AND VOMITING 05/26/2008  . MUSCLE PAIN 04/27/2008  . VOMITING 04/17/2008  . Dysphagia 04/17/2008  . ANXIETY DEPRESSION 03/12/2008  . TREMOR 03/12/2008  . HYPERLIPIDEMIA 11/13/2007  . DEPRESSION 11/13/2007  . ESOPHAGEAL SPASM 11/13/2007  . GERD 11/13/2007  . IBS 11/13/2007  . CARPAL TUNNEL SYNDROME, HX OF 11/13/2007  . Diverticulosis of colon (without mention of hemorrhage) 03/14/2007  . GASTRITIS, CHRONIC 04/10/2002  . HIATAL HERNIA 04/10/2002    Past Medical History  Diagnosis Date  . GERD (gastroesophageal reflux disease)   . Diverticulosis   . IBS (irritable bowel syndrome)   . Anxiety   . Depression   . Barrett esophagus   . Interstitial cystitis   . Hypertension   .  Hyperlipidemia   . History of adenomatous polyp of colon     tubular adenoma  . History of esophageal dilatation     for stricture  . History of chronic gastritis   . History of diverticulitis of colon   . History of esophageal spasm   . Hiatal hernia   . Complication of anesthesia     slow to wake  . Chronic bronchitis ([place])     per pt last episode july 2016  . Smokers' cough ([place])   . Productive cough     intermittant  . Frequency of urination   . Urgency of urination   . Bladder pain     Past  Surgical History  Procedure Laterality Date  . Carpal tunnel release Right 1993  . Sinus surgery with instatrak  1991  . Laparoscopic appendectomy N/A 09/21/2013    Procedure: APPENDECTOMY LAPAROSCOPIC;  Surgeon: Rolm Bookbinder, MD;  Location: [facility];  Service: General;  Laterality: N/A;  . Anterior fusion cervical spine  09/03/2014    C5 -- C7  . Anterior cervical decomp/discectomy fusion N/A 09/07/2014    Procedure: Exploration of Fusion and Removal of Anterior Cervical Hematoma;  Surgeon: Newman Pies, MD;  Location: [facility] NEURO ORS;  Service: Neurosurgery;  Laterality: N/A;  . Tonsillectomy and adenoidectomy  1964  . Laparoscopic cholecystectomy  2002  . Abdominal hysterectomy  1975  . Temporomandibular joint surgery  1990  . Laparoscopy sigmoid colectomy  10-05-2008    diverticulitis  . Microlaryngoscopy  09-27-2006    w/ true vocal cord stripping  and bilateral bx's of lesion's (benign)  . Colonoscopy  last one 04-30-2013  . Esophagogastroduodenoscopy  last one 09-11-2012  . Cysto/  hod/  bladder bx  1990's  . Cysto with hydrodistension N/A 06/08/2015    Procedure: CYSTOSCOPY/HYDRODISTENSION INSTILLATION OF MARCAINE AND PYRIDIUM;  Surgeon: Irine Seal, MD;  Location: [facility];  Service: Urology;  Laterality: N/A;    Social History  Substance Use Topics  . Smoking status: Current Every Day Smoker -- 1.00 packs/day for 47 years    Types: Cigarettes  . Smokeless tobacco: Never Used  . Alcohol Use: No    Family History  Problem Relation Age of Onset  . Colon cancer Neg Hx   . Pancreatic cancer Mother   . Pancreatic cancer Father   . Colon polyps Mother   . Diabetes Father   . Heart disease Father   . Leukemia Father   . Prostate cancer Father   . Hypertension Father     Allergies  Allergen Reactions  . Amoxicillin-Pot Clavulanate Shortness Of Breath, Itching and Swelling  . Avelox [Moxifloxacin Hcl In Nacl] Swelling    Swelling, itching and shortness  of breath  . Ciprofloxacin Shortness Of Breath, Itching and Swelling  . Keflex [Cephalexin] Shortness Of Breath and Swelling  . Moxifloxacin Shortness Of Breath, Itching and Swelling  . Penicillins Shortness Of Breath, Itching and Swelling  . Sulfa Antibiotics Shortness Of Breath, Itching and Swelling  . Prednisone Other (See Comments)    GI irritation; is able to tolerate injections  . Acyclovir And Related Other (See Comments)    unknown    Medication list has been reviewed and updated.  Current Outpatient Prescriptions on File Prior to Visit  Medication Sig Dispense Refill  . albuterol (PROVENTIL) (2.5 MG/3ML) 0.083% nebulizer solution Inhale 3 mg into the lungs. q 4-6 hours as needed  0  . atorvastatin (LIPITOR) 40 MG tablet Take 1 tablet (  40 mg total) by mouth daily at 6 PM. 30 tablet 2  . dicyclomine (BENTYL) 10 MG capsule Take 1 capsule by mouth three times daily as needed for cramping and spasms. 30 capsule 0  . esomeprazole (NEXIUM) 40 MG capsule Take 1 capsule (40 mg total) by mouth 2 (two) times daily before a meal. 180 capsule 3  . estradiol (VIVELLE-DOT) 0.1 MG/24HR Place 1 patch onto the skin 2 (two) times a week.      . ezetimibe (ZETIA) 10 MG tablet Take 1 tablet (10 mg total) by mouth daily. 90 tablet 1  . fluticasone (FLONASE) 50 MCG/ACT nasal spray Place 2 sprays into both nostrils daily. (Patient taking differently: Place 2 sprays into both nostrils daily as needed. ) 16 g 6  . levofloxacin (LEVAQUIN) 500 MG tablet Take 1 tablet (500 mg total) by mouth daily. 7 tablet 0  . lisinopril (PRINIVIL,ZESTRIL) 10 MG tablet Take 1 tablet (10 mg total) by mouth daily. 90 tablet 3  . LORazepam (ATIVAN) 0.5 MG tablet Take 0.5 mg by mouth at bedtime.   5  . mometasone-formoterol (DULERA) 200-5 MCG/ACT AERO 2 puffs bid 1 Inhaler 2  . sertraline (ZOLOFT) 50 MG tablet Take 50 mg by mouth at bedtime.    Marland Kitchen tiotropium (SPIRIVA HANDIHALER) 18 MCG inhalation capsule Place 1 capsule (18 mcg  total) into inhaler and inhale daily. (Patient taking differently: Place 18 mcg into inhaler and inhale as needed. ) 30 capsule 12   No current facility-administered medications on file prior to visit.    Review of Systems:  As per HPI- otherwise negative.   Physical Examination: Filed Vitals:   01/13/16 1741  BP: 134/64  Pulse: 86  Temp: 98.2 F (36.8 C)   Filed Vitals:   01/13/16 1741  Height: 5\' 2"  (1.575 m)  Weight: 117 lb 3.2 oz (53.162 kg)   Body mass index is 21.43 kg/(m^2). Ideal Body Weight: Weight in (lb) to have BMI = 25: 136.4  GEN: WDWN, NAD, Non-toxic, A & O x 3, tobacco odor, looks well HEENT: Atraumatic, Normocephalic. Neck supple. No masses, No LAD.  Bilateral TM wnl, oropharynx normal.  PEERL,EOMI.   Ears and Nose: No external deformity. CV: RRR, No M/G/R. No JVD. No thrill. No extra heart sounds. PULM: CTA B, no wheezes, crackles, rhonchi. No retractions. No resp. distress. No accessory muscle use. ABD: S, ND, +BS. No rebound. No HSM.  Minimal LLQ tenderness to exam EXTR: No c/c/e NEURO Normal gait.  PSYCH: Normally interactive. Conversant. Not depressed or anxious appearing.  Calm demeanor.    Assessment and Plan: Abdominal pain, left lower quadrant - Plan: metroNIDAZOLE (FLAGYL) 500 MG tablet  Diverticulitis of colon - Plan: metroNIDAZOLE (FLAGYL) 500 MG tablet  Here today with likely mild diverticulitis flare.  She has many allergies to antibiotics and has been treated with flagyl alone in the past.  Will do so again- 500 TID for 7- 10 days Advised a soft low residue diet She will seek care if not feeling better in the next 1-2 days- Sooner if worse.     Signed Lamar Blinks, MD

## 2016-01-13 NOTE — Progress Notes (Signed)
Pre visit review using our clinic review tool, if applicable. No additional management support is needed unless otherwise documented below in the visit note. 

## 2016-01-18 ENCOUNTER — Telehealth: Payer: Self-pay | Admitting: Family Medicine

## 2016-01-18 NOTE — Telephone Encounter (Signed)
She need to get these refills from the specialist. Dr.Brodie prescribed them.  KP

## 2016-01-18 NOTE — Telephone Encounter (Signed)
Called patient to inform her of below. Patient states she understood.

## 2016-01-18 NOTE — Telephone Encounter (Signed)
°  Relationship to patient: Self Can be reached: [phone]   Pharmacy:  [id number] - [place], [place] [address] [phone] (Phone) [phone] (Fax)         Reason for call: Patient request refill on dicyclomine (BENTYL) 10 MG capsule

## 2016-02-17 ENCOUNTER — Encounter: Payer: Self-pay | Admitting: Gastroenterology

## 2016-02-17 ENCOUNTER — Ambulatory Visit (INDEPENDENT_AMBULATORY_CARE_PROVIDER_SITE_OTHER): Payer: BLUE CROSS/BLUE SHIELD | Admitting: Gastroenterology

## 2016-02-17 VITALS — BP 110/70 | HR 76 | Ht 61.5 in | Wt 112.4 lb

## 2016-02-17 DIAGNOSIS — K219 Gastro-esophageal reflux disease without esophagitis: Secondary | ICD-10-CM | POA: Diagnosis not present

## 2016-02-17 DIAGNOSIS — K589 Irritable bowel syndrome without diarrhea: Secondary | ICD-10-CM | POA: Diagnosis not present

## 2016-02-17 DIAGNOSIS — K5901 Slow transit constipation: Secondary | ICD-10-CM

## 2016-02-17 DIAGNOSIS — R1032 Left lower quadrant pain: Secondary | ICD-10-CM | POA: Diagnosis not present

## 2016-02-17 MED ORDER — DICYCLOMINE HCL 10 MG PO CAPS: ORAL_CAPSULE | ORAL | Status: DC

## 2016-02-17 NOTE — Progress Notes (Signed)
 GI Progress Note    Chief Complaint: Constipation  Subjective History:  Last seen 09/2014 for dysphagia. Previously yrs of LLQ pain and recurrent diverticulitis, sigmoid resection in 2010. Continue episodes pain afterwards. No diverticulitis on CT 02/2014 when having pain. Last colonoscopy 04/2013 Olevia Perches  - scattered diverticulosis, healthy appearing anastomosis and diminutive tubular adenoma. [name] has ongoing complaints of chronic, and at times constant, left-sided abdominal pain and constipation. MiraLAX has not been of much help. She takes dicyclomine as needed and reports significant improvement in the pain with that. She denies loss of appetite or rectal bleeding. She describes chronic pyrosis and regurgitation for which she requires Nexium 40 g twice a day. She denies dysphagia odynophagia or vomiting. ROS: Cardiovascular:  no chest pain Respiratory: no dyspnea  The patient's Past Medical, Family and Social History were reviewed and are on file in the EMR. She continues to smoke and was counseled to quit, her multiple health reasons including her GERD. Objective:  Med list reviewed  Vital signs in last 24 hrs: Filed Vitals:   02/17/16 0945  BP: 110/70  Pulse: 76    Physical Exam   HEENT: sclera anicteric, oral mucosa moist without lesions. Normal vocal quality  Neck: supple, no thyromegaly, JVD or lymphadenopathy  Cardiac: RRR without murmurs, S1S2 heard, no peripheral edema  Pulm: clear to auscultation bilaterally, normal RR and effort noted  Abdomen: soft,Mild left sided tenderness, with active bowel sounds. No guarding or palpable hepatosplenomegaly.  Skin; warm and dry, no jaundice or rash    @ASSESSMENTPLANBEGIN @ Assessment: Encounter Diagnoses  Name Primary?  . Irritable bowel syndrome without diarrhea Yes  . Slow transit constipation   . LLQ abdominal pain   . Gastroesophageal reflux disease without esophagitis   . Gastroesophageal reflux  disease, esophagitis presence not specified    She has many years of chronic functional left-sided abdominal pain and constipation that is exacerbated by previous diverticulitis surgery affecting the rectosigmoid motility.  Plan: I refilled dicyclomine, but cautioned her to take it sparingly since it may worsen constipation I refilled Nexium 40 mg twice daily, and again strongly advised her to quit smoking. We gave her a trial of Linzess 72 g samples once daily. Stents the possible side effect of diarrhea. She will call us next week with an update, and if that medicine is helping I will send a prescription   Total time 25 minutes, over half spent in counseling and coordination of care.  Topics discussed: Treatment of GERD constipation and IBS The chronic nature and treatment of IBS were discussed. The cause is not completely understood, but is likely to be a combination of genetics, diet, stress, visceral hypersensitivity and the gut microbiome.  The available treatments aim to control symptoms even if unable to "cure" the condition.  While not physically harmful, IBS can have a significant impact on quality of life.   Leah [name]

## 2016-02-17 NOTE — Patient Instructions (Signed)
Linzess 72 mcg samples have been given to you today. Please take one a day.  If you are age 67 or older, your body mass index should be between 23-30. Your Body mass index is 20.9 kg/(m^2). If this is out of the aforementioned range listed, please consider follow up with your Primary Care Provider.  If you are age 6 or younger, your body mass index should be between 19-25. Your Body mass index is 20.9 kg/(m^2). If this is out of the aformentioned range listed, please consider follow up with your Primary Care Provider.   Thank you for choosing  GI  Dr Wilfrid Lund III

## 2016-03-30 ENCOUNTER — Encounter: Payer: Self-pay | Admitting: Medical

## 2016-03-30 ENCOUNTER — Ambulatory Visit (HOSPITAL_BASED_OUTPATIENT_CLINIC_OR_DEPARTMENT_OTHER)
Admission: RE | Admit: 2016-03-30 | Discharge: 2016-03-30 | Disposition: A | Discharge status: Home / Self Care | Payer: BLUE CROSS/BLUE SHIELD | Source: Ambulatory Visit | Attending: Medical | Admitting: Medical

## 2016-03-30 ENCOUNTER — Ambulatory Visit (INDEPENDENT_AMBULATORY_CARE_PROVIDER_SITE_OTHER): Payer: BLUE CROSS/BLUE SHIELD | Admitting: Medical

## 2016-03-30 ENCOUNTER — Telehealth: Payer: Self-pay

## 2016-03-30 VITALS — BP 139/70 | HR 77 | Temp 98.0°F | Ht 61.5 in | Wt 113.2 lb

## 2016-03-30 DIAGNOSIS — E785 Hyperlipidemia, unspecified: Secondary | ICD-10-CM | POA: Diagnosis not present

## 2016-03-30 DIAGNOSIS — Z87891 Personal history of nicotine dependence: Secondary | ICD-10-CM

## 2016-03-30 DIAGNOSIS — R6883 Chills (without fever): Secondary | ICD-10-CM

## 2016-03-30 DIAGNOSIS — I10 Essential (primary) hypertension: Secondary | ICD-10-CM | POA: Diagnosis not present

## 2016-03-30 DIAGNOSIS — R5383 Other fatigue: Secondary | ICD-10-CM

## 2016-03-30 DIAGNOSIS — R059 Cough, unspecified: Secondary | ICD-10-CM

## 2016-03-30 DIAGNOSIS — Z720 Tobacco use: Secondary | ICD-10-CM | POA: Diagnosis present

## 2016-03-30 DIAGNOSIS — I700 Atherosclerosis of aorta: Secondary | ICD-10-CM | POA: Diagnosis not present

## 2016-03-30 DIAGNOSIS — Z8639 Personal history of other endocrine, nutritional and metabolic disease: Secondary | ICD-10-CM

## 2016-03-30 DIAGNOSIS — R05 Cough: Secondary | ICD-10-CM

## 2016-03-30 LAB — COMPREHENSIVE METABOLIC PANEL
ALBUMIN: 3.9 g/dL (ref 3.5–5.2)
ALK PHOS: 67 U/L (ref 39–117)
ALT: 8 U/L (ref 0–35)
AST: 11 U/L (ref 0–37)
BILIRUBIN TOTAL: 0.2 mg/dL (ref 0.2–1.2)
BUN: 10 mg/dL (ref 6–23)
CHLORIDE: 105 meq/L (ref 96–112)
CO2: 31 mEq/L (ref 19–32)
Calcium: 8.8 mg/dL (ref 8.4–10.5)
Creatinine, Ser: 0.76 mg/dL (ref 0.40–1.20)
GFR: 80.48 mL/min (ref >60.00)
Glucose, Bld: 94 mg/dL (ref 70–99)
Potassium: 4.4 mEq/L (ref 3.5–5.1)
Sodium: 140 mEq/L (ref 135–145)
TOTAL PROTEIN: 6.7 g/dL (ref 6.0–8.3)

## 2016-03-30 LAB — CBC WITH DIFFERENTIAL/PLATELET
Basophils Absolute: 0 10*3/uL (ref 0.0–0.1)
Basophils Relative: 0.7 % (ref 0.0–3.0)
EOS ABS: 0.3 10*3/uL (ref 0.0–0.7)
Eosinophils Relative: 4.7 % (ref 0.0–5.0)
HEMATOCRIT: 39 % (ref 36.0–46.0)
HEMOGLOBIN: 13.1 g/dL (ref 12.0–15.0)
LYMPHS PCT: 25.4 % (ref 12.0–46.0)
Lymphs Abs: 1.6 10*3/uL (ref 0.7–4.0)
MCHC: 33.6 g/dL (ref 30.0–36.0)
MCV: 89.7 fl (ref 78.0–100.0)
Monocytes Absolute: 0.4 10*3/uL (ref 0.1–1.0)
Monocytes Relative: 6.5 % (ref 3.0–12.0)
Neutro Abs: 4.1 10*3/uL (ref 1.4–7.7)
Neutrophils Relative %: 62.7 % (ref 43.0–77.0)
Platelets: 233 10*3/uL (ref 150.0–400.0)
RBC: 4.35 Mil/uL (ref 3.87–5.11)
RDW: 14.5 % (ref 11.5–15.5)
WBC: 6.5 10*3/uL (ref 4.0–10.5)

## 2016-03-30 LAB — TSH: TSH: 1.92 u[IU]/mL (ref 0.35–4.50)

## 2016-03-30 LAB — LIPID PANEL
CHOL/HDL RATIO: 6
CHOLESTEROL: 209 mg/dL — AB (ref 0–200)
HDL: 33.9 mg/dL — ABNORMAL LOW (ref >39.00)
LDL CALC: 144 mg/dL — AB (ref 0–99)
NonHDL: 175.39
TRIGLYCERIDES: 158 mg/dL — AB (ref 0.0–149.0)
VLDL: 31.6 mg/dL (ref 0.0–40.0)

## 2016-03-30 LAB — POC URINALSYSI DIPSTICK (AUTOMATED)
Bilirubin, UA: NEGATIVE
GLUCOSE UA: NEGATIVE
KETONES UA: NEGATIVE
Leukocytes, UA: NEGATIVE
Nitrite, UA: NEGATIVE
Protein, UA: NEGATIVE
RBC UA: NEGATIVE
Urobilinogen, UA: 0.2
pH, UA: 5.5

## 2016-03-30 LAB — VITAMIN B12: VITAMIN B 12: 515 pg/mL (ref 211–911)

## 2016-03-30 MED ORDER — EZETIMIBE 10 MG PO TABS: 10.0000 mg | ORAL_TABLET | Freq: Every day | ORAL | 1 refills | Status: DC

## 2016-03-30 MED ORDER — ATORVASTATIN CALCIUM 40 MG PO TABS: 40.0000 mg | ORAL_TABLET | Freq: Every day | ORAL | 2 refills | Status: DC

## 2016-03-30 MED ORDER — LISINOPRIL 20 MG PO TABS: 20.0000 mg | ORAL_TABLET | Freq: Every day | ORAL | 2 refills | Status: DC

## 2016-03-30 NOTE — Telephone Encounter (Signed)
Pt request refill for Ezetimibe and also Lipitor, she says that she was seen today and hoped to have it sent in to pharmacy.

## 2016-03-30 NOTE — Telephone Encounter (Signed)
Pt cholesterol is better than before. I am going to refill her current lipids. Repeat lipid panel in 3 months. Other test to be reviewed will contact when those in and all reviewed.

## 2016-03-30 NOTE — Patient Instructions (Signed)
For fatigue and history of low b12 will get labs.   For high cholesterol hx get lipid panel  Your bp has been high. Top number often 150 by your readings. Keep checking. If that is case then start lisinopril 20 mg a day. Rx sent in.  For history of smoking, chills, and fatigue get cxr. Evalate if any walking pneumonia.   For fatigue and chills as well get UA. If indicators of infection then get culture.  Follow up 7-10 days or as needed.

## 2016-03-30 NOTE — Telephone Encounter (Deleted)
Also sugar on labs were 112. Low sugar diet. If on physical his sugar still mild elevated may rx metformin. Will discuss further when in for physical.

## 2016-03-30 NOTE — Addendum Note (Signed)
Addended by: Caffie Pinto on: 03/30/2016 11:10 AM   Modules accepted: Orders

## 2016-03-30 NOTE — Progress Notes (Signed)
Subjective:    Patient ID: Leah [name], female    DOB: [birth year], 70 y.o.   MRN: [id number]  HPI   Pt in feeling tired and worn out for one month. This is getting. Pt in past had some occasional fatigue in past. States hx of low b12. Pt is fasting today.  Pt mentions some occasional chills but no fever. No sweats. Pt denies coughing. No reported shortness of breath. Pt bp is initially high when [place]. Pt at times states mild head pressure. Occasional HA(but not now). But no gross motor or sensory function deficits reported or found on exam.  No uti type symptoms on review.  Pt is a smoker for years.   Pt bp systolic ranging [id number] of time 150). Diastolic she state [id number]. Highest diastolic 85.     Review of Systems  Constitutional: Positive for chills and fatigue.  HENT: Positive for postnasal drip. Negative for congestion, ear pain, sinus pressure and sore throat.        Mild alleries this time of the year.  Respiratory: Negative for cough, chest tightness, shortness of breath and wheezing.   Cardiovascular: Negative for chest pain and palpitations.  Gastrointestinal: Negative for abdominal distention, abdominal pain, blood in stool, constipation, diarrhea, nausea and vomiting.  Genitourinary: Negative for difficulty urinating, dysuria, frequency, pelvic pain and urgency.  Musculoskeletal: Negative for back pain.  Skin: Negative for rash.  Neurological:       See hpi.  Hematological: Negative for adenopathy. Does not bruise/bleed easily.  Psychiatric/Behavioral: Negative for behavioral problems, confusion, hallucinations and suicidal ideas. The patient is not nervous/anxious.     Past Medical History:  Diagnosis Date  . Anxiety   . Barrett esophagus   . Bladder pain   . Chronic bronchitis ([place])    per pt last episode july 2016  . Complication of anesthesia    slow to wake  . Depression   . Diverticulosis   . Frequency of urination   . GERD  (gastroesophageal reflux disease)   . Hiatal hernia   . History of adenomatous polyp of colon    tubular adenoma  . History of chronic gastritis   . History of diverticulitis of colon   . History of esophageal dilatation    for stricture  . History of esophageal spasm   . Hyperlipidemia   . Hypertension   . IBS (irritable bowel syndrome)   . Interstitial cystitis   . Productive cough    intermittant  . Smokers' cough ([place])   . Urgency of urination      Social History   Social History  . Marital status: Married    Spouse name: N/A  . Number of children: N/A  . Years of education: N/A   Occupational History  . Not on file.   Social History Main Topics  . Smoking status: Current Every Day Smoker    Packs/day: 1.00    Years: 47.00    Types: Cigarettes  . Smokeless tobacco: Never Used     Comment: form given 02/17/16  . Alcohol use No  . Drug use: No  . Sexual activity: Not on file   Other Topics Concern  . Not on file   Social History Narrative  . No narrative on file    Past Surgical History:  Procedure Laterality Date  . ABDOMINAL HYSTERECTOMY  1975  . ANTERIOR CERVICAL DECOMP/DISCECTOMY FUSION N/A 09/07/2014   Procedure: Exploration of Fusion and Removal of Anterior Cervical Hematoma;  Surgeon:  Newman Pies, MD;  Location: [facility];  Service: Neurosurgery;  Laterality: N/A;  . ANTERIOR FUSION CERVICAL SPINE  09/03/2014   C5 -- C7  . CARPAL TUNNEL RELEASE Right 1993  . COLONOSCOPY  last one 04-30-2013  . CYSTO WITH HYDRODISTENSION N/A 06/08/2015   Procedure: CYSTOSCOPY/HYDRODISTENSION INSTILLATION OF MARCAINE AND PYRIDIUM;  Surgeon: Irine Seal, MD;  Location: [facility];  Service: Urology;  Laterality: N/A;  . CYSTO/  HOD/  BLADDER BX  1990's  . ESOPHAGOGASTRODUODENOSCOPY  last one 09-11-2012  . LAPAROSCOPIC APPENDECTOMY N/A 09/21/2013   Procedure: APPENDECTOMY LAPAROSCOPIC;  Surgeon: Rolm Bookbinder, MD;  Location: [facility];  Service:  General;  Laterality: N/A;  . LAPAROSCOPIC CHOLECYSTECTOMY  2002  . LAPAROSCOPY SIGMOID COLECTOMY  10-05-2008   diverticulitis  . MICROLARYNGOSCOPY  09-27-2006   w/ true vocal cord stripping  and bilateral bx's of lesion's (benign)  . SINUS SURGERY WITH [place]  . [place]  . TONSILLECTOMY AND ADENOIDECTOMY  1964    Family History  Problem Relation Age of Onset  . Colon cancer Neg Hx   . Pancreatic cancer Mother   . Pancreatic cancer Father   . Colon polyps Mother   . Diabetes Father   . Heart disease Father   . Leukemia Father   . Prostate cancer Father   . Hypertension Father     Allergies  Allergen Reactions  . Amoxicillin-Pot Clavulanate Shortness Of Breath, Itching and Swelling  . Avelox [Moxifloxacin Hcl In Nacl] Swelling    Swelling, itching and shortness of breath  . Ciprofloxacin Shortness Of Breath, Itching and Swelling  . Keflex [Cephalexin] Shortness Of Breath and Swelling  . Moxifloxacin Shortness Of Breath, Itching and Swelling  . Penicillins Shortness Of Breath, Itching and Swelling  . Sulfa Antibiotics Shortness Of Breath, Itching and Swelling  . Prednisone Other (See Comments)    GI irritation; is able to tolerate injections  . Acyclovir And Related Other (See Comments)    unknown    Current Outpatient Prescriptions on File Prior to Visit  Medication Sig Dispense Refill  . albuterol (PROVENTIL) (2.5 MG/3ML) 0.083% nebulizer solution Inhale 3 mg into the lungs. q 4-6 hours as needed  0  . atorvastatin (LIPITOR) 40 MG tablet Take 1 tablet (40 mg total) by mouth daily at 6 PM. 30 tablet 2  . dicyclomine (BENTYL) 10 MG capsule Take 1 capsule by mouth three times daily as needed for cramping and spasms. 60 capsule 3  . esomeprazole (NEXIUM) 40 MG capsule Take 1 capsule (40 mg total) by mouth 2 (two) times daily before a meal. 180 capsule 3  . estradiol (VIVELLE-DOT) 0.1 MG/24HR Place 1 patch onto the skin 2 (two) times a  week.      . ezetimibe (ZETIA) 10 MG tablet Take 1 tablet (10 mg total) by mouth daily. (Patient taking differently: Take 10 mg by mouth daily as needed. ) 90 tablet 1  . fluticasone (FLONASE) 50 MCG/ACT nasal spray Place 2 sprays into both nostrils daily. (Patient taking differently: Place 2 sprays into both nostrils daily as needed. ) 16 g 6  . lisinopril (PRINIVIL,ZESTRIL) 10 MG tablet Take 1 tablet (10 mg total) by mouth daily. 90 tablet 3  . LORazepam (ATIVAN) 0.5 MG tablet Take 0.5 mg by mouth at bedtime.   5  . mometasone-formoterol (DULERA) 200-5 MCG/ACT AERO 2 puffs bid 1 Inhaler 2  . sertraline (ZOLOFT) 50 MG tablet Take 50 mg by mouth  at bedtime.    Marland Kitchen tiotropium (SPIRIVA HANDIHALER) 18 MCG inhalation capsule Place 1 capsule (18 mcg total) into inhaler and inhale daily. (Patient taking differently: Place 18 mcg into inhaler and inhale as needed. ) 30 capsule 12  . metroNIDAZOLE (FLAGYL) 500 MG tablet Take 1 tablet (500 mg total) by mouth 3 (three) times daily. Take 1 pill twice daily for one week. NO alcohol (Patient not taking: Reported on 03/30/2016) 30 tablet 0   No current facility-administered medications on file prior to visit.     Pulse 77   Temp 98 F (36.7 C) (Oral)   Ht 5' 1.5" (1.562 m)   Wt 113 lb 3.2 oz (51.3 kg)   SpO2 95%   BMI 21.04 kg/m       Objective:   Physical Exam  General Mental Status- Alert. General Appearance- Not in acute distress.   Skin General: Color- Normal Color. Moisture- Normal Moisture.    HEENT Head- Normal. Ear Auditory Canal - Left- Normal. Right - Normal.Tympanic Membrane- Left- Normal. Right- Normal. Eye Sclera/Conjunctiva- Left- Normal. Right- Normal. Nose & Sinuses Nasal Mucosa- Left-  Boggy and Congested. Right-  Boggy and  Congested.Bilateral no  maxillary and no  frontal sinus pressure. Mouth & Throat Lips: Upper Lip- Normal: no dryness, cracking, pallor, cyanosis, or vesicular eruption. Lower Lip-Normal: no dryness,  cracking, pallor, cyanosis or vesicular eruption. Buccal Mucosa- Bilateral- No Aphthous ulcers. Oropharynx- No Discharge or Erythema. +pnd. Tonsils: Characteristics- Bilateral- No Erythema or Congestion. Size/Enlargement- Bilateral- No enlargement. Discharge- bilateral-None.   Chest and Lung Exam Auscultation: Breath Sounds:-Clear even and unlabored.  Cardiovascular Auscultation:Rythm- Regular, rate and rhythm. Murmurs & Other Heart Sounds:Ausculatation of the heart reveal- No Murmurs.  Lymphatic Head & Neck General Head & Neck Lymphatics: Bilateral: Description- No Localized lymphadenopathy.   Neck Carotid Arteries- Normal color. Moisture- Normal Moisture. No carotid bruits. No JVD.  Chest and Lung Exam Auscultation: Breath Sounds:-even and unlabored but shallow.  Cardiovascular Auscultation:Rythm- Regular. Murmurs & Other Heart Sounds:Auscultation of the heart reveals- No Murmurs.  Abdomen Inspection:-Inspeection Normal. Palpation/Percussion:Note:No mass. Palpation and Percussion of the abdomen reveal- Non Tender, Non Distended + BS, no rebound or guarding.    Neurologic Cranial Nerve exam:- CN III-XII intact(No nystagmus), symmetric smile. Finger to Nose:- Normal/Intact Strength:- 5/5 equal and symmetric strength both upper and lower extremities.      Assessment & Plan:  For fatigue and history of low b12 will get labs.   For high cholesterol hx get lipid panel  Your bp has been high. Top number often 150 by your readings. Keep checking. If that is case then start lisinopril 20 mg a day. Rx sent in.  For history of smoking, chills, and fatigue get cxr. Evalate if any walking pneumonia.   For fatigue and chills as well get UA. If indicators of infection then get culture.  Follow up 7-10 days or as needed.  For mild pnd and hx of allergies advise start flonse otc  Raford Brissett, Percell Miller, [place]

## 2016-03-30 NOTE — Progress Notes (Signed)
Pre visit review using our clinic tool,if applicable. No additional management support is needed unless otherwise documented below in the visit note.  

## 2016-03-30 NOTE — Telephone Encounter (Signed)
Please disregard las enty on sugar.

## 2016-03-31 ENCOUNTER — Telehealth: Payer: Self-pay | Admitting: Medical

## 2016-03-31 MED ORDER — ATORVASTATIN CALCIUM 40 MG PO TABS: 40.0000 mg | ORAL_TABLET | Freq: Every day | ORAL | 2 refills | Status: DC

## 2016-03-31 MED ORDER — EZETIMIBE 10 MG PO TABS: 10.0000 mg | ORAL_TABLET | Freq: Every day | ORAL | 2 refills | Status: DC

## 2016-03-31 MED ORDER — DOXYCYCLINE HYCLATE 100 MG PO TABS: 100.0000 mg | ORAL_TABLET | Freq: Two times a day (BID) | ORAL | 0 refills | Status: DC

## 2016-03-31 NOTE — Telephone Encounter (Signed)
Pt called in. She says that pharmacy is stating that they haven't received a Rx for Lipitor and [place]    Confirmed pharmacy, pt states that its correct.

## 2016-03-31 NOTE — Telephone Encounter (Signed)
Called pt and explained xray findings. She feels some mild chest congestion. He would prefer doxycycline than zpack. She states tolerates well and more effective per her experience.

## 2016-04-05 ENCOUNTER — Ambulatory Visit (INDEPENDENT_AMBULATORY_CARE_PROVIDER_SITE_OTHER): Payer: BLUE CROSS/BLUE SHIELD | Admitting: Medical

## 2016-04-05 ENCOUNTER — Telehealth: Payer: Self-pay | Admitting: Medical

## 2016-04-05 ENCOUNTER — Encounter: Payer: Self-pay | Admitting: Medical

## 2016-04-05 ENCOUNTER — Ambulatory Visit (HOSPITAL_BASED_OUTPATIENT_CLINIC_OR_DEPARTMENT_OTHER)
Admission: RE | Admit: 2016-04-05 | Discharge: 2016-04-05 | Disposition: A | Discharge status: Home / Self Care | Payer: BLUE CROSS/BLUE SHIELD | Source: Ambulatory Visit | Attending: Medical | Admitting: Medical

## 2016-04-05 VITALS — BP 149/67 | HR 66 | Temp 98.4°F | Ht 61.5 in | Wt 114.8 lb

## 2016-04-05 DIAGNOSIS — Z8709 Personal history of other diseases of the respiratory system: Secondary | ICD-10-CM | POA: Diagnosis not present

## 2016-04-05 DIAGNOSIS — Z87891 Personal history of nicotine dependence: Secondary | ICD-10-CM

## 2016-04-05 DIAGNOSIS — Z720 Tobacco use: Secondary | ICD-10-CM | POA: Diagnosis not present

## 2016-04-05 DIAGNOSIS — J441 Chronic obstructive pulmonary disease with (acute) exacerbation: Secondary | ICD-10-CM

## 2016-04-05 DIAGNOSIS — J209 Acute bronchitis, unspecified: Secondary | ICD-10-CM | POA: Diagnosis present

## 2016-04-05 DIAGNOSIS — J0110 Acute frontal sinusitis, unspecified: Secondary | ICD-10-CM

## 2016-04-05 MED ORDER — MOMETASONE FURO-FORMOTEROL FUM 200-5 MCG/ACT IN AERO: INHALATION_SPRAY | RESPIRATORY_TRACT | 2 refills | Status: DC

## 2016-04-05 MED ORDER — CLARITHROMYCIN ER 500 MG PO TB24: 1000.0000 mg | ORAL_TABLET | Freq: Every day | ORAL | 0 refills | Status: DC

## 2016-04-05 NOTE — Progress Notes (Signed)
Pre visit review using our clinic tool,if applicable. No additional management support is needed unless otherwise documented below in the visit note.  

## 2016-04-05 NOTE — Patient Instructions (Addendum)
You appear to have bronchitis and sinus infection. We started you on doxycycline and you report increasing nasal congestion, sinus pressure and chest congestion despite treatment. Will repeat chest xray to determine if over last 4-5 days any pneumonia showing. Based on your allergy history option are limited. Depending on xray finding may rx biaxin xl(you mentioned zpack never work for you).  For hx of smoking and copd in past use albuterol if needed. You have neb machine and solution. I am going to refill your dulera(pt clarifies used in past no reaction/worked). Follow up 2-5 days or as needed.  See A and P. Addended plan post chest xray.

## 2016-04-05 NOTE — Progress Notes (Signed)
Subjective:    Patient ID: Leah [name], female    DOB: Nov 08, 1945, 70 y.o.   MRN: [id number]  HPI   Pt in states feeling nasal congested with sinus pressure. Pt seen late last week. By chest xray it appeared she may have bronchitis. Blood work for fatigue looked ok. No wbc elevation. Discussed with patient she may have bronchitis and we decided to use doxycycline as approached long weekend. She had been on this before and could tolerate. Pt also with sinus pressure and pain in rt upper lung field/rt upper thorax pain. Pt coughing some at night.   Pt states she has used levaquin in the past. She states no reaction. But on our list has used moxifloxin.   No pain in her legs.  Pt has 1 pack a day smoker for 47 years.   Review of Systems  Constitutional: Positive for chills and fatigue. Negative for diaphoresis and fever.  HENT: Positive for sinus pressure. Negative for dental problem, ear pain, hearing loss, nosebleeds, rhinorrhea, sneezing and trouble swallowing.   Respiratory: Positive for cough. Negative for shortness of breath and wheezing.   Cardiovascular: Negative for chest pain and palpitations.  Gastrointestinal: Negative for abdominal pain.  Genitourinary: Negative for dysuria and enuresis.  Musculoskeletal: Negative for back pain, gait problem and joint swelling.  Skin: Negative for rash.  Neurological: Negative for dizziness and light-headedness.  Hematological: Negative for adenopathy. Does not bruise/bleed easily.  Psychiatric/Behavioral: Negative for behavioral problems and confusion.   Past Medical History:  Diagnosis Date  . Anxiety   . Barrett esophagus   . Bladder pain   . Chronic bronchitis ([place])    per pt last episode july 2016  . Complication of anesthesia    slow to wake  . Depression   . Diverticulosis   . Frequency of urination   . GERD (gastroesophageal reflux disease)   . Hiatal hernia   . History of adenomatous polyp of colon    tubular  adenoma  . History of chronic gastritis   . History of diverticulitis of colon   . History of esophageal dilatation    for stricture  . History of esophageal spasm   . Hyperlipidemia   . Hypertension   . IBS (irritable bowel syndrome)   . Interstitial cystitis   . Productive cough    intermittant  . Smokers' cough ([place])   . Urgency of urination      Social History   Social History  . Marital status: Married    Spouse name: N/A  . Number of children: N/A  . Years of education: N/A   Occupational History  . Not on file.   Social History Main Topics  . Smoking status: Current Every Day Smoker    Packs/day: 1.00    Years: 47.00    Types: Cigarettes  . Smokeless tobacco: Never Used     Comment: form given 02/17/16  . Alcohol use No  . Drug use: No  . Sexual activity: Not on file   Other Topics Concern  . Not on file   Social History Narrative  . No narrative on file    Past Surgical History:  Procedure Laterality Date  . ABDOMINAL HYSTERECTOMY  1975  . ANTERIOR CERVICAL DECOMP/DISCECTOMY FUSION N/A 09/07/2014   Procedure: Exploration of Fusion and Removal of Anterior Cervical Hematoma;  Surgeon: Newman Pies, MD;  Location: [facility];  Service: Neurosurgery;  Laterality: N/A;  . ANTERIOR FUSION CERVICAL SPINE  09/03/2014  C5 -- C7  . CARPAL TUNNEL RELEASE Right 1993  . COLONOSCOPY  last one 04-30-2013  . CYSTO WITH HYDRODISTENSION N/A 06/08/2015   Procedure: CYSTOSCOPY/HYDRODISTENSION INSTILLATION OF MARCAINE AND PYRIDIUM;  Surgeon: Irine Seal, MD;  Location: [facility];  Service: Urology;  Laterality: N/A;  . CYSTO/  HOD/  BLADDER BX  1990's  . ESOPHAGOGASTRODUODENOSCOPY  last one 09-11-2012  . LAPAROSCOPIC APPENDECTOMY N/A 09/21/2013   Procedure: APPENDECTOMY LAPAROSCOPIC;  Surgeon: Rolm Bookbinder, MD;  Location: [facility];  Service: General;  Laterality: N/A;  . LAPAROSCOPIC CHOLECYSTECTOMY  2002  . LAPAROSCOPY SIGMOID COLECTOMY  10-05-2008     diverticulitis  . MICROLARYNGOSCOPY  09-27-2006   w/ true vocal cord stripping  and bilateral bx's of lesion's (benign)  . SINUS SURGERY WITH [place]  . [place]  . TONSILLECTOMY AND ADENOIDECTOMY  1964    Family History  Problem Relation Age of Onset  . Colon cancer Neg Hx   . Pancreatic cancer Mother   . Pancreatic cancer Father   . Colon polyps Mother   . Diabetes Father   . Heart disease Father   . Leukemia Father   . Prostate cancer Father   . Hypertension Father     Allergies  Allergen Reactions  . Amoxicillin-Pot Clavulanate Shortness Of Breath, Itching and Swelling  . Avelox [Moxifloxacin Hcl In Nacl] Swelling    Swelling, itching and shortness of breath  . Ciprofloxacin Shortness Of Breath, Itching and Swelling  . Keflex [Cephalexin] Shortness Of Breath and Swelling  . Moxifloxacin Shortness Of Breath, Itching and Swelling  . Penicillins Shortness Of Breath, Itching and Swelling  . Sulfa Antibiotics Shortness Of Breath, Itching and Swelling  . Prednisone Other (See Comments)    GI irritation; is able to tolerate injections  . Acyclovir And Related Other (See Comments)    unknown    Current Outpatient Prescriptions on File Prior to Visit  Medication Sig Dispense Refill  . albuterol (PROVENTIL) (2.5 MG/3ML) 0.083% nebulizer solution Inhale 3 mg into the lungs. q 4-6 hours as needed  0  . atorvastatin (LIPITOR) 40 MG tablet Take 1 tablet (40 mg total) by mouth daily at 6 PM. 30 tablet 2  . dicyclomine (BENTYL) 10 MG capsule Take 1 capsule by mouth three times daily as needed for cramping and spasms. 60 capsule 3  . doxycycline (VIBRA-TABS) 100 MG tablet Take 1 tablet (100 mg total) by mouth 2 (two) times daily. 20 tablet 0  . doxycycline (VIBRA-TABS) 100 MG tablet Take 1 tablet (100 mg total) by mouth 2 (two) times daily. Can give generic or caps. If dupllicate rx just fill one time. Had computer glitch and wasn't sure first rx  went. 20 tablet 0  . esomeprazole (NEXIUM) 40 MG capsule Take 1 capsule (40 mg total) by mouth 2 (two) times daily before a meal. 180 capsule 3  . estradiol (VIVELLE-DOT) 0.1 MG/24HR Place 1 patch onto the skin 2 (two) times a week.      . ezetimibe (ZETIA) 10 MG tablet Take 1 tablet (10 mg total) by mouth daily. 30 tablet 2  . fluticasone (FLONASE) 50 MCG/ACT nasal spray Place 2 sprays into both nostrils daily. (Patient taking differently: Place 2 sprays into both nostrils daily as needed. ) 16 g 6  . lisinopril (PRINIVIL,ZESTRIL) 20 MG tablet Take 1 tablet (20 mg total) by mouth daily. 30 tablet 2  . LORazepam (ATIVAN) 0.5 MG tablet Take 0.5 mg by mouth  at bedtime.   5  . mometasone-formoterol (DULERA) 200-5 MCG/ACT AERO 2 puffs bid 1 Inhaler 2  . sertraline (ZOLOFT) 50 MG tablet Take 50 mg by mouth at bedtime.    Marland Kitchen tiotropium (SPIRIVA HANDIHALER) 18 MCG inhalation capsule Place 1 capsule (18 mcg total) into inhaler and inhale daily. (Patient taking differently: Place 18 mcg into inhaler and inhale as needed. ) 30 capsule 12   No current facility-administered medications on file prior to visit.     BP (!) 149/67   Pulse 66   Temp 98.4 F (36.9 C) (Oral)   Ht 5' 1.5" (1.562 m)   Wt 114 lb 12.8 oz (52.1 kg)   SpO2 98%   BMI 21.34 kg/m       Objective:   Physical Exam  General  Mental Status - Alert. General Appearance - Well groomed. Not in acute distress.  Skin Rashes- No Rashes.  HEENT Head- Normal. Ear Auditory Canal - Left- Normal. Right - Normal.Tympanic Membrane- Left- Normal. Right- Normal. Eye Sclera/Conjunctiva- Left- Normal. Right- Normal. Nose & Sinuses Nasal Mucosa- Left-  Boggy and Congested. Right-  Boggy and  Congested.Bilateral  No maxillary but faint frontal sinus pressure. Mouth & Throat Lips: Upper Lip- Normal: no dryness, cracking, pallor, cyanosis, or vesicular eruption. Lower Lip-Normal: no dryness, cracking, pallor, cyanosis or vesicular  eruption. Buccal Mucosa- Bilateral- No Aphthous ulcers. Oropharynx- No Discharge or Erythema. Tonsils: Characteristics- Bilateral- No Erythema or Congestion. Size/Enlargement- Bilateral- No enlargement. Discharge- bilateral-None.  Neck Neck- Supple. No Masses. No jvd.    Chest and Lung Exam Auscultation: Breath Sounds:-even and unlabored. Shallow respirations.  Cardiovascular Auscultation:Rythm- Regular, rate and rhythm. Murmurs & Other Heart Sounds:Ausculatation of the heart reveal- No Murmurs.  Lymphatic Head & Neck General Head & Neck Lymphatics: Bilateral: Description- No Localized lymphadenopathy.  Lower ext- no pedal edema and negative homans signs.       Assessment & Plan:  You appear to have bronchitis and sinus infection. We started you on doxycycline and you report increasing nasal congestion, sinus pressure and chest congestion despite treatment. Will repeat chest xray to determine if over last 4-5 days any pneumonia showing. Based on your allergy history option are limited. Depending on xray finding may rx biaxin xl(you mentioned zpack never work for you).  For hx of smoking and copd in past use albuterol if needed. You have neb machine and solution. I am going to refill your dulera. Follow up 2-5 days or as needed.  Pt chest xray showed improved bronchitic changes. No infiltrate. She thinks clinically worsening despite doxy. Will rx biaxin xl. Will also advise using neb treatment as well. Start [place]. She may be having some reactive airway disease.(will get LPN to advise pt/call her. I tried to call her but no answer)  Will advise stop doxy.  Deja Pisarski, Percell Miller, PA-C

## 2016-04-06 ENCOUNTER — Telehealth: Payer: Self-pay | Admitting: Family Medicine

## 2016-04-06 NOTE — Telephone Encounter (Signed)
Called patient with results of CXR.

## 2016-04-06 NOTE — Telephone Encounter (Signed)
Relation to [id number] Call back [phone] Pharmacy:  Reason for call:  Patient inquiring about x ray results. Please advise

## 2016-04-07 ENCOUNTER — Inpatient Hospital Stay (HOSPITAL_BASED_OUTPATIENT_CLINIC_OR_DEPARTMENT_OTHER)
Admission: EM | Admit: 2016-04-07 | Discharge: 2016-04-12 | DRG: 389 | Disposition: A | Discharge status: Home / Self Care | Payer: BLUE CROSS/BLUE SHIELD | Attending: Internal Medicine | Admitting: Internal Medicine

## 2016-04-07 ENCOUNTER — Emergency Department (HOSPITAL_BASED_OUTPATIENT_CLINIC_OR_DEPARTMENT_OTHER): Payer: BLUE CROSS/BLUE SHIELD

## 2016-04-07 ENCOUNTER — Encounter (HOSPITAL_BASED_OUTPATIENT_CLINIC_OR_DEPARTMENT_OTHER): Payer: Self-pay | Admitting: *Deleted

## 2016-04-07 DIAGNOSIS — I708 Atherosclerosis of other arteries: Secondary | ICD-10-CM | POA: Diagnosis present

## 2016-04-07 DIAGNOSIS — R109 Unspecified abdominal pain: Secondary | ICD-10-CM

## 2016-04-07 DIAGNOSIS — K56609 Unspecified intestinal obstruction, unspecified as to partial versus complete obstruction: Secondary | ICD-10-CM

## 2016-04-07 DIAGNOSIS — K565 Intestinal adhesions [bands], unspecified as to partial versus complete obstruction: Secondary | ICD-10-CM | POA: Diagnosis present

## 2016-04-07 DIAGNOSIS — N179 Acute kidney failure, unspecified: Secondary | ICD-10-CM | POA: Diagnosis present

## 2016-04-07 DIAGNOSIS — I10 Essential (primary) hypertension: Secondary | ICD-10-CM | POA: Diagnosis present

## 2016-04-07 DIAGNOSIS — E785 Hyperlipidemia, unspecified: Secondary | ICD-10-CM | POA: Diagnosis present

## 2016-04-07 DIAGNOSIS — Z9049 Acquired absence of other specified parts of digestive tract: Secondary | ICD-10-CM

## 2016-04-07 DIAGNOSIS — K5732 Diverticulitis of large intestine without perforation or abscess without bleeding: Secondary | ICD-10-CM | POA: Diagnosis present

## 2016-04-07 DIAGNOSIS — K210 Gastro-esophageal reflux disease with esophagitis: Secondary | ICD-10-CM | POA: Diagnosis present

## 2016-04-07 DIAGNOSIS — Z87891 Personal history of nicotine dependence: Secondary | ICD-10-CM

## 2016-04-07 DIAGNOSIS — R1033 Periumbilical pain: Secondary | ICD-10-CM | POA: Diagnosis not present

## 2016-04-07 DIAGNOSIS — F419 Anxiety disorder, unspecified: Secondary | ICD-10-CM | POA: Diagnosis present

## 2016-04-07 DIAGNOSIS — K589 Irritable bowel syndrome without diarrhea: Secondary | ICD-10-CM | POA: Diagnosis present

## 2016-04-07 DIAGNOSIS — M62838 Other muscle spasm: Secondary | ICD-10-CM | POA: Diagnosis present

## 2016-04-07 DIAGNOSIS — D649 Anemia, unspecified: Secondary | ICD-10-CM | POA: Diagnosis present

## 2016-04-07 DIAGNOSIS — Z7951 Long term (current) use of inhaled steroids: Secondary | ICD-10-CM

## 2016-04-07 LAB — COMPREHENSIVE METABOLIC PANEL
ALK PHOS: 63 U/L (ref 38–126)
ALT: 9 U/L — AB (ref 14–54)
ANION GAP: 9 (ref 5–15)
AST: 17 U/L (ref 15–41)
Albumin: 3.8 g/dL (ref 3.5–5.0)
BUN: 11 mg/dL (ref 6–20)
CALCIUM: 8.7 mg/dL — AB (ref 8.9–10.3)
CHLORIDE: 108 mmol/L (ref 101–111)
CO2: 24 mmol/L (ref 22–32)
CREATININE: 0.85 mg/dL (ref 0.44–1.00)
Glucose, Bld: 95 mg/dL (ref 65–99)
Potassium: 3.6 mmol/L (ref 3.5–5.1)
SODIUM: 141 mmol/L (ref 135–145)
Total Bilirubin: 0.4 mg/dL (ref 0.3–1.2)
Total Protein: 6.8 g/dL (ref 6.5–8.1)

## 2016-04-07 LAB — URINALYSIS, ROUTINE W REFLEX MICROSCOPIC
Bilirubin Urine: NEGATIVE
GLUCOSE, UA: NEGATIVE mg/dL
Hgb urine dipstick: NEGATIVE
KETONES UR: NEGATIVE mg/dL
LEUKOCYTES UA: NEGATIVE
Nitrite: NEGATIVE
PH: 5.5 (ref 5.0–8.0)
Protein, ur: NEGATIVE mg/dL
SPECIFIC GRAVITY, URINE: 1.024 (ref 1.005–1.030)

## 2016-04-07 LAB — CBC WITH DIFFERENTIAL/PLATELET
BASOS ABS: 0 10*3/uL (ref 0.0–0.1)
Basophils Relative: 1 %
EOS ABS: 0.3 10*3/uL (ref 0.0–0.7)
EOS PCT: 3 %
HCT: 40.7 % (ref 36.0–46.0)
HEMOGLOBIN: 13.3 g/dL (ref 12.0–15.0)
Lymphocytes Relative: 25 %
Lymphs Abs: 2 10*3/uL (ref 0.7–4.0)
MCH: 30.7 pg (ref 26.0–34.0)
MCHC: 32.7 g/dL (ref 30.0–36.0)
MCV: 94 fL (ref 78.0–100.0)
Monocytes Absolute: 0.7 10*3/uL (ref 0.1–1.0)
Monocytes Relative: 9 %
NEUTROS PCT: 62 %
Neutro Abs: 4.8 10*3/uL (ref 1.7–7.7)
PLATELETS: 213 10*3/uL (ref 150–400)
RBC: 4.33 MIL/uL (ref 3.87–5.11)
RDW: 14 % (ref 11.5–15.5)
WBC: 7.8 10*3/uL (ref 4.0–10.5)

## 2016-04-07 LAB — LIPASE, BLOOD: LIPASE: 23 U/L (ref 11–51)

## 2016-04-07 MED ORDER — FENTANYL CITRATE (PF) 100 MCG/2ML IJ SOLN
50.0000 ug | Freq: Once | INTRAMUSCULAR | Status: AC
  Administered 2016-04-07: 50 ug via INTRAVENOUS
  Filled 2016-04-07: qty 2

## 2016-04-07 MED ORDER — ONDANSETRON HCL 4 MG/2ML IJ SOLN
4.0000 mg | Freq: Once | INTRAMUSCULAR | Status: AC
  Administered 2016-04-07: 4 mg via INTRAVENOUS
  Filled 2016-04-07: qty 2

## 2016-04-07 NOTE — ED Triage Notes (Signed)
Pt states she is having pain around her belly button and just below it. Pain is worse when she walks and sits down. Denies urinary symptoms or fevers. Nausea and diarrhea yesterday.

## 2016-04-07 NOTE — ED Notes (Signed)
Attempted to get vitals, MD in room.

## 2016-04-07 NOTE — ED Provider Notes (Signed)
[facility] Provider Note   CSN: [id number] Arrival date & time: 04/07/16  2101  By signing my name below, I, Hansel Feinstein, attest that this documentation has been prepared under the direction and in the presence of Ripley Fraise, MD. Electronically Signed: Hansel Feinstein, ED Scribe. 04/07/16. 11:24 PM.     History   Chief Complaint Chief Complaint  Patient presents with  . Abdominal Pain    HPI Leah [name] is a 70 y.o. female with h/o appendectomy, cholecystectomy, colon resection, diverticulitis, IBS who presents to the Emergency Department complaining of moderate, worsening periumbilical abdominal pain onset yesterday with associated nausea. Pt states her symptoms began yesterday with emesis and diarrhea. She reports her abdominal pain is worsened with ambulation and slightly alleviated with rest. She rates her pain at 7/10 in severity at rest. No h/o similar pain. Pt states her current pain is not similar to IBS. She states she has not had a flare-up of diverticulitis in years. She denies CP, SOB, dysuria, back pain, cough, hematochezia, melena, syncope, numbness or paresthesia to BLE.     The history is provided by the patient. No language interpreter was used.  Abdominal Pain   This is a new problem. The current episode started yesterday. The problem occurs constantly. The problem has been gradually worsening. Associated with: movement. The pain is located in the periumbilical region. The pain is at a severity of 7/10. The pain is moderate. Associated symptoms include diarrhea, nausea and vomiting. Pertinent negatives include dysuria. The symptoms are aggravated by activity. The symptoms are relieved by being still. Past workup includes surgery. Her past medical history is significant for irritable bowel syndrome. Past medical history comments: appendectomy, cholecystectomy, bowel resection.    Past Medical History:  Diagnosis Date  . Anxiety   . Barrett esophagus     . Bladder pain   . Chronic bronchitis ([place])    per pt last episode july 2016  . Complication of anesthesia    slow to wake  . Depression   . Diverticulosis   . Frequency of urination   . GERD (gastroesophageal reflux disease)   . Hiatal hernia   . History of adenomatous polyp of colon    tubular adenoma  . History of chronic gastritis   . History of diverticulitis of colon   . History of esophageal dilatation    for stricture  . History of esophageal spasm   . Hyperlipidemia   . Hypertension   . IBS (irritable bowel syndrome)   . Interstitial cystitis   . Productive cough    intermittant  . Smokers' cough ([place])   . Urgency of urination     Patient Active Problem List   Diagnosis Date Noted  . Frozen shoulder 04/27/2015  . Hematoma 09/07/2014  . Cervical pain (neck) 05/19/2014  . Fatigue 05/08/2014  . Back pain 05/08/2014  . Pain in joint, upper arm 05/08/2014  . LLQ abdominal pain 03/02/2014  . History of diverticulitis 03/02/2014  . COPD exacerbation ([place]) 01/28/2014  . Oral thrush 01/28/2014  . Influenza 10/27/2013  . Appendicitis 09/19/2013  . Abdominal pain, left lower quadrant 03/05/2013  . HBP (high blood pressure) 02/07/2013  . Sinusitis, acute 01/15/2013  . COPD GOLD II 01/15/2013  . Smoker 12/20/2012  . Epigastric abdominal pain 09/03/2012  . Left lower quadrant pain 01/24/2011  . KNEE PAIN 06/07/2009  . ROSACEA 04/16/2009  . B12 DEFICIENCY 04/09/2009  . ANEMIA, IRON DEFICIENCY 03/19/2009  . MALAISE AND FATIGUE  03/19/2009  . NECK PAIN, LEFT 01/11/2009  . URI 07/30/2008  . Constipation 05/26/2008  . NAUSEA AND VOMITING 05/26/2008  . MUSCLE PAIN 04/27/2008  . VOMITING 04/17/2008  . Dysphagia 04/17/2008  . ANXIETY DEPRESSION 03/12/2008  . TREMOR 03/12/2008  . HYPERLIPIDEMIA 11/13/2007  . DEPRESSION 11/13/2007  . ESOPHAGEAL SPASM 11/13/2007  . GERD 11/13/2007  . IBS 11/13/2007  . CARPAL TUNNEL SYNDROME, HX OF 11/13/2007  . Diverticulosis of  colon (without mention of hemorrhage) 03/14/2007  . GASTRITIS, CHRONIC 04/10/2002  . HIATAL HERNIA 04/10/2002    Past Surgical History:  Procedure Laterality Date  . ABDOMINAL HYSTERECTOMY  1975  . ANTERIOR CERVICAL DECOMP/DISCECTOMY FUSION N/A 09/07/2014   Procedure: Exploration of Fusion and Removal of Anterior Cervical Hematoma;  Surgeon: Newman Pies, MD;  Location: [facility] NEURO ORS;  Service: Neurosurgery;  Laterality: N/A;  . ANTERIOR FUSION CERVICAL SPINE  09/03/2014   C5 -- C7  . CARPAL TUNNEL RELEASE Right 1993  . COLON SURGERY    . COLONOSCOPY  last one 04-30-2013  . CYSTO WITH HYDRODISTENSION N/A 06/08/2015   Procedure: CYSTOSCOPY/HYDRODISTENSION INSTILLATION OF MARCAINE AND PYRIDIUM;  Surgeon: Irine Seal, MD;  Location: [facility];  Service: Urology;  Laterality: N/A;  . CYSTO/  HOD/  BLADDER BX  1990's  . ESOPHAGOGASTRODUODENOSCOPY  last one 09-11-2012  . LAPAROSCOPIC APPENDECTOMY N/A 09/21/2013   Procedure: APPENDECTOMY LAPAROSCOPIC;  Surgeon: Rolm Bookbinder, MD;  Location: [facility];  Service: General;  Laterality: N/A;  . LAPAROSCOPIC CHOLECYSTECTOMY  2002  . LAPAROSCOPY SIGMOID COLECTOMY  10-05-2008   diverticulitis  . MICROLARYNGOSCOPY  09-27-2006   w/ true vocal cord stripping  and bilateral bx's of lesion's (benign)  . SINUS SURGERY WITH [place]  . [place]  . TONSILLECTOMY AND ADENOIDECTOMY  1964    OB History    No data available       Home Medications    Prior to Admission medications   Medication Sig Start Date End Date Taking? Authorizing Provider  albuterol (PROVENTIL) (2.5 MG/3ML) 0.083% nebulizer solution Inhale 3 mg into the lungs. q 4-6 hours as needed 12/01/15   Historical Provider, MD  atorvastatin (LIPITOR) 40 MG tablet Take 1 tablet (40 mg total) by mouth daily at 6 PM. 03/31/16 04/01/17  Yvonne R Lowne Chase, DO  clarithromycin (BIAXIN XL) 500 MG 24 hr tablet Take 2 tablets (1,000 mg total) by mouth  daily. 04/05/16   Percell Miller Saguier, PA-C  dicyclomine (BENTYL) 10 MG capsule Take 1 capsule by mouth three times daily as needed for cramping and spasms. 02/17/16   Nelida Meuse III, MD  doxycycline (VIBRA-TABS) 100 MG tablet Take 1 tablet (100 mg total) by mouth 2 (two) times daily. 03/31/16   Percell Miller Saguier, PA-C  doxycycline (VIBRA-TABS) 100 MG tablet Take 1 tablet (100 mg total) by mouth 2 (two) times daily. Can give generic or caps. If dupllicate rx just fill one time. Had computer glitch and wasn't sure first rx went. 03/31/16   Percell Miller Saguier, PA-C  esomeprazole (NEXIUM) 40 MG capsule Take 1 capsule (40 mg total) by mouth 2 (two) times daily before a meal. 11/20/14   Rosalita Chessman Chase, DO  estradiol (VIVELLE-DOT) 0.1 MG/24HR Place 1 patch onto the skin 2 (two) times a week.      Historical Provider, MD  ezetimibe (ZETIA) 10 MG tablet Take 1 tablet (10 mg total) by mouth daily. 03/31/16   Rosalita Chessman Chase, DO  fluticasone [facility])  50 MCG/ACT nasal spray Place 2 sprays into both nostrils daily. Patient taking differently: Place 2 sprays into both nostrils daily as needed.  04/13/15   Rosalita Chessman Chase, DO  lisinopril (PRINIVIL,ZESTRIL) 20 MG tablet Take 1 tablet (20 mg total) by mouth daily. 03/30/16   Percell Miller Saguier, PA-C  LORazepam (ATIVAN) 0.5 MG tablet Take 0.5 mg by mouth at bedtime.  12/04/14   Historical Provider, MD  mometasone-formoterol (DULERA) 200-5 MCG/ACT AERO 2 puffs bid 04/05/16   Mackie Pai, PA-C  sertraline (ZOLOFT) 50 MG tablet Take 50 mg by mouth at bedtime.    Historical Provider, MD  tiotropium (SPIRIVA HANDIHALER) 18 MCG inhalation capsule Place 1 capsule (18 mcg total) into inhaler and inhale daily. Patient taking differently: Place 18 mcg into inhaler and inhale as needed.  12/07/15   [name], DO    Family History Family History  Problem Relation Age of Onset  . Pancreatic cancer Mother   . Colon polyps Mother   . Pancreatic cancer Father   . Diabetes  Father   . Heart disease Father   . Leukemia Father   . Prostate cancer Father   . Hypertension Father   . Colon cancer Neg Hx     Social History Social History  Substance Use Topics  . Smoking status: Current Every Day Smoker    Packs/day: 1.00    Years: 47.00    Types: Cigarettes  . Smokeless tobacco: Never Used     Comment: form given 02/17/16  . Alcohol use No     Allergies   Amoxicillin-pot clavulanate; Avelox [moxifloxacin hcl in nacl]; Ciprofloxacin; Keflex [cephalexin]; Moxifloxacin; Penicillins; Sulfa antibiotics; Prednisone; and Acyclovir and related   Review of Systems Review of Systems  Respiratory: Negative for cough and shortness of breath.   Cardiovascular: Negative for chest pain.  Gastrointestinal: Positive for abdominal pain, diarrhea, nausea and vomiting. Negative for blood in stool.  Genitourinary: Negative for dysuria.  Musculoskeletal: Negative for back pain.  Neurological: Negative for syncope and numbness.  All other systems reviewed and are negative.   Physical Exam Updated Vital Signs BP 123/72 (BP Location: Left Arm)   Pulse 84   Temp 97.9 F (36.6 C) (Oral)   Resp 16   Ht 5\' 1"  (1.549 m)   Wt 114 lb (51.7 kg)   SpO2 97%   BMI 21.54 kg/m   Physical Exam CONSTITUTIONAL: Well developed/well nourished HEAD: Normocephalic/atraumatic EYES: EOMI ENMT: Mucous membranes moist NECK: supple no meningeal signs SPINE/BACK:entire spine nontender CV: S1/S2 noted, no murmurs/rubs/gallops noted LUNGS: Lungs are clear to auscultation bilaterally, no apparent distress ABDOMEN: soft, moderate right lower/suprapubic tenderness, no rebound or guarding, bowel sounds noted throughout abdomen GU:no cva tenderness NEURO: Pt is awake/alert/appropriate, moves all extremitiesx4.  No facial droop.   EXTREMITIES: pulses normal/equal, full ROM SKIN: warm, color normal PSYCH: no abnormalities of mood noted, alert and oriented to situation   ED Treatments /  Results   DIAGNOSTIC STUDIES: Oxygen Saturation is 97% on RA, normal by my interpretation.    COORDINATION OF CARE: 11:13 PM Discussed treatment plan with pt at bedside which includes lab work, CT A/P and pt agreed to plan.    Labs (all labs ordered are listed, but only abnormal results are displayed) Labs Reviewed  URINALYSIS, ROUTINE W REFLEX MICROSCOPIC (NOT AT [facility]) - Abnormal; Notable for the following:       Result Value   APPearance CLOUDY (*)    All other components within normal  limits  COMPREHENSIVE METABOLIC PANEL - Abnormal; Notable for the following:    Calcium 8.7 (*)    ALT 9 (*)    All other components within normal limits  LIPASE, BLOOD  CBC WITH DIFFERENTIAL/PLATELET    EKG  EKG Interpretation None       Radiology Ct Abdomen Pelvis W Contrast  Result Date: 04/08/2016 CLINICAL DATA:  70 year old female with abdominal pain and nausea. EXAM: CT ABDOMEN AND PELVIS WITH CONTRAST TECHNIQUE: Multidetector CT imaging of the abdomen and pelvis was performed using the standard protocol following bolus administration of intravenous contrast. CONTRAST:  187mL ISOVUE-300 IOPAMIDOL (ISOVUE-300) INJECTION 61% COMPARISON:  CT dated 03/02/2014 FINDINGS: Mild emphysematous changes of the right lower lobe. The visualized lung bases are otherwise clear. There is coronary vascular calcification. No intra-abdominal free air. Small free fluid within the pelvis. Small posterior diaphragmatic defects with herniation of the mesenteric fat, right greater left compatible with a Bochdalek's hernia. Stable appearing 18 mm ovoid hypodense lesion in the right lobe of the liver centrally, likely a cyst. Scattered smaller hepatic hypodense lesions are not well characterized but most likely represent cysts or hemangioma. Cholecystectomy. The pancreas, spleen, adrenal glands, kidneys, visualized ureters, and urinary bladder appear unremarkable. Hysterectomy. Oral contrast opacifies the stomach and  multiple loops of small bowel. Multiple fluid-filled loop of bowel with mild associated inflammation noted in the pelvis. These loops of bowel measure up to 2.5 cm in diameter. There is a point of transition in the posterior pelvis (series 2, image 70) concerning for focal area of adhesion with possible low grade or early small bowel obstruction. The distal small bowel loops and terminal ileum are collapsed. There is postsurgical changes of partial sigmoid resection with anastomotic suture. Appendectomy. There is advanced aortoiliac atherosclerotic disease. The origins of the celiac axis, SMA, IMA as well as the origins of the renal arteries appear patent. No portal venous gas identified. There is no adenopathy. The abdominal wall soft tissues appear unremarkable. Midline vertical anterior pelvic wall incisional scar noted. There is osteopenia with degenerative changes of the spine. No acute fracture. IMPRESSION: Findings concerning for a low grade or early distal small bowel obstruction with transition zone in the posterior pelvis likely related to underlying adhesion. Clinical correlation and follow-up recommended. Electronically Signed   By: Anner Crete M.D.   On: 04/08/2016 02:14    Procedures Procedures  Medications Ordered in ED Medications - No data to display   Initial Impression / Assessment and Plan / ED Course  I have reviewed the triage vital signs and the nursing notes.  Pertinent labs & imaging results that were available during my care of the patient were reviewed by me and considered in my medical decision making (see chart for details).  Clinical Course    Pt with worsening abd pain Found to have early SBO on CT imaging Overall, pt stable, not septic appearing, does not have surgical abdomen at this time Will admit D/w dr gardner at [facility] for admission to medicine Patient updated on plan  Final Clinical Impressions(s) / ED Diagnoses   Final diagnoses:  Small bowel  obstruction ([place])    New Prescriptions New Prescriptions   No medications on file    I personally performed the services described in this documentation, which was scribed in my presence. The recorded information has been reviewed and is accurate.        Ripley Fraise, MD 04/08/16 [phone]

## 2016-04-08 ENCOUNTER — Encounter (HOSPITAL_COMMUNITY): Payer: Self-pay | Admitting: *Deleted

## 2016-04-08 DIAGNOSIS — I708 Atherosclerosis of other arteries: Secondary | ICD-10-CM | POA: Diagnosis present

## 2016-04-08 DIAGNOSIS — K565 Intestinal adhesions [bands], unspecified as to partial versus complete obstruction: Secondary | ICD-10-CM | POA: Diagnosis present

## 2016-04-08 DIAGNOSIS — K210 Gastro-esophageal reflux disease with esophagitis: Secondary | ICD-10-CM | POA: Diagnosis present

## 2016-04-08 DIAGNOSIS — Z87891 Personal history of nicotine dependence: Secondary | ICD-10-CM | POA: Diagnosis not present

## 2016-04-08 DIAGNOSIS — F419 Anxiety disorder, unspecified: Secondary | ICD-10-CM | POA: Diagnosis present

## 2016-04-08 DIAGNOSIS — D649 Anemia, unspecified: Secondary | ICD-10-CM | POA: Diagnosis present

## 2016-04-08 DIAGNOSIS — Z7951 Long term (current) use of inhaled steroids: Secondary | ICD-10-CM | POA: Diagnosis not present

## 2016-04-08 DIAGNOSIS — R1033 Periumbilical pain: Secondary | ICD-10-CM | POA: Diagnosis present

## 2016-04-08 DIAGNOSIS — K5732 Diverticulitis of large intestine without perforation or abscess without bleeding: Secondary | ICD-10-CM | POA: Diagnosis present

## 2016-04-08 DIAGNOSIS — N179 Acute kidney failure, unspecified: Secondary | ICD-10-CM | POA: Diagnosis present

## 2016-04-08 DIAGNOSIS — J449 Chronic obstructive pulmonary disease, unspecified: Secondary | ICD-10-CM | POA: Diagnosis not present

## 2016-04-08 DIAGNOSIS — E785 Hyperlipidemia, unspecified: Secondary | ICD-10-CM | POA: Diagnosis present

## 2016-04-08 DIAGNOSIS — I10 Essential (primary) hypertension: Secondary | ICD-10-CM | POA: Diagnosis present

## 2016-04-08 DIAGNOSIS — M79609 Pain in unspecified limb: Secondary | ICD-10-CM | POA: Diagnosis not present

## 2016-04-08 DIAGNOSIS — Z9049 Acquired absence of other specified parts of digestive tract: Secondary | ICD-10-CM | POA: Diagnosis not present

## 2016-04-08 DIAGNOSIS — M62838 Other muscle spasm: Secondary | ICD-10-CM | POA: Diagnosis present

## 2016-04-08 DIAGNOSIS — K589 Irritable bowel syndrome without diarrhea: Secondary | ICD-10-CM | POA: Diagnosis present

## 2016-04-08 LAB — BASIC METABOLIC PANEL
ANION GAP: 5 (ref 5–15)
BUN: 8 mg/dL (ref 6–20)
CO2: 28 mmol/L (ref 22–32)
Calcium: 7.7 mg/dL — ABNORMAL LOW (ref 8.9–10.3)
Chloride: 107 mmol/L (ref 101–111)
Creatinine, Ser: 0.69 mg/dL (ref 0.44–1.00)
GFR calc Af Amer: >60 mL/min (ref >60)
GFR calc non Af Amer: >60 mL/min (ref >60)
GLUCOSE: 93 mg/dL (ref 65–99)
POTASSIUM: 3.6 mmol/L (ref 3.5–5.1)
Sodium: 140 mmol/L (ref 135–145)

## 2016-04-08 LAB — CBC
HEMATOCRIT: 34.8 % — AB (ref 36.0–46.0)
Hemoglobin: 11 g/dL — ABNORMAL LOW (ref 12.0–15.0)
MCH: 29.5 pg (ref 26.0–34.0)
MCHC: 31.6 g/dL (ref 30.0–36.0)
MCV: 93.3 fL (ref 78.0–100.0)
Platelets: 195 10*3/uL (ref 150–400)
RBC: 3.73 MIL/uL — AB (ref 3.87–5.11)
RDW: 14.2 % (ref 11.5–15.5)
WBC: 5.8 10*3/uL (ref 4.0–10.5)

## 2016-04-08 MED ORDER — MOMETASONE FURO-FORMOTEROL FUM 200-5 MCG/ACT IN AERO
2.0000 | INHALATION_SPRAY | Freq: Two times a day (BID) | RESPIRATORY_TRACT | Status: DC
  Administered 2016-04-08 – 2016-04-12 (×5): 2 via RESPIRATORY_TRACT
  Filled 2016-04-08: qty 8.8

## 2016-04-08 MED ORDER — ONDANSETRON HCL 4 MG/2ML IJ SOLN: 4.0000 mg | Freq: Four times a day (QID) | INTRAMUSCULAR | Status: DC | PRN

## 2016-04-08 MED ORDER — FLUTICASONE PROPIONATE 50 MCG/ACT NA SUSP: 2.0000 | Freq: Every day | NASAL | Status: DC | PRN

## 2016-04-08 MED ORDER — ACETAMINOPHEN 325 MG PO TABS
650.0000 mg | ORAL_TABLET | ORAL | Status: DC | PRN
  Administered 2016-04-08 – 2016-04-12 (×3): 650 mg via ORAL
  Filled 2016-04-08 (×3): qty 2

## 2016-04-08 MED ORDER — FAMOTIDINE IN NACL 20-0.9 MG/50ML-% IV SOLN
20.0000 mg | Freq: Two times a day (BID) | INTRAVENOUS | Status: DC
  Administered 2016-04-08 – 2016-04-11 (×7): 20 mg via INTRAVENOUS
  Filled 2016-04-08 (×9): qty 50

## 2016-04-08 MED ORDER — TIOTROPIUM BROMIDE MONOHYDRATE 18 MCG IN CAPS
18.0000 ug | ORAL_CAPSULE | Freq: Every day | RESPIRATORY_TRACT | Status: DC
  Administered 2016-04-10 – 2016-04-12 (×2): 18 ug via RESPIRATORY_TRACT
  Filled 2016-04-08: qty 5

## 2016-04-08 MED ORDER — ALBUTEROL SULFATE (2.5 MG/3ML) 0.083% IN NEBU: 3.0000 mg | INHALATION_SOLUTION | RESPIRATORY_TRACT | Status: DC | PRN

## 2016-04-08 MED ORDER — PANTOPRAZOLE SODIUM 40 MG PO TBEC: 40.0000 mg | DELAYED_RELEASE_TABLET | Freq: Every day | ORAL | Status: DC

## 2016-04-08 MED ORDER — SERTRALINE HCL 50 MG PO TABS
50.0000 mg | ORAL_TABLET | Freq: Every day | ORAL | Status: DC
Start: 2016-04-08 — End: 2016-04-08

## 2016-04-08 MED ORDER — ENOXAPARIN SODIUM 40 MG/0.4ML ~~LOC~~ SOLN
40.0000 mg | SUBCUTANEOUS | Status: DC
  Administered 2016-04-08 – 2016-04-12 (×5): 40 mg via SUBCUTANEOUS
  Filled 2016-04-08 (×5): qty 0.4

## 2016-04-08 MED ORDER — IOPAMIDOL (ISOVUE-300) INJECTION 61%
100.0000 mL | Freq: Once | INTRAVENOUS | Status: AC | PRN
  Administered 2016-04-08: 100 mL via INTRAVENOUS

## 2016-04-08 MED ORDER — MORPHINE SULFATE (PF) 2 MG/ML IV SOLN
2.0000 mg | INTRAVENOUS | Status: DC | PRN
  Administered 2016-04-08 – 2016-04-09 (×4): 2 mg via INTRAVENOUS
  Administered 2016-04-09: 4 mg via INTRAVENOUS
  Administered 2016-04-09: 2 mg via INTRAVENOUS
  Administered 2016-04-10: 4 mg via INTRAVENOUS
  Administered 2016-04-10: 2 mg via INTRAVENOUS
  Administered 2016-04-10: 4 mg via INTRAVENOUS
  Administered 2016-04-11 (×2): 2 mg via INTRAVENOUS
  Administered 2016-04-11: 4 mg via INTRAVENOUS
  Administered 2016-04-12: 2 mg via INTRAVENOUS
  Filled 2016-04-08: qty 1
  Filled 2016-04-08: qty 2
  Filled 2016-04-08: qty 1
  Filled 2016-04-08: qty 2
  Filled 2016-04-08: qty 1
  Filled 2016-04-08: qty 2
  Filled 2016-04-08 (×4): qty 1
  Filled 2016-04-08 (×2): qty 2
  Filled 2016-04-08 (×2): qty 1

## 2016-04-08 MED ORDER — ESTRADIOL 0.1 MG/24HR TD PTWK
0.1000 mg | MEDICATED_PATCH | TRANSDERMAL | Status: DC
  Administered 2016-04-10: 0.1 mg via TRANSDERMAL
  Filled 2016-04-08: qty 1

## 2016-04-08 MED ORDER — LISINOPRIL 20 MG PO TABS
20.0000 mg | ORAL_TABLET | Freq: Every day | ORAL | Status: DC
Start: 2016-04-08 — End: 2016-04-08
  Administered 2016-04-08: 20 mg via ORAL
  Filled 2016-04-08: qty 1

## 2016-04-08 MED ORDER — SODIUM CHLORIDE 0.9 % IV SOLN
INTRAVENOUS | Status: DC
  Administered 2016-04-08 – 2016-04-11 (×6): via INTRAVENOUS

## 2016-04-08 MED ORDER — EZETIMIBE 10 MG PO TABS
10.0000 mg | ORAL_TABLET | Freq: Every day | ORAL | Status: DC
  Administered 2016-04-08: 10 mg via ORAL
  Filled 2016-04-08: qty 1

## 2016-04-08 MED ORDER — IBUPROFEN 400 MG PO TABS
400.0000 mg | ORAL_TABLET | Freq: Once | ORAL | Status: AC
  Administered 2016-04-08: 400 mg via ORAL
  Filled 2016-04-08: qty 1

## 2016-04-08 MED ORDER — ATORVASTATIN CALCIUM 40 MG PO TABS
40.0000 mg | ORAL_TABLET | Freq: Every day | ORAL | Status: DC
Start: 2016-04-08 — End: 2016-04-08

## 2016-04-08 MED ORDER — ONDANSETRON HCL 4 MG/2ML IJ SOLN
4.0000 mg | Freq: Once | INTRAMUSCULAR | Status: AC
  Administered 2016-04-08: 4 mg via INTRAVENOUS
  Filled 2016-04-08: qty 2

## 2016-04-08 MED ORDER — SODIUM CHLORIDE 0.9 % IV BOLUS (SEPSIS)
1000.0000 mL | Freq: Once | INTRAVENOUS | Status: AC
  Administered 2016-04-08: 1000 mL via INTRAVENOUS

## 2016-04-08 MED ORDER — DICYCLOMINE HCL 10 MG PO CAPS
10.0000 mg | ORAL_CAPSULE | Freq: Three times a day (TID) | ORAL | Status: DC | PRN
  Administered 2016-04-09: 10 mg via ORAL
  Filled 2016-04-08: qty 1

## 2016-04-08 MED ORDER — FENTANYL CITRATE (PF) 100 MCG/2ML IJ SOLN
50.0000 ug | Freq: Once | INTRAMUSCULAR | Status: AC
  Administered 2016-04-08: 50 ug via INTRAVENOUS
  Filled 2016-04-08: qty 2

## 2016-04-08 MED ORDER — LORAZEPAM 2 MG/ML IJ SOLN
0.5000 mg | Freq: Once | INTRAMUSCULAR | Status: AC
  Administered 2016-04-08: 0.5 mg via INTRAVENOUS
  Filled 2016-04-08: qty 1

## 2016-04-08 MED ORDER — LORAZEPAM 0.5 MG PO TABS
0.5000 mg | ORAL_TABLET | Freq: Every day | ORAL | Status: DC
  Administered 2016-04-08 – 2016-04-11 (×4): 0.5 mg via ORAL
  Filled 2016-04-08 (×4): qty 1

## 2016-04-08 NOTE — Plan of Care (Addendum)
PSBO or early SBO due to adhesions according to CT scan.  No NGT yet, transferring to med surg.

## 2016-04-08 NOTE — ED Notes (Signed)
Pt placed on oxygen 2L via [place] due to oxygen sat of 92% after last dose of pain medication. Improvement in oxygen sat to 96%

## 2016-04-08 NOTE — H&P (Signed)
History and Physical    Leah [name] [id number] DOB: 04/07/1946 DOA: 04/07/2016   PCP: [name], DO Chief Complaint:  Chief Complaint  Patient presents with  . Abdominal Pain    HPI: Leah [name] is a 70 y.o. female with medical history significant of appendectomy, cholecystectomy, hysterectomy, diverticulitis and sigmoid colon resection.  Patient presents to the ED at [facility] with c/o moderate, worsening, periumbilical abdominal pain.  Symptoms onset yesterday, associated nausea.  Today she began to have vomiting and some diarrhea.  Pain is worse with ambulation and slightly better at rest.  7/10 in severity.  No h/o similar pain, no h/o prior SBO in the past, hasnt had diverticulitis flare up in years (thanks in large part to sigmoid resection).  No melena, hematochezia, hematemesis.  ED Course: CT shows PSBO or early SBO likely due to adhesions.  Review of Systems: As per HPI otherwise 10 point review of systems negative.    Past Medical History:  Diagnosis Date  . Anxiety   . Barrett esophagus   . Bladder pain   . Chronic bronchitis ([place])    per pt last episode july 2016  . Complication of anesthesia    slow to wake  . Depression   . Diverticulosis   . Frequency of urination   . GERD (gastroesophageal reflux disease)   . Hiatal hernia   . History of adenomatous polyp of colon    tubular adenoma  . History of chronic gastritis   . History of diverticulitis of colon   . History of esophageal dilatation    for stricture  . History of esophageal spasm   . Hyperlipidemia   . Hypertension   . IBS (irritable bowel syndrome)   . Interstitial cystitis   . Productive cough    intermittant  . Smokers' cough ([place])   . Urgency of urination     Past Surgical History:  Procedure Laterality Date  . ABDOMINAL HYSTERECTOMY  1975  . ANTERIOR CERVICAL DECOMP/DISCECTOMY FUSION N/A 09/07/2014   Procedure: Exploration of Fusion and Removal of Anterior Cervical  Hematoma;  Surgeon: Newman Pies, MD;  Location: [facility] NEURO ORS;  Service: Neurosurgery;  Laterality: N/A;  . ANTERIOR FUSION CERVICAL SPINE  09/03/2014   C5 -- C7  . APPENDECTOMY    . CARPAL TUNNEL RELEASE Right 1993  . COLON SURGERY    . COLONOSCOPY  last one 04-30-2013  . CYSTO WITH HYDRODISTENSION N/A 06/08/2015   Procedure: CYSTOSCOPY/HYDRODISTENSION INSTILLATION OF MARCAINE AND PYRIDIUM;  Surgeon: Irine Seal, MD;  Location: [facility];  Service: Urology;  Laterality: N/A;  . CYSTO/  HOD/  BLADDER BX  1990's  . ESOPHAGOGASTRODUODENOSCOPY  last one 09-11-2012  . LAPAROSCOPIC APPENDECTOMY N/A 09/21/2013   Procedure: APPENDECTOMY LAPAROSCOPIC;  Surgeon: Rolm Bookbinder, MD;  Location: [facility];  Service: General;  Laterality: N/A;  . LAPAROSCOPIC CHOLECYSTECTOMY  2002  . LAPAROSCOPY SIGMOID COLECTOMY  10-05-2008   diverticulitis  . MICROLARYNGOSCOPY  09-27-2006   w/ true vocal cord stripping  and bilateral bx's of lesion's (benign)  . SINUS SURGERY WITH [place]  . [place]  . [place]     reports that she has been smoking Cigarettes.  She has a 47.00 pack-year smoking history. She has never used smokeless tobacco. She reports that she does not drink alcohol or use drugs.  Allergies  Allergen Reactions  . Amoxicillin-Pot Clavulanate Shortness Of Breath, Itching and Swelling  .  Avelox [Moxifloxacin Hcl In Nacl] Swelling    Swelling, itching and shortness of breath  . Ciprofloxacin Shortness Of Breath, Itching and Swelling  . Keflex [Cephalexin] Shortness Of Breath and Swelling  . Moxifloxacin Shortness Of Breath, Itching and Swelling  . Penicillins Shortness Of Breath, Itching and Swelling  . Sulfa Antibiotics Shortness Of Breath, Itching and Swelling  . Prednisone Other (See Comments)    GI irritation; is able to tolerate injections  . Acyclovir And Related Other (See Comments)    unknown     Family History  Problem Relation Age of Onset  . Pancreatic cancer Mother   . Colon polyps Mother   . Pancreatic cancer Father   . Diabetes Father   . Heart disease Father   . Leukemia Father   . Prostate cancer Father   . Hypertension Father   . Colon cancer Neg Hx       Prior to Admission medications   Medication Sig Start Date End Date Taking? Authorizing Provider  albuterol (PROVENTIL) (2.5 MG/3ML) 0.083% nebulizer solution Inhale 3 mg into the lungs. q 4-6 hours as needed 12/01/15   Historical Provider, MD  atorvastatin (LIPITOR) 40 MG tablet Take 1 tablet (40 mg total) by mouth daily at 6 PM. 03/31/16 04/01/17  Yvonne R Lowne Chase, DO  clarithromycin (BIAXIN XL) 500 MG 24 hr tablet Take 2 tablets (1,000 mg total) by mouth daily. 04/05/16   Percell Miller Saguier, PA-C  dicyclomine (BENTYL) 10 MG capsule Take 1 capsule by mouth three times daily as needed for cramping and spasms. 02/17/16   Nelida Meuse III, MD  doxycycline (VIBRA-TABS) 100 MG tablet Take 1 tablet (100 mg total) by mouth 2 (two) times daily. 03/31/16   Percell Miller Saguier, PA-C  doxycycline (VIBRA-TABS) 100 MG tablet Take 1 tablet (100 mg total) by mouth 2 (two) times daily. Can give generic or caps. If dupllicate rx just fill one time. Had computer glitch and wasn't sure first rx went. 03/31/16   Percell Miller Saguier, PA-C  esomeprazole (NEXIUM) 40 MG capsule Take 1 capsule (40 mg total) by mouth 2 (two) times daily before a meal. 11/20/14   Rosalita Chessman Chase, DO  estradiol (VIVELLE-DOT) 0.1 MG/24HR Place 1 patch onto the skin 2 (two) times a week.      Historical Provider, MD  ezetimibe (ZETIA) 10 MG tablet Take 1 tablet (10 mg total) by mouth daily. 03/31/16   Alferd Apa Lowne Chase, DO  fluticasone (FLONASE) 50 MCG/ACT nasal spray Place 2 sprays into both nostrils daily. Patient taking differently: Place 2 sprays into both nostrils daily as needed.  04/13/15   Rosalita Chessman Chase, DO  lisinopril (PRINIVIL,ZESTRIL) 20 MG tablet Take 1  tablet (20 mg total) by mouth daily. 03/30/16   Percell Miller Saguier, PA-C  LORazepam (ATIVAN) 0.5 MG tablet Take 0.5 mg by mouth at bedtime.  12/04/14   Historical Provider, MD  mometasone-formoterol (DULERA) 200-5 MCG/ACT AERO 2 puffs bid 04/05/16   Mackie Pai, PA-C  sertraline (ZOLOFT) 50 MG tablet Take 50 mg by mouth at bedtime.    Historical Provider, MD  tiotropium (SPIRIVA HANDIHALER) 18 MCG inhalation capsule Place 1 capsule (18 mcg total) into inhaler and inhale daily. Patient taking differently: Place 18 mcg into inhaler and inhale as needed.  12/07/15   [name], DO    Physical Exam: Vitals:   04/07/16 2317 04/08/16 0235 04/08/16 0318 04/08/16 0413  BP: 144/62 126/66 120/64 139/64  Pulse: 60 68 63 (!)  58  Resp: 18 17 16 16   Temp:    97.7 F (36.5 C)  TempSrc:    Oral  SpO2: 96% 94% 92% 98%  Weight:    51.7 kg (113 lb 15.7 oz)  Height:    5\' 1"  (1.549 m)      Constitutional: NAD, calm, comfortable Eyes: PERRL, lids and conjunctivae normal ENMT: Mucous membranes are moist. Posterior pharynx clear of any exudate or lesions.Normal dentition.  Neck: normal, supple, no masses, no thyromegaly Respiratory: clear to auscultation bilaterally, no wheezing, no crackles. Normal respiratory effort. No accessory muscle use.  Cardiovascular: Regular rate and rhythm, no murmurs / rubs / gallops. No extremity edema. 2+ pedal pulses. No carotid bruits.  Abdomen: no tenderness, no masses palpated. No hepatosplenomegaly. Bowel sounds positive.  Musculoskeletal: no clubbing / cyanosis. No joint deformity upper and lower extremities. Good ROM, no contractures. Normal muscle tone.  Skin: no rashes, lesions, ulcers. No induration Neurologic: CN 2-12 grossly intact. Sensation intact, DTR normal. Strength 5/5 in all 4.  Psychiatric: Normal judgment and insight. Alert and oriented x 3. Normal mood.    Labs on Admission: I have personally reviewed following labs and imaging  studies  CBC:  Recent Labs Lab 04/07/16 2200  WBC 7.8  NEUTROABS 4.8  HGB 13.3  HCT 40.7  MCV 94.0  PLT 123456   Basic Metabolic Panel:  Recent Labs Lab 04/07/16 2200  NA 141  K 3.6  CL 108  CO2 24  GLUCOSE 95  BUN 11  CREATININE 0.85  CALCIUM 8.7*   GFR: Estimated Creatinine Clearance: 48.5 mL/min (by C-G formula based on SCr of 0.85 mg/dL). Liver Function Tests:  Recent Labs Lab 04/07/16 2200  AST 17  ALT 9*  ALKPHOS 63  BILITOT 0.4  PROT 6.8  ALBUMIN 3.8    Recent Labs Lab 04/07/16 2200  LIPASE 23   No results for input(s): AMMONIA in the last 168 hours. Coagulation Profile: No results for input(s): INR, PROTIME in the last 168 hours. Cardiac Enzymes: No results for input(s): CKTOTAL, CKMB, CKMBINDEX, TROPONINI in the last 168 hours. BNP (last 3 results) No results for input(s): PROBNP in the last 8760 hours. HbA1C: No results for input(s): HGBA1C in the last 72 hours. CBG: No results for input(s): GLUCAP in the last 168 hours. Lipid Profile: No results for input(s): CHOL, HDL, LDLCALC, TRIG, CHOLHDL, LDLDIRECT in the last 72 hours. Thyroid Function Tests: No results for input(s): TSH, T4TOTAL, FREET4, T3FREE, THYROIDAB in the last 72 hours. Anemia Panel: No results for input(s): VITAMINB12, FOLATE, FERRITIN, TIBC, IRON, RETICCTPCT in the last 72 hours. Urine analysis:    Component Value Date/Time   COLORURINE YELLOW 04/07/2016 2109   APPEARANCEUR CLOUDY (A) 04/07/2016 2109   LABSPEC 1.024 04/07/2016 2109   PHURINE 5.5 04/07/2016 2109   GLUCOSEU NEGATIVE 04/07/2016 2109   HGBUR NEGATIVE 04/07/2016 2109   BILIRUBINUR NEGATIVE 04/07/2016 2109   BILIRUBINUR neg 03/30/2016 1110   KETONESUR NEGATIVE 04/07/2016 2109   PROTEINUR NEGATIVE 04/07/2016 2109   UROBILINOGEN 0.2 03/30/2016 1110   UROBILINOGEN 0.2 09/19/2013 1627   NITRITE NEGATIVE 04/07/2016 2109   LEUKOCYTESUR NEGATIVE 04/07/2016 2109   Sepsis  Labs: @LABRCNTIP (procalcitonin:4,lacticidven:4) )No results found for this or any previous visit (from the past 240 hour(s)).   Radiological Exams on Admission: Ct Abdomen Pelvis W Contrast  Result Date: 04/08/2016 CLINICAL DATA:  70 year old female with abdominal pain and nausea. EXAM: CT ABDOMEN AND PELVIS WITH CONTRAST TECHNIQUE: Multidetector CT imaging of the abdomen  and pelvis was performed using the standard protocol following bolus administration of intravenous contrast. CONTRAST:  163mL ISOVUE-300 IOPAMIDOL (ISOVUE-300) INJECTION 61% COMPARISON:  CT dated 03/02/2014 FINDINGS: Mild emphysematous changes of the right lower lobe. The visualized lung bases are otherwise clear. There is coronary vascular calcification. No intra-abdominal free air. Small free fluid within the pelvis. Small posterior diaphragmatic defects with herniation of the mesenteric fat, right greater left compatible with a Bochdalek's hernia. Stable appearing 18 mm ovoid hypodense lesion in the right lobe of the liver centrally, likely a cyst. Scattered smaller hepatic hypodense lesions are not well characterized but most likely represent cysts or hemangioma. Cholecystectomy. The pancreas, spleen, adrenal glands, kidneys, visualized ureters, and urinary bladder appear unremarkable. Hysterectomy. Oral contrast opacifies the stomach and multiple loops of small bowel. Multiple fluid-filled loop of bowel with mild associated inflammation noted in the pelvis. These loops of bowel measure up to 2.5 cm in diameter. There is a point of transition in the posterior pelvis (series 2, image 70) concerning for focal area of adhesion with possible low grade or early small bowel obstruction. The distal small bowel loops and terminal ileum are collapsed. There is postsurgical changes of partial sigmoid resection with anastomotic suture. Appendectomy. There is advanced aortoiliac atherosclerotic disease. The origins of the celiac axis, SMA, IMA as  well as the origins of the renal arteries appear patent. No portal venous gas identified. There is no adenopathy. The abdominal wall soft tissues appear unremarkable. Midline vertical anterior pelvic wall incisional scar noted. There is osteopenia with degenerative changes of the spine. No acute fracture. IMPRESSION: Findings concerning for a low grade or early distal small bowel obstruction with transition zone in the posterior pelvis likely related to underlying adhesion. Clinical correlation and follow-up recommended. Electronically Signed   By: Anner Crete M.D.   On: 04/08/2016 02:14    EKG: Independently reviewed.  Assessment/Plan Principal Problem:   Small bowel obstruction due to adhesions ([place])    1. SBO due to adhesions - 1. Given that this is first episode ever, very likely due to adhesions given history and CT findings, good chance that this may resolve with conservative (non-operatively) management. 2. NPO 3. IVF 4. No vomiting since she had PO contrast for CT scan so will hold off on NGT for the moment, if she develops worsening vomiting or this does not resolve in 24 hours will need NGT placed. 5. May wish to consult surgery in AM, will hold off for the moment given plans for initial non-surgical management as noted above. 6. Morphine PRN Pain, zofran PRN nausea   DVT prophylaxis: Lovenox Code Status: Full Family Communication: Husband at bedside Consults called: None Admission status: Admit to inpatient   Etta Quill DO [facility] Pager [phone] from 7PM-7AM  If 7AM-7PM, please contact the day physician for the patient [email or web address] Password [email or web address]  04/08/2016, 4:51 AM

## 2016-04-08 NOTE — ED Notes (Signed)
[facility] here for transport at this time.  

## 2016-04-08 NOTE — Progress Notes (Signed)
[facility] PROGRESS NOTE  Leah [name] [id number] DOB: 11-Aug-1945 DOA: 04/07/2016  PCP: [name], DO  Brief HPI: 70 year old Caucasian female with a past medical history of multiple abdominal surgeries including appendectomy, cholecystectomy, hysterectomy, surgery for diverticulitis in the form of sigmoid colon resection, who presented with complaints of abdominal pain. She was found to have early small bowel obstruction, likely due to adhesions. She was hospitalized for further management.  Past medical history:  Past Medical History:  Diagnosis Date  . Anxiety   . Barrett esophagus   . Bladder pain   . Chronic bronchitis ([place])    per pt last episode july 2016  . Complication of anesthesia    slow to wake  . Depression   . Diverticulosis   . Frequency of urination   . GERD (gastroesophageal reflux disease)   . Hiatal hernia   . History of adenomatous polyp of colon    tubular adenoma  . History of chronic gastritis   . History of diverticulitis of colon   . History of esophageal dilatation    for stricture  . History of esophageal spasm   . Hyperlipidemia   . Hypertension   . IBS (irritable bowel syndrome)   . Interstitial cystitis   . Productive cough    intermittant  . Smokers' cough ([place])   . Urgency of urination     Consultants: None  Procedures: None yet  Antibiotics: None  Subjective: Patient feels slightly better this morning. Abdominal pain persists. Denies any nausea or vomiting. Is not passing any gas as yet. Husband is at the bedside.  Objective:  Vital Signs  Vitals:   04/07/16 2317 04/08/16 0235 04/08/16 0318 04/08/16 0413  BP: 144/62 126/66 120/64 139/64  Pulse: 60 68 63 (!) 58  Resp: 18 17 16 16   Temp:    97.7 F (36.5 C)  TempSrc:    Oral  SpO2: 96% 94% 92% 98%  Weight:    51.7 kg (113 lb 15.7 oz)  Height:    5\' 1"  (1.549 m)    Intake/Output Summary (Last 24 hours) at 04/08/16 1145 Last data filed at  04/08/16 0853  Gross per 24 hour  Intake                0 ml  Output              300 ml  Net             -300 ml   Filed Weights   04/07/16 2106 04/08/16 0413  Weight: 51.7 kg (114 lb) 51.7 kg (113 lb 15.7 oz)    General appearance: alert, cooperative, appears stated age and no distress Resp: clear to auscultation bilaterally Cardio: regular rate and rhythm, S1, S2 normal, no murmur, click, rub or gallop GI: Abdomen is soft. No significant distention is noted. Bowel sounds are present and appear to be normal. Abdomen is tender in the left lower quadrant without any rebound, rigidity or guarding. No masses or organomegaly. Extremities: extremities normal, atraumatic, no cyanosis or edema Neurologic: No focal deficits  Lab Results:  Data Reviewed: I have personally reviewed following labs and imaging studies  CBC:  Recent Labs Lab 04/07/16 2200 04/08/16 0737  WBC 7.8 5.8  NEUTROABS 4.8  --   HGB 13.3 11.0*  HCT 40.7 34.8*  MCV 94.0 93.3  PLT 213 0000000   Basic Metabolic Panel:  Recent Labs Lab 04/07/16 2200 04/08/16 0737  NA 141 140  K 3.6 3.6  CL 108 107  CO2 24 28  GLUCOSE 95 93  BUN 11 8  CREATININE 0.85 0.69  CALCIUM 8.7* 7.7*   GFR: Estimated Creatinine Clearance: 51.5 mL/min (by C-G formula based on SCr of 0.8 mg/dL). Liver Function Tests:  Recent Labs Lab 04/07/16 2200  AST 17  ALT 9*  ALKPHOS 63  BILITOT 0.4  PROT 6.8  ALBUMIN 3.8    Recent Labs Lab 04/07/16 2200  LIPASE 23   Urine analysis:    Component Value Date/Time   COLORURINE YELLOW 04/07/2016 2109   APPEARANCEUR CLOUDY (A) 04/07/2016 2109   LABSPEC 1.024 04/07/2016 2109   PHURINE 5.5 04/07/2016 2109   GLUCOSEU NEGATIVE 04/07/2016 2109   HGBUR NEGATIVE 04/07/2016 2109   BILIRUBINUR NEGATIVE 04/07/2016 2109   BILIRUBINUR neg 03/30/2016 1110   KETONESUR NEGATIVE 04/07/2016 2109   PROTEINUR NEGATIVE 04/07/2016 2109   UROBILINOGEN 0.2 03/30/2016 1110   UROBILINOGEN 0.2  09/19/2013 1627   NITRITE NEGATIVE 04/07/2016 2109   LEUKOCYTESUR NEGATIVE 04/07/2016 2109     Radiology Studies: Ct Abdomen Pelvis W Contrast  Result Date: 04/08/2016 CLINICAL DATA:  70 year old female with abdominal pain and nausea. EXAM: CT ABDOMEN AND PELVIS WITH CONTRAST TECHNIQUE: Multidetector CT imaging of the abdomen and pelvis was performed using the standard protocol following bolus administration of intravenous contrast. CONTRAST:  128mL ISOVUE-300 IOPAMIDOL (ISOVUE-300) INJECTION 61% COMPARISON:  CT dated 03/02/2014 FINDINGS: Mild emphysematous changes of the right lower lobe. The visualized lung bases are otherwise clear. There is coronary vascular calcification. No intra-abdominal free air. Small free fluid within the pelvis. Small posterior diaphragmatic defects with herniation of the mesenteric fat, right greater left compatible with a Bochdalek's hernia. Stable appearing 18 mm ovoid hypodense lesion in the right lobe of the liver centrally, likely a cyst. Scattered smaller hepatic hypodense lesions are not well characterized but most likely represent cysts or hemangioma. Cholecystectomy. The pancreas, spleen, adrenal glands, kidneys, visualized ureters, and urinary bladder appear unremarkable. Hysterectomy. Oral contrast opacifies the stomach and multiple loops of small bowel. Multiple fluid-filled loop of bowel with mild associated inflammation noted in the pelvis. These loops of bowel measure up to 2.5 cm in diameter. There is a point of transition in the posterior pelvis (series 2, image 70) concerning for focal area of adhesion with possible low grade or early small bowel obstruction. The distal small bowel loops and terminal ileum are collapsed. There is postsurgical changes of partial sigmoid resection with anastomotic suture. Appendectomy. There is advanced aortoiliac atherosclerotic disease. The origins of the celiac axis, SMA, IMA as well as the origins of the renal arteries appear  patent. No portal venous gas identified. There is no adenopathy. The abdominal wall soft tissues appear unremarkable. Midline vertical anterior pelvic wall incisional scar noted. There is osteopenia with degenerative changes of the spine. No acute fracture. IMPRESSION: Findings concerning for a low grade or early distal small bowel obstruction with transition zone in the posterior pelvis likely related to underlying adhesion. Clinical correlation and follow-up recommended. Electronically Signed   By: Anner Crete M.D.   On: 04/08/2016 02:14     Medications:  Scheduled: . enoxaparin (LOVENOX) injection  40 mg Subcutaneous Q24H  . [START ON 04/10/2016] estradiol  0.1 mg Transdermal Q Mon  . famotidine (PEPCID) IV  20 mg Intravenous Q12H  . LORazepam  0.5 mg Oral QHS  . mometasone-formoterol  2 puff Inhalation BID  . tiotropium  18 mcg Inhalation Daily   Continuous: .  sodium chloride 75 mL/hr at 04/08/16 1000   [id number], dicyclomine, fluticasone, morphine injection, ondansetron (ZOFRAN) IV  Assessment/Plan:  Principal Problem:   Small bowel obstruction due to adhesions [facility])    Small bowel obstruction Likely due to adhesions. Patient denies any nausea, vomiting. Her abdomen is not that distended. Okay to hold off on NG tube. Leave her nothing by mouth for now. IV fluids. Hopefully this will resolve with just conservative management. If she doesn't improve, then we may have to consult general surgery. Abdominal series tomorrow.  History of essential hypertension. Monitor blood pressures closely. Holding her oral agents due to the small bowel obstruction.  History of COPD Patient continues to smoke cigarettes. She was counseled. Continue inhalers and nebulizer treatments. Stable currently.  DVT Prophylaxis: Lovenox    Code Status: Full code  Family Communication: Discussed with the patient and her husband  Disposition Plan: Management as outlined above.    LOS: 0 days    [facility] Pager [phone] 04/08/2016, 11:45 AM  If 7PM-7AM, please contact night-coverage at [email or web address], password [facility]

## 2016-04-09 ENCOUNTER — Inpatient Hospital Stay (HOSPITAL_COMMUNITY): Payer: BLUE CROSS/BLUE SHIELD

## 2016-04-09 DIAGNOSIS — J449 Chronic obstructive pulmonary disease, unspecified: Secondary | ICD-10-CM

## 2016-04-09 DIAGNOSIS — I10 Essential (primary) hypertension: Secondary | ICD-10-CM

## 2016-04-09 LAB — BASIC METABOLIC PANEL
Anion gap: 4 — ABNORMAL LOW (ref 5–15)
BUN: 9 mg/dL (ref 6–20)
CHLORIDE: 110 mmol/L (ref 101–111)
CO2: 25 mmol/L (ref 22–32)
CREATININE: 0.79 mg/dL (ref 0.44–1.00)
Calcium: 8 mg/dL — ABNORMAL LOW (ref 8.9–10.3)
GFR calc Af Amer: >60 mL/min (ref >60)
GFR calc non Af Amer: >60 mL/min (ref >60)
Glucose, Bld: 68 mg/dL (ref 65–99)
Potassium: 3.9 mmol/L (ref 3.5–5.1)
SODIUM: 139 mmol/L (ref 135–145)

## 2016-04-09 LAB — CBC
HEMATOCRIT: 33.1 % — AB (ref 36.0–46.0)
HEMOGLOBIN: 10.2 g/dL — AB (ref 12.0–15.0)
MCH: 29.7 pg (ref 26.0–34.0)
MCHC: 30.8 g/dL (ref 30.0–36.0)
MCV: 96.2 fL (ref 78.0–100.0)
Platelets: 173 10*3/uL (ref 150–400)
RBC: 3.44 MIL/uL — ABNORMAL LOW (ref 3.87–5.11)
RDW: 14 % (ref 11.5–15.5)
WBC: 4.6 10*3/uL (ref 4.0–10.5)

## 2016-04-09 MED ORDER — CYCLOBENZAPRINE HCL 5 MG PO TABS
5.0000 mg | ORAL_TABLET | Freq: Three times a day (TID) | ORAL | Status: DC | PRN
  Administered 2016-04-09: 5 mg via ORAL
  Filled 2016-04-09 (×2): qty 1

## 2016-04-09 MED ORDER — ACETAMINOPHEN 500 MG PO TABS
1000.0000 mg | ORAL_TABLET | Freq: Once | ORAL | Status: AC
  Administered 2016-04-09: 1000 mg via ORAL
  Filled 2016-04-09: qty 2

## 2016-04-09 MED ORDER — HYDROMORPHONE HCL 1 MG/ML IJ SOLN
1.0000 mg | Freq: Once | INTRAMUSCULAR | Status: AC
  Administered 2016-04-09: 1 mg via INTRAVENOUS
  Filled 2016-04-09: qty 1

## 2016-04-09 NOTE — Telephone Encounter (Signed)
Opened to review 

## 2016-04-09 NOTE — Progress Notes (Signed)
[facility] PROGRESS NOTE  Leah [name] [id number] DOB: 1945-12-27 DOA: 04/07/2016  PCP: [name], DO  Brief HPI: 70 year old Caucasian female with a past medical history of multiple abdominal surgeries including appendectomy, cholecystectomy, hysterectomy, surgery for diverticulitis in the form of sigmoid colon resection, who presented with complaints of abdominal pain. She was found to have early small bowel obstruction, likely due to adhesions. She was hospitalized for further management.  Past medical history:  Past Medical History:  Diagnosis Date  . Anxiety   . Barrett esophagus   . Bladder pain   . Chronic bronchitis ([place])    per pt last episode july 2016  . Complication of anesthesia    slow to wake  . Depression   . Diverticulosis   . Frequency of urination   . GERD (gastroesophageal reflux disease)   . Hiatal hernia   . History of adenomatous polyp of colon    tubular adenoma  . History of chronic gastritis   . History of diverticulitis of colon   . History of esophageal dilatation    for stricture  . History of esophageal spasm   . Hyperlipidemia   . Hypertension   . IBS (irritable bowel syndrome)   . Interstitial cystitis   . Productive cough    intermittant  . Smokers' cough ([place])   . Urgency of urination     Consultants: None  Procedures: None yet  Antibiotics: None  Subjective: Patient states that her abdomen feels slightly better. She is passing gas from below. Denies any nausea or vomiting. However, she also is complaining of muscle spasms in both her legs.   Objective:  Vital Signs  Vitals:   04/08/16 0413 04/08/16 1337 04/08/16 2235 04/09/16 0534  BP: 139/64 (!) 107/59  118/62  Pulse: (!) 58 60  62  Resp: 16 15  16   Temp: 97.7 F (36.5 C) 97.9 F (36.6 C)  98 F (36.7 C)  TempSrc: Oral Oral  Oral  SpO2: 98% 96% 92% 94%  Weight: 51.7 kg (113 lb 15.7 oz)     Height: 5\' 1"  (1.549 m)       Intake/Output  Summary (Last 24 hours) at 04/09/16 1010 Last data filed at 04/09/16 0302  Gross per 24 hour  Intake              950 ml  Output              300 ml  Net              650 ml   Filed Weights   04/07/16 2106 04/08/16 0413  Weight: 51.7 kg (114 lb) 51.7 kg (113 lb 15.7 oz)    General appearance: alert, cooperative, appears stated age and no distress Resp: clear to auscultation bilaterally Cardio: regular rate and rhythm, S1, S2 normal, no murmur, click, rub or gallop GI: Abdomen remains soft. No distention. Bowel sounds are present and normal. Still continues to be tender in the left lower quarter and without any rebound, rigidity or guarding. No masses or organomegaly.  Extremities: extremities normal, atraumatic, no cyanosis or edema Neurologic: No focal deficits  Lab Results:  Data Reviewed: I have personally reviewed following labs and imaging studies  CBC:  Recent Labs Lab 04/07/16 2200 04/08/16 0737 04/09/16 0552  WBC 7.8 5.8 4.6  NEUTROABS 4.8  --   --   HGB 13.3 11.0* 10.2*  HCT 40.7 34.8* 33.1*  MCV 94.0 93.3 96.2  PLT  213 195 [id number]   Basic Metabolic Panel:  Recent Labs Lab 04/07/16 2200 04/08/16 0737 04/09/16 0552  NA 141 140 139  K 3.6 3.6 3.9  CL 108 107 110  CO2 24 28 25   GLUCOSE 95 93 68  BUN 11 8 9   CREATININE 0.85 0.69 0.79  CALCIUM 8.7* 7.7* 8.0*   GFR: Estimated Creatinine Clearance: 51.5 mL/min (by C-G formula based on SCr of 0.8 mg/dL). Liver Function Tests:  Recent Labs Lab 04/07/16 2200  AST 17  ALT 9*  ALKPHOS 63  BILITOT 0.4  PROT 6.8  ALBUMIN 3.8    Recent Labs Lab 04/07/16 2200  LIPASE 23   Urine analysis:    Component Value Date/Time   COLORURINE YELLOW 04/07/2016 2109   APPEARANCEUR CLOUDY (A) 04/07/2016 2109   LABSPEC 1.024 04/07/2016 2109   PHURINE 5.5 04/07/2016 2109   GLUCOSEU NEGATIVE 04/07/2016 2109   HGBUR NEGATIVE 04/07/2016 2109   BILIRUBINUR NEGATIVE 04/07/2016 2109   BILIRUBINUR neg 03/30/2016 1110    KETONESUR NEGATIVE 04/07/2016 2109   PROTEINUR NEGATIVE 04/07/2016 2109   UROBILINOGEN 0.2 03/30/2016 1110   UROBILINOGEN 0.2 09/19/2013 1627   NITRITE NEGATIVE 04/07/2016 2109   LEUKOCYTESUR NEGATIVE 04/07/2016 2109     Radiology Studies: Ct Abdomen Pelvis W Contrast  Result Date: 04/08/2016 CLINICAL DATA:  70 year old female with abdominal pain and nausea. EXAM: CT ABDOMEN AND PELVIS WITH CONTRAST TECHNIQUE: Multidetector CT imaging of the abdomen and pelvis was performed using the standard protocol following bolus administration of intravenous contrast. CONTRAST:  174mL ISOVUE-300 IOPAMIDOL (ISOVUE-300) INJECTION 61% COMPARISON:  CT dated 03/02/2014 FINDINGS: Mild emphysematous changes of the right lower lobe. The visualized lung bases are otherwise clear. There is coronary vascular calcification. No intra-abdominal free air. Small free fluid within the pelvis. Small posterior diaphragmatic defects with herniation of the mesenteric fat, right greater left compatible with a Bochdalek's hernia. Stable appearing 18 mm ovoid hypodense lesion in the right lobe of the liver centrally, likely a cyst. Scattered smaller hepatic hypodense lesions are not well characterized but most likely represent cysts or hemangioma. Cholecystectomy. The pancreas, spleen, adrenal glands, kidneys, visualized ureters, and urinary bladder appear unremarkable. Hysterectomy. Oral contrast opacifies the stomach and multiple loops of small bowel. Multiple fluid-filled loop of bowel with mild associated inflammation noted in the pelvis. These loops of bowel measure up to 2.5 cm in diameter. There is a point of transition in the posterior pelvis (series 2, image 70) concerning for focal area of adhesion with possible low grade or early small bowel obstruction. The distal small bowel loops and terminal ileum are collapsed. There is postsurgical changes of partial sigmoid resection with anastomotic suture. Appendectomy. There is advanced  aortoiliac atherosclerotic disease. The origins of the celiac axis, SMA, IMA as well as the origins of the renal arteries appear patent. No portal venous gas identified. There is no adenopathy. The abdominal wall soft tissues appear unremarkable. Midline vertical anterior pelvic wall incisional scar noted. There is osteopenia with degenerative changes of the spine. No acute fracture. IMPRESSION: Findings concerning for a low grade or early distal small bowel obstruction with transition zone in the posterior pelvis likely related to underlying adhesion. Clinical correlation and follow-up recommended. Electronically Signed   By: Anner Crete M.D.   On: 04/08/2016 02:14   Dg Abd 2 Views  Result Date: 04/09/2016 CLINICAL DATA:  Acute generalized abdominal pain. EXAM: ABDOMEN - 2 VIEW COMPARISON:  CT scan of April 08, 2016. FINDINGS: No definite dilated bowel  is seen currently. No colonic dilatation is noted. Residual contrast is noted in the colon. Status post cholecystectomy. Phleboliths are noted in the pelvis. IMPRESSION: No abnormal bowel dilatation is noted currently. Residual contrast seen in colon. Electronically Signed   By: Marijo Conception, M.D.   On: 04/09/2016 09:05     Medications:  Scheduled: . acetaminophen  1,000 mg Oral Once  . enoxaparin (LOVENOX) injection  40 mg Subcutaneous Q24H  . [START ON 04/10/2016] estradiol  0.1 mg Transdermal Q Mon  . famotidine (PEPCID) IV  20 mg Intravenous Q12H  .  HYDROmorphone (DILAUDID) injection  1 mg Intravenous Once  . LORazepam  0.5 mg Oral QHS  . mometasone-formoterol  2 puff Inhalation BID  . tiotropium  18 mcg Inhalation Daily   Continuous: . sodium chloride 75 mL/hr at 04/09/16 0307   [id number], albuterol, dicyclomine, fluticasone, morphine injection, ondansetron (ZOFRAN) IV  Assessment/Plan:  Principal Problem:   Small bowel obstruction due to adhesions [facility])    Small bowel obstruction Likely due to adhesions.  Patient seems to be improving. X-ray from this morning shows contrast in the colon. She is passing flatus. Advance to clear liquids. Monitor closely.   History of essential hypertension. Monitor blood pressures closely. Holding her oral agents due to the small bowel obstruction.  History of COPD Patient continues to smoke cigarettes. She was counseled. Continue inhalers and nebulizer treatments. Stable currently.  Flexeril for muscle spasms.  DVT Prophylaxis: Lovenox    Code Status: Full code  Family Communication: Discussed with the patient and her husband  Disposition Plan: Management as outlined above. Mobilize.    LOS: 1 day   [facility] Pager [phone] 04/09/2016, 10:10 AM  If 7PM-7AM, please contact night-coverage at [email or web address], password [facility]

## 2016-04-10 DIAGNOSIS — D649 Anemia, unspecified: Secondary | ICD-10-CM

## 2016-04-10 DIAGNOSIS — M62838 Other muscle spasm: Secondary | ICD-10-CM

## 2016-04-10 LAB — BASIC METABOLIC PANEL
ANION GAP: 4 — AB (ref 5–15)
BUN: 6 mg/dL (ref 6–20)
CO2: 27 mmol/L (ref 22–32)
Calcium: 8.2 mg/dL — ABNORMAL LOW (ref 8.9–10.3)
Chloride: 108 mmol/L (ref 101–111)
Creatinine, Ser: 0.65 mg/dL (ref 0.44–1.00)
GLUCOSE: 98 mg/dL (ref 65–99)
POTASSIUM: 3.8 mmol/L (ref 3.5–5.1)
Sodium: 139 mmol/L (ref 135–145)

## 2016-04-10 LAB — CBC
HEMATOCRIT: 33.2 % — AB (ref 36.0–46.0)
Hemoglobin: 10.2 g/dL — ABNORMAL LOW (ref 12.0–15.0)
MCH: 29.1 pg (ref 26.0–34.0)
MCHC: 30.7 g/dL (ref 30.0–36.0)
MCV: 94.9 fL (ref 78.0–100.0)
PLATELETS: 169 10*3/uL (ref 150–400)
RBC: 3.5 MIL/uL — AB (ref 3.87–5.11)
RDW: 13.6 % (ref 11.5–15.5)
WBC: 5.2 10*3/uL (ref 4.0–10.5)

## 2016-04-10 MED ORDER — OXYCODONE-ACETAMINOPHEN 5-325 MG PO TABS
2.0000 | ORAL_TABLET | Freq: Once | ORAL | Status: AC
  Administered 2016-04-10: 2 via ORAL
  Filled 2016-04-10: qty 2

## 2016-04-10 MED ORDER — CYCLOBENZAPRINE HCL 5 MG PO TABS
5.0000 mg | ORAL_TABLET | Freq: Three times a day (TID) | ORAL | Status: DC
  Administered 2016-04-10 – 2016-04-12 (×8): 5 mg via ORAL
  Filled 2016-04-10 (×8): qty 1

## 2016-04-10 MED ORDER — CYCLOBENZAPRINE HCL 5 MG PO TABS
5.0000 mg | ORAL_TABLET | Freq: Once | ORAL | Status: AC
  Administered 2016-04-10: 5 mg via ORAL
  Filled 2016-04-10: qty 1

## 2016-04-10 NOTE — Progress Notes (Signed)
[facility] PROGRESS NOTE  Leah [name] [id number] DOB: Apr 23, 1946 DOA: 04/07/2016  PCP: [name], DO  Brief HPI: 70 year old Caucasian female with a past medical history of multiple abdominal surgeries including appendectomy, cholecystectomy, hysterectomy, surgery for diverticulitis in the form of sigmoid colon resection, who presented with complaints of abdominal pain. She was found to have early small bowel obstruction, likely due to adhesions. She was hospitalized for further management.  Past medical history:  Past Medical History:  Diagnosis Date  . Anxiety   . Barrett esophagus   . Bladder pain   . Chronic bronchitis ([place])    per pt last episode july 2016  . Complication of anesthesia    slow to wake  . Depression   . Diverticulosis   . Frequency of urination   . GERD (gastroesophageal reflux disease)   . Hiatal hernia   . History of adenomatous polyp of colon    tubular adenoma  . History of chronic gastritis   . History of diverticulitis of colon   . History of esophageal dilatation    for stricture  . History of esophageal spasm   . Hyperlipidemia   . Hypertension   . IBS (irritable bowel syndrome)   . Interstitial cystitis   . Productive cough    intermittant  . Smokers' cough ([place])   . Urgency of urination     Consultants: None  Procedures: None yet  Antibiotics: None  Subjective: Patient continues to have significant muscle spasms and cramps in her legs. Her abdomen feels slightly better. She is passing flatus. She had one episode of emesis yesterday afternoon, none since then.    Objective:  Vital Signs  Vitals:   04/09/16 1951 04/09/16 2103 04/10/16 0536 04/10/16 0853  BP:  (!) 114/53 (!) 151/69   Pulse:  (!) 52 (!) 54   Resp:  16 16   Temp:  98.2 F (36.8 C) 98.4 F (36.9 C)   TempSrc:  Oral Oral   SpO2: 93% 97% 94% 92%  Weight:      Height:        Intake/Output Summary (Last 24 hours) at 04/10/16  [id number] Last data filed at 04/10/16 0517  Gross per 24 hour  Intake             2445 ml  Output             1250 ml  Net             1195 ml   Filed Weights   04/07/16 2106 04/08/16 0413  Weight: 51.7 kg (114 lb) 51.7 kg (113 lb 15.7 oz)    General appearance: alert, cooperative, appears stated age and no distress Resp: clear to auscultation bilaterally Cardio: regular rate and rhythm, S1, S2 normal, no murmur, click, rub or gallop GI: Abdomen remains soft. No distention. Bowel sounds are present and normal. Still continues to be tender in the left lower quarter and without any rebound, rigidity or guarding. Less tender compared to yesterday. No masses or organomegaly.  Extremities: No obvious deformity noted in the legs. Neurologic: No focal deficits  Lab Results:  Data Reviewed: I have personally reviewed following labs and imaging studies  CBC:  Recent Labs Lab 04/07/16 2200 04/08/16 0737 04/09/16 0552 04/10/16 0233  WBC 7.8 5.8 4.6 5.2  NEUTROABS 4.8  --   --   --   HGB 13.3 11.0* 10.2* 10.2*  HCT 40.7 34.8* 33.1* 33.2*  MCV 94.0 93.3  96.2 94.9  PLT 213 195 173 [id number]   Basic Metabolic Panel:  Recent Labs Lab 04/07/16 2200 04/08/16 0737 04/09/16 0552 04/10/16 0233  NA 141 140 139 139  K 3.6 3.6 3.9 3.8  CL 108 107 110 108  CO2 24 28 25 27   GLUCOSE 95 93 68 98  BUN 11 8 9 6   CREATININE 0.85 0.69 0.79 0.65  CALCIUM 8.7* 7.7* 8.0* 8.2*   GFR: Estimated Creatinine Clearance: 51.5 mL/min (by C-G formula based on SCr of 0.8 mg/dL). Liver Function Tests:  Recent Labs Lab 04/07/16 2200  AST 17  ALT 9*  ALKPHOS 63  BILITOT 0.4  PROT 6.8  ALBUMIN 3.8    Recent Labs Lab 04/07/16 2200  LIPASE 23   Urine analysis:    Component Value Date/Time   COLORURINE YELLOW 04/07/2016 2109   APPEARANCEUR CLOUDY (A) 04/07/2016 2109   LABSPEC 1.024 04/07/2016 2109   PHURINE 5.5 04/07/2016 2109   GLUCOSEU NEGATIVE 04/07/2016 2109   HGBUR NEGATIVE 04/07/2016 2109    BILIRUBINUR NEGATIVE 04/07/2016 2109   BILIRUBINUR neg 03/30/2016 1110   KETONESUR NEGATIVE 04/07/2016 2109   PROTEINUR NEGATIVE 04/07/2016 2109   UROBILINOGEN 0.2 03/30/2016 1110   UROBILINOGEN 0.2 09/19/2013 1627   NITRITE NEGATIVE 04/07/2016 2109   LEUKOCYTESUR NEGATIVE 04/07/2016 2109     Radiology Studies: Dg Abd 2 Views  Result Date: 04/09/2016 CLINICAL DATA:  Acute generalized abdominal pain. EXAM: ABDOMEN - 2 VIEW COMPARISON:  CT scan of April 08, 2016. FINDINGS: No definite dilated bowel is seen currently. No colonic dilatation is noted. Residual contrast is noted in the colon. Status post cholecystectomy. Phleboliths are noted in the pelvis. IMPRESSION: No abnormal bowel dilatation is noted currently. Residual contrast seen in colon. Electronically Signed   By: Marijo Conception, M.D.   On: 04/09/2016 09:05     Medications:  Scheduled: . cyclobenzaprine  5 mg Oral TID  . enoxaparin (LOVENOX) injection  40 mg Subcutaneous Q24H  . estradiol  0.1 mg Transdermal Q Mon  . famotidine (PEPCID) IV  20 mg Intravenous Q12H  . LORazepam  0.5 mg Oral QHS  . mometasone-formoterol  2 puff Inhalation BID  . tiotropium  18 mcg Inhalation Daily   Continuous: . sodium chloride 75 mL/hr at 04/09/16 1707   [id number], albuterol, dicyclomine, fluticasone, morphine injection, ondansetron (ZOFRAN) IV  Assessment/Plan:  Principal Problem:   Small bowel obstruction due to adhesions ([facility])    Small bowel obstruction Likely due to adhesions. Patient is very slow to respond. She did have an episode of emesis yesterday, however, is feeling okay this morning. She continues to have bowel sounds. She is passing flatus. Continue just with clear liquids for now. If she does not improve as anticipated in the next 24 hours, may need to consult general surgery. Holding off on NG tube since she does not have significant distention or any significant vomiting.   History of essential  hypertension. Monitor blood pressures closely. Holding her oral agents due to the small bowel obstruction.  History of COPD Patient continues to smoke cigarettes. She was counseled. Continue inhalers and nebulizer treatments. Stable currently.  Lower extremity Muscle spasms No obvious deformity noted in the legs. Electrolytes are normal. Continue with Flexeril for now.   Normocytic anemia Drop in hemoglobin is likely dilutional. No evidence for overt bleeding. Continue to monitor closely.  DVT Prophylaxis: Lovenox    Code Status: Full code  Family Communication: Discussed with the patient and her husband  Disposition Plan: Management as outlined above. Mobilize.    LOS: 2 days   [facility] Pager [phone] 04/10/2016, 9:22 AM  If 7PM-7AM, please contact night-coverage at [email or web address], password [facility]

## 2016-04-11 ENCOUNTER — Inpatient Hospital Stay (HOSPITAL_COMMUNITY): Payer: BLUE CROSS/BLUE SHIELD

## 2016-04-11 DIAGNOSIS — N179 Acute kidney failure, unspecified: Secondary | ICD-10-CM

## 2016-04-11 LAB — BASIC METABOLIC PANEL
Anion gap: 10 (ref 5–15)
BUN: 8 mg/dL (ref 6–20)
CALCIUM: 8.5 mg/dL — AB (ref 8.9–10.3)
CO2: 25 mmol/L (ref 22–32)
CREATININE: 1.32 mg/dL — AB (ref 0.44–1.00)
Chloride: 102 mmol/L (ref 101–111)
GFR calc Af Amer: 47 mL/min — ABNORMAL LOW (ref >60)
GFR, EST NON AFRICAN AMERICAN: 41 mL/min — AB (ref >60)
Glucose, Bld: 224 mg/dL — ABNORMAL HIGH (ref 65–99)
POTASSIUM: 3.7 mmol/L (ref 3.5–5.1)
SODIUM: 137 mmol/L (ref 135–145)

## 2016-04-11 LAB — MAGNESIUM: MAGNESIUM: 1.5 mg/dL — AB (ref 1.7–2.4)

## 2016-04-11 MED ORDER — FAMOTIDINE 20 MG PO TABS
20.0000 mg | ORAL_TABLET | Freq: Two times a day (BID) | ORAL | Status: DC
  Administered 2016-04-11 – 2016-04-12 (×2): 20 mg via ORAL
  Filled 2016-04-11 (×2): qty 1

## 2016-04-11 MED ORDER — MAGNESIUM SULFATE 2 GM/50ML IV SOLN
2.0000 g | Freq: Once | INTRAVENOUS | Status: AC
  Administered 2016-04-11: 2 g via INTRAVENOUS
  Filled 2016-04-11: qty 50

## 2016-04-11 MED ORDER — SODIUM CHLORIDE 0.9 % IV BOLUS (SEPSIS)
500.0000 mL | Freq: Once | INTRAVENOUS | Status: AC
  Administered 2016-04-11: 500 mL via INTRAVENOUS

## 2016-04-11 NOTE — Progress Notes (Signed)
RT came to give spiriva and dulera. Pt states someone has already given the two medications this AM. Pt in no distress and refuses at this time.

## 2016-04-11 NOTE — Progress Notes (Signed)
[facility] PROGRESS NOTE  Leah [name] [id number] DOB: May 25, 1946 DOA: 04/07/2016  PCP: [name], DO  Brief HPI: 70 year old Caucasian female with a past medical history of multiple abdominal surgeries including appendectomy, cholecystectomy, hysterectomy, surgery for diverticulitis in the form of sigmoid colon resection, who presented with complaints of abdominal pain. She was found to have early small bowel obstruction, likely due to adhesions. She was hospitalized for further management.   Past medical history:  Past Medical History:  Diagnosis Date  . Anxiety   . Barrett esophagus   . Bladder pain   . Chronic bronchitis ([place])    per pt last episode july 2016  . Complication of anesthesia    slow to wake  . Depression   . Diverticulosis   . Frequency of urination   . GERD (gastroesophageal reflux disease)   . Hiatal hernia   . History of adenomatous polyp of colon    tubular adenoma  . History of chronic gastritis   . History of diverticulitis of colon   . History of esophageal dilatation    for stricture  . History of esophageal spasm   . Hyperlipidemia   . Hypertension   . IBS (irritable bowel syndrome)   . Interstitial cystitis   . Productive cough    intermittant  . Smokers' cough ([place])   . Urgency of urination     Consultants: None  Procedures: None yet  Antibiotics: None  Subjective: Patient continues to have significant muscle spasms and cramps in her legs. She states that her abdomen feels better. Has not had any nausea or vomiting. However, she tells me that she is not passing any gas. Tolerating her clear liquids.   Objective:  Vital Signs  Vitals:   04/10/16 1414 04/10/16 2100 04/10/16 2205 04/11/16 0604  BP: (!) 111/57  127/61 (!) 147/67  Pulse: 64  (!) 54 62  Resp: 17  16 16   Temp: 98.7 F (37.1 C)  98.5 F (36.9 C) 98.8 F (37.1 C)  TempSrc: Oral  Oral Oral  SpO2: 94% 94% 90% 93%  Weight:      Height:         Intake/Output Summary (Last 24 hours) at 04/11/16 0913 Last data filed at 04/11/16 0733  Gross per 24 hour  Intake          1111.25 ml  Output             1600 ml  Net          -488.75 ml   Filed Weights   04/07/16 2106 04/08/16 0413  Weight: 51.7 kg (114 lb) 51.7 kg (113 lb 15.7 oz)    General appearance: alert, cooperative, appears stated age and no distress Resp: clear to auscultation bilaterally Cardio: regular rate and rhythm, S1, S2 normal, no murmur, click, rub or gallop GI: Abdomen remains soft. No distention. Bowel sounds are Sluggish today. Less tender in the left lower quadrant today compared to yesterday. There is no rebound, rigidity or guarding. No masses or organomegaly.  Extremities: No obvious deformity noted in the legs. Good pedal pulses. Neurologic: No focal deficits  Lab Results:  Data Reviewed: I have personally reviewed following labs and imaging studies  CBC:  Recent Labs Lab 04/07/16 2200 04/08/16 0737 04/09/16 0552 04/10/16 0233  WBC 7.8 5.8 4.6 5.2  NEUTROABS 4.8  --   --   --   HGB 13.3 11.0* 10.2* 10.2*  HCT 40.7 34.8* 33.1* 33.2*  MCV 94.0 93.3 96.2 94.9  PLT 213 195 173 [id number]   Basic Metabolic Panel:  Recent Labs Lab 04/07/16 2200 04/08/16 0737 04/09/16 0552 04/10/16 0233  NA 141 140 139 139  K 3.6 3.6 3.9 3.8  CL 108 107 110 108  CO2 24 28 25 27   GLUCOSE 95 93 68 98  BUN 11 8 9 6   CREATININE 0.85 0.69 0.79 0.65  CALCIUM 8.7* 7.7* 8.0* 8.2*   GFR: Estimated Creatinine Clearance: 51.5 mL/min (by C-G formula based on SCr of 0.8 mg/dL). Liver Function Tests:  Recent Labs Lab 04/07/16 2200  AST 17  ALT 9*  ALKPHOS 63  BILITOT 0.4  PROT 6.8  ALBUMIN 3.8    Recent Labs Lab 04/07/16 2200  LIPASE 23   Urine analysis:    Component Value Date/Time   COLORURINE YELLOW 04/07/2016 2109   APPEARANCEUR CLOUDY (A) 04/07/2016 2109   LABSPEC 1.024 04/07/2016 2109   PHURINE 5.5 04/07/2016 2109   GLUCOSEU NEGATIVE  04/07/2016 2109   HGBUR NEGATIVE 04/07/2016 2109   BILIRUBINUR NEGATIVE 04/07/2016 2109   BILIRUBINUR neg 03/30/2016 1110   KETONESUR NEGATIVE 04/07/2016 2109   PROTEINUR NEGATIVE 04/07/2016 2109   UROBILINOGEN 0.2 03/30/2016 1110   UROBILINOGEN 0.2 09/19/2013 1627   NITRITE NEGATIVE 04/07/2016 2109   LEUKOCYTESUR NEGATIVE 04/07/2016 2109     Radiology Studies: No results found.   Medications:  Scheduled: . cyclobenzaprine  5 mg Oral TID  . enoxaparin (LOVENOX) injection  40 mg Subcutaneous Q24H  . estradiol  0.1 mg Transdermal Q Mon  . famotidine (PEPCID) IV  20 mg Intravenous Q12H  . LORazepam  0.5 mg Oral QHS  . mometasone-formoterol  2 puff Inhalation BID  . tiotropium  18 mcg Inhalation Daily   Continuous: . sodium chloride 75 mL/hr at 04/11/16 [id number]   [id number], albuterol, dicyclomine, fluticasone, morphine injection, ondansetron (ZOFRAN) IV  Assessment/Plan:  Principal Problem:   Small bowel obstruction due to adhesions [facility])    Small bowel obstruction Likely due to adhesions. Patient is improving slowly. Abdominal film shows no evidence for SBO today. She is tolerating her clear liquids. Advance to full liquids. She did not require NG tube since she did not have significant distention or any significant vomiting.   Lower extremity Muscle spasms No obvious deformity noted in the legs. Magnesium is noted to be slightly low this morning, which will be repleted. Give her IV fluids. Continue with Flexeril for now. She has been asked to mobilize. CT scan of the abdomen and pelvis report reviewed. She was found to have significant aortoiliac atherosclerosis. No evidence for any vascular insufficiency on examination. Plus her symptoms are mainly in the thigh area. However, we will proceed with lower extremity arterial Dopplers.  Mild acute renal failure Elevated creatinine is noted. Monitor urine output. IV fluid bolus. Repeat labs in the morning.  History of  essential hypertension. Monitor blood pressures closely. Holding her oral agents due to the small bowel obstruction.  History of COPD Patient continues to smoke cigarettes. She was counseled. Continue inhalers and nebulizer treatments. Stable currently.  Normocytic anemia Drop in hemoglobin is likely dilutional. No evidence for overt bleeding. Continue to monitor closely.  DVT Prophylaxis: Lovenox    Code Status: Full code  Family Communication: Discussed with the patient and her husband  Disposition Plan: Management as outlined above. Mobilize.    LOS: 3 days   [facility] Pager [phone] 04/11/2016, 9:13 AM  If 7PM-7AM, please contact night-coverage at  [email or web address], password [facility]

## 2016-04-12 ENCOUNTER — Inpatient Hospital Stay (HOSPITAL_COMMUNITY): Payer: BLUE CROSS/BLUE SHIELD

## 2016-04-12 ENCOUNTER — Encounter (HOSPITAL_COMMUNITY): Payer: Self-pay

## 2016-04-12 DIAGNOSIS — M79609 Pain in unspecified limb: Secondary | ICD-10-CM

## 2016-04-12 DIAGNOSIS — K565 Intestinal adhesions [bands] with obstruction (postprocedural) (postinfection): Principal | ICD-10-CM

## 2016-04-12 LAB — BASIC METABOLIC PANEL
Anion gap: 6 (ref 5–15)
CHLORIDE: 102 mmol/L (ref 101–111)
CO2: 30 mmol/L (ref 22–32)
Calcium: 8.6 mg/dL — ABNORMAL LOW (ref 8.9–10.3)
Creatinine, Ser: 0.68 mg/dL (ref 0.44–1.00)
GFR calc Af Amer: >60 mL/min (ref >60)
GFR calc non Af Amer: >60 mL/min (ref >60)
GLUCOSE: 97 mg/dL (ref 65–99)
POTASSIUM: 3.4 mmol/L — AB (ref 3.5–5.1)
Sodium: 138 mmol/L (ref 135–145)

## 2016-04-12 LAB — MAGNESIUM: Magnesium: 2 mg/dL (ref 1.7–2.4)

## 2016-04-12 LAB — CBC
HEMATOCRIT: 35.7 % — AB (ref 36.0–46.0)
Hemoglobin: 11.3 g/dL — ABNORMAL LOW (ref 12.0–15.0)
MCH: 29.4 pg (ref 26.0–34.0)
MCHC: 31.7 g/dL (ref 30.0–36.0)
MCV: 93 fL (ref 78.0–100.0)
Platelets: 190 10*3/uL (ref 150–400)
RBC: 3.84 MIL/uL — ABNORMAL LOW (ref 3.87–5.11)
RDW: 13.6 % (ref 11.5–15.5)
WBC: 6.7 10*3/uL (ref 4.0–10.5)

## 2016-04-12 MED ORDER — SENNOSIDES-DOCUSATE SODIUM 8.6-50 MG PO TABS
1.0000 | ORAL_TABLET | Freq: Two times a day (BID) | ORAL | Status: DC
  Filled 2016-04-12: qty 1

## 2016-04-12 MED ORDER — POTASSIUM CHLORIDE CRYS ER 20 MEQ PO TBCR
40.0000 meq | EXTENDED_RELEASE_TABLET | Freq: Once | ORAL | Status: AC
  Administered 2016-04-12: 40 meq via ORAL
  Filled 2016-04-12: qty 2

## 2016-04-12 MED ORDER — DICYCLOMINE HCL 10 MG PO CAPS: ORAL_CAPSULE | ORAL | 3 refills | Status: DC

## 2016-04-12 MED ORDER — ASPIRIN EC 81 MG PO TBEC: 81.0000 mg | DELAYED_RELEASE_TABLET | Freq: Every day | ORAL | Status: DC

## 2016-04-12 MED ORDER — SENNOSIDES-DOCUSATE SODIUM 8.6-50 MG PO TABS: 1.0000 | ORAL_TABLET | Freq: Every day | ORAL | 0 refills | Status: DC

## 2016-04-12 MED ORDER — POLYETHYLENE GLYCOL 3350 17 G PO PACK: 17.0000 g | PACK | Freq: Every day | ORAL | 0 refills | Status: DC | PRN

## 2016-04-12 MED ORDER — POLYETHYLENE GLYCOL 3350 17 G PO PACK
17.0000 g | PACK | Freq: Once | ORAL | Status: DC
  Filled 2016-04-12: qty 1

## 2016-04-12 MED ORDER — CYCLOBENZAPRINE HCL 5 MG PO TABS: 5.0000 mg | ORAL_TABLET | Freq: Three times a day (TID) | ORAL | 1 refills | Status: DC

## 2016-04-12 NOTE — Progress Notes (Signed)
[name] Lawlor to be D/C'd  per MD order. Discussed with the patient and all questions fully answered.  VSS, Skin clean, dry and intact without evidence of skin break down, no evidence of skin tears noted.  IV catheter discontinued intact. Site without signs and symptoms of complications. Dressing and pressure applied.  An After Visit Summary was printed and given to the patient. Patient received prescription.  D/c education completed with patient/family including follow up instructions, medication list, d/c activities limitations if indicated, with other d/c instructions as indicated by MD - patient able to verbalize understanding, all questions fully answered.   Patient instructed to return to ED, call 911, or call MD for any changes in condition.   Patient to be escorted via [place], and D/C home via private auto.

## 2016-04-12 NOTE — Discharge Summary (Addendum)
Physician Discharge Summary   Patient ID: LEVI PEPPEL MRN: [id number] DOB/AGE: 68/09/1945 68 y.o.  Admit date: 04/07/2016 Discharge date: 04/12/2016  Primary Care Physician:  [name], DO  Discharge Diagnoses:    . Small bowel obstruction due to adhesions [facility]) Lower extremity muscle spasms Mild acute renal insufficiency Hypertension COPD Normocytic anemia Moderate PAD on left lower extremity   Consults:  None  Recommendations for Outpatient Follow-up:  1. Patient recommended soft diet for next few days 2. Please repeat CBC/BMET at next visit  DIET: Heart healthy diet    Allergies:   Allergies  Allergen Reactions  . Amoxicillin-Pot Clavulanate Shortness Of Breath, Itching and Swelling  . Avelox [Moxifloxacin Hcl In Nacl] Swelling    Swelling, itching and shortness of breath  . Ciprofloxacin Shortness Of Breath, Itching and Swelling  . Keflex [Cephalexin] Shortness Of Breath and Swelling  . Moxifloxacin Shortness Of Breath, Itching and Swelling  . Penicillins Shortness Of Breath, Itching and Swelling  . Sulfa Antibiotics Shortness Of Breath, Itching and Swelling  . Prednisone Other (See Comments)    GI irritation; is able to tolerate injections  . Acyclovir And Related Other (See Comments)    Pt does not recall this reaction     DISCHARGE MEDICATIONS: Current Discharge Medication List    START taking these medications   Details  aspirin EC 81 MG tablet Take 1 tablet (81 mg total) by mouth daily. Over the counter    cyclobenzaprine (FLEXERIL) 5 MG tablet Take 1 tablet (5 mg total) by mouth 3 (three) times daily. Qty: 90 tablet, Refills: 1    polyethylene glycol (MIRALAX / GLYCOLAX) packet Take 17 g by mouth daily as needed for moderate constipation. Qty: 14 each, Refills: 0    senna-docusate (SENOKOT-S) 8.6-50 MG tablet Take 1 tablet by mouth at bedtime. Qty: 30 tablet, Refills: 0      CONTINUE these medications which have CHANGED    Details  dicyclomine (BENTYL) 10 MG capsule Take 1 capsule by mouth three times daily as needed for cramping and spasms. Qty: 60 capsule, Refills: 3      CONTINUE these medications which have NOT CHANGED   Details  albuterol (PROVENTIL) (2.5 MG/3ML) 0.083% nebulizer solution Inhale 2.5 mg into the lungs every 4 (four) hours as needed for wheezing or shortness of breath. q 4-6 hours as needed Refills: 0    esomeprazole (NEXIUM) 40 MG capsule Take 1 capsule (40 mg total) by mouth 2 (two) times daily before a meal. Qty: 180 capsule, Refills: 3   Associated Diagnoses: Gastroesophageal reflux disease, esophagitis presence not specified    estradiol (CLIMARA - DOSED IN MG/24 HR) 0.1 mg/24hr patch Place 0.1 mg onto the skin every Monday. Refills: 0    ezetimibe (ZETIA) 10 MG tablet Take 1 tablet (10 mg total) by mouth daily. Qty: 30 tablet, Refills: 2   Associated Diagnoses: Hyperlipidemia    ibuprofen (ADVIL,MOTRIN) 200 MG tablet Take 400 mg by mouth every 6 (six) hours as needed (pain).    lisinopril (PRINIVIL,ZESTRIL) 20 MG tablet Take 1 tablet (20 mg total) by mouth daily. Qty: 30 tablet, Refills: 2    LORazepam (ATIVAN) 0.5 MG tablet Take 0.5 mg by mouth at bedtime.  Refills: 5    mometasone-formoterol (DULERA) 200-5 MCG/ACT AERO 2 puffs bid Qty: 1 Inhaler, Refills: 2   Associated Diagnoses: COPD exacerbation (HCC)    sertraline (ZOLOFT) 50 MG tablet Take 50 mg by mouth at bedtime.    sodium  chloride (OCEAN) 0.65 % SOLN nasal spray Place 1 spray into both nostrils daily as needed for congestion.    tiotropium (SPIRIVA HANDIHALER) 18 MCG inhalation capsule Place 1 capsule (18 mcg total) into inhaler and inhale daily. Qty: 30 capsule, Refills: 12   Associated Diagnoses: COPD exacerbation ([place])    atorvastatin (LIPITOR) 40 MG tablet Take 1 tablet (40 mg total) by mouth daily at 6 PM. Qty: 30 tablet, Refills: 2   Associated Diagnoses: Hyperlipidemia    fluticasone (FLONASE) 50  MCG/ACT nasal spray Place 2 sprays into both nostrils daily. Qty: 16 g, Refills: 6   Associated Diagnoses: Acute frontal sinusitis, recurrence not specified      STOP taking these medications     clarithromycin (BIAXIN XL) 500 MG 24 hr tablet      doxycycline (VIBRA-TABS) 100 MG tablet      doxycycline (VIBRA-TABS) 100 MG tablet      estradiol (VIVELLE-DOT) 0.1 MG/24HR          Brief H and P: For complete details please refer to admission H and P, but in brief62 year old Caucasian female with a past medical history of multiple abdominal surgeries including appendectomy, cholecystectomy, hysterectomy, surgery for diverticulitis in the form of sigmoid colon resection, who presented with complaints of abdominal pain. She was found to have early small bowel obstruction, likely due to adhesions. She was hospitalized for further management.   Hospital Course:  Small bowel obstruction Likely due to adhesions. CT of the abdomen showed low-grade early distal small bowel obstruction with transition zone. Patient was placed on nothing by mouth status. She did not require NG tube since she did not have significant distention or vomiting. Repeat abdominal x-rays showed no obstruction. Patient was advanced on diet, currently tolerating soft diet without any difficulty.  Lower extremity Muscle spasms No obvious deformity noted in the legs. Potassium and magnesium were replaced. Patient was started on Flexeril. Patient is now ambulating in the room and outside without any difficulty. Doppler ultrasound of the lower extremities showed no DVT. CT of the abdomen and pelvis had shown aortoiliac atherosclerosis  Arterial doppler showed moderate PAD 0.72 on LLE, 1.06 on right. Patient placed on aspirin and recommended risk factor modification with exercise, diet, weight control, lipid control. Outpatient follow-up/referal  with vascular surgery recommended.  Mild acute renal failure Creatinine  transiently worsened to 1.3, improved with IV fluids, 0.68 at the time of discharge.  History of essential hypertension. Monitor blood pressures closely. Restart home medications.  History of COPD Patient continues to smoke cigarettes. She was counseled. Continue inhalers and nebulizer treatments. Stable currently.  Normocytic anemia Drop in hemoglobin is likely dilutional. No evidence for overt bleeding. Continue to monitor closely. Hemoglobin at baseline at the time of discharge 11.3.    Day of Discharge BP (!) 167/81 (BP Location: Left Arm)   Pulse 84   Temp 97.9 F (36.6 C) (Oral)   Resp 17   Ht 5\' 1"  (1.549 m)   Wt 51.7 kg (113 lb 15.7 oz)   SpO2 98%   BMI 21.54 kg/m   Physical Exam: General: Alert and awake oriented x3 not in any acute distress. HEENT: anicteric sclera, pupils reactive to light and accommodation CVS: S1-S2 clear no murmur rubs or gallops Chest: clear to auscultation bilaterally, no wheezing rales or rhonchi Abdomen: soft nontender, nondistended, normal bowel sounds Extremities: no cyanosis, clubbing or edema noted bilaterally Neuro: Cranial nerves II-XII intact, no focal neurological deficits   The results of significant  diagnostics from this hospitalization (including imaging, microbiology, ancillary and laboratory) are listed below for reference.    LAB RESULTS: Basic Metabolic Panel:  Recent Labs Lab 04/11/16 0930 04/12/16 0256  NA 137 138  K 3.7 3.4*  CL 102 102  CO2 25 30  GLUCOSE 224* 97  BUN 8 <5*  CREATININE 1.32* 0.68  CALCIUM 8.5* 8.6*  MG 1.5* 2.0   Liver Function Tests:  Recent Labs Lab 04/07/16 2200  AST 17  ALT 9*  ALKPHOS 63  BILITOT 0.4  PROT 6.8  ALBUMIN 3.8    Recent Labs Lab 04/07/16 2200  LIPASE 23   No results for input(s): AMMONIA in the last 168 hours. CBC:  Recent Labs Lab 04/07/16 2200  04/10/16 0233 04/12/16 0256  WBC 7.8  < > 5.2 6.7  NEUTROABS 4.8  --   --   --   HGB 13.3  < >  10.2* 11.3*  HCT 40.7  < > 33.2* 35.7*  MCV 94.0  < > 94.9 93.0  PLT 213  < > 169 190  < > = values in this interval not displayed. Cardiac Enzymes: No results for input(s): CKTOTAL, CKMB, CKMBINDEX, TROPONINI in the last 168 hours. BNP: Invalid input(s): POCBNP CBG: No results for input(s): GLUCAP in the last 168 hours.  Significant Diagnostic Studies:  Ct Abdomen Pelvis W Contrast  Result Date: 04/08/2016 CLINICAL DATA:  70 year old female with abdominal pain and nausea. EXAM: CT ABDOMEN AND PELVIS WITH CONTRAST TECHNIQUE: Multidetector CT imaging of the abdomen and pelvis was performed using the standard protocol following bolus administration of intravenous contrast. CONTRAST:  127mL ISOVUE-300 IOPAMIDOL (ISOVUE-300) INJECTION 61% COMPARISON:  CT dated 03/02/2014 FINDINGS: Mild emphysematous changes of the right lower lobe. The visualized lung bases are otherwise clear. There is coronary vascular calcification. No intra-abdominal free air. Small free fluid within the pelvis. Small posterior diaphragmatic defects with herniation of the mesenteric fat, right greater left compatible with a Bochdalek's hernia. Stable appearing 18 mm ovoid hypodense lesion in the right lobe of the liver centrally, likely a cyst. Scattered smaller hepatic hypodense lesions are not well characterized but most likely represent cysts or hemangioma. Cholecystectomy. The pancreas, spleen, adrenal glands, kidneys, visualized ureters, and urinary bladder appear unremarkable. Hysterectomy. Oral contrast opacifies the stomach and multiple loops of small bowel. Multiple fluid-filled loop of bowel with mild associated inflammation noted in the pelvis. These loops of bowel measure up to 2.5 cm in diameter. There is a point of transition in the posterior pelvis (series 2, image 70) concerning for focal area of adhesion with possible low grade or early small bowel obstruction. The distal small bowel loops and terminal ileum are  collapsed. There is postsurgical changes of partial sigmoid resection with anastomotic suture. Appendectomy. There is advanced aortoiliac atherosclerotic disease. The origins of the celiac axis, SMA, IMA as well as the origins of the renal arteries appear patent. No portal venous gas identified. There is no adenopathy. The abdominal wall soft tissues appear unremarkable. Midline vertical anterior pelvic wall incisional scar noted. There is osteopenia with degenerative changes of the spine. No acute fracture. IMPRESSION: Findings concerning for a low grade or early distal small bowel obstruction with transition zone in the posterior pelvis likely related to underlying adhesion. Clinical correlation and follow-up recommended. Electronically Signed   By: Anner Crete M.D.   On: 04/08/2016 02:14    2D ECHO:   Disposition and Follow-up: Discharge Instructions    Diet - low sodium heart healthy  Complete by:  As directed    Discharge instructions    Complete by:  As directed    Soft diet for next 3-4 days.   Increase activity slowly    Complete by:  As directed        DISPOSITION: [place], DO. Schedule an appointment as soon as possible for a visit in 2 week(s).   Specialty:  Family Medicine Contact information: [address] [place] [address] [phone]            Time spent on Discharge: 65mins   Signed:   RAI,RIPUDEEP M.D. [facility] 04/12/2016, 3:10 PM Pager: [phone]

## 2016-04-12 NOTE — Progress Notes (Signed)
VASCULAR LAB PRELIMINARY  PRELIMINARY  PRELIMINARY  PRELIMINARY  Bilateral lower extremity venous duplex completed.    Preliminary report:  Bilateral:  No evidence of DVT, superficial thrombosis, or Baker's Cyst.   Herschell Virani, RVS 04/12/2016, 1:57 PM

## 2016-04-12 NOTE — Progress Notes (Signed)
VASCULAR LAB PRELIMINARY  ARTERIAL  ABI completed:    RIGHT    LEFT    PRESSURE WAVEFORM  PRESSURE WAVEFORM  BRACHIAL 164 Triphasic BRACHIAL 175 Triphasic  DP 185 Triphasic DP 125 Biphasic  PT 184 Triphasic PT 126 Triphasic    RIGHT LEFT  ABI 1.06 0.72   ABIs and Doppler waveforms are within normal limits at rest on the right. Left ABIs indicate a moderate reduction in arterial flow with Doppler waveforms within normal limits in the PTA and slightly abnormal in the DPA at rest  Leah [name], RVS 04/12/2016, 2:35 PM

## 2016-04-19 ENCOUNTER — Ambulatory Visit: Payer: BLUE CROSS/BLUE SHIELD | Admitting: Medical

## 2016-05-23 ENCOUNTER — Other Ambulatory Visit: Payer: Self-pay

## 2016-05-23 DIAGNOSIS — E785 Hyperlipidemia, unspecified: Secondary | ICD-10-CM

## 2016-05-23 MED ORDER — EZETIMIBE 10 MG PO TABS: 10.0000 mg | ORAL_TABLET | Freq: Every day | ORAL | 1 refills | Status: DC

## 2016-06-21 ENCOUNTER — Other Ambulatory Visit: Payer: Self-pay | Admitting: Family Medicine

## 2016-06-21 DIAGNOSIS — E785 Hyperlipidemia, unspecified: Secondary | ICD-10-CM

## 2016-07-09 ENCOUNTER — Other Ambulatory Visit: Payer: Self-pay | Admitting: Medical

## 2016-07-09 DIAGNOSIS — E785 Hyperlipidemia, unspecified: Secondary | ICD-10-CM

## 2016-07-10 ENCOUNTER — Ambulatory Visit (INDEPENDENT_AMBULATORY_CARE_PROVIDER_SITE_OTHER): Payer: BLUE CROSS/BLUE SHIELD | Admitting: Medical

## 2016-07-10 ENCOUNTER — Encounter: Payer: Self-pay | Admitting: Medical

## 2016-07-10 VITALS — BP 146/78 | HR 64 | Temp 97.7°F | Ht 61.5 in | Wt 114.2 lb

## 2016-07-10 DIAGNOSIS — I10 Essential (primary) hypertension: Secondary | ICD-10-CM | POA: Diagnosis not present

## 2016-07-10 DIAGNOSIS — M25522 Pain in left elbow: Secondary | ICD-10-CM | POA: Diagnosis not present

## 2016-07-10 DIAGNOSIS — D1722 Benign lipomatous neoplasm of skin and subcutaneous tissue of left arm: Secondary | ICD-10-CM | POA: Diagnosis not present

## 2016-07-10 DIAGNOSIS — H9202 Otalgia, left ear: Secondary | ICD-10-CM

## 2016-07-10 DIAGNOSIS — J0110 Acute frontal sinusitis, unspecified: Secondary | ICD-10-CM

## 2016-07-10 DIAGNOSIS — Z23 Encounter for immunization: Secondary | ICD-10-CM | POA: Diagnosis not present

## 2016-07-10 LAB — EKG 12-LEAD

## 2016-07-10 MED ORDER — ATORVASTATIN CALCIUM 40 MG PO TABS: 40.0000 mg | ORAL_TABLET | Freq: Every day | ORAL | 2 refills | Status: DC

## 2016-07-10 MED ORDER — AZELASTINE HCL 0.1 % NA SOLN: 2.0000 | Freq: Two times a day (BID) | NASAL | 3 refills | Status: DC

## 2016-07-10 MED ORDER — AZITHROMYCIN 250 MG PO TABS: ORAL_TABLET | ORAL | 0 refills | Status: DC

## 2016-07-10 MED ORDER — LISINOPRIL 20 MG PO TABS
20.0000 mg | ORAL_TABLET | Freq: Every day | ORAL | 3 refills | Status: DC
Start: 2016-07-10 — End: 2017-10-18

## 2016-07-10 NOTE — Progress Notes (Signed)
Subjective:    Patient ID: Leah [name], female    DOB: Aug 14, 1945, 70 y.o.   MRN: [id number]  HPI  Pt in for follow up.  She went to [facility]. Pt went to the ED she had a work up for high bp. Her bp was 200/100. Then taken to [place] had work up. Pt states had ekg, ua, cxr and blood work. Then they discharged with no med changes. She state say of ED visit ha, chills and fatigued. But no longer has that now.   Pt is just on lisinopril 10 mg a day.   In August I had advised her to take 20 mg of lisinopril  If bp readings remained high. Pt can't remember but she apparently did not increase her 10 mg dose to 20 mg.   Since dc from the hospital her bp have been 140/80/ 161/78, 158/81 and 158/81. No recent cardiac or neurologic signs or symptoms.  Pt also notes one week of on and off left arm pain. Source seem proximal bicep. Small lump felt that is tender and radiates down her arm to her hand. No jaw pain. No sob. No chest pain.  Also left ear pain on and off transient pain. No obvious uri type signs or symptoms. No sinus pressure.         Review of Systems  Constitutional: Negative for chills and fatigue.  HENT: Positive for ear pain. Negative for congestion, drooling, hearing loss, nosebleeds, postnasal drip, sinus pain, sinus pressure and sore throat.   Respiratory: Negative for cough, choking, chest tightness, shortness of breath and wheezing.   Cardiovascular: Negative for chest pain and palpitations.  Gastrointestinal: Negative for abdominal pain.  Skin: Negative for rash.  Neurological: Negative for dizziness, syncope, speech difficulty, weakness, numbness and headaches.  Hematological: Negative for adenopathy. Does not bruise/bleed easily.  Psychiatric/Behavioral: Negative for behavioral problems and confusion.    Past Medical History:  Diagnosis Date  . Anxiety   . Barrett esophagus   . Bladder pain   . Chronic bronchitis ([place])    per pt  last episode july 2016  . Complication of anesthesia    slow to wake  . Depression   . Diverticulosis   . Frequency of urination   . GERD (gastroesophageal reflux disease)   . Hiatal hernia   . History of adenomatous polyp of colon    tubular adenoma  . History of chronic gastritis   . History of diverticulitis of colon   . History of esophageal dilatation    for stricture  . History of esophageal spasm   . Hyperlipidemia   . Hypertension   . IBS (irritable bowel syndrome)   . Interstitial cystitis   . Productive cough    intermittant  . Smokers' cough ([place])   . Urgency of urination      Social History   Social History  . Marital status: Married    Spouse name: N/A  . Number of children: N/A  . Years of education: N/A   Occupational History  . Not on file.   Social History Main Topics  . Smoking status: Current Every Day Smoker    Packs/day: 1.00    Years: 47.00    Types: Cigarettes  . Smokeless tobacco: Never Used     Comment: form given 02/17/16  . Alcohol use No  . Drug use: No  . Sexual activity: Not Currently    Birth control/ protection: Post-menopausal   Other Topics  Concern  . Not on file   Social History Narrative  . No narrative on file    Past Surgical History:  Procedure Laterality Date  . ABDOMINAL HYSTERECTOMY  1975  . ANTERIOR CERVICAL DECOMP/DISCECTOMY FUSION N/A 09/07/2014   Procedure: Exploration of Fusion and Removal of Anterior Cervical Hematoma;  Surgeon: Newman Pies, MD;  Location: [facility] NEURO ORS;  Service: Neurosurgery;  Laterality: N/A;  . ANTERIOR FUSION CERVICAL SPINE  09/03/2014   C5 -- C7  . APPENDECTOMY    . CARPAL TUNNEL RELEASE Right 1993  . COLON SURGERY    . COLONOSCOPY  last one 04-30-2013  . CYSTO WITH HYDRODISTENSION N/A 06/08/2015   Procedure: CYSTOSCOPY/HYDRODISTENSION INSTILLATION OF MARCAINE AND PYRIDIUM;  Surgeon: Irine Seal, MD;  Location: [facility];  Service: Urology;  Laterality: N/A;  .  CYSTO/  HOD/  BLADDER BX  1990's  . ESOPHAGOGASTRODUODENOSCOPY  last one 09-11-2012  . LAPAROSCOPIC APPENDECTOMY N/A 09/21/2013   Procedure: APPENDECTOMY LAPAROSCOPIC;  Surgeon: Rolm Bookbinder, MD;  Location: [facility];  Service: General;  Laterality: N/A;  . LAPAROSCOPIC CHOLECYSTECTOMY  2002  . LAPAROSCOPY SIGMOID COLECTOMY  10-05-2008   diverticulitis  . MICROLARYNGOSCOPY  09-27-2006   w/ true vocal cord stripping  and bilateral bx's of lesion's (benign)  . SINUS SURGERY WITH [place]  . [place]  . TONSILLECTOMY AND ADENOIDECTOMY  1964    Family History  Problem Relation Age of Onset  . Pancreatic cancer Mother   . Colon polyps Mother   . Pancreatic cancer Father   . Diabetes Father   . Heart disease Father   . Leukemia Father   . Prostate cancer Father   . Hypertension Father   . Colon cancer Neg Hx     Allergies  Allergen Reactions  . Amoxicillin-Pot Clavulanate Shortness Of Breath, Itching and Swelling  . Avelox [Moxifloxacin Hcl In Nacl] Swelling    Swelling, itching and shortness of breath  . Ciprofloxacin Shortness Of Breath, Itching and Swelling  . Keflex [Cephalexin] Shortness Of Breath and Swelling  . Moxifloxacin Shortness Of Breath, Itching and Swelling  . Penicillins Shortness Of Breath, Itching and Swelling  . Sulfa Antibiotics Shortness Of Breath, Itching and Swelling  . Prednisone Other (See Comments)    GI irritation; is able to tolerate injections  . Acyclovir And Related Other (See Comments)    Pt does not recall this reaction    Current Outpatient Prescriptions on File Prior to Visit  Medication Sig Dispense Refill  . albuterol (PROVENTIL) (2.5 MG/3ML) 0.083% nebulizer solution Inhale 2.5 mg into the lungs every 4 (four) hours as needed for wheezing or shortness of breath. q 4-6 hours as needed  0  . aspirin EC 81 MG tablet Take 1 tablet (81 mg total) by mouth daily. Over the counter    . atorvastatin (LIPITOR) 40  MG tablet TAKE 1 TABLET (40 MG TOTAL) BY MOUTH DAILY AT 6 PM. 30 tablet 2  . cyclobenzaprine (FLEXERIL) 5 MG tablet Take 1 tablet (5 mg total) by mouth 3 (three) times daily. 90 tablet 1  . dicyclomine (BENTYL) 10 MG capsule Take 1 capsule by mouth three times daily as needed for cramping and spasms. 60 capsule 3  . esomeprazole (NEXIUM) 40 MG capsule Take 1 capsule (40 mg total) by mouth 2 (two) times daily before a meal. 180 capsule 3  . estradiol (CLIMARA - DOSED IN MG/24 HR) 0.1 mg/24hr patch Place 0.1 mg onto the skin  every Monday.  0  . ezetimibe (ZETIA) 10 MG tablet Take 1 tablet (10 mg total) by mouth daily. 90 tablet 1  . fluticasone (FLONASE) 50 MCG/ACT nasal spray Place 2 sprays into both nostrils daily. 16 g 6  . ibuprofen (ADVIL,MOTRIN) 200 MG tablet Take 400 mg by mouth every 6 (six) hours as needed (pain).    Marland Kitchen lisinopril (PRINIVIL,ZESTRIL) 20 MG tablet Take 1 tablet (20 mg total) by mouth daily. 30 tablet 2  . LORazepam (ATIVAN) 0.5 MG tablet Take 0.5 mg by mouth at bedtime.   5  . mometasone-formoterol (DULERA) 200-5 MCG/ACT AERO 2 puffs bid (Patient taking differently: Inhale 2 puffs into the lungs 2 (two) times daily as needed for wheezing or shortness of breath. ) 1 Inhaler 2  . polyethylene glycol (MIRALAX / GLYCOLAX) packet Take 17 g by mouth daily as needed for moderate constipation. 14 each 0  . senna-docusate (SENOKOT-S) 8.6-50 MG tablet Take 1 tablet by mouth at bedtime. 30 tablet 0  . sertraline (ZOLOFT) 50 MG tablet Take 50 mg by mouth at bedtime.    . sodium chloride (OCEAN) 0.65 % SOLN nasal spray Place 1 spray into both nostrils daily as needed for congestion.    Marland Kitchen tiotropium (SPIRIVA HANDIHALER) 18 MCG inhalation capsule Place 1 capsule (18 mcg total) into inhaler and inhale daily. (Patient taking differently: Place 18 mcg into inhaler and inhale daily as needed (bronchitis). ) 30 capsule 12   No current facility-administered medications on file prior to visit.      BP (!) 144/70 (BP Location: Right Arm, Patient Position: Sitting, Cuff Size: Normal) Comment: Pt has not taking BP meds this am.  Pulse 64   Temp 97.7 F (36.5 C) (Oral)   Ht 5' 1.5" (1.562 m)   Wt 114 lb 3.2 oz (51.8 kg)   SpO2 96%   BMI 21.23 kg/m       Objective:   Physical Exam  General Mental Status- Alert. General Appearance- Not in acute distress.   Skin General: Color- Normal Color. Moisture- Normal Moisture.  Neck Carotid Arteries- Normal color. Moisture- Normal Moisture. No carotid bruits. No JVD.  Chest and Lung Exam Auscultation: Breath Sounds:-Normal.  Cardiovascular Auscultation:Rythm- Regular. Murmurs & Other Heart Sounds:Auscultation of the heart reveals- No Murmurs.  Abdomen Inspection:-Inspeection Normal. Palpation/Percussion:Note:No mass. Palpation and Percussion of the abdomen reveal- Non Tender, Non Distended + BS, no rebound or guarding.   Neurologic Cranial Nerve exam:- CN III-XII intact(No nystagmus), symmetric smile. Strength:- 5/5 equal and symmetric strength both upper and lower extremities.   HEENT Head- Normal. Ear Auditory Canal - Left- Normal. Right - Normal.Tympanic Membrane- Left- Mild central redness small portion mid center.  Right- Normal. Eye Sclera/Conjunctiva- Left- Normal. Right- Normal. Nose & Sinuses Nasal Mucosa- Left-  Boggy and Congested. Right-  Boggy and  Congested.Bilateral maxillary and frontal sinus pressure. Mouth & Throat Lips: Upper Lip- Normal: no dryness, cracking, pallor, cyanosis, or vesicular eruption. Lower Lip-Normal: no dryness, cracking, pallor, cyanosis or vesicular eruption. Buccal Mucosa- Bilateral- No Aphthous ulcers. Oropharynx- No Discharge or Erythema. Tonsils: Characteristics- Bilateral- No Erythema or Congestion. Size/Enlargement- Bilateral- No enlargement. Discharge- bilateral-None.  Left upper extremity- proximal aspect. Small lipoma vs ganglion cyst.       .        Assessment & Plan:  For your htn. I want you to take lisinopril 20 mg a day. This should give you tighter control. Check bp at home daily and follow up be check in 2  weeks.  For arm pain we did ekg for safety sake(normal sinus rhythm only mild bradycardia but she was resting for some time).But your pain may be from lipoma region since you have direct pain in this area and originate from this area. Will refer you to general surgeon.  For left ear pain may be ear pressure related. Will rx astlein(preferred steroid but hx of reaction to prednisone on chart review). But if ear pain worsens then azithromycin.  If any worse arm pain or accompanying chest pain/cardiac symptoms then ED evaluation.  Follow up in 2 weeks or as needed  Adreena Willits, Percell Miller, [employer]

## 2016-07-10 NOTE — Patient Instructions (Addendum)
For your htn. I want you to take lisinopril 20 mg a day. This should give you tighter control. Check bp at home daily  and follow up be check in 2 weeks.  For arm pain we did ekg for safety sake(ekg looked normal)..But your pain may be from lipoma region since you have direct pain in this area and originate from this area. Will refer you to general surgeon.  For left ear pain may be ear pressure related. Will rx astlein(preferred steroid but hx of reaction to prednisone on chart review). But if ear pain worsens then azithromycin.  If any worse arm pain or accompanying chest pain/cardiac symptoms then ED evaluation.  Follow up in 2 weeks or as needed

## 2016-07-12 ENCOUNTER — Ambulatory Visit (INDEPENDENT_AMBULATORY_CARE_PROVIDER_SITE_OTHER): Payer: BLUE CROSS/BLUE SHIELD | Admitting: Medical

## 2016-07-12 ENCOUNTER — Encounter: Payer: Self-pay | Admitting: Medical

## 2016-07-12 ENCOUNTER — Telehealth: Payer: Self-pay | Admitting: Family Medicine

## 2016-07-12 ENCOUNTER — Ambulatory Visit (HOSPITAL_BASED_OUTPATIENT_CLINIC_OR_DEPARTMENT_OTHER): Payer: BLUE CROSS/BLUE SHIELD

## 2016-07-12 VITALS — BP 102/70 | HR 78 | Temp 98.3°F | Ht 61.5 in | Wt 112.6 lb

## 2016-07-12 DIAGNOSIS — L089 Local infection of the skin and subcutaneous tissue, unspecified: Secondary | ICD-10-CM | POA: Diagnosis not present

## 2016-07-12 DIAGNOSIS — T7840XA Allergy, unspecified, initial encounter: Secondary | ICD-10-CM | POA: Diagnosis not present

## 2016-07-12 DIAGNOSIS — M25521 Pain in right elbow: Secondary | ICD-10-CM

## 2016-07-12 LAB — CBC WITH DIFFERENTIAL/PLATELET
Basophils Absolute: 0 10*3/uL (ref 0.0–0.1)
Basophils Relative: 0.6 % (ref 0.0–3.0)
EOS PCT: 9.2 % — AB (ref 0.0–5.0)
Eosinophils Absolute: 0.5 10*3/uL (ref 0.0–0.7)
HEMATOCRIT: 41.4 % (ref 36.0–46.0)
HEMOGLOBIN: 13.8 g/dL (ref 12.0–15.0)
LYMPHS PCT: 23.3 % (ref 12.0–46.0)
Lymphs Abs: 1.3 10*3/uL (ref 0.7–4.0)
MCHC: 33.2 g/dL (ref 30.0–36.0)
MCV: 88.7 fl (ref 78.0–100.0)
MONO ABS: 0.4 10*3/uL (ref 0.1–1.0)
MONOS PCT: 6.6 % (ref 3.0–12.0)
Neutro Abs: 3.2 10*3/uL (ref 1.4–7.7)
Neutrophils Relative %: 60.3 % (ref 43.0–77.0)
Platelets: 215 10*3/uL (ref 150.0–400.0)
RBC: 4.67 Mil/uL (ref 3.87–5.11)
RDW: 14.5 % (ref 11.5–15.5)
WBC: 5.4 10*3/uL (ref 4.0–10.5)

## 2016-07-12 MED ORDER — METHYLPREDNISOLONE ACETATE 40 MG/ML IJ SUSP
40.0000 mg | Freq: Once | INTRAMUSCULAR | Status: AC
  Administered 2016-07-12: 40 mg via INTRAMUSCULAR

## 2016-07-12 MED ORDER — DOXYCYCLINE HYCLATE 100 MG PO TABS
100.0000 mg | ORAL_TABLET | Freq: Two times a day (BID) | ORAL | 0 refills | Status: DC
Start: 2016-07-12 — End: 2017-02-20

## 2016-07-12 NOTE — Telephone Encounter (Signed)
Patient called stating that she received a flu shot on Monday and it got swollen and red yesterday, the redness and swelling is gone but now she is running a fever. Appointment scheduled for 07/12/16 at 10:00 with Percell Miller.

## 2016-07-12 NOTE — Progress Notes (Signed)
Subjective:    Patient ID: KARENDA [name], female    DOB: 02/28/1946, 70 y.o.   MRN: [id number]  HPI  Pt in for red swollen sore rt shoulder and rt bicep area. This occurred about 24 hours after flu vaccine. Pt has had mild chills and low grade fever of 100.6 last night. Pt sweaty this am.  Redness of shoulder decreased today but distal bicep still red and tender.  Pt in past never had any reaction or complications to flu vaccine.  Mild itch to the arm/shoulder over injection site.    Review of Systems  Constitutional: Positive for fever. Negative for chills and fatigue.  Respiratory: Negative for cough, chest tightness, shortness of breath and wheezing.   Cardiovascular: Negative for chest pain and palpitations.  Musculoskeletal:       Rt arm pain  Skin:       Distal redness and swelling bicep area.  Mild itch to arm  Neurological: Negative for dizziness and headaches.  Hematological: Negative for adenopathy. Does not bruise/bleed easily.  Psychiatric/Behavioral: Negative for behavioral problems and confusion.   Past Medical History:  Diagnosis Date  . Anxiety   . Barrett esophagus   . Bladder pain   . Chronic bronchitis ([place])    per pt last episode july 2016  . Complication of anesthesia    slow to wake  . Depression   . Diverticulosis   . Frequency of urination   . GERD (gastroesophageal reflux disease)   . Hiatal hernia   . History of adenomatous polyp of colon    tubular adenoma  . History of chronic gastritis   . History of diverticulitis of colon   . History of esophageal dilatation    for stricture  . History of esophageal spasm   . Hyperlipidemia   . Hypertension   . IBS (irritable bowel syndrome)   . Interstitial cystitis   . Productive cough    intermittant  . Smokers' cough ([place])   . Urgency of urination      Social History   Social History  . Marital status: Married    Spouse name: N/A  . Number of children: N/A  . Years of  education: N/A   Occupational History  . Not on file.   Social History Main Topics  . Smoking status: Current Every Day Smoker    Packs/day: 1.00    Years: 47.00    Types: Cigarettes  . Smokeless tobacco: Never Used     Comment: form given 02/17/16  . Alcohol use No  . Drug use: No  . Sexual activity: Not Currently    Birth control/ protection: Post-menopausal   Other Topics Concern  . Not on file   Social History Narrative  . No narrative on file    Past Surgical History:  Procedure Laterality Date  . ABDOMINAL HYSTERECTOMY  1975  . ANTERIOR CERVICAL DECOMP/DISCECTOMY FUSION N/A 09/07/2014   Procedure: Exploration of Fusion and Removal of Anterior Cervical Hematoma;  Surgeon: Newman Pies, MD;  Location: [facility] NEURO ORS;  Service: Neurosurgery;  Laterality: N/A;  . ANTERIOR FUSION CERVICAL SPINE  09/03/2014   C5 -- C7  . APPENDECTOMY    . CARPAL TUNNEL RELEASE Right 1993  . COLON SURGERY    . COLONOSCOPY  last one 04-30-2013  . CYSTO WITH HYDRODISTENSION N/A 06/08/2015   Procedure: CYSTOSCOPY/HYDRODISTENSION INSTILLATION OF MARCAINE AND PYRIDIUM;  Surgeon: Irine Seal, MD;  Location: [facility];  Service: Urology;  Laterality: N/A;  .  CYSTO/  HOD/  BLADDER BX  1990's  . ESOPHAGOGASTRODUODENOSCOPY  last one 09-11-2012  . LAPAROSCOPIC APPENDECTOMY N/A 09/21/2013   Procedure: APPENDECTOMY LAPAROSCOPIC;  Surgeon: Rolm Bookbinder, MD;  Location: [place];  Service: General;  Laterality: N/A;  . LAPAROSCOPIC CHOLECYSTECTOMY  2002  . LAPAROSCOPY SIGMOID COLECTOMY  10-05-2008   diverticulitis  . MICROLARYNGOSCOPY  09-27-2006   w/ true vocal cord stripping  and bilateral bx's of lesion's (benign)  . SINUS SURGERY WITH [place]  . [place]  . TONSILLECTOMY AND ADENOIDECTOMY  1964    Family History  Problem Relation Age of Onset  . Pancreatic cancer Mother   . Colon polyps Mother   . Pancreatic cancer Father   . Diabetes  Father   . Heart disease Father   . Leukemia Father   . Prostate cancer Father   . Hypertension Father   . Colon cancer Neg Hx     Allergies  Allergen Reactions  . Amoxicillin-Pot Clavulanate Shortness Of Breath, Itching and Swelling  . Avelox [Moxifloxacin Hcl In Nacl] Swelling    Swelling, itching and shortness of breath  . Ciprofloxacin Shortness Of Breath, Itching and Swelling  . Keflex [Cephalexin] Shortness Of Breath and Swelling  . Moxifloxacin Shortness Of Breath, Itching and Swelling  . Penicillins Shortness Of Breath, Itching and Swelling  . Sulfa Antibiotics Shortness Of Breath, Itching and Swelling  . Prednisone Other (See Comments)    GI irritation; is able to tolerate injections  . Acyclovir And Related Other (See Comments)    Pt does not recall this reaction    Current Outpatient Prescriptions on File Prior to Visit  Medication Sig Dispense Refill  . albuterol (PROVENTIL) (2.5 MG/3ML) 0.083% nebulizer solution Inhale 2.5 mg into the lungs every 4 (four) hours as needed for wheezing or shortness of breath. q 4-6 hours as needed  0  . aspirin EC 81 MG tablet Take 1 tablet (81 mg total) by mouth daily. Over the counter    . atorvastatin (LIPITOR) 40 MG tablet Take 1 tablet (40 mg total) by mouth daily at 6 PM. 30 tablet 2  . azelastine (ASTELIN) 0.1 % nasal spray Place 2 sprays into both nostrils 2 (two) times daily. Use in each nostril as directed 30 mL 3  . azithromycin (ZITHROMAX) 250 MG tablet Take 2 tablets by mouth on day 1, followed by 1 tablet by mouth daily for 4 days. 6 tablet 0  . cyclobenzaprine (FLEXERIL) 5 MG tablet Take 1 tablet (5 mg total) by mouth 3 (three) times daily. 90 tablet 1  . dicyclomine (BENTYL) 10 MG capsule Take 1 capsule by mouth three times daily as needed for cramping and spasms. 60 capsule 3  . esomeprazole (NEXIUM) 40 MG capsule Take 1 capsule (40 mg total) by mouth 2 (two) times daily before a meal. 180 capsule 3  . estradiol (CLIMARA  - DOSED IN MG/24 HR) 0.1 mg/24hr patch Place 0.1 mg onto the skin every Monday.  0  . ezetimibe (ZETIA) 10 MG tablet Take 1 tablet (10 mg total) by mouth daily. 90 tablet 1  . fluticasone (FLONASE) 50 MCG/ACT nasal spray Place 2 sprays into both nostrils daily. 16 g 6  . ibuprofen (ADVIL,MOTRIN) 200 MG tablet Take 400 mg by mouth every 6 (six) hours as needed (pain).    Marland Kitchen lisinopril (PRINIVIL,ZESTRIL) 20 MG tablet TAKE 1 TABLET (20 MG TOTAL) BY MOUTH DAILY. 30 tablet 2  . lisinopril (PRINIVIL,ZESTRIL) 20 MG  tablet Take 1 tablet (20 mg total) by mouth daily. 30 tablet 3  . LORazepam (ATIVAN) 0.5 MG tablet Take 0.5 mg by mouth at bedtime.   5  . mometasone-formoterol (DULERA) 200-5 MCG/ACT AERO 2 puffs bid (Patient taking differently: Inhale 2 puffs into the lungs 2 (two) times daily as needed for wheezing or shortness of breath. ) 1 Inhaler 2  . polyethylene glycol (MIRALAX / GLYCOLAX) packet Take 17 g by mouth daily as needed for moderate constipation. 14 each 0  . senna-docusate (SENOKOT-S) 8.6-50 MG tablet Take 1 tablet by mouth at bedtime. 30 tablet 0  . sertraline (ZOLOFT) 50 MG tablet Take 50 mg by mouth at bedtime.    . sodium chloride (OCEAN) 0.65 % SOLN nasal spray Place 1 spray into both nostrils daily as needed for congestion.    Marland Kitchen tiotropium (SPIRIVA HANDIHALER) 18 MCG inhalation capsule Place 1 capsule (18 mcg total) into inhaler and inhale daily. (Patient taking differently: Place 18 mcg into inhaler and inhale daily as needed (bronchitis). ) 30 capsule 12   No current facility-administered medications on file prior to visit.     BP 102/70 (BP Location: Left Arm, Patient Position: Sitting, Cuff Size: Normal)   Pulse 78   Temp 98.3 F (36.8 C) (Oral)   Ht 5' 1.5" (1.562 m)   Wt 112 lb 9.6 oz (51.1 kg)   SpO2 98%   BMI 20.93 kg/m       Objective:   Physical Exam  General- No acute distress. Pleasant patient. Neck- Full range of motion, no jvd Lungs- Clear, even and  unlabored. Heart- regular rate and rhythm. Neurologic- CNII- XII grossly intact.  Rt shoulder- on swelling presently but faint warmth. Rt arm- bicep area distal aspect is swollen and red. Tender. Over medial arm no bruit. No regional lymph nodes felt.         Assessment & Plan:  For allergic reaction we gave you depomedrol 40 mg im. Use claritin otc since benadryl oversedates you. Appears to have had reaction to flu vaccine with secondary infection as well.  For likely skin infection stop azithromycin and use doxycycline antibiotic.  After reviewing your arm swelling I do want to get cbc to assess wbc and also get upper ext [place].([place] to make sure no dvt)  Follow up this Friday before weekend or on Monday.(friday if any concern if not improving)  In event of any dramatic worsening before Friday then ED evaluation.  [place] study is scheduled today 3:30. Pt states can't do [place] today. So advised schedule for tomorrow.  Ishaaq Penna, Percell Miller, PA-C

## 2016-07-12 NOTE — Progress Notes (Signed)
Pre visit review using our clinic review tool, if applicable. No additional management support is needed unless otherwise documented below in the visit note. 

## 2016-07-12 NOTE — Telephone Encounter (Signed)
Pt seen by Pa, pt instructed to follow up in 2 days. LB

## 2016-07-12 NOTE — Patient Instructions (Addendum)
For allergic reaction we gave you depomedrol 40 mg im. Use claritin otc since benadryl oversedates you. Appears to have had reaction to flu vaccine with secondary infection as well.  For likely skin infection stop azithromycin and use doxycycline antibiotic.  After reviewing your arm swelling I do want to get cbc to assess wbc and also get upper ext [place].([place] to make sure no dvt)  Follow up this Friday before weekend or on Monday.(friday if any concern if not improving)  In event of any dramatic worsening before Friday then ED evaluation.  [place] study is scheduled today 3:30.

## 2016-07-13 ENCOUNTER — Ambulatory Visit (HOSPITAL_BASED_OUTPATIENT_CLINIC_OR_DEPARTMENT_OTHER)
Admission: RE | Admit: 2016-07-13 | Discharge: 2016-07-13 | Disposition: A | Discharge status: Home / Self Care | Payer: BLUE CROSS/BLUE SHIELD | Source: Ambulatory Visit | Attending: Medical | Admitting: Medical

## 2016-07-13 DIAGNOSIS — M25521 Pain in right elbow: Secondary | ICD-10-CM | POA: Insufficient documentation

## 2016-08-01 ENCOUNTER — Telehealth: Payer: Self-pay | Admitting: Medical

## 2016-08-01 LAB — HM MAMMOGRAPHY

## 2016-08-01 MED ORDER — FLUTICASONE-SALMETEROL 100-50 MCG/DOSE IN AEPB: 1.0000 | INHALATION_SPRAY | Freq: Two times a day (BID) | RESPIRATORY_TRACT | 3 refills | Status: DC

## 2016-08-01 NOTE — Telephone Encounter (Signed)
Notify pt that I sent in advair to her pharmacy since per insurance they won't be covering [place] in 2018. So when runs out of dulera switch.

## 2016-08-01 NOTE — Telephone Encounter (Signed)
Called and left a message for call back  

## 2016-08-03 ENCOUNTER — Ambulatory Visit: Payer: BLUE CROSS/BLUE SHIELD | Admitting: Medical

## 2016-08-03 NOTE — Telephone Encounter (Signed)
Called patient.  Phone just rang.  No answer. Unable to leave voice message.

## 2016-08-07 ENCOUNTER — Encounter: Payer: Self-pay | Admitting: Family Medicine

## 2016-11-06 ENCOUNTER — Telehealth: Payer: Self-pay | Admitting: Family Medicine

## 2016-11-06 ENCOUNTER — Other Ambulatory Visit: Payer: Self-pay | Admitting: Family Medicine

## 2016-11-06 DIAGNOSIS — E785 Hyperlipidemia, unspecified: Secondary | ICD-10-CM

## 2016-11-06 NOTE — Telephone Encounter (Signed)
Pt needs ov. 

## 2016-11-06 NOTE — Telephone Encounter (Signed)
Patient called seeking appointment with provider due to her increased BP for several days. Informed patient that she needed to speak directly with a nurse to advise her. Patient stated that BP is 156/82 this morning and is causing headaches. Call was disconnected. Attempted to contact patient at both home and mobile number with no answer. Left message on voicemail for patient to call the office back so that she can speak with a nurse to get assistance.

## 2016-11-06 NOTE — Telephone Encounter (Signed)
Dr. Lowne FYI 

## 2016-11-07 ENCOUNTER — Encounter: Payer: Self-pay | Admitting: Family Medicine

## 2016-11-07 ENCOUNTER — Ambulatory Visit (INDEPENDENT_AMBULATORY_CARE_PROVIDER_SITE_OTHER): Payer: BLUE CROSS/BLUE SHIELD | Admitting: Family Medicine

## 2016-11-07 VITALS — BP 110/62 | HR 84 | Temp 98.2°F | Resp 16 | Ht 61.5 in | Wt 112.2 lb

## 2016-11-07 DIAGNOSIS — E782 Mixed hyperlipidemia: Secondary | ICD-10-CM

## 2016-11-07 DIAGNOSIS — I10 Essential (primary) hypertension: Secondary | ICD-10-CM | POA: Diagnosis not present

## 2016-11-07 DIAGNOSIS — Z889 Allergy status to unspecified drugs, medicaments and biological substances status: Secondary | ICD-10-CM | POA: Diagnosis not present

## 2016-11-07 DIAGNOSIS — J441 Chronic obstructive pulmonary disease with (acute) exacerbation: Secondary | ICD-10-CM | POA: Diagnosis not present

## 2016-11-07 DIAGNOSIS — R252 Cramp and spasm: Secondary | ICD-10-CM

## 2016-11-07 MED ORDER — TIZANIDINE HCL 4 MG PO CAPS: 4.0000 mg | ORAL_CAPSULE | Freq: Three times a day (TID) | ORAL | 0 refills | Status: DC | PRN

## 2016-11-07 MED ORDER — MOMETASONE FURO-FORMOTEROL FUM 200-5 MCG/ACT IN AERO: INHALATION_SPRAY | RESPIRATORY_TRACT | 2 refills | Status: DC

## 2016-11-07 MED ORDER — AZELASTINE HCL 0.1 % NA SOLN: 2.0000 | Freq: Two times a day (BID) | NASAL | 3 refills | Status: DC

## 2016-11-07 MED ORDER — TIOTROPIUM BROMIDE MONOHYDRATE 18 MCG IN CAPS: 18.0000 ug | ORAL_CAPSULE | Freq: Every day | RESPIRATORY_TRACT | 12 refills | Status: DC

## 2016-11-07 NOTE — Telephone Encounter (Signed)
appt scheduled today with PCP 11/07/16

## 2016-11-07 NOTE — Patient Instructions (Signed)

## 2016-11-07 NOTE — Progress Notes (Signed)
Pre visit review using our clinic review tool, if applicable. No additional management support is needed unless otherwise documented below in the visit note. 

## 2016-11-07 NOTE — Progress Notes (Signed)
Subjective:  I acted as a Education administrator for Dr. Royden Purl, LPN    Patient ID: Leah [name] Dates, female    DOB: 1946/03/14, 71 y.o.   MRN: [id number]  Chief Complaint  Patient presents with  . Hypertension    HPI  Patient is in today for elevated b/p. Patient report she keep track of b/p at home and the reading are 150's/ 80's. Patient report she is experiencing headaches for about a week.  Patient Care Team: [name], DO as PCP - General   Past Medical History:  Diagnosis Date  . Anxiety   . Barrett esophagus   . Bladder pain   . Chronic bronchitis ([place])    per pt last episode july 2016  . Complication of anesthesia    slow to wake  . Depression   . Diverticulosis   . Frequency of urination   . GERD (gastroesophageal reflux disease)   . Hiatal hernia   . History of adenomatous polyp of colon    tubular adenoma  . History of chronic gastritis   . History of diverticulitis of colon   . History of esophageal dilatation    for stricture  . History of esophageal spasm   . Hyperlipidemia   . Hypertension   . IBS (irritable bowel syndrome)   . Interstitial cystitis   . Productive cough    intermittant  . Smokers' cough ([place])   . Urgency of urination     Past Surgical History:  Procedure Laterality Date  . ABDOMINAL HYSTERECTOMY  1975  . ANTERIOR CERVICAL DECOMP/DISCECTOMY FUSION N/A 09/07/2014   Procedure: Exploration of Fusion and Removal of Anterior Cervical Hematoma;  Surgeon: Newman Pies, MD;  Location: [facility] NEURO ORS;  Service: Neurosurgery;  Laterality: N/A;  . ANTERIOR FUSION CERVICAL SPINE  09/03/2014   C5 -- C7  . APPENDECTOMY    . CARPAL TUNNEL RELEASE Right 1993  . COLON SURGERY    . COLONOSCOPY  last one 04-30-2013  . CYSTO WITH HYDRODISTENSION N/A 06/08/2015   Procedure: CYSTOSCOPY/HYDRODISTENSION INSTILLATION OF MARCAINE AND PYRIDIUM;  Surgeon: Irine Seal, MD;  Location: [facility];  Service: Urology;  Laterality: N/A;    . CYSTO/  HOD/  BLADDER BX  1990's  . ESOPHAGOGASTRODUODENOSCOPY  last one 09-11-2012  . LAPAROSCOPIC APPENDECTOMY N/A 09/21/2013   Procedure: APPENDECTOMY LAPAROSCOPIC;  Surgeon: Rolm Bookbinder, MD;  Location: [place];  Service: General;  Laterality: N/A;  . LAPAROSCOPIC CHOLECYSTECTOMY  2002  . LAPAROSCOPY SIGMOID COLECTOMY  10-05-2008   diverticulitis  . MICROLARYNGOSCOPY  09-27-2006   w/ true vocal cord stripping  and bilateral bx's of lesion's (benign)  . SINUS SURGERY WITH [place]  . [place]  . TONSILLECTOMY AND ADENOIDECTOMY  1964    Family History  Problem Relation Age of Onset  . Pancreatic cancer Mother   . Colon polyps Mother   . Pancreatic cancer Father   . Diabetes Father   . Heart disease Father   . Leukemia Father   . Prostate cancer Father   . Hypertension Father   . Colon cancer Neg Hx     Social History   Social History  . Marital status: Married    Spouse name: N/A  . Number of children: N/A  . Years of education: N/A   Occupational History  . Not on file.   Social History Main Topics  . Smoking status: Current Every Day Smoker    Packs/day: 1.00  Years: 47.00    Types: Cigarettes  . Smokeless tobacco: Never Used     Comment: form given 02/17/16  . Alcohol use No  . Drug use: No  . Sexual activity: Not Currently    Birth control/ protection: Post-menopausal   Other Topics Concern  . Not on file   Social History Narrative  . No narrative on file    Outpatient Medications Prior to Visit  Medication Sig Dispense Refill  . albuterol (PROVENTIL) (2.5 MG/3ML) 0.083% nebulizer solution Inhale 2.5 mg into the lungs every 4 (four) hours as needed for wheezing or shortness of breath. q 4-6 hours as needed  0  . aspirin EC 81 MG tablet Take 1 tablet (81 mg total) by mouth daily. Over the counter    . atorvastatin (LIPITOR) 40 MG tablet TAKE 1 TABLET (40 MG TOTAL) BY MOUTH DAILY AT 6 PM. 30 tablet 2  .  dicyclomine (BENTYL) 10 MG capsule Take 1 capsule by mouth three times daily as needed for cramping and spasms. 60 capsule 3  . doxycycline (VIBRA-TABS) 100 MG tablet Take 1 tablet (100 mg total) by mouth 2 (two) times daily. Can give caps or generic 20 tablet 0  . esomeprazole (NEXIUM) 40 MG capsule Take 1 capsule (40 mg total) by mouth 2 (two) times daily before a meal. 180 capsule 3  . estradiol (CLIMARA - DOSED IN MG/24 HR) 0.1 mg/24hr patch Place 0.1 mg onto the skin every Monday.  0  . ezetimibe (ZETIA) 10 MG tablet Take 1 tablet (10 mg total) by mouth daily. 90 tablet 1  . fluticasone (FLONASE) 50 MCG/ACT nasal spray Place 2 sprays into both nostrils daily. 16 g 6  . Fluticasone-Salmeterol (ADVAIR) 100-50 MCG/DOSE AEPB Inhale 1 puff into the lungs 2 (two) times daily. 1 each 3  . ibuprofen (ADVIL,MOTRIN) 200 MG tablet Take 400 mg by mouth every 6 (six) hours as needed (pain).    Marland Kitchen lisinopril (PRINIVIL,ZESTRIL) 20 MG tablet TAKE 1 TABLET (20 MG TOTAL) BY MOUTH DAILY. 30 tablet 2  . lisinopril (PRINIVIL,ZESTRIL) 20 MG tablet Take 1 tablet (20 mg total) by mouth daily. 30 tablet 3  . LORazepam (ATIVAN) 0.5 MG tablet Take 0.5 mg by mouth at bedtime.   5  . polyethylene glycol (MIRALAX / GLYCOLAX) packet Take 17 g by mouth daily as needed for moderate constipation. 14 each 0  . senna-docusate (SENOKOT-S) 8.6-50 MG tablet Take 1 tablet by mouth at bedtime. 30 tablet 0  . sertraline (ZOLOFT) 50 MG tablet Take 50 mg by mouth at bedtime.    . sodium chloride (OCEAN) 0.65 % SOLN nasal spray Place 1 spray into both nostrils daily as needed for congestion.    . mometasone-formoterol (DULERA) 200-5 MCG/ACT AERO 2 puffs bid (Patient taking differently: Inhale 2 puffs into the lungs 2 (two) times daily as needed for wheezing or shortness of breath. ) 1 Inhaler 2  . tiotropium (SPIRIVA HANDIHALER) 18 MCG inhalation capsule Place 1 capsule (18 mcg total) into inhaler and inhale daily. (Patient taking  differently: Place 18 mcg into inhaler and inhale daily as needed (bronchitis). ) 30 capsule 12  . cyclobenzaprine (FLEXERIL) 5 MG tablet Take 1 tablet (5 mg total) by mouth 3 (three) times daily. (Patient not taking: Reported on 11/07/2016) 90 tablet 1  . azelastine (ASTELIN) 0.1 % nasal spray Place 2 sprays into both nostrils 2 (two) times daily. Use in each nostril as directed (Patient not taking: Reported on 11/07/2016) 30 mL 3  No facility-administered medications prior to visit.     Allergies  Allergen Reactions  . Amoxicillin-Pot Clavulanate Shortness Of Breath, Itching and Swelling  . Avelox [Moxifloxacin Hcl In Nacl] Swelling    Swelling, itching and shortness of breath  . Ciprofloxacin Shortness Of Breath, Itching and Swelling  . Keflex [Cephalexin] Shortness Of Breath and Swelling  . Moxifloxacin Shortness Of Breath, Itching and Swelling  . Penicillins Shortness Of Breath, Itching and Swelling  . Sulfa Antibiotics Shortness Of Breath, Itching and Swelling  . Prednisone Other (See Comments)    GI irritation; is able to tolerate injections  . Acyclovir And Related Other (See Comments)    Pt does not recall this reaction    Review of Systems  Constitutional: Negative for chills, fever and malaise/fatigue.  HENT: Negative for congestion and hearing loss.   Eyes: Negative for discharge.  Respiratory: Negative for cough, sputum production and shortness of breath.   Cardiovascular: Negative for chest pain, palpitations and leg swelling.  Gastrointestinal: Negative for abdominal pain, blood in stool, constipation, diarrhea, heartburn, nausea and vomiting.  Genitourinary: Negative for dysuria, frequency, hematuria and urgency.  Musculoskeletal: Positive for neck pain. Negative for back pain, falls and myalgias.  Skin: Negative for rash.  Neurological: Positive for headaches. Negative for dizziness, sensory change, loss of consciousness and weakness.  Endo/Heme/Allergies: Negative  for environmental allergies. Does not bruise/bleed easily.  Psychiatric/Behavioral: Negative for depression and suicidal ideas. The patient is not nervous/anxious and does not have insomnia.        Objective:    Physical Exam  Constitutional: She is oriented to person, place, and time. She appears well-developed and well-nourished.  HENT:  Head: Normocephalic and atraumatic.  Eyes: Conjunctivae and EOM are normal.  Neck: Normal range of motion. Neck supple. No JVD present. Carotid bruit is not present. No thyromegaly present.  Cardiovascular: Normal rate, regular rhythm and normal heart sounds.   No murmur heard. Pulmonary/Chest: Effort normal and breath sounds normal. No respiratory distress. She has no wheezes. She has no rales. She exhibits no tenderness.  Musculoskeletal: She exhibits no edema.  Neurological: She is alert and oriented to person, place, and time.  Psychiatric: She has a normal mood and affect. Her behavior is normal. Judgment and thought content normal.  Nursing note and vitals reviewed.   BP 110/62 (BP Location: Left Arm, Patient Position: Sitting, Cuff Size: Normal)   Pulse 84   Temp 98.2 F (36.8 C) (Oral)   Resp 16   Ht 5' 1.5" (1.562 m)   Wt 112 lb 3.2 oz (50.9 kg)   SpO2 95%   BMI 20.86 kg/m  Wt Readings from Last 3 Encounters:  11/07/16 112 lb 3.2 oz (50.9 kg)  07/12/16 112 lb 9.6 oz (51.1 kg)  07/10/16 114 lb 3.2 oz (51.8 kg)   BP Readings from Last 3 Encounters:  11/07/16 110/62  07/12/16 102/70  07/10/16 (!) 146/78     Immunization History  Administered Date(s) Administered  . Influenza Split 04/30/2013  . Influenza Whole 12/11/2007, 04/30/2012  . Influenza, High Dose Seasonal PF 07/10/2016  . Influenza,inj,Quad PF,36+ Mos 06/08/2014, 04/27/2015  . Pneumococcal Conjugate-13 06/20/2013  . Pneumococcal Polysaccharide-23 12/11/2007  . Td 02/22/2009    Health Maintenance  Topic Date Due  . PNA vac Low Risk Adult (2 of 2 - PPSV23)  06/20/2014  . INFLUENZA VACCINE  02/28/2017  . COLONOSCOPY  04/30/2018  . MAMMOGRAM  08/01/2018  . TETANUS/TDAP  02/23/2019  . DEXA SCAN  Completed  .  Hepatitis C Screening  Completed    Lab Results  Component Value Date   WBC 5.4 07/12/2016   HGB 13.8 07/12/2016   HCT 41.4 07/12/2016   PLT 215.0 07/12/2016   GLUCOSE 113 (H) 11/07/2016   CHOL 227 (H) 11/07/2016   TRIG 255.0 (H) 11/07/2016   HDL 35.80 (L) 11/07/2016   LDLDIRECT 151.0 11/07/2016   LDLCALC 144 (H) 03/30/2016   ALT 10 11/07/2016   AST 14 11/07/2016   NA 138 11/07/2016   K 5.1 11/07/2016   CL 105 11/07/2016   CREATININE 0.93 11/07/2016   BUN 14 11/07/2016   CO2 29 11/07/2016   TSH 1.92 03/30/2016   INR 1.0 05/21/2009   HGBA1C 6.1 09/19/2011   MICROALBUR <0.7 11/09/2014    Lab Results  Component Value Date   TSH 1.92 03/30/2016   Lab Results  Component Value Date   WBC 5.4 07/12/2016   HGB 13.8 07/12/2016   HCT 41.4 07/12/2016   MCV 88.7 07/12/2016   PLT 215.0 07/12/2016   Lab Results  Component Value Date   NA 138 11/07/2016   K 5.1 11/07/2016   CO2 29 11/07/2016   GLUCOSE 113 (H) 11/07/2016   BUN 14 11/07/2016   CREATININE 0.93 11/07/2016   BILITOT 0.2 11/07/2016   ALKPHOS 57 11/07/2016   AST 14 11/07/2016   ALT 10 11/07/2016   PROT 6.6 11/07/2016   ALBUMIN 4.0 11/07/2016   CALCIUM 9.5 11/07/2016   ANIONGAP 6 04/12/2016   GFR 63.64 11/07/2016   Lab Results  Component Value Date   CHOL 227 (H) 11/07/2016   Lab Results  Component Value Date   HDL 35.80 (L) 11/07/2016   Lab Results  Component Value Date   LDLCALC 144 (H) 03/30/2016   Lab Results  Component Value Date   TRIG 255.0 (H) 11/07/2016   Lab Results  Component Value Date   CHOLHDL 6 11/07/2016   Lab Results  Component Value Date   HGBA1C 6.1 09/19/2011         Assessment & Plan:   Problem List Items Addressed This Visit      Unprioritized   HBP (high blood pressure) - Primary   Relevant Orders    Comprehensive metabolic panel (Completed)   COPD exacerbation (HCC)   Relevant Medications   azelastine (ASTELIN) 0.1 % nasal spray   tiotropium (SPIRIVA HANDIHALER) 18 MCG inhalation capsule   mometasone-formoterol (DULERA) 200-5 MCG/ACT AERO    Other Visit Diagnoses    Spasm       Relevant Medications   tiZANidine (ZANAFLEX) 4 MG capsule   Mixed hyperlipidemia       Relevant Orders   Lipid panel (Completed)   LDL cholesterol, direct (Completed)   Multiple allergies       Relevant Medications   azelastine (ASTELIN) 0.1 % nasal spray      I am having Ms. Defeo start on tiZANidine. I am also having her maintain her sertraline, esomeprazole, LORazepam, fluticasone, albuterol, estradiol, sodium chloride, ibuprofen, cyclobenzaprine, dicyclomine, polyethylene glycol, senna-docusate, aspirin EC, ezetimibe, lisinopril, lisinopril, doxycycline, Fluticasone-Salmeterol, atorvastatin, azelastine, tiotropium, and mometasone-formoterol.  Meds ordered this encounter  Medications  . azelastine (ASTELIN) 0.1 % nasal spray    Sig: Place 2 sprays into both nostrils 2 (two) times daily. Use in each nostril as directed    Dispense:  30 mL    Refill:  3  . tiotropium (SPIRIVA HANDIHALER) 18 MCG inhalation capsule    Sig: Place 1 capsule (18 mcg total)  into inhaler and inhale daily.    Dispense:  30 capsule    Refill:  12  . mometasone-formoterol (DULERA) 200-5 MCG/ACT AERO    Sig: 2 puffs bid    Dispense:  1 Inhaler    Refill:  2  . tiZANidine (ZANAFLEX) 4 MG capsule    Sig: Take 1 capsule (4 mg total) by mouth 3 (three) times daily as needed for muscle spasms.    Dispense:  30 capsule    Refill:  0    CMA served as scribe during this visit. History, Physical and Plan performed by medical provider. Documentation and orders reviewed and attested to.   [name], DO   Patient ID: DELICIA BERENS, female   DOB: 09/03/1945, 71 y.o.   MRN: [id number]

## 2016-11-08 LAB — LIPID PANEL
CHOL/HDL RATIO: 6
CHOLESTEROL: 227 mg/dL — AB (ref 0–200)
HDL: 35.8 mg/dL — AB (ref >39.00)
NonHDL: 190.76
Triglycerides: 255 mg/dL — ABNORMAL HIGH (ref 0.0–149.0)
VLDL: 51 mg/dL — AB (ref 0.0–40.0)

## 2016-11-08 LAB — COMPREHENSIVE METABOLIC PANEL
ALBUMIN: 4 g/dL (ref 3.5–5.2)
ALT: 10 U/L (ref 0–35)
AST: 14 U/L (ref 0–37)
Alkaline Phosphatase: 57 U/L (ref 39–117)
BUN: 14 mg/dL (ref 6–23)
CHLORIDE: 105 meq/L (ref 96–112)
CO2: 29 mEq/L (ref 19–32)
Calcium: 9.5 mg/dL (ref 8.4–10.5)
Creatinine, Ser: 0.93 mg/dL (ref 0.40–1.20)
GFR: 63.64 mL/min (ref >60.00)
Glucose, Bld: 113 mg/dL — ABNORMAL HIGH (ref 70–99)
POTASSIUM: 5.1 meq/L (ref 3.5–5.1)
SODIUM: 138 meq/L (ref 135–145)
Total Bilirubin: 0.2 mg/dL (ref 0.2–1.2)
Total Protein: 6.6 g/dL (ref 6.0–8.3)

## 2016-11-08 LAB — LDL CHOLESTEROL, DIRECT: LDL DIRECT: 151 mg/dL

## 2016-11-09 ENCOUNTER — Other Ambulatory Visit: Payer: Self-pay | Admitting: Family Medicine

## 2016-11-09 DIAGNOSIS — E785 Hyperlipidemia, unspecified: Secondary | ICD-10-CM

## 2016-11-09 MED ORDER — FENOFIBRATE 160 MG PO TABS: 160.0000 mg | ORAL_TABLET | Freq: Every day | ORAL | 3 refills | Status: DC

## 2016-11-21 ENCOUNTER — Ambulatory Visit: Payer: BLUE CROSS/BLUE SHIELD | Admitting: Gastroenterology

## 2016-11-30 ENCOUNTER — Other Ambulatory Visit: Payer: Self-pay | Admitting: Family Medicine

## 2016-11-30 DIAGNOSIS — J441 Chronic obstructive pulmonary disease with (acute) exacerbation: Secondary | ICD-10-CM

## 2016-11-30 MED ORDER — TIOTROPIUM BROMIDE MONOHYDRATE 18 MCG IN CAPS: 18.0000 ug | ORAL_CAPSULE | Freq: Every day | RESPIRATORY_TRACT | 1 refills | Status: DC

## 2017-02-08 ENCOUNTER — Other Ambulatory Visit: Payer: BLUE CROSS/BLUE SHIELD

## 2017-02-20 ENCOUNTER — Ambulatory Visit (HOSPITAL_BASED_OUTPATIENT_CLINIC_OR_DEPARTMENT_OTHER)
Admission: RE | Admit: 2017-02-20 | Discharge: 2017-02-20 | Disposition: A | Discharge status: Home / Self Care | Payer: BLUE CROSS/BLUE SHIELD | Source: Ambulatory Visit | Attending: Family Medicine | Admitting: Family Medicine

## 2017-02-20 ENCOUNTER — Encounter: Payer: Self-pay | Admitting: Family Medicine

## 2017-02-20 ENCOUNTER — Ambulatory Visit (INDEPENDENT_AMBULATORY_CARE_PROVIDER_SITE_OTHER): Payer: BLUE CROSS/BLUE SHIELD | Admitting: Family Medicine

## 2017-02-20 VITALS — BP 120/70 | HR 64 | Temp 98.5°F | Resp 16 | Ht 61.5 in | Wt 107.4 lb

## 2017-02-20 DIAGNOSIS — J0140 Acute pansinusitis, unspecified: Secondary | ICD-10-CM | POA: Diagnosis not present

## 2017-02-20 DIAGNOSIS — J209 Acute bronchitis, unspecified: Secondary | ICD-10-CM | POA: Diagnosis present

## 2017-02-20 DIAGNOSIS — J440 Chronic obstructive pulmonary disease with acute lower respiratory infection: Secondary | ICD-10-CM | POA: Diagnosis not present

## 2017-02-20 DIAGNOSIS — J441 Chronic obstructive pulmonary disease with (acute) exacerbation: Secondary | ICD-10-CM | POA: Diagnosis not present

## 2017-02-20 DIAGNOSIS — E785 Hyperlipidemia, unspecified: Secondary | ICD-10-CM

## 2017-02-20 MED ORDER — CLARITHROMYCIN ER 500 MG PO TB24: 1000.0000 mg | ORAL_TABLET | Freq: Every day | ORAL | 0 refills | Status: DC

## 2017-02-20 MED ORDER — ALBUTEROL SULFATE (2.5 MG/3ML) 0.083% IN NEBU
2.5000 mg | INHALATION_SOLUTION | RESPIRATORY_TRACT | 0 refills | Status: DC | PRN
Start: 2017-02-20 — End: 2018-09-03

## 2017-02-20 NOTE — Progress Notes (Signed)
Patient ID: Leah [name], female   DOB: 1945/11/12, 71 y.o.   MRN: [id number]     Subjective:  I acted as a Education administrator for Dr. Carollee Herter.  Guerry [name], [place]   Patient ID: LAURELIN [name], female    DOB: 1946-06-14, 71 y.o.   MRN: [id number]  Chief Complaint  Patient presents with  . Sinusitis    Sinusitis  This is a new problem. Episode onset: over the weekend. Associated symptoms include congestion, coughing, sinus pressure and a sore throat. Pertinent negatives include no chills, ear pain or sneezing.   pt was seen in UC 2 weeks ago and was given levaquin.  She was getting better but symptoms came back over weekend.   Patient is in today for sinus issues.   Patient Care Team: Carollee Herter, Alferd Apa, DO as PCP - General   Past Medical History:  Diagnosis Date  . Anxiety   . Barrett esophagus   . Bladder pain   . Chronic bronchitis ([place])    per pt last episode july 2016  . Complication of anesthesia    slow to wake  . Depression   . Diverticulosis   . Frequency of urination   . GERD (gastroesophageal reflux disease)   . Hiatal hernia   . History of adenomatous polyp of colon    tubular adenoma  . History of chronic gastritis   . History of diverticulitis of colon   . History of esophageal dilatation    for stricture  . History of esophageal spasm   . Hyperlipidemia   . Hypertension   . IBS (irritable bowel syndrome)   . Interstitial cystitis   . Productive cough    intermittant  . Smokers' cough ([place])   . Urgency of urination     Past Surgical History:  Procedure Laterality Date  . ABDOMINAL HYSTERECTOMY  1975  . ANTERIOR CERVICAL DECOMP/DISCECTOMY FUSION N/A 09/07/2014   Procedure: Exploration of Fusion and Removal of Anterior Cervical Hematoma;  Surgeon: Newman Pies, MD;  Location: [facility] NEURO ORS;  Service: Neurosurgery;  Laterality: N/A;  . ANTERIOR FUSION CERVICAL SPINE  09/03/2014   C5 -- C7  . APPENDECTOMY    . CARPAL TUNNEL RELEASE Right 1993  .  COLON SURGERY    . COLONOSCOPY  last one 04-30-2013  . CYSTO WITH HYDRODISTENSION N/A 06/08/2015   Procedure: CYSTOSCOPY/HYDRODISTENSION INSTILLATION OF MARCAINE AND PYRIDIUM;  Surgeon: Irine Seal, MD;  Location: [facility];  Service: Urology;  Laterality: N/A;  . CYSTO/  HOD/  BLADDER BX  1990's  . ESOPHAGOGASTRODUODENOSCOPY  last one 09-11-2012  . LAPAROSCOPIC APPENDECTOMY N/A 09/21/2013   Procedure: APPENDECTOMY LAPAROSCOPIC;  Surgeon: Rolm Bookbinder, MD;  Location: [facility];  Service: General;  Laterality: N/A;  . LAPAROSCOPIC CHOLECYSTECTOMY  2002  . LAPAROSCOPY SIGMOID COLECTOMY  10-05-2008   diverticulitis  . MICROLARYNGOSCOPY  09-27-2006   w/ true vocal cord stripping  and bilateral bx's of lesion's (benign)  . SINUS SURGERY WITH [place]  . [place]  . TONSILLECTOMY AND ADENOIDECTOMY  1964    Family History  Problem Relation Age of Onset  . Pancreatic cancer Mother   . Colon polyps Mother   . Pancreatic cancer Father   . Diabetes Father   . Heart disease Father   . Leukemia Father   . Prostate cancer Father   . Hypertension Father   . Colon cancer Neg Hx     Social History   Social History  .  Marital status: Married    Spouse name: N/A  . Number of children: N/A  . Years of education: N/A   Occupational History  . Not on file.   Social History Main Topics  . Smoking status: Current Every Day Smoker    Packs/day: 1.00    Years: 47.00    Types: Cigarettes  . Smokeless tobacco: Never Used     Comment: form given 02/17/16  . Alcohol use No  . Drug use: No  . Sexual activity: Not Currently    Birth control/ protection: Post-menopausal   Other Topics Concern  . Not on file   Social History Narrative  . No narrative on file    Outpatient Medications Prior to Visit  Medication Sig Dispense Refill  . albuterol (PROVENTIL) (2.5 MG/3ML) 0.083% nebulizer solution Inhale 2.5 mg into the lungs every 4 (four)  hours as needed for wheezing or shortness of breath. q 4-6 hours as needed  0  . aspirin EC 81 MG tablet Take 1 tablet (81 mg total) by mouth daily. Over the counter    . atorvastatin (LIPITOR) 40 MG tablet TAKE 1 TABLET (40 MG TOTAL) BY MOUTH DAILY AT 6 PM. 30 tablet 2  . azelastine (ASTELIN) 0.1 % nasal spray Place 2 sprays into both nostrils 2 (two) times daily. Use in each nostril as directed 30 mL 3  . cyclobenzaprine (FLEXERIL) 5 MG tablet Take 1 tablet (5 mg total) by mouth 3 (three) times daily. 90 tablet 1  . esomeprazole (NEXIUM) 40 MG capsule Take 1 capsule (40 mg total) by mouth 2 (two) times daily before a meal. 180 capsule 3  . estradiol (CLIMARA - DOSED IN MG/24 HR) 0.1 mg/24hr patch Place 0.1 mg onto the skin every Monday.  0  . ezetimibe (ZETIA) 10 MG tablet Take 1 tablet (10 mg total) by mouth daily. 90 tablet 1  . fenofibrate 160 MG tablet Take 1 tablet (160 mg total) by mouth daily. 30 tablet 3  . fluticasone (FLONASE) 50 MCG/ACT nasal spray Place 2 sprays into both nostrils daily. 16 g 6  . Fluticasone-Salmeterol (ADVAIR) 100-50 MCG/DOSE AEPB Inhale 1 puff into the lungs 2 (two) times daily. 1 each 3  . ibuprofen (ADVIL,MOTRIN) 200 MG tablet Take 400 mg by mouth every 6 (six) hours as needed (pain).    Marland Kitchen lisinopril (PRINIVIL,ZESTRIL) 20 MG tablet TAKE 1 TABLET (20 MG TOTAL) BY MOUTH DAILY. 30 tablet 2  . lisinopril (PRINIVIL,ZESTRIL) 20 MG tablet Take 1 tablet (20 mg total) by mouth daily. 30 tablet 3  . LORazepam (ATIVAN) 0.5 MG tablet Take 0.5 mg by mouth at bedtime.   5  . mometasone-formoterol (DULERA) 200-5 MCG/ACT AERO 2 puffs bid 1 Inhaler 2  . polyethylene glycol (MIRALAX / GLYCOLAX) packet Take 17 g by mouth daily as needed for moderate constipation. 14 each 0  . senna-docusate (SENOKOT-S) 8.6-50 MG tablet Take 1 tablet by mouth at bedtime. 30 tablet 0  . sertraline (ZOLOFT) 50 MG tablet Take 50 mg by mouth at bedtime.    . sodium chloride (OCEAN) 0.65 % SOLN nasal  spray Place 1 spray into both nostrils daily as needed for congestion.    Marland Kitchen tiotropium (SPIRIVA HANDIHALER) 18 MCG inhalation capsule Place 1 capsule (18 mcg total) into inhaler and inhale daily. 90 capsule 1  . tiZANidine (ZANAFLEX) 4 MG capsule Take 1 capsule (4 mg total) by mouth 3 (three) times daily as needed for muscle spasms. 30 capsule 0  . dicyclomine (  BENTYL) 10 MG capsule Take 1 capsule by mouth three times daily as needed for cramping and spasms. 60 capsule 3  . doxycycline (VIBRA-TABS) 100 MG tablet Take 1 tablet (100 mg total) by mouth 2 (two) times daily. Can give caps or generic 20 tablet 0   No facility-administered medications prior to visit.     Allergies  Allergen Reactions  . Amoxicillin-Pot Clavulanate Shortness Of Breath, Itching and Swelling  . Avelox [Moxifloxacin Hcl In Nacl] Swelling    Swelling, itching and shortness of breath  . Ciprofloxacin Shortness Of Breath, Itching and Swelling  . Keflex [Cephalexin] Shortness Of Breath and Swelling  . Moxifloxacin Shortness Of Breath, Itching and Swelling  . Penicillins Shortness Of Breath, Itching and Swelling  . Sulfa Antibiotics Shortness Of Breath, Itching and Swelling  . Prednisone Other (See Comments)    GI irritation; is able to tolerate injections  . Acyclovir And Related Other (See Comments)    Pt does not recall this reaction    Review of Systems  Constitutional: Negative for chills.  HENT: Positive for congestion, sinus pressure and sore throat. Negative for ear pain and sneezing.   Respiratory: Positive for cough and wheezing.   Cardiovascular:       Chest feels tight.       Objective:    Physical Exam  Constitutional: She is oriented to person, place, and time. She appears well-developed and well-nourished.  HENT:  Right Ear: External ear normal.  Left Ear: External ear normal.  + PND + errythema  Eyes: Conjunctivae are normal. Right eye exhibits no discharge. Left eye exhibits no discharge.    Cardiovascular: Normal rate, regular rhythm and normal heart sounds.   No murmur heard. Pulmonary/Chest: Effort normal. No respiratory distress. She has wheezes. She has no rales. She exhibits no tenderness.  Neb with albuterol given --- lungs clear after   Musculoskeletal: She exhibits no edema.  Lymphadenopathy:    She has cervical adenopathy.  Neurological: She is alert and oriented to person, place, and time.  Nursing note and vitals reviewed.   BP 120/70 (BP Location: Right Arm, Cuff Size: Normal)   Pulse 64   Temp 98.5 F (36.9 C) (Oral)   Resp 16   Ht 5' 1.5" (1.562 m)   Wt 107 lb 6.4 oz (48.7 kg)   SpO2 93%   BMI 19.96 kg/m  Wt Readings from Last 3 Encounters:  02/20/17 107 lb 6.4 oz (48.7 kg)  11/07/16 112 lb 3.2 oz (50.9 kg)  07/12/16 112 lb 9.6 oz (51.1 kg)   BP Readings from Last 3 Encounters:  02/20/17 120/70  11/07/16 110/62  07/12/16 102/70     Immunization History  Administered Date(s) Administered  . Influenza Split 04/30/2013  . Influenza Whole 12/11/2007, 04/30/2012  . Influenza, High Dose Seasonal PF 07/10/2016  . Influenza,inj,Quad PF,36+ Mos 06/08/2014, 04/27/2015  . Pneumococcal Conjugate-13 06/20/2013  . Pneumococcal Polysaccharide-23 12/11/2007  . Td 02/22/2009    Health Maintenance  Topic Date Due  . PNA vac Low Risk Adult (2 of 2 - PPSV23) 06/20/2014  . INFLUENZA VACCINE  02/28/2017  . COLONOSCOPY  04/30/2018  . MAMMOGRAM  08/01/2018  . TETANUS/TDAP  02/23/2019  . DEXA SCAN  Completed  . Hepatitis C Screening  Completed    Lab Results  Component Value Date   WBC 5.4 07/12/2016   HGB 13.8 07/12/2016   HCT 41.4 07/12/2016   PLT 215.0 07/12/2016   GLUCOSE 113 (H) 11/07/2016   CHOL 227 (  H) 11/07/2016   TRIG 255.0 (H) 11/07/2016   HDL 35.80 (L) 11/07/2016   LDLDIRECT 151.0 11/07/2016   LDLCALC 144 (H) 03/30/2016   ALT 10 11/07/2016   AST 14 11/07/2016   NA 138 11/07/2016   K 5.1 11/07/2016   CL 105 11/07/2016   CREATININE  0.93 11/07/2016   BUN 14 11/07/2016   CO2 29 11/07/2016   TSH 1.92 03/30/2016   INR 1.0 05/21/2009   HGBA1C 6.1 09/19/2011   MICROALBUR <0.7 11/09/2014    Lab Results  Component Value Date   TSH 1.92 03/30/2016   Lab Results  Component Value Date   WBC 5.4 07/12/2016   HGB 13.8 07/12/2016   HCT 41.4 07/12/2016   MCV 88.7 07/12/2016   PLT 215.0 07/12/2016   Lab Results  Component Value Date   NA 138 11/07/2016   K 5.1 11/07/2016   CO2 29 11/07/2016   GLUCOSE 113 (H) 11/07/2016   BUN 14 11/07/2016   CREATININE 0.93 11/07/2016   BILITOT 0.2 11/07/2016   ALKPHOS 57 11/07/2016   AST 14 11/07/2016   ALT 10 11/07/2016   PROT 6.6 11/07/2016   ALBUMIN 4.0 11/07/2016   CALCIUM 9.5 11/07/2016   ANIONGAP 6 04/12/2016   GFR 63.64 11/07/2016   Lab Results  Component Value Date   CHOL 227 (H) 11/07/2016   Lab Results  Component Value Date   HDL 35.80 (L) 11/07/2016   Lab Results  Component Value Date   LDLCALC 144 (H) 03/30/2016   Lab Results  Component Value Date   TRIG 255.0 (H) 11/07/2016   Lab Results  Component Value Date   CHOLHDL 6 11/07/2016   Lab Results  Component Value Date   HGBA1C 6.1 09/19/2011         Assessment & Plan:   Problem List Items Addressed This Visit      Unprioritized   Sinusitis, acute   Relevant Medications   clarithromycin (BIAXIN XL) 500 MG 24 hr tablet    Other Visit Diagnoses    Hyperlipidemia LDL goal <100    -  Primary   Relevant Orders   Lipid panel   Comprehensive metabolic panel   Acute bronchitis with chronic obstructive pulmonary disease (COPD) (HCC)       Relevant Medications   clarithromycin (BIAXIN XL) 500 MG 24 hr tablet      I have discontinued Ms. Wann's dicyclomine and doxycycline. I am also having her start on clarithromycin. Additionally, I am having her maintain her sertraline, esomeprazole, LORazepam, fluticasone, albuterol, estradiol, sodium chloride, ibuprofen, cyclobenzaprine,  polyethylene glycol, senna-docusate, aspirin EC, ezetimibe, lisinopril, lisinopril, Fluticasone-Salmeterol, atorvastatin, azelastine, mometasone-formoterol, tiZANidine, fenofibrate, and tiotropium.  Meds ordered this encounter  Medications  . clarithromycin (BIAXIN XL) 500 MG 24 hr tablet    Sig: Take 2 tablets (1,000 mg total) by mouth daily.    Dispense:  28 tablet    Refill:  0    CMA served as scribe during this visit. History, Physical and Plan performed by medical provider. Documentation and orders reviewed and attested to.  [name], DO

## 2017-02-20 NOTE — Patient Instructions (Signed)

## 2017-02-21 ENCOUNTER — Telehealth: Payer: Self-pay | Admitting: Family Medicine

## 2017-02-21 LAB — LIPID PANEL
CHOLESTEROL: 249 mg/dL — AB (ref 0–200)
HDL: 45.6 mg/dL (ref >39.00)
LDL Cholesterol: 176 mg/dL — ABNORMAL HIGH (ref 0–99)
NONHDL: 203.05
Total CHOL/HDL Ratio: 5
Triglycerides: 136 mg/dL (ref 0.0–149.0)
VLDL: 27.2 mg/dL (ref 0.0–40.0)

## 2017-02-21 LAB — COMPREHENSIVE METABOLIC PANEL
ALBUMIN: 4.2 g/dL (ref 3.5–5.2)
ALK PHOS: 67 U/L (ref 39–117)
ALT: 9 U/L (ref 0–35)
AST: 14 U/L (ref 0–37)
BUN: 11 mg/dL (ref 6–23)
CO2: 30 mEq/L (ref 19–32)
Calcium: 9.6 mg/dL (ref 8.4–10.5)
Chloride: 102 mEq/L (ref 96–112)
Creatinine, Ser: 0.85 mg/dL (ref 0.40–1.20)
GFR: 70.54 mL/min (ref >60.00)
GLUCOSE: 94 mg/dL (ref 70–99)
POTASSIUM: 4.9 meq/L (ref 3.5–5.1)
Sodium: 138 mEq/L (ref 135–145)
TOTAL PROTEIN: 7.2 g/dL (ref 6.0–8.3)
Total Bilirubin: 0.4 mg/dL (ref 0.2–1.2)

## 2017-02-21 NOTE — Telephone Encounter (Signed)
[name] name: Kacelyn Rowzee Relationship to patient: self Can be reached: [phone]  Reason for call: Pt requesting note writing her out of work today - Friday until she is feeling better (02/21/17-02/23/17). Pt would like to come in to pick up note. Please sign and call when ready.

## 2017-02-22 ENCOUNTER — Other Ambulatory Visit: Payer: Self-pay | Admitting: Family Medicine

## 2017-02-22 ENCOUNTER — Encounter: Payer: Self-pay | Admitting: Family Medicine

## 2017-02-22 DIAGNOSIS — E785 Hyperlipidemia, unspecified: Secondary | ICD-10-CM

## 2017-02-22 MED ORDER — ATORVASTATIN CALCIUM 80 MG PO TABS: 80.0000 mg | ORAL_TABLET | Freq: Every day | ORAL | 1 refills | Status: DC

## 2017-02-22 NOTE — Telephone Encounter (Signed)
[place] for 7/25-7/27

## 2017-02-22 NOTE — Telephone Encounter (Signed)
Patient is needing this work note---advise please on dates requested

## 2017-02-22 NOTE — Telephone Encounter (Signed)
Letter completed/patient notified and will pickup tomorrow at the front desk 02/23/17

## 2017-02-24 NOTE — Assessment & Plan Note (Signed)
abx per orders Steroid nasal spray  otc antihistamines

## 2017-02-24 NOTE — Assessment & Plan Note (Signed)
con't inhalers  Neb given in office

## 2017-02-26 ENCOUNTER — Ambulatory Visit (INDEPENDENT_AMBULATORY_CARE_PROVIDER_SITE_OTHER): Payer: BLUE CROSS/BLUE SHIELD | Admitting: Family Medicine

## 2017-02-26 ENCOUNTER — Encounter: Payer: Self-pay | Admitting: Family Medicine

## 2017-02-26 DIAGNOSIS — K219 Gastro-esophageal reflux disease without esophagitis: Secondary | ICD-10-CM

## 2017-02-26 DIAGNOSIS — J441 Chronic obstructive pulmonary disease with (acute) exacerbation: Secondary | ICD-10-CM

## 2017-02-26 MED ORDER — ESOMEPRAZOLE MAGNESIUM 40 MG PO CPDR: 40.0000 mg | DELAYED_RELEASE_CAPSULE | Freq: Two times a day (BID) | ORAL | 3 refills | Status: DC

## 2017-02-26 MED ORDER — LEVOFLOXACIN 500 MG PO TABS: 500.0000 mg | ORAL_TABLET | Freq: Every day | ORAL | 0 refills | Status: DC

## 2017-02-26 MED ORDER — MOMETASONE FURO-FORMOTEROL FUM 200-5 MCG/ACT IN AERO: INHALATION_SPRAY | RESPIRATORY_TRACT | 2 refills | Status: DC

## 2017-02-26 MED ORDER — TIOTROPIUM BROMIDE MONOHYDRATE 18 MCG IN CAPS: 18.0000 ug | ORAL_CAPSULE | Freq: Every day | RESPIRATORY_TRACT | 1 refills | Status: DC

## 2017-02-26 NOTE — Patient Instructions (Signed)

## 2017-02-26 NOTE — Progress Notes (Signed)
Patient ID: JOJO GEVING, female    DOB: 1945-10-02  Age: 71 y.o. MRN: [id number]    Subjective:  Subjective  HPI MESHAWN OCONNOR presents for sinus congestion , cough and wheezes.  Not much better from previous visit.  No fever.    Review of Systems  Constitutional: Positive for chills. Negative for appetite change, diaphoresis, fatigue, fever and unexpected weight change.  HENT: Positive for congestion, postnasal drip, rhinorrhea and sinus pressure.   Eyes: Negative for pain, redness and visual disturbance.  Respiratory: Positive for cough, shortness of breath and wheezing. Negative for chest tightness.   Cardiovascular: Negative for chest pain, palpitations and leg swelling.  Endocrine: Negative for cold intolerance, heat intolerance, polydipsia, polyphagia and polyuria.  Genitourinary: Negative for difficulty urinating, dysuria and frequency.  Allergic/Immunologic: Negative for environmental allergies.  Neurological: Negative for dizziness, light-headedness, numbness and headaches.    History Past Medical History:  Diagnosis Date  . Anxiety   . Barrett esophagus   . Bladder pain   . Chronic bronchitis ([place])    per pt last episode july 2016  . Complication of anesthesia    slow to wake  . Depression   . Diverticulosis   . Frequency of urination   . GERD (gastroesophageal reflux disease)   . Hiatal hernia   . History of adenomatous polyp of colon    tubular adenoma  . History of chronic gastritis   . History of diverticulitis of colon   . History of esophageal dilatation    for stricture  . History of esophageal spasm   . Hyperlipidemia   . Hypertension   . IBS (irritable bowel syndrome)   . Interstitial cystitis   . Productive cough    intermittant  . Smokers' cough ([place])   . Urgency of urination     She has a past surgical history that includes Carpal tunnel release (Right, 1993); Sinus surgery with [place] (1991); laparoscopic appendectomy (N/A,  09/21/2013); Anterior fusion cervical spine (09/03/2014); Anterior cervical decomp/discectomy fusion (N/A, 09/07/2014); Tonsillectomy and adenoidectomy (1964); Laparoscopic cholecystectomy (2002); Abdominal hysterectomy (1975); Temporomandibular joint surgery (1990); LAPAROSCOPY SIGMOID COLECTOMY (10-05-2008); Microlaryngoscopy (09-27-2006); Colonoscopy (last one 04-30-2013); Esophagogastroduodenoscopy (last one 09-11-2012); CYSTO/  HOD/  BLADDER BX (1990's); cysto with hydrodistension (N/A, 06/08/2015); Colon surgery; and Appendectomy.   Her family history includes Colon polyps in her mother; Diabetes in her father; Heart disease in her father; Hypertension in her father; Leukemia in her father; Pancreatic cancer in her father and mother; Prostate cancer in her father.She reports that she has been smoking Cigarettes.  She has a 47.00 pack-year smoking history. She has never used smokeless tobacco. She reports that she does not drink alcohol or use drugs.  Current Outpatient Prescriptions on File Prior to Visit  Medication Sig Dispense Refill  . albuterol (PROVENTIL) (2.5 MG/3ML) 0.083% nebulizer solution Inhale 3 mLs (2.5 mg total) into the lungs every 4 (four) hours as needed for wheezing or shortness of breath. q 4-6 hours as needed 75 mL 0  . aspirin EC 81 MG tablet Take 1 tablet (81 mg total) by mouth daily. Over the counter    . atorvastatin (LIPITOR) 80 MG tablet Take 1 tablet (80 mg total) by mouth daily. 90 tablet 1  . azelastine (ASTELIN) 0.1 % nasal spray Place 2 sprays into both nostrils 2 (two) times daily. Use in each nostril as directed 30 mL 3  . cyclobenzaprine (FLEXERIL) 5 MG tablet Take 1 tablet (5 mg total) by mouth 3 (three)  times daily. 90 tablet 1  . estradiol (CLIMARA - DOSED IN MG/24 HR) 0.1 mg/24hr patch Place 0.1 mg onto the skin every Monday.  0  . ezetimibe (ZETIA) 10 MG tablet Take 1 tablet (10 mg total) by mouth daily. 90 tablet 1  . fenofibrate 160 MG tablet Take 1 tablet  (160 mg total) by mouth daily. 30 tablet 3  . fluticasone (FLONASE) 50 MCG/ACT nasal spray Place 2 sprays into both nostrils daily. 16 g 6  . ibuprofen (ADVIL,MOTRIN) 200 MG tablet Take 400 mg by mouth every 6 (six) hours as needed (pain).    Marland Kitchen lisinopril (PRINIVIL,ZESTRIL) 20 MG tablet Take 1 tablet (20 mg total) by mouth daily. 30 tablet 3  . LORazepam (ATIVAN) 0.5 MG tablet Take 0.5 mg by mouth at bedtime.   5  . polyethylene glycol (MIRALAX / GLYCOLAX) packet Take 17 g by mouth daily as needed for moderate constipation. 14 each 0  . senna-docusate (SENOKOT-S) 8.6-50 MG tablet Take 1 tablet by mouth at bedtime. 30 tablet 0  . sertraline (ZOLOFT) 50 MG tablet Take 50 mg by mouth at bedtime.    . sodium chloride (OCEAN) 0.65 % SOLN nasal spray Place 1 spray into both nostrils daily as needed for congestion.    Marland Kitchen tiZANidine (ZANAFLEX) 4 MG capsule Take 1 capsule (4 mg total) by mouth 3 (three) times daily as needed for muscle spasms. 30 capsule 0   No current facility-administered medications on file prior to visit.      Objective:  Objective  Physical Exam  Constitutional: She is oriented to person, place, and time. She appears well-developed and well-nourished.  HENT:  Right Ear: External ear normal.  Left Ear: External ear normal.  Nose: Rhinorrhea present. No epistaxis. Right sinus exhibits maxillary sinus tenderness and frontal sinus tenderness. Left sinus exhibits maxillary sinus tenderness and frontal sinus tenderness.  Mouth/Throat: Posterior oropharyngeal erythema present. No oropharyngeal exudate or posterior oropharyngeal edema.  + PND + errythema  Eyes: Conjunctivae are normal. Right eye exhibits no discharge. Left eye exhibits no discharge.  Cardiovascular: Normal rate, regular rhythm and normal heart sounds.   No murmur heard. Pulmonary/Chest: Effort normal. No respiratory distress. She has decreased breath sounds. She has wheezes. She has no rales. She exhibits no  tenderness.  Musculoskeletal: She exhibits no edema.  Lymphadenopathy:    She has cervical adenopathy.  Neurological: She is alert and oriented to person, place, and time.  Nursing note and vitals reviewed.  BP 118/80 (BP Location: Left Arm, Patient Position: Sitting, Cuff Size: Normal)   Pulse 67   Temp 98.8 F (37.1 C)   Ht 5' 1.5" (1.562 m)   Wt 107 lb (48.5 kg)   SpO2 97%   BMI 19.89 kg/m  Wt Readings from Last 3 Encounters:  02/26/17 107 lb (48.5 kg)  02/20/17 107 lb 6.4 oz (48.7 kg)  11/07/16 112 lb 3.2 oz (50.9 kg)     Lab Results  Component Value Date   WBC 5.4 07/12/2016   HGB 13.8 07/12/2016   HCT 41.4 07/12/2016   PLT 215.0 07/12/2016   GLUCOSE 94 02/20/2017   CHOL 249 (H) 02/20/2017   TRIG 136.0 02/20/2017   HDL 45.60 02/20/2017   LDLDIRECT 151.0 11/07/2016   LDLCALC 176 (H) 02/20/2017   ALT 9 02/20/2017   AST 14 02/20/2017   NA 138 02/20/2017   K 4.9 02/20/2017   CL 102 02/20/2017   CREATININE 0.85 02/20/2017   BUN 11 02/20/2017  CO2 30 02/20/2017   TSH 1.92 03/30/2016   INR 1.0 05/21/2009   HGBA1C 6.1 09/19/2011   MICROALBUR <0.7 11/09/2014    Dg Chest 2 View  Result Date: 02/20/2017 CLINICAL DATA:  Two days of productive cough. Long-term smoker. History of COPD. EXAM: CHEST  2 VIEW COMPARISON:  PA and lateral chest x-ray of July 05, 2016 FINDINGS: The lungs are mildly hyperinflated. There is no focal infiltrate. There is no pleural effusion. The heart and pulmonary vascularity are normal. The mediastinum is normal in width. There is calcification in the wall of the thoracic aorta. The bony thorax is unremarkable. IMPRESSION: Chronic bronchitic changes, stable. No pneumonia, CHF, nor other acute cardiopulmonary abnormality. Electronically Signed   By: David  Martinique M.D.   On: 02/20/2017 16:51     Assessment & Plan:  Plan  I have discontinued Ms. Matranga's Fluticasone-Salmeterol and clarithromycin. I am also having her start on levofloxacin.  Additionally, I am having her maintain her sertraline, LORazepam, fluticasone, estradiol, sodium chloride, ibuprofen, cyclobenzaprine, polyethylene glycol, senna-docusate, aspirin EC, ezetimibe, lisinopril, azelastine, tiZANidine, fenofibrate, albuterol, atorvastatin, tiotropium, mometasone-formoterol, and esomeprazole.  Meds ordered this encounter  Medications  . tiotropium (SPIRIVA HANDIHALER) 18 MCG inhalation capsule    Sig: Place 1 capsule (18 mcg total) into inhaler and inhale daily.    Dispense:  90 capsule    Refill:  1  . mometasone-formoterol (DULERA) 200-5 MCG/ACT AERO    Sig: 2 puffs bid    Dispense:  1 Inhaler    Refill:  2  . levofloxacin (LEVAQUIN) 500 MG tablet    Sig: Take 1 tablet (500 mg total) by mouth daily.    Dispense:  10 tablet    Refill:  0  . esomeprazole (NEXIUM) 40 MG capsule    Sig: Take 1 capsule (40 mg total) by mouth 2 (two) times daily before a meal.    Dispense:  180 capsule    Refill:  3    Problem List Items Addressed This Visit      Unprioritized   GERD   Relevant Medications   esomeprazole (NEXIUM) 40 MG capsule   COPD exacerbation (HCC)   Relevant Medications   tiotropium (SPIRIVA HANDIHALER) 18 MCG inhalation capsule   mometasone-formoterol (DULERA) 200-5 MCG/ACT AERO   levofloxacin (LEVAQUIN) 500 MG tablet      Follow-up: Return in about 2 weeks (around 03/12/2017), or if symptoms worsen or fail to improve.  [name], DO

## 2017-02-26 NOTE — Progress Notes (Signed)
Pre visit review using our clinic review tool, if applicable. No additional management support is needed unless otherwise documented below in the visit note. 

## 2017-02-27 ENCOUNTER — Telehealth: Payer: Self-pay | Admitting: Family Medicine

## 2017-02-27 ENCOUNTER — Ambulatory Visit (INDEPENDENT_AMBULATORY_CARE_PROVIDER_SITE_OTHER): Payer: BLUE CROSS/BLUE SHIELD | Admitting: Medical

## 2017-02-27 ENCOUNTER — Emergency Department (HOSPITAL_BASED_OUTPATIENT_CLINIC_OR_DEPARTMENT_OTHER)
Admission: EM | Admit: 2017-02-27 | Discharge: 2017-02-27 | Disposition: A | Discharge status: Home / Self Care | Payer: BLUE CROSS/BLUE SHIELD | Attending: Emergency Medicine | Admitting: Emergency Medicine

## 2017-02-27 ENCOUNTER — Telehealth: Payer: Self-pay | Admitting: *Deleted

## 2017-02-27 ENCOUNTER — Encounter: Payer: Self-pay | Admitting: Medical

## 2017-02-27 ENCOUNTER — Encounter (HOSPITAL_BASED_OUTPATIENT_CLINIC_OR_DEPARTMENT_OTHER): Payer: Self-pay | Admitting: Emergency Medicine

## 2017-02-27 VITALS — BP 137/64 | HR 65 | Temp 98.7°F | Resp 16 | Ht 61.0 in | Wt 106.8 lb

## 2017-02-27 DIAGNOSIS — Y658 Other specified misadventures during surgical and medical care: Secondary | ICD-10-CM | POA: Insufficient documentation

## 2017-02-27 DIAGNOSIS — T7840XA Allergy, unspecified, initial encounter: Secondary | ICD-10-CM | POA: Diagnosis not present

## 2017-02-27 DIAGNOSIS — J440 Chronic obstructive pulmonary disease with acute lower respiratory infection: Secondary | ICD-10-CM

## 2017-02-27 DIAGNOSIS — J3489 Other specified disorders of nose and nasal sinuses: Secondary | ICD-10-CM

## 2017-02-27 DIAGNOSIS — T368X5A Adverse effect of other systemic antibiotics, initial encounter: Secondary | ICD-10-CM | POA: Insufficient documentation

## 2017-02-27 DIAGNOSIS — T8069XA Other serum reaction due to other serum, initial encounter: Secondary | ICD-10-CM | POA: Diagnosis not present

## 2017-02-27 DIAGNOSIS — L270 Generalized skin eruption due to drugs and medicaments taken internally: Secondary | ICD-10-CM | POA: Insufficient documentation

## 2017-02-27 DIAGNOSIS — R21 Rash and other nonspecific skin eruption: Secondary | ICD-10-CM | POA: Diagnosis present

## 2017-02-27 DIAGNOSIS — J209 Acute bronchitis, unspecified: Secondary | ICD-10-CM

## 2017-02-27 MED ORDER — DOXYCYCLINE HYCLATE 100 MG PO TABS: 100.0000 mg | ORAL_TABLET | Freq: Two times a day (BID) | ORAL | 0 refills | Status: DC

## 2017-02-27 MED ORDER — HYDROXYZINE HCL 10 MG PO TABS: ORAL_TABLET | ORAL | 0 refills | Status: DC

## 2017-02-27 MED ORDER — DEXAMETHASONE SODIUM PHOSPHATE 10 MG/ML IJ SOLN
10.0000 mg | Freq: Once | INTRAMUSCULAR | Status: AC
  Administered 2017-02-27: 10 mg via INTRAMUSCULAR
  Filled 2017-02-27: qty 1

## 2017-02-27 MED ORDER — FLUTICASONE-SALMETEROL 250-50 MCG/DOSE IN AEPB: 1.0000 | INHALATION_SPRAY | Freq: Two times a day (BID) | RESPIRATORY_TRACT | 5 refills | Status: DC

## 2017-02-27 NOTE — Telephone Encounter (Signed)
[facility] - [facility] [place]  Patient Name: Leah [name]  DOB: 08/27/1945    Initial Comment [name] states she had an allergic reaction last night to Levoquin, took benadryl, face and arms are red, stopped last night, and doing it again this morning along with feeling nervous.    Nurse Assessment  Nurse: Yolanda Bonine, RN, Erin Date/Time (Eastern Time): 02/27/2017 8:32:47 AM  Confirm and document reason for call. If symptomatic, describe symptoms. ---[name] states she had an allergic reaction last night to Levaquin that she started yesterday, took benadryl 50mg . face and arms got red 1 hour after taking the medication States she has taken this medication before and had no reaction. Face and arms are burning but not red now and she feel neverous. Was awake all night after taking benadryl. Started Levaquin for bronchitis. Also RX'd Spiriva and albuterol but has not used them at all  Does the patient have any new or worsening symptoms? ---Yes  Will a triage be completed? ---Yes  Related visit to physician within the last 2 weeks? ---Yes  Does the PT have any chronic conditions? (i.e. diabetes, asthma, etc.) ---Yes  List chronic conditions. ---HTN Panic Attacks Anxeity  Is this a behavioral health or substance abuse call? ---No     Guidelines    Guideline Title Affirmed Question Affirmed Notes  Rash - Widespread On Drugs [1] Taking new prescription medication AND [2] rash within 4 hours of 1st dose    Final Disposition User   See Physician within 4 Hours (or PCP triage) Yolanda Bonine, RN, Junie Panning    Comments  Did not take ativan or zoloft last night due to taking benadryl and the allergic reaction  NOTE: appointment made with Justine Null, MD for 1115am 02/27/2017 Nurse E. Yolanda Bonine triaged patient; Lyndon Code, RN BSN entered data into Epic   Referrals  REFERRED TO PCP OFFICE  REFERRED TO PCP OFFICE   Disagree/Comply: Comply

## 2017-02-27 NOTE — Telephone Encounter (Signed)
Patient Name: Leah [name]  DOB: 1946/05/10    Initial Comment [name] states she is having an allergic reaction from a medication she was prescribed yesterday. Her face and arms got red and she was burning up. She took Benadryl and it went away. She saw doctor today and she was prescribed hydroxyzine HCL 10 mg. She is having an allergic reaction to this medication too. She feels real nervous and achy. She is also having chills.    Nurse Assessment  Nurse: Tawanna Solo, RN, Vaughan Basta Date/Time (Eastern Time): 02/27/2017 4:34:22 PM  Confirm and document reason for call. If symptomatic, describe symptoms. ---[name] says she had an allergic rxn to levaquin yesterday. Saw physician today and prescribed hydroxyzine HCL 10mg  and having facial redness, arm redness and feels nervous. These symptoms appeared 15 min after taking the medication.  Does the patient have any new or worsening symptoms? ---Yes  Will a triage be completed? ---Yes  Related visit to physician within the last 2 weeks? ---Yes  Does the PT have any chronic conditions? (i.e. diabetes, asthma, etc.) ---Yes  List chronic conditions. ---HTN high cholesterol chronic bronchitis  Is this a behavioral health or substance abuse call? ---No     Guidelines    Guideline Title Affirmed Question Affirmed Notes  Breathing Difficulty Extra heart beats OR irregular heart beating (i.e., "palpitations")    Final Disposition User   Go to ED Now Tawanna Solo, RN, Vaughan Basta    Comments  Having burning in arms and face.  [name] says feels like her heart is beating fast and has wheezing when she lays down.   Referrals  GO TO FACILITY UNDECIDED   Disagree/Comply: Comply

## 2017-02-27 NOTE — Telephone Encounter (Signed)
[facility] faxed a request for an alternative rx for [facility].  It is not covered by ins.    Alternatives: Breo Ellipta [phone] Advair [phone] Advair HFA [phone]

## 2017-02-27 NOTE — Telephone Encounter (Signed)
Patient called stating she was having a allergic reaction to hydroxyzine, her face is all red. Call transferred to team health

## 2017-02-27 NOTE — Telephone Encounter (Signed)
advair 250 / 50 1 inh bid #1  5 refills

## 2017-02-27 NOTE — Patient Instructions (Addendum)
You appear to have had allergic reaction to levofloxin. So stop as you have already done so. Also you had atypical response to benadryl. I have talked with pharmacist and they think reasonable to try hydroxyzine. You appear stable now and I feel best not to rx prednisone as you had reaction to that in the past as well.  For bronchitis will rx doxycycline in place of levofloxin. Rx advisement given on eating before use and rare side effect(note overcast weather and rain this week).  Continue your inhalers for copd.  Follow up in 7 days or as needed

## 2017-02-27 NOTE — ED Provider Notes (Signed)
[place] DEPT [facility] Provider Note   CSN: [id number] Arrival date & time: 02/27/17  1750     History   Chief Complaint Chief Complaint  Patient presents with  . Allergic Reaction    HPI Leah [name] is a 71 y.o. female.  HPI Patient reports that she started Levaquin yesterday for chronic bronchitis. She reports within 30 minutes she developed a red and burning quality rash on her inner arms and her face. Patient has had Levaquin multiple times in the past for COPD exacerbations. She reports that she tried some Benadryl yesterday evening for symptoms but that made her much worse. She reports Benadryl kept her awake all night long and she hasn't slept more than 45 minutes since yesterday. She saw her doctor today and was given hydroxyzine and doxycycline to try. She reports that she didn't take the doxycycline but try the hydroxyzine and she thinks that created the same symptoms. At this time, patient does not have the rash or flushing that have been coming and going since starting these medications. Past Medical History:  Diagnosis Date  . Anxiety   . Barrett esophagus   . Bladder pain   . Chronic bronchitis ([place])    per pt last episode july 2016  . Complication of anesthesia    slow to wake  . Depression   . Diverticulosis   . Frequency of urination   . GERD (gastroesophageal reflux disease)   . Hiatal hernia   . History of adenomatous polyp of colon    tubular adenoma  . History of chronic gastritis   . History of diverticulitis of colon   . History of esophageal dilatation    for stricture  . History of esophageal spasm   . Hyperlipidemia   . Hypertension   . IBS (irritable bowel syndrome)   . Interstitial cystitis   . Productive cough    intermittant  . Smokers' cough ([place])   . Urgency of urination     Patient Active Problem List   Diagnosis Date Noted  . Small bowel obstruction due to adhesions ([place]) 04/08/2016  . Frozen shoulder 04/27/2015  .  Hematoma 09/07/2014  . Cervical pain (neck) 05/19/2014  . Fatigue 05/08/2014  . Back pain 05/08/2014  . Pain in joint, upper arm 05/08/2014  . LLQ abdominal pain 03/02/2014  . History of diverticulitis 03/02/2014  . COPD exacerbation ([place]) 01/28/2014  . Oral thrush 01/28/2014  . Influenza 10/27/2013  . Appendicitis 09/19/2013  . Abdominal pain, left lower quadrant 03/05/2013  . HBP (high blood pressure) 02/07/2013  . Sinusitis, acute 01/15/2013  . COPD GOLD II 01/15/2013  . Smoker 12/20/2012  . Epigastric abdominal pain 09/03/2012  . Left lower quadrant pain 01/24/2011  . KNEE PAIN 06/07/2009  . ROSACEA 04/16/2009  . B12 DEFICIENCY 04/09/2009  . ANEMIA, IRON DEFICIENCY 03/19/2009  . MALAISE AND FATIGUE 03/19/2009  . NECK PAIN, LEFT 01/11/2009  . URI 07/30/2008  . Constipation 05/26/2008  . NAUSEA AND VOMITING 05/26/2008  . MUSCLE PAIN 04/27/2008  . VOMITING 04/17/2008  . Dysphagia 04/17/2008  . ANXIETY DEPRESSION 03/12/2008  . TREMOR 03/12/2008  . HYPERLIPIDEMIA 11/13/2007  . DEPRESSION 11/13/2007  . ESOPHAGEAL SPASM 11/13/2007  . GERD 11/13/2007  . IBS 11/13/2007  . CARPAL TUNNEL SYNDROME, HX OF 11/13/2007  . Diverticulosis of colon (without mention of hemorrhage) 03/14/2007  . GASTRITIS, CHRONIC 04/10/2002  . HIATAL HERNIA 04/10/2002    Past Surgical History:  Procedure Laterality Date  . ABDOMINAL HYSTERECTOMY  1975  .  ANTERIOR CERVICAL DECOMP/DISCECTOMY FUSION N/A 09/07/2014   Procedure: Exploration of Fusion and Removal of Anterior Cervical Hematoma;  Surgeon: Newman Pies, MD;  Location[facility] NEURO ORS;  Service: Neurosurgery;  Laterality: N/A;  . ANTERIOR FUSION CERVICAL SPINE  09/03/2014   C5 -- C7  . APPENDECTOMY    . CARPAL TUNNEL RELEASE Right 1993  . COLON SURGERY    . COLONOSCOPY  last one 04-30-2013  . CYSTO WITH HYDRODISTENSION N/A 06/08/2015   Procedure: CYSTOSCOPY/HYDRODISTENSION INSTILLATION OF MARCAINE AND PYRIDIUM;  Surgeon: Irine Seal, MD;   Location: [facility];  Service: Urology;  Laterality: N/A;  . CYSTO/  HOD/  BLADDER BX  1990's  . ESOPHAGOGASTRODUODENOSCOPY  last one 09-11-2012  . LAPAROSCOPIC APPENDECTOMY N/A 09/21/2013   Procedure: APPENDECTOMY LAPAROSCOPIC;  Surgeon: Rolm Bookbinder, MD;  Location: [facility];  Service: General;  Laterality: N/A;  . LAPAROSCOPIC CHOLECYSTECTOMY  2002  . LAPAROSCOPY SIGMOID COLECTOMY  10-05-2008   diverticulitis  . MICROLARYNGOSCOPY  09-27-2006   w/ true vocal cord stripping  and bilateral bx's of lesion's (benign)  . SINUS SURGERY WITH [place]  . [place]  . TONSILLECTOMY AND ADENOIDECTOMY  1964    OB History    No data available       Home Medications    Prior to Admission medications   Medication Sig Start Date End Date Taking? Authorizing Provider  albuterol (PROVENTIL) (2.5 MG/3ML) 0.083% nebulizer solution Inhale 3 mLs (2.5 mg total) into the lungs every 4 (four) hours as needed for wheezing or shortness of breath. q 4-6 hours as needed 02/20/17  Yes Lowne Lyndal Pulley R, DO  atorvastatin (LIPITOR) 80 MG tablet Take 1 tablet (80 mg total) by mouth daily. 02/22/17  Yes Roma Schanz R, DO  azelastine (ASTELIN) 0.1 % nasal spray Place 2 sprays into both nostrils 2 (two) times daily. Use in each nostril as directed 11/07/16  Yes Carollee Herter, Alferd Apa, DO  esomeprazole (NEXIUM) 40 MG capsule Take 1 capsule (40 mg total) by mouth 2 (two) times daily before a meal. 02/26/17  Yes Carollee Herter, Alferd Apa, DO  ezetimibe (ZETIA) 10 MG tablet Take 1 tablet (10 mg total) by mouth daily. 05/23/16  Yes Roma Schanz R, DO  fenofibrate 160 MG tablet Take 1 tablet (160 mg total) by mouth daily. 11/09/16  Yes Roma Schanz R, DO  fluticasone (FLONASE) 50 MCG/ACT nasal spray Place 2 sprays into both nostrils daily. 04/13/15  Yes Roma Schanz R, DO  Fluticasone-Salmeterol (ADVAIR DISKUS) 250-50 MCG/DOSE AEPB Inhale 1 puff into  the lungs 2 (two) times daily. 02/27/17  Yes Roma Schanz R, DO  hydrOXYzine (ATARAX/VISTARIL) 10 MG tablet 1-2 tab po tid as needed rash or itching 02/27/17  Yes Saguier, Percell Miller, PA-C  levofloxacin (LEVAQUIN) 500 MG tablet Take 1 tablet (500 mg total) by mouth daily. 02/26/17  Yes [name], DO  aspirin EC 81 MG tablet Take 1 tablet (81 mg total) by mouth daily. Over the counter 04/12/16   Rai, Vernelle Emerald, MD  cyclobenzaprine (FLEXERIL) 5 MG tablet Take 1 tablet (5 mg total) by mouth 3 (three) times daily. 04/12/16   Rai, Vernelle Emerald, MD  doxycycline (VIBRA-TABS) 100 MG tablet Take 1 tablet (100 mg total) by mouth 2 (two) times daily. 02/27/17   Saguier, Percell Miller, PA-C  estradiol (CLIMARA - DOSED IN MG/24 HR) 0.1 mg/24hr patch Place 0.1 mg onto the skin every Monday. 02/08/16  [provider]  ibuprofen (ADVIL,MOTRIN) 200 MG tablet Take 400 mg by mouth every 6 (six) hours as needed (pain).    [provider]  lisinopril (PRINIVIL,ZESTRIL) 20 MG tablet Take 1 tablet (20 mg total) by mouth daily. 07/10/16   Saguier, Percell Miller, PA-C  LORazepam (ATIVAN) 0.5 MG tablet Take 0.5 mg by mouth at bedtime.  12/04/14   [provider]  polyethylene glycol (MIRALAX / GLYCOLAX) packet Take 17 g by mouth daily as needed for moderate constipation. 04/12/16   Rai, Ripudeep K, MD  senna-docusate (SENOKOT-S) 8.6-50 MG tablet Take 1 tablet by mouth at bedtime. 04/12/16   Rai, Ripudeep Raliegh Ip, MD  sertraline (ZOLOFT) 50 MG tablet Take 50 mg by mouth at bedtime.    [provider]  sodium chloride (OCEAN) 0.65 % SOLN nasal spray Place 1 spray into both nostrils daily as needed for congestion.    [provider]  tiotropium (SPIRIVA HANDIHALER) 18 MCG inhalation capsule Place 1 capsule (18 mcg total) into inhaler and inhale daily. 02/26/17   [name], DO  tiZANidine (ZANAFLEX) 4 MG capsule Take 1 capsule (4 mg total) by mouth 3 (three) times daily as needed for muscle  spasms. 11/07/16   [name], DO    Family History Family History  Problem Relation Age of Onset  . Pancreatic cancer Mother   . Colon polyps Mother   . Pancreatic cancer Father   . Diabetes Father   . Heart disease Father   . Leukemia Father   . Prostate cancer Father   . Hypertension Father   . Colon cancer Neg Hx     Social History Social History  Substance Use Topics  . Smoking status: Current Every Day Smoker    Packs/day: 1.00    Years: 47.00    Types: Cigarettes  . Smokeless tobacco: Never Used     Comment: form given 02/17/16  . Alcohol use No     Allergies   Amoxicillin-pot clavulanate; Avelox [moxifloxacin hcl in nacl]; Ciprofloxacin; Keflex [cephalexin]; Moxifloxacin; Penicillins; Sulfa antibiotics; Prednisone; and Acyclovir and related   Review of Systems Review of Systems 10 Systems reviewed and are negative for acute change except as noted in the HPI.   Physical Exam Updated Vital Signs BP (!) 149/73   Pulse 78   Temp 98.4 F (36.9 C) (Oral)   Resp 18   Ht 5\' 1"  (1.549 m)   Wt 48.1 kg (106 lb)   SpO2 100%   BMI 20.03 kg/m   Physical Exam  Constitutional: She is oriented to person, place, and time.  Patient is slender. She is nontoxic and alert. She has no respiratory distress. She is clinically well and appearance.  HENT:  Head: Normocephalic and atraumatic.  Mouth/Throat: Oropharynx is clear and moist.  Mucous membranes pink and moist posterior oropharynx widely patent. No facial swelling or flushing.  Eyes: EOM are normal.  Cardiovascular: Normal rate, regular rhythm, normal heart sounds and intact distal pulses.   Pulmonary/Chest:  No respiratory distress. Soft breath sounds consistent with COPD. Expiratory wheeze.  Musculoskeletal: Normal range of motion. She exhibits no edema or tenderness.  Neurological: She is alert and oriented to person, place, and time. No cranial nerve deficit. She exhibits normal muscle tone.  Coordination normal.  Skin: Skin is warm and dry.  No rashes present at this time. Patient does appear to be fairly tanned on the face and chest. (She reports she was at the beach last week).  At this time however there is no diffuse flush or erythema. There are no hives. Back and lower extremities free of any rash or lesions  Psychiatric: She has a normal mood and affect.     ED Treatments / Results  Labs (all labs ordered are listed, but only abnormal results are displayed) Labs Reviewed - No data to display  EKG  EKG Interpretation None       Radiology No results found.  Procedures Procedures (including critical care time)  Medications Ordered in ED Medications  dexamethasone (DECADRON) injection 10 mg (10 mg Intramuscular Given 02/27/17 1830)     Initial Impression / Assessment and Plan / ED Course  I have reviewed the triage vital signs and the nursing notes.  Pertinent labs & imaging results that were available during my care of the patient were reviewed by me and considered in my medical decision making (see chart for details).      Final Clinical Impressions(s) / ED Diagnoses   Final diagnoses:  Allergic reaction, initial encounter  At this time, it is unclear what is causing the patient's sporadic flushing symptom. This does seem atypical for allergic reaction particularly as the redness and flushing or occurring on the medial aspects of her arms and her face only. Patient does not have any symptoms at this time. She identifies it as starting within 30 minutes of taking a dose of Levaquin, medication which she has had multiple times previously. She also associates it with her dose of hydroxyzine. At this time, I do not have other explanation. Patient for she cannot take oral steroids. She was given 1 dose of IM Decadron here in the emergency department. She will avoid Levaquin and hydroxyzine as she feels that these are precipitating her symptoms. He is counseled to  continue all of her other regular medications as prescribed.  New Prescriptions New Prescriptions   No medications on file     Charlesetta Shanks, MD 02/27/17 [phone]

## 2017-02-27 NOTE — Discharge Instructions (Signed)
You may have had an allergic reaction to Levaquin. You have had Levaquin in the past without similar reaction. The symptoms you described are somewhat atypical for an allergic reaction.  Do not resume the Levaquin.  You have been given a dose of steroids in the emergency department, Decadron 10 mg IM. This is to help with both allergic reaction as well as your COPD.  See your doctor again at the end of the week for recheck to make sure that your COPD is controlled.

## 2017-02-27 NOTE — Progress Notes (Signed)
Subjective:    Patient ID: Leah [name], female    DOB: [birth year], 71 y.o.   MRN: [id number]  HPI  Pt in states she was seen yesterday and give levofloxin for copd excacerbation. 30 minutes after taking levofloxin at 7 pm  she had face, redness, burning sensation and mid itch. Pt called ED and they advised benadryl and told her to be seen next day. No described anaphylactic type reaction. Pt in past had used levofloxin with no reaction. No other new med yesterday. No other suspicious exposure on review.   Pt even reports prior reaction to prednisone in the past.   Pt has several allergy/side effects to antibiotics from quinolones, pcn, keglex and sulfa. She has been on doxycycline before in the past with no reaction.   Pt never developed severe reaction yesterday. No report of anaphylactic type reactions.  Some recent productive cough. See yesterday note. I reviewed.   Pt thinks benadryl helped but caused her insomnia and made her nervous.   Review of Systems  Constitutional: Positive for chills.       Similar ros as yesterday.  HENT: Positive for congestion and sinus pressure. Negative for sinus pain and sore throat.   Respiratory: Positive for cough and wheezing. Negative for shortness of breath.        Some wheezing baseline hx. Not worse since allergic reaction.  Cardiovascular: Negative for chest pain and palpitations.  Gastrointestinal: Negative for abdominal pain.  Musculoskeletal: Negative for back pain and gait problem.  Skin: Positive for rash.       Faint itch yesterday but not now  Neurological: Negative for dizziness and headaches.  Hematological: Negative for adenopathy. Does not bruise/bleed easily.  Psychiatric/Behavioral: Negative for behavioral problems.    Past Medical History:  Diagnosis Date  . Anxiety   . Barrett esophagus   . Bladder pain   . Chronic bronchitis ([place])    per pt last episode july 2016  . Complication of anesthesia    slow to  wake  . Depression   . Diverticulosis   . Frequency of urination   . GERD (gastroesophageal reflux disease)   . Hiatal hernia   . History of adenomatous polyp of colon    tubular adenoma  . History of chronic gastritis   . History of diverticulitis of colon   . History of esophageal dilatation    for stricture  . History of esophageal spasm   . Hyperlipidemia   . Hypertension   . IBS (irritable bowel syndrome)   . Interstitial cystitis   . Productive cough    intermittant  . Smokers' cough ([place])   . Urgency of urination      Social History   Social History  . Marital status: Married    Spouse name: N/A  . Number of children: N/A  . Years of education: N/A   Occupational History  . Not on file.   Social History Main Topics  . Smoking status: Current Every Day Smoker    Packs/day: 1.00    Years: 47.00    Types: Cigarettes  . Smokeless tobacco: Never Used     Comment: form given 02/17/16  . Alcohol use No  . Drug use: No  . Sexual activity: Not Currently    Birth control/ protection: Post-menopausal   Other Topics Concern  . Not on file   Social History Narrative  . No narrative on file    Past Surgical History:  Procedure Laterality Date  .  ABDOMINAL HYSTERECTOMY  1975  . ANTERIOR CERVICAL DECOMP/DISCECTOMY FUSION N/A 09/07/2014   Procedure: Exploration of Fusion and Removal of Anterior Cervical Hematoma;  Surgeon: Newman Pies, MD;  Location: [facility] NEURO [facility]S;  Service: Neurosurgery;  Laterality: N/A;  . ANTERIOR FUSION CERVICAL SPINE  09/03/2014   C5 -- C7  . APPENDECTOMY    . CARPAL TUNNEL RELEASE Right 1993  . COLON SURGERY    . COLONOSCOPY  last one 04-30-2013  . CYSTO WITH HYDRODISTENSION N/A 06/08/2015   Procedure: CYSTOSCOPY/HYDRODISTENSION INSTILLATION OF MARCAINE AND PYRIDIUM;  Surgeon: Irine Seal, MD;  Location: [facility];  Service: Urology;  Laterality: N/A;  . CYSTO/  HOD/  BLADDER BX  1990's  . ESOPHAGOGASTRODUODENOSCOPY  last  one 09-11-2012  . LAPAROSCOPIC APPENDECTOMY N/A 09/21/2013   Procedure: APPENDECTOMY LAPAROSCOPIC;  Surgeon: Rolm Bookbinder, MD;  Location: [place];  Service: General;  Laterality: N/A;  . LAPAROSCOPIC CHOLECYSTECTOMY  2002  . LAPAROSCOPY SIGMOID COLECTOMY  10-05-2008   diverticulitis  . MICROLARYNGOSCOPY  09-27-2006   w/ true vocal cord stripping  and bilateral bx's of lesion's (benign)  . SINUS SURGERY WITH [place]  . [place]  . TONSILLECTOMY AND ADENOIDECTOMY  1964    Family History  Problem Relation Age of Onset  . Pancreatic cancer Mother   . Colon polyps Mother   . Pancreatic cancer Father   . Diabetes Father   . Heart disease Father   . Leukemia Father   . Prostate cancer Father   . Hypertension Father   . Colon cancer Neg Hx     Allergies  Allergen Reactions  . Amoxicillin-Pot Clavulanate Shortness Of Breath, Itching and Swelling  . Avelox [Moxifloxacin Hcl In Nacl] Swelling    Swelling, itching and shortness of breath  . Ciprofloxacin Shortness Of Breath, Itching and Swelling  . Keflex [Cephalexin] Shortness Of Breath and Swelling  . Moxifloxacin Shortness Of Breath, Itching and Swelling  . Penicillins Shortness Of Breath, Itching and Swelling  . Sulfa Antibiotics Shortness Of Breath, Itching and Swelling  . Prednisone Other (See Comments)    GI irritation; is able to tolerate injections  . Acyclovir And Related Other (See Comments)    Pt does not recall this reaction    Current Outpatient Prescriptions on File Prior to Visit  Medication Sig Dispense Refill  . albuterol (PROVENTIL) (2.5 MG/3ML) 0.083% nebulizer solution Inhale 3 mLs (2.5 mg total) into the lungs every 4 (four) hours as needed for wheezing or shortness of breath. q 4-6 hours as needed 75 mL 0  . aspirin EC 81 MG tablet Take 1 tablet (81 mg total) by mouth daily. Over the counter    . atorvastatin (LIPITOR) 80 MG tablet Take 1 tablet (80 mg total) by mouth  daily. 90 tablet 1  . azelastine (ASTELIN) 0.1 % nasal spray Place 2 sprays into both nostrils 2 (two) times daily. Use in each nostril as directed 30 mL 3  . cyclobenzaprine (FLEXERIL) 5 MG tablet Take 1 tablet (5 mg total) by mouth 3 (three) times daily. 90 tablet 1  . esomeprazole (NEXIUM) 40 MG capsule Take 1 capsule (40 mg total) by mouth 2 (two) times daily before a meal. 180 capsule 3  . estradiol (CLIMARA - DOSED IN MG/24 HR) 0.1 mg/24hr patch Place 0.1 mg onto the skin every Monday.  0  . ezetimibe (ZETIA) 10 MG tablet Take 1 tablet (10 mg total) by mouth daily. 90 tablet 1  .  fenofibrate 160 MG tablet Take 1 tablet (160 mg total) by mouth daily. 30 tablet 3  . fluticasone (FLONASE) 50 MCG/ACT nasal spray Place 2 sprays into both nostrils daily. 16 g 6  . ibuprofen (ADVIL,MOTRIN) 200 MG tablet Take 400 mg by mouth every 6 (six) hours as needed (pain).    Marland Kitchen levofloxacin (LEVAQUIN) 500 MG tablet Take 1 tablet (500 mg total) by mouth daily. 10 tablet 0  . lisinopril (PRINIVIL,ZESTRIL) 20 MG tablet Take 1 tablet (20 mg total) by mouth daily. 30 tablet 3  . LORazepam (ATIVAN) 0.5 MG tablet Take 0.5 mg by mouth at bedtime.   5  . mometasone-formoterol (DULERA) 200-5 MCG/ACT AERO 2 puffs bid 1 Inhaler 2  . polyethylene glycol (MIRALAX / GLYCOLAX) packet Take 17 g by mouth daily as needed for moderate constipation. 14 each 0  . senna-docusate (SENOKOT-S) 8.6-50 MG tablet Take 1 tablet by mouth at bedtime. 30 tablet 0  . sertraline (ZOLOFT) 50 MG tablet Take 50 mg by mouth at bedtime.    . sodium chloride (OCEAN) 0.65 % SOLN nasal spray Place 1 spray into both nostrils daily as needed for congestion.    Marland Kitchen tiotropium (SPIRIVA HANDIHALER) 18 MCG inhalation capsule Place 1 capsule (18 mcg total) into inhaler and inhale daily. 90 capsule 1  . tiZANidine (ZANAFLEX) 4 MG capsule Take 1 capsule (4 mg total) by mouth 3 (three) times daily as needed for muscle spasms. 30 capsule 0   No current  facility-administered medications on file prior to visit.     BP 137/64   Pulse 65   Temp 98.7 F (37.1 C) (Oral)   Resp 16   Ht 5\' 1"  (1.549 m)   Wt 106 lb 12.8 oz (48.4 kg)   SpO2 100%   BMI 20.18 kg/m       Objective:   Physical Exam  General  Mental Status - Alert. General Appearance - Well groomed. Not in acute distress.  Skin Rashes- No Rashes.  HEENT Head- Normal. Ear Auditory Canal - Left- Normal. Right - Normal.Tympanic Membrane- Left- Normal. Right- Normal. Eye Sclera/Conjunctiva- Left- Normal. Right- Normal. Nose & Sinuses Nasal Mucosa- Left-  Boggy and Congested. Right-  Boggy and  Congested. Left side left  maxillary and no frontal sinus pressure. Mouth & Throat Lips: Upper Lip- Normal: no dryness, cracking, pallor, cyanosis, or vesicular eruption. Lower Lip-Normal: no dryness, cracking, pallor, cyanosis or vesicular eruption. Buccal Mucosa- Bilateral- No Aphthous ulcers. Oropharynx- No Discharge or Erythema. Tonsils: Characteristics- Bilateral- No Erythema or Congestion. Size/Enlargement- Bilateral- No enlargement. Discharge- bilateral-None.  Neck Neck- Supple. No Masses.   Chest and Lung Exam Auscultation: Breath Sounds:-Clear even and unlabored.  Cardiovascular Auscultation:Rythm- Regular, rate and rhythm. Murmurs & Other Heart Sounds:Ausculatation of the heart reveal- No Murmurs.  Lymphatic Head & Neck General Head & Neck Lymphatics: Bilateral: Description- No Localized lymphadenopathy.  Derm- faint upper chest mild rash/pinkish red. But otherwise normal. No lip swelling.       Assessment & Plan:  You appear to have had allergic reaction to levofloxin. So stop as you have already done so. Also you had atypical response to benadryl. I have talked with pharmacist and they think reasonable to try hydroxyzine. You appear stable now and I feel best not to rx prednisone as you had reaction to that in the past as well.  For bronchitis will rx  doxycycline in place of levofloxin. Rx advisement given on eating before use and rare side effect(note overcast weather and  rain this week).  Continue your inhalers for copd.  Follow up in 7 days or as needed  [place], Percell Miller, [employer]

## 2017-02-27 NOTE — ED Triage Notes (Signed)
Pt reports being treated for bronchitis with Levofloxacin 7/30. She reports facial and arm burning/redness with the meds. Today she was seen for symptoms and given Hydroxyzine and doxycycline. Pt states she has only taken the Hydroxyzine. States she is still hot and burning and short of breath. Speaking clear sentences with equal unlabored respirations.

## 2017-02-27 NOTE — Telephone Encounter (Signed)
Noted. Appointment with Mackie Pai, PA-C at 11:15 AM today.

## 2017-02-27 NOTE — Telephone Encounter (Signed)
New rx sent to pharmacy

## 2017-03-01 ENCOUNTER — Encounter: Payer: Self-pay | Admitting: Medical

## 2017-03-01 ENCOUNTER — Ambulatory Visit (INDEPENDENT_AMBULATORY_CARE_PROVIDER_SITE_OTHER): Payer: BLUE CROSS/BLUE SHIELD | Admitting: Medical

## 2017-03-01 ENCOUNTER — Ambulatory Visit (HOSPITAL_BASED_OUTPATIENT_CLINIC_OR_DEPARTMENT_OTHER)
Admission: RE | Admit: 2017-03-01 | Discharge: 2017-03-01 | Disposition: A | Discharge status: Home / Self Care | Payer: BLUE CROSS/BLUE SHIELD | Source: Ambulatory Visit | Attending: Medical | Admitting: Medical

## 2017-03-01 VITALS — BP 113/64 | HR 77 | Temp 98.6°F | Resp 16 | Ht 61.0 in | Wt 108.2 lb

## 2017-03-01 DIAGNOSIS — J42 Unspecified chronic bronchitis: Secondary | ICD-10-CM | POA: Diagnosis not present

## 2017-03-01 DIAGNOSIS — R062 Wheezing: Secondary | ICD-10-CM | POA: Diagnosis present

## 2017-03-01 DIAGNOSIS — T7840XD Allergy, unspecified, subsequent encounter: Secondary | ICD-10-CM | POA: Diagnosis not present

## 2017-03-01 NOTE — Progress Notes (Signed)
Subjective:    Patient ID: Leah [name], female    DOB: 1945/10/11, 71 y.o.   MRN: [id number]  HPI  Pt in for follow up.  Pt had recent probable allergic reaction to levofloxin and then hydoxyzine. See recent ED note and my last note.  She went to the ED same night I saw her. Rash was mild and atypical per ED. They gave decadron im. By 8-10 hour later rash resolve and has not returned.  Pt based on her reactions in past she was reluctant to use doxycycline though no prior reaction to doxycycline in the past. I had written due to concern for bronchitis.  Some recent mild rt side back pain since 24 th. Pt thinks pain slight worse now. No skin rash in area of pain.  Pt nebulizer machine stopped working day before yesterday. She was using her neb machine twice a day. Still wheezing intermittently.    Review of Systems  Constitutional: Negative for chills, fatigue and fever.  Respiratory: Positive for cough and wheezing. Negative for chest tightness and shortness of breath.        Cough is still productive.  Cardiovascular: Negative for chest pain and palpitations.  Gastrointestinal: Negative for abdominal pain, constipation, diarrhea, nausea and vomiting.  Musculoskeletal: Negative for back pain and joint swelling.  Skin: Positive for rash.       No rash currenlty. No itching.   Neurological: Negative for dizziness, seizures, speech difficulty, weakness, light-headedness and headaches.  Hematological: Negative for adenopathy. Does not bruise/bleed easily.  Psychiatric/Behavioral: Negative for behavioral problems and confusion.   Past Medical History:  Diagnosis Date  . Anxiety   . Barrett esophagus   . Bladder pain   . Chronic bronchitis ([place])    per pt last episode july 2016  . Complication of anesthesia    slow to wake  . Depression   . Diverticulosis   . Frequency of urination   . GERD (gastroesophageal reflux disease)   . Hiatal hernia   . History of adenomatous  polyp of colon    tubular adenoma  . History of chronic gastritis   . History of diverticulitis of colon   . History of esophageal dilatation    for stricture  . History of esophageal spasm   . Hyperlipidemia   . Hypertension   . IBS (irritable bowel syndrome)   . Interstitial cystitis   . Productive cough    intermittant  . Smokers' cough ([place])   . Urgency of urination      Social History   Social History  . Marital status: Married    Spouse name: N/A  . Number of children: N/A  . Years of education: N/A   Occupational History  . Not on file.   Social History Main Topics  . Smoking status: Current Every Day Smoker    Packs/day: 1.00    Years: 47.00    Types: Cigarettes  . Smokeless tobacco: Never Used     Comment: form given 02/17/16  . Alcohol use No  . Drug use: No  . Sexual activity: Not Currently    Birth control/ protection: Post-menopausal   Other Topics Concern  . Not on file   Social History Narrative  . No narrative on file    Past Surgical History:  Procedure Laterality Date  . ABDOMINAL HYSTERECTOMY  1975  . ANTERIOR CERVICAL DECOMP/DISCECTOMY FUSION N/A 09/07/2014   Procedure: Exploration of Fusion and Removal of Anterior Cervical Hematoma;  Surgeon: Dellis Filbert  Arnoldo Morale, MD;  Location: [place] NEURO ORS;  Service: Neurosurgery;  Laterality: N/A;  . ANTERIOR FUSION CERVICAL SPINE  09/03/2014   C5 -- C7  . APPENDECTOMY    . CARPAL TUNNEL RELEASE Right 1993  . COLON SURGERY    . COLONOSCOPY  last one 04-30-2013  . CYSTO WITH HYDRODISTENSION N/A 06/08/2015   Procedure: CYSTOSCOPY/HYDRODISTENSION INSTILLATION OF MARCAINE AND PYRIDIUM;  Surgeon: Irine Seal, MD;  Location: [facility];  Service: Urology;  Laterality: N/A;  . CYSTO/  HOD/  BLADDER BX  1990's  . ESOPHAGOGASTRODUODENOSCOPY  last one 09-11-2012  . LAPAROSCOPIC APPENDECTOMY N/A 09/21/2013   Procedure: APPENDECTOMY LAPAROSCOPIC;  Surgeon: Rolm Bookbinder, MD;  Location: [place];   Service: General;  Laterality: N/A;  . LAPAROSCOPIC CHOLECYSTECTOMY  2002  . LAPAROSCOPY SIGMOID COLECTOMY  10-05-2008   diverticulitis  . MICROLARYNGOSCOPY  09-27-2006   w/ true vocal cord stripping  and bilateral bx's of lesion's (benign)  . SINUS SURGERY WITH [place]  . [place]  . TONSILLECTOMY AND ADENOIDECTOMY  1964    Family History  Problem Relation Age of Onset  . Pancreatic cancer Mother   . Colon polyps Mother   . Pancreatic cancer Father   . Diabetes Father   . Heart disease Father   . Leukemia Father   . Prostate cancer Father   . Hypertension Father   . Colon cancer Neg Hx     Allergies  Allergen Reactions  . Amoxicillin-Pot Clavulanate Shortness Of Breath, Itching and Swelling  . Avelox [Moxifloxacin Hcl In Nacl] Swelling    Swelling, itching and shortness of breath  . Ciprofloxacin Shortness Of Breath, Itching and Swelling  . Keflex [Cephalexin] Shortness Of Breath and Swelling  . Moxifloxacin Shortness Of Breath, Itching and Swelling  . Penicillins Shortness Of Breath, Itching and Swelling  . Sulfa Antibiotics Shortness Of Breath, Itching and Swelling  . Prednisone Other (See Comments)    GI irritation; is able to tolerate injections  . Acyclovir And Related Other (See Comments)    Pt does not recall this reaction    Current Outpatient Prescriptions on File Prior to Visit  Medication Sig Dispense Refill  . albuterol (PROVENTIL) (2.5 MG/3ML) 0.083% nebulizer solution Inhale 3 mLs (2.5 mg total) into the lungs every 4 (four) hours as needed for wheezing or shortness of breath. q 4-6 hours as needed 75 mL 0  . aspirin EC 81 MG tablet Take 1 tablet (81 mg total) by mouth daily. Over the counter    . atorvastatin (LIPITOR) 80 MG tablet Take 1 tablet (80 mg total) by mouth daily. 90 tablet 1  . azelastine (ASTELIN) 0.1 % nasal spray Place 2 sprays into both nostrils 2 (two) times daily. Use in each nostril as directed 30 mL  3  . cyclobenzaprine (FLEXERIL) 5 MG tablet Take 1 tablet (5 mg total) by mouth 3 (three) times daily. 90 tablet 1  . doxycycline (VIBRA-TABS) 100 MG tablet Take 1 tablet (100 mg total) by mouth 2 (two) times daily. 14 tablet 0  . esomeprazole (NEXIUM) 40 MG capsule Take 1 capsule (40 mg total) by mouth 2 (two) times daily before a meal. 180 capsule 3  . estradiol (CLIMARA - DOSED IN MG/24 HR) 0.1 mg/24hr patch Place 0.1 mg onto the skin every Monday.  0  . ezetimibe (ZETIA) 10 MG tablet Take 1 tablet (10 mg total) by mouth daily. 90 tablet 1  . fenofibrate 160 MG tablet Take  1 tablet (160 mg total) by mouth daily. 30 tablet 3  . fluticasone (FLONASE) 50 MCG/ACT nasal spray Place 2 sprays into both nostrils daily. 16 g 6  . Fluticasone-Salmeterol (ADVAIR DISKUS) 250-50 MCG/DOSE AEPB Inhale 1 puff into the lungs 2 (two) times daily. 1 each 5  . hydrOXYzine (ATARAX/VISTARIL) 10 MG tablet 1-2 tab po tid as needed rash or itching 60 tablet 0  . ibuprofen (ADVIL,MOTRIN) 200 MG tablet Take 400 mg by mouth every 6 (six) hours as needed (pain).    Marland Kitchen levofloxacin (LEVAQUIN) 500 MG tablet Take 1 tablet (500 mg total) by mouth daily. 10 tablet 0  . lisinopril (PRINIVIL,ZESTRIL) 20 MG tablet Take 1 tablet (20 mg total) by mouth daily. 30 tablet 3  . LORazepam (ATIVAN) 0.5 MG tablet Take 0.5 mg by mouth at bedtime.   5  . polyethylene glycol (MIRALAX / GLYCOLAX) packet Take 17 g by mouth daily as needed for moderate constipation. 14 each 0  . senna-docusate (SENOKOT-S) 8.6-50 MG tablet Take 1 tablet by mouth at bedtime. 30 tablet 0  . sertraline (ZOLOFT) 50 MG tablet Take 50 mg by mouth at bedtime.    . sodium chloride (OCEAN) 0.65 % SOLN nasal spray Place 1 spray into both nostrils daily as needed for congestion.    Marland Kitchen tiotropium (SPIRIVA HANDIHALER) 18 MCG inhalation capsule Place 1 capsule (18 mcg total) into inhaler and inhale daily. 90 capsule 1  . tiZANidine (ZANAFLEX) 4 MG capsule Take 1 capsule (4 mg  total) by mouth 3 (three) times daily as needed for muscle spasms. 30 capsule 0   No current facility-administered medications on file prior to visit.     BP 113/64   Pulse 77   Temp 98.6 F (37 C) (Oral)   Resp 16   Ht 5\' 1"  (1.549 m)   Wt 108 lb 3.2 oz (49.1 kg)   SpO2 100%   BMI 20.44 kg/m       Objective:   Physical Exam  General Mental Status- Alert. General Appearance- Not in acute distress.   Skin General: Color- Normal Color. Moisture- Normal Moisture.  Neck Carotid Arteries- Normal color. Moisture- Normal Moisture. No carotid bruits. No JVD.  Chest and Lung Exam Auscultation: Breath Sounds:-even unlabored. Mild shallow.  Cardiovascular Auscultation:Rythm- Regular. Murmurs & Other Heart Sounds:Auscultation of the heart reveals- No Murmurs.  Abdomen Inspection:-Inspeection Normal. Palpation/Percussion:Note:No mass. Palpation and Percussion of the abdomen reveal- Non Tender, Non Distended + BS, no rebound or guarding.  Neurologic Cranial Nerve exam:- CN III-XII intact(No nystagmus), symmetric smile. Strength:- 5/5 equal and symmetric strength both upper and lower extremities.  Skin- no rash on are of lower rt side back pain. And no rash on arms or chest.      Assessment & Plan:  For recent bronchitis and some pain rt lower back will get xray to make sure no pneumonia.  I do think taking doxycycline is a good idea with same precautions I gave last visit.(for bronchitis)  No further hydroxyzine. Will go ahead and refer you to allergist.   Continue the inhalers as before.  Go by advance for machine we have sent in order. And tried to contact person who can expidite the request.  Follow up in 7 days or as needed  Yonatan Guitron, Percell Miller, [employer]

## 2017-03-01 NOTE — Patient Instructions (Addendum)
For recent bronchitis and some pain rt lower back will get xray to make sure no pneumonia.  I do think taking doxycycline is a good idea with same precautions I gave last visit.(for bronchitis)  No further hydroxyzine. Will go ahead and refer you to allergist.   Continue the inhalers as before.  Go by advance for machine we have sent in order. And tried to contact person who can expidite the request.  Follow up in 7 days or as needed

## 2017-03-02 ENCOUNTER — Telehealth: Payer: Self-pay | Admitting: Family Medicine

## 2017-03-02 NOTE — Telephone Encounter (Signed)
Pt returned call for results.

## 2017-03-02 NOTE — Telephone Encounter (Signed)
Notified pt of CXR result. She did nebulizer machine yesterday. States she still feels about the same. No better / no worse.

## 2017-03-06 ENCOUNTER — Encounter: Payer: Self-pay | Admitting: Internal Medicine

## 2017-03-06 ENCOUNTER — Ambulatory Visit (INDEPENDENT_AMBULATORY_CARE_PROVIDER_SITE_OTHER): Payer: BLUE CROSS/BLUE SHIELD | Admitting: Internal Medicine

## 2017-03-06 VITALS — BP 126/66 | HR 77 | Temp 98.7°F | Resp 14 | Ht 61.0 in | Wt 106.5 lb

## 2017-03-06 DIAGNOSIS — G500 Trigeminal neuralgia: Secondary | ICD-10-CM

## 2017-03-06 DIAGNOSIS — R51 Headache: Secondary | ICD-10-CM

## 2017-03-06 DIAGNOSIS — R519 Headache, unspecified: Secondary | ICD-10-CM

## 2017-03-06 NOTE — Patient Instructions (Signed)
We'll schedule a CAT scan  Continue using Flonase, Astelin every day  Tylenol for pain  If you have severe symptoms, fever, chills or a rash: Let us know or go to the ER

## 2017-03-06 NOTE — Progress Notes (Signed)
Subjective:    Patient ID: Leah [name], female    DOB: 08-Jul-1946, 71 y.o.   MRN: [id number]  DOS:  03/06/2017 Type of visit - description : acute Interval history: Her main concern today is sinus pain. Reports that she has pain at the left side of her face and forehead associated with left ear pain. Described the pain as deep, is not a superficial.  Her recent history is complicated. I review the notes from 02/20/2017, 02/26/2017, 02/27/2017; also  ER visit visit 02/27/2017 and OV note 03/01/2017   Has a flushing feeling at the face and arms, on and off, no associated rash.  Review of Systems Currently with no fever chills. Respiratory symptoms including cough and chest congestion are better. No visual disturbances  Past Medical History:  Diagnosis Date  . Anxiety   . Barrett esophagus   . Bladder pain   . Chronic bronchitis ([place])    per pt last episode july 2016  . Complication of anesthesia    slow to wake  . Depression   . Diverticulosis   . Frequency of urination   . GERD (gastroesophageal reflux disease)   . Hiatal hernia   . History of adenomatous polyp of colon    tubular adenoma  . History of chronic gastritis   . History of diverticulitis of colon   . History of esophageal dilatation    for stricture  . History of esophageal spasm   . Hyperlipidemia   . Hypertension   . IBS (irritable bowel syndrome)   . Interstitial cystitis   . Productive cough    intermittant  . Smokers' cough ([place])   . Urgency of urination     Past Surgical History:  Procedure Laterality Date  . ABDOMINAL HYSTERECTOMY  1975  . ANTERIOR CERVICAL DECOMP/DISCECTOMY FUSION N/A 09/07/2014   Procedure: Exploration of Fusion and Removal of Anterior Cervical Hematoma;  Surgeon: Newman Pies, MD;  Location: [facility] NEURO ORS;  Service: Neurosurgery;  Laterality: N/A;  . ANTERIOR FUSION CERVICAL SPINE  09/03/2014   C5 -- C7  . APPENDECTOMY    . CARPAL TUNNEL RELEASE Right 1993  . COLON  SURGERY    . COLONOSCOPY  last one 04-30-2013  . CYSTO WITH HYDRODISTENSION N/A 06/08/2015   Procedure: CYSTOSCOPY/HYDRODISTENSION INSTILLATION OF MARCAINE AND PYRIDIUM;  Surgeon: Irine Seal, MD;  Location: [facility];  Service: Urology;  Laterality: N/A;  . CYSTO/  HOD/  BLADDER BX  1990's  . ESOPHAGOGASTRODUODENOSCOPY  last one 09-11-2012  . LAPAROSCOPIC APPENDECTOMY N/A 09/21/2013   Procedure: APPENDECTOMY LAPAROSCOPIC;  Surgeon: Rolm Bookbinder, MD;  Location: [place];  Service: General;  Laterality: N/A;  . LAPAROSCOPIC CHOLECYSTECTOMY  2002  . LAPAROSCOPY SIGMOID COLECTOMY  10-05-2008   diverticulitis  . MICROLARYNGOSCOPY  09-27-2006   w/ true vocal cord stripping  and bilateral bx's of lesion's (benign)  . SINUS SURGERY WITH [place]  . [place]  . TONSILLECTOMY AND ADENOIDECTOMY  1964    Social History   Social History  . Marital status: Married    Spouse name: N/A  . Number of children: N/A  . Years of education: N/A   Occupational History  . Not on file.   Social History Main Topics  . Smoking status: Current Every Day Smoker    Packs/day: 1.00    Years: 47.00    Types: Cigarettes  . Smokeless tobacco: Never Used     Comment: form given 02/17/16  .  Alcohol use No  . Drug use: No  . Sexual activity: Not Currently    Birth control/ protection: Post-menopausal   Other Topics Concern  . Not on file   Social History Narrative  . No narrative on file      Allergies as of 03/06/2017      Reactions   Amoxicillin-pot Clavulanate Shortness Of Breath, Itching, Swelling   Avelox [moxifloxacin Hcl In Nacl] Swelling   Swelling, itching and shortness of breath   Ciprofloxacin Shortness Of Breath, Itching, Swelling   Keflex [cephalexin] Shortness Of Breath, Swelling   Moxifloxacin Shortness Of Breath, Itching, Swelling   Penicillins Shortness Of Breath, Itching, Swelling   Sulfa Antibiotics Shortness Of Breath,  Itching, Swelling   Prednisone Other (See Comments)   GI irritation; is able to tolerate injections   Acyclovir And Related Other (See Comments)   Pt does not recall this reaction      Medication List       Accurate as of 03/06/17 11:59 PM. Always use your most recent med list.          albuterol (2.5 MG/3ML) 0.083% nebulizer solution Commonly known as:  PROVENTIL Inhale 3 mLs (2.5 mg total) into the lungs every 4 (four) hours as needed for wheezing or shortness of breath. q 4-6 hours as needed   aspirin EC 81 MG tablet Take 1 tablet (81 mg total) by mouth daily. Over the counter   atorvastatin 80 MG tablet Commonly known as:  LIPITOR Take 1 tablet (80 mg total) by mouth daily.   azelastine 0.1 % nasal spray Commonly known as:  ASTELIN Place 2 sprays into both nostrils 2 (two) times daily. Use in each nostril as directed   cyclobenzaprine 5 MG tablet Commonly known as:  FLEXERIL Take 1 tablet (5 mg total) by mouth 3 (three) times daily.   esomeprazole 40 MG capsule Commonly known as:  NEXIUM Take 1 capsule (40 mg total) by mouth 2 (two) times daily before a meal.   estradiol 0.1 mg/24hr patch Commonly known as:  CLIMARA - Dosed in mg/24 hr Place 0.1 mg onto the skin every Monday.   ezetimibe 10 MG tablet Commonly known as:  ZETIA Take 1 tablet (10 mg total) by mouth daily.   fenofibrate 160 MG tablet Take 1 tablet (160 mg total) by mouth daily.   fluticasone 50 MCG/ACT nasal spray Commonly known as:  FLONASE Place 2 sprays into both nostrils daily.   Fluticasone-Salmeterol 250-50 MCG/DOSE Aepb Commonly known as:  ADVAIR DISKUS Inhale 1 puff into the lungs 2 (two) times daily.   hydrOXYzine 10 MG tablet Commonly known as:  ATARAX/VISTARIL 1-2 tab po tid as needed rash or itching   ibuprofen 200 MG tablet Commonly known as:  ADVIL,MOTRIN Take 400 mg by mouth every 6 (six) hours as needed (pain).   lisinopril 20 MG tablet Commonly known as:   PRINIVIL,ZESTRIL Take 1 tablet (20 mg total) by mouth daily.   LORazepam 0.5 MG tablet Commonly known as:  ATIVAN Take 0.5 mg by mouth at bedtime.   polyethylene glycol packet Commonly known as:  MIRALAX / GLYCOLAX Take 17 g by mouth daily as needed for moderate constipation.   senna-docusate 8.6-50 MG tablet Commonly known as:  Senokot-S Take 1 tablet by mouth at bedtime.   sertraline 50 MG tablet Commonly known as:  ZOLOFT Take 50 mg by mouth at bedtime.   sodium chloride 0.65 % Soln nasal spray Commonly known as:  OCEAN Place 1  spray into both nostrils daily as needed for congestion.   tiotropium 18 MCG inhalation capsule Commonly known as:  SPIRIVA HANDIHALER Place 1 capsule (18 mcg total) into inhaler and inhale daily.   tiZANidine 4 MG capsule Commonly known as:  ZANAFLEX Take 1 capsule (4 mg total) by mouth 3 (three) times daily as needed for muscle spasms.          Objective:   Physical Exam BP 126/66 (BP Location: Left Arm, Patient Position: Sitting, Cuff Size: Small)   Pulse 77   Temp 98.7 F (37.1 C) (Oral)   Resp 14   Ht 5\' 1"  (1.549 m)   Wt 106 lb 8 oz (48.3 kg)   SpO2 96%   BMI 20.12 kg/m   General:   Well developed, well nourished . NAD.  HEENT:  Normocephalic . Face symmetric, atraumatic.Nose is slightly congested Throat symmetric, no red. TMs normal. Lungs:  CTA B Normal respiratory effort, no intercostal retractions, no accessory muscle use. Heart: RRR,  no murmur.  No pretibial edema bilaterally  Neurologic:  alert & oriented X3.  Speech normal, gait appropriate for age and unassisted Strength symmetric and appropriate for age. Marland Kitchen EOMI Psych: Cognition and judgment appear intact.  Cooperative with normal attention span and concentration.  Behavior appropriate. No anxious or depressed appearing.    Assessment & Plan:   71 year old female with multiple medical conditions including high cholesterol, COPD, HTN, depression, IBS,  tobacco abuse presents with the following:  Facial pain: Patient presents with facial pain, DDX includes trigeminal neuralgia versus sinusitis versus others. Symptoms description not typical for neuralgia All this happening in the context of recent bronchitis, status post multiple office visits, multiple antibiotics and steroids. Apparently had a reaction to Levaquin. Still has a flushing feeling in both side of the face and arms. Unclear etiology Plan: CT face. Further advice which results.  Today, I spent more than  25  min with the patient: >50% of the time counseling regards  her symptoms aren't doing extensive chart review.

## 2017-03-06 NOTE — Progress Notes (Signed)
Pre visit review using our clinic review tool, if applicable. No additional management support is needed unless otherwise documented below in the visit note. 

## 2017-03-07 ENCOUNTER — Ambulatory Visit (HOSPITAL_BASED_OUTPATIENT_CLINIC_OR_DEPARTMENT_OTHER)
Admission: RE | Admit: 2017-03-07 | Discharge: 2017-03-07 | Disposition: A | Discharge status: Home / Self Care | Payer: BLUE CROSS/BLUE SHIELD | Source: Ambulatory Visit | Attending: Internal Medicine | Admitting: Internal Medicine

## 2017-03-07 DIAGNOSIS — G500 Trigeminal neuralgia: Secondary | ICD-10-CM | POA: Insufficient documentation

## 2017-03-07 DIAGNOSIS — Z9889 Other specified postprocedural states: Secondary | ICD-10-CM | POA: Insufficient documentation

## 2017-03-08 NOTE — Addendum Note (Signed)
Addended byDamita Dunnings D on: 03/08/2017 02:07 PM   Modules accepted: Orders

## 2017-03-09 ENCOUNTER — Telehealth: Payer: Self-pay | Admitting: Family Medicine

## 2017-03-09 NOTE — Telephone Encounter (Signed)
Spoke w/ Pt, informed of results and recommendations. Pt informed she has received call from [facility] and has appt scheduled.

## 2017-03-09 NOTE — Telephone Encounter (Signed)
Relation to RJ:PVGK Call back number:[phone]   Reason for call:  Patient does not have access to My Chart do to her not having a computer, patient would like a follow up call regarding her CT results, please advise

## 2017-04-09 DIAGNOSIS — N951 Menopausal and female climacteric states: Secondary | ICD-10-CM | POA: Diagnosis not present

## 2017-04-23 DIAGNOSIS — J310 Chronic rhinitis: Secondary | ICD-10-CM | POA: Insufficient documentation

## 2017-04-23 DIAGNOSIS — J302 Other seasonal allergic rhinitis: Secondary | ICD-10-CM | POA: Diagnosis not present

## 2017-04-23 DIAGNOSIS — R51 Headache: Secondary | ICD-10-CM | POA: Diagnosis not present

## 2017-04-25 ENCOUNTER — Ambulatory Visit: Payer: BLUE CROSS/BLUE SHIELD | Admitting: Allergy and Immunology

## 2017-05-09 DIAGNOSIS — M47816 Spondylosis without myelopathy or radiculopathy, lumbar region: Secondary | ICD-10-CM | POA: Diagnosis not present

## 2017-05-25 ENCOUNTER — Other Ambulatory Visit (INDEPENDENT_AMBULATORY_CARE_PROVIDER_SITE_OTHER): Payer: Medicare HMO

## 2017-05-25 DIAGNOSIS — E785 Hyperlipidemia, unspecified: Secondary | ICD-10-CM | POA: Diagnosis not present

## 2017-05-25 LAB — LIPID PANEL
CHOL/HDL RATIO: 6
Cholesterol: 247 mg/dL — ABNORMAL HIGH (ref 0–200)
HDL: 43.5 mg/dL (ref >39.00)
LDL CALC: 181 mg/dL — AB (ref 0–99)
NONHDL: 203.07
TRIGLYCERIDES: 110 mg/dL (ref 0.0–149.0)
VLDL: 22 mg/dL (ref 0.0–40.0)

## 2017-05-25 LAB — COMPREHENSIVE METABOLIC PANEL
ALT: 12 U/L (ref 0–35)
AST: 13 U/L (ref 0–37)
Albumin: 4 g/dL (ref 3.5–5.2)
Alkaline Phosphatase: 59 U/L (ref 39–117)
BILIRUBIN TOTAL: 0.4 mg/dL (ref 0.2–1.2)
BUN: 12 mg/dL (ref 6–23)
CALCIUM: 9.3 mg/dL (ref 8.4–10.5)
CHLORIDE: 105 meq/L (ref 96–112)
CO2: 30 meq/L (ref 19–32)
Creatinine, Ser: 0.76 mg/dL (ref 0.40–1.20)
GFR: 80.21 mL/min (ref >60.00)
Glucose, Bld: 100 mg/dL — ABNORMAL HIGH (ref 70–99)
POTASSIUM: 5 meq/L (ref 3.5–5.1)
Sodium: 140 mEq/L (ref 135–145)
Total Protein: 6.6 g/dL (ref 6.0–8.3)

## 2017-05-28 ENCOUNTER — Other Ambulatory Visit: Payer: Self-pay | Admitting: Family Medicine

## 2017-05-28 DIAGNOSIS — E785 Hyperlipidemia, unspecified: Secondary | ICD-10-CM

## 2017-05-30 ENCOUNTER — Telehealth: Payer: Self-pay

## 2017-05-30 NOTE — Telephone Encounter (Signed)
PA initiated via Covermymeds; KEY: [id number]. Awaiting determination.

## 2017-06-03 IMAGING — CT CT ABD-PELV W/ CM
2 of 5 series · 15 of 46 positions shown, 17 images · IV contrast (APPLIED)
Comparison: CT dated 03/02/2014

CLINICAL DATA: 69-year-old female with abdominal pain and nausea.

EXAM:
CT ABDOMEN AND PELVIS WITH CONTRAST
TECHNIQUE: Multidetector CT imaging of the abdomen and pelvis was performed
using the standard protocol following bolus administration of
intravenous contrast.
CONTRAST:  100mL DPQZGM-OMM IOPAMIDOL (DPQZGM-OMM) INJECTION 61%

[Series 2: axial st · axial · 0.71mm/px · z∈[+745,+1130]mm · 12 of 87 slices shown, 14 images]
[im 5/87  soft-tissue]
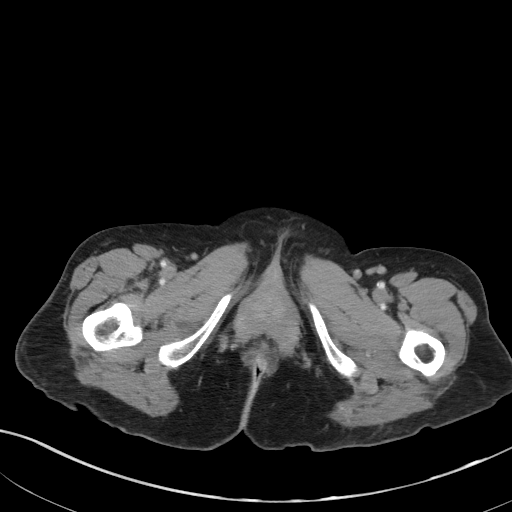
[im 5/87  bone]
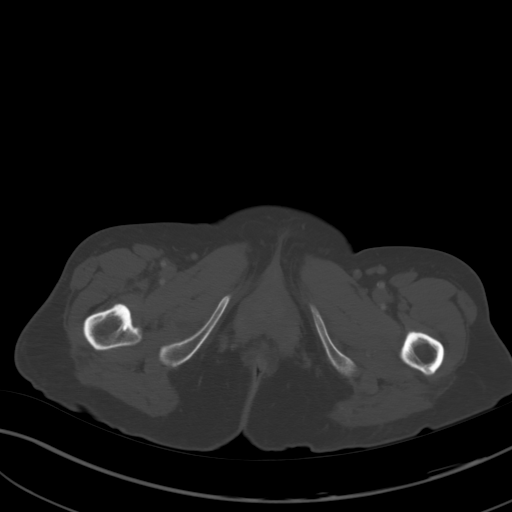
[im 15/87  soft-tissue]
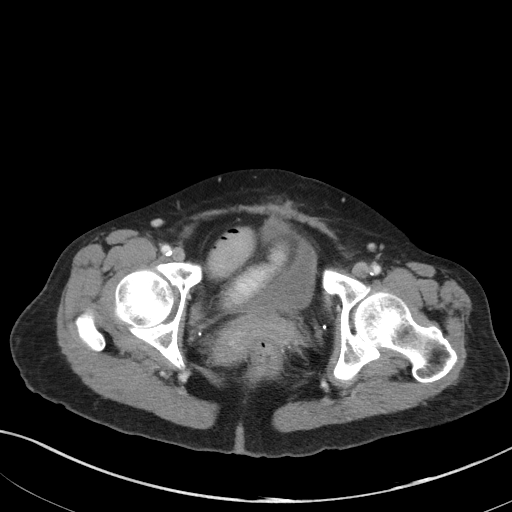
[im 20/87  soft-tissue]
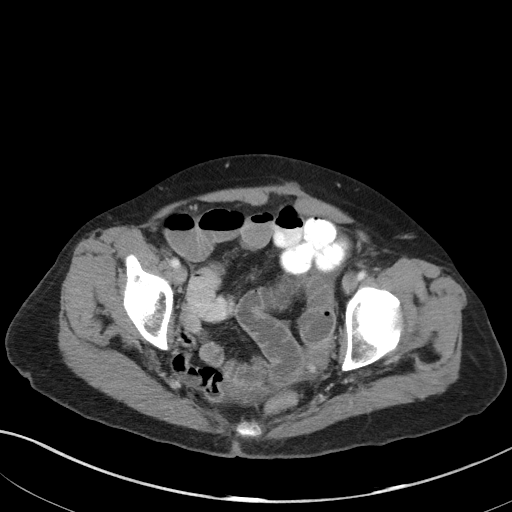
[im 24/87  soft-tissue]
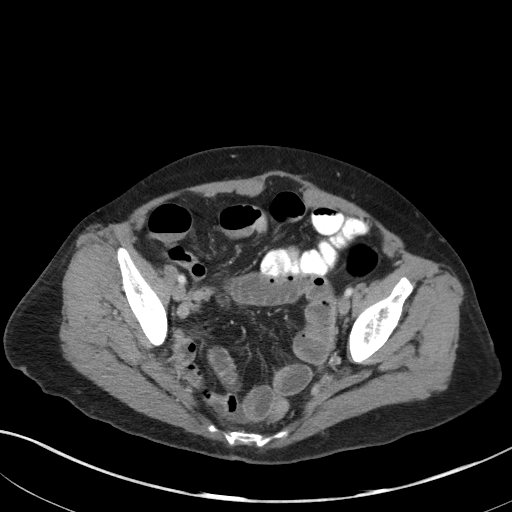
[im 34/87  soft-tissue]
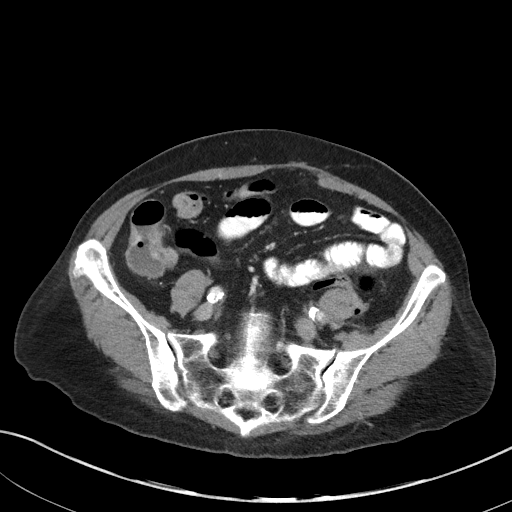
[im 39/87  soft-tissue]
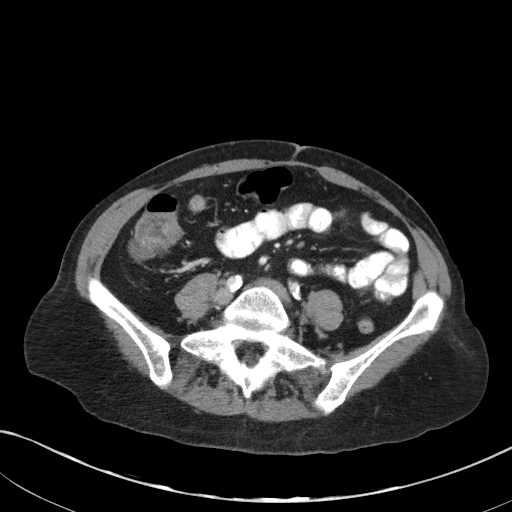
[im 48/87  soft-tissue]
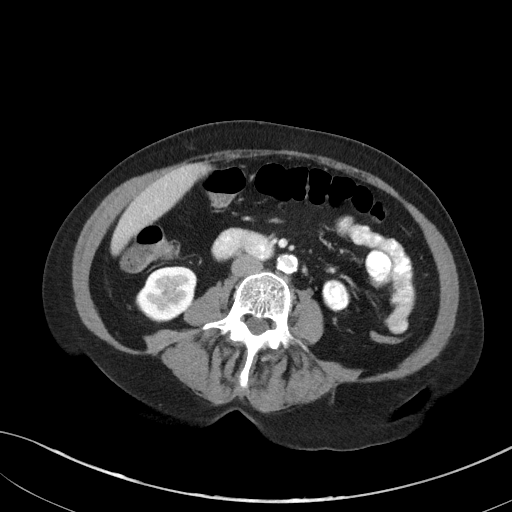
[im 53/87  soft-tissue]
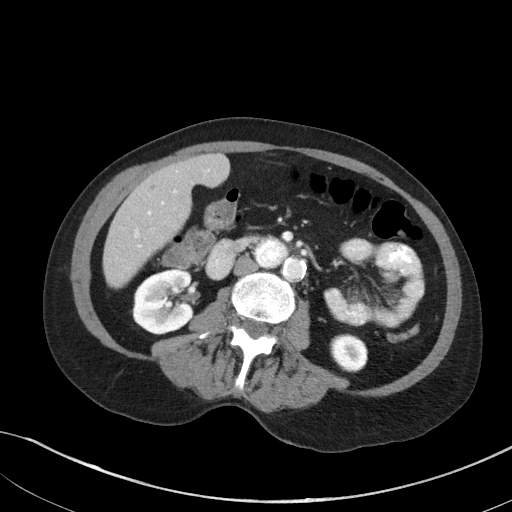
[im 63/87  soft-tissue]
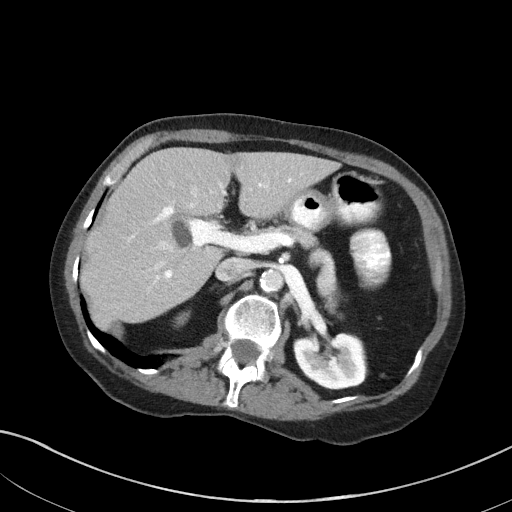
[im 63/87  bone]
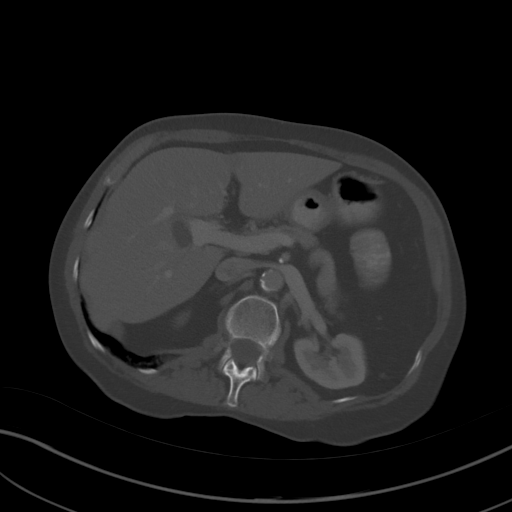
[im 67/87  soft-tissue]
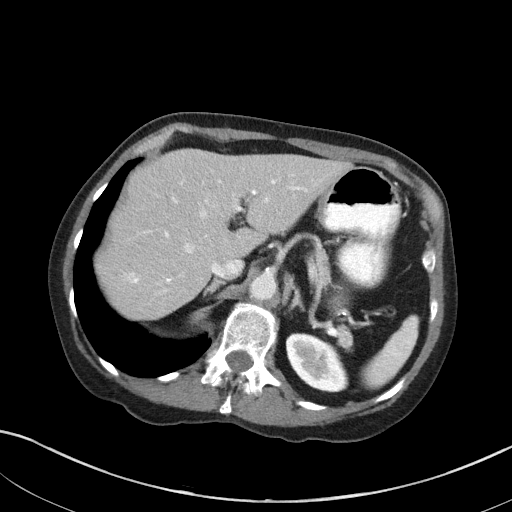
[im 72/87  soft-tissue]
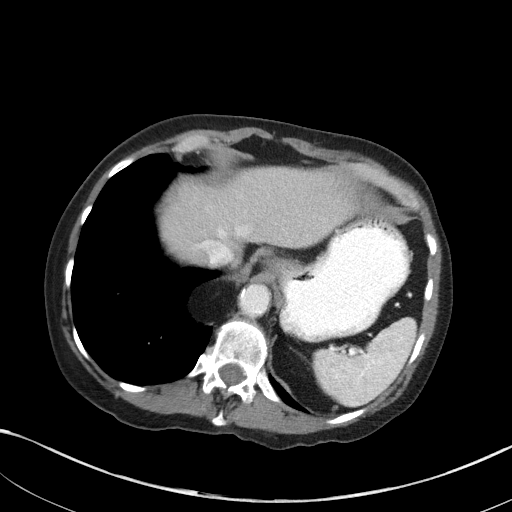
[im 82/87  soft-tissue]
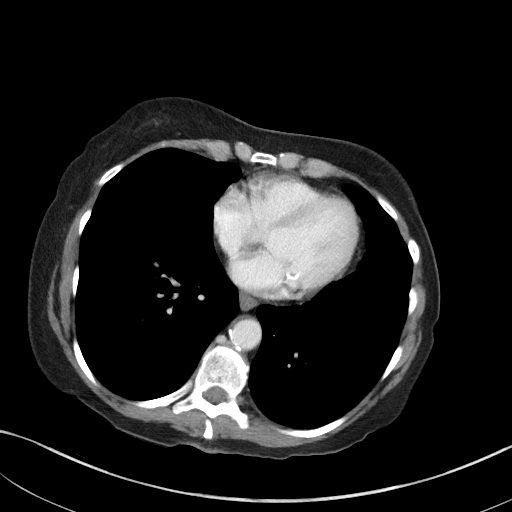

[Series 5: coronal st · coronal · 0.69mm/px · 3 of 100 slices shown]
[im 34/100  soft-tissue]
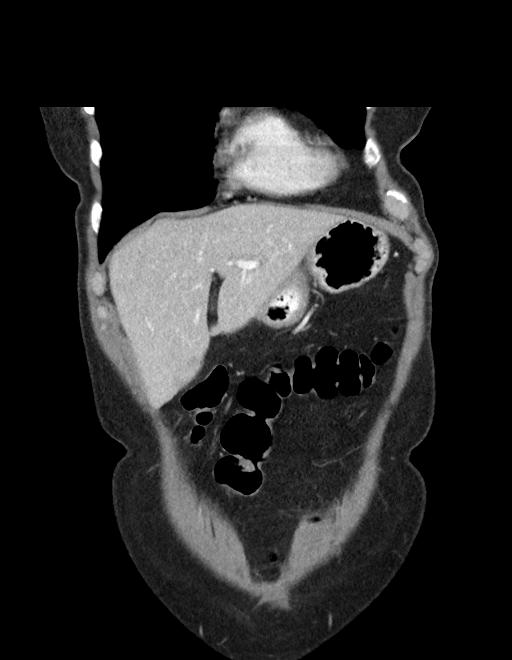
[im 45/100  soft-tissue]
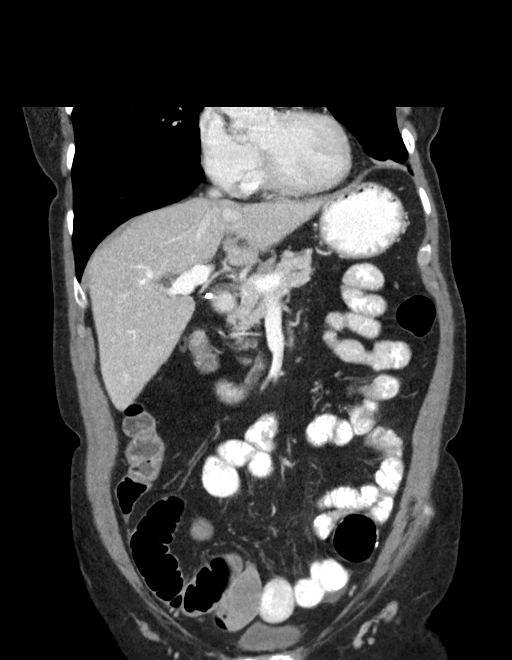
[im 56/100  soft-tissue]
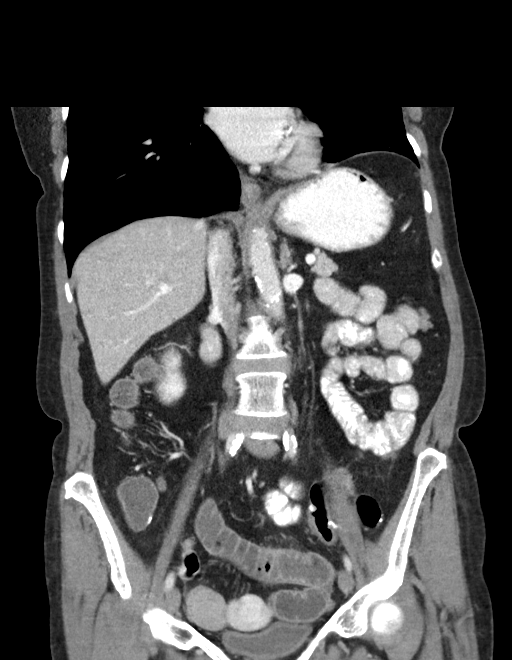

[15 of 46 positions shown; findings below may reference images not displayed]

FINDINGS: Mild emphysematous changes of the right lower lobe. The visualized
lung bases are otherwise clear. There is coronary vascular
calcification. No intra-abdominal free air. Small free fluid within
the pelvis.

Small posterior diaphragmatic defects with herniation of the
mesenteric fat, right greater left compatible with a Bochdalek's
hernia.

Stable appearing 18 mm ovoid hypodense lesion in the right lobe of
the liver centrally, likely a cyst. Scattered smaller hepatic
hypodense lesions are not well characterized but most likely
represent cysts or hemangioma. Cholecystectomy. The pancreas,
spleen, adrenal glands, kidneys, visualized ureters, and urinary
bladder appear unremarkable. Hysterectomy.

Oral contrast opacifies the stomach and multiple loops of small
bowel. Multiple fluid-filled loop of bowel with mild associated
inflammation noted in the pelvis. These loops of bowel measure up to
2.5 cm in diameter. There is a point of transition in the posterior
pelvis (series 2, image 70) concerning for focal area of adhesion
with possible low grade or early small bowel obstruction. The distal
small bowel loops and terminal ileum are collapsed. There is
postsurgical changes of partial sigmoid resection with anastomotic
suture. Appendectomy.

There is advanced aortoiliac atherosclerotic disease. The origins of
the celiac axis, SMA, IMA as well as the origins of the renal
arteries appear patent. No portal venous gas identified. There is no
adenopathy. The abdominal wall soft tissues appear unremarkable.
Midline vertical anterior pelvic wall incisional scar noted. There
is osteopenia with degenerative changes of the spine. No acute
fracture.
IMPRESSION: Findings concerning for a low grade or early distal small bowel
obstruction with transition zone in the posterior pelvis likely
related to underlying adhesion. Clinical correlation and follow-up
recommended.

## 2017-06-04 IMAGING — CR DG ABDOMEN 2V
2 series · 2 of 2 positions shown · non-contrast
Comparison: CT scan of April 08, 2016.

CLINICAL DATA: Acute generalized abdominal pain.

EXAM:
ABDOMEN - 2 VIEW

[abdomen erect]
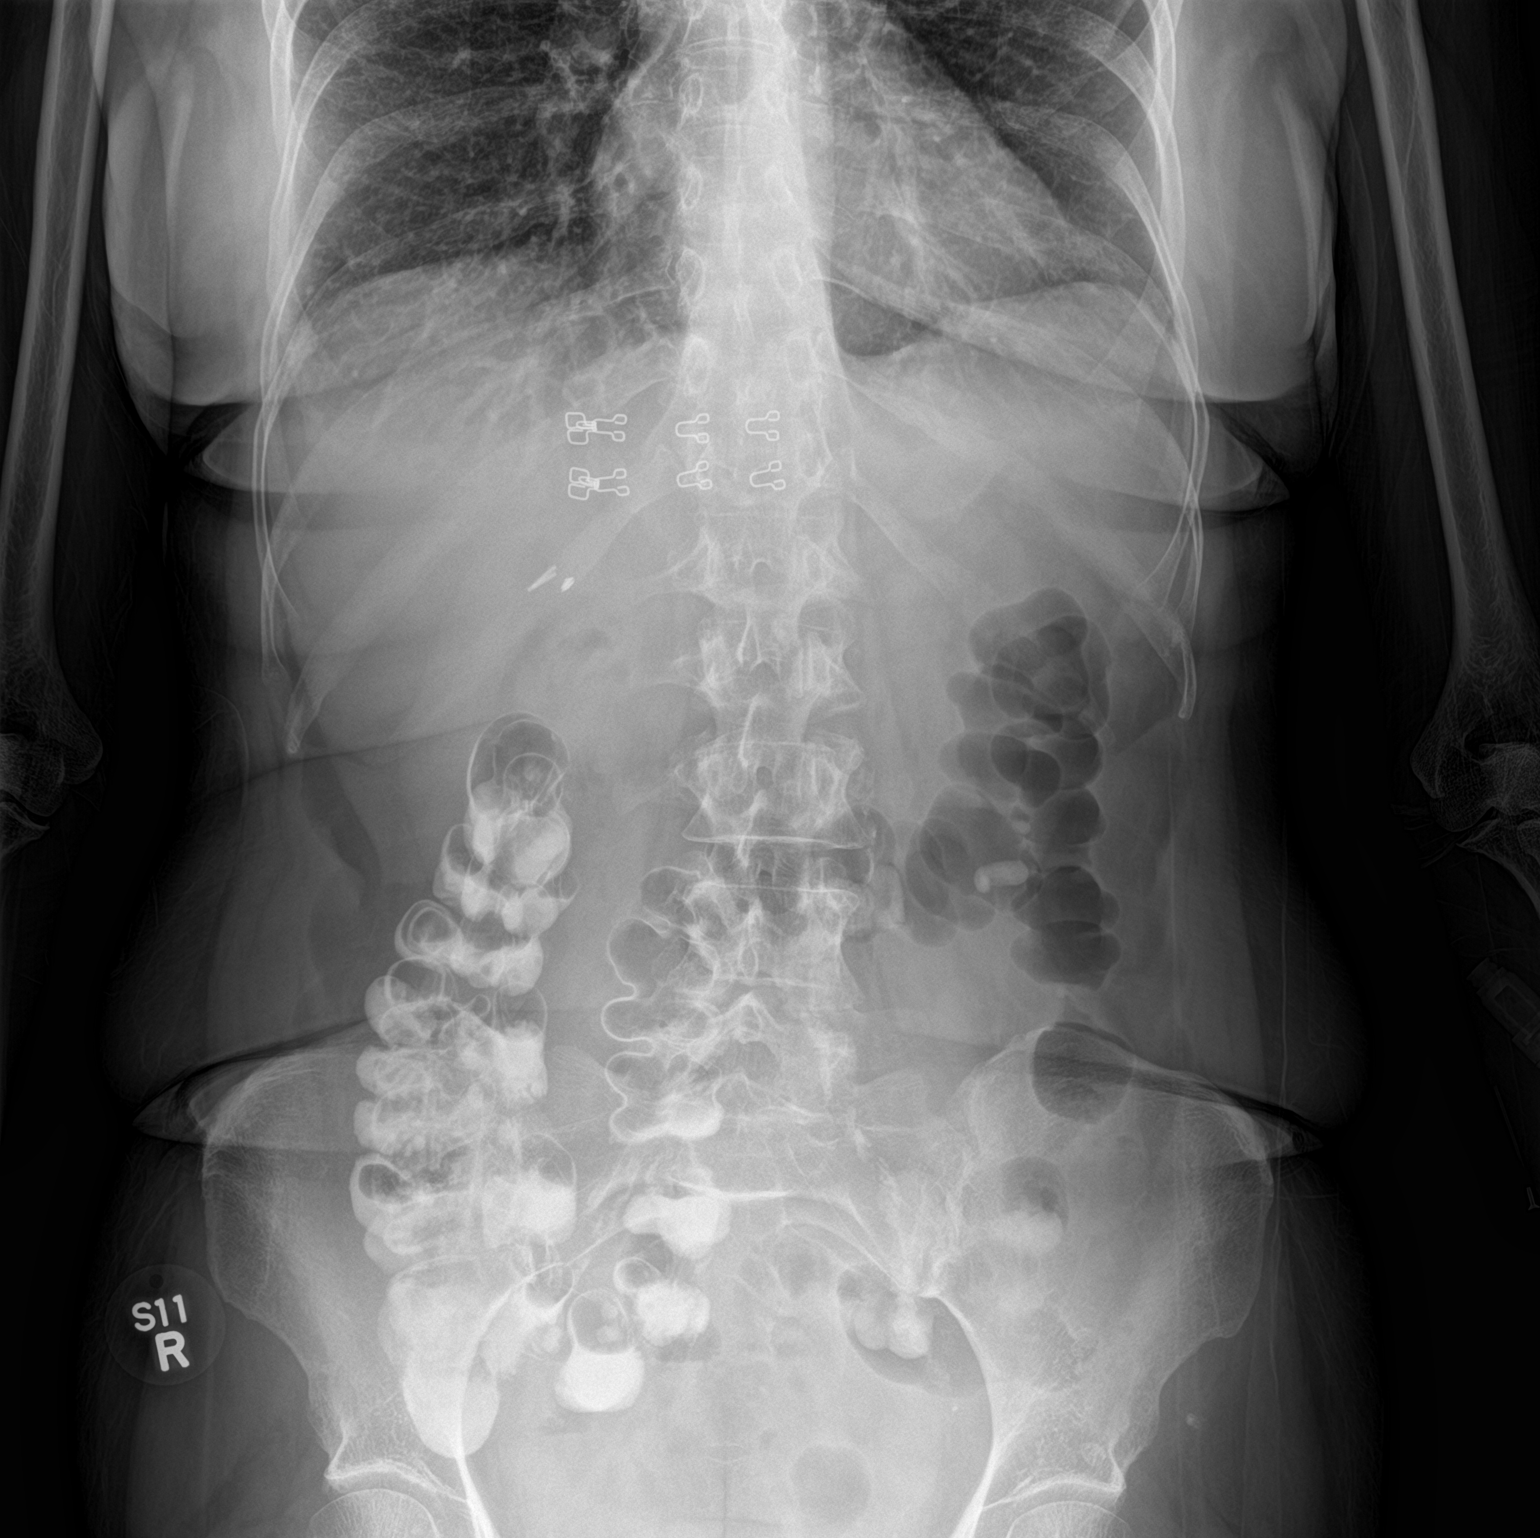

[abdomen supine]
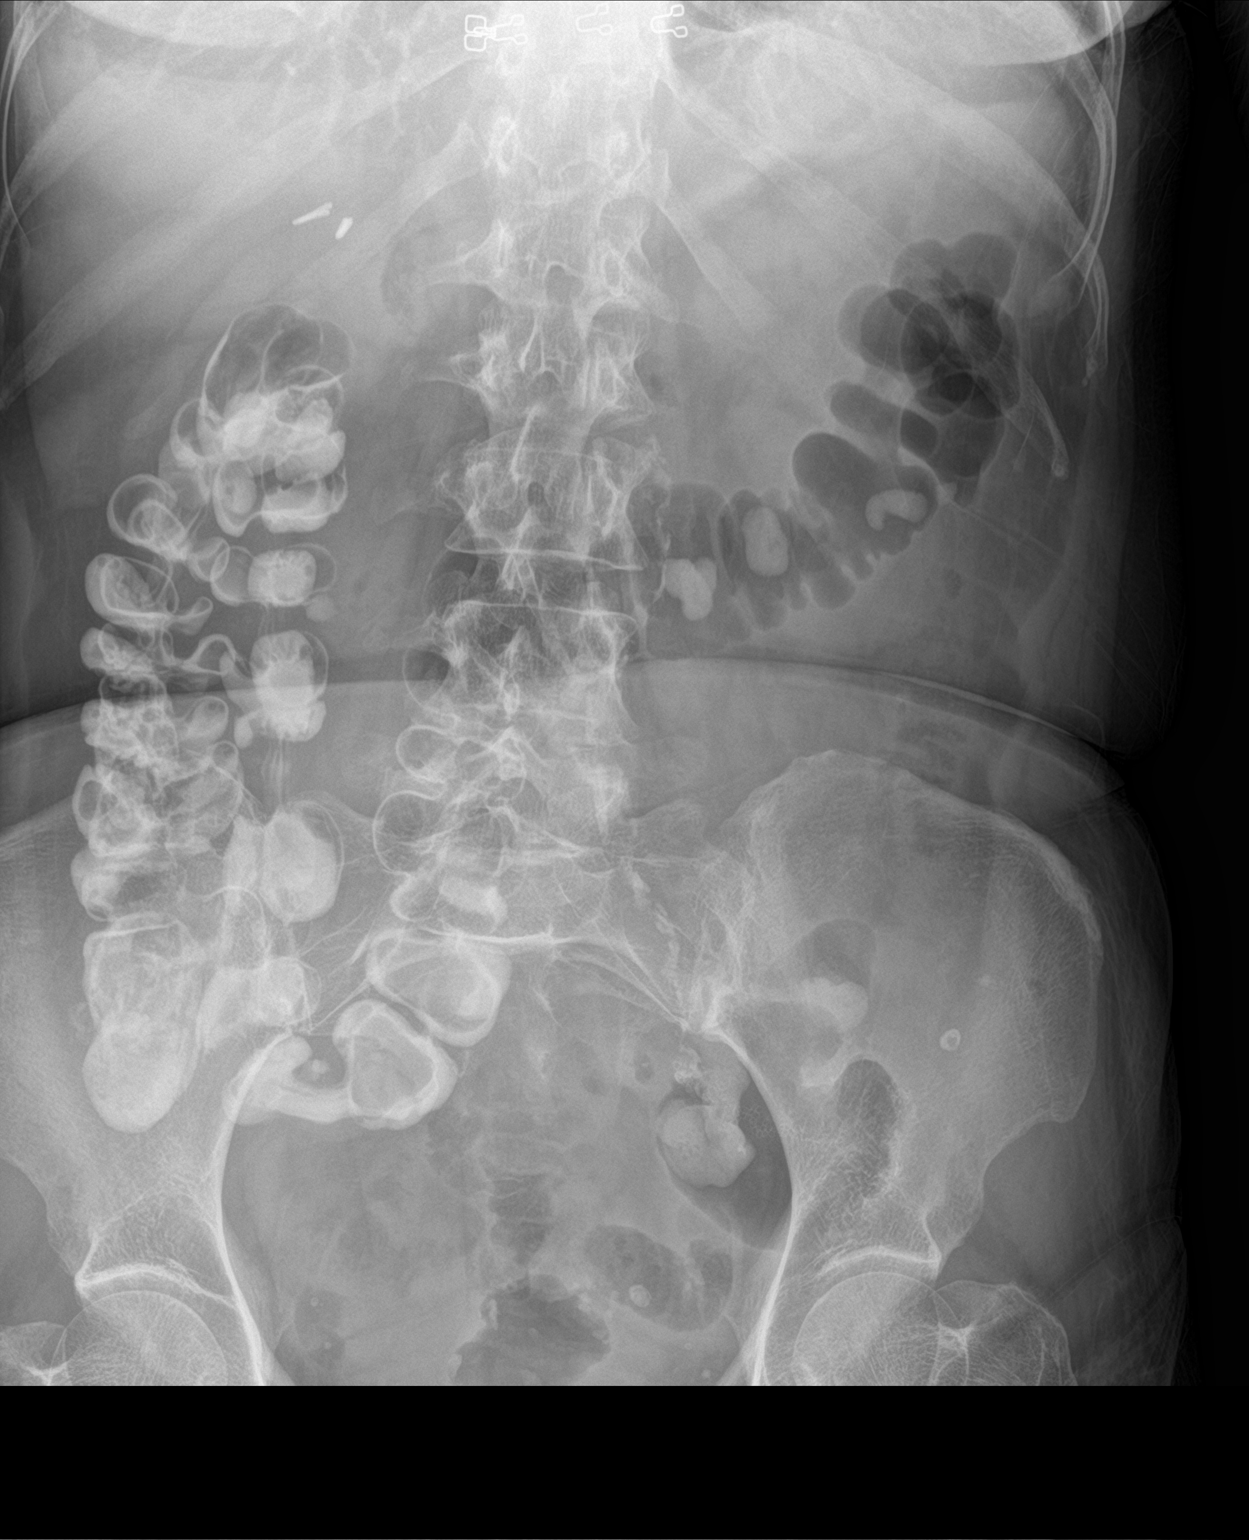

[2 of 2 positions shown; findings below may reference images not displayed]

FINDINGS: No definite dilated bowel is seen currently. No colonic dilatation
is noted. Residual contrast is noted in the colon. Status post
cholecystectomy. Phleboliths are noted in the pelvis.
IMPRESSION: No abnormal bowel dilatation is noted currently. Residual contrast
seen in colon.

## 2017-06-04 NOTE — Telephone Encounter (Signed)
PA Case: [id number], Status: Approved, Coverage Starts on: 06/01/2017 12:00:00 AM, Coverage Ends on: 06/01/2018 12:00:00 AM. Questions? Contact [phone].

## 2017-06-05 DIAGNOSIS — Z716 Tobacco abuse counseling: Secondary | ICD-10-CM | POA: Diagnosis not present

## 2017-06-05 DIAGNOSIS — M47816 Spondylosis without myelopathy or radiculopathy, lumbar region: Secondary | ICD-10-CM | POA: Diagnosis not present

## 2017-06-05 DIAGNOSIS — F17210 Nicotine dependence, cigarettes, uncomplicated: Secondary | ICD-10-CM | POA: Diagnosis not present

## 2017-06-06 ENCOUNTER — Ambulatory Visit (INDEPENDENT_AMBULATORY_CARE_PROVIDER_SITE_OTHER): Payer: Medicare HMO

## 2017-06-06 DIAGNOSIS — Z23 Encounter for immunization: Secondary | ICD-10-CM

## 2017-06-27 DIAGNOSIS — M24812 Other specific joint derangements of left shoulder, not elsewhere classified: Secondary | ICD-10-CM | POA: Diagnosis not present

## 2017-07-05 DIAGNOSIS — Z01419 Encounter for gynecological examination (general) (routine) without abnormal findings: Secondary | ICD-10-CM | POA: Diagnosis not present

## 2017-07-05 DIAGNOSIS — N951 Menopausal and female climacteric states: Secondary | ICD-10-CM | POA: Diagnosis not present

## 2017-07-19 DIAGNOSIS — F419 Anxiety disorder, unspecified: Secondary | ICD-10-CM | POA: Diagnosis not present

## 2017-08-07 DIAGNOSIS — M25552 Pain in left hip: Secondary | ICD-10-CM | POA: Diagnosis not present

## 2017-08-28 ENCOUNTER — Encounter: Payer: Self-pay | Admitting: Family

## 2017-08-28 ENCOUNTER — Other Ambulatory Visit: Payer: Medicare HMO

## 2017-08-28 ENCOUNTER — Ambulatory Visit (INDEPENDENT_AMBULATORY_CARE_PROVIDER_SITE_OTHER): Payer: Medicare HMO | Admitting: Family

## 2017-08-28 VITALS — BP 134/64 | HR 75 | Temp 98.6°F | Resp 16 | Ht 61.0 in | Wt 118.0 lb

## 2017-08-28 DIAGNOSIS — E785 Hyperlipidemia, unspecified: Secondary | ICD-10-CM

## 2017-08-28 DIAGNOSIS — J069 Acute upper respiratory infection, unspecified: Secondary | ICD-10-CM

## 2017-08-28 LAB — COMPREHENSIVE METABOLIC PANEL
ALBUMIN: 4 g/dL (ref 3.5–5.2)
ALT: 12 U/L (ref 0–35)
AST: 11 U/L (ref 0–37)
Alkaline Phosphatase: 69 U/L (ref 39–117)
BILIRUBIN TOTAL: 0.3 mg/dL (ref 0.2–1.2)
BUN: 12 mg/dL (ref 6–23)
CALCIUM: 9.1 mg/dL (ref 8.4–10.5)
CO2: 31 mEq/L (ref 19–32)
Chloride: 105 mEq/L (ref 96–112)
Creatinine, Ser: 0.81 mg/dL (ref 0.40–1.20)
GFR: 74.46 mL/min (ref >60.00)
Glucose, Bld: 111 mg/dL — ABNORMAL HIGH (ref 70–99)
POTASSIUM: 4.8 meq/L (ref 3.5–5.1)
Sodium: 142 mEq/L (ref 135–145)
TOTAL PROTEIN: 6.8 g/dL (ref 6.0–8.3)

## 2017-08-28 LAB — LIPID PANEL
CHOLESTEROL: 189 mg/dL (ref 0–200)
HDL: 40.5 mg/dL (ref >39.00)
LDL Cholesterol: 127 mg/dL — ABNORMAL HIGH (ref 0–99)
NonHDL: 148.65
Total CHOL/HDL Ratio: 5
Triglycerides: 109 mg/dL (ref 0.0–149.0)
VLDL: 21.8 mg/dL (ref 0.0–40.0)

## 2017-08-28 NOTE — Progress Notes (Signed)
Subjective:    Patient ID: Leah [name], female    DOB: [birth year], 72 y.o.   MRN: [id number]  HPI  Patient is a 72 yr old female who presents today with c/o nasal congestion, left ear fullness and sinus pressure. Symptoms started on 08/23/17.  Denies associated fever, cough or respiratory difficulties.  Has used ibuprofen with brief improvement but doesn't like to use too often because "it messes up my stomach."    Review of Systems See HPI  Past Medical History:  Diagnosis Date  . Anxiety   . Barrett esophagus   . Bladder pain   . Chronic bronchitis ([place])    per pt last episode july 2016  . Complication of anesthesia    slow to wake  . Depression   . Diverticulosis   . Frequency of urination   . GERD (gastroesophageal reflux disease)   . Hiatal hernia   . History of adenomatous polyp of colon    tubular adenoma  . History of chronic gastritis   . History of diverticulitis of colon   . History of esophageal dilatation    for stricture  . History of esophageal spasm   . Hyperlipidemia   . Hypertension   . IBS (irritable bowel syndrome)   . Interstitial cystitis   . Productive cough    intermittant  . Smokers' cough ([place])   . Urgency of urination      Social History   Socioeconomic History  . Marital status: Married    Spouse name: Not on file  . Number of children: Not on file  . Years of education: Not on file  . Highest education level: Not on file  Social Needs  . Financial resource strain: Not on file  . Food insecurity - worry: Not on file  . Food insecurity - inability: Not on file  . Transportation needs - medical: Not on file  . Transportation needs - non-medical: Not on file  Occupational History  . Not on file  Tobacco Use  . Smoking status: Current Every Day Smoker    Packs/day: 1.00    Years: 47.00    Pack years: 47.00    Types: Cigarettes  . Smokeless tobacco: Never Used  . Tobacco comment: form given 02/17/16  Substance and Sexual  Activity  . Alcohol use: No    Alcohol/week: 0.0 oz  . Drug use: No  . Sexual activity: Not Currently    Birth control/protection: Post-menopausal  Other Topics Concern  . Not on file  Social History Narrative  . Not on file    Past Surgical History:  Procedure Laterality Date  . ABDOMINAL HYSTERECTOMY  1975  . ANTERIOR CERVICAL DECOMP/DISCECTOMY FUSION N/A 09/07/2014   Procedure: Exploration of Fusion and Removal of Anterior Cervical Hematoma;  Surgeon: Newman Pies, MD;  Location: [facility] NEURO ORS;  Service: Neurosurgery;  Laterality: N/A;  . ANTERIOR FUSION CERVICAL SPINE  09/03/2014   C5 -- C7  . APPENDECTOMY    . CARPAL TUNNEL RELEASE Right 1993  . COLON SURGERY    . COLONOSCOPY  last one 04-30-2013  . CYSTO WITH HYDRODISTENSION N/A 06/08/2015   Procedure: CYSTOSCOPY/HYDRODISTENSION INSTILLATION OF MARCAINE AND PYRIDIUM;  Surgeon: Irine Seal, MD;  Location: [facility];  Service: Urology;  Laterality: N/A;  . CYSTO/  HOD/  BLADDER BX  1990's  . ESOPHAGOGASTRODUODENOSCOPY  last one 09-11-2012  . LAPAROSCOPIC APPENDECTOMY N/A 09/21/2013   Procedure: APPENDECTOMY LAPAROSCOPIC;  Surgeon: Rolm Bookbinder, MD;  Location:  [facility];  Service: General;  Laterality: N/A;  . LAPAROSCOPIC CHOLECYSTECTOMY  2002  . LAPAROSCOPY SIGMOID COLECTOMY  10-05-2008   diverticulitis  . MICROLARYNGOSCOPY  09-27-2006   w/ true vocal cord stripping  and bilateral bx's of lesion's (benign)  . SINUS SURGERY WITH [place]  . [place]  . TONSILLECTOMY AND ADENOIDECTOMY  1964    Family History  Problem Relation Age of Onset  . Pancreatic cancer Mother   . Colon polyps Mother   . Pancreatic cancer Father   . Diabetes Father   . Heart disease Father   . Leukemia Father   . Prostate cancer Father   . Hypertension Father   . Colon cancer Neg Hx     Allergies  Allergen Reactions  . Amoxicillin-Pot Clavulanate Shortness Of Breath, Itching and Swelling   . Avelox [Moxifloxacin Hcl In Nacl] Swelling    Swelling, itching and shortness of breath  . Ciprofloxacin Shortness Of Breath, Itching and Swelling  . Keflex [Cephalexin] Shortness Of Breath and Swelling  . Moxifloxacin Shortness Of Breath, Itching and Swelling  . Penicillins Shortness Of Breath, Itching and Swelling  . Sulfa Antibiotics Shortness Of Breath, Itching and Swelling  . Prednisone Other (See Comments)    GI irritation; is able to tolerate injections  . Acyclovir And Related Other (See Comments)    Pt does not recall this reaction    Current Outpatient Medications on File Prior to Visit  Medication Sig Dispense Refill  . albuterol (PROVENTIL) (2.5 MG/3ML) 0.083% nebulizer solution Inhale 3 mLs (2.5 mg total) into the lungs every 4 (four) hours as needed for wheezing or shortness of breath. q 4-6 hours as needed 75 mL 0  . aspirin EC 81 MG tablet Take 1 tablet (81 mg total) by mouth daily. Over the counter    . atorvastatin (LIPITOR) 80 MG tablet Take 1 tablet (80 mg total) by mouth daily. 90 tablet 1  . azelastine (ASTELIN) 0.1 % nasal spray Place 2 sprays into both nostrils 2 (two) times daily. Use in each nostril as directed 30 mL 3  . cyclobenzaprine (FLEXERIL) 5 MG tablet Take 1 tablet (5 mg total) by mouth 3 (three) times daily. 90 tablet 1  . esomeprazole (NEXIUM) 40 MG capsule Take 1 capsule (40 mg total) by mouth 2 (two) times daily before a meal. 180 capsule 3  . estradiol (CLIMARA - DOSED IN MG/24 HR) 0.1 mg/24hr patch Place 0.1 mg onto the skin every Monday.  0  . ezetimibe (ZETIA) 10 MG tablet Take 1 tablet (10 mg total) by mouth daily. 90 tablet 1  . fluticasone (FLONASE) 50 MCG/ACT nasal spray Place 2 sprays into both nostrils daily. 16 g 6  . Fluticasone-Salmeterol (ADVAIR DISKUS) 250-50 MCG/DOSE AEPB Inhale 1 puff into the lungs 2 (two) times daily. 1 each 5  . hydrOXYzine (ATARAX/VISTARIL) 10 MG tablet 1-2 tab po tid as needed rash or itching 60 tablet 0  .  ibuprofen (ADVIL,MOTRIN) 200 MG tablet Take 400 mg by mouth every 6 (six) hours as needed (pain).    Marland Kitchen lisinopril (PRINIVIL,ZESTRIL) 20 MG tablet Take 1 tablet (20 mg total) by mouth daily. 30 tablet 3  . LORazepam (ATIVAN) 0.5 MG tablet Take 0.5 mg by mouth at bedtime.   5  . polyethylene glycol (MIRALAX / GLYCOLAX) packet Take 17 g by mouth daily as needed for moderate constipation. 14 each 0  . senna-docusate (SENOKOT-S) 8.6-50 MG tablet Take 1 tablet by  mouth at bedtime. 30 tablet 0  . sertraline (ZOLOFT) 50 MG tablet Take 50 mg by mouth at bedtime.    . sodium chloride (OCEAN) 0.65 % SOLN nasal spray Place 1 spray into both nostrils daily as needed for congestion.    Marland Kitchen tiotropium (SPIRIVA HANDIHALER) 18 MCG inhalation capsule Place 1 capsule (18 mcg total) into inhaler and inhale daily. 90 capsule 1  . tiZANidine (ZANAFLEX) 4 MG capsule Take 1 capsule (4 mg total) by mouth 3 (three) times daily as needed for muscle spasms. 30 capsule 0   No current facility-administered medications on file prior to visit.     BP 134/64 (BP Location: Left Arm, Patient Position: Sitting, Cuff Size: Small)   Pulse 75   Temp 98.6 F (37 C) (Oral)   Resp 16   Ht 5\' 1"  (1.549 m)   Wt 118 lb (53.5 kg)   SpO2 98%   BMI 22.30 kg/m       Objective:   Physical Exam  Constitutional: She is oriented to person, place, and time. She appears well-developed and well-nourished.  HENT:  Head: Normocephalic and atraumatic.  Right Ear: Tympanic membrane and ear canal normal.  Left Ear: Ear canal normal.  Clear effusion left TM without bulging or erythema  Cardiovascular: Normal rate, regular rhythm and normal heart sounds.  No murmur heard. Pulmonary/Chest: Effort normal and breath sounds normal. No respiratory distress. She has no wheezes.  Musculoskeletal: She exhibits no edema.  Neurological: She is alert and oriented to person, place, and time.  Skin: Skin is warm and dry.  Psychiatric: She has a normal  mood and affect. Her behavior is normal. Judgment and thought content normal.          Assessment & Plan:  Viral URI- advised pt as follows:  Add mucinex 600mg  twice daily for nasal congestion. You may also use nasal saline spray twice daily. (initially recommended flonase but she declined). You may use tylenol as needed for pain/headache. Call if new/worsening symptoms or if symptoms are not improved in 3-4 days.

## 2017-08-28 NOTE — Patient Instructions (Addendum)
Add mucinex 600mg  twice daily for nasal congestion. You may also use nasal saline spray twice daily. You may use tylenol as needed for pain/headache. Call if new/worsening symptoms or if symptoms are not improved in 3-4 days.   Upper Respiratory Infection, Adult Most upper respiratory infections (URIs) are caused by a virus. A URI affects the nose, throat, and upper air passages. The most common type of URI is often called "the common cold." Follow these instructions at home:  Take medicines only as told by your doctor.  Gargle warm saltwater or take cough drops to comfort your throat as told by your doctor.  Use a warm mist humidifier or inhale steam from a shower to increase air moisture. This may make it easier to breathe.  Drink enough fluid to keep your pee (urine) clear or pale yellow.  Eat soups and other clear broths.  Have a healthy diet.  Rest as needed.  Go back to work when your fever is gone or your doctor says it is okay. ? You may need to stay home longer to avoid giving your URI to others. ? You can also wear a face mask and wash your hands often to prevent spread of the virus.  Use your inhaler more if you have asthma.  Do not use any tobacco products, including cigarettes, chewing tobacco, or electronic cigarettes. If you need help quitting, ask your doctor. Contact a doctor if:  You are getting worse, not better.  Your symptoms are not helped by medicine.  You have chills.  You are getting more short of breath.  You have brown or red mucus.  You have yellow or brown discharge from your nose.  You have pain in your face, especially when you bend forward.  You have a fever.  You have puffy (swollen) neck glands.  You have pain while swallowing.  You have white areas in the back of your throat. Get help right away if:  You have very bad or constant: ? Headache. ? Ear pain. ? Pain in your forehead, behind your eyes, and over your cheekbones  (sinus pain). ? Chest pain.  You have long-lasting (chronic) lung disease and any of the following: ? Wheezing. ? Long-lasting cough. ? Coughing up blood. ? A change in your usual mucus.  You have a stiff neck.  You have changes in your: ? Vision. ? Hearing. ? Thinking. ? Mood. This information is not intended to replace advice given to you by your health care provider. Make sure you discuss any questions you have with your health care provider. Document Released: 01/03/2008 Document Revised: 03/19/2016 Document Reviewed: 10/22/2013 Elsevier Interactive Patient Education  2018 [employer].

## 2017-08-31 ENCOUNTER — Encounter: Payer: Self-pay | Admitting: Family Medicine

## 2017-08-31 ENCOUNTER — Ambulatory Visit (INDEPENDENT_AMBULATORY_CARE_PROVIDER_SITE_OTHER): Payer: Medicare HMO | Admitting: Family Medicine

## 2017-08-31 VITALS — BP 100/62 | HR 71 | Temp 98.2°F | Resp 16 | Ht 61.0 in | Wt 118.6 lb

## 2017-08-31 DIAGNOSIS — J324 Chronic pansinusitis: Secondary | ICD-10-CM

## 2017-08-31 DIAGNOSIS — E785 Hyperlipidemia, unspecified: Secondary | ICD-10-CM | POA: Diagnosis not present

## 2017-08-31 MED ORDER — IPRATROPIUM BROMIDE 0.03 % NA SOLN: 2.0000 | Freq: Two times a day (BID) | NASAL | Status: DC

## 2017-08-31 MED ORDER — TRIAMCINOLONE ACETONIDE 55 MCG/ACT NA AERO: 2.0000 | INHALATION_SPRAY | Freq: Every day | NASAL | 12 refills | Status: DC

## 2017-08-31 MED ORDER — DOXYCYCLINE HYCLATE 100 MG PO TABS
100.0000 mg | ORAL_TABLET | Freq: Two times a day (BID) | ORAL | 0 refills | Status: DC
Start: 2017-08-31 — End: 2017-09-20

## 2017-08-31 NOTE — Progress Notes (Signed)
Patient ID: Leah [name], female   DOB: 02/18/1946, 72 y.o.   MRN: [id number]    Subjective:  I acted as a Education administrator for Dr. Carollee Herter.  Guerry Bruin, [place]   Patient ID: Leah [name], female    DOB: 04/25/1946, 72 y.o.   MRN: [id number]  Chief Complaint  Patient presents with  . Sinusitis    Sinusitis  This is a new problem. Episode onset: last thursday or friday. Associated symptoms include chills, congestion (in sinuses and head), coughing, ear pain (left ear), headaches and sinus pressure. Pertinent negatives include no hoarse voice, neck pain, shortness of breath, sneezing or sore throat. Past treatments include saline sprays (mucinex). The treatment provided mild relief.    Patient is in today for possible sinus infection.   Patient Care Team: Carollee Herter, Alferd Apa, DO as PCP - General   Past Medical History:  Diagnosis Date  . Anxiety   . Barrett esophagus   . Bladder pain   . Chronic bronchitis ([place])    per pt last episode july 2016  . Complication of anesthesia    slow to wake  . Depression   . Diverticulosis   . Frequency of urination   . GERD (gastroesophageal reflux disease)   . Hiatal hernia   . History of adenomatous polyp of colon    tubular adenoma  . History of chronic gastritis   . History of diverticulitis of colon   . History of esophageal dilatation    for stricture  . History of esophageal spasm   . Hyperlipidemia   . Hypertension   . IBS (irritable bowel syndrome)   . Interstitial cystitis   . Productive cough    intermittant  . Smokers' cough ([place])   . Urgency of urination     Past Surgical History:  Procedure Laterality Date  . ABDOMINAL HYSTERECTOMY  1975  . ANTERIOR CERVICAL DECOMP/DISCECTOMY FUSION N/A 09/07/2014   Procedure: Exploration of Fusion and Removal of Anterior Cervical Hematoma;  Surgeon: Newman Pies, MD;  Location: [facility] NEURO ORS;  Service: Neurosurgery;  Laterality: N/A;  . ANTERIOR FUSION CERVICAL SPINE  09/03/2014     C5 -- C7  . APPENDECTOMY    . CARPAL TUNNEL RELEASE Right 1993  . COLON SURGERY    . COLONOSCOPY  last one 04-30-2013  . CYSTO WITH HYDRODISTENSION N/A 06/08/2015   Procedure: CYSTOSCOPY/HYDRODISTENSION INSTILLATION OF MARCAINE AND PYRIDIUM;  Surgeon: Irine Seal, MD;  Location: [facility];  Service: Urology;  Laterality: N/A;  . CYSTO/  HOD/  BLADDER BX  1990's  . ESOPHAGOGASTRODUODENOSCOPY  last one 09-11-2012  . LAPAROSCOPIC APPENDECTOMY N/A 09/21/2013   Procedure: APPENDECTOMY LAPAROSCOPIC;  Surgeon: Rolm Bookbinder, MD;  Location: [facility];  Service: General;  Laterality: N/A;  . LAPAROSCOPIC CHOLECYSTECTOMY  2002  . LAPAROSCOPY SIGMOID COLECTOMY  10-05-2008   diverticulitis  . MICROLARYNGOSCOPY  09-27-2006   w/ true vocal cord stripping  and bilateral bx's of lesion's (benign)  . SINUS SURGERY WITH [place]  . [place]  . TONSILLECTOMY AND ADENOIDECTOMY  1964    Family History  Problem Relation Age of Onset  . Pancreatic cancer Mother   . Colon polyps Mother   . Pancreatic cancer Father   . Diabetes Father   . Heart disease Father   . Leukemia Father   . Prostate cancer Father   . Hypertension Father   . Colon cancer Neg Hx     Social History  Socioeconomic History  . Marital status: Married    Spouse name: Not on file  . Number of children: Not on file  . Years of education: Not on file  . Highest education level: Not on file  Social Needs  . Financial resource strain: Not on file  . Food insecurity - worry: Not on file  . Food insecurity - inability: Not on file  . Transportation needs - medical: Not on file  . Transportation needs - non-medical: Not on file  Occupational History  . Not on file  Tobacco Use  . Smoking status: Current Every Day Smoker    Packs/day: 1.00    Years: 47.00    Pack years: 47.00    Types: Cigarettes  . Smokeless tobacco: Never Used  . Tobacco comment: form given 02/17/16   Substance and Sexual Activity  . Alcohol use: No    Alcohol/week: 0.0 oz  . Drug use: No  . Sexual activity: Not Currently    Birth control/protection: Post-menopausal  Other Topics Concern  . Not on file  Social History Narrative  . Not on file    Outpatient Medications Prior to Visit  Medication Sig Dispense Refill  . albuterol (PROVENTIL) (2.5 MG/3ML) 0.083% nebulizer solution Inhale 3 mLs (2.5 mg total) into the lungs every 4 (four) hours as needed for wheezing or shortness of breath. q 4-6 hours as needed 75 mL 0  . aspirin EC 81 MG tablet Take 1 tablet (81 mg total) by mouth daily. Over the counter    . atorvastatin (LIPITOR) 80 MG tablet Take 1 tablet (80 mg total) by mouth daily. 90 tablet 1  . azelastine (ASTELIN) 0.1 % nasal spray Place 2 sprays into both nostrils 2 (two) times daily. Use in each nostril as directed 30 mL 3  . cyclobenzaprine (FLEXERIL) 5 MG tablet Take 1 tablet (5 mg total) by mouth 3 (three) times daily. 90 tablet 1  . esomeprazole (NEXIUM) 40 MG capsule Take 1 capsule (40 mg total) by mouth 2 (two) times daily before a meal. 180 capsule 3  . estradiol (CLIMARA - DOSED IN MG/24 HR) 0.1 mg/24hr patch Place 0.1 mg onto the skin every Monday.  0  . ezetimibe (ZETIA) 10 MG tablet Take 1 tablet (10 mg total) by mouth daily. 90 tablet 1  . fluticasone (FLONASE) 50 MCG/ACT nasal spray Place 2 sprays into both nostrils daily. 16 g 6  . Fluticasone-Salmeterol (ADVAIR DISKUS) 250-50 MCG/DOSE AEPB Inhale 1 puff into the lungs 2 (two) times daily. 1 each 5  . hydrOXYzine (ATARAX/VISTARIL) 10 MG tablet 1-2 tab po tid as needed rash or itching 60 tablet 0  . ibuprofen (ADVIL,MOTRIN) 200 MG tablet Take 400 mg by mouth every 6 (six) hours as needed (pain).    Marland Kitchen lisinopril (PRINIVIL,ZESTRIL) 20 MG tablet Take 1 tablet (20 mg total) by mouth daily. 30 tablet 3  . LORazepam (ATIVAN) 0.5 MG tablet Take 0.5 mg by mouth at bedtime.   5  . polyethylene glycol (MIRALAX / GLYCOLAX)  packet Take 17 g by mouth daily as needed for moderate constipation. 14 each 0  . senna-docusate (SENOKOT-S) 8.6-50 MG tablet Take 1 tablet by mouth at bedtime. 30 tablet 0  . sertraline (ZOLOFT) 50 MG tablet Take 50 mg by mouth at bedtime.    . sodium chloride (OCEAN) 0.65 % SOLN nasal spray Place 1 spray into both nostrils daily as needed for congestion.    Marland Kitchen tiotropium (SPIRIVA HANDIHALER) 18 MCG inhalation capsule  Place 1 capsule (18 mcg total) into inhaler and inhale daily. 90 capsule 1  . tiZANidine (ZANAFLEX) 4 MG capsule Take 1 capsule (4 mg total) by mouth 3 (three) times daily as needed for muscle spasms. 30 capsule 0   No facility-administered medications prior to visit.     Allergies  Allergen Reactions  . Amoxicillin-Pot Clavulanate Shortness Of Breath, Itching and Swelling  . Avelox [Moxifloxacin Hcl In Nacl] Swelling    Swelling, itching and shortness of breath  . Ciprofloxacin Shortness Of Breath, Itching and Swelling  . Keflex [Cephalexin] Shortness Of Breath and Swelling  . Moxifloxacin Shortness Of Breath, Itching and Swelling  . Penicillins Shortness Of Breath, Itching and Swelling  . Sulfa Antibiotics Shortness Of Breath, Itching and Swelling  . Prednisone Other (See Comments)    GI irritation; is able to tolerate injections  . Acyclovir And Related Other (See Comments)    Pt does not recall this reaction    Review of Systems  Constitutional: Positive for chills.  HENT: Positive for congestion (in sinuses and head), ear pain (left ear), sinus pressure and sinus pain. Negative for hoarse voice, sneezing and sore throat.   Respiratory: Positive for cough. Negative for shortness of breath.   Musculoskeletal: Negative for neck pain.  Neurological: Positive for headaches.       Objective:    Physical Exam  Constitutional: She is oriented to person, place, and time. She appears well-developed and well-nourished.  HENT:  Right Ear: Tympanic membrane, external ear  and ear canal normal.  Left Ear: Tympanic membrane, external ear and ear canal normal.  Nose: Right sinus exhibits maxillary sinus tenderness and frontal sinus tenderness. Left sinus exhibits maxillary sinus tenderness and frontal sinus tenderness.  + PND + errythema  Eyes: Conjunctivae are normal. Right eye exhibits no discharge. Left eye exhibits no discharge.  Cardiovascular: Normal rate, regular rhythm and normal heart sounds.  No murmur heard. Pulmonary/Chest: Effort normal and breath sounds normal. No respiratory distress. She has no wheezes. She has no rales. She exhibits no tenderness.  Musculoskeletal: She exhibits no edema.  Lymphadenopathy:    She has cervical adenopathy.  Neurological: She is alert and oriented to person, place, and time.  Nursing note and vitals reviewed.   BP 100/62 (BP Location: Left Arm, Cuff Size: Normal)   Pulse 71   Temp 98.2 F (36.8 C) (Oral)   Resp 16   Ht 5\' 1"  (1.549 m)   Wt 118 lb 9.6 oz (53.8 kg)   SpO2 94%   BMI 22.41 kg/m  Wt Readings from Last 3 Encounters:  08/31/17 118 lb 9.6 oz (53.8 kg)  08/28/17 118 lb (53.5 kg)  03/06/17 106 lb 8 oz (48.3 kg)   BP Readings from Last 3 Encounters:  08/31/17 100/62  08/28/17 134/64  03/06/17 126/66     Immunization History  Administered Date(s) Administered  . Influenza Split 04/30/2013  . Influenza Whole 12/11/2007, 04/30/2012  . Influenza, High Dose Seasonal PF 07/10/2016, 06/06/2017  . Influenza,inj,Quad PF,6+ Mos 06/08/2014, 04/27/2015  . Pneumococcal Conjugate-13 06/20/2013  . Pneumococcal Polysaccharide-23 12/11/2007  . Td 02/22/2009    Health Maintenance  Topic Date Due  . PNA vac Low Risk Adult (2 of 2 - PPSV23) 06/20/2014  . COLONOSCOPY  04/30/2018  . MAMMOGRAM  08/01/2018  . TETANUS/TDAP  02/23/2019  . INFLUENZA VACCINE  Completed  . DEXA SCAN  Completed  . Hepatitis C Screening  Completed    Lab Results  Component Value Date  WBC 5.4 07/12/2016   HGB 13.8  07/12/2016   HCT 41.4 07/12/2016   PLT 215.0 07/12/2016   GLUCOSE 111 (H) 08/28/2017   CHOL 189 08/28/2017   TRIG 109.0 08/28/2017   HDL 40.50 08/28/2017   LDLDIRECT 151.0 11/07/2016   LDLCALC 127 (H) 08/28/2017   ALT 12 08/28/2017   AST 11 08/28/2017   NA 142 08/28/2017   K 4.8 08/28/2017   CL 105 08/28/2017   CREATININE 0.81 08/28/2017   BUN 12 08/28/2017   CO2 31 08/28/2017   TSH 1.92 03/30/2016   INR 1.0 05/21/2009   HGBA1C 6.1 09/19/2011   MICROALBUR <0.7 11/09/2014    Lab Results  Component Value Date   TSH 1.92 03/30/2016   Lab Results  Component Value Date   WBC 5.4 07/12/2016   HGB 13.8 07/12/2016   HCT 41.4 07/12/2016   MCV 88.7 07/12/2016   PLT 215.0 07/12/2016   Lab Results  Component Value Date   NA 142 08/28/2017   K 4.8 08/28/2017   CO2 31 08/28/2017   GLUCOSE 111 (H) 08/28/2017   BUN 12 08/28/2017   CREATININE 0.81 08/28/2017   BILITOT 0.3 08/28/2017   ALKPHOS 69 08/28/2017   AST 11 08/28/2017   ALT 12 08/28/2017   PROT 6.8 08/28/2017   ALBUMIN 4.0 08/28/2017   CALCIUM 9.1 08/28/2017   ANIONGAP 6 04/12/2016   GFR 74.46 08/28/2017   Lab Results  Component Value Date   CHOL 189 08/28/2017   Lab Results  Component Value Date   HDL 40.50 08/28/2017   Lab Results  Component Value Date   LDLCALC 127 (H) 08/28/2017   Lab Results  Component Value Date   TRIG 109.0 08/28/2017   Lab Results  Component Value Date   CHOLHDL 5 08/28/2017   Lab Results  Component Value Date   HGBA1C 6.1 09/19/2011         Assessment & Plan:   Problem List Items Addressed This Visit      Unprioritized   Hyperlipidemia LDL goal <100    Tolerating statin, encouraged heart healthy diet, avoid trans fats, minimize simple carbs and saturated fats. Increase exercise as tolerated       Other Visit Diagnoses    Pansinusitis, unspecified chronicity    -  Primary   Relevant Medications   doxycycline (VIBRA-TABS) 100 MG tablet   triamcinolone  (NASACORT) 55 MCG/ACT AERO nasal inhaler      I am having [name]. [place] start on doxycycline and triamcinolone. I am also having her maintain her sertraline, LORazepam, fluticasone, estradiol, sodium chloride, ibuprofen, cyclobenzaprine, polyethylene glycol, senna-docusate, aspirin EC, ezetimibe, lisinopril, azelastine, tiZANidine, albuterol, atorvastatin, tiotropium, esomeprazole, hydrOXYzine, and Fluticasone-Salmeterol.  Meds ordered this encounter  Medications  . doxycycline (VIBRA-TABS) 100 MG tablet    Sig: Take 1 tablet (100 mg total) by mouth 2 (two) times daily.    Dispense:  20 tablet    Refill:  0  . DISCONTD: ipratropium (ATROVENT) 0.03 % nasal spray 2 spray  . triamcinolone (NASACORT) 55 MCG/ACT AERO nasal inhaler    Sig: Place 2 sprays into the nose daily.    Dispense:  1 Inhaler    Refill:  12     CMA served as scribe during this visit. History, Physical and Plan performed by medical provider. Documentation and orders reviewed and attested to.  [name], DO

## 2017-08-31 NOTE — Patient Instructions (Addendum)
Sinusitis, Adult Sinusitis is soreness and inflammation of your sinuses. Sinuses are hollow spaces in the bones around your face. Your sinuses are located:  Around your eyes.  In the middle of your forehead.  Behind your nose.  In your cheekbones.  Your sinuses and nasal passages are lined with a stringy fluid (mucus). Mucus normally drains out of your sinuses. When your nasal tissues become inflamed or swollen, the mucus can become trapped or blocked so air cannot flow through your sinuses. This allows bacteria, viruses, and funguses to grow, which leads to infection. Sinusitis can develop quickly and last for 7?10 days (acute) or for more than 12 weeks (chronic). Sinusitis often develops after a cold. What are the causes? This condition is caused by anything that creates swelling in the sinuses or stops mucus from draining, including:  Allergies.  Asthma.  Bacterial or viral infection.  Abnormally shaped bones between the nasal passages.  Nasal growths that contain mucus (nasal polyps).  Narrow sinus openings.  Pollutants, such as chemicals or irritants in the air.  A foreign object stuck in the nose.  A fungal infection. This is rare.  What increases the risk? The following factors may make you more likely to develop this condition:  Having allergies or asthma.  Having had a recent cold or respiratory tract infection.  Having structural deformities or blockages in your nose or sinuses.  Having a weak immune system.  Doing a lot of swimming or diving.  Overusing nasal sprays.  Smoking.  What are the signs or symptoms? The main symptoms of this condition are pain and a feeling of pressure around the affected sinuses. Other symptoms include:  Upper toothache.  Earache.  Headache.  Bad breath.  Decreased sense of smell and taste.  A cough that may get worse at night.  Fatigue.  Fever.  Thick drainage from your nose. The drainage is often green and  it may contain pus (purulent).  Stuffy nose or congestion.  Postnasal drip. This is when extra mucus collects in the throat or back of the nose.  Swelling and warmth over the affected sinuses.  Sore throat.  Sensitivity to light.  How is this diagnosed? This condition is diagnosed based on symptoms, a medical history, and a physical exam. To find out if your condition is acute or chronic, your health care provider may:  Look in your nose for signs of nasal polyps.  Tap over the affected sinus to check for signs of infection.  View the inside of your sinuses using an imaging device that has a light attached (endoscope).  If your health care provider suspects that you have chronic sinusitis, you may also:  Be tested for allergies.  Have a sample of mucus taken from your nose (nasal culture) and checked for bacteria.  Have a mucus sample examined to see if your sinusitis is related to an allergy.  If your sinusitis does not respond to treatment and it lasts longer than 8 weeks, you may have an MRI or CT scan to check your sinuses. These scans also help to determine how severe your infection is. In rare cases, a bone biopsy may be done to rule out more serious types of fungal sinus disease. How is this treated? Treatment for sinusitis depends on the cause and whether your condition is chronic or acute. If a virus is causing your sinusitis, your symptoms will go away on their own within 10 days. You may be given medicines to relieve your symptoms,   including:  Topical nasal decongestants. They shrink swollen nasal passages and let mucus drain from your sinuses.  Antihistamines. These drugs block inflammation that is triggered by allergies. This can help to ease swelling in your nose and sinuses.  Topical nasal corticosteroids. These are nasal sprays that ease inflammation and swelling in your nose and sinuses.  Nasal saline washes. These rinses can help to get rid of thick mucus in  your nose.  If your condition is caused by bacteria, you will be given an antibiotic medicine. If your condition is caused by a fungus, you will be given an antifungal medicine. Surgery may be needed to correct underlying conditions, such as narrow nasal passages. Surgery may also be needed to remove polyps. Follow these instructions at home: Medicines  Take, use, or apply over-the-counter and prescription medicines only as told by your health care provider. These may include nasal sprays.  If you were prescribed an antibiotic medicine, take it as told by your health care provider. Do not stop taking the antibiotic even if you start to feel better. Hydrate and Humidify  Drink enough water to keep your urine clear or pale yellow. Staying hydrated will help to thin your mucus.  Use a cool mist humidifier to keep the humidity level in your home above 50%.  Inhale steam for 10-15 minutes, 3-4 times a day or as told by your health care provider. You can do this in the bathroom while a hot shower is running.  Limit your exposure to cool or dry air. Rest  Rest as much as possible.  Sleep with your head raised (elevated).  Make sure to get enough sleep each night. General instructions  Apply a warm, moist washcloth to your face 3-4 times a day or as told by your health care provider. This will help with discomfort.  Wash your hands often with soap and water to reduce your exposure to viruses and other germs. If soap and water are not available, use hand sanitizer.  Do not smoke. Avoid being around people who are smoking (secondhand smoke).  Keep all follow-up visits as told by your health care provider. This is important. Contact a health care provider if:  You have a fever.  Your symptoms get worse.  Your symptoms do not improve within 10 days. Get help right away if:  You have a severe headache.  You have persistent vomiting.  You have pain or swelling around your face or  eyes.  You have vision problems.  You develop confusion.  Your neck is stiff.  You have trouble breathing. This information is not intended to replace advice given to you by your health care provider. Make sure you discuss any questions you have with your health care provider. Document Released: 07/17/2005 Document Revised: 03/12/2016 Document Reviewed: 05/12/2015 Elsevier Interactive Patient Education  2018 [employer].    Tobacco Use Disorder Tobacco use disorder (TUD) is a mental disorder. It is the long-term use of tobacco in spite of related health problems or difficulty with normal life activities. Tobacco is most commonly smoked as cigarettes and less commonly as cigars or pipes. Smokeless chewing tobacco and snuff are also popular. People with TUD get a feeling of extreme pleasure (euphoria) from using tobacco and have a desire to use it again and again. Repeated use of tobacco can cause problems. The addictive effects of tobacco are due mainly tothe ingredient nicotine. Nicotine also causes a rush of adrenaline (epinephrine) in the body. This leads to increased blood  pressure, heart rate, and breathing rate. These changes may cause problems for people with high blood pressure, weak hearts, or lung disease. High doses of nicotine in children and pets can lead to seizures and death. Tobacco contains a number of other unsafe chemicals. These chemicals are especially harmful when inhaled as smoke and can damage almost every organ in the body. Smokers live shorter lives than nonsmokers and are at risk of dying from a number of diseases and cancers. Tobacco smoke can also cause health problems for nonsmokers (due to inhaling secondhand smoke). Smoking is also a fire hazard. TUD usually starts in the late teenage years and is most common in young adults between the ages of 64 and 79 years. People who start smoking earlier in life are more likely to continue smoking as adults. TUD is somewhat  more common in men than women. People with TUD are at higher risk for using alcohol and other drugs of abuse. What increases the risk? Risk factors for TUD include:  Having family members with the disorder.  Being around people who use tobacco.  Having an existing mental health issue such as schizophrenia, depression, bipolar disorder, ADHD, or posttraumatic stress disorder (PTSD).  What are the signs or symptoms? People with tobacco use disorder have two or more of the following signs and symptoms within 12 months:  Use of more tobacco over a longer period than intended.  Not able to cut down or control tobacco use.  A lot of time spent obtaining or using tobacco.  Strong desire or urge to use tobacco (craving). Cravings may last for 6 months or longer after quitting.  Use of tobacco even when use leads to major problems at work, school, or home.  Use of tobacco even when use leads to relationship problems.  Giving up or cutting down on important life activities because of tobacco use.  Repeatedly using tobacco in situations where it puts you or others in physical danger, like smoking in bed.  Use of tobacco even when it is known that a physical or mental problem is likely related to tobacco use. ? Physical problems are numerous and may include chronic bronchitis, emphysema, lung and other cancers, gum disease, high blood pressure, heart disease, and stroke. ? Mental problems caused by tobacco may include difficulty sleeping and anxiety.  Need to use greater amounts of tobacco to get the same effect. This means you have developed a tolerance.  Withdrawal symptoms as a result of stopping or rapidly cutting back use. These symptoms may last a month or more after quitting and include the following: ? Depressed, anxious, or irritable mood. ? Difficulty concentrating. ? Increased appetite. ? Restlessness or trouble sleeping. ? Use of tobacco to avoid withdrawal symptoms.  How is  this diagnosed? Tobacco use disorder is diagnosed by your health care provider. A diagnosis may be made by:  Your health care provider asking questions about your tobacco use and any problems it may be causing.  A physical exam.  Lab tests.  You may be referred to a mental health professional or addiction specialist.  The severity of tobacco use disorder depends on the number of signs and symptoms you have:  Mild-Two or three symptoms.  Moderate-Four or five symptoms.  Severe-Six or more symptoms.  How is this treated? Many people with tobacco use disorder are unable to quit on their own and need help. Treatment options include the following:  Nicotine replacement therapy (NRT). NRT provides nicotine without the other harmful  chemicals in tobacco. NRT gradually lowers the dosage of nicotine in the body and reduces withdrawal symptoms. NRT is available in over-the-counter forms (gum, lozenges, and skin patches) as well as prescription forms (mouth inhaler and nasal spray).  Medicines.This may include: ? Antidepressant medicine that may reduce nicotine cravings. ? A medicine that acts on nicotine receptors in the brain to reduce cravings and withdrawal symptoms. It may also block the effects of tobacco in people with TUD who relapse.  Counseling or talk therapy. A form of talk therapy called behavioral therapy is commonly used to treat people with TUD. Behavioral therapy looks at triggers for tobacco use, how to avoid them, and how to cope with cravings. It is most effective in person or by phone but is also available in self-help forms (books and Internet websites).  Support groups. These provide emotional support, advice, and guidance for quitting tobacco.  The most effective treatment for TUD is usually a combination of medicine, talk therapy, and support groups. Follow these instructions at home:  Keep all follow-up visits as directed by your health care provider. This is  important.  Take medicines only as directed by your health care provider.  Check with your health care provider before starting new prescription or over-the-counter medicines. Contact a health care provider if:  You are not able to take your medicines as prescribed.  Treatment is not helping your TUD and your symptoms get worse. Get help right away if:  You have serious thoughts about hurting yourself or others.  You have trouble breathing, chest pain, sudden weakness, or sudden numbness in part of your body. This information is not intended to replace advice given to you by your health care provider. Make sure you discuss any questions you have with your health care provider. Document Released: 03/22/2004 Document Revised: 03/19/2016 Document Reviewed: 09/12/2013 Elsevier Interactive Patient Education  [employer].

## 2017-08-31 NOTE — Assessment & Plan Note (Signed)
Tolerating statin, encouraged heart healthy diet, avoid trans fats, minimize simple carbs and saturated fats. Increase exercise as tolerated 

## 2017-09-05 ENCOUNTER — Telehealth: Payer: Self-pay | Admitting: *Deleted

## 2017-09-05 ENCOUNTER — Other Ambulatory Visit: Payer: Self-pay | Admitting: *Deleted

## 2017-09-05 DIAGNOSIS — E785 Hyperlipidemia, unspecified: Secondary | ICD-10-CM

## 2017-09-05 DIAGNOSIS — R739 Hyperglycemia, unspecified: Secondary | ICD-10-CM

## 2017-09-05 NOTE — Telephone Encounter (Signed)
Left detailed message on machine about lab work.

## 2017-09-05 NOTE — Telephone Encounter (Signed)
Back to you

## 2017-09-05 NOTE — Telephone Encounter (Signed)
Copied from [place]. Topic: General - Call Back - No Documentation >> Sep 04, 2017  4:20 PM Burnis Medin, NT wrote: Reason for CRM: Patient called and no CRM noted.

## 2017-09-07 ENCOUNTER — Ambulatory Visit: Payer: Medicare HMO | Admitting: Podiatry

## 2017-09-07 ENCOUNTER — Encounter: Payer: Self-pay | Admitting: Podiatry

## 2017-09-07 DIAGNOSIS — L84 Corns and callosities: Secondary | ICD-10-CM | POA: Diagnosis not present

## 2017-09-07 DIAGNOSIS — M79673 Pain in unspecified foot: Secondary | ICD-10-CM

## 2017-09-09 NOTE — Progress Notes (Signed)
Subjective:   Patient ID: Leah [name], female   DOB: 72 y.o.   MRN: [id number]   HPI Patient presents with significant keratotic lesions plantar aspect both feet that are painful   ROS      Objective:  Physical Exam  Neurovascular status intact with chronic keratotic lesions bilateral     Assessment:  Chronic lesions that are painful bilateral     Plan:  Debride painful lesions and no iatrogenic bleeding and reappoint as needed

## 2017-09-20 ENCOUNTER — Ambulatory Visit (HOSPITAL_BASED_OUTPATIENT_CLINIC_OR_DEPARTMENT_OTHER)
Admission: RE | Admit: 2017-09-20 | Discharge: 2017-09-20 | Disposition: A | Discharge status: Home / Self Care | Payer: Medicare HMO | Source: Ambulatory Visit | Attending: Family Medicine | Admitting: Family Medicine

## 2017-09-20 ENCOUNTER — Ambulatory Visit (INDEPENDENT_AMBULATORY_CARE_PROVIDER_SITE_OTHER): Payer: Medicare HMO | Admitting: Family Medicine

## 2017-09-20 ENCOUNTER — Encounter: Payer: Self-pay | Admitting: Family Medicine

## 2017-09-20 DIAGNOSIS — M542 Cervicalgia: Secondary | ICD-10-CM

## 2017-09-20 DIAGNOSIS — M4322 Fusion of spine, cervical region: Secondary | ICD-10-CM | POA: Insufficient documentation

## 2017-09-20 DIAGNOSIS — G4452 New daily persistent headache (NDPH): Secondary | ICD-10-CM

## 2017-09-20 NOTE — Progress Notes (Signed)
Patient ID: Leah [name], female    DOB: 1945-12-17  Age: 72 y.o. MRN: [id number]    Subjective:  Subjective  HPI Leah [name] presents for worsening headaches that feel the same as prior to her neck surgery.  She called her neurosurgeon who told her to come here and get mri spine and brain first.    About 1 1/2 weeks ago her headaches started to come back and worsened.  Pain is in neck and head ---- no vision changes , no NV.    Review of Systems  Constitutional: Negative for appetite change, diaphoresis, fatigue and unexpected weight change.  Eyes: Negative for pain, redness and visual disturbance.  Respiratory: Negative for cough, chest tightness, shortness of breath and wheezing.   Cardiovascular: Negative for chest pain, palpitations and leg swelling.  Endocrine: Negative for cold intolerance, heat intolerance, polydipsia, polyphagia and polyuria.  Genitourinary: Negative for difficulty urinating, dysuria and frequency.  Musculoskeletal: Positive for neck pain and neck stiffness.  Neurological: Positive for headaches. Negative for dizziness, light-headedness and numbness.    History Past Medical History:  Diagnosis Date  . Anxiety   . Barrett esophagus   . Bladder pain   . Chronic bronchitis ([place])    per pt last episode july 2016  . Complication of anesthesia    slow to wake  . Depression   . Diverticulosis   . Frequency of urination   . GERD (gastroesophageal reflux disease)   . Hiatal hernia   . History of adenomatous polyp of colon    tubular adenoma  . History of chronic gastritis   . History of diverticulitis of colon   . History of esophageal dilatation    for stricture  . History of esophageal spasm   . Hyperlipidemia   . Hypertension   . IBS (irritable bowel syndrome)   . Interstitial cystitis   . Productive cough    intermittant  . Smokers' cough ([place])   . Urgency of urination     She has a past surgical history that includes Carpal tunnel  release (Right, 1993); Sinus surgery with Instatrak (1991); laparoscopic appendectomy (N/A, 09/21/2013); Anterior fusion cervical spine (09/03/2014); Anterior cervical decomp/discectomy fusion (N/A, 09/07/2014); Tonsillectomy and adenoidectomy (1964); Laparoscopic cholecystectomy (2002); Abdominal hysterectomy (1975); Temporomandibular joint surgery (1990); LAPAROSCOPY SIGMOID COLECTOMY (10-05-2008); Microlaryngoscopy (09-27-2006); Colonoscopy (last one 04-30-2013); Esophagogastroduodenoscopy (last one 09-11-2012); CYSTO/  HOD/  BLADDER BX (1990's); cysto with hydrodistension (N/A, 06/08/2015); Colon surgery; and Appendectomy.   Her family history includes Colon polyps in her mother; Diabetes in her father; Heart disease in her father; Hypertension in her father; Leukemia in her father; Pancreatic cancer in her father and mother; Prostate cancer in her father.She reports that she has been smoking cigarettes.  She has a 47.00 pack-year smoking history. she has never used smokeless tobacco. She reports that she does not drink alcohol or use drugs.  Current Outpatient Medications on File Prior to Visit  Medication Sig Dispense Refill  . albuterol (PROVENTIL) (2.5 MG/3ML) 0.083% nebulizer solution Inhale 3 mLs (2.5 mg total) into the lungs every 4 (four) hours as needed for wheezing or shortness of breath. q 4-6 hours as needed 75 mL 0  . aspirin EC 81 MG tablet Take 1 tablet (81 mg total) by mouth daily. Over the counter    . atorvastatin (LIPITOR) 80 MG tablet Take 1 tablet (80 mg total) by mouth daily. 90 tablet 1  . azelastine (ASTELIN) 0.1 % nasal spray Place 2 sprays into both  nostrils 2 (two) times daily. Use in each nostril as directed 30 mL 3  . cyclobenzaprine (FLEXERIL) 5 MG tablet Take 1 tablet (5 mg total) by mouth 3 (three) times daily. 90 tablet 1  . esomeprazole (NEXIUM) 40 MG capsule Take 1 capsule (40 mg total) by mouth 2 (two) times daily before a meal. 180 capsule 3  . estradiol (CLIMARA -  DOSED IN MG/24 HR) 0.1 mg/24hr patch Place 0.1 mg onto the skin every Monday.  0  . ezetimibe (ZETIA) 10 MG tablet Take 1 tablet (10 mg total) by mouth daily. 90 tablet 1  . fluticasone (FLONASE) 50 MCG/ACT nasal spray Place 2 sprays into both nostrils daily. 16 g 6  . Fluticasone-Salmeterol (ADVAIR DISKUS) 250-50 MCG/DOSE AEPB Inhale 1 puff into the lungs 2 (two) times daily. 1 each 5  . hydrOXYzine (ATARAX/VISTARIL) 10 MG tablet 1-2 tab po tid as needed rash or itching 60 tablet 0  . ibuprofen (ADVIL,MOTRIN) 200 MG tablet Take 400 mg by mouth every 6 (six) hours as needed (pain).    Marland Kitchen lisinopril (PRINIVIL,ZESTRIL) 20 MG tablet Take 1 tablet (20 mg total) by mouth daily. 30 tablet 3  . LORazepam (ATIVAN) 0.5 MG tablet Take 0.5 mg by mouth at bedtime.   5  . polyethylene glycol (MIRALAX / GLYCOLAX) packet Take 17 g by mouth daily as needed for moderate constipation. 14 each 0  . senna-docusate (SENOKOT-S) 8.6-50 MG tablet Take 1 tablet by mouth at bedtime. 30 tablet 0  . sertraline (ZOLOFT) 50 MG tablet Take 50 mg by mouth at bedtime.    . sodium chloride (OCEAN) 0.65 % SOLN nasal spray Place 1 spray into both nostrils daily as needed for congestion.    Marland Kitchen tiotropium (SPIRIVA HANDIHALER) 18 MCG inhalation capsule Place 1 capsule (18 mcg total) into inhaler and inhale daily. 90 capsule 1  . tiZANidine (ZANAFLEX) 4 MG capsule Take 1 capsule (4 mg total) by mouth 3 (three) times daily as needed for muscle spasms. 30 capsule 0  . triamcinolone (NASACORT) 55 MCG/ACT AERO nasal inhaler Place 2 sprays into the nose daily. 1 Inhaler 12   No current facility-administered medications on file prior to visit.      Objective:  Objective  Physical Exam  Constitutional: She is oriented to person, place, and time. She appears well-developed and well-nourished.  HENT:  Head: Normocephalic and atraumatic.  Eyes: Conjunctivae and EOM are normal.  Neck: Normal range of motion. Neck supple. No JVD present.  Carotid bruit is not present. No thyromegaly present.  Cardiovascular: Normal rate, regular rhythm and normal heart sounds.  No murmur heard. Pulmonary/Chest: Effort normal and breath sounds normal. No respiratory distress. She has no wheezes. She has no rales. She exhibits no tenderness.  Musculoskeletal: She exhibits no edema or tenderness.  Neurological: She is alert and oriented to person, place, and time. She has normal reflexes. She displays normal reflexes. No cranial nerve deficit. She exhibits normal muscle tone. Coordination normal.  Psychiatric: She has a normal mood and affect.  Nursing note and vitals reviewed.  BP 110/68 (BP Location: Right Arm, Cuff Size: Normal)   Pulse 63   Temp 98.1 F (36.7 C) (Oral)   Resp 16   Ht 5\' 1"  (1.549 m)   Wt 119 lb 12.8 oz (54.3 kg)   SpO2 93%   BMI 22.64 kg/m  Wt Readings from Last 3 Encounters:  09/20/17 119 lb 12.8 oz (54.3 kg)  08/31/17 118 lb 9.6 oz (53.8 kg)  08/28/17 118 lb (53.5 kg)     Lab Results  Component Value Date   WBC 5.4 07/12/2016   HGB 13.8 07/12/2016   HCT 41.4 07/12/2016   PLT 215.0 07/12/2016   GLUCOSE 111 (H) 08/28/2017   CHOL 189 08/28/2017   TRIG 109.0 08/28/2017   HDL 40.50 08/28/2017   LDLDIRECT 151.0 11/07/2016   LDLCALC 127 (H) 08/28/2017   ALT 12 08/28/2017   AST 11 08/28/2017   NA 142 08/28/2017   K 4.8 08/28/2017   CL 105 08/28/2017   CREATININE 0.81 08/28/2017   BUN 12 08/28/2017   CO2 31 08/28/2017   TSH 1.92 03/30/2016   INR 1.0 05/21/2009   HGBA1C 6.1 09/19/2011   MICROALBUR <0.7 11/09/2014    Ct Maxillofacial Wo Cm  Result Date: 03/07/2017 CLINICAL DATA:  72 y/o F; chronic sinusitis with left-sided facial pain pressure. EXAM: CT MAXILLOFACIAL WITHOUT CONTRAST TECHNIQUE: Multidetector CT images of the paranasal sinuses were obtained using the standard protocol without intravenous contrast. COMPARISON:  07/13/2014 MRI the head. FINDINGS: Paranasal sinuses: Frontal: Minimal mucosal  thickening of the frontal sinus antrum. Patent frontal sinus drainage pathways. Ethmoid: Ethmoidectomy with thickening of the walls of ethmoid cavity compatible with sequelae of chronic inflammation. Maxillary: Normally aerated. Sphenoid: Normally aerated. Patent sphenoethmoidal recesses. Right ostiomeatal unit: Widely patent maxillary antrostomies. Left ostiomeatal unit: Widely patent maxillary antrostomy. Nasal passages: Patent. Intact nasal septum is midline. Anatomy: No pneumatization superior to anterior ethmoid notches. Symmetric and intact olfactory grooves and fovea ethmoidalis, Keros II (4-91mm) Sellar sphenoid pneumatization pattern. No dehiscence of carotid or optic canals. No onodi cell. Other: Orbits and intracranial compartment are unremarkable. Visible mastoid air cells are normally aerated. IMPRESSION: Minimal frontal sinus mucosal thickening. Post bilateral ethmoidectomy and maxillary antrostomy. Patent sinus drainage pathways. No fluid levels. Electronically Signed   By: Kristine Garbe M.D.   On: 03/07/2017 14:04     Assessment & Plan:  Plan  I have discontinued Leah [name]'s doxycycline. I am also having her maintain her sertraline, LORazepam, fluticasone, estradiol, sodium chloride, ibuprofen, cyclobenzaprine, polyethylene glycol, senna-docusate, aspirin EC, ezetimibe, lisinopril, azelastine, tiZANidine, albuterol, atorvastatin, tiotropium, esomeprazole, hydrOXYzine, Fluticasone-Salmeterol, and triamcinolone.  No orders of the defined types were placed in this encounter.   Problem List Items Addressed This Visit      Unprioritized   NECK PAIN, LEFT   Relevant Orders   MR Cervical Spine Wo Contrast   DG Cervical Spine Complete (Completed)    Other Visit Diagnoses    New daily persistent headache       Relevant Orders   MR Brain Wo Contrast    mri s ordered per ns request  She has an appointment for June-----but states they could possibly see her sooner if  there was abnormality on mri  Follow-up: Return if symptoms worsen or fail to improve.  [name], DO

## 2017-09-20 NOTE — Patient Instructions (Signed)

## 2017-09-24 ENCOUNTER — Ambulatory Visit (HOSPITAL_COMMUNITY)
Admission: RE | Admit: 2017-09-24 | Discharge: 2017-09-24 | Disposition: A | Discharge status: Home / Self Care | Payer: Medicare HMO | Source: Ambulatory Visit | Attending: Family Medicine | Admitting: Family Medicine

## 2017-09-24 DIAGNOSIS — G4452 New daily persistent headache (NDPH): Secondary | ICD-10-CM | POA: Insufficient documentation

## 2017-09-24 DIAGNOSIS — R51 Headache: Secondary | ICD-10-CM | POA: Diagnosis not present

## 2017-09-25 ENCOUNTER — Ambulatory Visit (HOSPITAL_COMMUNITY): Admission: RE | Admit: 2017-09-25 | Payer: Medicare HMO | Source: Ambulatory Visit

## 2017-10-08 ENCOUNTER — Encounter: Payer: Self-pay | Admitting: Family Medicine

## 2017-10-08 ENCOUNTER — Ambulatory Visit (INDEPENDENT_AMBULATORY_CARE_PROVIDER_SITE_OTHER): Payer: Medicare HMO | Admitting: Family Medicine

## 2017-10-08 VITALS — BP 130/62 | HR 78 | Temp 98.1°F | Resp 16 | Ht 61.02 in | Wt 122.6 lb

## 2017-10-08 DIAGNOSIS — R252 Cramp and spasm: Secondary | ICD-10-CM

## 2017-10-08 DIAGNOSIS — M542 Cervicalgia: Secondary | ICD-10-CM | POA: Diagnosis not present

## 2017-10-08 DIAGNOSIS — R350 Frequency of micturition: Secondary | ICD-10-CM

## 2017-10-08 LAB — POC URINALSYSI DIPSTICK (AUTOMATED)
BILIRUBIN UA: NEGATIVE
GLUCOSE UA: NEGATIVE
KETONES UA: NEGATIVE
Leukocytes, UA: NEGATIVE
Nitrite, UA: NEGATIVE
Protein, UA: NEGATIVE
RBC UA: NEGATIVE
Urobilinogen, UA: 0.2 E.U./dL
pH, UA: 6 (ref 5.0–8.0)

## 2017-10-08 MED ORDER — TIZANIDINE HCL 4 MG PO CAPS: 4.0000 mg | ORAL_CAPSULE | Freq: Three times a day (TID) | ORAL | 0 refills | Status: DC | PRN

## 2017-10-08 NOTE — Patient Instructions (Signed)
Cervical Radiculopathy  Cervical radiculopathy means that a nerve in the neck is pinched or bruised. This can cause pain or loss of feeling (numbness) that runs from your neck to your arm and fingers.  Follow these instructions at home:  Managing pain  ? Take over-the-counter and prescription medicines only as told by your doctor.  ? If directed, put ice on the injured or painful area.  ? Put ice in a plastic bag.  ? Place a towel between your skin and the bag.  ? Leave the ice on for 20 minutes, 2?3 times per day.  ? If ice does not help, you can try using heat. Take a warm shower or warm bath, or use a heat pack as told by your doctor.  ? You may try a gentle neck and shoulder massage.  Activity  ? Rest as needed. Follow instructions from your doctor about any activities to avoid.  ? Do exercises as told by your doctor or physical therapist.  General instructions  ? If you were given a soft collar, wear it as told by your doctor.  ? Use a flat pillow when you sleep.  ? Keep all follow-up visits as told by your doctor. This is important.  Contact a doctor if:  ? Your condition does not improve with treatment.  Get help right away if:  ? Your pain gets worse and is not controlled with medicine.  ? You lose feeling or feel weak in your hand, arm, face, or leg.  ? You have a fever.  ? You have a stiff neck.  ? You cannot control when you poop or pee (have incontinence).  ? You have trouble with walking, balance, or talking.  This information is not intended to replace advice given to you by your health care provider. Make sure you discuss any questions you have with your health care provider.  Document Released: 07/06/2011 Document Revised: 12/23/2015 Document Reviewed: 09/10/2014  Elsevier Interactive Patient Education ? 2018 Elsevier Inc.

## 2017-10-08 NOTE — Progress Notes (Signed)
Subjective:  I acted as a Education administrator for [employer]. Yancey Flemings, [place]   Patient ID: [name], female    DOB: [birth year], 72 y.o.   MRN: [id number]  Chief Complaint  Patient presents with  . Headache    ongoing for about a month    HPI  Patient is in today for recurrent headaches.  Pt had mri brain done but not the c spine.  According to [facility] she no showed.   Pt states she thought they were doing both on the same day and did not realize they were on different days.  Symptoms no better.  MRI brain-- normal  Patient Care Team: Carollee Herter, Alferd Apa, DO as PCP - General   Past Medical History:  Diagnosis Date  . Anxiety   . Barrett esophagus   . Bladder pain   . Chronic bronchitis ([place])    per pt last episode july 2016  . Complication of anesthesia    slow to wake  . Depression   . Diverticulosis   . Frequency of urination   . GERD (gastroesophageal reflux disease)   . Hiatal hernia   . History of adenomatous polyp of colon    tubular adenoma  . History of chronic gastritis   . History of diverticulitis of colon   . History of esophageal dilatation    for stricture  . History of esophageal spasm   . Hyperlipidemia   . Hypertension   . IBS (irritable bowel syndrome)   . Interstitial cystitis   . Productive cough    intermittant  . Smokers' cough ([place])   . Urgency of urination     Past Surgical History:  Procedure Laterality Date  . ABDOMINAL HYSTERECTOMY  1975  . ANTERIOR CERVICAL DECOMP/DISCECTOMY FUSION N/A 09/07/2014   Procedure: Exploration of Fusion and Removal of Anterior Cervical Hematoma;  Surgeon: Newman Pies, MD;  Location: [facility];  Service: Neurosurgery;  Laterality: N/A;  . ANTERIOR FUSION CERVICAL SPINE  09/03/2014   C5 -- C7  . APPENDECTOMY    . CARPAL TUNNEL RELEASE Right 1993  . COLON SURGERY    . COLONOSCOPY  last one 04-30-2013  . CYSTO WITH HYDRODISTENSION N/A 06/08/2015   Procedure: CYSTOSCOPY/HYDRODISTENSION INSTILLATION OF  MARCAINE AND PYRIDIUM;  Surgeon: Irine Seal, MD;  Location: [facility];  Service: Urology;  Laterality: N/A;  . CYSTO/  HOD/  BLADDER BX  1990's  . ESOPHAGOGASTRODUODENOSCOPY  last one 09-11-2012  . LAPAROSCOPIC APPENDECTOMY N/A 09/21/2013   Procedure: APPENDECTOMY LAPAROSCOPIC;  Surgeon: Rolm Bookbinder, MD;  Location: [place];  Service: General;  Laterality: N/A;  . LAPAROSCOPIC CHOLECYSTECTOMY  2002  . LAPAROSCOPY SIGMOID COLECTOMY  10-05-2008   diverticulitis  . MICROLARYNGOSCOPY  09-27-2006   w/ true vocal cord stripping  and bilateral bx's of lesion's (benign)  . SINUS SURGERY WITH [place]  . [place]  . TONSILLECTOMY AND ADENOIDECTOMY  1964    Family History  Problem Relation Age of Onset  . Pancreatic cancer Mother   . Colon polyps Mother   . Pancreatic cancer Father   . Diabetes Father   . Heart disease Father   . Leukemia Father   . Prostate cancer Father   . Hypertension Father   . Colon cancer Neg Hx     Social History   Socioeconomic History  . Marital status: Married    Spouse name: Not on file  . Number of children: Not on file  .  Years of education: Not on file  . Highest education level: Not on file  Social Needs  . Financial resource strain: Not on file  . Food insecurity - worry: Not on file  . Food insecurity - inability: Not on file  . Transportation needs - medical: Not on file  . Transportation needs - non-medical: Not on file  Occupational History  . Not on file  Tobacco Use  . Smoking status: Current Every Day Smoker    Packs/day: 1.00    Years: 47.00    Pack years: 47.00    Types: Cigarettes  . Smokeless tobacco: Never Used  . Tobacco comment: form given 02/17/16  Substance and Sexual Activity  . Alcohol use: No    Alcohol/week: 0.0 oz  . Drug use: No  . Sexual activity: Not Currently    Birth control/protection: Post-menopausal  Other Topics Concern  . Not on file  Social History  Narrative  . Not on file    Outpatient Medications Prior to Visit  Medication Sig Dispense Refill  . albuterol (PROVENTIL) (2.5 MG/3ML) 0.083% nebulizer solution Inhale 3 mLs (2.5 mg total) into the lungs every 4 (four) hours as needed for wheezing or shortness of breath. q 4-6 hours as needed 75 mL 0  . aspirin EC 81 MG tablet Take 1 tablet (81 mg total) by mouth daily. Over the counter    . atorvastatin (LIPITOR) 80 MG tablet Take 1 tablet (80 mg total) by mouth daily. 90 tablet 1  . azelastine (ASTELIN) 0.1 % nasal spray Place 2 sprays into both nostrils 2 (two) times daily. Use in each nostril as directed 30 mL 3  . cyclobenzaprine (FLEXERIL) 5 MG tablet Take 1 tablet (5 mg total) by mouth 3 (three) times daily. 90 tablet 1  . esomeprazole (NEXIUM) 40 MG capsule Take 1 capsule (40 mg total) by mouth 2 (two) times daily before a meal. 180 capsule 3  . estradiol (CLIMARA - DOSED IN MG/24 HR) 0.1 mg/24hr patch Place 0.1 mg onto the skin every Monday.  0  . ezetimibe (ZETIA) 10 MG tablet Take 1 tablet (10 mg total) by mouth daily. 90 tablet 1  . fluticasone (FLONASE) 50 MCG/ACT nasal spray Place 2 sprays into both nostrils daily. 16 g 6  . Fluticasone-Salmeterol (ADVAIR DISKUS) 250-50 MCG/DOSE AEPB Inhale 1 puff into the lungs 2 (two) times daily. 1 each 5  . hydrOXYzine (ATARAX/VISTARIL) 10 MG tablet 1-2 tab po tid as needed rash or itching 60 tablet 0  . ibuprofen (ADVIL,MOTRIN) 200 MG tablet Take 400 mg by mouth every 6 (six) hours as needed (pain).    Marland Kitchen lisinopril (PRINIVIL,ZESTRIL) 20 MG tablet Take 1 tablet (20 mg total) by mouth daily. 30 tablet 3  . LORazepam (ATIVAN) 0.5 MG tablet Take 0.5 mg by mouth at bedtime.   5  . polyethylene glycol (MIRALAX / GLYCOLAX) packet Take 17 g by mouth daily as needed for moderate constipation. 14 each 0  . senna-docusate (SENOKOT-S) 8.6-50 MG tablet Take 1 tablet by mouth at bedtime. 30 tablet 0  . sertraline (ZOLOFT) 50 MG tablet Take 50 mg by mouth  at bedtime.    . sodium chloride (OCEAN) 0.65 % SOLN nasal spray Place 1 spray into both nostrils daily as needed for congestion.    Marland Kitchen tiotropium (SPIRIVA HANDIHALER) 18 MCG inhalation capsule Place 1 capsule (18 mcg total) into inhaler and inhale daily. 90 capsule 1  . triamcinolone (NASACORT) 55 MCG/ACT AERO nasal inhaler Place 2  sprays into the nose daily. 1 Inhaler 12  . tiZANidine (ZANAFLEX) 4 MG capsule Take 1 capsule (4 mg total) by mouth 3 (three) times daily as needed for muscle spasms. 30 capsule 0   No facility-administered medications prior to visit.     Allergies  Allergen Reactions  . Amoxicillin-Pot Clavulanate Shortness Of Breath, Itching and Swelling  . Avelox [Moxifloxacin Hcl In Nacl] Swelling    Swelling, itching and shortness of breath  . Ciprofloxacin Shortness Of Breath, Itching and Swelling  . Keflex [Cephalexin] Shortness Of Breath and Swelling  . Moxifloxacin Shortness Of Breath, Itching and Swelling  . Penicillins Shortness Of Breath, Itching and Swelling  . Sulfa Antibiotics Shortness Of Breath, Itching and Swelling  . Prednisone Other (See Comments)    GI irritation; is able to tolerate injections  . Acyclovir And Related Other (See Comments)    Pt does not recall this reaction    Review of Systems  Constitutional: Negative for chills, fever and malaise/fatigue.  HENT: Negative for congestion and hearing loss.   Eyes: Negative for discharge.  Respiratory: Negative for cough, sputum production and shortness of breath.   Cardiovascular: Negative for chest pain, palpitations and leg swelling.  Gastrointestinal: Negative for abdominal pain, blood in stool, constipation, diarrhea, heartburn, nausea and vomiting.  Genitourinary: Negative for dysuria, frequency, hematuria and urgency.  Musculoskeletal: Negative for back pain, falls and myalgias.  Skin: Negative for rash.  Neurological: Positive for headaches. Negative for dizziness, sensory change, loss of  consciousness and weakness.  Endo/Heme/Allergies: Negative for environmental allergies. Does not bruise/bleed easily.  Psychiatric/Behavioral: Negative for depression and suicidal ideas. The patient is not nervous/anxious and does not have insomnia.        Objective:    Physical Exam  Constitutional: She is oriented to person, place, and time. She appears well-developed and well-nourished.  HENT:  Head: Normocephalic and atraumatic.  Eyes: Conjunctivae and EOM are normal.  Neck: Normal range of motion. Neck supple. No JVD present. Carotid bruit is not present. No thyromegaly present.  Cardiovascular: Normal rate, regular rhythm and normal heart sounds.  No murmur heard. Pulmonary/Chest: Effort normal and breath sounds normal. No respiratory distress. She has no wheezes. She has no rales. She exhibits no tenderness.  Musculoskeletal: She exhibits no edema.  Neurological: She is alert and oriented to person, place, and time.  Psychiatric: She has a normal mood and affect.  Nursing note and vitals reviewed.   BP 130/62 (BP Location: Left Arm, Patient Position: Sitting, Cuff Size: Normal)   Pulse 78   Temp 98.1 F (36.7 C) (Oral)   Resp 16   Ht 5' 1.02" (1.55 m)   Wt 122 lb 9.6 oz (55.6 kg)   SpO2 94%   BMI 23.15 kg/m  Wt Readings from Last 3 Encounters:  10/08/17 122 lb 9.6 oz (55.6 kg)  09/20/17 119 lb 12.8 oz (54.3 kg)  08/31/17 118 lb 9.6 oz (53.8 kg)   BP Readings from Last 3 Encounters:  10/08/17 130/62  09/20/17 110/68  08/31/17 100/62     Immunization History  Administered Date(s) Administered  . Influenza Split 04/30/2013  . Influenza Whole 12/11/2007, 04/30/2012  . Influenza, High Dose Seasonal PF 07/10/2016, 06/06/2017  . Influenza,inj,Quad PF,6+ Mos 06/08/2014, 04/27/2015  . Pneumococcal Conjugate-13 06/20/2013  . Pneumococcal Polysaccharide-23 12/11/2007  . Td 02/22/2009    Health Maintenance  Topic Date Due  . PNA vac Low Risk Adult (2 of 2 - PPSV23)  06/20/2014  . COLONOSCOPY  04/30/2018  . MAMMOGRAM  08/01/2018  . TETANUS/TDAP  02/23/2019  . INFLUENZA VACCINE  Completed  . DEXA SCAN  Completed  . Hepatitis C Screening  Completed    Lab Results  Component Value Date   WBC 5.4 07/12/2016   HGB 13.8 07/12/2016   HCT 41.4 07/12/2016   PLT 215.0 07/12/2016   GLUCOSE 111 (H) 08/28/2017   CHOL 189 08/28/2017   TRIG 109.0 08/28/2017   HDL 40.50 08/28/2017   LDLDIRECT 151.0 11/07/2016   LDLCALC 127 (H) 08/28/2017   ALT 12 08/28/2017   AST 11 08/28/2017   NA 142 08/28/2017   K 4.8 08/28/2017   CL 105 08/28/2017   CREATININE 0.81 08/28/2017   BUN 12 08/28/2017   CO2 31 08/28/2017   TSH 1.92 03/30/2016   INR 1.0 05/21/2009   HGBA1C 6.1 09/19/2011   MICROALBUR <0.7 11/09/2014    Lab Results  Component Value Date   TSH 1.92 03/30/2016   Lab Results  Component Value Date   WBC 5.4 07/12/2016   HGB 13.8 07/12/2016   HCT 41.4 07/12/2016   MCV 88.7 07/12/2016   PLT 215.0 07/12/2016   Lab Results  Component Value Date   NA 142 08/28/2017   K 4.8 08/28/2017   CO2 31 08/28/2017   GLUCOSE 111 (H) 08/28/2017   BUN 12 08/28/2017   CREATININE 0.81 08/28/2017   BILITOT 0.3 08/28/2017   ALKPHOS 69 08/28/2017   AST 11 08/28/2017   ALT 12 08/28/2017   PROT 6.8 08/28/2017   ALBUMIN 4.0 08/28/2017   CALCIUM 9.1 08/28/2017   ANIONGAP 6 04/12/2016   GFR 74.46 08/28/2017   Lab Results  Component Value Date   CHOL 189 08/28/2017   Lab Results  Component Value Date   HDL 40.50 08/28/2017   Lab Results  Component Value Date   LDLCALC 127 (H) 08/28/2017   Lab Results  Component Value Date   TRIG 109.0 08/28/2017   Lab Results  Component Value Date   CHOLHDL 5 08/28/2017   Lab Results  Component Value Date   HGBA1C 6.1 09/19/2011         Assessment & Plan:   Problem List Items Addressed This Visit      Unprioritized   Neck pain   NECK PAIN, LEFT    MRI c spine pending Muscle relaxer Neuro surgeon  pending       Other Visit Diagnoses    Frequent urination    -  Primary   Relevant Orders   POCT Urinalysis Dipstick (Automated) (Completed)   Spasm       Relevant Medications   tiZANidine (ZANAFLEX) 4 MG capsule      I am having [name]. Cales maintain her sertraline, LORazepam, fluticasone, estradiol, sodium chloride, ibuprofen, cyclobenzaprine, polyethylene glycol, senna-docusate, aspirin EC, ezetimibe, lisinopril, azelastine, albuterol, atorvastatin, tiotropium, esomeprazole, hydrOXYzine, Fluticasone-Salmeterol, triamcinolone, and tiZANidine.  Meds ordered this encounter  Medications  . tiZANidine (ZANAFLEX) 4 MG capsule    Sig: Take 1 capsule (4 mg total) by mouth 3 (three) times daily as needed for muscle spasms.    Dispense:  30 capsule    Refill:  0    CMA served as scribe during this visit. History, Physical and Plan performed by medical provider. Documentation and orders reviewed and attested to.  [name], DO

## 2017-10-08 NOTE — Assessment & Plan Note (Signed)
MRI c spine pending Muscle relaxer Neuro surgeon pending

## 2017-10-09 ENCOUNTER — Telehealth: Payer: Self-pay | Admitting: Family Medicine

## 2017-10-09 ENCOUNTER — Ambulatory Visit (HOSPITAL_COMMUNITY)
Admission: RE | Admit: 2017-10-09 | Discharge: 2017-10-09 | Disposition: A | Discharge status: Home / Self Care | Payer: Medicare HMO | Source: Ambulatory Visit | Attending: Family Medicine | Admitting: Family Medicine

## 2017-10-09 DIAGNOSIS — M542 Cervicalgia: Secondary | ICD-10-CM

## 2017-10-09 DIAGNOSIS — M8938 Hypertrophy of bone, other site: Secondary | ICD-10-CM | POA: Diagnosis not present

## 2017-10-09 DIAGNOSIS — M4802 Spinal stenosis, cervical region: Secondary | ICD-10-CM | POA: Insufficient documentation

## 2017-10-09 MED ORDER — TIZANIDINE HCL 4 MG PO TABS: 4.0000 mg | ORAL_TABLET | Freq: Three times a day (TID) | ORAL | 0 refills | Status: DC | PRN

## 2017-10-09 NOTE — Telephone Encounter (Signed)
Zanaflex.   The capsules are not covered by her insurance.   Needs to be switched to tablets.  Thanks. Dr. Carollee Herter  [facility] [phone] - [place], [place]  [address]

## 2017-10-09 NOTE — Telephone Encounter (Signed)
rx fixed and sent in

## 2017-10-09 NOTE — Addendum Note (Signed)
Addended by: Kem Boroughs D on: 10/09/2017 03:00 PM   Modules accepted: Orders

## 2017-10-09 NOTE — Telephone Encounter (Signed)
Copied from [place] [id number]. Topic: Quick Communication - Rx Refill/Question >> Oct 09, 2017 12:25 PM Waylan Rocher, Lumin L wrote: Medication:  tiZANidine (ZANAFLEX) 4 MG capsule  Has the patient contacted their pharmacy? Yes.    (Agent: If no, request that the patient contact the pharmacy for the refill.)  Preferred Pharmacy (with phone number or street name): [id number] [place], [place] [facility]  [address] Phone: [phone] Fax: [phone]  Agent: Please be advised that RX refills may take up to 3 business days. We ask that you follow-up with your pharmacy.  The capsules are not covered by her insurance. Needs to be switched to tablets.

## 2017-10-13 DIAGNOSIS — N3091 Cystitis, unspecified with hematuria: Secondary | ICD-10-CM | POA: Diagnosis not present

## 2017-10-13 DIAGNOSIS — N3000 Acute cystitis without hematuria: Secondary | ICD-10-CM | POA: Diagnosis not present

## 2017-10-17 ENCOUNTER — Telehealth: Payer: Self-pay | Admitting: *Deleted

## 2017-10-17 DIAGNOSIS — I10 Essential (primary) hypertension: Secondary | ICD-10-CM | POA: Diagnosis not present

## 2017-10-17 DIAGNOSIS — M542 Cervicalgia: Secondary | ICD-10-CM

## 2017-10-17 NOTE — Telephone Encounter (Signed)
See MRI.  Left message on machine.  Referral placed to neurosurgery

## 2017-10-17 NOTE — Telephone Encounter (Signed)
Copied from [place]. Topic: Inquiry >> Oct 16, 2017  2:39 PM Conception Chancy, NT wrote: Patient states she had a MRI done on 10/09/17 and has not heard the results. She is requesting a call back. Please advise.

## 2017-10-18 ENCOUNTER — Other Ambulatory Visit: Payer: Self-pay | Admitting: Medical

## 2017-10-22 ENCOUNTER — Telehealth: Payer: Self-pay | Admitting: *Deleted

## 2017-10-22 NOTE — Telephone Encounter (Signed)
Copied from [place] [id number]. Topic: Referral - Request >> Oct 22, 2017 10:26 AM Neva Seat wrote: Pt needing referral to [facility].

## 2017-10-24 DIAGNOSIS — N3010 Interstitial cystitis (chronic) without hematuria: Secondary | ICD-10-CM | POA: Diagnosis not present

## 2017-10-24 NOTE — Telephone Encounter (Signed)
No answer/ no vm  Need to know why she needs referral?  Frequent urination?

## 2017-10-24 NOTE — Telephone Encounter (Signed)
Copied from [place]. Topic: Quick Communication - Rx Refill/Question >> Oct 24, 2017 10:29 AM Percell Belt A wrote: Medication: lisinopril (PRINIVIL,ZESTRIL) 20 MG tablet [[id number]]  Has the patient contacted their pharmacy? yes (Agent: If no, request that the patient contact the pharmacy for the refill.) Preferred Pharmacy (with phone number [facility] [address] Agent: Please be advised that RX refills may take up to 3 business days. We ask that you follow-up with your pharmacy.

## 2017-10-25 NOTE — Telephone Encounter (Signed)
Tried calling patient again no answer

## 2017-10-31 ENCOUNTER — Encounter: Payer: Self-pay | Admitting: *Deleted

## 2017-10-31 NOTE — Telephone Encounter (Signed)
Unable to reach letter sent

## 2017-12-04 ENCOUNTER — Other Ambulatory Visit: Payer: Self-pay | Admitting: *Deleted

## 2017-12-04 DIAGNOSIS — K219 Gastro-esophageal reflux disease without esophagitis: Secondary | ICD-10-CM

## 2017-12-04 MED ORDER — ATORVASTATIN CALCIUM 80 MG PO TABS: 80.0000 mg | ORAL_TABLET | Freq: Every day | ORAL | 1 refills | Status: DC

## 2017-12-04 MED ORDER — ESOMEPRAZOLE MAGNESIUM 40 MG PO CPDR: 40.0000 mg | DELAYED_RELEASE_CAPSULE | Freq: Two times a day (BID) | ORAL | 1 refills | Status: DC

## 2017-12-04 NOTE — Addendum Note (Signed)
Addended by: Kem Boroughs D on: 12/04/2017 08:24 AM   Modules accepted: Orders

## 2017-12-13 ENCOUNTER — Encounter: Payer: Self-pay | Admitting: Family Medicine

## 2017-12-13 ENCOUNTER — Ambulatory Visit (INDEPENDENT_AMBULATORY_CARE_PROVIDER_SITE_OTHER): Payer: Medicare HMO | Admitting: Family Medicine

## 2017-12-13 VITALS — BP 132/66 | HR 62 | Temp 98.1°F | Resp 16 | Ht 61.0 in | Wt 118.6 lb

## 2017-12-13 DIAGNOSIS — E785 Hyperlipidemia, unspecified: Secondary | ICD-10-CM | POA: Diagnosis not present

## 2017-12-13 DIAGNOSIS — Z0100 Encounter for examination of eyes and vision without abnormal findings: Secondary | ICD-10-CM | POA: Diagnosis not present

## 2017-12-13 DIAGNOSIS — Z23 Encounter for immunization: Secondary | ICD-10-CM | POA: Diagnosis not present

## 2017-12-13 DIAGNOSIS — J439 Emphysema, unspecified: Secondary | ICD-10-CM | POA: Diagnosis not present

## 2017-12-13 DIAGNOSIS — Z0000 Encounter for general adult medical examination without abnormal findings: Secondary | ICD-10-CM | POA: Diagnosis not present

## 2017-12-13 DIAGNOSIS — F17200 Nicotine dependence, unspecified, uncomplicated: Secondary | ICD-10-CM | POA: Diagnosis not present

## 2017-12-13 DIAGNOSIS — I10 Essential (primary) hypertension: Secondary | ICD-10-CM

## 2017-12-13 MED ORDER — LISINOPRIL 20 MG PO TABS: 20.0000 mg | ORAL_TABLET | Freq: Every day | ORAL | 1 refills | Status: DC

## 2017-12-13 NOTE — Patient Instructions (Addendum)
Smoking Tobacco Information Smoking tobacco will very likely harm your health. Tobacco contains a poisonous (toxic), colorless chemical called nicotine. Nicotine affects the brain and makes tobacco addictive. This change in your brain can make it hard to stop smoking. Tobacco also has other toxic chemicals that can hurt your body and raise your risk of many cancers. How can smoking tobacco affect me? Smoking tobacco can increase your chances of having serious health conditions, such as:  Cancer. Smoking is most commonly associated with lung cancer, but can lead to cancer in other parts of the body.  Chronic obstructive pulmonary disease (COPD). This is a long-term lung condition that makes it hard to breathe. It also gets worse over time.  High blood pressure (hypertension), heart disease, stroke, or heart attack.  Lung infections, such as pneumonia.  Cataracts. This is when the lenses in the eyes become clouded.  Digestive problems. This may include peptic ulcers, heartburn, and gastroesophageal reflux disease (GERD).  Oral health problems, such as gum disease and tooth loss.  Loss of taste and smell.  Smoking can affect your appearance by causing:  Wrinkles.  Yellow or stained teeth, fingers, and fingernails.  Smoking tobacco can also affect your social life.  Many workplaces, [employer], hotels, and public places are tobacco-free. This means that you may experience challenges in finding places to smoke when away from home.  The cost of a smoking habit can be expensive. Expenses for someone who smokes come in two ways: ? You spend money on a regular basis to buy tobacco. ? Your health care costs in the long-term are higher if you smoke.  Tobacco smoke can also affect the health of those around you. Children of smokers have greater chances of: ? Sudden infant death syndrome (SIDS). ? Ear infections. ? Lung infections.  What lifestyle changes can be made?  Do not start  smoking. Quit if you already do.  To quit smoking: ? Make a plan to quit smoking and commit yourself to it. Look for programs to help you and ask your health care provider for recommendations and ideas. ? Talk with your health care provider about using nicotine replacement medicines to help you quit. Medicine replacement medicines include gum, lozenges, patches, sprays, or pills. ? Do not replace cigarette smoking with electronic cigarettes, which are commonly called e-cigarettes. The safety of e-cigarettes is not known, and some may contain harmful chemicals. ? Avoid places, people, or situations that tempt you to smoke. ? If you try to quit but return to smoking, don't give up hope. It is very common for people to try a number of times before they fully succeed. When you feel ready again, give it another try.  Quitting smoking might affect the way you eat as well as your weight. Be prepared to monitor your eating habits. Get support in planning and following a healthy diet.  Ask your health care provider about having regular tests (screenings) to check for cancer. This may include blood tests, imaging tests, and other tests.  Exercise regularly. Consider taking walks, joining a gym, or doing yoga or exercise classes.  Develop skills to manage your stress. These skills include meditation. What are the benefits of quitting smoking? By quitting smoking, you may:  Lower your risk of getting cancer and other diseases caused by smoking.  Live longer.  Breathe better.  Lower your blood pressure and heart rate.  Stop your addiction to tobacco.  Stop creating secondhand smoke that hurts other people.  Improve your  sense of taste and smell.  Look better over time, due to having fewer wrinkles and less staining.  What can happen if changes are not made? If you do not stop smoking, you may:  Get cancer and other diseases.  Develop COPD or other long-term (chronic) lung  conditions.  Develop serious problems with your heart and blood vessels (cardiovascular system).  Need more tests to screen for problems caused by smoking.  Have higher, long-term healthcare costs from medicines or treatments related to smoking.  Continue to have worsening changes in your lungs, mouth, and nose.  Where to find support: To get support to quit smoking, consider:  Asking your health care provider for more information and resources.  Taking classes to learn more about quitting smoking.  Looking for local organizations that offer resources about quitting smoking.  Joining a support group for people who want to quit smoking in your local community.  Where to find more information: You may find more information about quitting smoking from:  HelpGuide.org: www.helpguide.org/articles/addictions/how-to-quit-smoking.htm  https://hall.com/: smokefree.gov  American Lung Association: www.lung.org  Contact a health care provider if:  You have problems breathing.  Your lips, nose, or fingers turn blue.  You have chest pain.  You are coughing up blood.  You feel faint or you pass out.  You have other noticeable changes that cause you to worry. Summary  Smoking tobacco can negatively affect your health, the health of those around you, your finances, and your social life.  Do not start smoking. Quit if you already do. If you need help quitting, ask your health care provider.  Think about joining a support group for people who want to quit smoking in your local community. There are many effective programs that will help you to quit this behavior. This information is not intended to replace advice given to you by your health care provider. Make sure you discuss any questions you have with your health care provider. Document Released: 08/01/2016 Document Revised: 08/01/2016 Document Reviewed: 08/01/2016 Elsevier Interactive Patient Education  2018 [employer] 65 Years and Older, Female Preventive care refers to lifestyle choices and visits with your health care provider that can promote health and wellness. What does preventive care include?  A yearly physical exam. This is also called an annual well check.  Dental exams once or twice a year.  Routine eye exams. Ask your health care provider how often you should have your eyes checked.  Personal lifestyle choices, including: ? Daily care of your teeth and gums. ? Regular physical activity. ? Eating a healthy diet. ? Avoiding tobacco and drug use. ? Limiting alcohol use. ? Practicing safe sex. ? Taking low-dose aspirin every day. ? Taking vitamin and mineral supplements as recommended by your health care provider. What happens during an annual well check? The services and screenings done by your health care provider during your annual well check will depend on your age, overall health, lifestyle risk factors, and family history of disease. Counseling Your health care provider may ask you questions about your:  Alcohol use.  Tobacco use.  Drug use.  Emotional well-being.  Home and relationship well-being.  Sexual activity.  Eating habits.  History of falls.  Memory and ability to understand (cognition).  Work and work Statistician.  Reproductive health.  Screening You may have the following tests or measurements:  Height, weight, and BMI.  Blood pressure.  Lipid and cholesterol levels. These may be checked every 5 years, or more frequently  if you are over 34 years old.  Skin check.  Lung cancer screening. You may have this screening every year starting at age 65 if you have a 30-pack-year history of smoking and currently smoke or have quit within the past 15 years.  Fecal occult blood test (FOBT) of the stool. You may have this test every year starting at age 76.  Flexible sigmoidoscopy or colonoscopy. You may have a sigmoidoscopy every 5 years or a  colonoscopy every 10 years starting at age 1.  Hepatitis C blood test.  Hepatitis B blood test.  Sexually transmitted disease (STD) testing.  Diabetes screening. This is done by checking your blood sugar (glucose) after you have not eaten for a while (fasting). You may have this done every 1-3 years.  Bone density scan. This is done to screen for osteoporosis. You may have this done starting at age 62.  Mammogram. This may be done every 1-2 years. Talk to your health care provider about how often you should have regular mammograms.  Talk with your health care provider about your test results, treatment options, and if necessary, the need for more tests. Vaccines Your health care provider may recommend certain vaccines, such as:  Influenza vaccine. This is recommended every year.  Tetanus, diphtheria, and acellular pertussis (Tdap, Td) vaccine. You may need a Td booster every 10 years.  Varicella vaccine. You may need this if you have not been vaccinated.  Zoster vaccine. You may need this after age 46.  Measles, mumps, and rubella (MMR) vaccine. You may need at least one dose of MMR if you were born in 1957 or later. You may also need a second dose.  Pneumococcal 13-valent conjugate (PCV13) vaccine. One dose is recommended after age 69.  Pneumococcal polysaccharide (PPSV23) vaccine. One dose is recommended after age 62.  Meningococcal vaccine. You may need this if you have certain conditions.  Hepatitis A vaccine. You may need this if you have certain conditions or if you travel or work in places where you may be exposed to hepatitis A.  Hepatitis B vaccine. You may need this if you have certain conditions or if you travel or work in places where you may be exposed to hepatitis B.  Haemophilus influenzae type b (Hib) vaccine. You may need this if you have certain conditions.  Talk to your health care provider about which screenings and vaccines you need and how often you need  them. This information is not intended to replace advice given to you by your health care provider. Make sure you discuss any questions you have with your health care provider. Document Released: 08/13/2015 Document Revised: 04/05/2016 Document Reviewed: 05/18/2015 Elsevier Interactive Patient Education  [employer].

## 2017-12-13 NOTE — Progress Notes (Signed)
Subjective:     Leah [name] is a 72 y.o. female and is here for a comprehensive physical exam. The patient reports no problems.  Social History   Socioeconomic History  . Marital status: Married    Spouse name: Not on file  . Number of children: Not on file  . Years of education: Not on file  . Highest education level: Not on file  Occupational History  . Not on file  Social Needs  . Financial resource strain: Not on file  . Food insecurity:    Worry: Not on file    Inability: Not on file  . Transportation needs:    Medical: Not on file    Non-medical: Not on file  Tobacco Use  . Smoking status: Current Every Day Smoker    Packs/day: 1.00    Years: 47.00    Pack years: 47.00    Types: Cigarettes  . Smokeless tobacco: Never Used  . Tobacco comment: form given 02/17/16  Substance and Sexual Activity  . Alcohol use: No    Alcohol/week: 0.0 oz  . Drug use: No  . Sexual activity: Not Currently    Birth control/protection: Post-menopausal  Lifestyle  . Physical activity:    Days per week: Not on file    Minutes per session: Not on file  . Stress: Not on file  Relationships  . Social connections:    Talks on phone: Not on file    Gets together: Not on file    Attends religious service: Not on file    Active member of club or organization: Not on file    Attends meetings of clubs or organizations: Not on file    Relationship status: Not on file  . Intimate partner violence:    Fear of current or ex partner: Not on file    Emotionally abused: Not on file    Physically abused: Not on file    Forced sexual activity: Not on file  Other Topics Concern  . Not on file  Social History Narrative  . Not on file   Health Maintenance  Topic Date Due  . PNA vac Low Risk Adult (2 of 2 - PPSV23) 06/20/2014  . INFLUENZA VACCINE  02/28/2018  . COLONOSCOPY  04/30/2018  . MAMMOGRAM  08/01/2018  . TETANUS/TDAP  02/23/2019  . DEXA SCAN  Completed  . Hepatitis C Screening   Completed    The following portions of the patient's history were reviewed and updated as appropriate:  She  has a past medical history of Anxiety, Barrett esophagus, Bladder pain, Chronic bronchitis ([place]), Complication of anesthesia, Depression, Diverticulosis, Frequency of urination, GERD (gastroesophageal reflux disease), Hiatal hernia, History of adenomatous polyp of colon, History of chronic gastritis, History of diverticulitis of colon, History of esophageal dilatation, History of esophageal spasm, Hyperlipidemia, Hypertension, IBS (irritable bowel syndrome), Interstitial cystitis, Productive cough, Smokers' cough ([place]), and Urgency of urination. She does not have any pertinent problems on file. She  has a past surgical history that includes Carpal tunnel release (Right, 1993); Sinus surgery with [employer] (1991); laparoscopic appendectomy (N/A, 09/21/2013); Anterior fusion cervical spine (09/03/2014); Anterior cervical decomp/discectomy fusion (N/A, 09/07/2014); Tonsillectomy and adenoidectomy (1964); Laparoscopic cholecystectomy (2002); Abdominal hysterectomy (1975); Temporomandibular joint surgery (1990); LAPAROSCOPY SIGMOID COLECTOMY (10-05-2008); Microlaryngoscopy (09-27-2006); Colonoscopy (last one 04-30-2013); Esophagogastroduodenoscopy (last one 09-11-2012); CYSTO/  HOD/  BLADDER BX (1990's); cysto with hydrodistension (N/A, 06/08/2015); Colon surgery; and Appendectomy. Her family history includes Colon polyps in her mother; Diabetes in her father;  Heart disease in her father; Hypertension in her father; Leukemia in her father; Pancreatic cancer in her father and mother; Prostate cancer in her father. She  reports that she has been smoking cigarettes.  She has a 47.00 pack-year smoking history. She has never used smokeless tobacco. She reports that she does not drink alcohol or use drugs. She has a current medication list which includes the following prescription(s): albuterol, aspirin ec,  atorvastatin, azelastine, cyclobenzaprine, esomeprazole, estradiol, ezetimibe, fluticasone, fluticasone-salmeterol, ibuprofen, lisinopril, lorazepam, polyethylene glycol, senna-docusate, sertraline, sodium chloride, tiotropium, tizanidine, and triamcinolone. Current Outpatient Medications on File Prior to Visit  Medication Sig Dispense Refill  . albuterol (PROVENTIL) (2.5 MG/3ML) 0.083% nebulizer solution Inhale 3 mLs (2.5 mg total) into the lungs every 4 (four) hours as needed for wheezing or shortness of breath. q 4-6 hours as needed 75 mL 0  . aspirin EC 81 MG tablet Take 1 tablet (81 mg total) by mouth daily. Over the counter    . atorvastatin (LIPITOR) 80 MG tablet Take 1 tablet (80 mg total) by mouth daily. 90 tablet 1  . azelastine (ASTELIN) 0.1 % nasal spray Place 2 sprays into both nostrils 2 (two) times daily. Use in each nostril as directed 30 mL 3  . cyclobenzaprine (FLEXERIL) 5 MG tablet Take 1 tablet (5 mg total) by mouth 3 (three) times daily. 90 tablet 1  . esomeprazole (NEXIUM) 40 MG capsule Take 1 capsule (40 mg total) by mouth 2 (two) times daily before a meal. 180 capsule 1  . estradiol (CLIMARA - DOSED IN MG/24 HR) 0.1 mg/24hr patch Place 0.1 mg onto the skin every Monday.  0  . ezetimibe (ZETIA) 10 MG tablet Take 1 tablet (10 mg total) by mouth daily. 90 tablet 1  . fluticasone (FLONASE) 50 MCG/ACT nasal spray Place 2 sprays into both nostrils daily. 16 g 6  . Fluticasone-Salmeterol (ADVAIR DISKUS) 250-50 MCG/DOSE AEPB Inhale 1 puff into the lungs 2 (two) times daily. 1 each 5  . ibuprofen (ADVIL,MOTRIN) 200 MG tablet Take 400 mg by mouth every 6 (six) hours as needed (pain).    . LORazepam (ATIVAN) 0.5 MG tablet Take 0.5 mg by mouth at bedtime.   5  . polyethylene glycol (MIRALAX / GLYCOLAX) packet Take 17 g by mouth daily as needed for moderate constipation. 14 each 0  . senna-docusate (SENOKOT-S) 8.6-50 MG tablet Take 1 tablet by mouth at bedtime. 30 tablet 0  . sertraline  (ZOLOFT) 50 MG tablet Take 50 mg by mouth at bedtime.    . sodium chloride (OCEAN) 0.65 % SOLN nasal spray Place 1 spray into both nostrils daily as needed for congestion.    Marland Kitchen tiotropium (SPIRIVA HANDIHALER) 18 MCG inhalation capsule Place 1 capsule (18 mcg total) into inhaler and inhale daily. 90 capsule 1  . tiZANidine (ZANAFLEX) 4 MG tablet Take 1 tablet (4 mg total) by mouth 3 (three) times daily as needed for muscle spasms. 30 tablet 0  . triamcinolone (NASACORT) 55 MCG/ACT AERO nasal inhaler Place 2 sprays into the nose daily. 1 Inhaler 12   No current facility-administered medications on file prior to visit.    She is allergic to amoxicillin-pot clavulanate; avelox [moxifloxacin hcl in nacl]; ciprofloxacin; keflex [cephalexin]; moxifloxacin; penicillins; sulfa antibiotics; prednisone; and acyclovir and related..  Review of Systems Review of Systems  Constitutional: Negative for activity change, appetite change and fatigue.  HENT: Negative for hearing loss, congestion, tinnitus and ear discharge.  dentist [id number]: Negative for visual disturbance (see  optho q1y -- vision corrected to 20/20 with glasses).  Respiratory: Negative for cough, chest tightness and shortness of breath.   Cardiovascular: Negative for chest pain, palpitations and leg swelling.  Gastrointestinal: Negative for abdominal pain, diarrhea, constipation and abdominal distention.  Genitourinary: Negative for urgency, frequency, decreased urine volume and difficulty urinating.  Musculoskeletal: Negative for back pain, arthralgias and gait problem.  Skin: Negative for color change, pallor and rash.  Neurological: Negative for dizziness, light-headedness, numbness and headaches.  Hematological: Negative for adenopathy. Does not bruise/bleed easily.  Psychiatric/Behavioral: Negative for suicidal ideas, confusion, sleep disturbance, self-injury, dysphoric mood, decreased concentration and agitation.       Objective:     BP 132/66 (BP Location: Right Arm, Cuff Size: Normal)   Pulse 62   Temp 98.1 F (36.7 C) (Oral)   Resp 16   Ht 5' 1"  (1.549 m)   Wt 118 lb 9.6 oz (53.8 kg)   SpO2 93%   BMI 22.41 kg/m  General appearance: alert, cooperative, appears stated age and no distress Head: Normocephalic, without obvious abnormality, atraumatic Eyes: conjunctivae/corneas clear. PERRL, EOM's intact. Fundi benign. Ears: normal TM's and external ear canals both ears Nose: Nares normal. Septum midline. Mucosa normal. No drainage or sinus tenderness. Throat: lips, mucosa, and tongue normal; teeth and gums normal Neck: no adenopathy, no carotid bruit, no JVD, supple, symmetrical, trachea midline and thyroid not enlarged, symmetric, no tenderness/mass/nodules Back: symmetric, no curvature. ROM normal. No CVA tenderness. Lungs: clear to auscultation bilaterally Breasts: gyn Heart: regular rate and rhythm, S1, S2 normal, no murmur, click, rub or gallop Abdomen: soft, non-tender; bowel sounds normal; no masses,  no organomegaly Pelvic: deferred--gyn Extremities: extremities normal, atraumatic, no cyanosis or edema Pulses: 2+ and symmetric Skin: Skin color, texture, turgor normal. No rashes or lesions Lymph nodes: Cervical, supraclavicular, and axillary nodes normal. Neurologic: Alert and oriented X 3, normal strength and tone. Normal symmetric reflexes. Normal coordination and gait    Assessment:    Healthy female exam.     Plan:    [facility] UTD CHECK LABS See After Visit Summary for Counseling Recommendations   . 1. Hyperlipidemia LDL goal <100 Tolerating statin, encouraged heart healthy diet, avoid trans fats, minimize simple carbs and saturated fats. Increase exercise as tolerated - Lipid panel; Future  2. Essential hypertension Well controlled, no changes to meds. Encouraged heart healthy diet such as the DASH diet and exercise as tolerated.  - lisinopril (PRINIVIL,ZESTRIL) 20 MG tablet; Take 1 tablet  (20 mg total) by mouth daily.  Dispense: 90 tablet; Refill: 1 - Comp Met (CMET); Future - Lipid panel; Future - CBC w/Diff; Future  3. Pulmonary emphysema, unspecified emphysema type ([place]) con't meds per pulm  4. Vision test   - Ambulatory referral to Ophthalmology  5. Smoking   - Ambulatory Referral for Lung Cancer Scre  6. Preventative health care See above - Comp Met (CMET); Future - Lipid panel; Future - CBC w/Diff; Future

## 2017-12-14 ENCOUNTER — Ambulatory Visit: Payer: Medicare HMO | Admitting: Gastroenterology

## 2017-12-17 ENCOUNTER — Encounter

## 2017-12-17 ENCOUNTER — Encounter: Payer: Self-pay | Admitting: Gastroenterology

## 2017-12-17 ENCOUNTER — Ambulatory Visit: Payer: Medicare HMO | Admitting: Gastroenterology

## 2017-12-17 VITALS — BP 112/60 | HR 80 | Ht 61.0 in | Wt 116.0 lb

## 2017-12-17 DIAGNOSIS — R1032 Left lower quadrant pain: Secondary | ICD-10-CM | POA: Diagnosis not present

## 2017-12-17 DIAGNOSIS — R194 Change in bowel habit: Secondary | ICD-10-CM

## 2017-12-17 MED ORDER — DICYCLOMINE HCL 10 MG PO CAPS: 10.0000 mg | ORAL_CAPSULE | ORAL | 0 refills | Status: DC | PRN

## 2017-12-17 NOTE — Patient Instructions (Signed)
If you are age 70 or older, your body mass index should be between 23-30. Your Body mass index is 21.92 kg/m. If this is out of the aforementioned range listed, please consider follow up with your Primary Care Provider.  If you are age 5 or younger, your body mass index should be between 19-25. Your Body mass index is 21.92 kg/m. If this is out of the aformentioned range listed, please consider follow up with your Primary Care Provider.   It has been recommended to you by your physician that you have a(n) Colonoscopy completed. Per your request, we did not schedule the procedure(s) today. Please contact our office at [phone] should you decide to have the procedure completed.  It was a pleasure to meet you today!  Dr. Loletha Carrow

## 2017-12-17 NOTE — Progress Notes (Signed)
[facility] Progress Note  Chief Complaint: Left lower quadrant pain  Subjective  History:  This is a 72 year old woman last seen by me in July 2017 for long-standing left lower quadrant pain with irritable bowel syndrome.  She has constipation, which she may go for a few days without a BM, then she will have stool that is either formed or loose.  There is been no bleeding, but at times she feels as if something may be bulging from the rectum.  She was seen in the emergency department for this at one point and believes that she was told the problem was from scar tissue. When I last saw her, we tried MiraLAX, but she says this did not help with the constipation.  The only thing that seems to help so far is Senokot 2 tablets which will cause a bowel movement but the following day but will sometimes be loose.  She has taken dicyclomine sparingly when she gets left lower quadrant pain typically occurs after bowel movement.  ROS: Cardiovascular:  no chest pain Respiratory: no dyspnea.  She has chronic nonproductive cough Back pain and arthralgias Seasonal allergies Remainder of systems negative except as above  The patient's Past Medical, Family and Social History were reviewed and are on file in the EMR. She continues to smoke Objective:  Med list reviewed  Current Outpatient Medications:  .  albuterol (PROVENTIL) (2.5 MG/3ML) 0.083% nebulizer solution, Inhale 3 mLs (2.5 mg total) into the lungs every 4 (four) hours as needed for wheezing or shortness of breath. q 4-6 hours as needed, Disp: 75 mL, Rfl: 0 .  aspirin EC 81 MG tablet, Take 1 tablet (81 mg total) by mouth daily. Over the counter, Disp: , Rfl:  .  atorvastatin (LIPITOR) 80 MG tablet, Take 1 tablet (80 mg total) by mouth daily., Disp: 90 tablet, Rfl: 1 .  azelastine (ASTELIN) 0.1 % nasal spray, Place 2 sprays into both nostrils 2 (two) times daily. Use in each nostril as directed, Disp: 30 mL, Rfl: 3 .  cyclobenzaprine  (FLEXERIL) 5 MG tablet, Take 1 tablet (5 mg total) by mouth 3 (three) times daily., Disp: 90 tablet, Rfl: 1 .  esomeprazole (NEXIUM) 40 MG capsule, Take 1 capsule (40 mg total) by mouth 2 (two) times daily before a meal., Disp: 180 capsule, Rfl: 1 .  estradiol (CLIMARA - DOSED IN MG/24 HR) 0.1 mg/24hr patch, Place 0.1 mg onto the skin every Monday., Disp: , Rfl: 0 .  ezetimibe (ZETIA) 10 MG tablet, Take 1 tablet (10 mg total) by mouth daily., Disp: 90 tablet, Rfl: 1 .  fluticasone (FLONASE) 50 MCG/ACT nasal spray, Place 2 sprays into both nostrils daily., Disp: 16 g, Rfl: 6 .  Fluticasone-Salmeterol (ADVAIR DISKUS) 250-50 MCG/DOSE AEPB, Inhale 1 puff into the lungs 2 (two) times daily., Disp: 1 each, Rfl: 5 .  ibuprofen (ADVIL,MOTRIN) 200 MG tablet, Take 400 mg by mouth every 6 (six) hours as needed (pain)., Disp: , Rfl:  .  lisinopril (PRINIVIL,ZESTRIL) 20 MG tablet, Take 1 tablet (20 mg total) by mouth daily., Disp: 90 tablet, Rfl: 1 .  LORazepam (ATIVAN) 0.5 MG tablet, Take 0.5 mg by mouth at bedtime. , Disp: , Rfl: 5 .  polyethylene glycol (MIRALAX / GLYCOLAX) packet, Take 17 g by mouth daily as needed for moderate constipation., Disp: 14 each, Rfl: 0 .  senna-docusate (SENOKOT-S) 8.6-50 MG tablet, Take 1 tablet by mouth at bedtime., Disp: 30 tablet, Rfl: 0 .  sertraline (  ZOLOFT) 50 MG tablet, Take 50 mg by mouth at bedtime., Disp: , Rfl:  .  sodium chloride (OCEAN) 0.65 % SOLN nasal spray, Place 1 spray into both nostrils daily as needed for congestion., Disp: , Rfl:  .  tiotropium (SPIRIVA HANDIHALER) 18 MCG inhalation capsule, Place 1 capsule (18 mcg total) into inhaler and inhale daily., Disp: 90 capsule, Rfl: 1 .  tiZANidine (ZANAFLEX) 4 MG tablet, Take 1 tablet (4 mg total) by mouth 3 (three) times daily as needed for muscle spasms., Disp: 30 tablet, Rfl: 0 .  triamcinolone (NASACORT) 55 MCG/ACT AERO nasal inhaler, Place 2 sprays into the nose daily., Disp: 1 Inhaler, Rfl: 12   Vital  signs in last 24 hrs: Vitals:   12/17/17 1407  BP: 112/60  Pulse: 80    Physical Exam  She is not acutely ill-appearing, smells of cigarettes  HEENT: sclera anicteric, oral mucosa moist without lesions  Neck: supple, no thyromegaly, JVD or lymphadenopathy  Cardiac: RRR without murmurs, S1S2 heard, no peripheral edema  Pulm: clear to auscultation bilaterally, normal RR and effort noted  Abdomen: soft, LLQ tenderness, with active bowel sounds. No guarding or palpable hepatosplenomegaly.  Skin; warm and dry, no jaundice or rash Rectal: Normal external, 70 decreased resting sphincter tone and voluntary sphincter tone, normal puborectalis.  No palpable internal lesions.  No abdominal wall contraction or rectal descent felt with Valsalva.   @ASSESSMENTPLANBEGIN @ Assessment: Encounter Diagnoses  Name Primary?  Marland Kitchen LLQ abdominal pain Yes  . Altered bowel habits     Long-standing symptoms that seem likely related to prior rectosigmoid resection from diverticulitis affecting motility as well as IBS.  Plan: Occasional use of dicyclomine, cautioned against daily use as it may worsen constipation.  Prescribed. Colonoscopy due to reported prolapse of tissue, unclear rectal prolapse or just hemorrhoidal tissue.  This might be occurring because of constipation and straining related to motility issues.  Difficult to tell on exam, but might have dyssynergia defecation. She is agreeable to colonoscopy, but decided to put off scheduling until after her daughter's upcoming surgery.  She said she would contact us when she was ready.   Total time 30 minutes, over half spent face-to-face with patient in counseling and coordination of care.   Nelida Meuse III

## 2017-12-17 NOTE — Addendum Note (Signed)
Addended by: Nelida Meuse on: 12/17/2017 03:16 PM   Modules accepted: Orders

## 2017-12-18 ENCOUNTER — Other Ambulatory Visit (INDEPENDENT_AMBULATORY_CARE_PROVIDER_SITE_OTHER): Payer: Medicare HMO

## 2017-12-18 DIAGNOSIS — R739 Hyperglycemia, unspecified: Secondary | ICD-10-CM

## 2017-12-18 DIAGNOSIS — Z0000 Encounter for general adult medical examination without abnormal findings: Secondary | ICD-10-CM

## 2017-12-18 DIAGNOSIS — E785 Hyperlipidemia, unspecified: Secondary | ICD-10-CM

## 2017-12-18 DIAGNOSIS — I10 Essential (primary) hypertension: Secondary | ICD-10-CM | POA: Diagnosis not present

## 2017-12-18 LAB — CBC WITH DIFFERENTIAL/PLATELET
BASOS ABS: 0.1 10*3/uL (ref 0.0–0.1)
Basophils Relative: 1.2 % (ref 0.0–3.0)
EOS ABS: 0.4 10*3/uL (ref 0.0–0.7)
EOS PCT: 6.8 % — AB (ref 0.0–5.0)
HCT: 40 % (ref 36.0–46.0)
HEMOGLOBIN: 13.5 g/dL (ref 12.0–15.0)
LYMPHS ABS: 1.6 10*3/uL (ref 0.7–4.0)
Lymphocytes Relative: 28 % (ref 12.0–46.0)
MCHC: 33.7 g/dL (ref 30.0–36.0)
MCV: 89.4 fl (ref 78.0–100.0)
MONO ABS: 0.4 10*3/uL (ref 0.1–1.0)
Monocytes Relative: 6.8 % (ref 3.0–12.0)
NEUTROS PCT: 57.2 % (ref 43.0–77.0)
Neutro Abs: 3.3 10*3/uL (ref 1.4–7.7)
Platelets: 212 10*3/uL (ref 150.0–400.0)
RBC: 4.48 Mil/uL (ref 3.87–5.11)
RDW: 14 % (ref 11.5–15.5)
WBC: 5.8 10*3/uL (ref 4.0–10.5)

## 2017-12-18 LAB — COMPREHENSIVE METABOLIC PANEL
ALBUMIN: 4 g/dL (ref 3.5–5.2)
ALK PHOS: 75 U/L (ref 39–117)
ALT: 8 U/L (ref 0–35)
AST: 9 U/L (ref 0–37)
BUN: 13 mg/dL (ref 6–23)
CO2: 27 mEq/L (ref 19–32)
Calcium: 8.9 mg/dL (ref 8.4–10.5)
Chloride: 103 mEq/L (ref 96–112)
Creatinine, Ser: 0.82 mg/dL (ref 0.40–1.20)
GFR: 73.35 mL/min (ref >60.00)
Glucose, Bld: 99 mg/dL (ref 70–99)
POTASSIUM: 3.9 meq/L (ref 3.5–5.1)
SODIUM: 138 meq/L (ref 135–145)
TOTAL PROTEIN: 6.4 g/dL (ref 6.0–8.3)
Total Bilirubin: 0.3 mg/dL (ref 0.2–1.2)

## 2017-12-18 LAB — LIPID PANEL
Cholesterol: 165 mg/dL (ref 0–200)
HDL: 35.3 mg/dL — AB (ref >39.00)
LDL CALC: 107 mg/dL — AB (ref 0–99)
NonHDL: 129.7
TRIGLYCERIDES: 112 mg/dL (ref 0.0–149.0)
Total CHOL/HDL Ratio: 5
VLDL: 22.4 mg/dL (ref 0.0–40.0)

## 2017-12-18 LAB — HEMOGLOBIN A1C: HEMOGLOBIN A1C: 6.1 % (ref 4.6–6.5)

## 2017-12-19 ENCOUNTER — Other Ambulatory Visit: Payer: Self-pay | Admitting: Family Medicine

## 2017-12-19 DIAGNOSIS — I10 Essential (primary) hypertension: Secondary | ICD-10-CM

## 2017-12-19 DIAGNOSIS — E785 Hyperlipidemia, unspecified: Secondary | ICD-10-CM

## 2017-12-19 MED ORDER — EZETIMIBE 10 MG PO TABS: 10.0000 mg | ORAL_TABLET | Freq: Every day | ORAL | 1 refills | Status: DC

## 2017-12-20 ENCOUNTER — Encounter: Payer: Self-pay | Admitting: Medical

## 2017-12-20 ENCOUNTER — Ambulatory Visit (INDEPENDENT_AMBULATORY_CARE_PROVIDER_SITE_OTHER): Payer: Medicare HMO | Admitting: Medical

## 2017-12-20 VITALS — BP 138/66 | HR 68 | Temp 98.0°F | Resp 16 | Ht 61.0 in | Wt 117.6 lb

## 2017-12-20 DIAGNOSIS — R062 Wheezing: Secondary | ICD-10-CM

## 2017-12-20 DIAGNOSIS — R059 Cough, unspecified: Secondary | ICD-10-CM

## 2017-12-20 DIAGNOSIS — R05 Cough: Secondary | ICD-10-CM | POA: Diagnosis not present

## 2017-12-20 DIAGNOSIS — J40 Bronchitis, not specified as acute or chronic: Secondary | ICD-10-CM

## 2017-12-20 MED ORDER — FLUTICASONE-SALMETEROL 250-50 MCG/DOSE IN AEPB: 1.0000 | INHALATION_SPRAY | Freq: Two times a day (BID) | RESPIRATORY_TRACT | 5 refills | Status: DC

## 2017-12-20 MED ORDER — BENZONATATE 100 MG PO CAPS: 100.0000 mg | ORAL_CAPSULE | Freq: Three times a day (TID) | ORAL | 0 refills | Status: DC | PRN

## 2017-12-20 MED ORDER — DOXYCYCLINE HYCLATE 100 MG PO TABS: 100.0000 mg | ORAL_TABLET | Freq: Two times a day (BID) | ORAL | 0 refills | Status: DC

## 2017-12-20 MED ORDER — METHYLPREDNISOLONE ACETATE 40 MG/ML IJ SUSP
40.0000 mg | Freq: Once | INTRAMUSCULAR | Status: AC
  Administered 2017-12-20: 40 mg via INTRAMUSCULAR

## 2017-12-20 NOTE — Patient Instructions (Addendum)
You do have bronchitis type symptoms recently.  With some wheezing as well.  I am prescribing you doxycycline antibiotic.  For cough I am prescribing benzonatate.  Refill your Advair inhaler.  Continue with Spiriva and you have neb machine/albuterol to use as needed.  Initially I was going to provide you with taper prednisone prescription to use if you wheezing were to worsen over the weekend.  But you remind me you cannot tolerate oral prednisone.  You have done well with Depo-Medrol injection in our office.  So in light o  upcoming long 3-day weekend we gave you Depo-Medrol 40 mg IM.  If symptoms worsen or persist then recommend chest x-ray.  Follow-up in 5 days or as needed.

## 2017-12-20 NOTE — Progress Notes (Signed)
Subjective:    Patient ID: Leah [name], female    DOB: [birth year], 72 y.o.   MRN: [id number]  HPI  Pt in stating she just got some chest congestion for 3 days. She states beginning to hear herself wheezing. Pt states slight coughing up mucus. Yellow tinge to mucus.  No fever and no chills. Recently more sweating all the sudden.  Cough not severe.  Pt states she ran out of Advair weeks ago.   She still has neb solution.  Pt wheezing at night.     Review of Systems  Constitutional: Negative for chills, fatigue and fever.  HENT: Negative for congestion, ear pain and facial swelling.   Respiratory: Positive for cough and wheezing. Negative for chest tightness and shortness of breath.   Cardiovascular: Negative for chest pain and palpitations.  Gastrointestinal: Negative for abdominal pain.  Musculoskeletal: Negative for back pain.  Hematological: Negative for adenopathy. Does not bruise/bleed easily.  Psychiatric/Behavioral: Negative for behavioral problems and confusion.    Past Medical History:  Diagnosis Date  . Anxiety   . Barrett esophagus   . Bladder pain   . Chronic bronchitis ([place])    per pt last episode july 2016  . Complication of anesthesia    slow to wake  . Depression   . Diverticulosis   . Frequency of urination   . GERD (gastroesophageal reflux disease)   . Hiatal hernia   . History of adenomatous polyp of colon    tubular adenoma  . History of chronic gastritis   . History of diverticulitis of colon   . History of esophageal dilatation    for stricture  . History of esophageal spasm   . Hyperlipidemia   . Hypertension   . IBS (irritable bowel syndrome)   . Interstitial cystitis   . Productive cough    intermittant  . Smokers' cough ([place])   . Urgency of urination      Social History   Socioeconomic History  . Marital status: Married    Spouse name: Not on file  . Number of children: Not on file  . Years of education: Not on file    . Highest education level: Not on file  Occupational History  . Not on file  Social Needs  . Financial resource strain: Not on file  . Food insecurity:    Worry: Not on file    Inability: Not on file  . Transportation needs:    Medical: Not on file    Non-medical: Not on file  Tobacco Use  . Smoking status: Current Every Day Smoker    Packs/day: 1.00    Years: 47.00    Pack years: 47.00    Types: Cigarettes  . Smokeless tobacco: Never Used  . Tobacco comment: form given 02/17/16  Substance and Sexual Activity  . Alcohol use: No    Alcohol/week: 0.0 oz  . Drug use: No  . Sexual activity: Not Currently    Birth control/protection: Post-menopausal  Lifestyle  . Physical activity:    Days per week: Not on file    Minutes per session: Not on file  . Stress: Not on file  Relationships  . Social connections:    Talks on phone: Not on file    Gets together: Not on file    Attends religious service: Not on file    Active member of club or organization: Not on file    Attends meetings of clubs or organizations: Not on file  Relationship status: Not on file  . Intimate partner violence:    Fear of current or ex partner: Not on file    Emotionally abused: Not on file    Physically abused: Not on file    Forced sexual activity: Not on file  Other Topics Concern  . Not on file  Social History Narrative  . Not on file    Past Surgical History:  Procedure Laterality Date  . ABDOMINAL HYSTERECTOMY  1975  . ANTERIOR CERVICAL DECOMP/DISCECTOMY FUSION N/A 09/07/2014   Procedure: Exploration of Fusion and Removal of Anterior Cervical Hematoma;  Surgeon: Newman Pies, MD;  Location: [facility] NEURO ORS;  Service: Neurosurgery;  Laterality: N/A;  . ANTERIOR FUSION CERVICAL SPINE  09/03/2014   C5 -- C7  . APPENDECTOMY    . CARPAL TUNNEL RELEASE Right 1993  . COLON SURGERY    . COLONOSCOPY  last one 04-30-2013  . CYSTO WITH HYDRODISTENSION N/A 06/08/2015   Procedure:  CYSTOSCOPY/HYDRODISTENSION INSTILLATION OF MARCAINE AND PYRIDIUM;  Surgeon: Irine Seal, MD;  Location: [facility];  Service: Urology;  Laterality: N/A;  . CYSTO/  HOD/  BLADDER BX  1990's  . ESOPHAGOGASTRODUODENOSCOPY  last one 09-11-2012  . LAPAROSCOPIC APPENDECTOMY N/A 09/21/2013   Procedure: APPENDECTOMY LAPAROSCOPIC;  Surgeon: Rolm Bookbinder, MD;  Location: [facility];  Service: General;  Laterality: N/A;  . LAPAROSCOPIC CHOLECYSTECTOMY  2002  . LAPAROSCOPY SIGMOID COLECTOMY  10-05-2008   diverticulitis  . MICROLARYNGOSCOPY  09-27-2006   w/ true vocal cord stripping  and bilateral bx's of lesion's (benign)  . SINUS SURGERY WITH [facility]  . [place]  . TONSILLECTOMY AND ADENOIDECTOMY  1964    Family History  Problem Relation Age of Onset  . Pancreatic cancer Mother   . Colon polyps Mother   . Pancreatic cancer Father   . Diabetes Father   . Heart disease Father   . Leukemia Father   . Prostate cancer Father   . Hypertension Father   . Colon cancer Neg Hx     Allergies  Allergen Reactions  . Amoxicillin-Pot Clavulanate Shortness Of Breath, Itching and Swelling  . Avelox [Moxifloxacin Hcl In Nacl] Swelling    Swelling, itching and shortness of breath  . Ciprofloxacin Shortness Of Breath, Itching and Swelling  . Keflex [Cephalexin] Shortness Of Breath and Swelling  . Moxifloxacin Shortness Of Breath, Itching and Swelling  . Penicillins Shortness Of Breath, Itching and Swelling  . Sulfa Antibiotics Shortness Of Breath, Itching and Swelling  . Prednisone Other (See Comments)    GI irritation; is able to tolerate injections  . Acyclovir And Related Other (See Comments)    Pt does not recall this reaction    Current Outpatient Medications on File Prior to Visit  Medication Sig Dispense Refill  . albuterol (PROVENTIL) (2.5 MG/3ML) 0.083% nebulizer solution Inhale 3 mLs (2.5 mg total) into the lungs every 4 (four) hours as  needed for wheezing or shortness of breath. q 4-6 hours as needed 75 mL 0  . aspirin EC 81 MG tablet Take 1 tablet (81 mg total) by mouth daily. Over the counter    . atorvastatin (LIPITOR) 80 MG tablet Take 1 tablet (80 mg total) by mouth daily. 90 tablet 1  . azelastine (ASTELIN) 0.1 % nasal spray Place 2 sprays into both nostrils 2 (two) times daily. Use in each nostril as directed 30 mL 3  . cyclobenzaprine (FLEXERIL) 5 MG tablet Take 1 tablet (5 mg total)  by mouth 3 (three) times daily. 90 tablet 1  . dicyclomine (BENTYL) 10 MG capsule Take 1 capsule (10 mg total) by mouth as needed for up to 1 dose for spasms. 60 capsule 0  . esomeprazole (NEXIUM) 40 MG capsule Take 1 capsule (40 mg total) by mouth 2 (two) times daily before a meal. 180 capsule 1  . estradiol (CLIMARA - DOSED IN MG/24 HR) 0.1 mg/24hr patch Place 0.1 mg onto the skin every Monday.  0  . ezetimibe (ZETIA) 10 MG tablet Take 1 tablet (10 mg total) by mouth daily. 90 tablet 1  . fluticasone (FLONASE) 50 MCG/ACT nasal spray Place 2 sprays into both nostrils daily. 16 g 6  . Fluticasone-Salmeterol (ADVAIR DISKUS) 250-50 MCG/DOSE AEPB Inhale 1 puff into the lungs 2 (two) times daily. 1 each 5  . ibuprofen (ADVIL,MOTRIN) 200 MG tablet Take 400 mg by mouth every 6 (six) hours as needed (pain).    Marland Kitchen lisinopril (PRINIVIL,ZESTRIL) 20 MG tablet Take 1 tablet (20 mg total) by mouth daily. 90 tablet 1  . LORazepam (ATIVAN) 0.5 MG tablet Take 0.5 mg by mouth at bedtime.   5  . polyethylene glycol (MIRALAX / GLYCOLAX) packet Take 17 g by mouth daily as needed for moderate constipation. 14 each 0  . senna-docusate (SENOKOT-S) 8.6-50 MG tablet Take 1 tablet by mouth at bedtime. 30 tablet 0  . sertraline (ZOLOFT) 50 MG tablet Take 50 mg by mouth at bedtime.    . sodium chloride (OCEAN) 0.65 % SOLN nasal spray Place 1 spray into both nostrils daily as needed for congestion.    Marland Kitchen tiotropium (SPIRIVA HANDIHALER) 18 MCG inhalation capsule Place 1  capsule (18 mcg total) into inhaler and inhale daily. 90 capsule 1  . tiZANidine (ZANAFLEX) 4 MG tablet Take 1 tablet (4 mg total) by mouth 3 (three) times daily as needed for muscle spasms. 30 tablet 0  . triamcinolone (NASACORT) 55 MCG/ACT AERO nasal inhaler Place 2 sprays into the nose daily. 1 Inhaler 12   No current facility-administered medications on file prior to visit.     BP 138/66   Pulse 68   Temp 98 F (36.7 C) (Oral)   Resp 16   Ht 5\' 1"  (1.549 m)   Wt 117 lb 9.6 oz (53.3 kg)   SpO2 99%   BMI 22.22 kg/m      Objective:   Physical Exam   General  Mental Status - Alert. General Appearance - Well groomed. Not in acute distress.  Skin Rashes- No Rashes.  HEENT Head- Normal. Ear Auditory Canal - Left- Normal. Right - Normal.Tympanic Membrane- Left- Normal. Right- Normal. Eye Sclera/Conjunctiva- Left- Normal. Right- Normal. Nose & Sinuses Nasal Mucosa- Left-  Boggy and Congested. Right-  Boggy and  Congested.Bilateral no maxillary and no frontal sinus pressure. Mouth & Throat Lips: Upper Lip- Normal: no dryness, cracking, pallor, cyanosis, or vesicular eruption. Lower Lip-Normal: no dryness, cracking, pallor, cyanosis or vesicular eruption. Buccal Mucosa- Bilateral- No Aphthous ulcers. Oropharynx- No Discharge or Erythema. Tonsils: Characteristics- Bilateral- No Erythema or Congestion. Size/Enlargement- Bilateral- No enlargement. Discharge- bilateral-None.  Neck Neck- Supple. No Masses.   Chest and Lung Exam Auscultation: Breath Sounds:- even and unlabored. Shallow respirations.   Cardiovascular Auscultation:Rythm- Regular, rate and rhythm. Murmurs & Other Heart Sounds:Ausculatation of the heart reveal- No Murmurs.  Lymphatic Head & Neck General Head & Neck Lymphatics: Bilateral: Description- No Localized lymphadenopathy.      Assessment & Plan:  You do have bronchitis type  symptoms recently.  With some wheezing as well.  I am prescribing you  doxycycline antibiotic.  For cough I am prescribing benzonatate.  Refill your Advair inhaler.  Continue with Spiriva and you have neb machine/albuterol to use as needed.  Initially I was going to provide you with taper prednisone prescription to use if you wheezing were to worsen over the weekend.  But you remind me you cannot tolerate oral prednisone.  You have done well with Depo-Medrol injection in our office.  So in light of upcoming long 3-day weekend we gave you Depo-Medrol 40 mg IM.  If symptoms worsen or persist then recommend chest x-ray.  Follow-up in 5 days or as needed.  Mackie Pai, PA-C

## 2017-12-26 DIAGNOSIS — Z1231 Encounter for screening mammogram for malignant neoplasm of breast: Secondary | ICD-10-CM | POA: Diagnosis not present

## 2017-12-26 LAB — HM MAMMOGRAPHY

## 2018-01-09 ENCOUNTER — Encounter: Payer: Self-pay | Admitting: Family Medicine

## 2018-01-13 ENCOUNTER — Other Ambulatory Visit: Payer: Self-pay | Admitting: Family Medicine

## 2018-02-05 ENCOUNTER — Other Ambulatory Visit: Payer: Self-pay | Admitting: Acute Care

## 2018-02-05 DIAGNOSIS — F17210 Nicotine dependence, cigarettes, uncomplicated: Secondary | ICD-10-CM

## 2018-02-05 DIAGNOSIS — Z122 Encounter for screening for malignant neoplasm of respiratory organs: Secondary | ICD-10-CM

## 2018-02-05 NOTE — Progress Notes (Signed)
chst c 

## 2018-02-13 ENCOUNTER — Ambulatory Visit (INDEPENDENT_AMBULATORY_CARE_PROVIDER_SITE_OTHER)
Admission: RE | Admit: 2018-02-13 | Discharge: 2018-02-13 | Disposition: A | Discharge status: Home / Self Care | Payer: Medicare HMO | Source: Ambulatory Visit | Attending: Acute Care | Admitting: Acute Care

## 2018-02-13 ENCOUNTER — Ambulatory Visit (INDEPENDENT_AMBULATORY_CARE_PROVIDER_SITE_OTHER): Payer: Medicare HMO | Admitting: Acute Care

## 2018-02-13 ENCOUNTER — Encounter: Payer: Self-pay | Admitting: Acute Care

## 2018-02-13 DIAGNOSIS — F17210 Nicotine dependence, cigarettes, uncomplicated: Secondary | ICD-10-CM | POA: Diagnosis not present

## 2018-02-13 DIAGNOSIS — Z122 Encounter for screening for malignant neoplasm of respiratory organs: Secondary | ICD-10-CM

## 2018-02-13 NOTE — Progress Notes (Signed)
Shared Decision Making Visit Lung Cancer Screening Program [phone])   Eligibility:  Age 72 y.o.  Pack Years Smoking History Calculation 37 pack year smoking history (# packs/per year x # years smoked)  Recent History of coughing up blood  no  Unexplained weight loss? no ( >Than 15 pounds within the last 6 months )  Prior History Lung / other cancer no (Diagnosis within the last 5 years already requiring surveillance chest CT Scans).  Smoking Status Current Smoker  Former Smokers: Years since quit: NA  Quit Date: NA  Visit Components:  Discussion included one or more decision making aids. yes  Discussion included risk/benefits of screening. yes  Discussion included potential follow up diagnostic testing for abnormal scans. yes  Discussion included meaning and risk of over diagnosis. yes  Discussion included meaning and risk of False Positives. yes  Discussion included meaning of total radiation exposure. yes  Counseling Included:  Importance of adherence to annual lung cancer LDCT screening. yes  Impact of comorbidities on ability to participate in the program. yes  Ability and willingness to under diagnostic treatment. yes  Smoking Cessation Counseling:  Current Smokers:   Discussed importance of smoking cessation. yes  Information about tobacco cessation classes and interventions provided to patient. yes  Patient provided with "ticket" for LDCT Scan. yes  Symptomatic Patient. no  Counseling  Diagnosis Code: Tobacco Use Z72.0  Asymptomatic Patient yes  Counseling (Intermediate counseling: > three minutes counseling) S0109  Former Smokers:   Discussed the importance of maintaining cigarette abstinence. yes  Diagnosis Code: Personal History of Nicotine Dependence. N23.557  Information about tobacco cessation classes and interventions provided to patient. Yes  Patient provided with "ticket" for LDCT Scan. yes  Written Order for Lung Cancer  Screening with LDCT placed in Epic. Yes (CT Chest Lung Cancer Screening Low Dose W/O CM) [id number] Z12.2-Screening of respiratory organs Z87.891-Personal history of nicotine dependence  I have spent 25 minutes of face to face time with Ms. Kostelecky discussing the risks and benefits of lung cancer screening. We viewed a power point together that explained in detail the above noted topics. We paused at intervals to allow for questions to be asked and answered to ensure understanding.We discussed that the single most powerful action that she can take to decrease her risk of developing lung cancer is to quit smoking. We discussed whether or not she is ready to commit to setting a quit date. We discussed options for tools to aid in quitting smoking including nicotine replacement therapy, non-nicotine medications, support groups, Quit Smart classes, and behavior modification. We discussed that often times setting smaller, more achievable goals, such as eliminating 1 cigarette a day for a week and then 2 cigarettes a day for a week can be helpful in slowly decreasing the number of cigarettes smoked. This allows for a sense of accomplishment as well as providing a clinical benefit. I gave her the " Be Stronger Than Your Excuses" card with contact information for community resources, classes, free nicotine replacement therapy, and access to mobile apps, text messaging, and on-line smoking cessation help. I have also given her my card and contact information in the event she needs to contact me. We discussed the time and location of the scan, and that either Doroteo Glassman RN or I will call with the results within 24-48 hours of receiving them. I have offered her  a copy of the power point we viewed  as a resource in the event they need reinforcement of  the concepts we discussed today in the office. The patient verbalized understanding of all of  the above and had no further questions upon leaving the office. They have my  contact information in the event they have any further questions.  I spent 4 minutes counseling on smoking cessation and the health risks of continued tobacco abuse.  I explained to the patient that there has been a high incidence of coronary artery disease noted on these exams. I explained that this is a non-gated exam therefore degree or severity cannot be determined. This patient is currently on statin therapy. I have asked the patient to follow-up with their PCP regarding any incidental finding of coronary artery disease and management with diet or medication as their PCP  feels is clinically indicated. The patient verbalized understanding of the above and had no further questions upon completion of the visit.        Magdalen Spatz, NP 02/13/2018  12:18 PM

## 2018-02-15 ENCOUNTER — Other Ambulatory Visit: Payer: Self-pay | Admitting: Gastroenterology

## 2018-02-18 ENCOUNTER — Other Ambulatory Visit (INDEPENDENT_AMBULATORY_CARE_PROVIDER_SITE_OTHER): Payer: Medicare HMO

## 2018-02-18 DIAGNOSIS — E785 Hyperlipidemia, unspecified: Secondary | ICD-10-CM | POA: Diagnosis not present

## 2018-02-18 DIAGNOSIS — I10 Essential (primary) hypertension: Secondary | ICD-10-CM

## 2018-02-18 LAB — COMPREHENSIVE METABOLIC PANEL
ALBUMIN: 4 g/dL (ref 3.5–5.2)
ALK PHOS: 76 U/L (ref 39–117)
ALT: 10 U/L (ref 0–35)
AST: 10 U/L (ref 0–37)
BUN: 15 mg/dL (ref 6–23)
CO2: 31 mEq/L (ref 19–32)
Calcium: 8.9 mg/dL (ref 8.4–10.5)
Chloride: 105 mEq/L (ref 96–112)
Creatinine, Ser: 1 mg/dL (ref 0.40–1.20)
GFR: 58.31 mL/min — ABNORMAL LOW (ref >60.00)
Glucose, Bld: 117 mg/dL — ABNORMAL HIGH (ref 70–99)
Potassium: 4.3 mEq/L (ref 3.5–5.1)
Sodium: 142 mEq/L (ref 135–145)
Total Bilirubin: 0.3 mg/dL (ref 0.2–1.2)
Total Protein: 6.4 g/dL (ref 6.0–8.3)

## 2018-02-18 LAB — LIPID PANEL
CHOLESTEROL: 203 mg/dL — AB (ref 0–200)
HDL: 32.9 mg/dL — ABNORMAL LOW (ref >39.00)
LDL CALC: 131 mg/dL — AB (ref 0–99)
NonHDL: 169.97
TRIGLYCERIDES: 193 mg/dL — AB (ref 0.0–149.0)
Total CHOL/HDL Ratio: 6
VLDL: 38.6 mg/dL (ref 0.0–40.0)

## 2018-02-21 ENCOUNTER — Other Ambulatory Visit: Payer: Self-pay | Admitting: Acute Care

## 2018-02-21 DIAGNOSIS — F17210 Nicotine dependence, cigarettes, uncomplicated: Secondary | ICD-10-CM

## 2018-02-21 DIAGNOSIS — Z122 Encounter for screening for malignant neoplasm of respiratory organs: Secondary | ICD-10-CM

## 2018-02-25 ENCOUNTER — Telehealth: Payer: Self-pay

## 2018-02-25 DIAGNOSIS — R739 Hyperglycemia, unspecified: Secondary | ICD-10-CM

## 2018-02-25 DIAGNOSIS — E781 Pure hyperglyceridemia: Secondary | ICD-10-CM

## 2018-02-25 DIAGNOSIS — E785 Hyperlipidemia, unspecified: Secondary | ICD-10-CM

## 2018-02-25 NOTE — Telephone Encounter (Signed)
Would like to refer her to lipid clinic

## 2018-02-25 NOTE — Telephone Encounter (Signed)
Pt. returned author's phone call. Pt. states she is still taking lipitor and zetia. Author advised to stop taking lipitor and start taking crestor, but pt. declined, stating " I've taken crestor before, and it's given me horrible muscle cramps". Other labs reviewed with pt. Future lab orders placed per Dr. Etter Sjogren, but pt. could not  make an appointment at this time. Routed to Dr. Etter Sjogren to advise.

## 2018-02-25 NOTE — Telephone Encounter (Signed)
Author phoned pt. To clarify medication use per Dr. Etter Sjogren. No answer, author left generic VM for pt. To return call [phone].

## 2018-02-25 NOTE — Telephone Encounter (Signed)
-----   Message from [name], DO sent at 02/24/2018  6:42 PM EDT ----- Is pt taking lipitor and zetia?  If yes-- d/c lipitor and change to crestor 20 mg #30  1 po qd, 2 refills   Recheck 3 months Lipitor, cmp

## 2018-02-26 NOTE — Telephone Encounter (Signed)
Author phoned pt. to notify her of pending referral to lipid clinic per Dr. Etter Sjogren. No answer, author left detailed VM. Order pended for Dr. Etter Sjogren review.

## 2018-03-14 ENCOUNTER — Encounter: Payer: Self-pay | Admitting: Gastroenterology

## 2018-03-21 ENCOUNTER — Encounter: Payer: Self-pay | Admitting: Medical

## 2018-03-21 ENCOUNTER — Ambulatory Visit (INDEPENDENT_AMBULATORY_CARE_PROVIDER_SITE_OTHER): Payer: Medicare HMO | Admitting: Medical

## 2018-03-21 VITALS — BP 139/56 | HR 68 | Temp 98.2°F | Resp 16 | Ht 61.0 in | Wt 118.6 lb

## 2018-03-21 DIAGNOSIS — T7840XA Allergy, unspecified, initial encounter: Secondary | ICD-10-CM | POA: Diagnosis not present

## 2018-03-21 DIAGNOSIS — R591 Generalized enlarged lymph nodes: Secondary | ICD-10-CM

## 2018-03-21 DIAGNOSIS — L089 Local infection of the skin and subcutaneous tissue, unspecified: Secondary | ICD-10-CM | POA: Diagnosis not present

## 2018-03-21 MED ORDER — METHYLPREDNISOLONE ACETATE 40 MG/ML IJ SUSP
40.0000 mg | Freq: Once | INTRAMUSCULAR | Status: AC
  Administered 2018-03-21: 40 mg via INTRAMUSCULAR

## 2018-03-21 MED ORDER — DOXYCYCLINE HYCLATE 100 MG PO TABS: 100.0000 mg | ORAL_TABLET | Freq: Two times a day (BID) | ORAL | 0 refills | Status: DC

## 2018-03-21 NOTE — Patient Instructions (Addendum)
On discussion today and exam the area under your right eyebrow may be allergic reaction to insect bite when you were working outside or other type exposure.  Initially considered short course of taper prednisone but you cannot tolerate GI effects so we gave you Depo-Medrol 40 mg IM.  You can continue to use Neosporin that area as well to help prevent secondary infection.  Also we discussed watching out for any worsening rash or vesicle interruption.  For any vesicle eruption would go ahead and prescribe antiviral for shingles.  So please let us know immediately if that were to occur.  For recent possible sinus infection and nasal congestion, I am prescribing doxycycline antibiotic.  Depo-Medrol given for possible allergic reaction might decrease your congestion as well. We will see how the slightly inflamed lymph node behind your right ear responds to the antibiotic.  If you could give [place] an update in about 10 days if that lymph node still palpable.  Follow-up in 7 to 10 days or as needed.

## 2018-03-21 NOTE — Progress Notes (Signed)
Subjective:    Patient ID: Leah [name], female    DOB: [birth year], 72 y.o.   MRN: [id number]  HPI  Pt in with some rash just under rt eye brow since tuesday. She was inside her house on couch and area started to itch and sting. Pt does not report any vesicle or blisters.  Area still itches. But no eye pain or vision changes.  Pt states was working in yard about 10 minutes before she went inside. She speculates something my have bit her.   Pt initially put alcohol and neosporin on the area.  Pt also has some recent sinus pressure and nasal congestion past 2 weeks. Also note small lump behind rt ear present over last 10 days. This lump not tender   Review of Systems  Constitutional: Negative for chills, fatigue and fever.  HENT: Positive for congestion and sinus pressure. Negative for postnasal drip.   Respiratory: Negative for chest tightness, shortness of breath, wheezing and stridor.   Cardiovascular: Negative for chest pain and palpitations.  Skin:       Rt side rash below eye brow.  Hematological: Positive for adenopathy.  Psychiatric/Behavioral: Negative for agitation and confusion.    Past Medical History:  Diagnosis Date  . Anxiety   . Barrett esophagus   . Bladder pain   . Chronic bronchitis ([place])    per pt last episode july 2016  . Complication of anesthesia    slow to wake  . Depression   . Diverticulosis   . Frequency of urination   . GERD (gastroesophageal reflux disease)   . Hiatal hernia   . History of adenomatous polyp of colon    tubular adenoma  . History of chronic gastritis   . History of diverticulitis of colon   . History of esophageal dilatation    for stricture  . History of esophageal spasm   . Hyperlipidemia   . Hypertension   . IBS (irritable bowel syndrome)   . Interstitial cystitis   . Productive cough    intermittant  . Smokers' cough ([place])   . Urgency of urination      Social History   Socioeconomic History  . Marital  status: Married    Spouse name: Not on file  . Number of children: Not on file  . Years of education: Not on file  . Highest education level: Not on file  Occupational History  . Not on file  Social Needs  . Financial resource strain: Not on file  . Food insecurity:    Worry: Not on file    Inability: Not on file  . Transportation needs:    Medical: Not on file    Non-medical: Not on file  Tobacco Use  . Smoking status: Current Every Day Smoker    Packs/day: 1.00    Years: 47.00    Pack years: 47.00    Types: Cigarettes  . Smokeless tobacco: Never Used  . Tobacco comment: counseled about smoking cessation  Substance and Sexual Activity  . Alcohol use: No    Alcohol/week: 0.0 standard drinks  . Drug use: No  . Sexual activity: Not Currently    Birth control/protection: Post-menopausal  Lifestyle  . Physical activity:    Days per week: Not on file    Minutes per session: Not on file  . Stress: Not on file  Relationships  . Social connections:    Talks on phone: Not on file    Gets together: Not on  file    Attends religious service: Not on file    Active member of club or organization: Not on file    Attends meetings of clubs or organizations: Not on file    Relationship status: Not on file  . Intimate partner violence:    Fear of current or ex partner: Not on file    Emotionally abused: Not on file    Physically abused: Not on file    Forced sexual activity: Not on file  Other Topics Concern  . Not on file  Social History Narrative  . Not on file    Past Surgical History:  Procedure Laterality Date  . ABDOMINAL HYSTERECTOMY  1975  . ANTERIOR CERVICAL DECOMP/DISCECTOMY FUSION N/A 09/07/2014   Procedure: Exploration of Fusion and Removal of Anterior Cervical Hematoma;  Surgeon: Newman Pies, MD;  Location: [facility];  Service: Neurosurgery;  Laterality: N/A;  . ANTERIOR FUSION CERVICAL SPINE  09/03/2014   C5 -- C7  . APPENDECTOMY    . CARPAL TUNNEL RELEASE  Right 1993  . COLON SURGERY    . COLONOSCOPY  last one 04-30-2013  . CYSTO WITH HYDRODISTENSION N/A 06/08/2015   Procedure: CYSTOSCOPY/HYDRODISTENSION INSTILLATION OF MARCAINE AND PYRIDIUM;  Surgeon: Irine Seal, MD;  Location: [facility];  Service: Urology;  Laterality: N/A;  . CYSTO/  HOD/  BLADDER BX  1990's  . ESOPHAGOGASTRODUODENOSCOPY  last one 09-11-2012  . LAPAROSCOPIC APPENDECTOMY N/A 09/21/2013   Procedure: APPENDECTOMY LAPAROSCOPIC;  Surgeon: Rolm Bookbinder, MD;  Location: [place];  Service: General;  Laterality: N/A;  . LAPAROSCOPIC CHOLECYSTECTOMY  2002  . LAPAROSCOPY SIGMOID COLECTOMY  10-05-2008   diverticulitis  . MICROLARYNGOSCOPY  09-27-2006   w/ true vocal cord stripping  and bilateral bx's of lesion's (benign)  . SINUS SURGERY WITH [place]  . [place]  . TONSILLECTOMY AND ADENOIDECTOMY  1964    Family History  Problem Relation Age of Onset  . Pancreatic cancer Mother   . Colon polyps Mother   . Pancreatic cancer Father   . Diabetes Father   . Heart disease Father   . Leukemia Father   . Prostate cancer Father   . Hypertension Father   . Colon cancer Neg Hx     Allergies  Allergen Reactions  . Amoxicillin-Pot Clavulanate Shortness Of Breath, Itching and Swelling  . Avelox [Moxifloxacin Hcl In Nacl] Swelling    Swelling, itching and shortness of breath  . Ciprofloxacin Shortness Of Breath, Itching and Swelling  . Keflex [Cephalexin] Shortness Of Breath and Swelling  . Moxifloxacin Shortness Of Breath, Itching and Swelling  . Penicillins Shortness Of Breath, Itching and Swelling  . Sulfa Antibiotics Shortness Of Breath, Itching and Swelling  . Prednisone Other (See Comments)    GI irritation; is able to tolerate injections  . Acyclovir And Related Other (See Comments)    Pt does not recall this reaction    Current Outpatient Medications on File Prior to Visit  Medication Sig Dispense Refill  .  albuterol (PROVENTIL) (2.5 MG/3ML) 0.083% nebulizer solution Inhale 3 mLs (2.5 mg total) into the lungs every 4 (four) hours as needed for wheezing or shortness of breath. q 4-6 hours as needed 75 mL 0  . aspirin EC 81 MG tablet Take 1 tablet (81 mg total) by mouth daily. Over the counter    . atorvastatin (LIPITOR) 80 MG tablet Take 1 tablet (80 mg total) by mouth daily. 90 tablet 1  . atorvastatin (  LIPITOR) 80 MG tablet TAKE 1 TABLET BY MOUTH EVERY DAY 90 tablet 1  . azelastine (ASTELIN) 0.1 % nasal spray Place 2 sprays into both nostrils 2 (two) times daily. Use in each nostril as directed 30 mL 3  . benzonatate (TESSALON) 100 MG capsule Take 1 capsule (100 mg total) by mouth 3 (three) times daily as needed for cough. 30 capsule 0  . cyclobenzaprine (FLEXERIL) 5 MG tablet Take 1 tablet (5 mg total) by mouth 3 (three) times daily. 90 tablet 1  . dicyclomine (BENTYL) 10 MG capsule TAKE 1 CAPSULE (10 MG TOTAL) BY MOUTH AS NEEDED FOR UP TO 1 DOSE FOR SPASMS. 60 capsule 0  . doxycycline (VIBRA-TABS) 100 MG tablet Take 1 tablet (100 mg total) by mouth 2 (two) times daily. Can give caps or generic 20 tablet 0  . esomeprazole (NEXIUM) 40 MG capsule Take 1 capsule (40 mg total) by mouth 2 (two) times daily before a meal. 180 capsule 1  . estradiol (CLIMARA - DOSED IN MG/24 HR) 0.1 mg/24hr patch Place 0.1 mg onto the skin every Monday.  0  . ezetimibe (ZETIA) 10 MG tablet Take 1 tablet (10 mg total) by mouth daily. 90 tablet 1  . fluticasone (FLONASE) 50 MCG/ACT nasal spray Place 2 sprays into both nostrils daily. 16 g 6  . Fluticasone-Salmeterol (ADVAIR DISKUS) 250-50 MCG/DOSE AEPB Inhale 1 puff into the lungs 2 (two) times daily. 1 each 5  . ibuprofen (ADVIL,MOTRIN) 200 MG tablet Take 400 mg by mouth every 6 (six) hours as needed (pain).    Marland Kitchen lisinopril (PRINIVIL,ZESTRIL) 20 MG tablet Take 1 tablet (20 mg total) by mouth daily. 90 tablet 1  . LORazepam (ATIVAN) 0.5 MG tablet Take 0.5 mg by mouth at  bedtime.   5  . polyethylene glycol (MIRALAX / GLYCOLAX) packet Take 17 g by mouth daily as needed for moderate constipation. 14 each 0  . senna-docusate (SENOKOT-S) 8.6-50 MG tablet Take 1 tablet by mouth at bedtime. 30 tablet 0  . sertraline (ZOLOFT) 50 MG tablet Take 50 mg by mouth at bedtime.    . sodium chloride (OCEAN) 0.65 % SOLN nasal spray Place 1 spray into both nostrils daily as needed for congestion.    Marland Kitchen tiotropium (SPIRIVA HANDIHALER) 18 MCG inhalation capsule Place 1 capsule (18 mcg total) into inhaler and inhale daily. 90 capsule 1  . tiZANidine (ZANAFLEX) 4 MG tablet Take 1 tablet (4 mg total) by mouth 3 (three) times daily as needed for muscle spasms. 30 tablet 0  . triamcinolone (NASACORT) 55 MCG/ACT AERO nasal inhaler Place 2 sprays into the nose daily. 1 Inhaler 12   No current facility-administered medications on file prior to visit.     BP (!) 139/56   Pulse 68   Temp 98.2 F (36.8 C) (Oral)   Resp 16   Ht 5\' 1"  (1.549 m)   Wt 118 lb 9.6 oz (53.8 kg)   SpO2 98%   BMI 22.41 kg/m       Objective:   Physical Exam  General  Mental Status - Alert. General Appearance - Well groomed. Not in acute distress.  Skin Just below rt eye brow mild flaky rash. Small scab in center.(no surrounding vesicles in area)  HEENT Head- Normal. Ear Auditory Canal - Left- Normal. Right - Normal.Tympanic Membrane- Left- Normal. Right- Normal.(behind rt ear small enlarged lymph node. Faint palpable.) Eye Sclera/Conjunctiva- Left- Normal. Right- Normal. Nose & Sinuses Nasal Mucosa- Left-  Boggy and Congested. Right-  Boggy and  Congested.Bilateral faint maxillary and frontal sinus pressure. Mouth & Throat Lips: Upper Lip- Normal: no dryness, cracking, pallor, cyanosis, or vesicular eruption. Lower Lip-Normal: no dryness, cracking, pallor, cyanosis or vesicular eruption. Buccal Mucosa- Bilateral- No Aphthous ulcers. Oropharynx- No Discharge or Erythema. Tonsils: Characteristics-  Bilateral- No Erythema or Congestion. Size/Enlargement- Bilateral- No enlargement. Discharge- bilateral-None.  Neck Neck- Supple. No Masses.   Chest and Lung Exam Auscultation: Breath Sounds:-Clear even and unlabored.  Cardiovascular Auscultation:Rythm- Regular, rate and rhythm. Murmurs & Other Heart Sounds:Ausculatation of the heart reveal- No Murmurs.  Lymphatic Head & Neck General Head & Neck Lymphatics: Bilateral: Description- No Localized lymphadenopathy.       Assessment & Plan:  On discussion today and exam the area under your right eyebrow may be allergic reaction to insect bite when you were working outside or other type exposure.  Initially considered short course of taper prednisone but you cannot tolerate GI effects so we gave you Depo-Medrol 40 mg IM.  You can continue to use Neosporin that area as well to help prevent secondary infection.  Also we discussed watching out for any worsening rash or vesicle interruption.  For any vesicle eruption would go ahead and prescribe antiviral for shingles.  So please let us know immediately if that were to occur.  For recent possible sinus infection and nasal congestion, I am prescribing doxycycline antibiotic.  Depo-Medrol given for possible allergic reaction might decrease your congestion as well. We will see how the slightly inflamed lymph node behind your right ear responds to the antibiotic.  If you could give [place] an update in about 10 days if that lymph node still palpable.  Follow-up in 7 to 10 days or as needed.  Mackie Pai, PA-C

## 2018-03-21 NOTE — Addendum Note (Signed)
Addended by: Hinton Dyer on: 03/21/2018 04:30 PM   Modules accepted: Orders

## 2018-03-29 ENCOUNTER — Ambulatory Visit (INDEPENDENT_AMBULATORY_CARE_PROVIDER_SITE_OTHER): Payer: Medicare HMO | Admitting: Family Medicine

## 2018-03-29 ENCOUNTER — Encounter: Payer: Self-pay | Admitting: Family Medicine

## 2018-03-29 VITALS — BP 112/70 | HR 80 | Temp 98.6°F | Ht 61.0 in | Wt 117.2 lb

## 2018-03-29 DIAGNOSIS — L03213 Periorbital cellulitis: Secondary | ICD-10-CM | POA: Diagnosis not present

## 2018-03-29 MED ORDER — LINEZOLID 600 MG PO TABS: 600.0000 mg | ORAL_TABLET | Freq: Two times a day (BID) | ORAL | 0 refills | Status: DC

## 2018-03-29 NOTE — Patient Instructions (Addendum)
Things to look out for: increasing pain not relieved by ibuprofen/acetaminophen, fevers, spreading redness, drainage of pus, or foul odor.  Use Aveeno as needed around the eye for scaling and itchiness. Continue twice daily hydrocortisone.  A new antibiotic has been called in. If no improvement, let Dr. Etter Sjogren know on Tuesday.  Let us know if you need anything.

## 2018-03-29 NOTE — Progress Notes (Signed)
Pre visit review using our clinic review tool, if applicable. No additional management support is needed unless otherwise documented below in the visit note. 

## 2018-03-29 NOTE — Progress Notes (Signed)
Chief Complaint  Patient presents with  . Eye Pain    right eye     Leah [name] is here for right eye irritation.  Duration: 10 days  Pain, burning, scaling, itching. Denies vision changes, globe pain, pain with movement of eyes, fevers Chemical exposure? No   No new lotions, soaps, topicals or detergents Recent URI? No  Treatment to date: Depomedrol, Doxy for 7 d. Neither helped, feels it is getting worse.   ROS:  Eyes: As noted above Const: no fevers  Past Medical History:  Diagnosis Date  . Anxiety   . Barrett esophagus   . Bladder pain   . Chronic bronchitis ([place])    per pt last episode july 2016  . Complication of anesthesia    slow to wake  . Depression   . Diverticulosis   . Frequency of urination   . GERD (gastroesophageal reflux disease)   . Hiatal hernia   . History of adenomatous polyp of colon    tubular adenoma  . History of chronic gastritis   . History of diverticulitis of colon   . History of esophageal dilatation    for stricture  . History of esophageal spasm   . Hyperlipidemia   . Hypertension   . IBS (irritable bowel syndrome)   . Interstitial cystitis   . Productive cough    intermittant  . Smokers' cough ([place])   . Urgency of urination      BP 112/70 (BP Location: Left Arm, Patient Position: Sitting, Cuff Size: Normal)   Pulse 80   Temp 98.6 F (37 C) (Oral)   Ht 5\' 1"  (1.549 m)   Wt 117 lb 4 oz (53.2 kg)   SpO2 95%   BMI 22.15 kg/m  Gen: Awake, alert, appears stated age Eyes: Lids, inner are neg, Sclera white, PERRLA, EOMi, no ttp over globes with light palpation over lids Nose: Nares patent without discharge Mouth: MMM, pharynx without erythema or exudate Skin: See below Psych: Age appropriate judgment and insight; mood and affect normal  Media Information    Media Information     Periorbital cellulitis of right eye - Plan: linezolid (ZYVOX) 600 MG tablet  Orders as above. Abx choice based on allergies and  that she has already tried Doxy. Lotions and topical steroid for itching/scaling. If no improvement over weekend, will obtain CT scan. Has appt with Dr. Etter Sjogren in 4 d.  Warning signs and symptoms verbalized and written down in AVS.  Pt voiced understanding and agreement to the plan.  Bell, DO 03/29/18 3:14 PM

## 2018-04-02 ENCOUNTER — Ambulatory Visit (INDEPENDENT_AMBULATORY_CARE_PROVIDER_SITE_OTHER): Payer: Medicare HMO | Admitting: Family Medicine

## 2018-04-02 ENCOUNTER — Encounter: Payer: Self-pay | Admitting: Family Medicine

## 2018-04-02 VITALS — BP 129/56 | HR 71 | Temp 98.3°F | Resp 16 | Ht 61.0 in | Wt 116.8 lb

## 2018-04-02 DIAGNOSIS — L03213 Periorbital cellulitis: Secondary | ICD-10-CM

## 2018-04-02 DIAGNOSIS — Z23 Encounter for immunization: Secondary | ICD-10-CM

## 2018-04-02 MED ORDER — DOXYCYCLINE HYCLATE 100 MG PO TABS: 100.0000 mg | ORAL_TABLET | Freq: Two times a day (BID) | ORAL | 0 refills | Status: DC

## 2018-04-02 NOTE — Progress Notes (Signed)
Patient ID: Leah [name], female    DOB: 1945-09-05  Age: 72 y.o. MRN: [id number]    Subjective:  Subjective  HPI Leah [name] presents for f/u cellulitis eye.  It has improved but is still burning and itching.  Last 2-3 ov notes were reviewed and er visit.  Pt was unable to take abx written last zyvox -- it upset her stomach and caused diarrhea.    Review of Systems  Constitutional: Positive for fatigue. Negative for activity change, appetite change and unexpected weight change.  Eyes: Positive for redness and itching. Negative for photophobia and visual disturbance.  Respiratory: Negative for cough and shortness of breath.   Cardiovascular: Negative for chest pain and palpitations.  Psychiatric/Behavioral: Negative for behavioral problems and dysphoric mood. The patient is not nervous/anxious.     History Past Medical History:  Diagnosis Date  . Anxiety   . Barrett esophagus   . Bladder pain   . Chronic bronchitis ([place])    per pt last episode july 2016  . Complication of anesthesia    slow to wake  . Depression   . Diverticulosis   . Frequency of urination   . GERD (gastroesophageal reflux disease)   . Hiatal hernia   . History of adenomatous polyp of colon    tubular adenoma  . History of chronic gastritis   . History of diverticulitis of colon   . History of esophageal dilatation    for stricture  . History of esophageal spasm   . Hyperlipidemia   . Hypertension   . IBS (irritable bowel syndrome)   . Interstitial cystitis   . Productive cough    intermittant  . Smokers' cough ([place])   . Urgency of urination     She has a past surgical history that includes Carpal tunnel release (Right, 1993); Sinus surgery with [employer]at[employer] (1991); laparoscopic appendectomy (N/A, 09/21/2013); Anterior fusion cervical spine (09/03/2014); Anterior cervical decomp/discectomy fusion (N/A, 09/07/2014); Tonsillectomy and adenoidectomy (1964); Laparoscopic cholecystectomy (2002);  Abdominal hysterectomy (1975); Temporomandibular joint surgery (1990); LAPAROSCOPY SIGMOID COLECTOMY (10-05-2008); Microlaryngoscopy (09-27-2006); Colonoscopy (last one 04-30-2013); Esophagogastroduodenoscopy (last one 09-11-2012); CYSTO/  HOD/  BLADDER BX (1990's); cysto with hydrodistension (N/A, 06/08/2015); Colon surgery; and Appendectomy.   Her family history includes Colon polyps in her mother; Diabetes in her father; Heart disease in her father; Hypertension in her father; Leukemia in her father; Pancreatic cancer in her father and mother; Prostate cancer in her father.She reports that she has been smoking cigarettes. She has a 47.00 pack-year smoking history. She has never used smokeless tobacco. She reports that she does not drink alcohol or use drugs.  Current Outpatient Medications on File Prior to Visit  Medication Sig Dispense Refill  . albuterol (PROVENTIL) (2.5 MG/3ML) 0.083% nebulizer solution Inhale 3 mLs (2.5 mg total) into the lungs every 4 (four) hours as needed for wheezing or shortness of breath. q 4-6 hours as needed 75 mL 0  . aspirin EC 81 MG tablet Take 1 tablet (81 mg total) by mouth daily. Over the counter    . atorvastatin (LIPITOR) 80 MG tablet Take 1 tablet (80 mg total) by mouth daily. 90 tablet 1  . atorvastatin (LIPITOR) 80 MG tablet TAKE 1 TABLET BY MOUTH EVERY DAY 90 tablet 1  . azelastine (ASTELIN) 0.1 % nasal spray Place 2 sprays into both nostrils 2 (two) times daily. Use in each nostril as directed 30 mL 3  . benzonatate (TESSALON) 100 MG capsule Take 1 capsule (100 mg total)  by mouth 3 (three) times daily as needed for cough. 30 capsule 0  . dicyclomine (BENTYL) 10 MG capsule TAKE 1 CAPSULE (10 MG TOTAL) BY MOUTH AS NEEDED FOR UP TO 1 DOSE FOR SPASMS. 60 capsule 0  . esomeprazole (NEXIUM) 40 MG capsule Take 1 capsule (40 mg total) by mouth 2 (two) times daily before a meal. 180 capsule 1  . estradiol (CLIMARA - DOSED IN MG/24 HR) 0.1 mg/24hr patch Place 0.1 mg  onto the skin every Monday.  0  . ezetimibe (ZETIA) 10 MG tablet Take 1 tablet (10 mg total) by mouth daily. 90 tablet 1  . fluticasone (FLONASE) 50 MCG/ACT nasal spray Place 2 sprays into both nostrils daily. 16 g 6  . Fluticasone-Salmeterol (ADVAIR DISKUS) 250-50 MCG/DOSE AEPB Inhale 1 puff into the lungs 2 (two) times daily. 1 each 5  . ibuprofen (ADVIL,MOTRIN) 200 MG tablet Take 400 mg by mouth every 6 (six) hours as needed (pain).    Marland Kitchen lisinopril (PRINIVIL,ZESTRIL) 20 MG tablet Take 1 tablet (20 mg total) by mouth daily. 90 tablet 1  . LORazepam (ATIVAN) 0.5 MG tablet Take 0.5 mg by mouth at bedtime.   5  . polyethylene glycol (MIRALAX / GLYCOLAX) packet Take 17 g by mouth daily as needed for moderate constipation. 14 each 0  . senna-docusate (SENOKOT-S) 8.6-50 MG tablet Take 1 tablet by mouth at bedtime. 30 tablet 0  . sertraline (ZOLOFT) 50 MG tablet Take 50 mg by mouth at bedtime.    . sodium chloride (OCEAN) 0.65 % SOLN nasal spray Place 1 spray into both nostrils daily as needed for congestion.    Marland Kitchen tiotropium (SPIRIVA HANDIHALER) 18 MCG inhalation capsule Place 1 capsule (18 mcg total) into inhaler and inhale daily. 90 capsule 1  . tiZANidine (ZANAFLEX) 4 MG tablet Take 1 tablet (4 mg total) by mouth 3 (three) times daily as needed for muscle spasms. 30 tablet 0  . triamcinolone (NASACORT) 55 MCG/ACT AERO nasal inhaler Place 2 sprays into the nose daily. 1 Inhaler 12   No current facility-administered medications on file prior to visit.      Objective:  Objective  Physical Exam  Constitutional: She is oriented to person, place, and time. She appears well-developed and well-nourished.  HENT:  Head: Normocephalic and atraumatic.  Eyes: Conjunctivae and EOM are normal.  Eye lids not swollen but red and warm to touch See picture  Neck: Normal range of motion. Neck supple. No JVD present. Carotid bruit is not present. No thyromegaly present.  Cardiovascular: Normal rate, regular  rhythm and normal heart sounds.  No murmur heard. Pulmonary/Chest: Effort normal and breath sounds normal. No respiratory distress. She has no wheezes. She has no rales. She exhibits no tenderness.  Musculoskeletal: She exhibits no edema.  Neurological: She is alert and oriented to person, place, and time.  Psychiatric: She has a normal mood and affect.  Nursing note and vitals reviewed.  BP (!) 129/56 (BP Location: Right Arm, Cuff Size: Normal)   Pulse 71   Temp 98.3 F (36.8 C) (Oral)   Resp 16   Ht 5\' 1"  (1.549 m)   Wt 116 lb 12.8 oz (53 kg)   SpO2 98%   BMI 22.07 kg/m  Wt Readings from Last 3 Encounters:  04/02/18 116 lb 12.8 oz (53 kg)  03/29/18 117 lb 4 oz (53.2 kg)  03/21/18 118 lb 9.6 oz (53.8 kg)       Lab Results  Component Value Date  WBC 5.8 12/18/2017   HGB 13.5 12/18/2017   HCT 40.0 12/18/2017   PLT 212.0 12/18/2017   GLUCOSE 117 (H) 02/18/2018   CHOL 203 (H) 02/18/2018   TRIG 193.0 (H) 02/18/2018   HDL 32.90 (L) 02/18/2018   LDLDIRECT 151.0 11/07/2016   LDLCALC 131 (H) 02/18/2018   ALT 10 02/18/2018   AST 10 02/18/2018   NA 142 02/18/2018   K 4.3 02/18/2018   CL 105 02/18/2018   CREATININE 1.00 02/18/2018   BUN 15 02/18/2018   CO2 31 02/18/2018   TSH 1.92 03/30/2016   INR 1.0 05/21/2009   HGBA1C 6.1 12/18/2017   MICROALBUR <0.7 11/09/2014    Ct Chest Lung Ca Screen Low Dose W/o Cm  Result Date: 02/13/2018 CLINICAL DATA:  72 year old female current smoker with 37 pack year history of smoking. Lung cancer screening examination. EXAM: CT CHEST WITHOUT CONTRAST LOW-DOSE FOR LUNG CANCER SCREENING TECHNIQUE: Multidetector CT imaging of the chest was performed following the standard protocol without IV contrast. COMPARISON:  No priors. FINDINGS: Cardiovascular: Heart size is normal. There is no significant pericardial fluid, thickening or pericardial calcification. There is aortic atherosclerosis, as well as atherosclerosis of the great vessels of the  mediastinum and the coronary arteries, including calcified atherosclerotic plaque in the left main, left anterior descending, left circumflex and right coronary arteries. Calcifications of the aortic valve (mild) and moderate calcifications of the mitral annulus. Mediastinum/Nodes: No pathologically enlarged mediastinal or hilar lymph nodes. Please note that accurate exclusion of hilar adenopathy is limited on noncontrast CT scans. Esophagus is unremarkable in appearance. No axillary lymphadenopathy. Lungs/Pleura: Multiple small pulmonary nodules are noted throughout the lungs bilaterally, the largest of which is in the right lower lobe (axial image 235 of series 3), with a volume derived mean diameter of 2.9 mm. No larger more suspicious appearing pulmonary nodules or masses are noted. No acute consolidative airspace disease. No pleural effusions. Mild diffuse bronchial wall thickening with moderate centrilobular and paraseptal emphysema. Upper Abdomen: Aortic atherosclerosis. Status post cholecystectomy. Central low-attenuation lesion in the right lobe of the liver measuring 20 x 14 mm (axial image 57 of series 2), previously characterized as a simple cyst. Musculoskeletal: Orthopedic fixation hardware in the lower cervical spine. There are no aggressive appearing lytic or blastic lesions noted in the visualized portions of the skeleton. IMPRESSION: 1. Lung-RADS 2S, benign appearance or behavior. Continue annual screening with low-dose chest CT without contrast in 12 months. 2. The "S" modifier above refers to potentially clinically significant non lung cancer related findings. Specifically, there is aortic atherosclerosis, in addition to left main and 3 vessel coronary artery disease. Please note that although the presence of coronary artery calcium documents the presence of coronary artery disease, the severity of this disease and any potential stenosis cannot be assessed on this non-gated CT examination.  Assessment for potential risk factor modification, dietary therapy or pharmacologic therapy may be warranted, if clinically indicated. 3. Mild diffuse bronchial wall thickening with moderate centrilobular and paraseptal emphysema; imaging findings suggestive of underlying COPD. 4. There are calcifications of the aortic valve. Echocardiographic correlation for evaluation of potential valvular dysfunction may be warranted if clinically indicated. Aortic Atherosclerosis (ICD10-I70.0) and Emphysema (ICD10-J43.9). Electronically Signed   By: Vinnie Langton M.D.   On: 02/13/2018 15:41     Assessment & Plan:  Plan  I have discontinued Kathryn Cosby. Gil's doxycycline, doxycycline, and linezolid. I am also having her start on doxycycline. Additionally, I am having her maintain her sertraline, LORazepam,  fluticasone, estradiol, sodium chloride, ibuprofen, polyethylene glycol, senna-docusate, aspirin EC, azelastine, albuterol, tiotropium, triamcinolone, tiZANidine, atorvastatin, esomeprazole, lisinopril, ezetimibe, Fluticasone-Salmeterol, benzonatate, atorvastatin, and dicyclomine.  Meds ordered this encounter  Medications  . doxycycline (VIBRA-TABS) 100 MG tablet    Sig: Take 1 tablet (100 mg total) by mouth 2 (two) times daily.    Dispense:  20 tablet    Refill:  0    Problem List Items Addressed This Visit    None    Visit Diagnoses    Periorbital cellulitis of right eye    -  Primary   Relevant Medications   doxycycline (VIBRA-TABS) 100 MG tablet   Other Relevant Orders   Ambulatory referral to Ophthalmology   Tdap vaccine greater than or equal to 7yo IM (Completed)      Follow-up: Return in about 3 months (around 07/02/2018), or if symptoms worsen or fail to improve.  [name], DO

## 2018-04-02 NOTE — Patient Instructions (Signed)
Orbital Cellulitis Orbital cellulitis is an infection in the eye socket (orbit) and the tissues that surround the eye. The infection can spread to the eyelids, eyebrow area, and cheek. It can also cause a pocket of pus to develop around the eye (orbital abscess). In severe cases, the infection can spread to the brain. Orbital cellulitis is a medical emergency. What are the causes? The most common cause of this condition is a bacterial infection. The infection usually spreads to the eye socket from another part of the body. The infection may start in:  The nose or sinuses.  The eyelids.  Facial skin.  The bloodstream.  What increases the risk? This condition is more likely to develop in people who have recently had one of the following:  Upper respiratory infection.  Sinus infection.  Eyelid or facial infection.  Eye injury.  Infection that affects the entire body or the bloodstream (systemic infection).  What are the signs or symptoms? Symptoms of this condition usually start quickly. Symptoms include:  Eye pain that gets worse with eye movement.  Swelling around the eye.  Eye redness.  Bulging of the eye.  Inability to move the eye.  Double vision.  Fever.  How is this diagnosed? This condition may be diagnosed based on your symptoms and an eye exam. You may also have tests to confirm the diagnosis and to check for an orbital abscess. Other tests (cultures) may be done to find out what type of bacteria is causing the infection. Tests may include:  Complete blood count (CBC).  Blood culture.  Nose, sinus, or throat culture.  Imaging studies such as a CT scan or MRI.  How is this treated? This condition is usually treated in a hospital. Antibiotic medicines are given directly into a vein through an IV tube.  At first, you may get IV antibiotics to kill bacteria that often cause orbital cellulitis (broad spectrum antibiotics).  Your medicine may be changed if  cultures suggest that another antibiotic would be better.  If the IV antibiotics are working to treat your infection, you may be switched to oral antibiotics and allowed to go home.  In some cases, surgery may be needed to drain an orbital abscess.  Follow these instructions at home:  Take medicines only as directed by your health care provider.  Take your antibiotic medicine as directed by your health care provider. Finish the antibiotic even if you start to feel better.  Return to your normal activities as directed by your health care provider. Ask your health care provider what activities are safe for you.  Keep all follow-up visits as directed by your health care provider. This is important. Get help right away if:  Your eye pain or swelling returns or it gets worse.  You have any changes in your vision.  You have a fever. This information is not intended to replace advice given to you by your health care provider. Make sure you discuss any questions you have with your health care provider. Document Released: 07/11/2001 Document Revised: 12/23/2015 Document Reviewed: 07/13/2014 Elsevier Interactive Patient Education  [employer].

## 2018-04-03 ENCOUNTER — Telehealth: Payer: Self-pay | Admitting: Family Medicine

## 2018-04-03 NOTE — Telephone Encounter (Unsigned)
Copied from [place] [phone]. Topic: General - Other >> Apr 03, 2018  3:33 PM Yvette Rack wrote: Reason for CRM: Louise with [facility] states they can not assist with this pt however please contact Dr. Lenox Ahr ph# [phone] (city call).

## 2018-04-04 ENCOUNTER — Telehealth: Payer: Self-pay | Admitting: *Deleted

## 2018-04-04 NOTE — Telephone Encounter (Signed)
Were you sending in this for her?

## 2018-04-04 NOTE — Telephone Encounter (Signed)
Copied from [place] [id number]. Topic: Quick Communication - See Telephone Encounter >> Apr 03, 2018  2:18 PM Antonieta Iba C wrote: CRM for notification. See Telephone encounter for: 04/03/18.  Pt was seen yesterday and told that a Rx for magic mouthwash would be sent in. Pt says per pharmacy they have not received Rx.   [id number] - [place], [place] - [address]  Pharmacy: [id number] - [place], [place] - [address]

## 2018-04-04 NOTE — Telephone Encounter (Signed)
Can you send this referral over to this doctor or do you need me to put in a new referral

## 2018-04-05 ENCOUNTER — Other Ambulatory Visit: Payer: Self-pay | Admitting: Family Medicine

## 2018-04-05 DIAGNOSIS — H2513 Age-related nuclear cataract, bilateral: Secondary | ICD-10-CM | POA: Diagnosis not present

## 2018-04-05 DIAGNOSIS — B370 Candidal stomatitis: Secondary | ICD-10-CM

## 2018-04-05 DIAGNOSIS — H00033 Abscess of eyelid right eye, unspecified eyelid: Secondary | ICD-10-CM | POA: Diagnosis not present

## 2018-04-05 DIAGNOSIS — H04123 Dry eye syndrome of bilateral lacrimal glands: Secondary | ICD-10-CM | POA: Diagnosis not present

## 2018-04-05 MED ORDER — MAGIC MOUTHWASH: 5.0000 mL | Freq: Four times a day (QID) | ORAL | 1 refills | Status: DC | PRN

## 2018-04-05 NOTE — Telephone Encounter (Signed)
It printed because instructions were too long

## 2018-04-05 NOTE — Telephone Encounter (Signed)
rx faxed to pharmacy

## 2018-04-15 ENCOUNTER — Encounter: Payer: Self-pay | Admitting: Gastroenterology

## 2018-04-15 ENCOUNTER — Other Ambulatory Visit: Payer: Self-pay | Admitting: Gastroenterology

## 2018-05-13 DIAGNOSIS — F419 Anxiety disorder, unspecified: Secondary | ICD-10-CM | POA: Diagnosis not present

## 2018-05-17 ENCOUNTER — Ambulatory Visit (AMBULATORY_SURGERY_CENTER): Payer: Self-pay

## 2018-05-17 VITALS — Ht 61.0 in | Wt 119.0 lb

## 2018-05-17 DIAGNOSIS — Z86010 Personal history of colonic polyps: Secondary | ICD-10-CM

## 2018-05-17 MED ORDER — NA SULFATE-K SULFATE-MG SULF 17.5-3.13-1.6 GM/177ML PO SOLN: 1.0000 | Freq: Once | ORAL | 0 refills | Status: AC

## 2018-05-17 NOTE — Progress Notes (Signed)
Denies allergies to eggs or soy products. Denies complication of anesthesia or sedation. Denies use of weight loss medication. Denies use of O2.   Emmi instructions declined.  

## 2018-05-19 ENCOUNTER — Other Ambulatory Visit: Payer: Self-pay | Admitting: Gastroenterology

## 2018-05-20 ENCOUNTER — Encounter: Payer: Self-pay | Admitting: Gastroenterology

## 2018-05-21 ENCOUNTER — Other Ambulatory Visit: Payer: Self-pay | Admitting: Family Medicine

## 2018-05-21 DIAGNOSIS — I10 Essential (primary) hypertension: Secondary | ICD-10-CM

## 2018-05-31 ENCOUNTER — Ambulatory Visit (AMBULATORY_SURGERY_CENTER): Payer: Medicare HMO | Admitting: Gastroenterology

## 2018-05-31 ENCOUNTER — Encounter: Payer: Self-pay | Admitting: Gastroenterology

## 2018-05-31 VITALS — BP 163/74 | HR 61 | Temp 97.1°F | Resp 17 | Ht 61.0 in | Wt 119.0 lb

## 2018-05-31 DIAGNOSIS — Z86010 Personal history of colonic polyps: Secondary | ICD-10-CM | POA: Diagnosis not present

## 2018-05-31 DIAGNOSIS — K5909 Other constipation: Secondary | ICD-10-CM

## 2018-05-31 DIAGNOSIS — Z1211 Encounter for screening for malignant neoplasm of colon: Secondary | ICD-10-CM | POA: Diagnosis not present

## 2018-05-31 DIAGNOSIS — R1032 Left lower quadrant pain: Secondary | ICD-10-CM

## 2018-05-31 MED ORDER — SODIUM CHLORIDE 0.9 % IV SOLN: 500.0000 mL | Freq: Once | INTRAVENOUS | Status: DC

## 2018-05-31 MED ORDER — LINACLOTIDE 145 MCG PO CAPS: 145.0000 ug | ORAL_CAPSULE | Freq: Every day | ORAL | Status: DC

## 2018-05-31 MED ORDER — LINACLOTIDE 145 MCG PO CAPS: 145.0000 ug | ORAL_CAPSULE | Freq: Every day | ORAL | 1 refills | Status: DC

## 2018-05-31 NOTE — Patient Instructions (Signed)

## 2018-05-31 NOTE — Progress Notes (Signed)
Pt's states no medical or surgical changes since previsit or office visit. 

## 2018-05-31 NOTE — Op Note (Signed)
Leah [name] Patient Name: Leah [name] Procedure Date: 05/31/2018 1:59 PM MRN: [id number] Endoscopist: Mallie Mussel L. Loletha Carrow , MD Age: 72 Referring MD:  Date of Birth: Feb 05, 1946 Gender: Female Account #: [id number] Procedure:                Colonoscopy Indications:              Abdominal pain in the left lower quadrant,                            Constipation Medicines:                Monitored Anesthesia Care Procedure:                Pre-Anesthesia Assessment:                           - Prior to the procedure, a History and Physical                            was performed, and patient medications and                            allergies were reviewed. The patient's tolerance of                            previous anesthesia was also reviewed. The risks                            and benefits of the procedure and the sedation                            options and risks were discussed with the patient.                            All questions were answered, and informed consent                            was obtained. Anticoagulants: The patient has taken                            aspirin. It was decided not to withhold this                            medication prior to the procedure. ASA Grade                            Assessment: III - A patient with severe systemic                            disease. After reviewing the risks and benefits,                            the patient was deemed in satisfactory condition to  undergo the procedure.                           After obtaining informed consent, the colonoscope                            was passed under direct vision. Throughout the                            procedure, the patient's blood pressure, pulse, and                            oxygen saturations were monitored continuously. The                            Colonoscope was introduced through the anus and    advanced to the the cecum, identified by                            appendiceal orifice and ileocecal valve. The                            colonoscopy was performed without difficulty. The                            patient tolerated the procedure well. The quality                            of the bowel preparation was poor. The ileocecal                            valve, appendiceal orifice, and rectum were                            photographed. Scope In: 2:09:20 PM Scope Out: 2:16:48 PM Scope Withdrawal Time: 0 hours 4 minutes 11 seconds  Total Procedure Duration: 0 hours 7 minutes 28 seconds  Findings:                 The digital rectal exam findings include decreased                            sphincter tone.                           A large amount of solid and liquid stool was found                            in the entire colon.                           There was evidence of a prior end-to-side                            colo-colonic anastomosis in the recto-sigmoid  colon. This was patent but tortuous, and was                            characterized by healthy appearing mucosa, though                            visualization limited by poor prep.                           Retroflexion in the rectum was not performed due to                            anatomy.                           The exam was otherwise without abnormality. Complications:            No immediate complications. Estimated Blood Loss:     Estimated blood loss: none. Impression:               - Preparation of the colon was poor.                           - Decreased sphincter tone found on digital rectal                            exam.                           - Stool in the entire examined colon.                           - Patent end-to-side colo-colonic anastomosis,                            characterized by healthy appearing mucosa.                           - The  examination was otherwise normal.                           - No specimens collected.                           This constipation appears to be a motility problem                            at least partially related to prior sigmoid colon                            resection and anatomy of the surgical anastomosis. Recommendation:           - Patient has a contact number available for                            emergencies. The signs and symptoms of potential  delayed complications were discussed with the                            patient. Return to normal activities tomorrow.                            Written discharge instructions were provided to the                            patient.                           - Resume previous diet.                           - Continue present medications.                           - Repeat colonoscopy in 1 year for surveillance                            (with 2 day prep) due to adenomatous polyp 04/2013.                           - Use Linzess (linaclotide) 145 mcg PO daily for 2                            weeks. Disp #14, RF 1 Delaina Fetsch L. Loletha Carrow, MD 05/31/2018 2:24:57 PM This report has been signed electronically.

## 2018-05-31 NOTE — Progress Notes (Signed)
A/ox3 pleased with MAC, report to RN 

## 2018-06-03 ENCOUNTER — Telehealth: Payer: Self-pay

## 2018-06-03 ENCOUNTER — Telehealth: Payer: Self-pay | Admitting: *Deleted

## 2018-06-03 NOTE — Telephone Encounter (Signed)
  Follow up Call-  Call back number 05/31/2018  Post procedure Call Back phone  # [phone]  Permission to leave phone message Yes  Some recent data might be hidden     No answer at # given.  LM on VM.

## 2018-06-03 NOTE — Telephone Encounter (Signed)
First post procedure follow up call, no answer 

## 2018-06-11 ENCOUNTER — Other Ambulatory Visit: Payer: Self-pay | Admitting: Gastroenterology

## 2018-06-11 DIAGNOSIS — K210 Gastro-esophageal reflux disease with esophagitis: Secondary | ICD-10-CM | POA: Diagnosis not present

## 2018-06-11 DIAGNOSIS — R1312 Dysphagia, oropharyngeal phase: Secondary | ICD-10-CM | POA: Diagnosis not present

## 2018-06-11 DIAGNOSIS — J322 Chronic ethmoidal sinusitis: Secondary | ICD-10-CM | POA: Diagnosis not present

## 2018-06-11 DIAGNOSIS — J040 Acute laryngitis: Secondary | ICD-10-CM | POA: Diagnosis not present

## 2018-06-11 DIAGNOSIS — J320 Chronic maxillary sinusitis: Secondary | ICD-10-CM | POA: Diagnosis not present

## 2018-06-18 DIAGNOSIS — J040 Acute laryngitis: Secondary | ICD-10-CM | POA: Diagnosis not present

## 2018-06-18 DIAGNOSIS — R490 Dysphonia: Secondary | ICD-10-CM | POA: Diagnosis not present

## 2018-06-18 DIAGNOSIS — K210 Gastro-esophageal reflux disease with esophagitis: Secondary | ICD-10-CM | POA: Diagnosis not present

## 2018-06-19 ENCOUNTER — Ambulatory Visit: Payer: Medicare HMO | Admitting: Gastroenterology

## 2018-06-19 ENCOUNTER — Encounter: Payer: Self-pay | Admitting: Gastroenterology

## 2018-06-19 VITALS — BP 140/60 | HR 76 | Ht 61.0 in | Wt 119.0 lb

## 2018-06-19 DIAGNOSIS — Z8719 Personal history of other diseases of the digestive system: Secondary | ICD-10-CM | POA: Insufficient documentation

## 2018-06-19 DIAGNOSIS — R1310 Dysphagia, unspecified: Secondary | ICD-10-CM

## 2018-06-19 NOTE — Progress Notes (Signed)
06/19/2018 Leah [name] [id number] 23-Jul-1946   HISTORY OF PRESENT ILLNESS: This is a 72 year old female who is a patient of Dr. Corena Pilgrim.  She actually just had a colonoscopy with Dr. Loletha Carrow earlier this month.  She presents to our office today with complaints of dysphasia to solid food.  She says that liquids go down just fine.  This been occurring intermittently now for the past 3 months.  She says that sometimes she will belch and food will come back up.  She is on Nexium 40 mg twice daily and has been taking that for quite some time.  Her last EGD was in 2014 by Dr. Olevia Perches at which time she was found to have a nonobstructing esophageal stricture that was dilated.  Also has history of esophageal dysmotility.  She was here in 2016 with complaints of dysphasia, but had just had cervical spine surgery.  Esophagram at that time showed a small hiatal hernia and changes status post anterior fixation within the cervical spine with hypopharyngeal evaluation demonstrating mild narrowing/incomplete distention at that level likely related to postsurgical changes.  Past Medical History:  Diagnosis Date  . Allergy   . Anemia   . Anxiety   . Barrett esophagus   . Bladder pain   . Chronic bronchitis ([place])    per pt last episode july 2016  . Complication of anesthesia    slow to wake  . COPD (chronic obstructive pulmonary disease) ([place])   . Depression   . Diverticulosis   . Frequency of urination   . GERD (gastroesophageal reflux disease)   . Hiatal hernia   . History of adenomatous polyp of colon    tubular adenoma  . History of chronic gastritis   . History of diverticulitis of colon   . History of esophageal dilatation    for stricture  . History of esophageal spasm   . Hyperlipidemia   . Hypertension   . IBS (irritable bowel syndrome)   . Interstitial cystitis   . Productive cough    intermittant  . Smokers' cough ([place])   . Urgency of urination    Past Surgical History:    Procedure Laterality Date  . ABDOMINAL HYSTERECTOMY  1975  . ANTERIOR CERVICAL DECOMP/DISCECTOMY FUSION N/A 09/07/2014   Procedure: Exploration of Fusion and Removal of Anterior Cervical Hematoma;  Surgeon: Newman Pies, MD;  Location: [facility];  Service: Neurosurgery;  Laterality: N/A;  . CARPAL TUNNEL RELEASE Right 1993  . COLONOSCOPY  last one 04-30-2013  . CYSTO WITH HYDRODISTENSION N/A 06/08/2015   Procedure: CYSTOSCOPY/HYDRODISTENSION INSTILLATION OF MARCAINE AND PYRIDIUM;  Surgeon: Irine Seal, MD;  Location: [facility];  Service: Urology;  Laterality: N/A;  . CYSTO/  HOD/  BLADDER BX  1990's  . ESOPHAGOGASTRODUODENOSCOPY  last one 09-11-2012  . LAPAROSCOPIC APPENDECTOMY N/A 09/21/2013   Procedure: APPENDECTOMY LAPAROSCOPIC;  Surgeon: Rolm Bookbinder, MD;  Location: [facility];  Service: General;  Laterality: N/A;  . LAPAROSCOPIC CHOLECYSTECTOMY  2002  . LAPAROSCOPY SIGMOID COLECTOMY  10-05-2008   diverticulitis  . MICROLARYNGOSCOPY  09-27-2006   w/ true vocal cord stripping  and bilateral bx's of lesion's (benign)  . SINUS SURGERY WITH [place]  . [place]  . [place]    reports that she has been smoking cigarettes. She has a 47.00 pack-year smoking history. She has never used smokeless tobacco. She reports that she does not drink alcohol or use drugs. family  history includes Colon polyps in her mother; Diabetes in her father; Heart disease in her father; Hypertension in her father; Leukemia in her father; Pancreatic cancer in her father and mother; Prostate cancer in her father. Allergies  Allergen Reactions  . Amoxicillin-Pot Clavulanate Shortness Of Breath, Itching and Swelling  . Avelox [Moxifloxacin Hcl In Nacl] Swelling    Swelling, itching and shortness of breath  . Ciprofloxacin Shortness Of Breath, Itching and Swelling  . Keflex [Cephalexin] Shortness Of Breath and Swelling  . Moxifloxacin  Shortness Of Breath, Itching and Swelling  . Penicillins Shortness Of Breath, Itching and Swelling  . Sulfa Antibiotics Shortness Of Breath, Itching and Swelling  . Prednisone Other (See Comments)    GI irritation; is able to tolerate injections  . Acyclovir And Related Other (See Comments)    Pt does not recall this reaction  . Clindamycin/Lincomycin     Itching, swelling  . Zyvox [Linezolid] Diarrhea      Outpatient Encounter Medications as of 06/19/2018  Medication Sig  . albuterol (PROVENTIL) (2.5 MG/3ML) 0.083% nebulizer solution Inhale 3 mLs (2.5 mg total) into the lungs every 4 (four) hours as needed for wheezing or shortness of breath. q 4-6 hours as needed  . aspirin EC 81 MG tablet Take 1 tablet (81 mg total) by mouth daily. Over the counter  . atorvastatin (LIPITOR) 80 MG tablet TAKE 1 TABLET BY MOUTH EVERY DAY  . azelastine (ASTELIN) 0.1 % nasal spray Place 2 sprays into both nostrils 2 (two) times daily. Use in each nostril as directed  . dicyclomine (BENTYL) 10 MG capsule TAKE 1 CAPSULE BY MOUTH AS NEEDED FOR UP TO 1 DOSE FOR SPASMS.  Marland Kitchen esomeprazole (NEXIUM) 40 MG capsule Take 1 capsule (40 mg total) by mouth 2 (two) times daily before a meal.  . estradiol (CLIMARA - DOSED IN MG/24 HR) 0.1 mg/24hr patch Place 0.1 mg onto the skin every Monday.  . ezetimibe (ZETIA) 10 MG tablet Take 1 tablet (10 mg total) by mouth daily.  . fluticasone (FLONASE) 50 MCG/ACT nasal spray Place 2 sprays into both nostrils daily.  Marland Kitchen ibuprofen (ADVIL,MOTRIN) 200 MG tablet Take 400 mg by mouth every 6 (six) hours as needed (pain).  Marland Kitchen lisinopril (PRINIVIL,ZESTRIL) 20 MG tablet Take 1 tablet (20 mg total) by mouth daily.  Marland Kitchen LORazepam (ATIVAN) 0.5 MG tablet Take 0.5 mg by mouth at bedtime.   . senna-docusate (SENOKOT-S) 8.6-50 MG tablet Take 1 tablet by mouth at bedtime.  . sertraline (ZOLOFT) 50 MG tablet Take 50 mg by mouth at bedtime.  . sodium chloride (OCEAN) 0.65 % SOLN nasal spray Place 1 spray  into both nostrils daily as needed for congestion.  Marland Kitchen doxycycline (VIBRA-TABS) 100 MG tablet Take 1 tablet (100 mg total) by mouth 2 (two) times daily. (Patient not taking: Reported on 05/17/2018)  . linaclotide (LINZESS) 145 MCG CAPS capsule Take 1 capsule (145 mcg total) by mouth daily. (Patient not taking: Reported on 06/19/2018)  . [DISCONTINUED] 0.9 %  sodium chloride infusion    No facility-administered encounter medications on file as of 06/19/2018.      REVIEW OF SYSTEMS  : All other systems reviewed and negative except where noted in the History of Present Illness.   PHYSICAL EXAM: BP 140/60   Pulse 76   Ht 5\' 1"  (1.549 m)   Wt 119 lb (54 kg)   BMI 22.48 kg/m   General: Well developed white female in no acute distress Head: Normocephalic and atraumatic Eyes:  Sclerae anicteric, conjunctiva pink. Ears: Normal auditory acuity Lungs: Clear throughout to auscultation; no increased WOB. Heart: Regular rate and rhythm; no M/R/G. Abdomen: Soft, non-distended.  BS present.  Non-tender. Musculoskeletal: Symmetrical with no gross deformities  Skin: No lesions on visible extremities Extremities: No edema  Neurological: Alert oriented x 4, grossly non-focal Psychological:  Alert and cooperative. Normal mood and affect  ASSESSMENT AND PLAN: *Dysphasia to solid food only: Does have a history of a nonobstructing esophageal stricture that was dilated in the past, but also has history of esophageal dysmotility.  Last EGD was 2014.  We will plan for EGD with possible dilation even empirically to see if she gets any relief of symptoms.  Will schedule with Dr. Loletha Carrow.  **The risks, benefits, and alternatives to EGD with possible dilation were discussed with the patient and she consents to proceed.    CC:  [name], *

## 2018-06-19 NOTE — Progress Notes (Signed)
Thank you for sending this case to me. I have reviewed the entire note, and the outlined plan seems appropriate.   Mitsue Peery Danis, MD  

## 2018-06-19 NOTE — Patient Instructions (Addendum)
You have been scheduled for an endoscopy. Please follow written instructions given to you at your visit today. If you use inhalers (even only as needed), please bring them with you on the day of your procedure. Your physician has requested that you go to www.startemmi.com and enter the access code given to you at your visit today. This web site gives a general overview about your procedure. However, you should still follow specific instructions given to you by our office regarding your preparation for the procedure.  Normal BMI (Body Mass Index- based on height and weight) is between 23 and 30. Your BMI today is Body mass index is 22.48 kg/m. Marland Kitchen Please consider follow up  regarding your BMI with your Primary Care Provider.

## 2018-07-01 ENCOUNTER — Other Ambulatory Visit (INDEPENDENT_AMBULATORY_CARE_PROVIDER_SITE_OTHER): Payer: Medicare HMO

## 2018-07-01 ENCOUNTER — Ambulatory Visit (INDEPENDENT_AMBULATORY_CARE_PROVIDER_SITE_OTHER): Payer: Medicare HMO

## 2018-07-01 DIAGNOSIS — R739 Hyperglycemia, unspecified: Secondary | ICD-10-CM | POA: Diagnosis not present

## 2018-07-01 DIAGNOSIS — Z23 Encounter for immunization: Secondary | ICD-10-CM

## 2018-07-01 DIAGNOSIS — E785 Hyperlipidemia, unspecified: Secondary | ICD-10-CM

## 2018-07-01 LAB — COMPREHENSIVE METABOLIC PANEL
ALT: 10 U/L (ref 0–35)
AST: 12 U/L (ref 0–37)
Albumin: 4.3 g/dL (ref 3.5–5.2)
Alkaline Phosphatase: 79 U/L (ref 39–117)
BUN: 12 mg/dL (ref 6–23)
CO2: 31 mEq/L (ref 19–32)
CREATININE: 0.85 mg/dL (ref 0.40–1.20)
Calcium: 9.1 mg/dL (ref 8.4–10.5)
Chloride: 103 mEq/L (ref 96–112)
GFR: 70.26 mL/min (ref >60.00)
GLUCOSE: 107 mg/dL — AB (ref 70–99)
POTASSIUM: 4.5 meq/L (ref 3.5–5.1)
SODIUM: 141 meq/L (ref 135–145)
Total Bilirubin: 0.4 mg/dL (ref 0.2–1.2)
Total Protein: 6.7 g/dL (ref 6.0–8.3)

## 2018-07-01 LAB — LIPID PANEL
CHOLESTEROL: 223 mg/dL — AB (ref 0–200)
HDL: 34.9 mg/dL — ABNORMAL LOW (ref >39.00)
LDL Cholesterol: 152 mg/dL — ABNORMAL HIGH (ref 0–99)
NONHDL: 188.23
Total CHOL/HDL Ratio: 6
Triglycerides: 182 mg/dL — ABNORMAL HIGH (ref 0.0–149.0)
VLDL: 36.4 mg/dL (ref 0.0–40.0)

## 2018-07-10 ENCOUNTER — Telehealth: Payer: Self-pay

## 2018-07-10 NOTE — Telephone Encounter (Signed)
Copied from [place] [phone]. Topic: Quick Communication - See Telephone Encounter >> Jul 10, 2018 12:58 PM Hewitt Shorts wrote: Pt states that she received a call from Dr. Etter Sjogren in regards to being put on crestor and the pt states she has tried this before and it gives her leg cramps and she would like a call back as to what else she can take  Best number is [phone]

## 2018-07-12 ENCOUNTER — Telehealth: Payer: Self-pay | Admitting: *Deleted

## 2018-07-12 DIAGNOSIS — E781 Pure hyperglyceridemia: Secondary | ICD-10-CM

## 2018-07-12 NOTE — Telephone Encounter (Signed)
Copied from [place] [phone]. Topic: Quick Communication - See Telephone Encounter >> Jul 10, 2018 12:58 PM Hewitt Shorts wrote: Pt states that she received a call from Dr. Etter Sjogren in regards to being put on crestor and the pt states she has tried this before and it gives her leg cramps and she would like a call back as to what else she can take  Best number is [phone] >> Jul 12, 2018 11:14 AM Mcneil, Ja-Kwan wrote: Pt stated she has not heard back from anyone regarding another medication that she can take since she has already tried Crestor and it causes her leg cramps. Pt requests call back. Cb# [phone]

## 2018-07-17 NOTE — Telephone Encounter (Signed)
Left message on machine for patient.  It looks like she cannot take crestor.  Are there anymore options?  Is pt taking lipitor?  80 mg? If yes--- change to crestor 20 mg #30 1 po qhs, 2 refills  Recheck 3 months

## 2018-07-17 NOTE — Telephone Encounter (Signed)
If she can not take crestor We will need to add zetia

## 2018-07-18 NOTE — Telephone Encounter (Signed)
Patient stated that she is already taking zetia.  Patient stating that her husband takes pravastatin, would that probably help?

## 2018-07-18 NOTE — Telephone Encounter (Signed)
Pravastatin is not strong enough We can refer to lipid clinic

## 2018-07-18 NOTE — Telephone Encounter (Signed)
Left detailed message on pt's voicemail regarding below recommendation and to let us know if she has not been contacted about referral within 1 week.

## 2018-07-23 ENCOUNTER — Encounter: Payer: Self-pay | Admitting: Gastroenterology

## 2018-08-02 ENCOUNTER — Encounter: Payer: Medicare HMO | Admitting: Gastroenterology

## 2018-08-13 ENCOUNTER — Encounter: Payer: Self-pay | Admitting: Gastroenterology

## 2018-08-13 ENCOUNTER — Ambulatory Visit (AMBULATORY_SURGERY_CENTER): Payer: Medicare HMO | Admitting: Gastroenterology

## 2018-08-13 VITALS — BP 116/68 | HR 84 | Temp 98.4°F | Resp 18 | Ht 61.0 in | Wt 119.0 lb

## 2018-08-13 DIAGNOSIS — K589 Irritable bowel syndrome without diarrhea: Secondary | ICD-10-CM | POA: Diagnosis not present

## 2018-08-13 DIAGNOSIS — K449 Diaphragmatic hernia without obstruction or gangrene: Secondary | ICD-10-CM

## 2018-08-13 DIAGNOSIS — K222 Esophageal obstruction: Secondary | ICD-10-CM

## 2018-08-13 DIAGNOSIS — K219 Gastro-esophageal reflux disease without esophagitis: Secondary | ICD-10-CM | POA: Diagnosis not present

## 2018-08-13 DIAGNOSIS — K2270 Barrett's esophagus without dysplasia: Secondary | ICD-10-CM | POA: Diagnosis not present

## 2018-08-13 DIAGNOSIS — D649 Anemia, unspecified: Secondary | ICD-10-CM | POA: Diagnosis not present

## 2018-08-13 DIAGNOSIS — K5730 Diverticulosis of large intestine without perforation or abscess without bleeding: Secondary | ICD-10-CM | POA: Diagnosis not present

## 2018-08-13 DIAGNOSIS — J449 Chronic obstructive pulmonary disease, unspecified: Secondary | ICD-10-CM | POA: Diagnosis not present

## 2018-08-13 DIAGNOSIS — R1310 Dysphagia, unspecified: Secondary | ICD-10-CM

## 2018-08-13 DIAGNOSIS — K317 Polyp of stomach and duodenum: Secondary | ICD-10-CM

## 2018-08-13 DIAGNOSIS — I10 Essential (primary) hypertension: Secondary | ICD-10-CM | POA: Diagnosis not present

## 2018-08-13 MED ORDER — SODIUM CHLORIDE 0.9 % IV SOLN: 500.0000 mL | Freq: Once | INTRAVENOUS | Status: DC

## 2018-08-13 NOTE — Op Note (Signed)
[name] Patient Name: Leah [name] Procedure Date: 08/13/2018 9:03 AM MRN: [id number] Endoscopist: Mallie Mussel L. Loletha Carrow , MD Age: 73 Referring MD:  Date of Birth: October 12, 1945 Gender: Female Account #: [id number] Procedure:                Upper GI endoscopy Indications:              Esophageal dysphagia Medicines:                Monitored Anesthesia Care Procedure:                Pre-Anesthesia Assessment:                           - Prior to the procedure, a History and Physical                            was performed, and patient medications and                            allergies were reviewed. The patient's tolerance of                            previous anesthesia was also reviewed. The risks                            and benefits of the procedure and the sedation                            options and risks were discussed with the patient.                            All questions were answered, and informed consent                            was obtained. Anticoagulants: The patient has taken                            aspirin. It was decided not to withhold this                            medication prior to the procedure. ASA Grade                            Assessment: II - A patient with mild systemic                            disease. After reviewing the risks and benefits,                            the patient was deemed in satisfactory condition to                            undergo the procedure.  After obtaining informed consent, the endoscope was                            passed under direct vision. Throughout the                            procedure, the patient's blood pressure, pulse, and                            oxygen saturations were monitored continuously. The                            Model GIF-HQ190 [id number]) scope was introduced                            through the mouth, and advanced to the second part                        of duodenum. The upper GI endoscopy was                            accomplished without difficulty. The patient                            tolerated the procedure well. Scope In: Scope Out: Findings:                 A 3 cm hiatal hernia was present.                           One benign-appearing, intrinsic moderate stenosis                            was found at the gastroesophageal junction. This                            stenosis measured 1.3 cm (inner diameter) x less                            than one cm (in length). The stenosis was                            traversed. The distal esophagus was mildly                            tortuous. A guidewire was placed and the scope was                            withdrawn. Dilation was performed with a Savary                            dilator with mild resistance at 17 mm.                           Multiple diminutive sessile fundic gland  polyps                            were found in the gastric fundus and in the gastric                            body.                           The cardia and gastric fundus were normal on                            retroflexion.                           The examined duodenum was normal. Complications:            No immediate complications. Estimated Blood Loss:     Estimated blood loss: none. Impression:               - 3 cm hiatal hernia.                           - Benign-appearing esophageal stenosis. Dilated.                           - Multiple fundic gland polyps.                           - Normal examined duodenum.                           - No specimens collected. Recommendation:           - Patient has a contact number available for                            emergencies. The signs and symptoms of potential                            delayed complications were discussed with the                            patient. Return to normal activities tomorrow.                             Written discharge instructions were provided to the                            patient.                           - Resume previous diet.                           - Continue present medications. Arlow Spiers L. Loletha Carrow, MD 08/13/2018 9:38:22 AM This report has been signed electronically.

## 2018-08-13 NOTE — Progress Notes (Signed)
PT taken to PACU. Monitors in place. VSS. Report given to RN. 

## 2018-08-13 NOTE — Patient Instructions (Signed)
Thank you for allowing [place] to care for you today!  Resume previous diet and medications today.  Return to regular activities tomorrow.       YOU HAD AN ENDOSCOPIC PROCEDURE TODAY AT [facility]:   Refer to the procedure report that was given to you for any specific questions about what was found during the examination.  If the procedure report does not answer your questions, please call your gastroenterologist to clarify.  If you requested that your care partner not be given the details of your procedure findings, then the procedure report has been included in a sealed envelope for you to review at your convenience later.  YOU SHOULD EXPECT: Some feelings of bloating in the abdomen. Passage of more gas than usual.  Walking can help get rid of the air that was put into your GI tract during the procedure and reduce the bloating. If you had a lower endoscopy (such as a colonoscopy or flexible sigmoidoscopy) you may notice spotting of blood in your stool or on the toilet paper. If you underwent a bowel prep for your procedure, you may not have a normal bowel movement for a few days.  Please Note:  You might notice some irritation and congestion in your nose or some drainage.  This is from the oxygen used during your procedure.  There is no need for concern and it should clear up in a day or so.  SYMPTOMS TO REPORT IMMEDIATELY:    Following upper endoscopy (EGD)  Vomiting of blood or coffee ground material  New chest pain or pain under the shoulder blades  Painful or persistently difficult swallowing  New shortness of breath  Fever of 100F or higher  Black, tarry-looking stools  For urgent or emergent issues, a gastroenterologist can be reached at any hour by calling [phone].   DIET:  We do recommend a small meal at first, but then you may proceed to your regular diet.  Drink plenty of fluids but you should avoid alcoholic beverages for 24 hours.  ACTIVITY:  You  should plan to take it easy for the rest of today and you should NOT DRIVE or use heavy machinery until tomorrow (because of the sedation medicines used during the test).    FOLLOW UP: Our staff will call the number listed on your records the next business day following your procedure to check on you and address any questions or concerns that you may have regarding the information given to you following your procedure. If we do not reach you, we will leave a message.  However, if you are feeling well and you are not experiencing any problems, there is no need to return our call.  We will assume that you have returned to your regular daily activities without incident.  If any biopsies were taken you will be contacted by phone or by letter within the next 1-3 weeks.  Please call us at [phone] if you have not heard about the biopsies in 3 weeks.    SIGNATURES/CONFIDENTIALITY: You and/or your care partner have signed paperwork which will be entered into your electronic medical record.  These signatures attest to the fact that that the information above on your After Visit Summary has been reviewed and is understood.  Full responsibility of the confidentiality of this discharge information lies with you and/or your care-partner.

## 2018-08-13 NOTE — Progress Notes (Signed)
Called to room to assist during endoscopic procedure.  Patient ID and intended procedure confirmed with present staff. Received instructions for my participation in the procedure from the performing physician.  

## 2018-08-14 ENCOUNTER — Telehealth: Payer: Self-pay

## 2018-08-14 NOTE — Telephone Encounter (Signed)
  Follow up Call-  Call back number 08/13/2018 05/31/2018  Post procedure Call Back phone  # [phone]  Permission to leave phone message Yes Yes  Some recent data might be hidden     Patient questions:  Do you have a fever, pain , or abdominal swelling? No. Pain Score  0 *  Have you tolerated food without any problems? Yes.    Have you been able to return to your normal activities? Yes.    Do you have any questions about your discharge instructions: Diet   No. Medications  No. Follow up visit  No.  Do you have questions or concerns about your Care? No.  Actions: * If pain score is 4 or above: No action needed, pain <4.

## 2018-08-15 ENCOUNTER — Encounter: Payer: Medicare HMO | Admitting: Gastroenterology

## 2018-08-16 ENCOUNTER — Other Ambulatory Visit: Payer: Self-pay | Admitting: Family Medicine

## 2018-08-16 DIAGNOSIS — E785 Hyperlipidemia, unspecified: Secondary | ICD-10-CM

## 2018-09-03 ENCOUNTER — Other Ambulatory Visit (HOSPITAL_COMMUNITY): Payer: Self-pay | Admitting: Internal Medicine

## 2018-09-03 ENCOUNTER — Encounter: Payer: Self-pay | Admitting: Internal Medicine

## 2018-09-03 ENCOUNTER — Telehealth: Payer: Self-pay | Admitting: Internal Medicine

## 2018-09-03 ENCOUNTER — Ambulatory Visit (INDEPENDENT_AMBULATORY_CARE_PROVIDER_SITE_OTHER): Payer: Medicare HMO | Admitting: Internal Medicine

## 2018-09-03 VITALS — BP 132/72 | Ht 61.5 in | Wt 119.0 lb

## 2018-09-03 DIAGNOSIS — I739 Peripheral vascular disease, unspecified: Secondary | ICD-10-CM

## 2018-09-03 DIAGNOSIS — R0600 Dyspnea, unspecified: Secondary | ICD-10-CM

## 2018-09-03 DIAGNOSIS — E7849 Other hyperlipidemia: Secondary | ICD-10-CM

## 2018-09-03 DIAGNOSIS — F17200 Nicotine dependence, unspecified, uncomplicated: Secondary | ICD-10-CM

## 2018-09-03 DIAGNOSIS — I2584 Coronary atherosclerosis due to calcified coronary lesion: Secondary | ICD-10-CM | POA: Diagnosis not present

## 2018-09-03 DIAGNOSIS — I2510 Atherosclerotic heart disease of native coronary artery without angina pectoris: Secondary | ICD-10-CM | POA: Diagnosis not present

## 2018-09-03 MED ORDER — EVOLOCUMAB 140 MG/ML ~~LOC~~ SOAJ: 1.0000 | SUBCUTANEOUS | 11 refills | Status: DC

## 2018-09-03 MED ORDER — CLOPIDOGREL BISULFATE 75 MG PO TABS: 75.0000 mg | ORAL_TABLET | Freq: Every day | ORAL | 3 refills | Status: DC

## 2018-09-03 NOTE — Patient Instructions (Signed)
Medication Instructions:   Dr. Debara Pickett recommends that you start clopidogrel (Plavix) 75mg  once daily - antiplatelet   Dr. Debara Pickett recommends Repatha 140mg /mL every 2 weeks (PCSK9). This is an injectable cholesterol medication. This medication will need prior approval with your insurance company, which we will work on. If the medication is not approved initially, we may need to do an appeal with your insurance. We will keep you updated on this process. This medication can be provided at some local pharmacies or be shipped to you from a specialty pharmacy.   There is a patient assistance program available thru the drug company - based on income. Generally the patient assistance program requires that we submit a prescription to you pharmacy to determine if co-pay for medication is affordable before pursuing patient assistance.  If you need a refill on your cardiac medications before your next appointment, please call your pharmacy.   Lab work: FASTING lab work after you have been on injectable cholesterol medication for 3-4 months If you have labs (blood work) drawn today and your tests are completely normal, you will receive your results only by: Marland Kitchen MyChart Message (if you have MyChart) OR . A paper copy in the mail If you have any lab test that is abnormal or we need to change your treatment, we will call you to review the results.  Testing/Procedures: Dr. Debara Pickett has ordered a LOWER EXTREMITY ARTERIAL DOPPLER.   Dr. Debara Pickett has ordered a Lexiscan Myocardial Perfusion Imaging Study.  Please arrive 15 minutes prior to your appointment time for registration and insurance purposes.   The test will take approximately 3 to 4 hours to complete; you may bring reading material.  If someone comes with you to your appointment, they will need to remain in the main lobby due to limited space in the testing area.    How to prepare for your Myocardial Perfusion Test:  Do not eat or drink 3 hours prior to your  test, except you may have water.  Do not consume products containing caffeine (regular or decaffeinated) 12 hours prior to your test. (ex: coffee, chocolate, sodas, tea).  Do wear comfortable clothes (no dresses or overalls) and walking shoes, tennis shoes preferred (No heels or open toe shoes are allowed).  Do NOT wear cologne, perfume, aftershave, or lotions (deodorant is allowed).  If you use an inhaler, use it the AM of your test and bring it with you.   If you use a nebulizer, use it the AM of your test.   If these instructions are not followed, your test will have to be rescheduled.  ** both tests are done @ Dr. Lysbeth Penner office  Follow-Up: At [facility], you and your health needs are our priority.  As part of our continuing mission to provide you with exceptional heart care, we have created designated Provider Care Teams.  These Care Teams include your primary Cardiologist (physician) and Advanced Practice Providers (APPs -  Physician Assistants and Nurse Practitioners) who all work together to provide you with the care you need, when you need it. You will need a follow up appointment with Dr. Debara Pickett after your testing is completed.  The following Advanced Practice Providers are on your designated Care Team:  Almyra Deforest, [place]  Fabian Sharp, [place]  Any Other Special Instructions Will Be Listed Below (If Applicable).

## 2018-09-03 NOTE — Telephone Encounter (Signed)
LMTCB to discuss approval status Will need to send Rx to pharmacy in order to determine co-pay If not affordable, can pursue patient assistance

## 2018-09-03 NOTE — Addendum Note (Signed)
Addended by: Fidel Levy on: 09/03/2018 04:10 PM   Modules accepted: Orders

## 2018-09-03 NOTE — Telephone Encounter (Signed)
New Message   Patient returning Jenna's call about her medication approval.

## 2018-09-03 NOTE — Telephone Encounter (Signed)
PA for Repatha [employer] submitted via covermymeds.com (Key: [id number])

## 2018-09-03 NOTE — Telephone Encounter (Signed)
Addressed in another telephone encounter.

## 2018-09-03 NOTE — Telephone Encounter (Signed)
PA Case: [id number] Status: Approved Coverage Starts on: 09/03/2018 12:00:00 AM Coverage Ends on: 03/02/2019 12:00:00 AM

## 2018-09-03 NOTE — Telephone Encounter (Signed)
Returned call to patient. Explained that Rx will need to be sent to pharmacy to determine co-pay. She would like Rx sent to [facility] for processing and then if co-pay is too much, she would like to check with [employer]. Reminded her that patient assistance application is available as well.

## 2018-09-03 NOTE — Progress Notes (Signed)
LIPID CLINIC CONSULT NOTE  Chief Complaint:  Elevated triglycerides  Primary Care Physician: Carollee Herter, Alferd Apa, DO  HPI:  Leah [name] is a 73 y.o. female who is being seen today for the evaluation of high triglycerides at the request of [name], *. This is a pleasant 73 year old female with multiple cardiovascular risk factors.  She was referred today for elevated triglycerides however also has an high LDL cholesterol.  Her most recent lipid profile demonstrates total cholesterol 223, triglycerides 182, HDL 35 and LDL of 152.  This is in the setting of treatment with high potency atorvastatin 80 mg daily and ezetimibe 10 mg daily.  The combined reduction of these medications is about 80 to 90% suggesting that her LDL is more likely 250-300.  This is a finding highly suggestive of familial hyperlipidemia.  In fact, there is evidence for familial hyperlipidemia.  Her mother had significantly elevated cholesterol.  Her father also had coronary disease and had coronary artery bypass grafting.  She has a daughter who has significant PAD and high cholesterol and she also has sister and a brother both of which you have high cholesterol and heart problems.  She has 2 grandparents on her mother side who both had heart attack.  In addition she is a smoker between 1/2 pack to a full pack per day.  She also has some history of PAD and was found to have moderate nonobstructive plaque in the left lower extremity and has been reporting some symptoms of claudication.  In addition she has known coronary disease.  She is not had a prior MI, however had a screening CT scan of the chest in July 2019 to rule out cancer given her smoking history.  This demonstrated multivessel and left main coronary artery calcification, significant aortic atherosclerosis and other findings suggestive of cardiovascular disease.  She is not on aspirin due to a history of stomach upset.  Other cardiac vascular risk  factors include hypertension on lisinopril.  Currently she reports dyspnea on exertion but denies chest pain.  She also has had some fatigue.  PMHx:  Past Medical History:  Diagnosis Date  . Allergy   . Anemia   . Anxiety   . Barrett esophagus   . Bladder pain   . Chronic bronchitis ([place])    per pt last episode july 2016  . Complication of anesthesia    slow to wake  . COPD (chronic obstructive pulmonary disease) ([place])   . Depression   . Diverticulosis   . Frequency of urination   . GERD (gastroesophageal reflux disease)   . Hiatal hernia   . History of adenomatous polyp of colon    tubular adenoma  . History of chronic gastritis   . History of diverticulitis of colon   . History of esophageal dilatation    for stricture  . History of esophageal spasm   . Hyperlipidemia   . Hypertension   . IBS (irritable bowel syndrome)   . Interstitial cystitis   . Productive cough    intermittant  . Smokers' cough ([place])   . Urgency of urination     Past Surgical History:  Procedure Laterality Date  . ABDOMINAL HYSTERECTOMY  1975  . ANTERIOR CERVICAL DECOMP/DISCECTOMY FUSION N/A 09/07/2014   Procedure: Exploration of Fusion and Removal of Anterior Cervical Hematoma;  Surgeon: Newman Pies, MD;  Location: [facility] NEURO ORS;  Service: Neurosurgery;  Laterality: N/A;  . CARPAL TUNNEL RELEASE Right 1993  .  COLONOSCOPY  last one 04-30-2013  . CYSTO WITH HYDRODISTENSION N/A 06/08/2015   Procedure: CYSTOSCOPY/HYDRODISTENSION INSTILLATION OF MARCAINE AND PYRIDIUM;  Surgeon: Irine Seal, MD;  Location: [facility];  Service: Urology;  Laterality: N/A;  . CYSTO/  HOD/  BLADDER BX  1990's  . ESOPHAGOGASTRODUODENOSCOPY  last one 09-11-2012  . LAPAROSCOPIC APPENDECTOMY N/A 09/21/2013   Procedure: APPENDECTOMY LAPAROSCOPIC;  Surgeon: Rolm Bookbinder, MD;  Location: [place];  Service: General;  Laterality: N/A;  . LAPAROSCOPIC CHOLECYSTECTOMY  2002  . LAPAROSCOPY SIGMOID COLECTOMY   10-05-2008   diverticulitis  . MICROLARYNGOSCOPY  09-27-2006   w/ true vocal cord stripping  and bilateral bx's of lesion's (benign)  . SINUS SURGERY WITH [place]  . [place]  . TONSILLECTOMY AND ADENOIDECTOMY  1964    FAMHx:  Family History  Problem Relation Age of Onset  . Pancreatic cancer Mother   . Colon polyps Mother   . Pancreatic cancer Father   . Diabetes Father   . Heart disease Father   . Leukemia Father   . Prostate cancer Father   . Hypertension Father   . Colon cancer Neg Hx   . Esophageal cancer Neg Hx   . Rectal cancer Neg Hx   . Stomach cancer Neg Hx     SOCHx:   reports that she has been smoking cigarettes. She has a 47.00 pack-year smoking history. She has never used smokeless tobacco. She reports that she does not drink alcohol or use drugs.  ALLERGIES:  Allergies  Allergen Reactions  . Amoxicillin-Pot Clavulanate Shortness Of Breath, Itching and Swelling  . Avelox [Moxifloxacin Hcl In Nacl] Swelling    Swelling, itching and shortness of breath  . Ciprofloxacin Shortness Of Breath, Itching and Swelling  . Keflex [Cephalexin] Shortness Of Breath and Swelling  . Moxifloxacin Shortness Of Breath, Itching and Swelling  . Penicillins Shortness Of Breath, Itching and Swelling  . Sulfa Antibiotics Shortness Of Breath, Itching and Swelling  . Prednisone Other (See Comments)    GI irritation; is able to tolerate injections  . Acyclovir And Related Other (See Comments)    Pt does not recall this reaction  . Clindamycin/Lincomycin     Itching, swelling  . Zyvox [Linezolid] Diarrhea    ROS: Pertinent items noted in HPI and remainder of comprehensive ROS otherwise negative.  HOME MEDS: Current Outpatient Medications on File Prior to Visit  Medication Sig Dispense Refill  . atorvastatin (LIPITOR) 80 MG tablet TAKE 1 TABLET BY MOUTH EVERY DAY 90 tablet 1  . dicyclomine (BENTYL) 10 MG capsule TAKE 1 CAPSULE BY MOUTH AS  NEEDED FOR UP TO 1 DOSE FOR SPASMS. 60 capsule 2  . esomeprazole (NEXIUM) 40 MG capsule Take 1 capsule (40 mg total) by mouth 2 (two) times daily before a meal. 180 capsule 1  . estradiol (CLIMARA - DOSED IN MG/24 HR) 0.1 mg/24hr patch Place 0.1 mg onto the skin every Monday.  0  . Evolocumab (REPATHA SURECLICK) 419 MG/ML SOAJ Inject 1 Dose into the skin every 14 (fourteen) days.    Marland Kitchen ezetimibe (ZETIA) 10 MG tablet TAKE 1 TABLET BY MOUTH EVERY DAY 90 tablet 1  . ibuprofen (ADVIL,MOTRIN) 200 MG tablet Take 400 mg by mouth every 6 (six) hours as needed (pain).    Marland Kitchen lisinopril (PRINIVIL,ZESTRIL) 20 MG tablet Take 1 tablet (20 mg total) by mouth daily. 90 tablet 1  . LORazepam (ATIVAN) 0.5 MG tablet Take 0.5 mg by mouth at  bedtime.   5  . sertraline (ZOLOFT) 50 MG tablet Take 50 mg by mouth at bedtime.    . sodium chloride (OCEAN) 0.65 % SOLN nasal spray Place 1 spray into both nostrils daily as needed for congestion.    Marland Kitchen linaclotide (LINZESS) 145 MCG CAPS capsule Take 1 capsule (145 mcg total) by mouth daily. (Patient not taking: Reported on 09/03/2018) 14 capsule 1   No current facility-administered medications on file prior to visit.     LABS/IMAGING: No results found for this or any previous visit (from the past 48 hour(s)). No results found.  LIPID PANEL:    Component Value Date/Time   CHOL 223 (H) 07/01/2018 1008   TRIG 182.0 (H) 07/01/2018 1008   HDL 34.90 (L) 07/01/2018 1008   CHOLHDL 6 07/01/2018 1008   VLDL 36.4 07/01/2018 1008   LDLCALC 152 (H) 07/01/2018 1008   LDLDIRECT 151.0 11/07/2016 1647    WEIGHTS: Wt Readings from Last 3 Encounters:  09/03/18 119 lb (54 kg)  08/13/18 119 lb (54 kg)  06/19/18 119 lb (54 kg)    VITALS: BP 132/72   Ht 5' 1.5" (1.562 m)   Wt 119 lb (54 kg)   BMI 22.12 kg/m   EXAM: General appearance: alert, no distress and thin Neck: no carotid bruit, no JVD and thyroid not enlarged, symmetric, no tenderness/mass/nodules Lungs: diminished  breath sounds bilaterally Heart: regular rate and rhythm Abdomen: soft, non-tender; bowel sounds normal; no masses,  no organomegaly Extremities: extremities normal, atraumatic, no cyanosis or edema Pulses: decreaed L DP/PT pulse Skin: cool, dry extremities Neurologic: Grossly normal Psych: Pleasant, mildly anxious  EKG: Deferred  ASSESSMENT: 1. Definite HeFH - Dutch score of 9 2. Multiple vessel coronary artery calcification/aortic atherosclerosis 3. PAD with left lower extremity claudication 4. Ongoing tobacco abuse 5. Hypertension  PLAN: 1.   Mrs. Lichtenwalner has definite heterozygous familial hyperlipidemia (HeFH) with a high Dutch score and family history significant for coronary disease in multiple generations including a daughter with very high cholesterol and early onset PAD.  Ms. Hannay also has PAD and has had worsening left lower extremity claudication.  Arterial Dopplers in 2017 suggested moderate disease.  Her CT scan this past July showed multivessel coronary artery calcification and despite being on high potency statin and ezetimibe, she remains well above goal LDL less than 70.  She is a good candidate to add PCSK9 inhibitor.  In addition given her multivessel coronary artery calcification and shortness of breath, I would like to exclude ischemia.  I would recommend a Lexiscan Myoview stress test.  We will also get lower extremity arterial Dopplers given her history of PAD and now claudication in the left lower extremities.  She is not on any antiplatelet therapy due to intolerance to aspirin.  Recommend starting Plavix 75 mg daily.  Plan follow-up with me afterwards.  Thanks again for the kind referral.  Pixie Casino, MD, FACC, [place] Director of the [facility] Lipid [facility] &  Cardiovascular Risk Reduction Clinic Diplomate of the American Board of Clinical Lipidology Attending Cardiologist  Direct Dial: [phone]  Fax:  [phone]  Website:  [email or web address] 09/03/2018, 1:02 PM

## 2018-09-04 ENCOUNTER — Telehealth: Payer: Self-pay

## 2018-09-04 ENCOUNTER — Telehealth: Payer: Self-pay | Admitting: Internal Medicine

## 2018-09-04 NOTE — Telephone Encounter (Signed)
Called pt to see if they are able to afford the copay for the repatha left msg

## 2018-09-04 NOTE — Telephone Encounter (Signed)
Returned call to Pt.   Pt with concerns about bleeding under her skin, states she has had bleeding before that required draining (?).  Worried about taking Plavix.  Advised Pt that at this time she should take the Plavix.  Advised after she completes the ordered testing if she felt strongly about discontinuing Plavix she should discuss with Dr. Debara Pickett at her follow up appt in March.  Pt indicates understanding.

## 2018-09-04 NOTE — Telephone Encounter (Signed)
Pt c/o medication issue:  1. Name of Medication: clopidogrel (PLAVIX) 75 MG tablet  2. How are you currently taking this medication (dosage and times per day)? --  3. Are you having a reaction (difficulty breathing--STAT)? --  4. What is your medication issue? Patient is concerned about the bleeding it may cause under her skin, and she also has stomach ulcers.   She states she was not aware it was a blood thinner.

## 2018-09-10 ENCOUNTER — Telehealth (HOSPITAL_COMMUNITY): Payer: Self-pay

## 2018-09-10 NOTE — Telephone Encounter (Signed)
Encounter complete. 

## 2018-09-12 ENCOUNTER — Ambulatory Visit (HOSPITAL_COMMUNITY)
Admission: RE | Admit: 2018-09-12 | Discharge: 2018-09-12 | Disposition: A | Discharge status: Home / Self Care | Payer: Medicare HMO | Source: Ambulatory Visit | Attending: Cardiology | Admitting: Cardiology

## 2018-09-12 DIAGNOSIS — I739 Peripheral vascular disease, unspecified: Secondary | ICD-10-CM | POA: Diagnosis not present

## 2018-09-12 DIAGNOSIS — I2584 Coronary atherosclerosis due to calcified coronary lesion: Secondary | ICD-10-CM | POA: Diagnosis not present

## 2018-09-12 DIAGNOSIS — R0600 Dyspnea, unspecified: Secondary | ICD-10-CM | POA: Diagnosis not present

## 2018-09-12 DIAGNOSIS — F17200 Nicotine dependence, unspecified, uncomplicated: Secondary | ICD-10-CM | POA: Diagnosis not present

## 2018-09-12 DIAGNOSIS — I2510 Atherosclerotic heart disease of native coronary artery without angina pectoris: Secondary | ICD-10-CM | POA: Diagnosis not present

## 2018-09-12 LAB — MYOCARDIAL PERFUSION IMAGING
CSEPPHR: 114 {beats}/min
LV dias vol: 63 mL (ref 46–106)
LV sys vol: 23 mL
Rest HR: 66 {beats}/min
SDS: 1
SRS: 2
SSS: 3
TID: 1.02

## 2018-09-12 MED ORDER — TECHNETIUM TC 99M TETROFOSMIN IV KIT
31.1000 | PACK | Freq: Once | INTRAVENOUS | Status: AC | PRN
  Administered 2018-09-12: 31.1 via INTRAVENOUS
  Filled 2018-09-12: qty 32

## 2018-09-12 MED ORDER — TECHNETIUM TC 99M TETROFOSMIN IV KIT
10.4000 | PACK | Freq: Once | INTRAVENOUS | Status: AC | PRN
  Administered 2018-09-12: 10.4 via INTRAVENOUS
  Filled 2018-09-12: qty 11

## 2018-09-12 MED ORDER — REGADENOSON 0.4 MG/5ML IV SOLN
0.4000 mg | Freq: Once | INTRAVENOUS | Status: AC
  Administered 2018-09-12: 0.4 mg via INTRAVENOUS

## 2018-09-17 ENCOUNTER — Ambulatory Visit (HOSPITAL_COMMUNITY)
Admission: RE | Admit: 2018-09-17 | Discharge: 2018-09-17 | Disposition: A | Discharge status: Home / Self Care | Payer: Medicare HMO | Source: Ambulatory Visit | Attending: Cardiovascular Disease | Admitting: Cardiovascular Disease

## 2018-09-17 DIAGNOSIS — F17200 Nicotine dependence, unspecified, uncomplicated: Secondary | ICD-10-CM

## 2018-09-17 DIAGNOSIS — I739 Peripheral vascular disease, unspecified: Secondary | ICD-10-CM

## 2018-09-23 ENCOUNTER — Telehealth: Payer: Self-pay

## 2018-09-23 NOTE — Telephone Encounter (Signed)
-----   Message from Fidel Levy, RN sent at 09/03/2018  4:27 PM EST ----- Regarding: RE: RECIEVED APPROVAL Yes to Rx. She will call with co-pay/affordability  ----- Message ----- From: Allean Found, CMA Sent: 09/03/2018   4:11 PM EST To: Fidel Levy, RN Subject: RECIEVED APPROVAL                              You already sent rx to pharmacy? And did she call with copay info?

## 2018-09-23 NOTE — Telephone Encounter (Signed)
Called left msg wanted to follow up w/pt regarding copay amount for repatha so that we can get them on it asap

## 2018-09-25 ENCOUNTER — Ambulatory Visit: Payer: Medicare HMO | Admitting: Cardiovascular Disease

## 2018-09-25 ENCOUNTER — Encounter: Payer: Self-pay | Admitting: Cardiovascular Disease

## 2018-09-25 VITALS — BP 128/70 | HR 72 | Ht 61.5 in | Wt 118.4 lb

## 2018-09-25 DIAGNOSIS — I739 Peripheral vascular disease, unspecified: Secondary | ICD-10-CM

## 2018-09-25 DIAGNOSIS — I10 Essential (primary) hypertension: Secondary | ICD-10-CM

## 2018-09-25 NOTE — Patient Instructions (Addendum)
Medication Instructions:  Your physician recommends that you continue on your current medications as directed. Please refer to the Current Medication list given to you today.  If you need a refill on your cardiac medications before your next appointment, please call your pharmacy.   Lab work: NONE If you have labs (blood work) drawn today and your tests are completely normal, you will receive your results only by: Marland Kitchen MyChart Message (if you have MyChart) OR . A paper copy in the mail If you have any lab test that is abnormal or we need to change your treatment, we will call you to review the results.  Testing/Procedures: NONE  Follow-Up: At [facility], you and your health needs are our priority.  As part of our continuing mission to provide you with exceptional heart care, we have created designated Provider Care Teams.  These Care Teams include your primary Cardiologist (physician) and Advanced Practice Providers (APPs -  Physician Assistants and Nurse Practitioners) who all work together to provide you with the care you need, when you need it. . You may schedule a follow up appointment AS NEEDED. You may see Dr. Gwenlyn Found or one of the following Advanced Practice Providers on your designated Care Team:   . Kerin Ransom, [place] . Almyra Deforest, PA-C . Fabian Sharp, PA-C . Jory Sims, DNP . Rosaria Ferries, PA-C . Roby Lofts, PA-C . Sande Rives, PA-C  Any Other Special Instructions Will Be Listed Below (If Applicable). DR. Gwenlyn Found DISCUSSED PV ANGIOGRAM INTERVENTION WITH YOU DURING YOUR OFFICE VISIT. PLEASE CALL OUR OFFICE WHEN YOU ARE READY TO SCHEDULE A PV ANGIOGRAM.

## 2018-09-25 NOTE — Assessment & Plan Note (Signed)
Ms. Winterrowd was referred to me by Dr. Debara Pickett for evaluation of lifestyle limiting left hip claudication.  She had recent Doppler studies performed 09/17/2018 revealing a right ABI of 1.1 and a left ABI of 0.72 with what appears to be an occluded left iliac.  She has no left femoral pulse and a 2+ right femoral pulse.  I believe she would benefit from angiography and potential endovascular therapy.

## 2018-09-25 NOTE — H&P (View-Only) (Signed)
09/25/2018 Leah [name]   05/18/1946  [id number]  Primary Physician Leah Held, DO Primary Cardiologist: Leah Harp MD Leah [name]  HPI:  Leah [name] is a 73 y.o. thin appearing married Caucasian female, mother 33, grandmother of 5 grandchildren referred by Leah [name] for peripheral vascular valuation because of left lower extremity claudication.  Her risk factors include 50 pack years of tobacco abuse smoking 1 pack/day, treated hypertension and severe hyperlipidemia.  She does have a strong family history for heart disease with a father who had CABG at age 63 and brother who is had stents.  She is never had a heart attack or stroke.  She denies chest pain or shortness of breath.  She does have a daughter age 40 who has had what sounds like an aortobifemoral bypass graft.  She is complained of left hip claudication for several years and has been seen by an orthopedic surgeon.  She is been getting hip injections for this pain and was told that she had "bursitis".   Current Meds  Medication Sig  . atorvastatin (LIPITOR) 80 MG tablet TAKE 1 TABLET BY MOUTH EVERY DAY  . clopidogrel (PLAVIX) 75 MG tablet Take 1 tablet (75 mg total) by mouth daily.  Marland Kitchen dicyclomine (BENTYL) 10 MG capsule TAKE 1 CAPSULE BY MOUTH AS NEEDED FOR UP TO 1 DOSE FOR SPASMS.  Marland Kitchen esomeprazole (NEXIUM) 40 MG capsule Take 1 capsule (40 mg total) by mouth 2 (two) times daily before a meal.  . estradiol (CLIMARA - DOSED IN MG/24 HR) 0.1 mg/24hr patch Place 0.1 mg onto the skin every Monday.  . Evolocumab (REPATHA SURECLICK) 749 MG/ML SOAJ Inject 1 Dose into the skin every 14 (fourteen) days.  Marland Kitchen ezetimibe (ZETIA) 10 MG tablet TAKE 1 TABLET BY MOUTH EVERY DAY  . ibuprofen (ADVIL,MOTRIN) 200 MG tablet Take 400 mg by mouth every 6 (six) hours as needed (pain).  Marland Kitchen linaclotide (LINZESS) 145 MCG CAPS capsule Take 1 capsule (145 mcg total) by mouth daily.  Marland Kitchen lisinopril (PRINIVIL,ZESTRIL) 20 MG  tablet Take 1 tablet (20 mg total) by mouth daily.  Marland Kitchen LORazepam (ATIVAN) 0.5 MG tablet Take 0.5 mg by mouth at bedtime.   . sertraline (ZOLOFT) 50 MG tablet Take 50 mg by mouth at bedtime.  . sodium chloride (OCEAN) 0.65 % SOLN nasal spray Place 1 spray into both nostrils daily as needed for congestion.     Allergies  Allergen Reactions  . Amoxicillin-Pot Clavulanate Shortness Of Breath, Itching and Swelling  . Avelox [Moxifloxacin Hcl In Nacl] Swelling    Swelling, itching and shortness of breath  . Ciprofloxacin Shortness Of Breath, Itching and Swelling  . Keflex [Cephalexin] Shortness Of Breath and Swelling  . Moxifloxacin Shortness Of Breath, Itching and Swelling  . Penicillins Shortness Of Breath, Itching and Swelling  . Sulfa Antibiotics Shortness Of Breath, Itching and Swelling  . Prednisone Other (See Comments)    GI irritation; is able to tolerate injections  . Acyclovir And Related Other (See Comments)    Pt does not recall this reaction  . Clindamycin/Lincomycin     Itching, swelling  . Zyvox [Linezolid] Diarrhea    Social History   Socioeconomic History  . Marital status: Married    Spouse name: Not on file  . Number of children: Not on file  . Years of education: Not on file  . Highest education level: Not on file  Occupational History  . Not on file  Social Needs  . Financial resource strain: Not on file  . Food insecurity:    Worry: Not on file    Inability: Not on file  . Transportation needs:    Medical: Not on file    Non-medical: Not on file  Tobacco Use  . Smoking status: Current Every Day Smoker    Packs/day: 1.00    Years: 47.00    Pack years: 47.00    Types: Cigarettes  . Smokeless tobacco: Never Used  . Tobacco comment: counseled about smoking cessation  Substance and Sexual Activity  . Alcohol use: No    Alcohol/week: 0.0 standard drinks  . Drug use: No  . Sexual activity: Not Currently    Birth control/protection: Post-menopausal   Lifestyle  . Physical activity:    Days per week: Not on file    Minutes per session: Not on file  . Stress: Not on file  Relationships  . Social connections:    Talks on phone: Not on file    Gets together: Not on file    Attends religious service: Not on file    Active member of club or organization: Not on file    Attends meetings of clubs or organizations: Not on file    Relationship status: Not on file  . Intimate partner violence:    Fear of current or ex partner: Not on file    Emotionally abused: Not on file    Physically abused: Not on file    Forced sexual activity: Not on file  Other Topics Concern  . Not on file  Social History Narrative  . Not on file     Review of Systems: General: negative for chills, fever, night sweats or weight changes.  Cardiovascular: negative for chest pain, dyspnea on exertion, edema, orthopnea, palpitations, paroxysmal nocturnal dyspnea or shortness of breath Dermatological: negative for rash Respiratory: negative for cough or wheezing Urologic: negative for hematuria Abdominal: negative for nausea, vomiting, diarrhea, bright red blood per rectum, melena, or hematemesis Neurologic: negative for visual changes, syncope, or dizziness All other systems reviewed and are otherwise negative except as noted above.    Blood pressure 128/70, pulse 72, height 5' 1.5" (1.562 m), weight 118 lb 6.4 oz (53.7 kg).  General appearance: alert and no distress Neck: no adenopathy, no carotid bruit, no JVD, supple, symmetrical, trachea midline and thyroid not enlarged, symmetric, no tenderness/mass/nodules Lungs: clear to auscultation bilaterally Heart: normal apical impulse Extremities: extremities normal, atraumatic, no cyanosis or edema Pulses: Absent left common femoral pulse Skin: Skin color, texture, turgor normal. No rashes or lesions Neurologic: Alert and oriented X 3, normal strength and tone. Normal symmetric reflexes. Normal coordination and  gait  EKG sinus rhythm at 72 without ST or T wave changes. I  Personally reviewed this EKG.  ASSESSMENT AND PLAN:   PAD (peripheral artery disease) ([place]) Leah [name] was referred to me by Leah [name] for evaluation of lifestyle limiting left hip claudication.  She had recent Doppler studies performed 09/17/2018 revealing a right ABI of 1.1 and a left ABI of 0.72 with what appears to be an occluded left iliac.  She has no left femoral pulse and a 2+ right femoral pulse.  I believe she would benefit from angiography and potential endovascular therapy.      Leah Harp MD FACP,FACC,FAHA, [facility] 09/25/2018 2:48 PM

## 2018-09-25 NOTE — Progress Notes (Signed)
09/25/2018 Leah [name]   1945/09/02  [id number]  Primary Physician Leah Held, DO Primary Cardiologist: Leah Harp MD Lupe Carney, [place]  HPI:  Leah [name] is a 73 y.o. thin appearing married Caucasian female, mother 63, grandmother of 5 grandchildren referred by Dr. Debara [name] for peripheral vascular valuation because of left lower extremity claudication.  Her risk factors include 50 pack years of tobacco abuse smoking 1 pack/day, treated hypertension and severe hyperlipidemia.  She does have a strong family history for heart disease with a father who had CABG at age 19 and brother who is had stents.  She is never had a heart attack or stroke.  She denies chest pain or shortness of breath.  She does have a daughter age 34 who has had what sounds like an aortobifemoral bypass graft.  She is complained of left hip claudication for several years and has been seen by an orthopedic surgeon.  She is been getting hip injections for this pain and was told that she had "bursitis".   Current Meds  Medication Sig  . atorvastatin (LIPITOR) 80 MG tablet TAKE 1 TABLET BY MOUTH EVERY DAY  . clopidogrel (PLAVIX) 75 MG tablet Take 1 tablet (75 mg total) by mouth daily.  Marland Kitchen dicyclomine (BENTYL) 10 MG capsule TAKE 1 CAPSULE BY MOUTH AS NEEDED FOR UP TO 1 DOSE FOR SPASMS.  Marland Kitchen esomeprazole (NEXIUM) 40 MG capsule Take 1 capsule (40 mg total) by mouth 2 (two) times daily before a meal.  . estradiol (CLIMARA - DOSED IN MG/24 HR) 0.1 mg/24hr patch Place 0.1 mg onto the skin every Monday.  . Evolocumab (REPATHA SURECLICK) 209 MG/ML SOAJ Inject 1 Dose into the skin every 14 (fourteen) days.  Marland Kitchen ezetimibe (ZETIA) 10 MG tablet TAKE 1 TABLET BY MOUTH EVERY DAY  . ibuprofen (ADVIL,MOTRIN) 200 MG tablet Take 400 mg by mouth every 6 (six) hours as needed (pain).  Marland Kitchen linaclotide (LINZESS) 145 MCG CAPS capsule Take 1 capsule (145 mcg total) by mouth daily.  Marland Kitchen lisinopril (PRINIVIL,ZESTRIL) 20 MG  tablet Take 1 tablet (20 mg total) by mouth daily.  Marland Kitchen LORazepam (ATIVAN) 0.5 MG tablet Take 0.5 mg by mouth at bedtime.   . sertraline (ZOLOFT) 50 MG tablet Take 50 mg by mouth at bedtime.  . sodium chloride (OCEAN) 0.65 % SOLN nasal spray Place 1 spray into both nostrils daily as needed for congestion.     Allergies  Allergen Reactions  . Amoxicillin-Pot Clavulanate Shortness Of Breath, Itching and Swelling  . Avelox [Moxifloxacin Hcl In Nacl] Swelling    Swelling, itching and shortness of breath  . Ciprofloxacin Shortness Of Breath, Itching and Swelling  . Keflex [Cephalexin] Shortness Of Breath and Swelling  . Moxifloxacin Shortness Of Breath, Itching and Swelling  . Penicillins Shortness Of Breath, Itching and Swelling  . Sulfa Antibiotics Shortness Of Breath, Itching and Swelling  . Prednisone Other (See Comments)    GI irritation; is able to tolerate injections  . Acyclovir And Related Other (See Comments)    Pt does not recall this reaction  . Clindamycin/Lincomycin     Itching, swelling  . Zyvox [Linezolid] Diarrhea    Social History   Socioeconomic History  . Marital status: Married    Spouse name: Not on file  . Number of children: Not on file  . Years of education: Not on file  . Highest education level: Not on file  Occupational History  . Not on file  Social Needs  . Financial resource strain: Not on file  . Food insecurity:    Worry: Not on file    Inability: Not on file  . Transportation needs:    Medical: Not on file    Non-medical: Not on file  Tobacco Use  . Smoking status: Current Every Day Smoker    Packs/day: 1.00    Years: 47.00    Pack years: 47.00    Types: Cigarettes  . Smokeless tobacco: Never Used  . Tobacco comment: counseled about smoking cessation  Substance and Sexual Activity  . Alcohol use: No    Alcohol/week: 0.0 standard drinks  . Drug use: No  . Sexual activity: Not Currently    Birth control/protection: Post-menopausal    Lifestyle  . Physical activity:    Days per week: Not on file    Minutes per session: Not on file  . Stress: Not on file  Relationships  . Social connections:    Talks on phone: Not on file    Gets together: Not on file    Attends religious service: Not on file    Active member of club or organization: Not on file    Attends meetings of clubs or organizations: Not on file    Relationship status: Not on file  . Intimate partner violence:    Fear of current or ex partner: Not on file    Emotionally abused: Not on file    Physically abused: Not on file    Forced sexual activity: Not on file  Other Topics Concern  . Not on file  Social History Narrative  . Not on file     Review of Systems: General: negative for chills, fever, night sweats or weight changes.  Cardiovascular: negative for chest pain, dyspnea on exertion, edema, orthopnea, palpitations, paroxysmal nocturnal dyspnea or shortness of breath Dermatological: negative for rash Respiratory: negative for cough or wheezing Urologic: negative for hematuria Abdominal: negative for nausea, vomiting, diarrhea, bright red blood per rectum, melena, or hematemesis Neurologic: negative for visual changes, syncope, or dizziness All other systems reviewed and are otherwise negative except as noted above.    Blood pressure 128/70, pulse 72, height 5' 1.5" (1.562 m), weight 118 lb 6.4 oz (53.7 kg).  General appearance: alert and no distress Neck: no adenopathy, no carotid bruit, no JVD, supple, symmetrical, trachea midline and thyroid not enlarged, symmetric, no tenderness/mass/nodules Lungs: clear to auscultation bilaterally Heart: normal apical impulse Extremities: extremities normal, atraumatic, no cyanosis or edema Pulses: Absent left common femoral pulse Skin: Skin color, texture, turgor normal. No rashes or lesions Neurologic: Alert and oriented X 3, normal strength and tone. Normal symmetric reflexes. Normal coordination  and gait  EKG sinus rhythm at 72 without ST or T wave changes. I  Personally reviewed this EKG.  ASSESSMENT AND PLAN:   PAD (peripheral artery disease) ([place]) Ms. Strahm was referred to me by Dr. Debara [name] for evaluation of lifestyle limiting left hip claudication.  She had recent Doppler studies performed 09/17/2018 revealing a right ABI of 1.1 and a left ABI of 0.72 with what appears to be an occluded left iliac.  She has no left femoral pulse and a 2+ right femoral pulse.  I believe she would benefit from angiography and potential endovascular therapy.      Leah Harp MD FACP,FACC,FAHA, [facility] 09/25/2018 2:48 PM

## 2018-09-30 ENCOUNTER — Telehealth: Payer: Self-pay | Admitting: Cardiovascular Disease

## 2018-09-30 NOTE — Telephone Encounter (Signed)
New Message   Pt is calling to schedule a procedure for a stent. Please call back

## 2018-09-30 NOTE — Telephone Encounter (Signed)
FYI: Pt ready to schedule PV procedure.

## 2018-10-01 ENCOUNTER — Telehealth: Payer: Self-pay

## 2018-10-01 ENCOUNTER — Other Ambulatory Visit: Payer: Self-pay

## 2018-10-01 DIAGNOSIS — Z01812 Encounter for preprocedural laboratory examination: Secondary | ICD-10-CM

## 2018-10-01 DIAGNOSIS — I739 Peripheral vascular disease, unspecified: Secondary | ICD-10-CM

## 2018-10-01 NOTE — Telephone Encounter (Signed)
Chi, schedule Ms. Bann for abdominal aortogram and potential iliac intervention sometime the next 2 or 3 weeks.  I could even do it next week if it is convenient with her.  Have Randall Hiss and Adrian Blackwater their place

## 2018-10-01 NOTE — Telephone Encounter (Signed)
Contacted pt to review instructions paperwork for upcoming PV procedure scheduled for 3/16. Pt aware to present for lab work within 1-2 weeks before procedure and that letter with lab slip and pre-procedure and post-procedure instructions will be mailed to her. Pt also aware that she will be contacted to schedule post-procedure dopplers and post-procedure OV. Pt verbalized understanding. Lab slip and instruction letter mailed to pt

## 2018-10-01 NOTE — Telephone Encounter (Signed)
Follow up    Patient calling in reference to scheduling for a stent.

## 2018-10-01 NOTE — Telephone Encounter (Signed)
Follow up    Patient is calling about scheduling a date for her procedure.

## 2018-10-01 NOTE — Telephone Encounter (Signed)
Will forward to [place] with Dr Gwenlyn Found to arrange

## 2018-10-03 DIAGNOSIS — F419 Anxiety disorder, unspecified: Secondary | ICD-10-CM | POA: Diagnosis not present

## 2018-10-04 DIAGNOSIS — I10 Essential (primary) hypertension: Secondary | ICD-10-CM | POA: Diagnosis not present

## 2018-10-04 DIAGNOSIS — Z01812 Encounter for preprocedural laboratory examination: Secondary | ICD-10-CM | POA: Diagnosis not present

## 2018-10-04 DIAGNOSIS — I739 Peripheral vascular disease, unspecified: Secondary | ICD-10-CM | POA: Diagnosis not present

## 2018-10-05 LAB — BASIC METABOLIC PANEL
BUN/Creatinine Ratio: 11 — ABNORMAL LOW (ref 12–28)
BUN: 10 mg/dL (ref 8–27)
CO2: 22 mmol/L (ref 20–29)
Calcium: 9.3 mg/dL (ref 8.7–10.3)
Chloride: 103 mmol/L (ref 96–106)
Creatinine, Ser: 0.92 mg/dL (ref 0.57–1.00)
GFR calc non Af Amer: 63 mL/min/{1.73_m2} (ref >59)
GFR, EST AFRICAN AMERICAN: 73 mL/min/{1.73_m2} (ref >59)
Glucose: 105 mg/dL — ABNORMAL HIGH (ref 65–99)
POTASSIUM: 5.1 mmol/L (ref 3.5–5.2)
Sodium: 141 mmol/L (ref 134–144)

## 2018-10-05 LAB — CBC
Hematocrit: 38.5 % (ref 34.0–46.6)
Hemoglobin: 12.8 g/dL (ref 11.1–15.9)
MCH: 29.2 pg (ref 26.6–33.0)
MCHC: 33.2 g/dL (ref 31.5–35.7)
MCV: 88 fL (ref 79–97)
Platelets: 228 10*3/uL (ref 150–450)
RBC: 4.38 x10E6/uL (ref 3.77–5.28)
RDW: 13.1 % (ref 11.7–15.4)
WBC: 5.7 10*3/uL (ref 3.4–10.8)

## 2018-10-08 ENCOUNTER — Telehealth: Payer: Self-pay | Admitting: Cardiovascular Disease

## 2018-10-08 NOTE — Telephone Encounter (Signed)
[place]         Patient is calling about medication (Plavix) wants to know if it's ok to take before surgery on 10/14/18? Pls call and advise.

## 2018-10-08 NOTE — Telephone Encounter (Signed)
Returned call to patient she stated she is scheduled for pv angiogram next week and on her instructions it says to take Plavix morning of procedure.Advised to follow instructions as given and take Plavix morning of procedure with sips of water.

## 2018-10-10 ENCOUNTER — Telehealth: Payer: Self-pay | Admitting: *Deleted

## 2018-10-10 NOTE — Telephone Encounter (Addendum)
Pt contacted pre-PV procedure  scheduled at [facility] for: Monday October 14, 2018 7:30 AM Verified arrival time and place: [facility] Entrance A at: 5:30 AM  No solid food after midnight prior to cath, clear liquids until 5 AM day of procedure. Contrast allergy: no   AM meds can be  taken pre-cath with sip of water including: ASA 81 mg Clopidogrel 75 mg  Confirm patient has responsible person to drive home post procedure and observe 24 hours after arriving home.  I reviewed instructions with patient, she verbalized understanding, thanked me for call.

## 2018-10-14 ENCOUNTER — Encounter (HOSPITAL_COMMUNITY)
Admission: RE | Disposition: A | Discharge status: Home / Self Care | Payer: Self-pay | Source: Home / Self Care | Attending: Cardiovascular Disease

## 2018-10-14 ENCOUNTER — Other Ambulatory Visit: Payer: Self-pay

## 2018-10-14 ENCOUNTER — Ambulatory Visit (HOSPITAL_COMMUNITY)
Admission: RE | Admit: 2018-10-14 | Discharge: 2018-10-15 | Disposition: A | Discharge status: Home / Self Care | Payer: Medicare HMO | Attending: Cardiovascular Disease | Admitting: Cardiovascular Disease

## 2018-10-14 ENCOUNTER — Encounter (HOSPITAL_COMMUNITY): Payer: Self-pay | Admitting: General Practice

## 2018-10-14 DIAGNOSIS — Z880 Allergy status to penicillin: Secondary | ICD-10-CM | POA: Diagnosis not present

## 2018-10-14 DIAGNOSIS — I10 Essential (primary) hypertension: Secondary | ICD-10-CM | POA: Diagnosis not present

## 2018-10-14 DIAGNOSIS — Z7902 Long term (current) use of antithrombotics/antiplatelets: Secondary | ICD-10-CM | POA: Insufficient documentation

## 2018-10-14 DIAGNOSIS — Z882 Allergy status to sulfonamides status: Secondary | ICD-10-CM | POA: Insufficient documentation

## 2018-10-14 DIAGNOSIS — E785 Hyperlipidemia, unspecified: Secondary | ICD-10-CM | POA: Insufficient documentation

## 2018-10-14 DIAGNOSIS — Z881 Allergy status to other antibiotic agents status: Secondary | ICD-10-CM | POA: Insufficient documentation

## 2018-10-14 DIAGNOSIS — F17210 Nicotine dependence, cigarettes, uncomplicated: Secondary | ICD-10-CM | POA: Diagnosis not present

## 2018-10-14 DIAGNOSIS — Z8249 Family history of ischemic heart disease and other diseases of the circulatory system: Secondary | ICD-10-CM | POA: Diagnosis not present

## 2018-10-14 DIAGNOSIS — I714 Abdominal aortic aneurysm, without rupture: Secondary | ICD-10-CM | POA: Insufficient documentation

## 2018-10-14 DIAGNOSIS — Z79899 Other long term (current) drug therapy: Secondary | ICD-10-CM | POA: Diagnosis not present

## 2018-10-14 DIAGNOSIS — Z888 Allergy status to other drugs, medicaments and biological substances status: Secondary | ICD-10-CM | POA: Insufficient documentation

## 2018-10-14 DIAGNOSIS — I70212 Atherosclerosis of native arteries of extremities with intermittent claudication, left leg: Secondary | ICD-10-CM | POA: Insufficient documentation

## 2018-10-14 DIAGNOSIS — I739 Peripheral vascular disease, unspecified: Secondary | ICD-10-CM

## 2018-10-14 HISTORY — PX: PERIPHERAL VASCULAR INTERVENTION: CATH118257

## 2018-10-14 HISTORY — DX: Peripheral vascular disease, unspecified: I73.9

## 2018-10-14 HISTORY — PX: ABDOMINAL AORTOGRAM: CATH118222

## 2018-10-14 HISTORY — PX: LOWER EXTREMITY ANGIOGRAPHY: CATH118251

## 2018-10-14 LAB — POCT ACTIVATED CLOTTING TIME
ACTIVATED CLOTTING TIME: 153 s
Activated Clotting Time: 230 seconds
Activated Clotting Time: 285 seconds
Activated Clotting Time: 241 seconds

## 2018-10-14 SURGERY — PERIPHERAL VASCULAR INTERVENTION
Anesthesia: LOCAL

## 2018-10-14 MED ORDER — LIDOCAINE HCL (PF) 1 % IJ SOLN
INTRAMUSCULAR | Status: AC
  Filled 2018-10-14: qty 30

## 2018-10-14 MED ORDER — SERTRALINE HCL 50 MG PO TABS
50.0000 mg | ORAL_TABLET | Freq: Every day | ORAL | Status: DC
  Administered 2018-10-14: 50 mg via ORAL
  Filled 2018-10-14: qty 1

## 2018-10-14 MED ORDER — CLOPIDOGREL BISULFATE 75 MG PO TABS: 75.0000 mg | ORAL_TABLET | Freq: Every day | ORAL | Status: DC

## 2018-10-14 MED ORDER — FENTANYL CITRATE (PF) 100 MCG/2ML IJ SOLN
INTRAMUSCULAR | Status: AC
  Filled 2018-10-14: qty 2

## 2018-10-14 MED ORDER — MIDAZOLAM HCL 2 MG/2ML IJ SOLN
INTRAMUSCULAR | Status: DC | PRN
  Administered 2018-10-14 (×2): 1 mg via INTRAVENOUS

## 2018-10-14 MED ORDER — HYDRALAZINE HCL 20 MG/ML IJ SOLN: 5.0000 mg | INTRAMUSCULAR | Status: DC | PRN

## 2018-10-14 MED ORDER — MIDAZOLAM HCL 2 MG/2ML IJ SOLN
INTRAMUSCULAR | Status: AC
  Filled 2018-10-14: qty 2

## 2018-10-14 MED ORDER — LISINOPRIL 10 MG PO TABS
20.0000 mg | ORAL_TABLET | Freq: Every day | ORAL | Status: DC
  Administered 2018-10-14 – 2018-10-15 (×2): 20 mg via ORAL
  Filled 2018-10-14 (×2): qty 2

## 2018-10-14 MED ORDER — LORAZEPAM 0.5 MG PO TABS
0.5000 mg | ORAL_TABLET | Freq: Every day | ORAL | Status: DC
  Administered 2018-10-14: 0.5 mg via ORAL
  Filled 2018-10-14: qty 1

## 2018-10-14 MED ORDER — ACETAMINOPHEN 325 MG PO TABS: 650.0000 mg | ORAL_TABLET | ORAL | Status: DC | PRN

## 2018-10-14 MED ORDER — SODIUM CHLORIDE 0.9% FLUSH: 3.0000 mL | Freq: Two times a day (BID) | INTRAVENOUS | Status: DC

## 2018-10-14 MED ORDER — LABETALOL HCL 5 MG/ML IV SOLN: 10.0000 mg | INTRAVENOUS | Status: DC | PRN

## 2018-10-14 MED ORDER — PANTOPRAZOLE SODIUM 40 MG PO TBEC
40.0000 mg | DELAYED_RELEASE_TABLET | Freq: Every day | ORAL | Status: DC
  Administered 2018-10-14 – 2018-10-15 (×2): 40 mg via ORAL
  Filled 2018-10-14 (×2): qty 1

## 2018-10-14 MED ORDER — HEPARIN (PORCINE) IN NACL 1000-0.9 UT/500ML-% IV SOLN
INTRAVENOUS | Status: AC
  Filled 2018-10-14: qty 1000

## 2018-10-14 MED ORDER — MORPHINE SULFATE (PF) 2 MG/ML IV SOLN
2.0000 mg | INTRAVENOUS | Status: DC | PRN
  Administered 2018-10-14: 11:00:00 2 mg via INTRAVENOUS
  Filled 2018-10-14: qty 1

## 2018-10-14 MED ORDER — SODIUM CHLORIDE 0.9 % WEIGHT BASED INFUSION: 1.0000 mL/kg/h | INTRAVENOUS | Status: DC

## 2018-10-14 MED ORDER — CLOPIDOGREL BISULFATE 75 MG PO TABS
75.0000 mg | ORAL_TABLET | Freq: Every day | ORAL | Status: DC
  Administered 2018-10-15: 10:00:00 75 mg via ORAL
  Filled 2018-10-14: qty 1

## 2018-10-14 MED ORDER — ATORVASTATIN CALCIUM 80 MG PO TABS
80.0000 mg | ORAL_TABLET | Freq: Every day | ORAL | Status: DC
  Administered 2018-10-14 – 2018-10-15 (×2): 80 mg via ORAL
  Filled 2018-10-14 (×2): qty 1

## 2018-10-14 MED ORDER — HEPARIN SODIUM (PORCINE) 1000 UNIT/ML IJ SOLN
INTRAMUSCULAR | Status: DC | PRN
  Administered 2018-10-14: 3000 [IU] via INTRAVENOUS
  Administered 2018-10-14: 6000 [IU] via INTRAVENOUS

## 2018-10-14 MED ORDER — HEPARIN (PORCINE) IN NACL 1000-0.9 UT/500ML-% IV SOLN
INTRAVENOUS | Status: DC | PRN
  Administered 2018-10-14 (×2): 500 mL

## 2018-10-14 MED ORDER — ASPIRIN 81 MG PO CHEW
81.0000 mg | CHEWABLE_TABLET | ORAL | Status: AC
  Administered 2018-10-14: 81 mg via ORAL
  Filled 2018-10-14: qty 1

## 2018-10-14 MED ORDER — SODIUM CHLORIDE 0.9 % IV SOLN: INTRAVENOUS | Status: AC

## 2018-10-14 MED ORDER — ASPIRIN EC 81 MG PO TBEC
81.0000 mg | DELAYED_RELEASE_TABLET | Freq: Every day | ORAL | Status: DC
  Administered 2018-10-15: 81 mg via ORAL
  Filled 2018-10-14: qty 1

## 2018-10-14 MED ORDER — IODIXANOL 320 MG/ML IV SOLN
INTRAVENOUS | Status: DC | PRN
  Administered 2018-10-14: 190 mL via INTRA_ARTERIAL

## 2018-10-14 MED ORDER — FENTANYL CITRATE (PF) 100 MCG/2ML IJ SOLN
INTRAMUSCULAR | Status: DC | PRN
  Administered 2018-10-14 (×2): 25 ug via INTRAVENOUS

## 2018-10-14 MED ORDER — LIDOCAINE HCL (PF) 1 % IJ SOLN
INTRAMUSCULAR | Status: DC | PRN
  Administered 2018-10-14: 28 mL via INTRADERMAL
  Administered 2018-10-14: 15 mL via INTRADERMAL

## 2018-10-14 MED ORDER — ANGIOPLASTY BOOK
Freq: Once | Status: AC
  Administered 2018-10-14: 21:00:00

## 2018-10-14 MED ORDER — ONDANSETRON HCL 4 MG/2ML IJ SOLN
4.0000 mg | Freq: Four times a day (QID) | INTRAMUSCULAR | Status: DC | PRN
  Administered 2018-10-14: 4 mg via INTRAVENOUS
  Filled 2018-10-14: qty 2

## 2018-10-14 MED ORDER — SODIUM CHLORIDE 0.9% FLUSH: 3.0000 mL | INTRAVENOUS | Status: DC | PRN

## 2018-10-14 MED ORDER — SODIUM CHLORIDE 0.9 % WEIGHT BASED INFUSION
3.0000 mL/kg/h | INTRAVENOUS | Status: DC
  Administered 2018-10-14: 3 mL/kg/h via INTRAVENOUS

## 2018-10-14 MED ORDER — ATROPINE SULFATE 1 MG/10ML IJ SOSY
PREFILLED_SYRINGE | INTRAMUSCULAR | Status: AC
  Filled 2018-10-14: qty 10

## 2018-10-14 MED ORDER — SODIUM CHLORIDE 0.9% FLUSH
3.0000 mL | Freq: Two times a day (BID) | INTRAVENOUS | Status: DC
  Administered 2018-10-14 (×2): 3 mL via INTRAVENOUS

## 2018-10-14 MED ORDER — SODIUM CHLORIDE 0.9 % IV SOLN: 250.0000 mL | INTRAVENOUS | Status: DC | PRN

## 2018-10-14 SURGICAL SUPPLY — 31 items
BALLN MUSTANG 5.0X40 135 (BALLOONS) ×4
BALLN MUSTANG 8.0X40 75 (BALLOONS) ×4
BALLOON MUSTANG 5.0X40 135 (BALLOONS) ×3 IMPLANT
BALLOON MUSTANG 8.0X40 75 (BALLOONS) ×3 IMPLANT
CATH ANGIO 5F PIGTAIL 65CM (CATHETERS) ×4 IMPLANT
CATH CROSS OVER TEMPO 5F (CATHETERS) ×4 IMPLANT
CATH CXI 2.3F 65 ST (CATHETERS) ×4 IMPLANT
CATH NAVICROSS ANG 65CM (CATHETERS) ×3 IMPLANT
CATH NAVICROSS ST 65CM (CATHETERS) ×3 IMPLANT
CATHETER NAVICROSS ANG 65CM (CATHETERS) ×4
CATHETER NAVICROSS ST 65CM (CATHETERS) ×4
DEVICE TORQUE .014-.018 (MISCELLANEOUS) ×3 IMPLANT
GLIDEWIRE ANGLED SS 035X260CM (WIRE) ×4 IMPLANT
GUIDEWIRE REGALIA .014X300CM (WIRE) ×4 IMPLANT
KIT ENCORE 26 ADVANTAGE (KITS) ×4 IMPLANT
KIT PV (KITS) ×4 IMPLANT
SHEATH BRITE TIP 7FR 35CM (SHEATH) ×4 IMPLANT
SHEATH PINNACLE 5F 10CM (SHEATH) ×4 IMPLANT
SHEATH PINNACLE 7F 10CM (SHEATH) ×4 IMPLANT
SHEATH PROBE COVER 6X72 (BAG) ×4 IMPLANT
STENT VIABAHN 7X29X80 VBX (Permanent Stent) ×4 IMPLANT
STENT VIABAHN VBX 7X59X80 (Permanent Stent) ×4 IMPLANT
STOPCOCK MORSE 400PSI 3WAY (MISCELLANEOUS) ×4 IMPLANT
SYR MEDRAD MARK 7 150ML (SYRINGE) ×4 IMPLANT
TAPE RADIOPAQUE TURBO (MISCELLANEOUS) ×4 IMPLANT
TORQUE DEVICE .014-.018 (MISCELLANEOUS) ×4
TRANSDUCER W/STOPCOCK (MISCELLANEOUS) ×8 IMPLANT
TRAY PV CATH (CUSTOM PROCEDURE TRAY) ×4 IMPLANT
TUBING CIL FLEX 10 FLL-RA (TUBING) ×4 IMPLANT
WIRE HI TORQ VERSACORE J 260CM (WIRE) ×4 IMPLANT
WIRE HITORQ VERSACORE ST 145CM (WIRE) ×4 IMPLANT

## 2018-10-14 NOTE — Interval H&P Note (Signed)
History and Physical Interval Note:  10/14/2018 7:36 AM  Leah [name]  has presented today for surgery, with the diagnosis of Peripheral Arterial Disease.  The various methods of treatment have been discussed with the patient and family. After consideration of risks, benefits and other options for treatment, the patient has consented to  Procedure(s): ABDOMINAL AORTOGRAM W/LOWER EXTREMITY (N/A) as a surgical intervention.  The patient's history has been reviewed, patient examined, no change in status, stable for surgery.  I have reviewed the patient's chart and labs.  Questions were answered to the patient's satisfaction.     Quay Burow

## 2018-10-14 NOTE — Progress Notes (Signed)
Site area: left groin  Site Prior to Removal:  Level 0  Pressure Applied For 42 MINUTES    Minutes Beginning at 1420  Manual:   Yes.    Patient Status During Pull:  wnl  Post Pull Groin Site:  Level 0  Post Pull Instructions Given:  Yes.    Post Pull Pulses Present:  Yes.    Dressing Applied:  Yes.    Comments: Extended pressure hold done to obtain hemostasis at site. Tolerated procedure well.

## 2018-10-14 NOTE — Progress Notes (Signed)
Site area: left groin  Site Prior to Removal:  Level 0  Pressure Applied For 20 MINUTES    Minutes Beginning at 1350  Manual:   Yes.    Patient Status During Pull:  WNL  Post Pull Groin Site:  Level 0  Post Pull Instructions Given:  Yes.    Post Pull Pulses Present:  Yes.    Dressing Applied:  Yes.    Comments: Presented  nausea," feeling sick " during sheath pull procedure ,Sb/p noted dropped fr 90's to 60's, iv bolus 250 cc given and zofran 4 mg iv given with relief . Vital signs back to baseline, sheath pull completed . dsg on site applied .

## 2018-10-15 DIAGNOSIS — I70212 Atherosclerosis of native arteries of extremities with intermittent claudication, left leg: Secondary | ICD-10-CM | POA: Diagnosis not present

## 2018-10-15 DIAGNOSIS — Z7902 Long term (current) use of antithrombotics/antiplatelets: Secondary | ICD-10-CM | POA: Diagnosis not present

## 2018-10-15 DIAGNOSIS — Z79899 Other long term (current) drug therapy: Secondary | ICD-10-CM | POA: Diagnosis not present

## 2018-10-15 DIAGNOSIS — Z880 Allergy status to penicillin: Secondary | ICD-10-CM | POA: Diagnosis not present

## 2018-10-15 DIAGNOSIS — I714 Abdominal aortic aneurysm, without rupture: Secondary | ICD-10-CM | POA: Diagnosis not present

## 2018-10-15 DIAGNOSIS — F17210 Nicotine dependence, cigarettes, uncomplicated: Secondary | ICD-10-CM | POA: Diagnosis not present

## 2018-10-15 DIAGNOSIS — I739 Peripheral vascular disease, unspecified: Secondary | ICD-10-CM

## 2018-10-15 DIAGNOSIS — E785 Hyperlipidemia, unspecified: Secondary | ICD-10-CM | POA: Diagnosis not present

## 2018-10-15 DIAGNOSIS — I10 Essential (primary) hypertension: Secondary | ICD-10-CM | POA: Diagnosis not present

## 2018-10-15 DIAGNOSIS — Z8249 Family history of ischemic heart disease and other diseases of the circulatory system: Secondary | ICD-10-CM | POA: Diagnosis not present

## 2018-10-15 LAB — CBC
HCT: 35.2 % — ABNORMAL LOW (ref 36.0–46.0)
Hemoglobin: 10.9 g/dL — ABNORMAL LOW (ref 12.0–15.0)
MCH: 28.6 pg (ref 26.0–34.0)
MCHC: 31 g/dL (ref 30.0–36.0)
MCV: 92.4 fL (ref 80.0–100.0)
Platelets: 205 10*3/uL (ref 150–400)
RBC: 3.81 MIL/uL — ABNORMAL LOW (ref 3.87–5.11)
RDW: 14.1 % (ref 11.5–15.5)
WBC: 7 10*3/uL (ref 4.0–10.5)
nRBC: 0 % (ref 0.0–0.2)

## 2018-10-15 LAB — BASIC METABOLIC PANEL
Anion gap: 6 (ref 5–15)
BUN: 7 mg/dL — ABNORMAL LOW (ref 8–23)
CO2: 25 mmol/L (ref 22–32)
Calcium: 8.2 mg/dL — ABNORMAL LOW (ref 8.9–10.3)
Chloride: 106 mmol/L (ref 98–111)
Creatinine, Ser: 0.84 mg/dL (ref 0.44–1.00)
GFR calc Af Amer: >60 mL/min (ref >60)
GFR calc non Af Amer: >60 mL/min (ref >60)
Glucose, Bld: 118 mg/dL — ABNORMAL HIGH (ref 70–99)
Potassium: 4 mmol/L (ref 3.5–5.1)
SODIUM: 137 mmol/L (ref 135–145)

## 2018-10-15 MED ORDER — ASPIRIN 81 MG PO TBEC: 81.0000 mg | DELAYED_RELEASE_TABLET | Freq: Every day | ORAL | Status: DC

## 2018-10-15 NOTE — Discharge Summary (Addendum)
Discharge Summary    Patient ID: Leah [name],  MRN: [id number], DOB/AGE: [birth year] 73 y.o.  Admit date: 10/14/2018 Discharge date: 10/15/2018  Primary Care Provider: Roma Schanz R Primary Cardiologist: Dr. Gwenlyn Found   Discharge Diagnoses    Active Problems:   PAD (peripheral artery disease) [facility])   Claudication in peripheral vascular disease (HCC)   Allergies Allergies  Allergen Reactions  . Amoxicillin-Pot Clavulanate Shortness Of Breath, Itching and Swelling  . Avelox [Moxifloxacin Hcl In Nacl] Swelling    Swelling, itching and shortness of breath  . Ciprofloxacin Shortness Of Breath, Itching and Swelling  . Keflex [Cephalexin] Shortness Of Breath and Swelling  . Penicillins Shortness Of Breath, Itching and Swelling    Did it involve swelling of the face/tongue/throat, SOB, or low BP? Yes Did it involve sudden or severe rash/hives, skin peeling, or any reaction on the inside of your mouth or nose? No Did you need to seek medical attention at a hospital or doctor's office? Yes When did it last happen?Several years If all above answers are "NO", may proceed with cephalosporin use.   . Sulfa Antibiotics Shortness Of Breath, Itching and Swelling  . Prednisone Other (See Comments)    GI irritation; is able to tolerate injections  . Acyclovir And Related Other (See Comments)    Pt does not recall this reaction  . Clindamycin/Lincomycin Itching and Swelling  . Levaquin [Levofloxacin] Hives  . Zyvox [Linezolid] Diarrhea    Diagnostic Studies/Procedures    PV Intervention: 10/14/2018  Operators: Dr. Quay Burow  Procedures Performed:               1.  Abdominal aortogram/bilateral iliac angiogram               2.  PTA and covered stenting left common iliac artery CTO  Final Impression: Successful PTA and covered stenting of a calcified left common iliac artery CTO using covered VBX stents.  I suspect this was responsible for her chronic left hip  pain.  She will be hydrated overnight and discharged home in the morning on dual antiplatelet therapy.  She will obtain lower extremity arterial Doppler studies in our [facility] line office next week and I will see her back 2 to 3 weeks thereafter.  Quay Burow. MD, [facility], [place] _____________   History of Present Illness     Leah [name] is a 73 y.o. female with PMH of 50 pack years of tobacco abuse smoking 1 pack/day, treated hypertension and severe hyperlipidemia.  She also has a strong family history for heart disease with a father who had CABG at age 2 and brother who is had stents.  She has never had a heart attack or stroke. She denied chest pain or shortness of breath.  She does have a daughter age 58 who has had what sounded like an aortobifemoral bypass graft.  She was complaining of left hip claudication for several years and had been seen by an orthopedic surgeon.  She had been getting hip injections for this pain and was told that she had "bursitis". Given her symptoms she was set up for outpatient PV angiogram.   Hospital Course     Underwent PV angiogram with successful PTA and covered stenting of a calcified left common iliac artery CTO using covered VBX stents. Plan for DAPT with ASA/plavix. Morning labs were stable. Ambulated without complications.   General: Well developed, well nourished, female appearing in no acute distress. Head: Normocephalic, atraumatic.  Neck:  Supple without bruits, JVD. Lungs:  Resp regular and unlabored, CTA. Heart: RRR, S1, S2, no S3, S4, or murmur; no rub. Abdomen: Soft, non-tender, non-distended with normoactive bowel sounds. No hepatomegaly. No rebound/guarding. No obvious abdominal masses. Extremities: No clubbing, cyanosis, edema. Distal pedal pulses are 2+ bilaterally. Bilateral femoral cath site stable with mild bruising or hematoma Neuro: Alert and oriented X 3. Moves all extremities spontaneously. Psych: Normal affect.  Shakeena Kafer Adan  was seen by Dr. Burt Knack and determined stable for discharge home. Follow up in the office has been arranged. Medications are listed below.   _____________  Discharge Vitals Blood pressure 123/71, pulse 76, temperature 99 F (37.2 C), temperature source Oral, resp. rate 18, height 5\' 1"  (1.549 m), weight 54.6 kg, SpO2 92 %.  Filed Weights   10/14/18 0540 10/15/18 0335  Weight: 54 kg 54.6 kg    Labs & Radiologic Studies    CBC Recent Labs    10/15/18 0404  WBC 7.0  HGB 10.9*  HCT 35.2*  MCV 92.4  PLT 115   Basic Metabolic Panel Recent Labs    10/15/18 0404  NA 137  K 4.0  CL 106  CO2 25  GLUCOSE 118*  BUN 7*  CREATININE 0.84  CALCIUM 8.2*   Liver Function Tests No results for input(s): AST, ALT, ALKPHOS, BILITOT, PROT, ALBUMIN in the last 72 hours. No results for input(s): LIPASE, AMYLASE in the last 72 hours. Cardiac Enzymes No results for input(s): CKTOTAL, CKMB, CKMBINDEX, TROPONINI in the last 72 hours. BNP Invalid input(s): POCBNP D-Dimer No results for input(s): DDIMER in the last 72 hours. Hemoglobin A1C No results for input(s): HGBA1C in the last 72 hours. Fasting Lipid Panel No results for input(s): CHOL, HDL, LDLCALC, TRIG, CHOLHDL, LDLDIRECT in the last 72 hours. Thyroid Function Tests No results for input(s): TSH, T4TOTAL, T3FREE, THYROIDAB in the last 72 hours.  Invalid input(s): FREET3 _____________  Vas [place] Abi With/wo Tbi  Result Date: 09/19/2018 LOWER EXTREMITY DOPPLER STUDY Indications: Claudication, peripheral artery disease, and Patient complains of              left hip and leg pain after walking less than one block. This has              been ongoing for several years. High Risk Factors: Hypertension, hyperlipidemia, current smoker, coronary artery                    disease.  Comparison Study: In 03/2016, an arterial Doppler showed a right ABI of 1.06 and                   a left of 0.72. Performing Technologist: Wilkie Aye RVT  Examination  Guidelines: A complete evaluation includes at minimum, Doppler waveform signals and systolic blood pressure reading at the level of bilateral brachial, anterior tibial, and posterior tibial arteries, when vessel segments are accessible. Bilateral testing is considered an integral part of a complete examination. Photoelectric Plethysmograph (PPG) waveforms and toe systolic pressure readings are included as required and additional duplex testing as needed. Limited examinations for reoccurring indications may be performed as noted.  ABI Findings: +---------+------------------+-----+---------+--------+ Right    Rt Pressure (mmHg)IndexWaveform Comment  +---------+------------------+-----+---------+--------+ Brachial 145                                      +---------+------------------+-----+---------+--------+ ATA  165               1.11 biphasic          +---------+------------------+-----+---------+--------+ PTA      158               1.07 triphasic         +---------+------------------+-----+---------+--------+ PERO     157               1.06 triphasic         +---------+------------------+-----+---------+--------+ Great Toe181               1.22 Normal            +---------+------------------+-----+---------+--------+ +---------+------------------+-----+----------+-------+ Left     Lt Pressure (mmHg)IndexWaveform  Comment +---------+------------------+-----+----------+-------+ Brachial 148                                      +---------+------------------+-----+----------+-------+ ATA      104               0.70 monophasic        +---------+------------------+-----+----------+-------+ PTA      99                0.67 monophasic        +---------+------------------+-----+----------+-------+ PERO     107               0.72 monophasic        +---------+------------------+-----+----------+-------+ Great Toe78                0.53 Abnormal           +---------+------------------+-----+----------+-------+ +-------+-----------+-----------+------------+------------+ ABI/TBIToday's ABIToday's TBIPrevious ABIPrevious TBI +-------+-----------+-----------+------------+------------+ Right  1.11       1.22       1.06                     +-------+-----------+-----------+------------+------------+ Left   0.72       0.53       0.72                     +-------+-----------+-----------+------------+------------+ Bilateral ABIs appear essentially unchanged compared to prior study on 03/2016.  Summary: Right: Resting right ankle-brachial index is within normal range. No evidence of significant right lower extremity arterial disease. The right toe-brachial index is normal. Left: Resting left ankle-brachial index indicates moderate left lower extremity arterial disease. The left toe-brachial index is abnormal.  *See table(s) above for measurements and observations.  Vascular consult recommended. Electronically signed by Carlyle Dolly MD on 09/19/2018 at 3:48:50 PM.    Final    Vas [place] Lower Extremity Arterial Duplex  Result Date: 09/19/2018 LOWER EXTREMITY ARTERIAL DUPLEX STUDY Indications: Claudication, and Patient complains of left hip and leg pain after              walking less than one block. This has been ongoing for several              years. High Risk Factors: Hypertension, hyperlipidemia, current smoker, coronary artery                    disease.  Current ABI: Today the right ABI was 1.11 and the left 0.72 Performing Technologist: Wilkie Aye RVT  Examination Guidelines: A complete evaluation includes B-mode imaging, spectral Doppler, color Doppler, and power Doppler as needed of all accessible portions of each  vessel. Bilateral testing is considered an integral part of a complete examination. Limited examinations for reoccurring indications may be performed as noted.  Right Duplex Findings:  +-----------+--------+-----+--------+---------+-------------+            PSV cm/sRatioStenosisWaveform Comments      +-----------+--------+-----+--------+---------+-------------+ CIA Prox   233                  biphasic >50% stenosis +-----------+--------+-----+--------+---------+-------------+ CFA Prox   157                  biphasic               +-----------+--------+-----+--------+---------+-------------+ CFA Distal 83                   triphasic              +-----------+--------+-----+--------+---------+-------------+ DFA        106                  biphasic               +-----------+--------+-----+--------+---------+-------------+ SFA Prox   127                  biphasic               +-----------+--------+-----+--------+---------+-------------+ SFA Mid    95                   biphasic               +-----------+--------+-----+--------+---------+-------------+ SFA Distal 84                   biphasic               +-----------+--------+-----+--------+---------+-------------+ POP Prox   67                   biphasic               +-----------+--------+-----+--------+---------+-------------+ POP Distal 70                   biphasic               +-----------+--------+-----+--------+---------+-------------+ ATA Prox   38                   biphasic               +-----------+--------+-----+--------+---------+-------------+ PTA Distal 67                   biphasic               +-----------+--------+-----+--------+---------+-------------+ PERO Distal37                   biphasic               +-----------+--------+-----+--------+---------+-------------+  Left Duplex Findings: +-----------+--------+-----+--------+----------+-------------------------------+            PSV cm/sRatioStenosisWaveform  Comments                        +-----------+--------+-----+--------+----------+-------------------------------+ CIA Prox    176                  biphasic  High grade stenosis versus  occlusion.                      +-----------+--------+-----+--------+----------+-------------------------------+ CIA Distal 116                  monophasic                                +-----------+--------+-----+--------+----------+-------------------------------+ EIA Mid    73                   monophasic                                +-----------+--------+-----+--------+----------+-------------------------------+ EIA Distal 60                   monophasic                                +-----------+--------+-----+--------+----------+-------------------------------+ CFA Prox   70                   monophasic                                +-----------+--------+-----+--------+----------+-------------------------------+ CFA Distal 29                   monophasic                                +-----------+--------+-----+--------+----------+-------------------------------+ DFA        26                   monophasic                                +-----------+--------+-----+--------+----------+-------------------------------+ SFA Prox   51                   monophasic                                +-----------+--------+-----+--------+----------+-------------------------------+ SFA Mid    42                   biphasic                                  +-----------+--------+-----+--------+----------+-------------------------------+ SFA Distal 54                   biphasic                                  +-----------+--------+-----+--------+----------+-------------------------------+ POP Prox   24                   monophasic                                +-----------+--------+-----+--------+----------+-------------------------------+ POP Distal 26  monophasic                                 +-----------+--------+-----+--------+----------+-------------------------------+ ATA Prox   14                   monophasic                                +-----------+--------+-----+--------+----------+-------------------------------+ PTA Distal 23                   monophasic                                +-----------+--------+-----+--------+----------+-------------------------------+ PERO Distal10                   monophasic                                +-----------+--------+-----+--------+----------+-------------------------------+ Summary: Right: Atherosclerosis in the iliac, common femoral, femoral, popliteal and tibial arteries. >50% stenosis in the right common iliac artery. Left: Technically challenging study due to abdominal girth, bowel gas and flash artifact. Atherosclerosis in the iliac, common femoral, femoral, popliteal and tibial arteries. High grade stenosis versus occlusion of the common iliac artery. Monophasic waveform in the left lower extremity suggestive of inflow disease.   See table(s) above for measurements and observations. Vascular consult recommended. Electronically signed by Carlyle Dolly MD on 09/19/2018 at 3:52:35 PM.    Final    Disposition   Pt is being discharged home today in good condition.  Follow-up Plans & Appointments    Follow-up Information    Lorretta Harp, MD Follow up on 11/12/2018.   Specialties:  Cardiology, Radiology Why:  at 2:15pm for your follow up appt. Contact information: [address] [place] [address] [phone]        [facility] [address] Follow up on 11/04/2018.   Specialty:  Cardiology Why:  at 10am for your follow up dopplers. Contact information: [address] [id number] [place] [phone]         Discharge Instructions    Call MD for:  redness, tenderness, or signs of infection (pain, swelling, redness,  odor or green/yellow discharge around incision site)   Complete by:  As directed    Diet - low sodium heart healthy   Complete by:  As directed    Discharge instructions   Complete by:  As directed    Groin Site Care Refer to this sheet in the next few weeks. These instructions provide you with information on caring for yourself after your procedure. Your caregiver may also give you more specific instructions. Your treatment has been planned according to current medical practices, but problems sometimes occur. Call your caregiver if you have any problems or questions after your procedure. HOME CARE INSTRUCTIONS You may shower 24 hours after the procedure. Remove the bandage (dressing) and gently wash the site with plain soap and water. Gently pat the site dry.  Do not apply powder or lotion to the site.  Do not sit in a bathtub, swimming pool, or whirlpool for 5 to 7 days.  No bending, squatting, or lifting anything over 10 pounds (4.5 kg) as directed by  your caregiver.  Inspect the site at least twice daily.  Do not drive home if you are discharged the same day of the procedure. Have someone else drive you.  You may drive 24 hours after the procedure unless otherwise instructed by your caregiver.  What to expect: Any bruising will usually fade within 1 to 2 weeks.  Blood that collects in the tissue (hematoma) may be painful to the touch. It should usually decrease in size and tenderness within 1 to 2 weeks.  SEEK IMMEDIATE MEDICAL CARE IF: You have unusual pain at the groin site or down the affected leg.  You have redness, warmth, swelling, or pain at the groin site.  You have drainage (other than a small amount of blood on the dressing).  You have chills.  You have a fever or persistent symptoms for more than 72 hours.  You have a fever and your symptoms suddenly get worse.  Your leg becomes pale, cool, tingly, or numb.  You have heavy bleeding from the site. Hold pressure on the site. .    Increase activity slowly   Complete by:  As directed       Discharge Medications     Medication List    STOP taking these medications   ibuprofen 200 MG tablet Commonly known as:  ADVIL,MOTRIN     TAKE these medications   aspirin 81 MG EC tablet Take 1 tablet (81 mg total) by mouth daily.   atorvastatin 80 MG tablet Commonly known as:  LIPITOR TAKE 1 TABLET BY MOUTH EVERY DAY What changed:  when to take this   clopidogrel 75 MG tablet Commonly known as:  PLAVIX Take 1 tablet (75 mg total) by mouth daily. What changed:  when to take this   dicyclomine 10 MG capsule Commonly known as:  BENTYL TAKE 1 CAPSULE BY MOUTH AS NEEDED FOR UP TO 1 DOSE FOR SPASMS. What changed:  See the new instructions.   esomeprazole 40 MG capsule Commonly known as:  NEXIUM Take 1 capsule (40 mg total) by mouth 2 (two) times daily before a meal.   estradiol 0.1 mg/24hr patch Commonly known as:  CLIMARA - Dosed in mg/24 hr Place 0.1 mg onto the skin every Monday.   Evolocumab 140 MG/ML Soaj Commonly known as:  Repatha SureClick Inject 1 Dose into the skin every 14 (fourteen) days. What changed:  how much to take   ezetimibe 10 MG tablet Commonly known as:  ZETIA TAKE 1 TABLET BY MOUTH EVERY DAY What changed:  when to take this   linaclotide 145 MCG Caps capsule Commonly known as:  Linzess Take 1 capsule (145 mcg total) by mouth daily. What changed:    when to take this  reasons to take this   lisinopril 20 MG tablet Commonly known as:  PRINIVIL,ZESTRIL Take 1 tablet (20 mg total) by mouth daily. What changed:  when to take this   LORazepam 0.5 MG tablet Commonly known as:  ATIVAN Take 0.5 mg by mouth at bedtime.   sertraline 50 MG tablet Commonly known as:  ZOLOFT Take 50 mg by mouth at bedtime.   sodium chloride 0.65 % Soln nasal spray Commonly known as:  OCEAN Place 1 spray into both nostrils daily as needed for congestion.        Acute coronary syndrome (MI,  NSTEMI, STEMI, etc) this admission?: No.     Outstanding Labs/Studies   Follow up dopplers  Duration of Discharge Encounter   Greater than 30  minutes including physician time.  Signed, Reino Bellis NP-C 10/15/2018, 8:51 AM  Patient seen, examined. Available data reviewed. Agree with findings, assessment, and plan as outlined by Reino Bellis, NP-C.  On my exam this morning, the patient has clear bilateral groin sites with no evidence of hematoma or ecchymosis.  There is no leg swelling.  Both feet are warm.  She is stable for discharge after undergoing peripheral vascular intervention yesterday.  Follow-up is planned as outlined above.  All of her questions are answered.  Postprocedural restrictions are reviewed with her.  She is taking aspirin and clopidogrel.  States that aspirin "bothers her stomach" but she is willing to try aspirin 81 mg enteric-coated x30 days.  After that I think she could be maintained on clopidogrel alone.  Sherren Mocha, M.D. 10/15/2018 10:36 AM

## 2018-10-17 ENCOUNTER — Telehealth: Payer: Self-pay

## 2018-10-17 NOTE — Telephone Encounter (Signed)
COVID-19 Pre-Screening Questions:  Provider: HILTY   Needs f/u  6 MONTHS  . Have you been in contact with someone that was recently sick with fever/cough or confirmed to have the [place] virus?  NO  *Contact with a confirmed case should stay at home, away from confirmed patient, monitor symptoms, and reach out to PCP for e-visit/additional testing.  2. Do you have any of the following symptoms [cough, fever (100.4 or greater)], and/or shortness of breath)?  NO  *ALL PTS W/ FEVER SHOULD BE REFERRED TO PCP FOR E-VISIT* _________________________________________________  Cardiac Questionnaire:    Since your last visit or hospitalization:    1. Have you been having chest pain? NO   2. Have you been having shortness of breath? NO   3. Have you been having increasing edema, wt gain, or increase in abdominal girth (pants fitting more tightly)? NO   4. Have you had any passing out spells? NO    *A YES to any of these questions would result in the appointment being kept.  *If all the answers to these questions are NO, we should indicate that given the current situation regarding the worldwide coronarvirus pandemic, at the recommendation of the CDC, we are looking to limit gatherings in our waiting area, and thus will reschedule their appointment beyond four weeks from today.    --------------------------------------------------------------- Follow up 6 months - lexiscan was low risk, she was notified. I did pick up on PAD and she saw Dr. Gwenlyn Found and had a stent. Thanks.   Dr. Lemmie Evens

## 2018-10-23 ENCOUNTER — Ambulatory Visit: Payer: Medicare HMO | Admitting: Internal Medicine

## 2018-11-04 ENCOUNTER — Other Ambulatory Visit: Payer: Self-pay

## 2018-11-04 ENCOUNTER — Ambulatory Visit (HOSPITAL_BASED_OUTPATIENT_CLINIC_OR_DEPARTMENT_OTHER)
Admission: RE | Admit: 2018-11-04 | Discharge: 2018-11-04 | Disposition: A | Discharge status: Home / Self Care | Payer: Medicare HMO | Source: Ambulatory Visit | Attending: Cardiovascular Disease | Admitting: Cardiovascular Disease

## 2018-11-04 ENCOUNTER — Other Ambulatory Visit: Payer: Self-pay | Admitting: Cardiovascular Disease

## 2018-11-04 ENCOUNTER — Ambulatory Visit (HOSPITAL_COMMUNITY)
Admission: RE | Admit: 2018-11-04 | Discharge: 2018-11-04 | Disposition: A | Discharge status: Home / Self Care | Payer: Medicare HMO | Source: Ambulatory Visit | Attending: Cardiology | Admitting: Cardiology

## 2018-11-04 DIAGNOSIS — I739 Peripheral vascular disease, unspecified: Secondary | ICD-10-CM

## 2018-11-04 DIAGNOSIS — Z95820 Peripheral vascular angioplasty status with implants and grafts: Secondary | ICD-10-CM

## 2018-11-07 ENCOUNTER — Telehealth: Payer: Self-pay | Admitting: Cardiovascular Disease

## 2018-11-07 ENCOUNTER — Telehealth: Payer: Self-pay | Admitting: *Deleted

## 2018-11-07 ENCOUNTER — Other Ambulatory Visit: Payer: Self-pay

## 2018-11-07 DIAGNOSIS — I739 Peripheral vascular disease, unspecified: Secondary | ICD-10-CM

## 2018-11-07 NOTE — Telephone Encounter (Signed)
Patient call

## 2018-11-07 NOTE — Progress Notes (Signed)
Notes recorded by Lorretta Harp, MD on 11/04/2018 at 6:02 PM EDT Increased LABI since intervention. Repeat 6 months

## 2018-11-07 NOTE — Progress Notes (Signed)
Notes recorded by Lorretta Harp, MD on 11/07/2018 at 9:01 AM EDT Marked improvement in left iliac velocities. Repeat 6 months

## 2018-11-07 NOTE — Telephone Encounter (Signed)
New Message            Patient is calling in today because she got a call from some one stating that she was in "[place]" and her test didn't look as good as they did before she had her surgery. Patient is concerned that this was a scam and would like a call back ASAP to straighten this out. Pls call to advise!!

## 2018-11-11 NOTE — Telephone Encounter (Signed)
Notes recorded by Lorretta Harp, MD on 11/04/2018 at 6:02 PM EDT Increased LABI since intervention. Repeat 6 months Informed pt of results on Thursday  and she stated that she understood we were going to Rescan in 6 months.  Left detailed message with result again and will see if pt calls back

## 2018-11-12 ENCOUNTER — Ambulatory Visit: Payer: Medicare HMO | Admitting: Cardiovascular Disease

## 2018-12-06 IMAGING — US US EXTREM  UP VENOUS*R*
1 series · 13 of 24 positions shown · non-contrast
Comparison: None.

CLINICAL DATA: Right upper extremity swelling pain and pruritus.
Flu shot 3 days ago.



[Series 1: us extrem up venous*right* · 0.07mm/px · 13 of 27 slices shown]
[im 1/27]
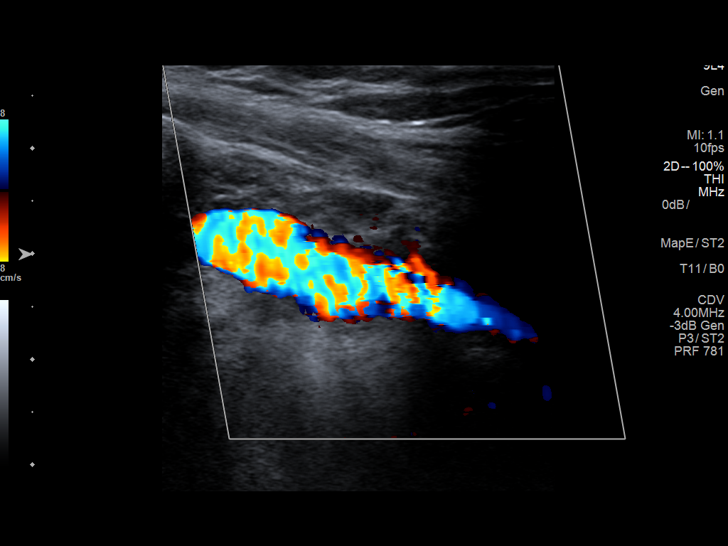
[im 3/27]
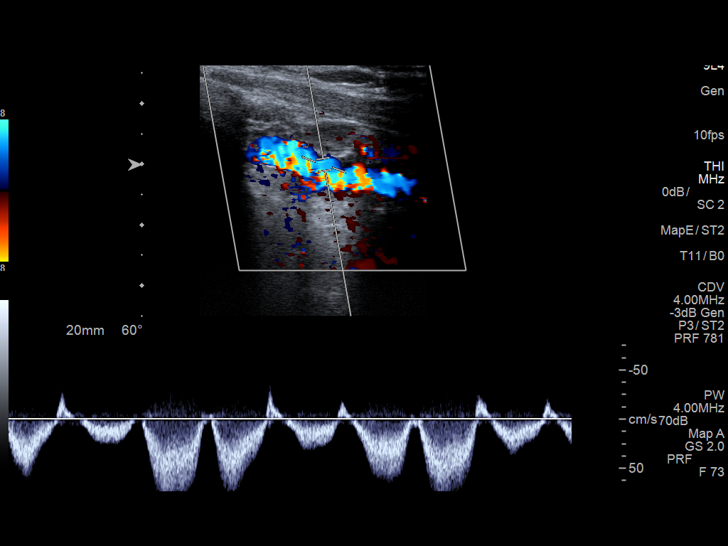
[im 5/27]
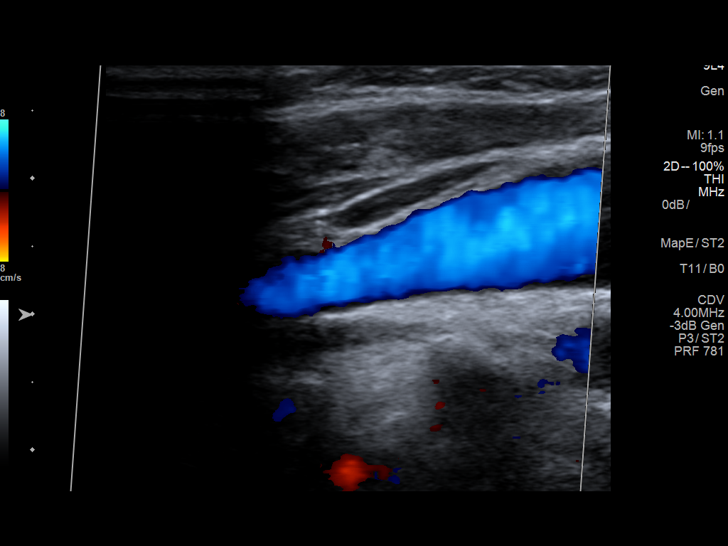
[im 7/27]
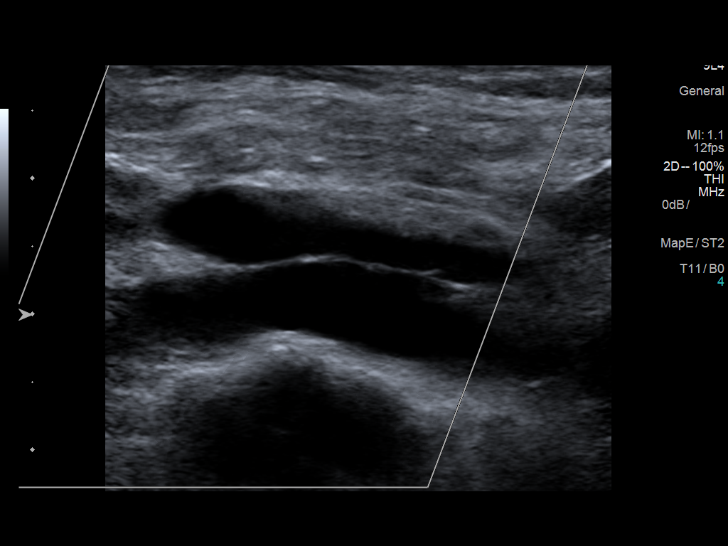
[im 10/27]
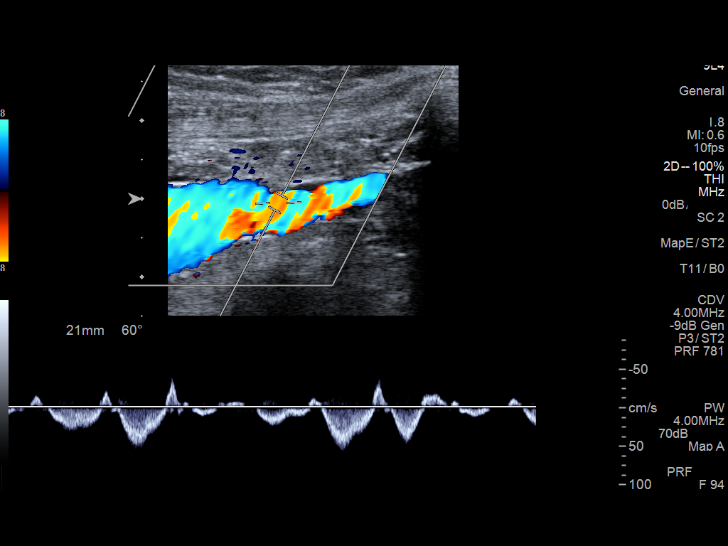
[im 12/27]
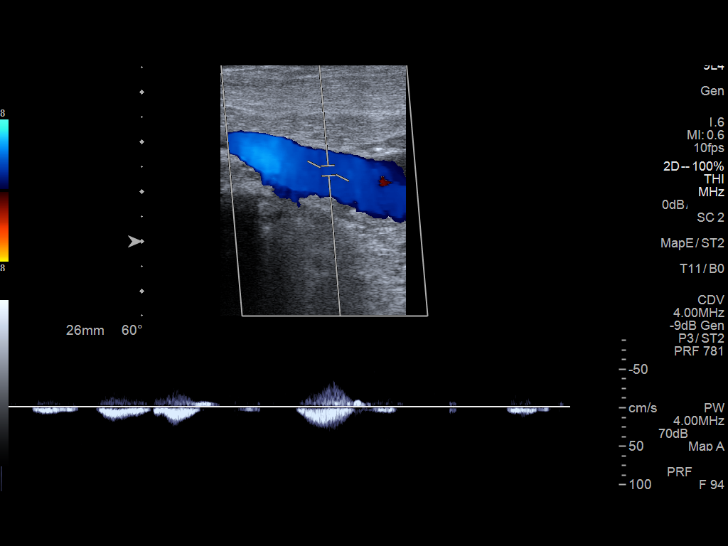
[im 14/27]
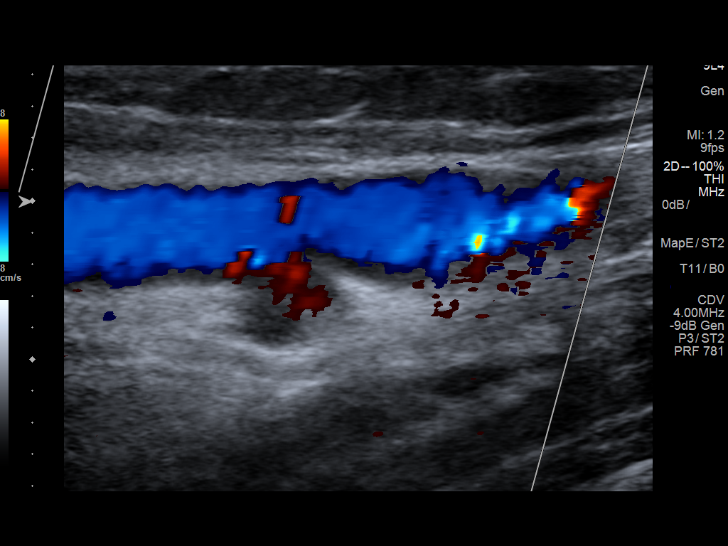
[im 15/27]
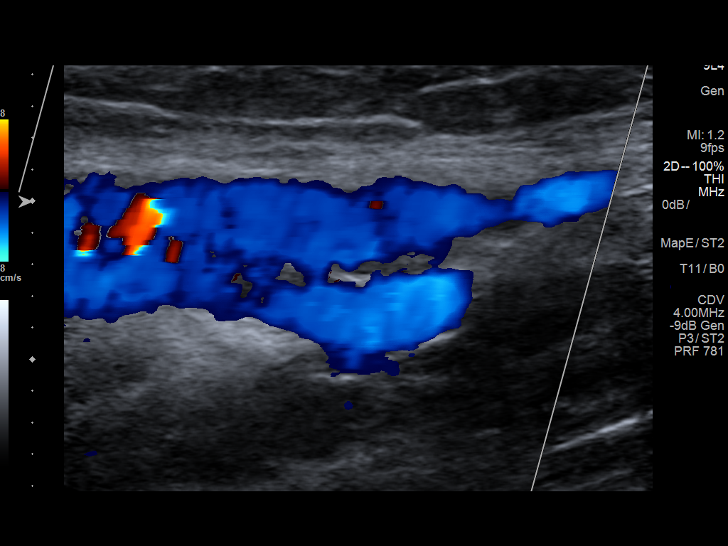
[im 17/27]
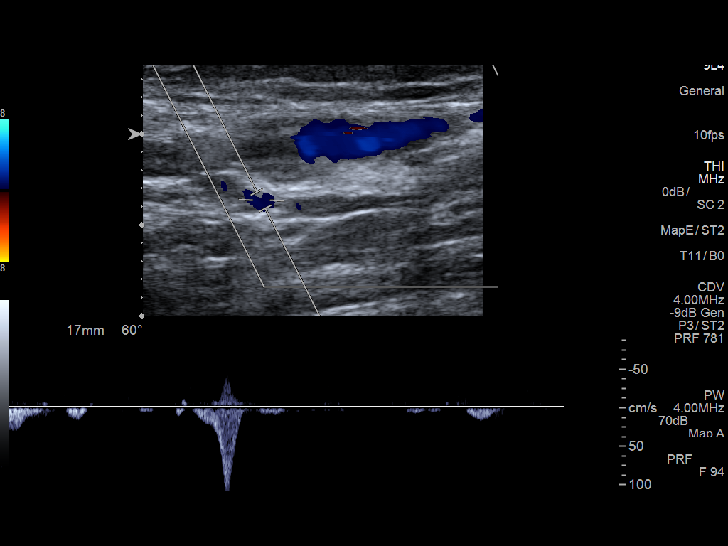
[im 20/27]
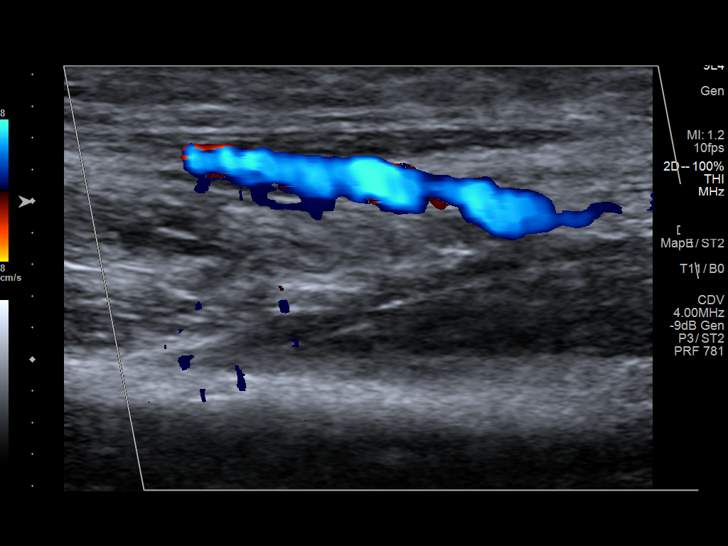
[im 22/27]
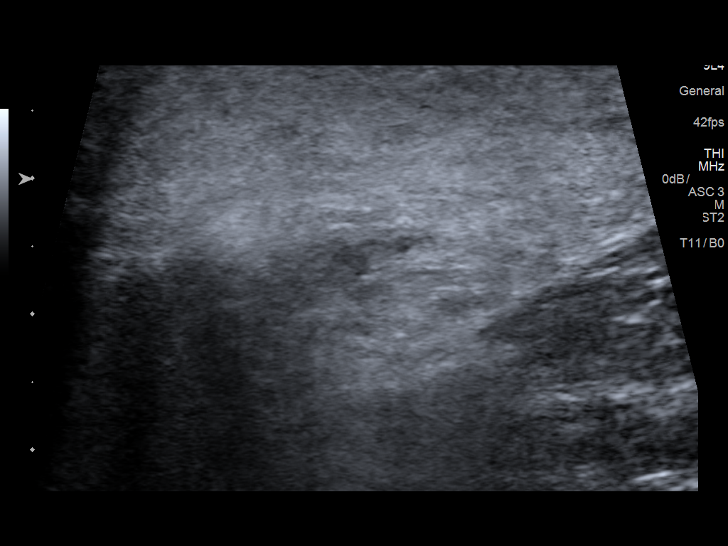
[im 24/27]
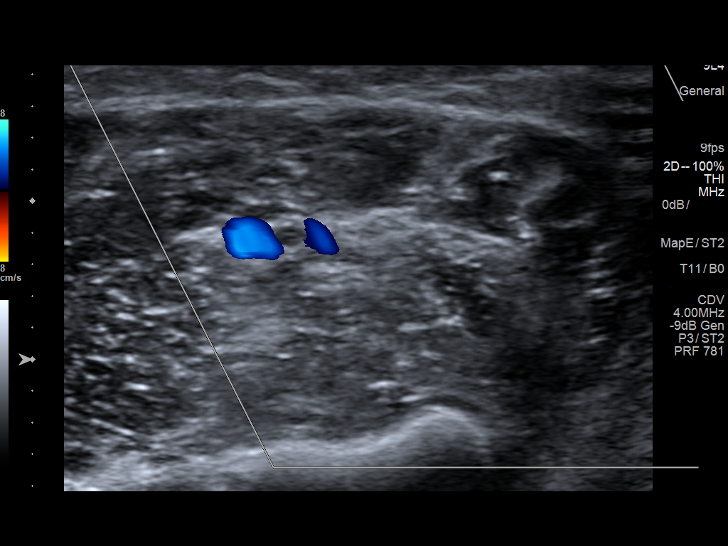
[im 27/27]
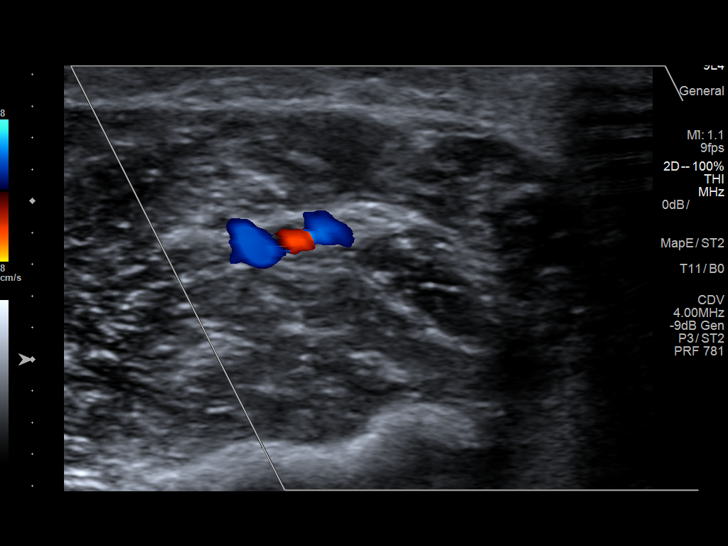

[13 of 24 positions shown; findings below may reference images not displayed]

FINDINGS: Contralateral Subclavian Vein: Respiratory phasicity is normal and
symmetric with the symptomatic side. No evidence of thrombus. Normal
compressibility.

Internal Jugular Vein: No evidence of thrombus. Normal
compressibility, respiratory phasicity and response to augmentation.

Subclavian Vein: No evidence of thrombus. Normal compressibility,
respiratory phasicity and response to augmentation.

Axillary Vein: No evidence of thrombus. Normal compressibility,
respiratory phasicity and response to augmentation.

Cephalic Vein: Not visualized

Basilic Vein: Not visualized

Brachial Veins: No evidence of thrombus. Normal compressibility,
respiratory phasicity and response to augmentation.

Radial Veins: No evidence of thrombus. Normal compressibility,
respiratory phasicity and response to augmentation.

Ulnar Veins: No evidence of thrombus. Normal compressibility,
respiratory phasicity and response to augmentation.

Venous Reflux:  None visualized.

Other Findings:  None visualized.
IMPRESSION: No evidence of deep venous thrombosis.

## 2018-12-16 ENCOUNTER — Telehealth: Payer: Self-pay | Admitting: Cardiovascular Disease

## 2018-12-16 NOTE — Telephone Encounter (Signed)
She is supposed to see me back in the office post iliac stenting.  Perhaps we can make an appointment for her to see him back next month.

## 2018-12-16 NOTE — Telephone Encounter (Signed)
New Message   Patient is calling because she had a stent placed on 3/16. She is not sure when she should follow up with Dr. Gwenlyn Found. Please call to discuss

## 2018-12-16 NOTE — Telephone Encounter (Signed)
Pt calling to see when she was suppose to follow up with Dr. Gwenlyn Found after stent placement on 3/16. Per chart review, pt was recommended to have an LEA and ABI within 1 week and follow up with MD in 2-3 weeks. Pt report she had dopplers done but appointment was cancelled due to [place]. She is inquiring if appointment need to be made. Will route to MD and Nurse.

## 2018-12-17 NOTE — Telephone Encounter (Signed)
Follow up   Patient wants to know when an office visit can be setup per the previous message. Please call the patient.

## 2018-12-18 DIAGNOSIS — R102 Pelvic and perineal pain: Secondary | ICD-10-CM | POA: Diagnosis not present

## 2018-12-19 ENCOUNTER — Other Ambulatory Visit: Payer: Self-pay | Admitting: Family Medicine

## 2018-12-19 DIAGNOSIS — K219 Gastro-esophageal reflux disease without esophagitis: Secondary | ICD-10-CM

## 2018-12-30 ENCOUNTER — Other Ambulatory Visit: Payer: Self-pay | Admitting: Family Medicine

## 2018-12-30 DIAGNOSIS — I10 Essential (primary) hypertension: Secondary | ICD-10-CM

## 2019-01-01 ENCOUNTER — Ambulatory Visit (INDEPENDENT_AMBULATORY_CARE_PROVIDER_SITE_OTHER): Payer: Medicare HMO | Admitting: Family Medicine

## 2019-01-01 ENCOUNTER — Telehealth: Payer: Self-pay | Admitting: *Deleted

## 2019-01-01 ENCOUNTER — Encounter: Payer: Self-pay | Admitting: Family Medicine

## 2019-01-01 DIAGNOSIS — I739 Peripheral vascular disease, unspecified: Secondary | ICD-10-CM | POA: Diagnosis not present

## 2019-01-01 DIAGNOSIS — I10 Essential (primary) hypertension: Secondary | ICD-10-CM | POA: Diagnosis not present

## 2019-01-01 DIAGNOSIS — E785 Hyperlipidemia, unspecified: Secondary | ICD-10-CM

## 2019-01-01 MED ORDER — ATORVASTATIN CALCIUM 80 MG PO TABS: 80.0000 mg | ORAL_TABLET | Freq: Every day | ORAL | 1 refills | Status: DC

## 2019-01-01 MED ORDER — EZETIMIBE 10 MG PO TABS: 10.0000 mg | ORAL_TABLET | Freq: Every day | ORAL | 1 refills | Status: DC

## 2019-01-01 NOTE — Telephone Encounter (Signed)
Left message on machine to set up virtual follow up visit.  Past due.

## 2019-01-01 NOTE — Progress Notes (Signed)
Virtual Visit via Video Note  I connected with Leah [name] on 01/01/19 at  2:00 PM EDT by a video enabled telemedicine application and verified that I am speaking with the correct person using two identifiers.  Location: Patient: home Provider: home   I discussed the limitations of evaluation and management by telemedicine and the availability of in person appointments. The patient expressed understanding and agreed to proceed.  History of Present Illness: Pt is home with no complaints.  She is waiting for an appointment with cardiology but states she is having no problems since her surgery.   Past Medical History:  Diagnosis Date  . Allergy   . Anemia   . Anxiety   . Barrett esophagus   . Bladder pain   . Chronic bronchitis ([place])    per pt last episode july 2016  . Complication of anesthesia    slow to wake  . COPD (chronic obstructive pulmonary disease) ([place])   . Depression   . Diverticulosis   . Frequency of urination   . GERD (gastroesophageal reflux disease)   . Hiatal hernia   . History of adenomatous polyp of colon    tubular adenoma  . History of chronic gastritis   . History of diverticulitis of colon   . History of esophageal dilatation    for stricture  . History of esophageal spasm   . Hyperlipidemia   . Hypertension   . IBS (irritable bowel syndrome)   . Interstitial cystitis   . PAD (peripheral artery disease) ([place])   . Productive cough    intermittant  . Smokers' cough ([place])   . Urgency of urination    Current Outpatient Medications on File Prior to Visit  Medication Sig Dispense Refill  . aspirin EC 81 MG EC tablet Take 1 tablet (81 mg total) by mouth daily.    . clopidogrel (PLAVIX) 75 MG tablet Take 1 tablet (75 mg total) by mouth daily. (Patient taking differently: Take 75 mg by mouth every evening. ) 90 tablet 3  . dicyclomine (BENTYL) 10 MG capsule TAKE 1 CAPSULE BY MOUTH AS NEEDED FOR UP TO 1 DOSE FOR SPASMS. (Patient taking differently:  Take 10 mg by mouth 2 (two) times daily as needed for spasms. ) 60 capsule 2  . esomeprazole (NEXIUM) 40 MG capsule Take 1 capsule (40 mg total) by mouth 2 (two) times daily before a meal. 180 capsule 1  . estradiol (CLIMARA - DOSED IN MG/24 HR) 0.1 mg/24hr patch Place 0.1 mg onto the skin every Monday.  0  . Evolocumab (REPATHA SURECLICK) 481 MG/ML SOAJ Inject 1 Dose into the skin every 14 (fourteen) days. (Patient taking differently: Inject 140 mg into the skin every 14 (fourteen) days. ) 2 pen 11  . linaclotide (LINZESS) 145 MCG CAPS capsule Take 1 capsule (145 mcg total) by mouth daily. (Patient taking differently: Take 145 mcg by mouth daily as needed (constipation). ) 14 capsule 1  . lisinopril (ZESTRIL) 20 MG tablet Take 1 tablet (20 mg total) by mouth daily. Needs ov 90 tablet 0  . LORazepam (ATIVAN) 0.5 MG tablet Take 0.5 mg by mouth at bedtime.   5  . sertraline (ZOLOFT) 50 MG tablet Take 50 mg by mouth at bedtime.    . sodium chloride (OCEAN) 0.65 % SOLN nasal spray Place 1 spray into both nostrils daily as needed for congestion.     No current facility-administered medications on file prior to visit.      Observations/Objective:  bp 127/82  p 84   Afebrile  Pt is in NAD   Assessment and Plan: 1. Hyperlipidemia, unspecified hyperlipidemia type con't with cardiology Pt on lipitor and repatha  Check labs  - Lipid panel; Future - Comprehensive metabolic panel; Future - Lipoprotein A (LPA); Future - atorvastatin (LIPITOR) 80 MG tablet; Take 1 tablet (80 mg total) by mouth daily.  Dispense: 90 tablet; Refill: 1  2. Essential hypertension Well controlled, no changes to meds. Encouraged heart healthy diet such as the DASH diet and exercise as tolerated.   - Lipid panel; Future - Comprehensive metabolic panel; Future - Lipoprotein A (LPA); Future  3. Intermittent claudication (HCC)   Follow Up Instructions:    I discussed the assessment and treatment plan with the patient.  The patient was provided an opportunity to ask questions and all were answered. The patient agreed with the plan and demonstrated an understanding of the instructions.   The patient was advised to call back or seek an in-person evaluation if the symptoms worsen or if the condition fails to improve as anticipated.  I provided 25 minutes of non-face-to-face time during this encounter.   [name], DO

## 2019-01-01 NOTE — Assessment & Plan Note (Signed)
On plavix  F/u cardiology

## 2019-01-06 ENCOUNTER — Telehealth: Payer: Self-pay | Admitting: *Deleted

## 2019-01-06 ENCOUNTER — Other Ambulatory Visit: Payer: Self-pay

## 2019-01-06 DIAGNOSIS — K219 Gastro-esophageal reflux disease without esophagitis: Secondary | ICD-10-CM

## 2019-01-06 MED ORDER — ESOMEPRAZOLE MAGNESIUM 40 MG PO CPDR: 40.0000 mg | DELAYED_RELEASE_CAPSULE | Freq: Two times a day (BID) | ORAL | 0 refills | Status: DC

## 2019-01-06 NOTE — Telephone Encounter (Signed)
Refill sent.

## 2019-01-06 NOTE — Telephone Encounter (Signed)
Copied from [place]. Topic: General - Other >> Jan 03, 2019 10:56 AM Leward Quan A wrote: Reason for CRM: Patient called to say that she is still taking the esomeprazole (NEXIUM) 40 MG capsule but received a letter that the refill request was denied by Dr Carollee Herter she stated that she had some left because they kept sending her 90 day supplies. She is now down to 10 pills and will need a refill soon please advise

## 2019-01-07 ENCOUNTER — Other Ambulatory Visit (INDEPENDENT_AMBULATORY_CARE_PROVIDER_SITE_OTHER): Payer: Medicare HMO

## 2019-01-07 ENCOUNTER — Other Ambulatory Visit: Payer: Self-pay

## 2019-01-07 DIAGNOSIS — I10 Essential (primary) hypertension: Secondary | ICD-10-CM | POA: Diagnosis not present

## 2019-01-07 DIAGNOSIS — E785 Hyperlipidemia, unspecified: Secondary | ICD-10-CM

## 2019-01-07 LAB — COMPREHENSIVE METABOLIC PANEL
ALT: 8 U/L (ref 0–35)
AST: 12 U/L (ref 0–37)
Albumin: 3.9 g/dL (ref 3.5–5.2)
Alkaline Phosphatase: 80 U/L (ref 39–117)
BUN: 9 mg/dL (ref 6–23)
CO2: 31 mEq/L (ref 19–32)
Calcium: 8.8 mg/dL (ref 8.4–10.5)
Chloride: 105 mEq/L (ref 96–112)
Creatinine, Ser: 0.86 mg/dL (ref 0.40–1.20)
GFR: 65.12 mL/min (ref >60.00)
Glucose, Bld: 92 mg/dL (ref 70–99)
Potassium: 4.1 mEq/L (ref 3.5–5.1)
Sodium: 141 mEq/L (ref 135–145)
Total Bilirubin: 0.3 mg/dL (ref 0.2–1.2)
Total Protein: 6.1 g/dL (ref 6.0–8.3)

## 2019-01-07 LAB — LIPID PANEL
Cholesterol: 65 mg/dL (ref 0–200)
HDL: 33.8 mg/dL — ABNORMAL LOW (ref >39.00)
LDL Cholesterol: 13 mg/dL (ref 0–99)
NonHDL: 31.49
Total CHOL/HDL Ratio: 2
Triglycerides: 90 mg/dL (ref 0.0–149.0)
VLDL: 18 mg/dL (ref 0.0–40.0)

## 2019-01-11 LAB — LIPOPROTEIN A (LPA): Lipoprotein (a): <10 nmol/L (ref <75)

## 2019-01-15 ENCOUNTER — Telehealth: Payer: Self-pay | Admitting: Family Medicine

## 2019-01-15 NOTE — Progress Notes (Signed)
Letter sent.

## 2019-01-15 NOTE — Telephone Encounter (Signed)
Pt given lab results from 01/07/19. Pt verbalized understanding. Unable to document in result note.   Pt also voices complaints of the gland on the right side of neck feeling swollen and her ears feeling full for approximately 1 week. Pt states she has experienced sinus drainage and feels a "little SOB every once in a while". Pt has complaints of right shoulder hurting that radiates into her ribs. Pt denies any fever or cough at this time.  Pt advised that she would need to be scheduled for an office visit once the office opens on tomorrow. Pt advised to seek treatment in the ED for worsening SOB during the night. Pt verbalized understanding. Pt states she does not have computer access or a smart phone. Pt can be contacted at [phone] to be scheduled for an appt.

## 2019-01-16 ENCOUNTER — Telehealth: Payer: Self-pay | Admitting: Family Medicine

## 2019-01-16 NOTE — Telephone Encounter (Signed)
Pt notified of labs

## 2019-01-16 NOTE — Telephone Encounter (Signed)
Attempted to reach pt to schedule a Virtual Visit and left message for pt to return my call to schedule an appointment. Below note says pt does not have computer access or a smart phone but her chart indicates she had a virtual visit on 01/07/19 with PCP.

## 2019-01-16 NOTE — Telephone Encounter (Signed)
Pt LV wanting to know lab results, please advise.     Copied from [place] [id number]. Topic: Quick Communication - Lab Results (Clinic Use ONLY) >> Jan 15, 2019 12:43 PM Scherrie Gerlach wrote: Pt would like a call back about her labs done 01/07/19.  Pt states she usually gets a call.

## 2019-01-21 ENCOUNTER — Other Ambulatory Visit: Payer: Self-pay

## 2019-01-21 ENCOUNTER — Encounter: Payer: Self-pay | Admitting: Family Medicine

## 2019-01-21 ENCOUNTER — Ambulatory Visit (INDEPENDENT_AMBULATORY_CARE_PROVIDER_SITE_OTHER): Payer: Medicare HMO | Admitting: Family Medicine

## 2019-01-21 VITALS — BP 120/68 | HR 83 | Temp 99.1°F | Resp 16 | Ht 61.0 in | Wt 116.0 lb

## 2019-01-21 DIAGNOSIS — H6501 Acute serous otitis media, right ear: Secondary | ICD-10-CM | POA: Diagnosis not present

## 2019-01-21 DIAGNOSIS — H60391 Other infective otitis externa, right ear: Secondary | ICD-10-CM | POA: Diagnosis not present

## 2019-01-21 DIAGNOSIS — M940 Chondrocostal junction syndrome [Tietze]: Secondary | ICD-10-CM | POA: Diagnosis not present

## 2019-01-21 MED ORDER — TIZANIDINE HCL 2 MG PO TABS: 4.0000 mg | ORAL_TABLET | Freq: Three times a day (TID) | ORAL | Status: DC | PRN

## 2019-01-21 MED ORDER — LEVOCETIRIZINE DIHYDROCHLORIDE 5 MG PO TABS: 5.0000 mg | ORAL_TABLET | Freq: Every evening | ORAL | 5 refills | Status: DC

## 2019-01-21 MED ORDER — DOXYCYCLINE HYCLATE 100 MG PO TABS: 100.0000 mg | ORAL_TABLET | Freq: Two times a day (BID) | ORAL | 0 refills | Status: DC

## 2019-01-21 NOTE — Patient Instructions (Signed)

## 2019-01-21 NOTE — Progress Notes (Signed)
Patient ID: Leah [name], female    DOB: 1946-07-16  Age: 73 y.o. MRN: [id number]    Subjective:  Subjective  HPI Leah [name] presents for R rib pain x several days.  No cough.  No known injury Pt also c/o R ear pain.  Ear feels full like it wants to pop.  No congestion.    Review of Systems  Constitutional: Negative for appetite change, diaphoresis, fatigue and unexpected weight change.  HENT: Positive for ear pain. Negative for congestion, facial swelling, hearing loss, mouth sores, nosebleeds, rhinorrhea and sinus pain.   Eyes: Negative for pain, redness and visual disturbance.  Respiratory: Negative for cough, chest tightness, shortness of breath and wheezing.   Cardiovascular: Negative for chest pain, palpitations and leg swelling.  Endocrine: Negative for cold intolerance, heat intolerance, polydipsia, polyphagia and polyuria.  Genitourinary: Negative for difficulty urinating, dysuria and frequency.  Neurological: Negative for dizziness, light-headedness, numbness and headaches.    History Past Medical History:  Diagnosis Date   Allergy    Anemia    Anxiety    Barrett esophagus    Bladder pain    Chronic bronchitis ([place])    per pt last episode july 2016   Complication of anesthesia    slow to wake   COPD (chronic obstructive pulmonary disease) (HCC)    Depression    Diverticulosis    Frequency of urination    GERD (gastroesophageal reflux disease)    Hiatal hernia    History of adenomatous polyp of colon    tubular adenoma   History of chronic gastritis    History of diverticulitis of colon    History of esophageal dilatation    for stricture   History of esophageal spasm    Hyperlipidemia    Hypertension    IBS (irritable bowel syndrome)    Interstitial cystitis    PAD (peripheral artery disease) (HCC)    Productive cough    intermittant   Smokers' cough ([place])    Urgency of urination     She has a past surgical  history that includes Carpal tunnel release (Right, 1993); Sinus surgery with [employer] (1991); laparoscopic appendectomy (N/A, 09/21/2013); Anterior cervical decomp/discectomy fusion (N/A, 09/07/2014); Tonsillectomy and adenoidectomy (1964); Laparoscopic cholecystectomy (2002); Abdominal hysterectomy (1975); Temporomandibular joint surgery (1990); LAPAROSCOPY SIGMOID COLECTOMY (10-05-2008); Microlaryngoscopy (09-27-2006); Colonoscopy (last one 04-30-2013); Esophagogastroduodenoscopy (last one 09-11-2012); CYSTO/  HOD/  BLADDER BX (1990's); cysto with hydrodistension (N/A, 06/08/2015); Lower Extremity Angiography (Left, 10/14/2018); PERIPHERAL VASCULAR INTERVENTION (10/14/2018); ABDOMINAL AORTOGRAM (N/A, 10/14/2018); and Lower Extremity Angiography (Bilateral, 10/14/2018).   Her family history includes Colon polyps in her mother; Diabetes in her father; Heart disease in her father; Hypertension in her father; Leukemia in her father; Pancreatic cancer in her father and mother; Prostate cancer in her father.She reports that she has been smoking cigarettes. She has a 47.00 pack-year smoking history. She has never used smokeless tobacco. She reports that she does not drink alcohol or use drugs.  Current Outpatient Medications on File Prior to Visit  Medication Sig Dispense Refill   aspirin EC 81 MG EC tablet Take 1 tablet (81 mg total) by mouth daily.     atorvastatin (LIPITOR) 80 MG tablet Take 1 tablet (80 mg total) by mouth daily. 90 tablet 1   clopidogrel (PLAVIX) 75 MG tablet Take 1 tablet (75 mg total) by mouth daily. (Patient taking differently: Take 75 mg by mouth every evening. ) 90 tablet 3   dicyclomine (BENTYL) 10 MG capsule TAKE  1 CAPSULE BY MOUTH AS NEEDED FOR UP TO 1 DOSE FOR SPASMS. (Patient taking differently: Take 10 mg by mouth 2 (two) times daily as needed for spasms. ) 60 capsule 2   esomeprazole (NEXIUM) 40 MG capsule Take 1 capsule (40 mg total) by mouth 2 (two) times daily before a meal.  180 capsule 0   estradiol (CLIMARA - DOSED IN MG/24 HR) 0.1 mg/24hr patch Place 0.1 mg onto the skin every Monday.  0   Evolocumab (REPATHA SURECLICK) 885 MG/ML SOAJ Inject 1 Dose into the skin every 14 (fourteen) days. (Patient taking differently: Inject 140 mg into the skin every 14 (fourteen) days. ) 2 pen 11   ezetimibe (ZETIA) 10 MG tablet Take 1 tablet (10 mg total) by mouth daily. 90 tablet 1   linaclotide (LINZESS) 145 MCG CAPS capsule Take 1 capsule (145 mcg total) by mouth daily. (Patient taking differently: Take 145 mcg by mouth daily as needed (constipation). ) 14 capsule 1   lisinopril (ZESTRIL) 20 MG tablet Take 1 tablet (20 mg total) by mouth daily. Needs ov 90 tablet 0   LORazepam (ATIVAN) 0.5 MG tablet Take 0.5 mg by mouth at bedtime.   5   sertraline (ZOLOFT) 50 MG tablet Take 50 mg by mouth at bedtime.     sodium chloride (OCEAN) 0.65 % SOLN nasal spray Place 1 spray into both nostrils daily as needed for congestion.     No current facility-administered medications on file prior to visit.      Objective:  Objective  Physical Exam Vitals signs and nursing note reviewed.  Constitutional:      Appearance: She is well-developed.  HENT:     Head: Normocephalic and atraumatic.     Right Ear: Tenderness present. A middle ear effusion is present. Tympanic membrane is erythematous and bulging. Tympanic membrane is not scarred or perforated. Tympanic membrane has decreased mobility.     Left Ear: Hearing, tympanic membrane, ear canal and external ear normal.     Nose: Nose normal. No congestion or rhinorrhea.  Eyes:     Conjunctiva/sclera: Conjunctivae normal.  Neck:     Musculoskeletal: Normal range of motion and neck supple.     Thyroid: No thyromegaly.     Vascular: No carotid bruit or JVD.  Cardiovascular:     Rate and Rhythm: Normal rate and regular rhythm.     Heart sounds: Normal heart sounds. No murmur.  Pulmonary:     Effort: Pulmonary effort is normal. No  respiratory distress.     Breath sounds: Normal breath sounds. No wheezing or rales.  Chest:     Chest wall: No tenderness.  Neurological:     Mental Status: She is alert and oriented to person, place, and time.    BP 120/68 (BP Location: Right Arm, Patient Position: Sitting, Cuff Size: Normal)    Pulse 83    Temp 99.1 F (37.3 C) (Oral)    Resp 16    Ht 5\' 1"  (1.549 m)    Wt 116 lb (52.6 kg)    SpO2 98%    BMI 21.92 kg/m  Wt Readings from Last 3 Encounters:  01/21/19 116 lb (52.6 kg)  10/15/18 120 lb 5.9 oz (54.6 kg)  09/25/18 118 lb 6.4 oz (53.7 kg)     Lab Results  Component Value Date   WBC 7.0 10/15/2018   HGB 10.9 (L) 10/15/2018   HCT 35.2 (L) 10/15/2018   PLT 205 10/15/2018   GLUCOSE 92 01/07/2019  CHOL 65 01/07/2019   TRIG 90.0 01/07/2019   HDL 33.80 (L) 01/07/2019   LDLDIRECT 151.0 11/07/2016   LDLCALC 13 01/07/2019   ALT 8 01/07/2019   AST 12 01/07/2019   NA 141 01/07/2019   K 4.1 01/07/2019   CL 105 01/07/2019   CREATININE 0.86 01/07/2019   BUN 9 01/07/2019   CO2 31 01/07/2019   TSH 1.92 03/30/2016   INR 1.0 05/21/2009   HGBA1C 6.1 12/18/2017   MICROALBUR <0.7 11/09/2014    Vas [place] Abi With/wo Tbi  Result Date: 11/06/2018 LOWER EXTREMITY DOPPLER STUDY Indications: In 08/2018, an arterial Doppler showed an ABI of 1.11 on the right              and .72 on the left. Patient c/o stable left hip and leg pain after              walking less than one block. This has been ongoing for several              years. She states there has been no improvement in her symptoms              post procedure. High Risk Factors: Hypertension, hyperlipidemia, Diabetes, current smoker.  Other Factors: COPD.  Vascular Interventions: Successful PTA and covered stenting of a calcified left                         common iliac artery CTO using covered VBX stents on                         10/14/2018. Performing Technologist: Sharlett Iles RVT  Examination Guidelines: A complete evaluation  includes at minimum, Doppler waveform signals and systolic blood pressure reading at the level of bilateral brachial, anterior tibial, and posterior tibial arteries, when vessel segments are accessible. Bilateral testing is considered an integral part of a complete examination. Photoelectric Plethysmograph (PPG) waveforms and toe systolic pressure readings are included as required and additional duplex testing as needed. Limited examinations for reoccurring indications may be performed as noted.  ABI Findings: +---------+------------------+-----+-----------+--------+  Right     Rt Pressure (mmHg) Index Waveform    Comment   +---------+------------------+-----+-----------+--------+  Brachial  138                                            +---------+------------------+-----+-----------+--------+  ATA       141                1.02  biphasic              +---------+------------------+-----+-----------+--------+  PTA       155                1.12  multiphasic           +---------+------------------+-----+-----------+--------+  PERO      132                0.96  triphasic             +---------+------------------+-----+-----------+--------+  Great Toe 70                 0.51  Abnormal              +---------+------------------+-----+-----------+--------+ +---------+------------------+-----+-----------+-------+  Left      Lt  Pressure (mmHg) Index Waveform    Comment  +---------+------------------+-----+-----------+-------+  Brachial  138                                           +---------+------------------+-----+-----------+-------+  ATA       151                1.09  biphasic             +---------+------------------+-----+-----------+-------+  PTA       164                1.19  multiphasic          +---------+------------------+-----+-----------+-------+  PERO      141                1.02  biphasic             +---------+------------------+-----+-----------+-------+  Great Toe 89                 0.64  Abnormal              +---------+------------------+-----+-----------+-------+ +-------+-----------+-----------+------------+------------+  ABI/TBI Today's ABI Today's TBI Previous ABI Previous TBI  +-------+-----------+-----------+------------+------------+  Right   1.12        .51         1.11         .72           +-------+-----------+-----------+------------+------------+  Left    1.19        .64         1.22         .53           +-------+-----------+-----------+------------+------------+ Right ABIs appear essentially unchanged compared to prior study on 09/17/2018. Left ABIs appear increased compared to prior study on 09/17/2018. Right TBIs appear decreased compared to prior study on 09/17/2018. Left TBIs appear slightly increased compared to prior study on 09/17/2018.  Summary: Right: Resting right ankle-brachial index is within normal range. No evidence of significant right lower extremity arterial disease. The right toe-brachial index is abnormal. Left: Resting left ankle-brachial index is within normal range. No evidence of significant left lower extremity arterial disease. The left toe-brachial index is abnormal.  *See table(s) above for measurements and observations.  Suggest follow up study in 6 months. Electronically signed by Quay Burow MD on 11/06/2018 at 4:56:16 PM.    Final    Vas US Aorta/ivc/iliacs  Result Date: 11/06/2018 ABDOMINAL AORTA STUDY Indications: In 08/2018, a lower arterial duplex showed >50% right common iliac              artery stenosis with no focal stenosis seen in the right leg.              Monophasic flow throughout the left leg suggesting left inflow              disease; high grade stenosis vs occlusion in the left common iliac              artery. Patient c/o stable left hip and leg pain after walking less              than one block. This has been ongoing for several years. She states              there has been no improvement in her symptoms post procedure. Risk  Factors: Hypertension,  hyperlipidemia, Diabetes, current smoker. Other Factors: COPD. Vascular Interventions: Successful PTA and covered stenting of a calcified left                         common iliac artery CTO using covered VBX stents on                         10/14/2018. Limitations: Air/bowel gas and flash artifact.   Performing Technologist: Sharlett Iles RVT  Examination Guidelines: A complete evaluation includes B-mode imaging, spectral Doppler, color Doppler, and power Doppler as needed of all accessible portions of each vessel. Bilateral testing is considered an integral part of a complete examination. Limited examinations for reoccurring indications may be performed as noted.  Abdominal Aorta Findings: +-------------+-------+----------+----------+-----------+--------+---------+  Location      AP (cm) Trans (cm) PSV (cm/s) Waveform    Thrombus Comments   +-------------+-------+----------+----------+-----------+--------+---------+  Proximal      1.90    1.90       68                                         +-------------+-------+----------+----------+-----------+--------+---------+  Mid                              103                                        +-------------+-------+----------+----------+-----------+--------+---------+  Distal                           88                                         +-------------+-------+----------+----------+-----------+--------+---------+  RT CIA Prox                      338        biphasic             turbulent  +-------------+-------+----------+----------+-----------+--------+---------+  RT CIA Mid                       163        multiphasic                     +-------------+-------+----------+----------+-----------+--------+---------+  RT CIA Distal                    126        triphasic                       +-------------+-------+----------+----------+-----------+--------+---------+  RT EIA Prox                      97         biphasic                         +-------------+-------+----------+----------+-----------+--------+---------+  LT EIA Prox  174        multiphasic                     +-------------+-------+----------+----------+-----------+--------+---------+ IVC/Iliac Findings: +--------+------+--------+--------+    IVC    Patent Thrombus Comments  +--------+------+--------+--------+  IVC Prox patent                    +--------+------+--------+--------+  Left Stent(s): +-------------------+--------+--------+--------+--------+  Common Iliac Artery PSV cm/s Stenosis Waveform Comments  +-------------------+--------+--------+--------+--------+  Prox to Stent       147               biphasic           +-------------------+--------+--------+--------+--------+  Proximal Stent      128               biphasic           +-------------------+--------+--------+--------+--------+  Mid Stent           145               biphasic           +-------------------+--------+--------+--------+--------+  Distal Stent        145               biphasic           +-------------------+--------+--------+--------+--------+  Distal to Stent     111               biphasic           +-------------------+--------+--------+--------+--------+ Patent left common iliac artery stent without restenosis.  Summary: Abdominal Aorta: No evidence of an abdominal aortic aneurysm was visualized. The largest aortic measurement is 1.9 cm. Stenosis: +------------------+--------------------+-----------------+--------------------+  Location           Stenosis             Stent             Comments              +------------------+--------------------+-----------------+--------------------+  Right Common Iliac Essentially stable                     Increase in                               >50% stenosis                          velocities compared                                                              to prior exam.         +------------------+--------------------+-----------------+--------------------+  Left Common Iliac                       Patent, no  stenosis                                +------------------+--------------------+-----------------+--------------------+  Right External     <50% stenosis, low                                            Iliac              end range                                                    +------------------+--------------------+-----------------+--------------------+  Left External      <50% stenosis, low                                            Iliac              end range                                                    +------------------+--------------------+-----------------+--------------------+ Patent IVC.  *See table(s) above for measurements and observations. Suggest follow up study in 6 months.  Electronically signed by Quay Burow MD on 11/06/2018 at 4:56:02 PM.    Final      Assessment & Plan:  Plan  I am having [name]. Rog start on levocetirizine and doxycycline. I am also having her maintain her sertraline, LORazepam, estradiol, sodium chloride, linaclotide, dicyclomine, clopidogrel, Evolocumab, aspirin, lisinopril, atorvastatin, ezetimibe, and esomeprazole. We will continue to administer tiZANidine.  Meds ordered this encounter  Medications   levocetirizine (XYZAL) 5 MG tablet    Sig: Take 1 tablet (5 mg total) by mouth every evening.    Dispense:  30 tablet    Refill:  5   doxycycline (VIBRA-TABS) 100 MG tablet    Sig: Take 1 tablet (100 mg total) by mouth 2 (two) times daily.    Dispense:  20 tablet    Refill:  0   tiZANidine (ZANAFLEX) tablet 4 mg    Problem List Items Addressed This Visit    None    Visit Diagnoses    Non-recurrent acute serous otitis media of right ear    -  Primary   Relevant Medications   levocetirizine (XYZAL) 5 MG tablet   doxycycline  (VIBRA-TABS) 100 MG tablet   Other infective acute otitis externa of right ear       Relevant Medications   doxycycline (VIBRA-TABS) 100 MG tablet   Costochondritis, acute       Relevant Medications   tiZANidine (ZANAFLEX) tablet 4 mg      Follow-up: Return if symptoms worsen or fail to improve.  [name], DO

## 2019-01-24 ENCOUNTER — Telehealth: Payer: Self-pay

## 2019-01-24 NOTE — Telephone Encounter (Signed)
lmtcb to set up appt for 7/1 with Dr. Gwenlyn Found

## 2019-01-27 ENCOUNTER — Telehealth: Payer: Self-pay

## 2019-01-27 NOTE — Telephone Encounter (Signed)
-----   Message from [name], [place] sent at 01/21/2019  4:17 PM EDT ----- Please call cardiology-- Dr Gwenlyn Found office Pt has been waiting for a phone call from them to set up an appointment I spoke to Dr Gwenlyn Found the last visit ----  with staff message and he said someone would call her and still no one has --- she needs f/u from her procedure

## 2019-01-27 NOTE — Telephone Encounter (Signed)
Tried calling dr. Kennon Holter office-I was caller number [phone], with seeing patients I am unable to wait in line to schedule and appointment. I called patient to tell her the number of his office and to schedule an appointment at her convenience but I got no answer with the phone ringing several times.

## 2019-02-14 ENCOUNTER — Telehealth: Payer: Self-pay | Admitting: Cardiology

## 2019-02-14 NOTE — Telephone Encounter (Signed)
LVM, reminding pt of her appt on 02-17-19 with Kerin Ransom.

## 2019-02-17 ENCOUNTER — Other Ambulatory Visit: Payer: Self-pay

## 2019-02-17 ENCOUNTER — Encounter: Payer: Self-pay | Admitting: Cardiology

## 2019-02-17 ENCOUNTER — Ambulatory Visit (INDEPENDENT_AMBULATORY_CARE_PROVIDER_SITE_OTHER): Payer: Medicare HMO | Admitting: Cardiology

## 2019-02-17 VITALS — BP 140/77 | Temp 97.9°F | Ht 61.5 in | Wt 114.4 lb

## 2019-02-17 DIAGNOSIS — Z8249 Family history of ischemic heart disease and other diseases of the circulatory system: Secondary | ICD-10-CM | POA: Insufficient documentation

## 2019-02-17 DIAGNOSIS — I10 Essential (primary) hypertension: Secondary | ICD-10-CM

## 2019-02-17 DIAGNOSIS — M79605 Pain in left leg: Secondary | ICD-10-CM

## 2019-02-17 DIAGNOSIS — F17200 Nicotine dependence, unspecified, uncomplicated: Secondary | ICD-10-CM | POA: Diagnosis not present

## 2019-02-17 DIAGNOSIS — I739 Peripheral vascular disease, unspecified: Secondary | ICD-10-CM | POA: Diagnosis not present

## 2019-02-17 DIAGNOSIS — E785 Hyperlipidemia, unspecified: Secondary | ICD-10-CM | POA: Diagnosis not present

## 2019-02-17 NOTE — Patient Instructions (Addendum)
Medication Instructions:  Your physician recommends that you continue on your current medications as directed. Please refer to the Current Medication list given to you today. If you need a refill on your cardiac medications before your next appointment, please call your pharmacy.   Lab work: None  If you have labs (blood work) drawn today and your tests are completely normal, you will receive your results only by: Marland Kitchen MyChart Message (if you have MyChart) OR . A paper copy in the mail If you have any lab test that is abnormal or we need to change your treatment, we will call you to review the results.  Testing/Procedures: Your physician has requested that you have a lower extremity arterial exercise duplex. During this test, exercise and ultrasound are used to evaluate arterial blood flow in the legs. Allow one hour for this exam. There are no restrictions or special instructions.  Your physician has requested that you have an ankle brachial index (ABI). During this test an ultrasound and blood pressure cuff are used to evaluate the arteries that supply the arms and legs with blood. Allow thirty minutes for this exam. There are no restrictions or special instructions.   Follow-Up: At [facility], you and your health needs are our priority.  As part of our continuing mission to provide you with exceptional heart care, we have created designated Provider Care Teams.  These Care Teams include your primary Cardiologist (physician) and Advanced Practice Providers (APPs -  Physician Assistants and Nurse Practitioners) who all work together to provide you with the care you need, when you need it. You will need a follow up appointment in 6 months.  Please call our office 2 months in advance to schedule this appointment.  You may see Quay Burow, MD or one of the following Advanced Practice Providers on your designated Care Team:   Kerin Ransom, PA-C Roby Lofts, [place] . Sande Rives, PA-C  IF  YOUR TEST RESULTS COME BACK ABNORMAL THEN WE WILL CONTACT YOU AND HAVE YOU FOLLOW UP SOONER.  Any Other Special Instructions Will Be Listed Below (If Applicable).

## 2019-02-17 NOTE — Assessment & Plan Note (Signed)
LDL was 11 January 2019 on Rep[place]

## 2019-02-17 NOTE — Assessment & Plan Note (Signed)
She is seen today with Lt hip pain- some features similar to her pre PTA symptoms of claudication

## 2019-02-17 NOTE — Assessment & Plan Note (Signed)
S/P LCIA PTA March 2020- dopplers OK April 2020

## 2019-02-17 NOTE — Progress Notes (Signed)
Cardiology Office Note:    Date:  02/17/2019   ID:  Leah [name], DOB 01/04/1946, MRN [id number]  PCP:  Carollee Herter, Alferd Apa, DO  Cardiologist:  Quay Burow, MD  Electrophysiologist:  None   Referring MD: Carollee Herter, Alferd Apa, *   Chief Complaint  Patient presents with  . other     Left leg painful and swollen at times.Medications reviewed verbally with the patient.  *  History of Present Illness:    Leah [name] is a 73 y.o. female with a hx of PVD, s/p LCIA PTA and stenting March 2020.  LEA dopplers in April 2020 showed this site was patent.  She presents now with c/o Lt hip pain and "sweilling" in her left leg.  Her symptoms seem to be worse with walking but she also notices it when sitting.  She denies night pain.  She continues to smoke 1/2 PPD.  Her LDL on Repatha in June 2020 was 13!.     Past Medical History:  Diagnosis Date  . Allergy   . Anemia   . Anxiety   . Barrett esophagus   . Bladder pain   . Chronic bronchitis ([place])    per pt last episode july 2016  . Complication of anesthesia    slow to wake  . COPD (chronic obstructive pulmonary disease) ([place])   . Depression   . Diverticulosis   . Frequency of urination   . GERD (gastroesophageal reflux disease)   . Hiatal hernia   . History of adenomatous polyp of colon    tubular adenoma  . History of chronic gastritis   . History of diverticulitis of colon   . History of esophageal dilatation    for stricture  . History of esophageal spasm   . Hyperlipidemia   . Hypertension   . IBS (irritable bowel syndrome)   . Interstitial cystitis   . PAD (peripheral artery disease) ([place])   . Productive cough    intermittant  . Smokers' cough ([place])   . Urgency of urination     Past Surgical History:  Procedure Laterality Date  . ABDOMINAL AORTOGRAM N/A 10/14/2018   Procedure: ABDOMINAL AORTOGRAM;  Surgeon: Lorretta Harp, MD;  Location: [place] [facility];  Service: Cardiovascular;  Laterality:  N/A;  . ABDOMINAL HYSTERECTOMY  1975  . ANTERIOR CERVICAL DECOMP/DISCECTOMY FUSION N/A 09/07/2014   Procedure: Exploration of Fusion and Removal of Anterior Cervical Hematoma;  Surgeon: Newman Pies, MD;  Location: [facility] NEURO ORS;  Service: Neurosurgery;  Laterality: N/A;  . CARPAL TUNNEL RELEASE Right 1993  . COLONOSCOPY  last one 04-30-2013  . CYSTO WITH HYDRODISTENSION N/A 06/08/2015   Procedure: CYSTOSCOPY/HYDRODISTENSION INSTILLATION OF MARCAINE AND PYRIDIUM;  Surgeon: Irine Seal, MD;  Location: [facility];  Service: Urology;  Laterality: N/A;  . CYSTO/  HOD/  BLADDER BX  1990's  . ESOPHAGOGASTRODUODENOSCOPY  last one 09-11-2012  . LAPAROSCOPIC APPENDECTOMY N/A 09/21/2013   Procedure: APPENDECTOMY LAPAROSCOPIC;  Surgeon: Rolm Bookbinder, MD;  Location: [facility];  Service: General;  Laterality: N/A;  . LAPAROSCOPIC CHOLECYSTECTOMY  2002  . LAPAROSCOPY SIGMOID COLECTOMY  10-05-2008   diverticulitis  . LOWER EXTREMITY ANGIOGRAPHY Left 10/14/2018  . LOWER EXTREMITY ANGIOGRAPHY Bilateral 10/14/2018   Procedure: Lower Extremity Angiography;  Surgeon: Lorretta Harp, MD;  Location: [place] [facility];  Service: Cardiovascular;  Laterality: Bilateral;  ILIACS  . MICROLARYNGOSCOPY  09-27-2006   w/ true vocal cord stripping  and bilateral bx's of lesion's (  benign)  . PERIPHERAL VASCULAR INTERVENTION  10/14/2018   Procedure: PERIPHERAL VASCULAR INTERVENTION;  Surgeon: Lorretta Harp, MD;  Location: [place] [facility];  Service: Cardiovascular;;  . SINUS SURGERY WITH INSTATRAK  1991  . [place]  . TONSILLECTOMY AND ADENOIDECTOMY  1964    Current Medications: Current Meds  Medication Sig  . aspirin EC 81 MG EC tablet Take 1 tablet (81 mg total) by mouth daily.  Marland Kitchen atorvastatin (LIPITOR) 80 MG tablet Take 1 tablet (80 mg total) by mouth daily.  . clopidogrel (PLAVIX) 75 MG tablet Take 1 tablet (75 mg total) by mouth daily. (Patient taking  differently: Take 75 mg by mouth every evening. )  . dicyclomine (BENTYL) 10 MG capsule TAKE 1 CAPSULE BY MOUTH AS NEEDED FOR UP TO 1 DOSE FOR SPASMS. (Patient taking differently: Take 10 mg by mouth 2 (two) times daily as needed for spasms. )  . doxycycline (VIBRA-TABS) 100 MG tablet Take 1 tablet (100 mg total) by mouth 2 (two) times daily.  Marland Kitchen esomeprazole (NEXIUM) 40 MG capsule Take 1 capsule (40 mg total) by mouth 2 (two) times daily before a meal.  . estradiol (CLIMARA - DOSED IN MG/24 HR) 0.1 mg/24hr patch Place 0.1 mg onto the skin every Monday.  . Evolocumab (REPATHA SURECLICK) 009 MG/ML SOAJ Inject 1 Dose into the skin every 14 (fourteen) days. (Patient taking differently: Inject 140 mg into the skin every 14 (fourteen) days. )  . ezetimibe (ZETIA) 10 MG tablet Take 1 tablet (10 mg total) by mouth daily.  Marland Kitchen levocetirizine (XYZAL) 5 MG tablet Take 1 tablet (5 mg total) by mouth every evening.  Marland Kitchen lisinopril (ZESTRIL) 20 MG tablet Take 1 tablet (20 mg total) by mouth daily. Needs ov  . LORazepam (ATIVAN) 0.5 MG tablet Take 0.5 mg by mouth at bedtime.   . sertraline (ZOLOFT) 50 MG tablet Take 50 mg by mouth at bedtime.  . sodium chloride (OCEAN) 0.65 % SOLN nasal spray Place 1 spray into both nostrils daily as needed for congestion.   Current Facility-Administered Medications for the 02/17/19 encounter (Office Visit) with Erlene Quan, PA-C  Medication  . tiZANidine (ZANAFLEX) tablet 4 mg     Allergies:   Amoxicillin-pot clavulanate, Avelox [moxifloxacin hcl in nacl], Ciprofloxacin, Keflex [cephalexin], Penicillins, Sulfa antibiotics, Prednisone, Acyclovir and related, Clindamycin/lincomycin, Levaquin [levofloxacin], and Zyvox [linezolid]   Social History   Socioeconomic History  . Marital status: Married    Spouse name: Not on file  . Number of children: Not on file  . Years of education: Not on file  . Highest education level: Not on file  Occupational History  . Not on file   Social Needs  . Financial resource strain: Not on file  . Food insecurity    Worry: Not on file    Inability: Not on file  . Transportation needs    Medical: Not on file    Non-medical: Not on file  Tobacco Use  . Smoking status: Current Every Day Smoker    Packs/day: 1.00    Years: 47.00    Pack years: 47.00    Types: Cigarettes  . Smokeless tobacco: Never Used  . Tobacco comment: counseled about smoking cessation  Substance and Sexual Activity  . Alcohol use: No    Alcohol/week: 0.0 standard drinks  . Drug use: No  . Sexual activity: Not Currently    Birth control/protection: Post-menopausal  Lifestyle  . Physical activity    Days  per week: Not on file    Minutes per session: Not on file  . Stress: Not on file  Relationships  . Social Herbalist on phone: Not on file    Gets together: Not on file    Attends religious service: Not on file    Active member of club or organization: Not on file    Attends meetings of clubs or organizations: Not on file    Relationship status: Not on file  Other Topics Concern  . Not on file  Social History Narrative  . Not on file     Family History: The patient's family history includes Colon polyps in her mother; Diabetes in her father; Heart disease in her father; Hypertension in her father; Leukemia in her father; Pancreatic cancer in her father and mother; Prostate cancer in her father. There is no history of Colon cancer, Esophageal cancer, Rectal cancer, or Stomach cancer.  ROS:   Please see the history of present illness.     All other systems reviewed and are negative.  EKGs/Labs/Other Studies Reviewed:    The following studies were reviewed today: LEA dopplers April 2020  EKG:  EKG is ordered today.  The ekg ordered today demonstrates NSR- HR 73  Recent Labs: 10/15/2018: Hemoglobin 10.9; Platelets 205 01/07/2019: ALT 8; BUN 9; Creatinine, Ser 0.86; Potassium 4.1; Sodium 141  Recent Lipid Panel    Component  Value Date/Time   CHOL 65 01/07/2019 1009   TRIG 90.0 01/07/2019 1009   HDL 33.80 (L) 01/07/2019 1009   CHOLHDL 2 01/07/2019 1009   VLDL 18.0 01/07/2019 1009   LDLCALC 13 01/07/2019 1009   LDLDIRECT 151.0 11/07/2016 1647    Physical Exam:    VS:  Temp 97.9 F (36.6 C) (Temporal)   Ht 5' 1.5" (1.562 m)   Wt 114 lb 6.4 oz (51.9 kg)   SpO2 93%   BMI 21.27 kg/m     Wt Readings from Last 3 Encounters:  02/17/19 114 lb 6.4 oz (51.9 kg)  01/21/19 116 lb (52.6 kg)  10/15/18 120 lb 5.9 oz (54.6 kg)     GEN:  Well nourished, well developed, looks plder than her age, in no acute distress HEENT: Normal NECK: No JVD; No carotid bruits LYMPHATICS: No lymphadenopathy CARDIAC: RRR, no murmurs, rubs, gallops RESPIRATORY: decreased breath sounds overall and faint expiratory wheezing ABDOMEN: Soft, non-tender, non-distended MUSCULOSKELETAL:  No edema; distal pulses diminished bilaterally. She has a 3+/4 Lt FA pulse, 1-2+/4 RFA pulse.No deformity  SKIN: Warm and dry NEUROLOGIC:  Alert and oriented x 3 PSYCHIATRIC:  Normal affect   ASSESSMENT:    Leg pain, left She is seen today with Lt hip pain- some features similar to her pre PTA symptoms of claudication  PAD (peripheral artery disease) ([place]) S/P LCIA PTA March 2020- dopplers OK April 2020  Essential hypertension Controlled  Dyslipidemia, goal LDL below 70 LDL was 11 January 2019 on Repatha   Smoker She continues to smoke 1/2 PPD, COPD on exam  PLAN:    Check LLE LEA doppler- if normal refer to PCP for hip pain.  F/U 6 months with Dr Gwenlyn Found.  I encouraged her to stop smoking.   Medication Adjustments/Labs and Tests Ordered: Current medicines are reviewed at length with the patient today.  Concerns regarding medicines are outlined above.  Orders Placed This Encounter  Procedures  . EKG 12-Lead   No orders of the defined types were placed in this encounter.   Patient  Instructions  Medication Instructions:  Your  physician recommends that you continue on your current medications as directed. Please refer to the Current Medication list given to you today. If you need a refill on your cardiac medications before your next appointment, please call your pharmacy.   Lab work: None  If you have labs (blood work) drawn today and your tests are completely normal, you will receive your results only by: Marland Kitchen MyChart Message (if you have MyChart) OR . A paper copy in the mail If you have any lab test that is abnormal or we need to change your treatment, we will call you to review the results.  Testing/Procedures: Your physician has requested that you have a lower extremity arterial exercise duplex. During this test, exercise and ultrasound are used to evaluate arterial blood flow in the legs. Allow one hour for this exam. There are no restrictions or special instructions.  Your physician has requested that you have an ankle brachial index (ABI). During this test an ultrasound and blood pressure cuff are used to evaluate the arteries that supply the arms and legs with blood. Allow thirty minutes for this exam. There are no restrictions or special instructions.   Follow-Up: At [facility], you and your health needs are our priority.  As part of our continuing mission to provide you with exceptional heart care, we have created designated Provider Care Teams.  These Care Teams include your primary Cardiologist (physician) and Advanced Practice Providers (APPs -  Physician Assistants and Nurse Practitioners) who all work together to provide you with the care you need, when you need it. You will need a follow up appointment in 6 months.  Please call our office 2 months in advance to schedule this appointment.  You may see Quay Burow, MD or one of the following Advanced Practice Providers on your designated Care Team:   Kerin Ransom, PA-C Roby Lofts, [place] . Sande Rives, PA-C  IF YOUR TEST RESULTS COME BACK  ABNORMAL THEN WE WILL CONTACT YOU AND HAVE YOU FOLLOW UP SOONER.  Any Other Special Instructions Will Be Listed Below (If Applicable).      Signed, Kerin Ransom, PA-C  02/17/2019 8:46 AM    [facility]

## 2019-02-17 NOTE — Assessment & Plan Note (Signed)
Controlled.  

## 2019-02-17 NOTE — Assessment & Plan Note (Signed)
She continues to smoke 1/2 PPD, COPD on exam

## 2019-02-19 ENCOUNTER — Other Ambulatory Visit: Payer: Self-pay | Admitting: Family Medicine

## 2019-02-19 DIAGNOSIS — I10 Essential (primary) hypertension: Secondary | ICD-10-CM

## 2019-02-21 NOTE — Telephone Encounter (Signed)
appt 7/20 with Kerin Ransom

## 2019-02-23 ENCOUNTER — Other Ambulatory Visit: Payer: Self-pay | Admitting: Family Medicine

## 2019-02-23 DIAGNOSIS — E785 Hyperlipidemia, unspecified: Secondary | ICD-10-CM

## 2019-03-03 ENCOUNTER — Telehealth: Payer: Self-pay | Admitting: Internal Medicine

## 2019-03-03 DIAGNOSIS — F419 Anxiety disorder, unspecified: Secondary | ICD-10-CM | POA: Diagnosis not present

## 2019-03-03 NOTE — Telephone Encounter (Signed)
PA for Repatha [employer] was requested by [email or web address] renewal.   Upon attempting, received notice:  Jaidence Geisler Key: [id number] Additional Information Required Authorization already on file for this request.  Most recent LDL from PCP = 13mg /dL

## 2019-03-04 ENCOUNTER — Encounter (HOSPITAL_COMMUNITY): Payer: Medicare HMO

## 2019-03-04 ENCOUNTER — Ambulatory Visit (HOSPITAL_COMMUNITY)
Admission: RE | Admit: 2019-03-04 | Discharge: 2019-03-04 | Disposition: A | Discharge status: Home / Self Care | Payer: Medicare HMO | Source: Ambulatory Visit | Attending: Cardiovascular Disease | Admitting: Cardiovascular Disease

## 2019-03-04 ENCOUNTER — Other Ambulatory Visit: Payer: Self-pay

## 2019-03-04 DIAGNOSIS — I10 Essential (primary) hypertension: Secondary | ICD-10-CM | POA: Diagnosis not present

## 2019-03-04 DIAGNOSIS — M79605 Pain in left leg: Secondary | ICD-10-CM | POA: Diagnosis not present

## 2019-03-04 DIAGNOSIS — F17200 Nicotine dependence, unspecified, uncomplicated: Secondary | ICD-10-CM

## 2019-03-04 DIAGNOSIS — E785 Hyperlipidemia, unspecified: Secondary | ICD-10-CM | POA: Diagnosis not present

## 2019-03-04 DIAGNOSIS — I739 Peripheral vascular disease, unspecified: Secondary | ICD-10-CM | POA: Diagnosis not present

## 2019-03-04 DIAGNOSIS — Z8249 Family history of ischemic heart disease and other diseases of the circulatory system: Secondary | ICD-10-CM | POA: Diagnosis not present

## 2019-03-10 ENCOUNTER — Other Ambulatory Visit: Payer: Self-pay | Admitting: *Deleted

## 2019-03-10 DIAGNOSIS — I739 Peripheral vascular disease, unspecified: Secondary | ICD-10-CM

## 2019-04-01 ENCOUNTER — Telehealth: Payer: Self-pay | Admitting: Family Medicine

## 2019-04-01 DIAGNOSIS — K219 Gastro-esophageal reflux disease without esophagitis: Secondary | ICD-10-CM

## 2019-04-02 ENCOUNTER — Other Ambulatory Visit: Payer: Self-pay | Admitting: *Deleted

## 2019-04-02 DIAGNOSIS — R6889 Other general symptoms and signs: Secondary | ICD-10-CM | POA: Diagnosis not present

## 2019-04-02 DIAGNOSIS — Z20822 Contact with and (suspected) exposure to covid-19: Secondary | ICD-10-CM

## 2019-04-02 NOTE — Telephone Encounter (Signed)
Dr Carollee Herter-- please see below potential drug / drug interaction and advise refill request.  Use of omeprazole or esomeprazole may lead to reduced ability of Clopidogrel to inhibit platelet aggregation and increase the risk of subsequent cardiovascular events. Coadministration of Es(omeprazole) and Clopidogrel should be avoided according to the official package labeling of Clopidogrel. However, according to an expert consensus document, the benefits of omeprazole or esomeprazole may outweigh the risk of the potential reduction in cardiovascular efficacy in patients receiving Clopidogrel who are at high risk for gastrointestinal bleeding.

## 2019-04-03 LAB — NOVEL CORONAVIRUS, NAA: SARS-CoV-2, NAA: NOT DETECTED

## 2019-04-03 NOTE — Telephone Encounter (Signed)
Left message on machine to call back  

## 2019-04-03 NOTE — Telephone Encounter (Signed)
Let pt know we need to try to change it to pepcid 20 mg bid #60   If this doe not work we may need to refer to GI

## 2019-04-04 ENCOUNTER — Telehealth: Payer: Self-pay | Admitting: Gastroenterology

## 2019-04-04 MED ORDER — FAMOTIDINE 20 MG PO TABS: 20.0000 mg | ORAL_TABLET | Freq: Two times a day (BID) | ORAL | 0 refills | Status: DC

## 2019-04-04 NOTE — Telephone Encounter (Signed)
The pt is calling with a 2 year history of worsening constipation.  She has been taking sennokot without relief.  She is passing gas and small amounts of hard stool and liquid.  She was advised to try 3 doses of miralax in 8 oz of water, etc. She will call back on Monday with an update.  If no response and she develops severe abd pain she is to go to the ED for eval.  She was scheduled to see Dr Loletha Carrow on 10/6.

## 2019-04-04 NOTE — Telephone Encounter (Signed)
This patient was last seen in the office by Alonza Bogus PA-C. Thanks.

## 2019-04-04 NOTE — Telephone Encounter (Signed)
Pt aware of the change and the reason for the change. Pt verbalized understanding.  Pt is willing to try Pepcid.  Although she did say dhe took many yrs ago. However, pt needs to get her meds through [facility]. Duetot insurance requirements and pricing.  So pt is requesting a 90 day of the pepcid BID sent to   [place], [place] [phone] (Phone) [phone] (Fax)

## 2019-04-04 NOTE — Telephone Encounter (Signed)
Medication sent in. 

## 2019-04-04 NOTE — Addendum Note (Signed)
Addended by: Kem Boroughs D on: 04/04/2019 01:45 PM   Modules accepted: Orders

## 2019-04-08 NOTE — Telephone Encounter (Signed)
The pt advised that she had a very good bowel movement today and was advised to continue 2 doses of Miralax daily until appt with Dr Loletha Carrow.  The pt has been advised of the information and verbalized understanding.

## 2019-04-08 NOTE — Telephone Encounter (Signed)
Pt states she is calling back with an update after taking Miralax best call back # [phone].

## 2019-04-09 ENCOUNTER — Inpatient Hospital Stay: Admission: RE | Admit: 2019-04-09 | Payer: Medicare HMO | Source: Ambulatory Visit

## 2019-04-11 ENCOUNTER — Other Ambulatory Visit: Payer: Self-pay

## 2019-04-11 MED ORDER — DICYCLOMINE HCL 10 MG PO CAPS: 10.0000 mg | ORAL_CAPSULE | Freq: Two times a day (BID) | ORAL | 2 refills | Status: DC | PRN

## 2019-04-17 ENCOUNTER — Other Ambulatory Visit: Payer: Self-pay | Admitting: Internal Medicine

## 2019-04-17 ENCOUNTER — Other Ambulatory Visit: Payer: Self-pay | Admitting: *Deleted

## 2019-04-17 DIAGNOSIS — I10 Essential (primary) hypertension: Secondary | ICD-10-CM

## 2019-04-17 DIAGNOSIS — E785 Hyperlipidemia, unspecified: Secondary | ICD-10-CM

## 2019-04-17 MED ORDER — LISINOPRIL 20 MG PO TABS: 20.0000 mg | ORAL_TABLET | Freq: Every day | ORAL | 1 refills | Status: DC

## 2019-04-17 MED ORDER — EZETIMIBE 10 MG PO TABS: 10.0000 mg | ORAL_TABLET | Freq: Every day | ORAL | 1 refills | Status: DC

## 2019-04-18 ENCOUNTER — Other Ambulatory Visit: Payer: Self-pay | Admitting: Family Medicine

## 2019-04-18 DIAGNOSIS — I10 Essential (primary) hypertension: Secondary | ICD-10-CM

## 2019-04-19 ENCOUNTER — Encounter (HOSPITAL_BASED_OUTPATIENT_CLINIC_OR_DEPARTMENT_OTHER): Payer: Self-pay | Admitting: Emergency Medicine

## 2019-04-19 ENCOUNTER — Emergency Department (HOSPITAL_BASED_OUTPATIENT_CLINIC_OR_DEPARTMENT_OTHER)
Admission: EM | Admit: 2019-04-19 | Discharge: 2019-04-19 | Disposition: A | Discharge status: Home / Self Care | Payer: Medicare HMO | Attending: Emergency Medicine | Admitting: Emergency Medicine

## 2019-04-19 ENCOUNTER — Other Ambulatory Visit: Payer: Self-pay

## 2019-04-19 ENCOUNTER — Emergency Department (HOSPITAL_BASED_OUTPATIENT_CLINIC_OR_DEPARTMENT_OTHER): Payer: Medicare HMO

## 2019-04-19 DIAGNOSIS — F17210 Nicotine dependence, cigarettes, uncomplicated: Secondary | ICD-10-CM | POA: Diagnosis not present

## 2019-04-19 DIAGNOSIS — S46912A Strain of unspecified muscle, fascia and tendon at shoulder and upper arm level, left arm, initial encounter: Secondary | ICD-10-CM | POA: Diagnosis not present

## 2019-04-19 DIAGNOSIS — Y929 Unspecified place or not applicable: Secondary | ICD-10-CM | POA: Diagnosis not present

## 2019-04-19 DIAGNOSIS — Z7982 Long term (current) use of aspirin: Secondary | ICD-10-CM | POA: Insufficient documentation

## 2019-04-19 DIAGNOSIS — Z880 Allergy status to penicillin: Secondary | ICD-10-CM | POA: Diagnosis not present

## 2019-04-19 DIAGNOSIS — I10 Essential (primary) hypertension: Secondary | ICD-10-CM | POA: Diagnosis not present

## 2019-04-19 DIAGNOSIS — X58XXXA Exposure to other specified factors, initial encounter: Secondary | ICD-10-CM | POA: Insufficient documentation

## 2019-04-19 DIAGNOSIS — Y999 Unspecified external cause status: Secondary | ICD-10-CM | POA: Insufficient documentation

## 2019-04-19 DIAGNOSIS — E785 Hyperlipidemia, unspecified: Secondary | ICD-10-CM | POA: Diagnosis not present

## 2019-04-19 DIAGNOSIS — M62838 Other muscle spasm: Secondary | ICD-10-CM | POA: Diagnosis not present

## 2019-04-19 DIAGNOSIS — Z79899 Other long term (current) drug therapy: Secondary | ICD-10-CM | POA: Diagnosis not present

## 2019-04-19 DIAGNOSIS — J449 Chronic obstructive pulmonary disease, unspecified: Secondary | ICD-10-CM | POA: Insufficient documentation

## 2019-04-19 DIAGNOSIS — S4992XA Unspecified injury of left shoulder and upper arm, initial encounter: Secondary | ICD-10-CM | POA: Diagnosis present

## 2019-04-19 DIAGNOSIS — Y939 Activity, unspecified: Secondary | ICD-10-CM | POA: Insufficient documentation

## 2019-04-19 DIAGNOSIS — M19012 Primary osteoarthritis, left shoulder: Secondary | ICD-10-CM | POA: Diagnosis not present

## 2019-04-19 LAB — BASIC METABOLIC PANEL
Anion gap: 11 (ref 5–15)
BUN: 9 mg/dL (ref 8–23)
CO2: 25 mmol/L (ref 22–32)
Calcium: 9 mg/dL (ref 8.9–10.3)
Chloride: 101 mmol/L (ref 98–111)
Creatinine, Ser: 0.78 mg/dL (ref 0.44–1.00)
GFR calc Af Amer: >60 mL/min (ref >60)
GFR calc non Af Amer: >60 mL/min (ref >60)
Glucose, Bld: 110 mg/dL — ABNORMAL HIGH (ref 70–99)
Potassium: 4.3 mmol/L (ref 3.5–5.1)
Sodium: 137 mmol/L (ref 135–145)

## 2019-04-19 LAB — CBC WITH DIFFERENTIAL/PLATELET
Abs Immature Granulocytes: 0.01 10*3/uL (ref 0.00–0.07)
Basophils Absolute: 0.1 10*3/uL (ref 0.0–0.1)
Basophils Relative: 1 %
Eosinophils Absolute: 0.3 10*3/uL (ref 0.0–0.5)
Eosinophils Relative: 4 %
HCT: 43.1 % (ref 36.0–46.0)
Hemoglobin: 13.5 g/dL (ref 12.0–15.0)
Immature Granulocytes: 0 %
Lymphocytes Relative: 22 %
Lymphs Abs: 1.5 10*3/uL (ref 0.7–4.0)
MCH: 28.9 pg (ref 26.0–34.0)
MCHC: 31.3 g/dL (ref 30.0–36.0)
MCV: 92.3 fL (ref 80.0–100.0)
Monocytes Absolute: 0.4 10*3/uL (ref 0.1–1.0)
Monocytes Relative: 7 %
Neutro Abs: 4.2 10*3/uL (ref 1.7–7.7)
Neutrophils Relative %: 66 %
Platelets: 245 10*3/uL (ref 150–400)
RBC: 4.67 MIL/uL (ref 3.87–5.11)
RDW: 15.2 % (ref 11.5–15.5)
WBC: 6.5 10*3/uL (ref 4.0–10.5)
nRBC: 0 % (ref 0.0–0.2)

## 2019-04-19 LAB — TROPONIN I (HIGH SENSITIVITY): Troponin I (High Sensitivity): 2 ng/L (ref <18)

## 2019-04-19 MED ORDER — METHOCARBAMOL 500 MG PO TABS: 500.0000 mg | ORAL_TABLET | Freq: Four times a day (QID) | ORAL | 0 refills | Status: DC | PRN

## 2019-04-19 MED ORDER — OXYCODONE-ACETAMINOPHEN 5-325 MG PO TABS
1.0000 | ORAL_TABLET | Freq: Once | ORAL | Status: AC
  Administered 2019-04-19: 1 via ORAL
  Filled 2019-04-19: qty 1

## 2019-04-19 MED ORDER — IBUPROFEN 400 MG PO TABS: 600.0000 mg | ORAL_TABLET | Freq: Once | ORAL | Status: DC

## 2019-04-19 MED ORDER — DIAZEPAM 2 MG PO TABS
2.0000 mg | ORAL_TABLET | Freq: Once | ORAL | Status: AC
  Administered 2019-04-19: 2 mg via ORAL
  Filled 2019-04-19: qty 1

## 2019-04-19 NOTE — ED Provider Notes (Signed)
[facility] EMERGENCY DEPARTMENT Provider Note   CSN: [id number] Arrival date & time: 04/19/19  1140     History   Chief Complaint Chief Complaint  Patient presents with   Back Pain    HPI Leah [name] is a 73 y.o. female.  I presents the ER with left shoulder, arm pain.  Symptoms for the last few days.  Patient states pain starts in her left shoulder, radiates down left arm, spasm, sharp, stabbing pain.  Mild to moderate in severity, currently moderate.  No associated chest pain or difficulty breathing.  No fevers, swelling noted.  Denies any recent injuries, states a few days ago did have some to do some heavy lifting.  No numbness, weakness.     HPI  Past Medical History:  Diagnosis Date   Allergy    Anemia    Anxiety    Barrett esophagus    Bladder pain    Chronic bronchitis ([place])    per pt last episode july 2016   Complication of anesthesia    slow to wake   COPD (chronic obstructive pulmonary disease) (HCC)    Depression    Diverticulosis    Frequency of urination    GERD (gastroesophageal reflux disease)    Hiatal hernia    History of adenomatous polyp of colon    tubular adenoma   History of chronic gastritis    History of diverticulitis of colon    History of esophageal dilatation    for stricture   History of esophageal spasm    Hyperlipidemia    Hypertension    IBS (irritable bowel syndrome)    Interstitial cystitis    PAD (peripheral artery disease) (HCC)    Productive cough    intermittant   Smokers' cough ([place])    Urgency of urination     Patient Active Problem List   Diagnosis Date Noted   Family history of coronary artery disease 02/17/2019   Claudication in peripheral vascular disease ([place]) 10/14/2018   Dyspnea 09/03/2018   Calcification of native coronary artery 09/03/2018   PAD (peripheral artery disease) ([place]) 09/03/2018   History of esophageal stricture 06/19/2018   Small bowel  obstruction due to adhesions ([place]) 04/08/2016   Frozen shoulder 04/27/2015   Hematoma 09/07/2014   Neck pain 05/19/2014   Fatigue 05/08/2014   Back pain 05/08/2014   Pain in joint, upper arm 05/08/2014   LLQ abdominal pain 03/02/2014   History of diverticulitis 03/02/2014   COPD exacerbation ([place]) 01/28/2014   Oral thrush 01/28/2014   Influenza 10/27/2013   Appendicitis 09/19/2013   Abdominal pain, left lower quadrant 03/05/2013   Essential hypertension 02/07/2013   Sinusitis, acute 01/15/2013   COPD GOLD II 01/15/2013   Smoker 12/20/2012   Epigastric abdominal pain 09/03/2012   Left lower quadrant pain 01/24/2011   Leg pain, left 06/07/2009   ROSACEA 04/16/2009   B12 DEFICIENCY 04/09/2009   ANEMIA, IRON DEFICIENCY 03/19/2009   MALAISE AND FATIGUE 03/19/2009   NECK PAIN, LEFT 01/11/2009   URI 07/30/2008   Constipation 05/26/2008   NAUSEA AND VOMITING 05/26/2008   Claudication ([place]) 04/27/2008   VOMITING 04/17/2008   Dysphagia 04/17/2008   ANXIETY DEPRESSION 03/12/2008   TREMOR 03/12/2008   Dyslipidemia, goal LDL below 70 11/13/2007   DEPRESSION 11/13/2007   ESOPHAGEAL SPASM 11/13/2007   GERD 11/13/2007   IBS 11/13/2007   CARPAL TUNNEL SYNDROME, HX OF 11/13/2007   Diverticulosis of colon (without mention of hemorrhage) 03/14/2007   GASTRITIS,  CHRONIC 04/10/2002   HIATAL HERNIA 04/10/2002    Past Surgical History:  Procedure Laterality Date   ABDOMINAL AORTOGRAM N/A 10/14/2018   Procedure: ABDOMINAL AORTOGRAM;  Surgeon: Lorretta Harp, MD;  Location: [facility];  Service: Cardiovascular;  Laterality: N/A;   ABDOMINAL HYSTERECTOMY  1975   ANTERIOR CERVICAL DECOMP/DISCECTOMY FUSION N/A 09/07/2014   Procedure: Exploration of Fusion and Removal of Anterior Cervical Hematoma;  Surgeon: Newman Pies, MD;  Location: [facility];  Service: Neurosurgery;  Laterality: N/A;   CARPAL TUNNEL RELEASE Right 1993    COLONOSCOPY  last one 04-30-2013   CYSTO WITH HYDRODISTENSION N/A 06/08/2015   Procedure: CYSTOSCOPY/HYDRODISTENSION INSTILLATION OF MARCAINE AND PYRIDIUM;  Surgeon: Irine Seal, MD;  Location: [facility];  Service: Urology;  Laterality: N/A;   CYSTO/  HOD/  BLADDER BX  1990's   ESOPHAGOGASTRODUODENOSCOPY  last one 09-11-2012   LAPAROSCOPIC APPENDECTOMY N/A 09/21/2013   Procedure: APPENDECTOMY LAPAROSCOPIC;  Surgeon: Rolm Bookbinder, MD;  Location: [facility];  Service: General;  Laterality: N/A;   LAPAROSCOPIC CHOLECYSTECTOMY  2002   LAPAROSCOPY SIGMOID COLECTOMY  10-05-2008   diverticulitis   LOWER EXTREMITY ANGIOGRAPHY Left 10/14/2018   LOWER EXTREMITY ANGIOGRAPHY Bilateral 10/14/2018   Procedure: Lower Extremity Angiography;  Surgeon: Lorretta Harp, MD;  Location: [facility];  Service: Cardiovascular;  Laterality: Bilateral;  ILIACS   MICROLARYNGOSCOPY  09-27-2006   w/ true vocal cord stripping  and bilateral bx's of lesion's (benign)   PERIPHERAL VASCULAR INTERVENTION  10/14/2018   Procedure: PERIPHERAL VASCULAR INTERVENTION;  Surgeon: Lorretta Harp, MD;  Location: [facility];  Service: Cardiovascular;;   SINUS SURGERY WITH [place]   TONSILLECTOMY AND ADENOIDECTOMY  1964     OB History   No obstetric history on file.      Home Medications    Prior to Admission medications   Medication Sig Start Date End Date Taking? Authorizing Provider  aspirin EC 81 MG EC tablet Take 1 tablet (81 mg total) by mouth daily. 10/15/18   Cheryln Manly, NP  atorvastatin (LIPITOR) 80 MG tablet Take 1 tablet (80 mg total) by mouth daily. 01/01/19   Roma Schanz R, DO  clopidogrel (PLAVIX) 75 MG tablet TAKE 1 TABLET BY MOUTH EVERY DAY 04/18/19   Hilty, Nadean Corwin, MD  dicyclomine (BENTYL) 10 MG capsule Take 1 capsule (10 mg total) by mouth 2 (two) times daily as needed for spasms. 04/11/19   Doran Stabler, MD  doxycycline (VIBRA-TABS) 100 MG tablet Take 1 tablet (100 mg total) by mouth 2 (two) times daily. 01/21/19   [name], DO  esomeprazole (NEXIUM) 40 MG capsule Take 1 capsule (40 mg total) by mouth 2 (two) times daily before a meal. 01/06/19   Carollee Herter, Alferd Apa, DO  estradiol (CLIMARA - DOSED IN MG/24 HR) 0.1 mg/24hr patch Place 0.1 mg onto the skin every Monday. 02/08/16   [provider]  Evolocumab (REPATHA SURECLICK) XX123456 MG/ML SOAJ Inject 1 Dose into the skin every 14 (fourteen) days. Patient taking differently: Inject 140 mg into the skin every 14 (fourteen) days.  09/03/18   Hilty, Nadean Corwin, MD  ezetimibe (ZETIA) 10 MG tablet Take 1 tablet (10 mg total) by mouth daily. 04/17/19   [name], DO  famotidine (PEPCID) 20 MG tablet Take 1 tablet (20 mg total) by mouth 2 (two) times daily. 04/04/19  Carollee Herter, Yvonne R, DO  levocetirizine (XYZAL) 5 MG tablet Take 1 tablet (5 mg total) by mouth every evening. 01/21/19   Carollee Herter, Alferd Apa, DO  linaclotide (LINZESS) 145 MCG CAPS capsule Take 1 capsule (145 mcg total) by mouth daily. Patient not taking: Reported on 02/17/2019 05/31/18   Nelida Meuse III, MD  lisinopril (ZESTRIL) 20 MG tablet Take 1 tablet (20 mg total) by mouth daily. 04/17/19   Roma Schanz R, DO  LORazepam (ATIVAN) 0.5 MG tablet Take 0.5 mg by mouth at bedtime.  12/04/14   [provider]  methocarbamol (ROBAXIN) 500 MG tablet Take 1 tablet (500 mg total) by mouth every 6 (six) hours as needed for muscle spasms. 04/19/19   Lucrezia Starch, MD  sertraline (ZOLOFT) 50 MG tablet Take 50 mg by mouth at bedtime.    [provider]  sodium chloride (OCEAN) 0.65 % SOLN nasal spray Place 1 spray into both nostrils daily as needed for congestion.    [provider]    Family History Family History  Problem Relation Age of Onset   Pancreatic cancer Mother    Colon polyps Mother    Pancreatic cancer  Father    Diabetes Father    Heart disease Father    Leukemia Father    Prostate cancer Father    Hypertension Father    Colon cancer Neg Hx    Esophageal cancer Neg Hx    Rectal cancer Neg Hx    Stomach cancer Neg Hx     Social History Social History   Tobacco Use   Smoking status: Current Every Day Smoker    Packs/day: 1.00    Years: 47.00    Pack years: 47.00    Types: Cigarettes   Smokeless tobacco: Never Used   Tobacco comment: counseled about smoking cessation  Substance Use Topics   Alcohol use: No    Alcohol/week: 0.0 standard drinks   Drug use: No     Allergies   Amoxicillin-pot clavulanate, Avelox [moxifloxacin hcl in nacl], Ciprofloxacin, Keflex [cephalexin], Penicillins, Sulfa antibiotics, Prednisone, Acyclovir and related, Clindamycin/lincomycin, Levaquin [levofloxacin], and Zyvox [linezolid]   Review of Systems Review of Systems  Constitutional: Negative for chills and fever.  HENT: Negative for ear pain and sore throat.   Eyes: Negative for pain and visual disturbance.  Respiratory: Negative for cough and shortness of breath.   Cardiovascular: Negative for chest pain and palpitations.  Gastrointestinal: Negative for abdominal pain and vomiting.  Genitourinary: Negative for dysuria and hematuria.  Musculoskeletal: Positive for arthralgias. Negative for back pain.  Skin: Negative for color change and rash.  Neurological: Negative for seizures and syncope.  All other systems reviewed and are negative.    Physical Exam Updated Vital Signs BP (!) 176/87 (BP Location: Right Arm)    Pulse 70    Temp (!) 97.5 F (36.4 C) (Oral)    Resp 16    Ht 5\' 1"  (1.549 m)    Wt 51.3 kg    SpO2 100%    BMI 21.35 kg/m   Physical Exam Vitals signs and nursing note reviewed.  Constitutional:      General: She is not in acute distress.    Appearance: She is well-developed.  HENT:     Head: Normocephalic and atraumatic.  Eyes:     Conjunctiva/sclera:  Conjunctivae normal.  Neck:     Musculoskeletal: Neck supple.  Cardiovascular:     Rate and Rhythm: Normal rate and regular  rhythm.     Heart sounds: No murmur.  Pulmonary:     Effort: Pulmonary effort is normal. No respiratory distress.     Breath sounds: Normal breath sounds.  Abdominal:     Palpations: Abdomen is soft.     Tenderness: There is no abdominal tenderness.  Musculoskeletal:     Comments: Mild tenderness palpation over left shoulder, no tenderness over C, T, L-spine, no tenderness throughout remainder of left upper extremity, no obvious deformity, distal sensation, distal pulses intact, strength intact  Skin:    General: Skin is warm and dry.     Capillary Refill: Capillary refill takes less than 2 seconds.  Neurological:     General: No focal deficit present.     Mental Status: She is alert and oriented to person, place, and time.      ED Treatments / Results  Labs (all labs ordered are listed, but only abnormal results are displayed) Labs Reviewed  BASIC METABOLIC PANEL - Abnormal; Notable for the following components:      Result Value   Glucose, Bld 110 (*)    All other components within normal limits  CBC WITH DIFFERENTIAL/PLATELET  TROPONIN I (HIGH SENSITIVITY)    EKG EKG Interpretation  Date/Time:  Saturday April 19 2019 12:04:49 EDT Ventricular Rate:  64 PR Interval:    QRS Duration: 84 QT Interval:  414 QTC Calculation: 428 R Axis:   65 Text Interpretation:  Sinus rhythm Confirmed by Madalyn Rob [phone]) on 04/19/2019 12:45:53 PM   Radiology Dg Shoulder Left  Result Date: 04/19/2019 CLINICAL DATA:  Left shoulder pain, no known injury EXAM: LEFT SHOULDER - 2+ VIEW COMPARISON:  None. FINDINGS: No fracture or dislocation of the left shoulder. Mild glenohumeral arthrosis. The acromioclavicular joint is preserved. Aortic atherosclerosis. The partially imaged left chest is otherwise unremarkable. IMPRESSION: No fracture or dislocation of the  left shoulder. Mild glenohumeral arthrosis. The acromioclavicular joint is preserved. Electronically Signed   By: Eddie Candle M.D.   On: 04/19/2019 13:18    Procedures Procedures (including critical care time)  Medications Ordered in ED Medications  diazepam (VALIUM) tablet 2 mg (2 mg Oral Given 04/19/19 1318)  oxyCODONE-acetaminophen (PERCOCET/ROXICET) 5-325 MG per tablet 1 tablet (1 tablet Oral Given 04/19/19 1318)     Initial Impression / Assessment and Plan / ED Course  I have reviewed the triage vital signs and the nursing notes.  Pertinent labs & imaging results that were available during my care of the patient were reviewed by me and considered in my medical decision making (see chart for details).       16-year-old lady who presents the ER with left shoulder pain.  Based on symptomatology suspect muscle spasm, muscle strain.  X-ray negative, neurovascularly intact.  Given age, medical comorbidities, checked EKG and labs.  EKG without acute abnormalities, troponin within normal limits.  Believe most likely etiology MSK.  Will give trial muscle relaxar and recommend PCP recheck next week. Reviewed return precautions.    After the discussed management above, the patient was determined to be safe for discharge.  The patient was in agreement with this plan and all questions regarding their care were answered.  ED return precautions were discussed and the patient will return to the ED with any significant worsening of condition.   Final Clinical Impressions(s) / ED Diagnoses   Final diagnoses:  Muscle spasm  Shoulder strain, left, initial encounter    ED Discharge Orders  Ordered    methocarbamol (ROBAXIN) 500 MG tablet  Every 6 hours PRN     04/19/19 1452           Lucrezia Starch, MD 04/19/19 1610

## 2019-04-19 NOTE — ED Triage Notes (Signed)
Pt c/o left side upper back pain/ shoulder x 1 week with increased pain and left arm radiation that started last night. Pt reports hx of stents. Denies cough, denies sob denies N/V, denies CP.

## 2019-04-19 NOTE — Discharge Instructions (Signed)
Recommend taking Tylenol as well as Robaxin for pain and muscle spasms.  Recommend calling your primary doctor for recheck next week.  If you develop chest pain, difficulty breathing, numbness, weakness or other new concerning symptom recommend returning to ER for recheck.

## 2019-04-21 ENCOUNTER — Other Ambulatory Visit: Payer: Self-pay

## 2019-04-21 ENCOUNTER — Encounter: Payer: Self-pay | Admitting: Family Medicine

## 2019-04-21 ENCOUNTER — Ambulatory Visit (INDEPENDENT_AMBULATORY_CARE_PROVIDER_SITE_OTHER): Payer: Medicare HMO | Admitting: Family Medicine

## 2019-04-21 VITALS — BP 138/80 | HR 67 | Temp 97.5°F | Resp 18 | Ht 61.0 in | Wt 110.8 lb

## 2019-04-21 DIAGNOSIS — M25512 Pain in left shoulder: Secondary | ICD-10-CM

## 2019-04-21 MED ORDER — METHYLPREDNISOLONE ACETATE 80 MG/ML IJ SUSP
80.0000 mg | Freq: Once | INTRAMUSCULAR | Status: AC
  Administered 2019-04-21: 80 mg via INTRAMUSCULAR

## 2019-04-21 MED ORDER — TIZANIDINE HCL 4 MG PO TABS: 4.0000 mg | ORAL_TABLET | Freq: Four times a day (QID) | ORAL | 0 refills | Status: DC | PRN

## 2019-04-21 MED ORDER — TRAMADOL HCL 50 MG PO TABS: 50.0000 mg | ORAL_TABLET | Freq: Three times a day (TID) | ORAL | 0 refills | Status: AC | PRN

## 2019-04-21 NOTE — Progress Notes (Signed)
Patient ID: Leah [name], female    DOB: May 11, 1946  Age: 73 y.o. MRN: [id number]    Subjective:  Subjective  HPI Leah [name] presents for L shoulder and arm pain -- no known injury  Pt does have an appointment next Friday   Review of Systems  Constitutional: Negative for activity change, appetite change, fatigue and unexpected weight change.  Respiratory: Negative for cough and shortness of breath.   Cardiovascular: Negative for chest pain and palpitations.  Musculoskeletal: Positive for arthralgias and myalgias.  Psychiatric/Behavioral: Negative for behavioral problems and dysphoric mood. The patient is not nervous/anxious.     History Past Medical History:  Diagnosis Date  . Allergy   . Anemia   . Anxiety   . Barrett esophagus   . Bladder pain   . Chronic bronchitis ([place])    per pt last episode july 2016  . Complication of anesthesia    slow to wake  . COPD (chronic obstructive pulmonary disease) ([place])   . Depression   . Diverticulosis   . Frequency of urination   . GERD (gastroesophageal reflux disease)   . Hiatal hernia   . History of adenomatous polyp of colon    tubular adenoma  . History of chronic gastritis   . History of diverticulitis of colon   . History of esophageal dilatation    for stricture  . History of esophageal spasm   . Hyperlipidemia   . Hypertension   . IBS (irritable bowel syndrome)   . Interstitial cystitis   . PAD (peripheral artery disease) ([place])   . Productive cough    intermittant  . Smokers' cough ([place])   . Urgency of urination     She has a past surgical history that includes Carpal tunnel release (Right, 1993); Sinus surgery with [place] (1991); laparoscopic appendectomy (N/A, 09/21/2013); Anterior cervical decomp/discectomy fusion (N/A, 09/07/2014); Tonsillectomy and adenoidectomy (1964); Laparoscopic cholecystectomy (2002); Abdominal hysterectomy (1975); Temporomandibular joint surgery (1990); LAPAROSCOPY SIGMOID  COLECTOMY (10-05-2008); Microlaryngoscopy (09-27-2006); Colonoscopy (last one 04-30-2013); Esophagogastroduodenoscopy (last one 09-11-2012); CYSTO/  HOD/  BLADDER BX (1990's); cysto with hydrodistension (N/A, 06/08/2015); Lower Extremity Angiography (Left, 10/14/2018); PERIPHERAL VASCULAR INTERVENTION (10/14/2018); ABDOMINAL AORTOGRAM (N/A, 10/14/2018); and Lower Extremity Angiography (Bilateral, 10/14/2018).   Her family history includes Colon polyps in her mother; Diabetes in her father; Heart disease in her father; Hypertension in her father; Leukemia in her father; Pancreatic cancer in her father and mother; Prostate cancer in her father.She reports that she has been smoking cigarettes. She has a 47.00 pack-year smoking history. She has never used smokeless tobacco. She reports that she does not drink alcohol or use drugs.  Current Outpatient Medications on File Prior to Visit  Medication Sig Dispense Refill  . aspirin EC 81 MG EC tablet Take 1 tablet (81 mg total) by mouth daily.    Marland Kitchen atorvastatin (LIPITOR) 80 MG tablet Take 1 tablet (80 mg total) by mouth daily. 90 tablet 1  . clopidogrel (PLAVIX) 75 MG tablet TAKE 1 TABLET BY MOUTH EVERY DAY 90 tablet 3  . dicyclomine (BENTYL) 10 MG capsule Take 1 capsule (10 mg total) by mouth 2 (two) times daily as needed for spasms. 60 capsule 2  . esomeprazole (NEXIUM) 40 MG capsule Take 1 capsule (40 mg total) by mouth 2 (two) times daily before a meal. 180 capsule 0  . estradiol (CLIMARA - DOSED IN MG/24 HR) 0.1 mg/24hr patch Place 0.1 mg onto the skin every Monday.  0  . Evolocumab (REPATHA SURECLICK)  140 MG/ML SOAJ Inject 1 Dose into the skin every 14 (fourteen) days. (Patient taking differently: Inject 140 mg into the skin every 14 (fourteen) days. ) 2 pen 11  . ezetimibe (ZETIA) 10 MG tablet Take 1 tablet (10 mg total) by mouth daily. 90 tablet 1  . famotidine (PEPCID) 20 MG tablet Take 1 tablet (20 mg total) by mouth 2 (two) times daily. 180 tablet 0  .  levocetirizine (XYZAL) 5 MG tablet Take 1 tablet (5 mg total) by mouth every evening. 30 tablet 5  . lisinopril (ZESTRIL) 20 MG tablet Take 1 tablet (20 mg total) by mouth daily. 90 tablet 1  . LORazepam (ATIVAN) 0.5 MG tablet Take 0.5 mg by mouth at bedtime.   5  . sertraline (ZOLOFT) 50 MG tablet Take 50 mg by mouth at bedtime.    . sodium chloride (OCEAN) 0.65 % SOLN nasal spray Place 1 spray into both nostrils daily as needed for congestion.    Marland Kitchen linaclotide (LINZESS) 145 MCG CAPS capsule Take 1 capsule (145 mcg total) by mouth daily. (Patient not taking: Reported on 02/17/2019) 14 capsule 1   No current facility-administered medications on file prior to visit.      Objective:  Objective  Physical Exam Vitals signs reviewed.  Constitutional:      Appearance: She is well-developed.  HENT:     Head: Normocephalic and atraumatic.  Eyes:     Conjunctiva/sclera: Conjunctivae normal.  Neck:     Musculoskeletal: Normal range of motion and neck supple.     Thyroid: No thyromegaly.     Vascular: No carotid bruit or JVD.  Cardiovascular:     Rate and Rhythm: Normal rate and regular rhythm.     Heart sounds: Normal heart sounds. No murmur.  Pulmonary:     Effort: Pulmonary effort is normal. No respiratory distress.     Breath sounds: Normal breath sounds. No wheezing or rales.  Chest:     Chest wall: No tenderness.  Musculoskeletal:        General: Tenderness present.     Left shoulder: She exhibits decreased range of motion, tenderness, pain and spasm.     Left elbow: She exhibits decreased range of motion. Tenderness found.  Neurological:     Mental Status: She is alert and oriented to person, place, and time.    BP 138/80 (BP Location: Right Arm, Patient Position: Sitting, Cuff Size: Normal)   Pulse 67   Temp (!) 97.5 F (36.4 C) (Temporal)   Resp 18   Ht 5\' 1"  (1.549 m)   Wt 110 lb 12.8 oz (50.3 kg)   SpO2 96%   BMI 20.94 kg/m  Wt Readings from Last 3 Encounters:   04/21/19 110 lb 12.8 oz (50.3 kg)  04/19/19 113 lb (51.3 kg)  02/17/19 114 lb 6.4 oz (51.9 kg)     Lab Results  Component Value Date   WBC 6.5 04/19/2019   HGB 13.5 04/19/2019   HCT 43.1 04/19/2019   PLT 245 04/19/2019   GLUCOSE 110 (H) 04/19/2019   CHOL 65 01/07/2019   TRIG 90.0 01/07/2019   HDL 33.80 (L) 01/07/2019   LDLDIRECT 151.0 11/07/2016   LDLCALC 13 01/07/2019   ALT 8 01/07/2019   AST 12 01/07/2019   NA 137 04/19/2019   K 4.3 04/19/2019   CL 101 04/19/2019   CREATININE 0.78 04/19/2019   BUN 9 04/19/2019   CO2 25 04/19/2019   TSH 1.92 03/30/2016   INR 1.0 05/21/2009  HGBA1C 6.1 12/18/2017   MICROALBUR <0.7 11/09/2014    Dg Shoulder Left  Result Date: 04/19/2019 CLINICAL DATA:  Left shoulder pain, no known injury EXAM: LEFT SHOULDER - 2+ VIEW COMPARISON:  None. FINDINGS: No fracture or dislocation of the left shoulder. Mild glenohumeral arthrosis. The acromioclavicular joint is preserved. Aortic atherosclerosis. The partially imaged left chest is otherwise unremarkable. IMPRESSION: No fracture or dislocation of the left shoulder. Mild glenohumeral arthrosis. The acromioclavicular joint is preserved. Electronically Signed   By: Eddie Candle M.D.   On: 04/19/2019 13:18     Assessment & Plan:  Plan  I have discontinued Cirsten Kreiss. Lapiana's doxycycline and methocarbamol. I am also having her start on traMADol and tiZANidine. Additionally, I am having her maintain her sertraline, LORazepam, estradiol, sodium chloride, linaclotide, Evolocumab, aspirin, atorvastatin, esomeprazole, levocetirizine, famotidine, dicyclomine, ezetimibe, lisinopril, and clopidogrel. We will stop administering tiZANidine. Additionally, we administered methylPREDNISolone acetate.  Meds ordered this encounter  Medications  . traMADol (ULTRAM) 50 MG tablet    Sig: Take 1 tablet (50 mg total) by mouth every 8 (eight) hours as needed for up to 5 days.    Dispense:  15 tablet    Refill:  0  .  tiZANidine (ZANAFLEX) 4 MG tablet    Sig: Take 1 tablet (4 mg total) by mouth every 6 (six) hours as needed for muscle spasms.    Dispense:  30 tablet    Refill:  0  . methylPREDNISolone acetate (DEPO-MEDROL) injection 80 mg    Problem List Items Addressed This Visit    None    Visit Diagnoses    Acute pain of left shoulder    -  Primary   Relevant Medications   traMADol (ULTRAM) 50 MG tablet   tiZANidine (ZANAFLEX) 4 MG tablet   methylPREDNISolone acetate (DEPO-MEDROL) injection 80 mg (Completed)   Other Relevant Orders   Ambulatory referral to Orthopedic Surgery      Follow-up: Return if symptoms worsen or fail to improve.  [name], DO

## 2019-04-21 NOTE — Patient Instructions (Signed)
Shoulder Pain Many things can cause shoulder pain, including:  An injury to the shoulder.  Overuse of the shoulder.  Arthritis. The source of the pain can be:  Inflammation.  An injury to the shoulder joint.  An injury to a tendon, ligament, or bone. Follow these instructions at home: Pay attention to changes in your symptoms. Let your health care provider know about them. Follow these instructions to relieve your pain. If you have a sling:  Wear the sling as told by your health care provider. Remove it only as told by your health care provider.  Loosen the sling if your fingers tingle, become numb, or turn cold and blue.  Keep the sling clean.  If the sling is not waterproof: ? Do not let it get wet. Remove it to shower or bathe.  Move your arm as little as possible, but keep your hand moving to prevent swelling. Managing pain, stiffness, and swelling   If directed, put ice on the painful area: ? Put ice in a plastic bag. ? Place a towel between your skin and the bag. ? Leave the ice on for 20 minutes, 2-3 times per day. Stop applying ice if it does not help with the pain.  Squeeze a soft ball or a foam pad as much as possible. This helps to keep the shoulder from swelling. It also helps to strengthen the arm. General instructions  Take over-the-counter and prescription medicines only as told by your health care provider.  Keep all follow-up visits as told by your health care provider. This is important. Contact a health care provider if:  Your pain gets worse.  Your pain is not relieved with medicines.  New pain develops in your arm, hand, or fingers. Get help right away if:  Your arm, hand, or fingers: ? Tingle. ? Become numb. ? Become swollen. ? Become painful. ? Turn white or blue. Summary  Shoulder pain can be caused by an injury, overuse, or arthritis.  Pay attention to changes in your symptoms. Let your health care provider know about them.   This condition may be treated with a sling, ice, and pain medicines.  Contact your health care provider if the pain gets worse or new pain develops. Get help right away if your arm, hand, or fingers tingle or become numb, swollen, or painful.  Keep all follow-up visits as told by your health care provider. This is important. This information is not intended to replace advice given to you by your health care provider. Make sure you discuss any questions you have with your health care provider. Document Released: 04/26/2005 Document Revised: 01/29/2018 Document Reviewed: 01/29/2018 Elsevier Patient Education  2020 Else[employer] Inc[employer]  

## 2019-04-24 ENCOUNTER — Inpatient Hospital Stay: Admission: RE | Admit: 2019-04-24 | Payer: Medicare HMO | Source: Ambulatory Visit

## 2019-04-24 ENCOUNTER — Ambulatory Visit: Payer: Self-pay | Admitting: *Deleted

## 2019-04-24 NOTE — Telephone Encounter (Signed)
    [place]. [place] Female, 73 y.o., 1946-07-19 MRN:  [id number] Phone:  [phone] (H) PCP:  [name], DO Coverage:  [employer] Medicare/[employer] Hmo Next Appt With Gastroenterology 05/06/2019 at 3:55 PM Message from Jodie Echevaria sent at 04/24/2019 10:46 AM EDT  Patient called to say that since she started taking the tiZANidine (ZANAFLEX) 4 MG tablet and traMADol (ULTRAM) 50 MG tablet. States that she feel like she is burning up say that that her skin feel like it is burning and also get nauseous and that the medications are not helping with the pain that she have. Asking for a call back to see what she should do. Started the medication on Tuesday 04/22/2019 asking for a call back please. Ph# [phone]   Call History   Type Contact Phone User  04/24/2019 10:32 AM EDT Phone (Incoming) [place], Markelle Zaragoza (Self) [phone] Lemmie Evens) Jodie Echevaria

## 2019-04-24 NOTE — Telephone Encounter (Signed)
Patient began taking zanaflex and ultram Monday evening. Since then she experienced nausea on/off. Yesterday she began to feel itchy and a burning sensation on her skin. Last dose of zanaflex 4 MG was at 2:00am this morning. Has not had ultram since yesterday. Today the burning hot sensation on her skin and the itching continue and she feels nervous. These 2 medications are new for her. Denies a rash/swollen tongue/lips and does not feel her throat is closing-no difficulty swallowing/breathing. Advised stop the medications, drink lots of water. Discussed benadryl with her however it is contraindicated with these medications due to the drowsiness side effect. Routing to pcp for further advice on antihistamine. Patient will expect a call from the office. Reviewed urgent symptoms she would need to seek immediate evaluation should they arise.

## 2019-04-24 NOTE — Telephone Encounter (Signed)
Stop taking both  She then can take benadryl  She should see ortho tomorrow , I believe

## 2019-04-24 NOTE — Telephone Encounter (Signed)
Patient notified.  She stated that she is in a lot of pain but will try to get through til her appointment.  She will call back if needed if she cannot get through til tomorrow.

## 2019-04-25 DIAGNOSIS — M25522 Pain in left elbow: Secondary | ICD-10-CM | POA: Diagnosis not present

## 2019-04-25 DIAGNOSIS — M4722 Other spondylosis with radiculopathy, cervical region: Secondary | ICD-10-CM | POA: Diagnosis not present

## 2019-05-06 ENCOUNTER — Ambulatory Visit: Payer: Medicare HMO | Admitting: Gastroenterology

## 2019-05-06 DIAGNOSIS — I10 Essential (primary) hypertension: Secondary | ICD-10-CM | POA: Diagnosis not present

## 2019-05-06 DIAGNOSIS — M5412 Radiculopathy, cervical region: Secondary | ICD-10-CM | POA: Diagnosis not present

## 2019-05-14 ENCOUNTER — Encounter: Payer: Self-pay | Admitting: Gastroenterology

## 2019-05-14 DIAGNOSIS — M5412 Radiculopathy, cervical region: Secondary | ICD-10-CM | POA: Diagnosis not present

## 2019-05-14 DIAGNOSIS — M50223 Other cervical disc displacement at C6-C7 level: Secondary | ICD-10-CM | POA: Diagnosis not present

## 2019-05-20 DIAGNOSIS — M5412 Radiculopathy, cervical region: Secondary | ICD-10-CM | POA: Diagnosis not present

## 2019-05-21 ENCOUNTER — Telehealth: Payer: Self-pay | Admitting: *Deleted

## 2019-05-21 ENCOUNTER — Other Ambulatory Visit: Payer: Self-pay | Admitting: Student

## 2019-05-21 DIAGNOSIS — M5412 Radiculopathy, cervical region: Secondary | ICD-10-CM

## 2019-05-21 NOTE — Telephone Encounter (Signed)
   [facility] Pre-operative Risk Assessment    Request for surgical clearance:  1. What type of surgery is being performed? Cervical ESI  2. When is this surgery scheduled? TBD  3. What type of clearance is required (medical clearance vs. Pharmacy clearance to hold med vs. Both)? both  4. Are there any medications that need to be held prior to surgery and how long?plavix for 5 days prior   5. Practice name and name of physician performing surgery? [facility]   6. What is your office phone number [phone]    7.   What is your office fax number [phone]  8.   Anesthesia type (None, local, MAC, general) ? local   Leah [name] 05/21/2019, 5:40 PM  _________________________________________________________________   (provider comments below)

## 2019-05-21 NOTE — Telephone Encounter (Signed)
Dr. Gwenlyn Found  Can you please address this patients Plavix therapy? She is for a cervical ESI per [facility]. They are asking if she may hold Plavix for 5 days prior to procedure (TBD).   She was last seen by Kerin Ransom, PA on 02/17/2019 and was noted to be having claudication symptoms. She has a prior hx of PVD, s/p LCIA PTA and stenting March 2020.  LEA dopplers in April 2020 showed this site was patent. Follow up studies showed patent stents and essentially no change from prior studies. Pain was thought to be related to arthritis.   Please send your recommendations to the pre-op pool   Thank you  Sharee Pimple

## 2019-05-22 NOTE — Telephone Encounter (Signed)
Okay to interrupt her antiplatelet therapy for her procedure.

## 2019-05-22 NOTE — Telephone Encounter (Signed)
   Primary Cardiologist: Quay Burow, MD  Chart reviewed as part of pre-operative protocol coverage. Given past medical history and time since last visit, based on ACC/AHA guidelines, Leah [name] would be at acceptable risk for the planned procedure without further cardiovascular testing.   Per Dr. Gwenlyn Found, it will be acceptable to hold her Plavix up to 5 days for the procedure then resume once ok per procedural team.   I will route this recommendation to the requesting party via [facility] fax function and remove from pre-op pool.  Please call with questions.  Kathyrn Drown, NP 05/22/2019, 3:25 PM

## 2019-05-28 ENCOUNTER — Other Ambulatory Visit: Payer: Self-pay | Admitting: Family Medicine

## 2019-05-28 DIAGNOSIS — E785 Hyperlipidemia, unspecified: Secondary | ICD-10-CM

## 2019-05-29 ENCOUNTER — Other Ambulatory Visit: Payer: Self-pay

## 2019-05-29 ENCOUNTER — Ambulatory Visit
Admission: RE | Admit: 2019-05-29 | Discharge: 2019-05-29 | Disposition: A | Discharge status: Home / Self Care | Payer: Medicare HMO | Source: Ambulatory Visit | Attending: Student | Admitting: Student

## 2019-05-29 DIAGNOSIS — M542 Cervicalgia: Secondary | ICD-10-CM | POA: Diagnosis not present

## 2019-05-29 DIAGNOSIS — M5412 Radiculopathy, cervical region: Secondary | ICD-10-CM

## 2019-05-29 MED ORDER — TRIAMCINOLONE ACETONIDE 40 MG/ML IJ SUSP (RADIOLOGY): 60.0000 mg | Freq: Once | INTRAMUSCULAR | Status: DC

## 2019-05-29 MED ORDER — IOPAMIDOL (ISOVUE-M 300) INJECTION 61%
1.0000 mL | Freq: Once | INTRAMUSCULAR | Status: DC | PRN
  Administered 2019-05-29: 1 mL via EPIDURAL

## 2019-05-29 NOTE — Discharge Instructions (Signed)

## 2019-05-30 MED ORDER — TRIAMCINOLONE ACETONIDE 40 MG/ML IJ SUSP (RADIOLOGY): 60.0000 mg | Freq: Once | INTRAMUSCULAR | Status: DC

## 2019-05-30 MED ORDER — IOPAMIDOL (ISOVUE-M 300) INJECTION 61%
1.0000 mL | Freq: Once | INTRAMUSCULAR | Status: AC
  Administered 2019-05-29: 1 mL via EPIDURAL

## 2019-06-02 ENCOUNTER — Other Ambulatory Visit: Payer: Self-pay | Admitting: Family Medicine

## 2019-06-09 DIAGNOSIS — Z1231 Encounter for screening mammogram for malignant neoplasm of breast: Secondary | ICD-10-CM | POA: Diagnosis not present

## 2019-06-09 LAB — HM MAMMOGRAPHY

## 2019-06-12 ENCOUNTER — Other Ambulatory Visit: Payer: Self-pay | Admitting: Student

## 2019-06-12 DIAGNOSIS — M5412 Radiculopathy, cervical region: Secondary | ICD-10-CM

## 2019-06-13 ENCOUNTER — Ambulatory Visit (INDEPENDENT_AMBULATORY_CARE_PROVIDER_SITE_OTHER): Payer: Medicare HMO

## 2019-06-13 ENCOUNTER — Other Ambulatory Visit: Payer: Self-pay

## 2019-06-13 DIAGNOSIS — Z23 Encounter for immunization: Secondary | ICD-10-CM

## 2019-06-18 ENCOUNTER — Other Ambulatory Visit: Payer: Self-pay | Admitting: Family Medicine

## 2019-06-18 DIAGNOSIS — I10 Essential (primary) hypertension: Secondary | ICD-10-CM

## 2019-06-24 ENCOUNTER — Ambulatory Visit
Admission: RE | Admit: 2019-06-24 | Discharge: 2019-06-24 | Disposition: A | Discharge status: Home / Self Care | Payer: Medicare HMO | Source: Ambulatory Visit | Attending: Student | Admitting: Student

## 2019-06-24 ENCOUNTER — Other Ambulatory Visit: Payer: Self-pay

## 2019-06-24 DIAGNOSIS — M542 Cervicalgia: Secondary | ICD-10-CM | POA: Diagnosis not present

## 2019-06-24 DIAGNOSIS — M5412 Radiculopathy, cervical region: Secondary | ICD-10-CM

## 2019-06-24 MED ORDER — IOPAMIDOL (ISOVUE-M 300) INJECTION 61%
1.0000 mL | Freq: Once | INTRAMUSCULAR | Status: AC
  Administered 2019-06-24: 1 mL via EPIDURAL

## 2019-06-24 MED ORDER — TRIAMCINOLONE ACETONIDE 40 MG/ML IJ SUSP (RADIOLOGY)
60.0000 mg | Freq: Once | INTRAMUSCULAR | Status: AC
  Administered 2019-06-24: 60 mg via EPIDURAL

## 2019-06-24 NOTE — Discharge Instructions (Signed)

## 2019-07-07 ENCOUNTER — Encounter: Payer: Medicare HMO | Admitting: Family Medicine

## 2019-07-28 DIAGNOSIS — F325 Major depressive disorder, single episode, in full remission: Secondary | ICD-10-CM | POA: Diagnosis not present

## 2019-07-30 ENCOUNTER — Ambulatory Visit: Payer: Self-pay | Admitting: Family Medicine

## 2019-07-30 NOTE — Telephone Encounter (Signed)
Pt reports BP elevated past 2 days, 145/85 this evening. States "High for me." Reports past days 145-150/70s.  Also reports sinus tenderness, drainage and headache, "Like pressure across my eyes; reports earache "Pressure" right ear. Denies cough, afebrile. States headache 7/10, tylenol helps some."  Assured pt NT would route to practice for dr. Grover Canavan review as practice currently closed. Care advise given per protocol,pt verbalizes understanding.  Please advise: [phone]  Reason for Disposition . [id number] Systolic BP  >= [id number] OR Diastolic >= 80 AND [id number] taking BP medications  Answer Assessment - Initial Assessment Questions 1. BLOOD PRESSURE: "What is the blood pressure?" "Did you take at least two measurements 5 minutes apart?"    145/85 2. ONSET: "When did you take your blood pressure?"     1 hour ago 3. HOW: "How did you obtain the blood pressure?" (e.g., visiting nurse, automatic home BP monitor)     Home monitor, arm cuff 4. HISTORY: "Do you have a history of high blood pressure?"     yes 5. MEDICATIONS: "Are you taking any medications for blood pressure?" "Have you missed any doses recently?"     Yes, no missed doses 6. OTHER SYMPTOMS: "Do you have any symptoms?" (e.g., headache, chest pain, blurred vision, difficulty breathing, weakness)     Headache, 7/10, sinus pain, drainage, facial tenderness.  Protocols used: HIGH BLOOD PRESSURE-A-AH

## 2019-07-31 ENCOUNTER — Ambulatory Visit: Payer: Self-pay

## 2019-07-31 MED ORDER — DOXYCYCLINE HYCLATE 100 MG PO TABS: 100.0000 mg | ORAL_TABLET | Freq: Two times a day (BID) | ORAL | 0 refills | Status: DC

## 2019-07-31 NOTE — Telephone Encounter (Signed)
Spoke with patient, BP has been slightly elevated: 150/70, 145/85. She also reports that for the last 3 days she had some sinus congestion mild headache and ears are stopped up. Denies fevers, chest pain, difficulty breathing or cough. No history of asthma or emphysema. I recommend her to be tested for Covid, if positive she needs to call the answering service to get further advice. [place] testing site is closed, she will call [facility] and schedule this. In the meantime needs to take lots of fluids, use Tylenol, OTC cough medicines. She request an antibiotic, I sent doxycycline, to be used only if she is not gradually improving. Again she can call our answering service anytime if her symptoms increase. She verbalized understanding.

## 2019-07-31 NOTE — Telephone Encounter (Signed)
Call pt Monday and see how she is doing  Does she need virtual visit?

## 2019-07-31 NOTE — Telephone Encounter (Signed)
Pt. Called with questions about her elevated BP. Called pt. Back and she states "Dr. Larose Kells just called me and said he is calling me back in 5 minutes." Ended call so pt. Could receive call from doctor.

## 2019-07-31 NOTE — Telephone Encounter (Signed)
BP Readings from Last 3 Encounters:  06/24/19 (!) 169/76  05/29/19 130/72  04/21/19 138/80

## 2019-07-31 NOTE — Addendum Note (Signed)
Addended by: Kathlene November E on: 07/31/2019 02:01 PM   Modules accepted: Orders

## 2019-08-04 DIAGNOSIS — Z20828 Contact with and (suspected) exposure to other viral communicable diseases: Secondary | ICD-10-CM | POA: Diagnosis not present

## 2019-08-05 NOTE — Telephone Encounter (Signed)
VM left to call back regarding blood pressure

## 2019-08-25 ENCOUNTER — Telehealth: Payer: Self-pay | Admitting: Gastroenterology

## 2019-08-25 NOTE — Telephone Encounter (Signed)
Patient is calling states she was previously directed to use Miralax and was told to take more then directed but does not remember the dosage.

## 2019-08-25 NOTE — Telephone Encounter (Signed)
The pt was scheduled to se Dr Loletha Carrow on 2/24 to discuss constipation.  She will take miralax daily until the appt.

## 2019-08-25 NOTE — Telephone Encounter (Signed)
Please refer to Epic documentation, patient last seen in the office by Alonza Bogus on 06/19/18. Please direct to correct nurse. Thank you.

## 2019-08-27 ENCOUNTER — Other Ambulatory Visit: Payer: Self-pay

## 2019-08-27 ENCOUNTER — Encounter: Payer: Self-pay | Admitting: Family Medicine

## 2019-08-27 ENCOUNTER — Ambulatory Visit (INDEPENDENT_AMBULATORY_CARE_PROVIDER_SITE_OTHER): Payer: Medicare HMO | Admitting: Family Medicine

## 2019-08-27 VITALS — BP 122/73 | HR 98 | Ht 61.0 in

## 2019-08-27 DIAGNOSIS — J449 Chronic obstructive pulmonary disease, unspecified: Secondary | ICD-10-CM

## 2019-08-27 DIAGNOSIS — Z0000 Encounter for general adult medical examination without abnormal findings: Secondary | ICD-10-CM

## 2019-08-27 DIAGNOSIS — K5901 Slow transit constipation: Secondary | ICD-10-CM

## 2019-08-27 DIAGNOSIS — F341 Dysthymic disorder: Secondary | ICD-10-CM

## 2019-08-27 DIAGNOSIS — I10 Essential (primary) hypertension: Secondary | ICD-10-CM | POA: Diagnosis not present

## 2019-08-27 DIAGNOSIS — F17200 Nicotine dependence, unspecified, uncomplicated: Secondary | ICD-10-CM

## 2019-08-27 DIAGNOSIS — E785 Hyperlipidemia, unspecified: Secondary | ICD-10-CM | POA: Diagnosis not present

## 2019-08-27 NOTE — Assessment & Plan Note (Signed)
Pt cutting down

## 2019-08-27 NOTE — Assessment & Plan Note (Signed)
Stable

## 2019-08-27 NOTE — Assessment & Plan Note (Signed)
Well controlled, no changes to meds. Encouraged heart healthy diet such as the DASH diet and exercise as tolerated.  °

## 2019-08-27 NOTE — Patient Instructions (Signed)
Preventive Care 72 Years and Older, Female Preventive care refers to lifestyle choices and visits with your health care provider that can promote health and wellness. This includes:  A yearly physical exam. This is also called an annual well check.  Regular dental and eye exams.  Immunizations.  Screening for certain conditions.  Healthy lifestyle choices, such as diet and exercise. What can I expect for my preventive care visit? Physical exam Your health care provider will check:  Height and weight. These may be used to calculate body mass index (BMI), which is a measurement that tells if you are at a healthy weight.  Heart rate and blood pressure.  Your skin for abnormal spots. Counseling Your health care provider may ask you questions about:  Alcohol, tobacco, and drug use.  Emotional well-being.  Home and relationship well-being.  Sexual activity.  Eating habits.  History of falls.  Memory and ability to understand (cognition).  Work and work Statistician.  Pregnancy and menstrual history. What immunizations do I need?  Influenza (flu) vaccine  This is recommended every year. Tetanus, diphtheria, and pertussis (Tdap) vaccine  You may need a Td booster every 10 years. Varicella (chickenpox) vaccine  You may need this vaccine if you have not already been vaccinated. Zoster (shingles) vaccine  You may need this after age 72. Pneumococcal conjugate (PCV13) vaccine  One dose is recommended after age 72. Pneumococcal polysaccharide (PPSV23) vaccine  One dose is recommended after age 72. Measles, mumps, and rubella (MMR) vaccine  You may need at least one dose of MMR if you were born in 1957 or later. You may also need a second dose. Meningococcal conjugate (MenACWY) vaccine  You may need this if you have certain conditions. Hepatitis A vaccine  You may need this if you have certain conditions or if you travel or work in places where you may be exposed  to hepatitis A. Hepatitis B vaccine  You may need this if you have certain conditions or if you travel or work in places where you may be exposed to hepatitis B. Haemophilus influenzae type b (Hib) vaccine  You may need this if you have certain conditions. You may receive vaccines as individual doses or as more than one vaccine together in one shot (combination vaccines). Talk with your health care provider about the risks and benefits of combination vaccines. What tests do I need? Blood tests  Lipid and cholesterol levels. These may be checked every 5 years, or more frequently depending on your overall health.  Hepatitis C test.  Hepatitis B test. Screening  Lung cancer screening. You may have this screening every year starting at age 72 if you have a 30-pack-year history of smoking and currently smoke or have quit within the past 15 years.  Colorectal cancer screening. All adults should have this screening starting at age 72 and continuing until age 15. Your health care provider may recommend screening at age 23 if you are at increased risk. You will have tests every 1-10 years, depending on your results and the type of screening test.  Diabetes screening. This is done by checking your blood sugar (glucose) after you have not eaten for a while (fasting). You may have this done every 1-3 years.  Mammogram. This may be done every 1-2 years. Talk with your health care provider about how often you should have regular mammograms.  BRCA-related cancer screening. This may be done if you have a family history of breast, ovarian, tubal, or peritoneal cancers.  Other tests  Sexually transmitted disease (STD) testing.  Bone density scan. This is done to screen for osteoporosis. You may have this done starting at age 44. Follow these instructions at home: Eating and drinking  Eat a diet that includes fresh fruits and vegetables, whole grains, lean protein, and low-fat dairy products. Limit  your intake of foods with high amounts of sugar, saturated fats, and salt.  Take vitamin and mineral supplements as recommended by your health care provider.  Do not drink alcohol if your health care provider tells you not to drink.  If you drink alcohol: ? Limit how much you have to 0-1 drink a day. ? Be aware of how much alcohol is in your drink. In the [place] one drink equals one 12 oz bottle of beer (355 mL), one 5 oz glass of wine (148 mL), or one 1 oz glass of hard liquor (44 mL). Lifestyle  Take daily care of your teeth and gums.  Stay active. Exercise for at least 30 minutes on 5 or more days each week.  Do not use any products that contain nicotine or tobacco, such as cigarettes, e-cigarettes, and chewing tobacco. If you need help quitting, ask your health care provider.  If you are sexually active, practice safe sex. Use a condom or other form of protection in order to prevent STIs (sexually transmitted infections).  Talk with your health care provider about taking a low-dose aspirin or statin. What's next?  Go to your health care provider once a year for a well check visit.  Ask your health care provider how often you should have your eyes and teeth checked.  Stay up to date on all vaccines. This information is not intended to replace advice given to you by your health care provider. Make sure you discuss any questions you have with your health care provider. Document Revised: 07/11/2018 Document Reviewed: 07/11/2018 Elsevier Patient Education  2020 [employer].

## 2019-08-27 NOTE — Telephone Encounter (Signed)
Pt reported that she has been taking two doses of Miralax daily for the past three days and has not had a BM.

## 2019-08-27 NOTE — Assessment & Plan Note (Signed)
miralax per gi iinc fiber in diet Drink water F/u GI

## 2019-08-27 NOTE — Assessment & Plan Note (Signed)
ghm utd Check labs See AVS Pt to make labs app and get pneum vaccine  Discussed covid vaccine and answered all her questions about the vaccine

## 2019-08-27 NOTE — Assessment & Plan Note (Signed)
Tolerating statin, encouraged heart healthy diet, avoid trans fats, minimize simple carbs and saturated fats. Increase exercise as tolerated 

## 2019-08-27 NOTE — Assessment & Plan Note (Signed)
Per pulm 

## 2019-08-27 NOTE — Progress Notes (Signed)
Virtual Visit via Telephone Note  I connected with Leah [name] on 08/27/19 at 10:20 AM EST by telephone and verified that I am speaking with the correct person using two identifiers.  Location: Patient: home alone  Provider: home    I discussed the limitations, risks, security and privacy concerns of performing an evaluation and management service by telephone and the availability of in person appointments. I also discussed with the patient that there may be a patient responsible charge related to this service. The patient expressed understanding and agreed to proceed.   History of Present Illness:   pt is home and has no new complaints  Pt needs cpe  Observations/Objective: Today's Vitals   08/27/19 1012  BP: 122/73  Pulse: 98  Height: 5\' 1"  (1.549 m)   Body mass index is 20.94 kg/m. Pt is in nad   Assessment and Plan: 1. Essential hypertension Well controlled, no changes to meds. Encouraged heart healthy diet such as the DASH diet and exercise as tolerated.  - CBC with Differential/Platelet - Lipid panel - Comprehensive metabolic panel  2. Hyperlipidemia, unspecified hyperlipidemia type Tolerating statin, encouraged heart healthy diet, avoid trans fats, minimize simple carbs and saturated fats. Increase exercise as tolerated - CBC with Differential/Platelet - Lipid panel - Comprehensive metabolic panel  3. Preventative health care ghm utd Check labs See AVS Pt to make labs app and get pneum vaccine  Discussed covid vaccine and answered all her questions about the vaccine   4Dyslipidemia, goal LDL below 70 Tolerating statin, encouraged heart healthy diet, avoid trans fats, minimize simple carbs and saturated fats. Increase exercise as tolerated   5. Smoker Pt cutting down     6. ANXIETY DEPRESSION Stable con't meds   7. Slow transit constipation miralax per gi iinc fiber in diet Drink water F/u GI   8. COPD GOLD II Per pulm  Follow Up  Instructions:    I discussed the assessment and treatment plan with the patient. The patient was provided an opportunity to ask questions and all were answered. The patient agreed with the plan and demonstrated an understanding of the instructions.   The patient was advised to call back or seek an in-person evaluation if the symptoms worsen or if the condition fails to improve as anticipated.  I  30 minutes of non-face-to-face time during this encounter.   [name], DO

## 2019-08-27 NOTE — Telephone Encounter (Signed)
Dr. Loletha Carrow, please advise. Patient continues to complain of constipation despite two doses of Miralax daily x3 days, still no BM.

## 2019-08-28 NOTE — Telephone Encounter (Signed)
Sounds like she needs a colon cleanse.  Recommend 2 ducolax tablets.   Mix 238 gram miralax bottle in 2 liters gatorade.   Drink one 8 ounce cup every 30 minutes until complete.   Following day, resume miralax one capful twice daily.  I'll see her for clinic appointment as scheduled

## 2019-08-28 NOTE — Telephone Encounter (Signed)
Dr. Loletha Carrow, please advise. The patient stated today, she has not had a BM in about "9 or 10 days." The patient has been taking two doses of Miralax for 3 days with no BM.

## 2019-08-28 NOTE — Telephone Encounter (Signed)
Patient notified of Dr. Loletha Carrow' recommendations. Patient will call tomorrow with an update.

## 2019-08-28 NOTE — Telephone Encounter (Signed)
Patent calling back regarding below.  States she still has not had BM

## 2019-08-29 ENCOUNTER — Ambulatory Visit: Payer: Medicare HMO | Admitting: Cardiovascular Disease

## 2019-09-02 ENCOUNTER — Encounter: Payer: Self-pay | Admitting: Cardiovascular Disease

## 2019-09-02 ENCOUNTER — Other Ambulatory Visit: Payer: Self-pay

## 2019-09-02 ENCOUNTER — Ambulatory Visit: Payer: Medicare HMO | Admitting: Cardiovascular Disease

## 2019-09-02 DIAGNOSIS — I739 Peripheral vascular disease, unspecified: Secondary | ICD-10-CM | POA: Diagnosis not present

## 2019-09-02 DIAGNOSIS — F17200 Nicotine dependence, unspecified, uncomplicated: Secondary | ICD-10-CM

## 2019-09-02 NOTE — Progress Notes (Signed)
09/02/2019 Louvena Fess Cypert   03/05/1946  DO:[id number]  Primary Physician [name], DO Primary Cardiologist: Lorretta Harp MD Lupe Carney, [place]  HPI:  Leah [name] is a 74 y.o.  thin appearing married Caucasian female, mother 82, grandmother of 5 grandchildren referred by Dr. Debara Pickett for peripheral vascular valuation because of left lower extremity claudication.    I last saw her in the office 09/25/2018.  Her risk factors include 50 pack years of tobacco abuse smoking 1 pack/day, treated hypertension and severe hyperlipidemia.  She does have a strong family history for heart disease with a father who had CABG at age 32 and brother who is had stents.  She is never had a heart attack or stroke.  She denies chest pain or shortness of breath.  She does have a daughter age 64 who has had what sounds like an aortobifemoral bypass graft.  She is complained of left hip claudication for several years and has been seen by an orthopedic surgeon.  She is been getting hip injections for this pain and was told that she had "bursitis".  I performed angiography on her 10/14/2018 revealing an occluded left common iliac artery.  She underwent PTA and covered stenting using overlapping VBX stents (7 x 59, 7 x 29) is resulted in resolution of her claudication symptoms and normalization of her Dopplers.  Her most recent Doppler study performed 03/04/2019 revealed a widely patent stent.  She continues to deny claudication.  Current Meds  Medication Sig  . aspirin EC 81 MG EC tablet Take 1 tablet (81 mg total) by mouth daily.  Marland Kitchen atorvastatin (LIPITOR) 80 MG tablet TAKE 1 TABLET (80 MG TOTAL) BY MOUTH DAILY.  Marland Kitchen clopidogrel (PLAVIX) 75 MG tablet TAKE 1 TABLET BY MOUTH EVERY DAY  . dicyclomine (BENTYL) 10 MG capsule Take 1 capsule (10 mg total) by mouth 2 (two) times daily as needed for spasms.  Marland Kitchen doxycycline (VIBRA-TABS) 100 MG tablet Take 1 tablet (100 mg total) by mouth 2 (two) times daily.  Marland Kitchen  esomeprazole (NEXIUM) 40 MG capsule Take 1 capsule (40 mg total) by mouth 2 (two) times daily before a meal.  . estradiol (CLIMARA - DOSED IN MG/24 HR) 0.1 mg/24hr patch Place 0.1 mg onto the skin every Monday.  . Evolocumab (REPATHA SURECLICK) [id number] MG/ML SOAJ Inject 1 Dose into the skin every 14 (fourteen) days. (Patient taking differently: Inject 140 mg into the skin every 14 (fourteen) days. )  . ezetimibe (ZETIA) 10 MG tablet Take 1 tablet (10 mg total) by mouth daily.  . famotidine (PEPCID) 20 MG tablet TAKE 1 TABLET TWICE DAILY  . levocetirizine (XYZAL) 5 MG tablet Take 1 tablet (5 mg total) by mouth every evening.  . linaclotide (LINZESS) 145 MCG CAPS capsule Take 1 capsule (145 mcg total) by mouth daily.  Marland Kitchen lisinopril (ZESTRIL) 20 MG tablet TAKE 1 TABLET BY MOUTH EVERY DAY  . LORazepam (ATIVAN) 0.5 MG tablet Take 0.5 mg by mouth at bedtime.   . sertraline (ZOLOFT) 50 MG tablet Take 50 mg by mouth at bedtime.  . sodium chloride (OCEAN) 0.65 % SOLN nasal spray Place 1 spray into both nostrils daily as needed for congestion.  Marland Kitchen tiZANidine (ZANAFLEX) 4 MG tablet Take 1 tablet (4 mg total) by mouth every 6 (six) hours as needed for muscle spasms.     Allergies  Allergen Reactions  . Amoxicillin-Pot Clavulanate Shortness Of Breath, Itching and Swelling  . Avelox [Moxifloxacin  Hcl In Nacl] Swelling    Swelling, itching and shortness of breath  . Ciprofloxacin Shortness Of Breath, Itching and Swelling  . Keflex [Cephalexin] Shortness Of Breath and Swelling  . Penicillins Shortness Of Breath, Itching and Swelling    Did it involve swelling of the face/tongue/throat, SOB, or low BP? Yes Did it involve sudden or severe rash/hives, skin peeling, or any reaction on the inside of your mouth or nose? No Did you need to seek medical attention at a hospital or doctor's office? Yes When did it last happen?Several years If all above answers are "NO", may proceed with cephalosporin use.   .  Sulfa Antibiotics Shortness Of Breath, Itching and Swelling  . Prednisone Other (See Comments)    GI irritation; is able to tolerate injections  . Acyclovir And Related Other (See Comments)    Pt does not recall this reaction  . Clindamycin/Lincomycin Itching and Swelling  . Levaquin [Levofloxacin] Hives  . Zyvox [Linezolid] Diarrhea    Social History   Socioeconomic History  . Marital status: Married    Spouse name: Not on file  . Number of children: Not on file  . Years of education: Not on file  . Highest education level: Not on file  Occupational History  . Not on file  Tobacco Use  . Smoking status: Current Every Day Smoker    Packs/day: 0.75    Years: 47.00    Pack years: 35.25    Types: Cigarettes  . Smokeless tobacco: Never Used  . Tobacco comment: counseled about smoking cessation  Substance and Sexual Activity  . Alcohol use: No    Alcohol/week: 0.0 standard drinks  . Drug use: No  . Sexual activity: Not Currently    Birth control/protection: Post-menopausal  Other Topics Concern  . Not on file  Social History Narrative  . Not on file   Social Determinants of Health   Financial Resource Strain:   . Difficulty of Paying Living Expenses: Not on file  Food Insecurity:   . Worried About Charity fundraiser in the Last Year: Not on file  . Ran Out of Food in the Last Year: Not on file  Transportation Needs:   . Lack of Transportation (Medical): Not on file  . Lack of Transportation (Non-Medical): Not on file  Physical Activity:   . Days of Exercise per Week: Not on file  . Minutes of Exercise per Session: Not on file  Stress:   . Feeling of Stress : Not on file  Social Connections:   . Frequency of Communication with Friends and Family: Not on file  . Frequency of Social Gatherings with Friends and Family: Not on file  . Attends Religious Services: Not on file  . Active Member of Clubs or Organizations: Not on file  . Attends Archivist  Meetings: Not on file  . Marital Status: Not on file  Intimate Partner Violence:   . Fear of Current or Ex-Partner: Not on file  . Emotionally Abused: Not on file  . Physically Abused: Not on file  . Sexually Abused: Not on file     Review of Systems: General: negative for chills, fever, night sweats or weight changes.  Cardiovascular: negative for chest pain, dyspnea on exertion, edema, orthopnea, palpitations, paroxysmal nocturnal dyspnea or shortness of breath Dermatological: negative for rash Respiratory: negative for cough or wheezing Urologic: negative for hematuria Abdominal: negative for nausea, vomiting, diarrhea, bright red blood per rectum, melena, or hematemesis Neurologic: negative  for visual changes, syncope, or dizziness All other systems reviewed and are otherwise negative except as noted above.    Blood pressure 119/71, pulse 83, height 5' 1.5" (1.562 m), weight 112 lb (50.8 kg).  General appearance: alert and no distress Neck: no adenopathy, no carotid bruit, no JVD, supple, symmetrical, trachea midline and thyroid not enlarged, symmetric, no tenderness/mass/nodules Lungs: clear to auscultation bilaterally Heart: regular rate and rhythm, S1, S2 normal, no murmur, click, rub or gallop Extremities: extremities normal, atraumatic, no cyanosis or edema Pulses: 2+ and symmetric Skin: Skin color, texture, turgor normal. No rashes or lesions Neurologic: Alert and oriented X 3, normal strength and tone. Normal symmetric reflexes. Normal coordination and gait  EKG not performed today  ASSESSMENT AND PLAN:   Smoker Ongoing tobacco abuse of 8 cigarettes a day recalcitrant risk factor modification.  PAD (peripheral artery disease) (HCC) History of peripheral arterial disease status post left common iliac artery PTA and covered stenting by myself 10/14/2018 with overlapping VBX covered stents (7 x 59, 7 x 29).  Her Dopplers normalized and her claudication resolved.  Her  most recent Dopplers performed 03/24/2019 revealed this to be widely patent.      Lorretta Harp MD FACP,FACC,FAHA, [facility] 09/02/2019 9:53 AM

## 2019-09-02 NOTE — Assessment & Plan Note (Signed)
History of peripheral arterial disease status post left common iliac artery PTA and covered stenting by myself 10/14/2018 with overlapping VBX covered stents (7 x 59, 7 x 29).  Her Dopplers normalized and her claudication resolved.  Her most recent Dopplers performed 03/24/2019 revealed this to be widely patent.

## 2019-09-02 NOTE — Assessment & Plan Note (Signed)
Ongoing tobacco abuse of 8 cigarettes a day recalcitrant risk factor modification.

## 2019-09-02 NOTE — Patient Instructions (Signed)
Medication Instructions:  Your physician recommends that you continue on your current medications as directed. Please refer to the Current Medication list given to you today.  If you need a refill on your cardiac medications before your next appointment, please call your pharmacy.   Lab work: NONE  Testing/Procedures: IN AUGUST 2021 Your physician has requested that you have a lower extremity arterial exercise duplex. During this test, exercise and ultrasound are used to evaluate arterial blood flow in the legs. Allow one hour for this exam. There are no restrictions or special instructions.  AND  Your physician has requested that you have an ankle brachial index (ABI). During this test an ultrasound and blood pressure cuff are used to evaluate the arteries that supply the arms and legs with blood. Allow thirty minutes for this exam. There are no restrictions or special instructions.  Follow-Up: At [facility], you and your health needs are our priority.  As part of our continuing mission to provide you with exceptional heart care, we have created designated Provider Care Teams.  These Care Teams include your primary Cardiologist (physician) and Advanced Practice Providers (APPs -  Physician Assistants and Nurse Practitioners) who all work together to provide you with the care you need, when you need it. You may see Quay Burow, MD or one of the following Advanced Practice Providers on your designated Care Team:    Kerin Ransom, PA-C  [place], [place]  Coletta Memos, [place]  Your physician wants you to follow-up in: [place]

## 2019-09-03 ENCOUNTER — Telehealth: Payer: Self-pay | Admitting: Gastroenterology

## 2019-09-03 NOTE — Telephone Encounter (Signed)
Dr. Loletha Carrow, this patient has called several times over the last few weeks, mainly with c/o constipation but now she states she vomited twice and is nauseous. Due to the many phone calls and complaints she is now scheduled to see JL tomorrow at 2 pm. Does she need to keep her appt with you on 2/24? Please advise?

## 2019-09-03 NOTE — Telephone Encounter (Signed)
Pt reported that she has been experiencing abdominal pain and N/V.  Please advise.

## 2019-09-04 ENCOUNTER — Other Ambulatory Visit: Payer: Self-pay | Admitting: Internal Medicine

## 2019-09-04 ENCOUNTER — Inpatient Hospital Stay (HOSPITAL_COMMUNITY)
Admission: EM | Admit: 2019-09-04 | Discharge: 2019-09-09 | DRG: 390 | Disposition: A | Discharge status: Home / Self Care | Payer: Medicare HMO | Attending: Family Medicine | Admitting: Family Medicine

## 2019-09-04 ENCOUNTER — Ambulatory Visit: Payer: Medicare HMO | Admitting: Physician Assistant

## 2019-09-04 ENCOUNTER — Encounter: Payer: Self-pay | Admitting: Physician Assistant

## 2019-09-04 ENCOUNTER — Other Ambulatory Visit (INDEPENDENT_AMBULATORY_CARE_PROVIDER_SITE_OTHER): Payer: Medicare HMO

## 2019-09-04 ENCOUNTER — Ambulatory Visit (HOSPITAL_COMMUNITY)
Admission: RE | Admit: 2019-09-04 | Discharge: 2019-09-04 | Disposition: A | Discharge status: Home / Self Care | Payer: Medicare HMO | Source: Ambulatory Visit | Attending: Physician Assistant | Admitting: Physician Assistant

## 2019-09-04 ENCOUNTER — Other Ambulatory Visit: Payer: Self-pay

## 2019-09-04 ENCOUNTER — Encounter (HOSPITAL_COMMUNITY): Payer: Self-pay

## 2019-09-04 VITALS — BP 126/62 | HR 88 | Temp 97.8°F | Ht 61.0 in | Wt 110.8 lb

## 2019-09-04 DIAGNOSIS — F419 Anxiety disorder, unspecified: Secondary | ICD-10-CM | POA: Diagnosis present

## 2019-09-04 DIAGNOSIS — I739 Peripheral vascular disease, unspecified: Secondary | ICD-10-CM | POA: Diagnosis present

## 2019-09-04 DIAGNOSIS — R6883 Chills (without fever): Secondary | ICD-10-CM

## 2019-09-04 DIAGNOSIS — Z79899 Other long term (current) drug therapy: Secondary | ICD-10-CM | POA: Diagnosis not present

## 2019-09-04 DIAGNOSIS — Z7982 Long term (current) use of aspirin: Secondary | ICD-10-CM

## 2019-09-04 DIAGNOSIS — R1084 Generalized abdominal pain: Secondary | ICD-10-CM

## 2019-09-04 DIAGNOSIS — K5900 Constipation, unspecified: Secondary | ICD-10-CM | POA: Diagnosis not present

## 2019-09-04 DIAGNOSIS — Z980 Intestinal bypass and anastomosis status: Secondary | ICD-10-CM

## 2019-09-04 DIAGNOSIS — R109 Unspecified abdominal pain: Secondary | ICD-10-CM

## 2019-09-04 DIAGNOSIS — Z95820 Peripheral vascular angioplasty status with implants and grafts: Secondary | ICD-10-CM | POA: Diagnosis not present

## 2019-09-04 DIAGNOSIS — K5669 Other partial intestinal obstruction: Secondary | ICD-10-CM | POA: Diagnosis not present

## 2019-09-04 DIAGNOSIS — Z888 Allergy status to other drugs, medicaments and biological substances status: Secondary | ICD-10-CM | POA: Diagnosis not present

## 2019-09-04 DIAGNOSIS — F17210 Nicotine dependence, cigarettes, uncomplicated: Secondary | ICD-10-CM | POA: Diagnosis present

## 2019-09-04 DIAGNOSIS — Z800 Family history of malignant neoplasm of digestive organs: Secondary | ICD-10-CM | POA: Diagnosis not present

## 2019-09-04 DIAGNOSIS — E785 Hyperlipidemia, unspecified: Secondary | ICD-10-CM | POA: Diagnosis not present

## 2019-09-04 DIAGNOSIS — R194 Change in bowel habit: Secondary | ICD-10-CM | POA: Insufficient documentation

## 2019-09-04 DIAGNOSIS — R197 Diarrhea, unspecified: Secondary | ICD-10-CM | POA: Diagnosis not present

## 2019-09-04 DIAGNOSIS — F329 Major depressive disorder, single episode, unspecified: Secondary | ICD-10-CM | POA: Diagnosis present

## 2019-09-04 DIAGNOSIS — K5909 Other constipation: Secondary | ICD-10-CM | POA: Diagnosis not present

## 2019-09-04 DIAGNOSIS — Z806 Family history of leukemia: Secondary | ICD-10-CM | POA: Diagnosis not present

## 2019-09-04 DIAGNOSIS — R112 Nausea with vomiting, unspecified: Secondary | ICD-10-CM

## 2019-09-04 DIAGNOSIS — Z881 Allergy status to other antibiotic agents status: Secondary | ICD-10-CM | POA: Diagnosis not present

## 2019-09-04 DIAGNOSIS — J441 Chronic obstructive pulmonary disease with (acute) exacerbation: Secondary | ICD-10-CM | POA: Diagnosis not present

## 2019-09-04 DIAGNOSIS — Z880 Allergy status to penicillin: Secondary | ICD-10-CM | POA: Diagnosis not present

## 2019-09-04 DIAGNOSIS — Z7902 Long term (current) use of antithrombotics/antiplatelets: Secondary | ICD-10-CM | POA: Diagnosis not present

## 2019-09-04 DIAGNOSIS — Z8249 Family history of ischemic heart disease and other diseases of the circulatory system: Secondary | ICD-10-CM

## 2019-09-04 DIAGNOSIS — R1032 Left lower quadrant pain: Secondary | ICD-10-CM | POA: Diagnosis not present

## 2019-09-04 DIAGNOSIS — K56600 Partial intestinal obstruction, unspecified as to cause: Secondary | ICD-10-CM | POA: Diagnosis present

## 2019-09-04 DIAGNOSIS — K219 Gastro-esophageal reflux disease without esophagitis: Secondary | ICD-10-CM | POA: Diagnosis present

## 2019-09-04 DIAGNOSIS — Z20822 Contact with and (suspected) exposure to covid-19: Secondary | ICD-10-CM | POA: Diagnosis present

## 2019-09-04 DIAGNOSIS — Z882 Allergy status to sulfonamides status: Secondary | ICD-10-CM

## 2019-09-04 DIAGNOSIS — K449 Diaphragmatic hernia without obstruction or gangrene: Secondary | ICD-10-CM | POA: Diagnosis present

## 2019-09-04 DIAGNOSIS — K567 Ileus, unspecified: Secondary | ICD-10-CM | POA: Diagnosis not present

## 2019-09-04 DIAGNOSIS — I10 Essential (primary) hypertension: Secondary | ICD-10-CM | POA: Diagnosis not present

## 2019-09-04 DIAGNOSIS — K529 Noninfective gastroenteritis and colitis, unspecified: Secondary | ICD-10-CM | POA: Diagnosis not present

## 2019-09-04 DIAGNOSIS — K56609 Unspecified intestinal obstruction, unspecified as to partial versus complete obstruction: Secondary | ICD-10-CM | POA: Diagnosis not present

## 2019-09-04 DIAGNOSIS — R933 Abnormal findings on diagnostic imaging of other parts of digestive tract: Secondary | ICD-10-CM | POA: Diagnosis not present

## 2019-09-04 DIAGNOSIS — J449 Chronic obstructive pulmonary disease, unspecified: Secondary | ICD-10-CM | POA: Diagnosis present

## 2019-09-04 HISTORY — DX: Partial intestinal obstruction, unspecified as to cause: K56.600

## 2019-09-04 LAB — CBC WITH DIFFERENTIAL/PLATELET
Basophils Absolute: 0.1 10*3/uL (ref 0.0–0.1)
Basophils Relative: 0.8 % (ref 0.0–3.0)
Eosinophils Absolute: 0.3 10*3/uL (ref 0.0–0.7)
Eosinophils Relative: 4 % (ref 0.0–5.0)
HCT: 42.1 % (ref 36.0–46.0)
Hemoglobin: 13.8 g/dL (ref 12.0–15.0)
Lymphocytes Relative: 24.7 % (ref 12.0–46.0)
Lymphs Abs: 1.6 10*3/uL (ref 0.7–4.0)
MCHC: 32.9 g/dL (ref 30.0–36.0)
MCV: 92.6 fl (ref 78.0–100.0)
Monocytes Absolute: 0.5 10*3/uL (ref 0.1–1.0)
Monocytes Relative: 7.3 % (ref 3.0–12.0)
Neutro Abs: 4.2 10*3/uL (ref 1.4–7.7)
Neutrophils Relative %: 63.2 % (ref 43.0–77.0)
Platelets: 251 10*3/uL (ref 150.0–400.0)
RBC: 4.55 Mil/uL (ref 3.87–5.11)
RDW: 13.8 % (ref 11.5–15.5)
WBC: 6.7 10*3/uL (ref 4.0–10.5)

## 2019-09-04 LAB — COMPREHENSIVE METABOLIC PANEL
ALT: 8 U/L (ref 0–35)
AST: 11 U/L (ref 0–37)
Albumin: 4.2 g/dL (ref 3.5–5.2)
Alkaline Phosphatase: 73 U/L (ref 39–117)
BUN: 12 mg/dL (ref 6–23)
CO2: 29 mEq/L (ref 19–32)
Calcium: 9.2 mg/dL (ref 8.4–10.5)
Chloride: 102 mEq/L (ref 96–112)
Creatinine, Ser: 0.79 mg/dL (ref 0.40–1.20)
GFR: 71.69 mL/min (ref >60.00)
Glucose, Bld: 101 mg/dL — ABNORMAL HIGH (ref 70–99)
Potassium: 4.3 mEq/L (ref 3.5–5.1)
Sodium: 137 mEq/L (ref 135–145)
Total Bilirubin: 0.3 mg/dL (ref 0.2–1.2)
Total Protein: 6.8 g/dL (ref 6.0–8.3)

## 2019-09-04 LAB — URINALYSIS, ROUTINE W REFLEX MICROSCOPIC
Bilirubin Urine: NEGATIVE
Glucose, UA: NEGATIVE mg/dL
Hgb urine dipstick: NEGATIVE
Ketones, ur: NEGATIVE mg/dL
Leukocytes,Ua: NEGATIVE
Nitrite: NEGATIVE
Protein, ur: NEGATIVE mg/dL
Specific Gravity, Urine: 1.013 (ref 1.005–1.030)
pH: 6 (ref 5.0–8.0)

## 2019-09-04 MED ORDER — METRONIDAZOLE IN NACL 5-0.79 MG/ML-% IV SOLN
500.0000 mg | Freq: Once | INTRAVENOUS | Status: AC
  Administered 2019-09-05: 500 mg via INTRAVENOUS
  Filled 2019-09-04: qty 100

## 2019-09-04 MED ORDER — PANTOPRAZOLE SODIUM 40 MG IV SOLR
40.0000 mg | INTRAVENOUS | Status: DC
  Administered 2019-09-05 – 2019-09-09 (×4): 40 mg via INTRAVENOUS
  Filled 2019-09-04 (×6): qty 40

## 2019-09-04 MED ORDER — LORAZEPAM 2 MG/ML IJ SOLN: 0.5000 mg | Freq: Every evening | INTRAMUSCULAR | Status: DC | PRN

## 2019-09-04 MED ORDER — NICOTINE 14 MG/24HR TD PT24: 14.0000 mg | MEDICATED_PATCH | Freq: Every day | TRANSDERMAL | Status: DC | PRN

## 2019-09-04 MED ORDER — MORPHINE SULFATE (PF) 4 MG/ML IV SOLN
4.0000 mg | Freq: Once | INTRAVENOUS | Status: AC
  Administered 2019-09-04: 4 mg via INTRAVENOUS
  Filled 2019-09-04: qty 1

## 2019-09-04 MED ORDER — SODIUM CHLORIDE 0.9% FLUSH
3.0000 mL | Freq: Two times a day (BID) | INTRAVENOUS | Status: DC
  Administered 2019-09-04 – 2019-09-07 (×4): 3 mL via INTRAVENOUS

## 2019-09-04 MED ORDER — IOHEXOL 9 MG/ML PO SOLN
500.0000 mL | ORAL | Status: AC
  Administered 2019-09-04 (×2): 500 mL via ORAL

## 2019-09-04 MED ORDER — ONDANSETRON HCL 4 MG/2ML IJ SOLN: 4.0000 mg | Freq: Four times a day (QID) | INTRAMUSCULAR | Status: DC | PRN

## 2019-09-04 MED ORDER — ESTRADIOL 0.1 MG/24HR TD PTWK
0.1000 mg | MEDICATED_PATCH | TRANSDERMAL | Status: DC
  Administered 2019-09-08: 0.1 mg via TRANSDERMAL
  Filled 2019-09-04: qty 1

## 2019-09-04 MED ORDER — IOHEXOL 300 MG/ML  SOLN
100.0000 mL | Freq: Once | INTRAMUSCULAR | Status: AC | PRN
  Administered 2019-09-04: 17:00:00 100 mL via INTRAVENOUS

## 2019-09-04 MED ORDER — SODIUM CHLORIDE 0.9 % IV SOLN
1000.0000 mL | INTRAVENOUS | Status: DC
  Administered 2019-09-04 – 2019-09-07 (×7): 1000 mL via INTRAVENOUS

## 2019-09-04 MED ORDER — SODIUM CHLORIDE 0.9 % IV BOLUS (SEPSIS)
1000.0000 mL | Freq: Once | INTRAVENOUS | Status: AC
  Administered 2019-09-04: 1000 mL via INTRAVENOUS

## 2019-09-04 MED ORDER — ACETAMINOPHEN 650 MG RE SUPP: 650.0000 mg | Freq: Four times a day (QID) | RECTAL | Status: DC | PRN

## 2019-09-04 MED ORDER — SODIUM CHLORIDE 0.9 % IV SOLN: 1.0000 g | Freq: Three times a day (TID) | INTRAVENOUS | Status: DC

## 2019-09-04 MED ORDER — MORPHINE SULFATE (PF) 2 MG/ML IV SOLN
2.0000 mg | INTRAVENOUS | Status: DC | PRN
  Administered 2019-09-05: 01:00:00 2 mg via INTRAVENOUS
  Filled 2019-09-04: qty 1

## 2019-09-04 MED ORDER — ACETAMINOPHEN 325 MG PO TABS
650.0000 mg | ORAL_TABLET | Freq: Four times a day (QID) | ORAL | Status: DC | PRN
  Administered 2019-09-05: 650 mg via ORAL
  Filled 2019-09-04: qty 2

## 2019-09-04 MED ORDER — SODIUM CHLORIDE 0.9 % IV SOLN
2.0000 g | Freq: Three times a day (TID) | INTRAVENOUS | Status: DC
  Administered 2019-09-04: 2 g via INTRAVENOUS
  Filled 2019-09-04: qty 2

## 2019-09-04 NOTE — ED Notes (Signed)
ED TO INPATIENT HANDOFF REPORT  ED Nurse Name and Phone #: Gabriel Cirri jon   S Name/Age/Gender Leah [name] 74 y.o. female Room/Bed: WA16/WA16  Code Status   Code Status: Full Code  Home/SNF/Other Home Patient oriented to: self, place, time and situation Is this baseline? Yes   Triage Complete: Triage complete  Chief Complaint SBO (small bowel obstruction) ([place]) [N46.270]  Triage Note Pt sent here by PCP. Pt had a CT scan done today that confirmed a bowel obstruction. Pt reports feeling progressively worse over the last week with N/V and abdominal pain and distention.     Allergies Allergies  Allergen Reactions  . Amoxicillin-Pot Clavulanate Shortness Of Breath, Itching and Swelling  . Avelox [Moxifloxacin Hcl In Nacl] Swelling    Swelling, itching and shortness of breath  . Ciprofloxacin Shortness Of Breath, Itching and Swelling  . Keflex [Cephalexin] Shortness Of Breath and Swelling  . Penicillins Shortness Of Breath, Itching and Swelling    Did it involve swelling of the face/tongue/throat, SOB, or low BP? Yes Did it involve sudden or severe rash/hives, skin peeling, or any reaction on the inside of your mouth or nose? No Did you need to seek medical attention at a hospital or doctor's office? Yes When did it last happen?Several years If all above answers are "NO", may proceed with cephalosporin use.   . Sulfa Antibiotics Shortness Of Breath, Itching and Swelling  . Prednisone Other (See Comments)    GI irritation; is able to tolerate injections  . Acyclovir And Related Other (See Comments)    Pt does not recall this reaction  . Clindamycin/Lincomycin Itching and Swelling  . Levaquin [Levofloxacin] Hives  . Zyvox [Linezolid] Diarrhea    Level of Care/Admitting Diagnosis ED Disposition    ED Disposition Condition Comment   Admit  Hospital Area: [facility] [[id number]]  Level of Care: Med-Surg [16]  Covid Evaluation: Asymptomatic  Screening Protocol (No Symptoms)  Diagnosis: SBO (small bowel obstruction) [facility]) [[id number]]  Admitting Physician: Rhetta Mura [[id number]]  Attending Physician: Rhetta Mura [[id number]]  Estimated length of stay: past midnight tomorrow  Certification:: I certify this patient will need inpatient services for at least 2 midnights       B Medical/Surgery History Past Medical History:  Diagnosis Date  . Allergy   . Anemia   . Anxiety   . Barrett esophagus   . Bladder pain   . Chronic bronchitis ([place])    per pt last episode july 2016  . Complication of anesthesia    slow to wake  . COPD (chronic obstructive pulmonary disease) ([place])   . Depression   . Diverticulosis   . Frequency of urination   . GERD (gastroesophageal reflux disease)   . Hiatal hernia   . History of adenomatous polyp of colon    tubular adenoma  . History of chronic gastritis   . History of diverticulitis of colon   . History of esophageal dilatation    for stricture  . History of esophageal spasm   . Hyperlipidemia   . Hypertension   . IBS (irritable bowel syndrome)   . Interstitial cystitis   . PAD (peripheral artery disease) ([place])   . Productive cough    intermittant  . Smokers' cough ([place])   . Urgency of urination    Past Surgical History:  Procedure Laterality Date  . ABDOMINAL AORTOGRAM N/A 10/14/2018   Procedure: ABDOMINAL AORTOGRAM;  Surgeon: Lorretta Harp, MD;  Location: [facility];  Service: Cardiovascular;  Laterality: N/A;  . ABDOMINAL HYSTERECTOMY  1975  . ANTERIOR CERVICAL DECOMP/DISCECTOMY FUSION N/A 09/07/2014   Procedure: Exploration of Fusion and Removal of Anterior Cervical Hematoma;  Surgeon: Newman Pies, MD;  Location: [facility] NEURO ORS;  Service: Neurosurgery;  Laterality: N/A;  . CARPAL TUNNEL RELEASE Right 1993  . COLONOSCOPY  last one 04-30-2013  . CYSTO WITH HYDRODISTENSION N/A 06/08/2015   Procedure: CYSTOSCOPY/HYDRODISTENSION INSTILLATION OF MARCAINE AND  PYRIDIUM;  Surgeon: Irine Seal, MD;  Location: [facility];  Service: Urology;  Laterality: N/A;  . CYSTO/  HOD/  BLADDER BX  1990's  . ESOPHAGOGASTRODUODENOSCOPY  last one 09-11-2012  . LAPAROSCOPIC APPENDECTOMY N/A 09/21/2013   Procedure: APPENDECTOMY LAPAROSCOPIC;  Surgeon: Rolm Bookbinder, MD;  Location: [facility];  Service: General;  Laterality: N/A;  . LAPAROSCOPIC CHOLECYSTECTOMY  2002  . LAPAROSCOPY SIGMOID COLECTOMY  10-05-2008   diverticulitis  . LOWER EXTREMITY ANGIOGRAPHY Left 10/14/2018  . LOWER EXTREMITY ANGIOGRAPHY Bilateral 10/14/2018   Procedure: Lower Extremity Angiography;  Surgeon: Lorretta Harp, MD;  Location: [facility];  Service: Cardiovascular;  Laterality: Bilateral;  ILIACS  . MICROLARYNGOSCOPY  09-27-2006   w/ true vocal cord stripping  and bilateral bx's of lesion's (benign)  . PERIPHERAL VASCULAR INTERVENTION  10/14/2018   Procedure: PERIPHERAL VASCULAR INTERVENTION;  Surgeon: Lorretta Harp, MD;  Location: [facility];  Service: Cardiovascular;;  . SINUS SURGERY WITH INSTATRAK  1991  . [place]  . TONSILLECTOMY AND ADENOIDECTOMY  1964     A IV Location/Drains/Wounds Patient Lines/Drains/Airways Status   Active Line/Drains/Airways    Name:   Placement date:   Placement time:   Site:   Days:   Peripheral IV   --    --    --      Peripheral IV 09/04/19 Left;Anterior Forearm   09/04/19    2255    Forearm   less than 1   Incision (Closed) 09/21/13 Abdomen Bilateral   09/21/13    1553     2174   Incision (Closed) 09/07/14 Neck Other (Comment)   09/07/14    2030     1823   Incision (Closed) 06/08/15 Perineum Other (Comment)   06/08/15    1202     1549   Incision - 3 Ports Abdomen 1: Umbilicus 2: Upper;Mid 3: Lower;Mid   09/21/13    1544     2174          Intake/Output Last 24 hours No intake or output data in the 24 hours ending 09/04/19 2328  Labs/Imaging Results for orders placed or performed  in visit on 09/04/19 (from the past 48 hour(s))  Comp Met (CMET)     Status: Abnormal   Collection Time: 09/04/19  2:38 PM  Result Value Ref Range   Sodium 137 135 - 145 mEq/L   Potassium 4.3 3.5 - 5.1 mEq/L   Chloride 102 96 - 112 mEq/L   CO2 29 19 - 32 mEq/L   Glucose, Bld 101 (H) 70 - 99 mg/dL   BUN 12 6 - 23 mg/dL   Creatinine, Ser 0.79 0.40 - 1.20 mg/dL   Total Bilirubin 0.3 0.2 - 1.2 mg/dL   Alkaline Phosphatase 73 39 - 117 U/L   AST 11 0 - 37 U/L   ALT 8 0 - 35 U/L   Total Protein 6.8 6.0 - 8.3 g/dL   Albumin 4.2 3.5 - 5.2 g/dL  GFR 71.69 >60.00 mL/min   Calcium 9.2 8.4 - 10.5 mg/dL  CBC with Differential/Platelet     Status: None   Collection Time: 09/04/19  2:38 PM  Result Value Ref Range   WBC 6.7 4.0 - 10.5 K/uL   RBC 4.55 3.87 - 5.11 Mil/uL   Hemoglobin 13.8 12.0 - 15.0 g/dL   HCT 42.1 36.0 - 46.0 %   MCV 92.6 78.0 - 100.0 fl   MCHC 32.9 30.0 - 36.0 g/dL   RDW 13.8 11.5 - 15.5 %   Platelets 251.0 150.0 - 400.0 K/uL   Neutrophils Relative % 63.2 43.0 - 77.0 %   Lymphocytes Relative 24.7 12.0 - 46.0 %   Monocytes Relative 7.3 3.0 - 12.0 %   Eosinophils Relative 4.0 0.0 - 5.0 %   Basophils Relative 0.8 0.0 - 3.0 %   Neutro Abs 4.2 1.4 - 7.7 K/uL   Lymphs Abs 1.6 0.7 - 4.0 K/uL   Monocytes Absolute 0.5 0.1 - 1.0 K/uL   Eosinophils Absolute 0.3 0.0 - 0.7 K/uL   Basophils Absolute 0.1 0.0 - 0.1 K/uL   CT Abdomen Pelvis W Contrast  Result Date: 09/04/2019 CLINICAL DATA:  Abdominal pain, chills. EXAM: CT ABDOMEN AND PELVIS WITH CONTRAST TECHNIQUE: Multidetector CT imaging of the abdomen and pelvis was performed using the standard protocol following bolus administration of intravenous contrast. CONTRAST:  18m OMNIPAQUE IOHEXOL 300 MG/ML  SOLN COMPARISON:  04/08/2016 FINDINGS: Lower chest: No acute abnormality. Hepatobiliary: Scattered liver cysts are identified. Previous cholecystectomy. No significant biliary dilatation. Pancreas: Pancreas divisum anatomy is  suspected. No main duct dilatation, inflammation or mass. Spleen: Spleen is unremarkable Adrenals/Urinary Tract: The adrenal glands are normal. No kidney mass or hydronephrosis. Urinary bladder is unremarkable. Stomach/Bowel: Stomach is unremarkable. Proximal small bowel loops appear normal within the lower abdomen and pelvis there are multiple loops of small bowel which are increased in caliber measuring up to 2.3 cm with air-fluid levels. Gradual transition to normal to decreased caliber distal small bowel noted. There is abnormal wall thickening with surrounding fat stranding involving the descending colon and sigmoid colon concerning for colitis. No pneumatosis or bowel perforation. Vascular/Lymphatic: Aortic atherosclerosis. No aneurysm. No abdominopelvic adenopathy. Reproductive: Status post hysterectomy. No adnexal masses. Other: Trace free fluid noted within the pelvis. Musculoskeletal: No acute or significant osseous findings. IMPRESSION: 1. Abnormal wall thickening and surrounding fat stranding involving the descending colon and sigmoid colon compatible with colitis. No pneumatosis or bowel perforation. 2. Mildly dilated loops of small bowel with air-fluid levels. Findings may reflect ileus or partial small bowel obstruction. 3. Pancreas divisum anatomy. Aortic Atherosclerosis (ICD10-I70.0). Electronically Signed   By: TKerby MoorsM.D.   On: 09/04/2019 17:36    Pending Labs Unresulted Labs (From admission, onward)    Start     Ordered   09/05/19 0500  Magnesium  Tomorrow morning,   R     09/04/19 2313   09/05/19 0500  Comprehensive metabolic panel  Tomorrow morning,   R     09/04/19 2313   09/05/19 0500  CBC WITH DIFFERENTIAL  Tomorrow morning,   R     09/04/19 2313   09/05/19 0500  TSH  Tomorrow morning,   R     09/04/19 2313   09/05/19 0500  Lipase, blood  Tomorrow morning,   R     09/04/19 2320   09/04/19 2315  Magnesium  Add-on,   AD     09/04/19 2314   09/04/19 2315  Urinalysis,  Routine w reflex microscopic  Once,   STAT     09/04/19 2314   09/04/19 2054  SARS CORONAVIRUS 2 (TAT 6-24 HRS) Nasopharyngeal Nasopharyngeal Swab  (Tier 3 (TAT 6-24 hrs))  Once,   STAT    Question Answer Comment  Is this test for diagnosis or screening Screening   Symptomatic for COVID-19 as defined by CDC No   Hospitalized for COVID-19 No   Admitted to ICU for COVID-19 No   Previously tested for COVID-19 Yes   Resident in a congregate (group) care setting No   Employed in healthcare setting No   Pregnant No      09/04/19 2053          Vitals/Pain Today's Vitals   09/04/19 1943 09/04/19 1955 09/04/19 2311  BP: 125/79  (!) 143/64  Pulse: 77  65  Resp: 16  15  Temp: 98.3 F (36.8 C)  98.3 F (36.8 C)  TempSrc: Oral  Oral  SpO2: 99%  100%  PainSc:  8  0-No pain    Isolation Precautions No active isolations  Medications Medications  sodium chloride 0.9 % bolus 1,000 mL (1,000 mLs Intravenous New Bag/Given 09/04/19 2313)    Followed by  0.9 %  sodium chloride infusion (1,000 mLs Intravenous New Bag/Given 09/04/19 2314)  metroNIDAZOLE (FLAGYL) IVPB 500 mg (0 mg Intravenous Hold 09/04/19 2326)  aztreonam (AZACTAM) 2 g in sodium chloride 0.9 % 100 mL IVPB (2 g Intravenous New Bag/Given 09/04/19 2316)  estradiol (CLIMARA - Dosed in mg/24 hr) patch 0.1 mg (has no administration in time range)  sodium chloride flush (NS) 0.9 % injection 3 mL (3 mLs Intravenous Given 09/04/19 2316)  acetaminophen (TYLENOL) tablet 650 mg (has no administration in time range)    Or  acetaminophen (TYLENOL) suppository 650 mg (has no administration in time range)  ondansetron (ZOFRAN) injection 4 mg (has no administration in time range)  morphine 2 MG/ML injection 2 mg (has no administration in time range)  nicotine (NICODERM CQ - dosed in mg/24 hours) patch 14 mg (has no administration in time range)  LORazepam (ATIVAN) injection 0.5 mg (has no administration in time range)  pantoprazole (PROTONIX)  injection 40 mg (has no administration in time range)  morphine 4 MG/ML injection 4 mg (4 mg Intravenous Given 09/04/19 2311)    Mobility walks Low fall risk   Focused Assessment Gi focus with for obstruction    R Recommendations: See Admitting Provider Note  Report given to:   Additional Notes:

## 2019-09-04 NOTE — ED Triage Notes (Signed)
Pt sent here by PCP. Pt had a CT scan done today that confirmed a bowel obstruction. Pt reports feeling progressively worse over the last week with N/V and abdominal pain and distention.

## 2019-09-04 NOTE — Progress Notes (Signed)
Chief Complaint: Abdominal pain, change in bowel habits, nausea/vomiting  HPI:    Leah [name] is a 74 year old Caucasian female with a past medical history as listed below including COPD, known to Dr. Loletha Carrow, who presents clinic today with a complaint of abdominal pain, change in bowel habits, nausea and vomiting.      05/31/2018 colonoscopy with poor preparation of the colon, decreased sphincter tone, stool in the entire colon, patent end-to-end colocolonic anastomosis characterized by healthy-appearing mucosa, otherwise normal, slight constipation appear to be a motility problem at least partially related to prior sigmoid colon resection and anatomy of the surgical anastomosis, patient told to repeat colonoscopy in 1 year for surveillance with a 2-day prep due to adenomatous polyps 04/2013 and was started on Linzess 104 mcg p.o. daily for 2 weeks.     08/13/2018 EGD with a 3 cm hiatal hernia, benign-appearing esophageal stenosis which was dilated and multiple fundic gland polyps.     08/25/2019 patient called in complaining of constipation regardless of MiraLAX.  Recommend she take 2 Dulcolax tablets and mix MiraLAX bottle and 2 L of Gatorade.    Today, the patient presents to clinic and tells me that she is just "hurting terribly" in her lower abdomen.  Tells me this is some worse in her left lower quadrant.  It is rated as a 9-10/10 and is constant, moving around makes it worse.  Tells me that she did the MiraLAX purge after only having a small bowel movement for a few days, and now tells me that she will have a thin stool with a lot of "watery stool around it".  Sometimes she can have this happen every hour or so.  She has had a decreased sense of hunger over the past 3 days and has been nauseous.  When trying to eat 2 days ago she vomited both times and yesterday just once.  Due to this patient has really stopped eating much at all.  Associated symptoms include chills but no documented fever.   Currently not on any laxatives due to having "diarrhea".    Social history positive for taking care of her husband who was diagnosed with liver cancer.    Denies fever, weight loss or blood in her stool.  Past Medical History:  Diagnosis Date  . Allergy   . Anemia   . Anxiety   . Barrett esophagus   . Bladder pain   . Chronic bronchitis ([place])    per pt last episode july 2016  . Complication of anesthesia    slow to wake  . COPD (chronic obstructive pulmonary disease) ([place])   . Depression   . Diverticulosis   . Frequency of urination   . GERD (gastroesophageal reflux disease)   . Hiatal hernia   . History of adenomatous polyp of colon    tubular adenoma  . History of chronic gastritis   . History of diverticulitis of colon   . History of esophageal dilatation    for stricture  . History of esophageal spasm   . Hyperlipidemia   . Hypertension   . IBS (irritable bowel syndrome)   . Interstitial cystitis   . PAD (peripheral artery disease) ([place])   . Productive cough    intermittant  . Smokers' cough ([place])   . Urgency of urination     Past Surgical History:  Procedure Laterality Date  . ABDOMINAL AORTOGRAM N/A 10/14/2018   Procedure: ABDOMINAL AORTOGRAM;  Surgeon: Lorretta Harp, MD;  Location: [place]  [facility];  Service: Cardiovascular;  Laterality: N/A;  . ABDOMINAL HYSTERECTOMY  1975  . ANTERIOR CERVICAL DECOMP/DISCECTOMY FUSION N/A 09/07/2014   Procedure: Exploration of Fusion and Removal of Anterior Cervical Hematoma;  Surgeon: Newman Pies, MD;  Location: [facility]UR[facility];  Service: Neurosurgery;  Laterality: N/A;  . CARPAL TUNNEL RELEASE Right 1993  . COLONOSCOPY  last one 04-30-2013  . CYSTO WITH HYDRODISTENSION N/A 06/08/2015   Procedure: CYSTOSCOPY/HYDRODISTENSION INSTILLATION OF MARCAINE AND PYRIDIUM;  Surgeon: Irine Seal, MD;  Location: [facility];  Service: Urology;  Laterality: N/A;  . CYSTO/  HOD/  BLADDER BX  1990's  .  ESOPHAGOGASTRODUODENOSCOPY  last one 09-11-2012  . LAPAROSCOPIC APPENDECTOMY N/A 09/21/2013   Procedure: APPENDECTOMY LAPAROSCOPIC;  Surgeon: Rolm Bookbinder, MD;  Location: [place];  Service: General;  Laterality: N/A;  . LAPAROSCOPIC CHOLECYSTECTOMY  2002  . LAPAROSCOPY SIGMOID COLECTOMY  10-05-2008   diverticulitis  . LOWER EXTREMITY ANGIOGRAPHY Left 10/14/2018  . LOWER EXTREMITY ANGIOGRAPHY Bilateral 10/14/2018   Procedure: Lower Extremity Angiography;  Surgeon: Lorretta Harp, MD;  Location: [place] [facility];  Service: Cardiovascular;  Laterality: Bilateral;  ILIACS  . MICROLARYNGOSCOPY  09-27-2006   w/ true vocal cord stripping  and bilateral bx's of lesion's (benign)  . PERIPHERAL VASCULAR INTERVENTION  10/14/2018   Procedure: PERIPHERAL VASCULAR INTERVENTION;  Surgeon: Lorretta Harp, MD;  Location: [place] [facility];  Service: Cardiovascular;;  . SINUS SURGERY WITH INSTATRAK  1991  . [place]  . TONSILLECTOMY AND ADENOIDECTOMY  1964    Current Outpatient Medications  Medication Sig Dispense Refill  . aspirin EC 81 MG EC tablet Take 1 tablet (81 mg total) by mouth daily.    Marland Kitchen atorvastatin (LIPITOR) 80 MG tablet TAKE 1 TABLET (80 MG TOTAL) BY MOUTH DAILY. 90 tablet 0  . clopidogrel (PLAVIX) 75 MG tablet TAKE 1 TABLET BY MOUTH EVERY DAY 90 tablet 3  . dicyclomine (BENTYL) 10 MG capsule Take 1 capsule (10 mg total) by mouth 2 (two) times daily as needed for spasms. 60 capsule 2  . doxycycline (VIBRA-TABS) 100 MG tablet Take 1 tablet (100 mg total) by mouth 2 (two) times daily. 15 tablet 0  . esomeprazole (NEXIUM) 40 MG capsule Take 1 capsule (40 mg total) by mouth 2 (two) times daily before a meal. 180 capsule 0  . estradiol (CLIMARA - DOSED IN MG/24 HR) 0.1 mg/24hr patch Place 0.1 mg onto the skin every Monday.  0  . ezetimibe (ZETIA) 10 MG tablet Take 1 tablet (10 mg total) by mouth daily. 90 tablet 1  . famotidine (PEPCID) 20 MG tablet TAKE 1  TABLET TWICE DAILY 180 tablet 0  . levocetirizine (XYZAL) 5 MG tablet Take 1 tablet (5 mg total) by mouth every evening. 30 tablet 5  . linaclotide (LINZESS) 145 MCG CAPS capsule Take 1 capsule (145 mcg total) by mouth daily. 14 capsule 1  . lisinopril (ZESTRIL) 20 MG tablet TAKE 1 TABLET BY MOUTH EVERY DAY 90 tablet 1  . LORazepam (ATIVAN) 0.5 MG tablet Take 0.5 mg by mouth at bedtime.   5  . REPATHA SURECLICK XX123456 MG/ML SOAJ INJECT 1 DOSE INTO THE SKIN EVERY 14 (FOURTEEN) DAYS. 2 pen 11  . sertraline (ZOLOFT) 50 MG tablet Take 50 mg by mouth at bedtime.    . sodium chloride (OCEAN) 0.65 % SOLN nasal spray Place 1 spray into both nostrils daily as needed for congestion.    Marland Kitchen tiZANidine (ZANAFLEX) 4  MG tablet Take 1 tablet (4 mg total) by mouth every 6 (six) hours as needed for muscle spasms. 30 tablet 0   No current facility-administered medications for this visit.   Facility-Administered Medications Ordered in Other Visits  Medication Dose Route Frequency Provider Last Rate Last Admin  . triamcinolone acetonide (KENALOG-40) injection (RADIOLOGY ONLY) 60 mg  60 mg Epidural Once Viona Gilmore D, NP        Allergies as of 09/04/2019 - Review Complete 09/02/2019  Allergen Reaction Noted  . Amoxicillin-pot clavulanate Shortness Of Breath, Itching, and Swelling 09/19/2013  . Avelox [moxifloxacin hcl in nacl] Swelling 06/08/2015  . Ciprofloxacin Shortness Of Breath, Itching, and Swelling 03/12/2008  . Keflex [cephalexin] Shortness Of Breath and Swelling 09/08/2014  . Penicillins Shortness Of Breath, Itching, and Swelling 03/12/2008  . Sulfa antibiotics Shortness Of Breath, Itching, and Swelling 06/04/2015  . Prednisone Other (See Comments) 01/15/2013  . Acyclovir and related Other (See Comments) 04/13/2015  . Clindamycin/lincomycin Itching and Swelling 03/29/2018  . Levaquin [levofloxacin] Hives 10/14/2018  . Zyvox [linezolid] Diarrhea 04/02/2018    Family History  Problem Relation Age  of Onset  . Pancreatic cancer Mother   . Colon polyps Mother   . Pancreatic cancer Father   . Diabetes Father   . Heart disease Father   . Leukemia Father   . Prostate cancer Father   . Hypertension Father   . Colon cancer Neg Hx   . Esophageal cancer Neg Hx   . Rectal cancer Neg Hx   . Stomach cancer Neg Hx     Social History   Socioeconomic History  . Marital status: Married    Spouse name: Not on file  . Number of children: Not on file  . Years of education: Not on file  . Highest education level: Not on file  Occupational History  . Not on file  Tobacco Use  . Smoking status: Current Every Day Smoker    Packs/day: 0.75    Years: 47.00    Pack years: 35.25    Types: Cigarettes  . Smokeless tobacco: Never Used  . Tobacco comment: counseled about smoking cessation  Substance and Sexual Activity  . Alcohol use: No    Alcohol/week: 0.0 standard drinks  . Drug use: No  . Sexual activity: Not Currently    Birth control/protection: Post-menopausal  Other Topics Concern  . Not on file  Social History Narrative  . Not on file   Social Determinants of Health   Financial Resource Strain:   . Difficulty of Paying Living Expenses: Not on file  Food Insecurity:   . Worried About Charity fundraiser in the Last Year: Not on file  . Ran Out of Food in the Last Year: Not on file  Transportation Needs:   . Lack of Transportation (Medical): Not on file  . Lack of Transportation (Non-Medical): Not on file  Physical Activity:   . Days of Exercise per Week: Not on file  . Minutes of Exercise per Session: Not on file  Stress:   . Feeling of Stress : Not on file  Social Connections:   . Frequency of Communication with Friends and Family: Not on file  . Frequency of Social Gatherings with Friends and Family: Not on file  . Attends Religious Services: Not on file  . Active Member of Clubs or Organizations: Not on file  . Attends Archivist Meetings: Not on file  .  Marital Status: Not on file  Intimate  Partner Violence:   . Fear of Current or Ex-Partner: Not on file  . Emotionally Abused: Not on file  . Physically Abused: Not on file  . Sexually Abused: Not on file    Review of Systems:    Constitutional: No weight loss Cardiovascular: No chest pain Respiratory: No SOB Gastrointestinal: See HPI and otherwise negative   Physical Exam:  Vital signs: BP 126/62   Pulse 88   Temp 97.8 F (36.6 C)   Ht 5\' 1"  (1.549 m)   Wt 110 lb 12.8 oz (50.3 kg)   BMI 20.94 kg/m   Constitutional:   Pleasant ill appearing Caucasian female, Well developed, Well nourished, alert and cooperative, Clutching her abdomen Head:  Normocephalic and atraumatic. Eyes:   PEERL, EOMI. No icterus. Conjunctiva pink. Ears:  Normal auditory acuity. Neck:  Supple Throat: Oral cavity and pharynx without inflammation, swelling or lesion.  Respiratory: Respirations even and unlabored. Lungs clear to auscultation bilaterally.   No wheezes, crackles, or rhonchi.  Cardiovascular: Normal S1, S2. No MRG. Regular rate and rhythm. No peripheral edema, cyanosis or pallor.  Gastrointestinal:  Soft, mild distention, marked TTP generalized, much worse in the left lower quadrant, patient winced to light percussion over this area with involuntary guarding, maybe slightly tinkling. No appreciable masses or hepatomegaly. Rectal:  Not performed.  Msk:  Symmetrical without gross deformities. Without edema, no deformity or joint abnormality.  Neurologic:  Alert and  oriented x4;  grossly normal neurologically.  Skin:   Dry and intact without significant lesions or rashes. Psychiatric:  Demonstrates good judgement and reason without abnormal affect or behaviors.  No recent labs/imaging.  Assessment: 1.  Abdominal pain: Severe at time of exam today, with change in bowel habits and recent nausea and vomiting and chills, as well as known history of sigmoid resection concern about obstruction versus  partial obstruction versus perforation versus diverticulitis 2.  Nausea and vomiting 3.  Change in bowel habits  Plan: 1.  Due to severity of pain with percussion over the left lower quadrant there is concern about perforation versus obstruction versus diverticulitis.  Ordered stat CT abdomen and pelvis with contrast. 2.  Ordered labs to include a CBC and CMP 3.  After labs and imaging above will give further recommendations for the patient. 4.  Did discuss that she is due for repeat colonoscopy given that her last one had very poor bowel prep.  This would need to take place with Dr. Loletha Carrow in the [facility] with a 2-day bowel prep.  Patient would also benefit from Reglan to help with nausea.  Tells me why she does not prep well is because she vomits it. If labs/imaging non-conclusive then recommend colonoscopy.  Ellouise Newer, PA-C [facility] 09/04/2019, 2:01 PM  Cc: Carollee Herter, Alferd Apa, *

## 2019-09-04 NOTE — Telephone Encounter (Signed)
Feb 24th appt with me can be canceled.  I will discuss case with JL later today.  - HD

## 2019-09-04 NOTE — Progress Notes (Signed)
____________________________________________________________  Attending physician addendum:  Thank you for sending this case to me. I have reviewed the entire note, and the outlined plan seems appropriate.  I REVIEWED HER CT SCAN AND LABS THIS EVENING.  SHE HAS A SMALL BOWEL OBSTRUCTION.  I WAS UNABLE TO REACH HER ON HOME NUMBER, GOT THE VOICEMAIL ON HER MOBILE NUMBER AND INSTRUCTED HER TO GO TO EMERGENCY DEPARTMENT AT [place].  I WILL ALERT OUR ON CALL PHYSICIAN THIS EVENING AND HAVE THEM TRY TO REACH HER.  PLEASE HAVE NURSING CALL HER IN AM TO BE SURE SHE RECEIVED THE MESSAGE.  Wilfrid Lund, MD  ____________________________________________________________

## 2019-09-04 NOTE — H&P (Signed)
History and Physical    PLEASE NOTE THAT DRAGON DICTATION SOFTWARE WAS USED IN THE CONSTRUCTION OF THIS NOTE.   Leah [name] Tennis [id number] DOB: 1946-07-11 DOA: 09/04/2019  PCP: [name], DO Patient coming from: home   I have personally briefly reviewed patient's old medical records in [place]  Chief Complaint: Abdominal pain  HPI: Leah [name] is a 74 y.o. female with medical history significant for small bowel obstruction, peripheral artery disease, hypertension, hyperlipidemia, diverticulitis, chronic gastritis, chronic constipation, GERD, who is admitted to [facility] on 09/04/2019 with suspected small bowel obstruction after presenting from home to [facility] Emergency Department complaining of abdominal pain.   The patient reports 1 week of progressive abdominal discomfort, which she describes as sharp discomfort in diffuse abdominal distribution, and made worse with palpation of the abdomen.  She reports associated decline in frequency of bowel movements and flatus production over the last week.  Specifically, she reports that she had not had a bowel movement over the preceding 4 days leading up to today's admission until she experienced to episodes of loose stool earlier today.  Denies any associated melena or hematochezia.  She reports that the distribution and intensity of her diffuse abdominal pain was unchanged at the time of these bowel movements.  Following these 2 episodes of loose stool, the patient reports no subsequent bowel movements and also conveys no additional flatus production following these episodes.  In assessing for any recent antibiotic use, the patient reports that she completed a course of doxycycline in December 2020 for sinusitis. she denies any associated nausea or vomiting.  Denies any recent trauma.  Denies any subjective fever, chills, rigors, or generalized myalgias.   Denies any recent headache, neck  stiffness, rhinitis, rhinorrhea, sore throat, sob, cough, or rash. No recent traveling or known COVID-19 exposures. Denies dysuria, gross hematuria, or change in urinary urgency/frequency.  She reports a previous hospitalization approximately 5 years ago for small bowel obstruction, which reportedly resolved spontaneously via conservative measures.  She has a history of multiple prior abdominal surgeries, including open abdominal hysterectomy in 1975, laparoscopic sigmoid colectomy with reanastomosis in the setting of diverticulitis, laparoscopic cholecystectomy, and laparoscopic appendectomy.  Denies any known history of inflammatory bowel disease.    In the setting of her history of multiple gastrointestinal pathologies, including chronic gastritis, chronic constipation, diverticulitis status post laparoscopic sigmoid colectomy with reanastomosis, patient reports that she closely follows with outpatient gastroenterology.  After contacting her outpatient gastroenterologist to inform of recent diffuse abdominal discomfort, and an outpatient CT of the abdomen performed earlier today, with the patient subsequently received a call from her gastroenterologist directing her to present to the emergency department for further evaluation of the associated findings.   Specifically, CT abdomen/pelvis with contrast performed on 09/04/2019 showed dilated loops of small bowel with air-fluid levels suggestive of partial small bowel obstruction versus ileus.  CT abdomen/pelvis also showed abnormal wall thickening and surrounding fat stranding involving the descending colon and sigmoid colon consistent with colitis in the absence of any evidence of perforation or abscess.  No mention of watershed distribution was made in radiology report.    ED Course:  Vital signs in the ED were notable for the following: Temperature max 98.3; heart rate 77; blood pressure 125/79; respiratory rate 16, And oxygen saturation 99% on room  air.  Labs were notable for the following: CMP notable for sodium 137, potassium 4.4, bicarbonate 29, creatinine 0.79, and liver enzymes were  found to be within normal limits.  CBC notable for white blood cell count of 6700 with 63% neutrophils, hemoglobin 13.8.  Nasopharyngeal COVID-19 PCR was obtained to the emergency department this evening, with result currently pending.   While in the ED, the following were administered: Morphine 4 mg IV x1, a 1 L IV normal saline bolus.  Additionally, the emergency department physician ordered one-time doses of aztreonam as well as IV Flagyl to cover for any contribution from bacterial infectious colitis, with the specific antibiotics on the basis limited availability of antimicrobial classes due to extensive allergy list.  Subsequently, the patient was admitted to the [place] unit at [facility] for further evaluation and management of suspected presenting partial small bowel obstruction as well as potential colitis.     Review of Systems: As per HPI otherwise 10 point review of systems negative.   Past Medical History:  Diagnosis Date  . Allergy   . Anemia   . Anxiety   . Barrett esophagus   . Bladder pain   . Chronic bronchitis ([place])    per pt last episode july 2016  . Complication of anesthesia    slow to wake  . COPD (chronic obstructive pulmonary disease) ([place])   . Depression   . Diverticulosis   . Frequency of urination   . GERD (gastroesophageal reflux disease)   . Hiatal hernia   . History of adenomatous polyp of colon    tubular adenoma  . History of chronic gastritis   . History of diverticulitis of colon   . History of esophageal dilatation    for stricture  . History of esophageal spasm   . Hyperlipidemia   . Hypertension   . IBS (irritable bowel syndrome)   . Interstitial cystitis   . PAD (peripheral artery disease) ([place])   . Productive cough    intermittant  . Smokers' cough ([place])   . Urgency of urination      Past Surgical History:  Procedure Laterality Date  . ABDOMINAL AORTOGRAM N/A 10/14/2018   Procedure: ABDOMINAL AORTOGRAM;  Surgeon: Lorretta Harp, MD;  Location: [place] CV LAB;  Service: Cardiovascular;  Laterality: N/A;  . ABDOMINAL HYSTERECTOMY  1975  . ANTERIOR CERVICAL DECOMP/DISCECTOMY FUSION N/A 09/07/2014   Procedure: Exploration of Fusion and Removal of Anterior Cervical Hematoma;  Surgeon: Newman Pies, MD;  Location: [facility] NEURO ORS;  Service: Neurosurgery;  Laterality: N/A;  . CARPAL TUNNEL RELEASE Right 1993  . COLONOSCOPY  last one 04-30-2013  . CYSTO WITH HYDRODISTENSION N/A 06/08/2015   Procedure: CYSTOSCOPY/HYDRODISTENSION INSTILLATION OF MARCAINE AND PYRIDIUM;  Surgeon: Irine Seal, MD;  Location: [facility];  Service: Urology;  Laterality: N/A;  . CYSTO/  HOD/  BLADDER BX  1990's  . ESOPHAGOGASTRODUODENOSCOPY  last one 09-11-2012  . LAPAROSCOPIC APPENDECTOMY N/A 09/21/2013   Procedure: APPENDECTOMY LAPAROSCOPIC;  Surgeon: Rolm Bookbinder, MD;  Location: [place];  Service: General;  Laterality: N/A;  . LAPAROSCOPIC CHOLECYSTECTOMY  2002  . LAPAROSCOPY SIGMOID COLECTOMY  10-05-2008   diverticulitis  . LOWER EXTREMITY ANGIOGRAPHY Left 10/14/2018  . LOWER EXTREMITY ANGIOGRAPHY Bilateral 10/14/2018   Procedure: Lower Extremity Angiography;  Surgeon: Lorretta Harp, MD;  Location: [place] CV LAB;  Service: Cardiovascular;  Laterality: Bilateral;  ILIACS  . MICROLARYNGOSCOPY  09-27-2006   w/ true vocal cord stripping  and bilateral bx's of lesion's (benign)  . PERIPHERAL VASCULAR INTERVENTION  10/14/2018   Procedure: PERIPHERAL VASCULAR INTERVENTION;  Surgeon: Lorretta Harp, MD;  Location: [place] CV LAB;  Service: Cardiovascular;;  . SINUS SURGERY WITH INSTATRAK  1991  . [place]  . TONSILLECTOMY AND ADENOIDECTOMY  1964    Social History:  reports that she has been smoking cigarettes. She has a 35.25  pack-year smoking history. She has never used smokeless tobacco. She reports that she does not drink alcohol or use drugs.   Allergies  Allergen Reactions  . Amoxicillin-Pot Clavulanate Shortness Of Breath, Itching and Swelling  . Avelox [Moxifloxacin Hcl In Nacl] Swelling    Swelling, itching and shortness of breath  . Ciprofloxacin Shortness Of Breath, Itching and Swelling  . Keflex [Cephalexin] Shortness Of Breath and Swelling  . Penicillins Shortness Of Breath, Itching and Swelling    Did it involve swelling of the face/tongue/throat, SOB, or low BP? Yes Did it involve sudden or severe rash/hives, skin peeling, or any reaction on the inside of your mouth or nose? No Did you need to seek medical attention at a hospital or doctor's office? Yes When did it last happen?Several years If all above answers are "NO", may proceed with cephalosporin use.   . Sulfa Antibiotics Shortness Of Breath, Itching and Swelling  . Prednisone Other (See Comments)    GI irritation; is able to tolerate injections  . Acyclovir And Related Other (See Comments)    Pt does not recall this reaction  . Clindamycin/Lincomycin Itching and Swelling  . Levaquin [Levofloxacin] Hives  . Zyvox [Linezolid] Diarrhea    Family History  Problem Relation Age of Onset  . Pancreatic cancer Mother   . Colon polyps Mother   . Pancreatic cancer Father   . Diabetes Father   . Heart disease Father   . Leukemia Father   . Prostate cancer Father   . Hypertension Father   . Colon cancer Neg Hx   . Esophageal cancer Neg Hx   . Rectal cancer Neg Hx   . Stomach cancer Neg Hx      Prior to Admission medications   Medication Sig Start Date End Date Taking? Authorizing Provider  aspirin EC 81 MG EC tablet Take 1 tablet (81 mg total) by mouth daily. 10/15/18   Cheryln Manly, NP  atorvastatin (LIPITOR) 80 MG tablet TAKE 1 TABLET (80 MG TOTAL) BY MOUTH DAILY. 05/29/19   Roma Schanz R, DO  clopidogrel  (PLAVIX) 75 MG tablet TAKE 1 TABLET BY MOUTH EVERY DAY 04/18/19   Hilty, Nadean Corwin, MD  dicyclomine (BENTYL) 10 MG capsule Take 1 capsule (10 mg total) by mouth 2 (two) times daily as needed for spasms. 04/11/19   Doran Stabler, MD  estradiol (CLIMARA - DOSED IN MG/24 HR) 0.1 mg/24hr patch Place 0.1 mg onto the skin every Monday. 02/08/16   [provider]  ezetimibe (ZETIA) 10 MG tablet Take 1 tablet (10 mg total) by mouth daily. 04/17/19   Roma Schanz R, DO  famotidine (PEPCID) 20 MG tablet TAKE 1 TABLET TWICE DAILY 06/02/19   Carollee Herter, Yvonne R, DO  lisinopril (ZESTRIL) 20 MG tablet TAKE 1 TABLET BY MOUTH EVERY DAY 06/19/19   Carollee Herter, Alferd Apa, DO  LORazepam (ATIVAN) 0.5 MG tablet Take 0.5 mg by mouth at bedtime.  12/04/14   [provider]  REPATHA SURECLICK [id number] MG/ML SOAJ INJECT 1 DOSE INTO THE SKIN EVERY 14 (FOURTEEN) DAYS. 09/04/19   Hilty, Nadean Corwin, MD  sertraline (ZOLOFT) 50 MG tablet Take 50 mg by mouth at bedtime.  [provider]     Objective    Physical Exam: Vitals:   09/04/19 1943  BP: 125/79  Pulse: 77  Resp: 16  Temp: 98.3 F (36.8 C)  TempSrc: Oral  SpO2: 99%    General: appears to be stated age; alert, oriented Skin: warm, dry, no rash Head:  AT/[place] Eyes:  PEARL b/l, EOMI Mouth:  Oral mucosa membranes appear dry, normal dentition Neck: supple; trachea midline Heart:  RRR; did not appreciate any M/R/G Lungs: CTAB, did not appreciate any wheezes, rales, or rhonchi Abdomen: Hypoactive BS; soft, ND; tenderness to palpation throughout all 4 abdominal quadrants, with most pronounced discomfort in the bilateral lower abdominal quadrants in the absence of any associated guarding, rigidity, or rebound tenderness. Vascular: 2+ pedal pulses b/l; 2+ radial pulses b/l Extremities: no peripheral edema, no muscle wasting  Labs on Admission: I have personally reviewed following labs and imaging studies  CBC: Recent Labs  Lab  09/04/19 1438  WBC 6.7  NEUTROABS 4.2  HGB 13.8  HCT 42.1  MCV 92.6  PLT [id number]   Basic Metabolic Panel: Recent Labs  Lab 09/04/19 1438  NA 137  K 4.3  CL 102  CO2 29  GLUCOSE 101*  BUN 12  CREATININE 0.79  CALCIUM 9.2   GFR: Estimated Creatinine Clearance: 48.7 mL/min (by C-G formula based on SCr of 0.79 mg/dL). Liver Function Tests: Recent Labs  Lab 09/04/19 1438  AST 11  ALT 8  ALKPHOS 73  BILITOT 0.3  PROT 6.8  ALBUMIN 4.2   No results for input(s): LIPASE, AMYLASE in the last 168 hours. No results for input(s): AMMONIA in the last 168 hours. Coagulation Profile: No results for input(s): INR, PROTIME in the last 168 hours. Cardiac Enzymes: No results for input(s): CKTOTAL, CKMB, CKMBINDEX, TROPONINI in the last 168 hours. BNP (last 3 results) No results for input(s): PROBNP in the last 8760 hours. HbA1C: No results for input(s): HGBA1C in the last 72 hours. CBG: No results for input(s): GLUCAP in the last 168 hours. Lipid Profile: No results for input(s): CHOL, HDL, LDLCALC, TRIG, CHOLHDL, LDLDIRECT in the last 72 hours. Thyroid Function Tests: No results for input(s): TSH, T4TOTAL, FREET4, T3FREE, THYROIDAB in the last 72 hours. Anemia Panel: No results for input(s): VITAMINB12, FOLATE, FERRITIN, TIBC, IRON, RETICCTPCT in the last 72 hours. Urine analysis:    Component Value Date/Time   COLORURINE YELLOW 04/07/2016 2109   APPEARANCEUR CLOUDY (A) 04/07/2016 2109   LABSPEC 1.024 04/07/2016 2109   PHURINE 5.5 04/07/2016 2109   GLUCOSEU NEGATIVE 04/07/2016 2109   HGBUR NEGATIVE 04/07/2016 2109   BILIRUBINUR negative 10/08/2017 1143   KETONESUR NEGATIVE 04/07/2016 2109   PROTEINUR negative 10/08/2017 1143   PROTEINUR NEGATIVE 04/07/2016 2109   UROBILINOGEN 0.2 10/08/2017 1143   UROBILINOGEN 0.2 09/19/2013 1627   NITRITE negative 10/08/2017 1143   NITRITE NEGATIVE 04/07/2016 2109   LEUKOCYTESUR Negative 10/08/2017 1143    Radiological Exams on  Admission: CT Abdomen Pelvis W Contrast  Result Date: 09/04/2019 CLINICAL DATA:  Abdominal pain, chills. EXAM: CT ABDOMEN AND PELVIS WITH CONTRAST TECHNIQUE: Multidetector CT imaging of the abdomen and pelvis was performed using the standard protocol following bolus administration of intravenous contrast. CONTRAST:  194mL OMNIPAQUE IOHEXOL 300 MG/ML  SOLN COMPARISON:  04/08/2016 FINDINGS: Lower chest: No acute abnormality. Hepatobiliary: Scattered liver cysts are identified. Previous cholecystectomy. No significant biliary dilatation. Pancreas: Pancreas divisum anatomy is suspected. No main duct dilatation, inflammation or mass. Spleen: Spleen is unremarkable Adrenals/Urinary Tract:  The adrenal glands are normal. No kidney mass or hydronephrosis. Urinary bladder is unremarkable. Stomach/Bowel: Stomach is unremarkable. Proximal small bowel loops appear normal within the lower abdomen and pelvis there are multiple loops of small bowel which are increased in caliber measuring up to 2.3 cm with air-fluid levels. Gradual transition to normal to decreased caliber distal small bowel noted. There is abnormal wall thickening with surrounding fat stranding involving the descending colon and sigmoid colon concerning for colitis. No pneumatosis or bowel perforation. Vascular/Lymphatic: Aortic atherosclerosis. No aneurysm. No abdominopelvic adenopathy. Reproductive: Status post hysterectomy. No adnexal masses. Other: Trace free fluid noted within the pelvis. Musculoskeletal: No acute or significant osseous findings. IMPRESSION: 1. Abnormal wall thickening and surrounding fat stranding involving the descending colon and sigmoid colon compatible with colitis. No pneumatosis or bowel perforation. 2. Mildly dilated loops of small bowel with air-fluid levels. Findings may reflect ileus or partial small bowel obstruction. 3. Pancreas divisum anatomy. Aortic Atherosclerosis (ICD10-I70.0). Electronically Signed   By: Kerby Moors  M.D.   On: 09/04/2019 17:36    Assessment/Plan   Leah [name] is a 74 y.o. female with medical history significant for small bowel obstruction, peripheral artery disease, hypertension, hyperlipidemia, diverticulitis, chronic gastritis, chronic constipation, GERD, who is admitted to [facility] on 09/04/2019 with suspected small bowel obstruction after presenting from home to [facility] Emergency Department complaining of abdominal pain.    Principal Problem:   SBO (small bowel obstruction) (HCC) Active Problems:   HLD (hyperlipidemia)   GERD (gastroesophageal reflux disease)   Abdominal pain   HTN (hypertension)   Colitis   #) Partial mechanical small bowel obstruction: Diagnosis suspected on the basis of 1 week of progressive abdominal discomfort associated with diminished stool output and absent flatus production, with CT abdomen/pelvis showing evidence of dilated loops of small bowel with air-fluid levels consistent with partial small bowel obstruction, in the absence of any evidence of colonic perforation or abscess.  In the context of a history of multiple prior abdominal surgeries, suspect that that is a mechanical small bowel obstruction, most likely stemming from associated postoperative adhesions.  Of note, the patient reports a history of 1 prior episode of small bowel obstruction approximately 5 years ago for which the patient was hospitalized and treated with conservative measures, with resultant spontaneous resolution of the SBO. Will initiate conservative measures, including symptomatic management, and observe for return of normal bowel function, as described below.  Discussed with the patient the possibility of initiating an NG tube as better treatment for small bowel objection, although she flatly refused this option at the present time, but conveyed that she would likely be amenable for subsequent NG tube placement if her symptoms have not improved by the  morning.  Plan: Strict NPO. LR @ 125 cc per hour. Prn IV Morphine. Prn IV Zofran.  SCD's for DVT prophylaxis in case the patient should ultimately require surgical intervention.  Recheck CMP and CBC in the morning. Consider placement of NG tube attached to low intermittent wall suction if symptoms do not improve.       #) Colitis: Potential element of colitis involving the descending colon and sigmoid colon in the absence of any associated bowel perforation or abscess per radiology read of today CT abdomen/pelvis.  Etiology of such is not completely clear.  Radiology report does not mention any watershed distribution, while the patient denies any hematochezia associated with the 2 episodes of loose stool that she experienced earlier today, thereby decreasing  the likelihood of ischemic colitis.  Infectious colitis is a possibility, although this could be viral in nature.  Will refrain from administration of additional IV antibiotics at this time, and consider stool studies/stool sample once patient's bowel function has returned.  Differential also includes inflammatory bowel disease, although it is notable that the patient does not possess any history of such.  Overall inconsistent with approach for presenting suspected small bowel stricturing, will proceed with conservative measures, bowel rest, and symptomatic management, and attempt additional chart review of patient's gastroenterology history provided additional to this current presentation.  Does not appear septic, and physical exam demonstrates a nonsurgical abdomen.  Plan: NPO.  IV fluids as above.  As needed IV morphine as above.  Repeat CMP in the morning. Will attempt additional chart review, as above.  Check urinalysis.  Repeat CBC with differential in the morning.  Will follow for result of nasopharyngeal COVID-19 PCR obtained in the emergency department this evening, with result currently pending.      #) Peripheral artery disease: The  patient has a documented history of peripheral artery disease, and the patient reports that she underwent stenting procedure to the left lower extremity sometime in the last year following which she has been on dual antiplatelet therapy with aspirin and Plavix.  Of note, this history regarding surgical intervention with stent placement in the left lower extremity was provided by the patient, and not evaluated via enhanced chart review.  The patient reports that she took both her aspirin and Plavix earlier today.  Plan: Additional chart review to determine the timing and nature of her endothelial intervention.  In the setting of current n.p.o. status, will hold aspirin and Plavix, pending chart review, as above.      #) Essential hypertension: Outpatient antihypertensive regimen appears to be limited to lisinopril.  Blood pressure since presenting to the ED this evening has been found to be normotensive.  Plan: Hold home lisinopril in the setting of current n.p.o. status.  Close monitoring of ensuing blood pressures via routine vital signs.      #) Hyperlipidemia: On Lipitor as an outpatient. I plan: In setting of current n.p.o. status, will hold home statin for now.      #) GERD: On Pepcid as an outpatient.  Plan: In setting of current n.p.o. status, will hold home Pepcid for now.  I have ordered Protonix 40 mg IV daily while n.p.o. status persist.     #) Chronic tobacco abuse: The patient reports that she is a current smoker having smoked between half pack per day to a full pack per day for almost the last 50 years.  Plan: Counseled the patient on the importance of complete smoking discontinuation, particular the context of her report of concomitant peripheral artery disease.  I have ordered a as needed nicotine patch for use during this hospitalization.    DVT prophylaxis: SCDs Code Status: Full code Family Communication: None Disposition Plan: Per Rounding Team Consults  called: None Admission status: Inpatient; MedSurg   PLEASE NOTE THAT DRAGON DICTATION SOFTWARE WAS USED IN THE CONSTRUCTION OF THIS NOTE.   [facility] Pager [phone] From [place].   Otherwise, please contact night-coverage  [email or web address] Password [facility]  09/04/2019, 8:56 PM

## 2019-09-04 NOTE — ED Provider Notes (Signed)
[name] Provider Note   CSN: [id number] Arrival date & time: 09/04/19  1916     History Chief Complaint  Patient presents with  . Bowel Obstruction    Leah [name] is a 74 y.o. female.  HPI   Patient presents to the ED for evaluation for an abnormal abdominal CT scan. Patient has been having trouble with abdominal pain for at least the last week or so. The last few days she has had significant diarrhea prior to that she was having decreased bowel movements. Patient has not had any nausea or vomiting. She denies any fevers. She has a history of prior abdominal surgeries. Patient had an outpatient CT scan. She also had laboratory test. Patient was called this evening because the CT scan showed ileus versus small bowel obstruction as well as colitis.  Past Medical History:  Diagnosis Date  . Allergy   . Anemia   . Anxiety   . Barrett esophagus   . Bladder pain   . Chronic bronchitis ([place])    per pt last episode july 2016  . Complication of anesthesia    slow to wake  . COPD (chronic obstructive pulmonary disease) ([place])   . Depression   . Diverticulosis   . Frequency of urination   . GERD (gastroesophageal reflux disease)   . Hiatal hernia   . History of adenomatous polyp of colon    tubular adenoma  . History of chronic gastritis   . History of diverticulitis of colon   . History of esophageal dilatation    for stricture  . History of esophageal spasm   . Hyperlipidemia   . Hypertension   . IBS (irritable bowel syndrome)   . Interstitial cystitis   . PAD (peripheral artery disease) ([place])   . Productive cough    intermittant  . Smokers' cough ([place])   . Urgency of urination     Patient Active Problem List   Diagnosis Date Noted  . Preventative health care 08/27/2019  . Family history of coronary artery disease 02/17/2019  . Claudication in peripheral vascular disease ([place]) 10/14/2018  . Dyspnea 09/03/2018  .  Calcification of native coronary artery 09/03/2018  . PAD (peripheral artery disease) ([place]) 09/03/2018  . History of esophageal stricture 06/19/2018  . Small bowel obstruction due to adhesions ([place]) 04/08/2016  . Frozen shoulder 04/27/2015  . Hematoma 09/07/2014  . Neck pain 05/19/2014  . Fatigue 05/08/2014  . Back pain 05/08/2014  . Pain in joint, upper arm 05/08/2014  . LLQ abdominal pain 03/02/2014  . History of diverticulitis 03/02/2014  . COPD exacerbation ([place]) 01/28/2014  . Oral thrush 01/28/2014  . Influenza 10/27/2013  . Appendicitis 09/19/2013  . Abdominal pain, left lower quadrant 03/05/2013  . Essential hypertension 02/07/2013  . Sinusitis, acute 01/15/2013  . COPD GOLD II 01/15/2013  . Smoker 12/20/2012  . Epigastric abdominal pain 09/03/2012  . Left lower quadrant pain 01/24/2011  . Leg pain, left 06/07/2009  . ROSACEA 04/16/2009  . B12 DEFICIENCY 04/09/2009  . ANEMIA, IRON DEFICIENCY 03/19/2009  . MALAISE AND FATIGUE 03/19/2009  . NECK PAIN, LEFT 01/11/2009  . URI 07/30/2008  . Constipation 05/26/2008  . NAUSEA AND VOMITING 05/26/2008  . Claudication ([place]) 04/27/2008  . VOMITING 04/17/2008  . Dysphagia 04/17/2008  . ANXIETY DEPRESSION 03/12/2008  . TREMOR 03/12/2008  . Dyslipidemia, goal LDL below 70 11/13/2007  . DEPRESSION 11/13/2007  . ESOPHAGEAL SPASM 11/13/2007  . GERD 11/13/2007  . IBS 11/13/2007  .  CARPAL TUNNEL SYNDROME, HX OF 11/13/2007  . Diverticulosis of colon (without mention of hemorrhage) 03/14/2007  . GASTRITIS, CHRONIC 04/10/2002  . HIATAL HERNIA 04/10/2002    Past Surgical History:  Procedure Laterality Date  . ABDOMINAL AORTOGRAM N/A 10/14/2018   Procedure: ABDOMINAL AORTOGRAM;  Surgeon: Lorretta Harp, MD;  Location: [facility];  Service: Cardiovascular;  Laterality: N/A;  . ABDOMINAL HYSTERECTOMY  1975  . ANTERIOR CERVICAL DECOMP/DISCECTOMY FUSION N/A 09/07/2014   Procedure: Exploration of Fusion and Removal of Anterior  Cervical Hematoma;  Surgeon: Newman Pies, MD;  Location[facility] NEURO ORS;  Service: Neurosurgery;  Laterality: N/A;  . CARPAL TUNNEL RELEASE Right 1993  . COLONOSCOPY  last one 04-30-2013  . CYSTO WITH HYDRODISTENSION N/A 06/08/2015   Procedure: CYSTOSCOPY/HYDRODISTENSION INSTILLATION OF MARCAINE AND PYRIDIUM;  Surgeon: Irine Seal, MD;  Location: [facility];  Service: Urology;  Laterality: N/A;  . CYSTO/  HOD/  BLADDER BX  1990's  . ESOPHAGOGASTRODUODENOSCOPY  last one 09-11-2012  . LAPAROSCOPIC APPENDECTOMY N/A 09/21/2013   Procedure: APPENDECTOMY LAPAROSCOPIC;  Surgeon: Rolm Bookbinder, MD;  Location: [facility];  Service: General;  Laterality: N/A;  . LAPAROSCOPIC CHOLECYSTECTOMY  2002  . LAPAROSCOPY SIGMOID COLECTOMY  10-05-2008   diverticulitis  . LOWER EXTREMITY ANGIOGRAPHY Left 10/14/2018  . LOWER EXTREMITY ANGIOGRAPHY Bilateral 10/14/2018   Procedure: Lower Extremity Angiography;  Surgeon: Lorretta Harp, MD;  Location: [facility];  Service: Cardiovascular;  Laterality: Bilateral;  ILIACS  . MICROLARYNGOSCOPY  09-27-2006   w/ true vocal cord stripping  and bilateral bx's of lesion's (benign)  . PERIPHERAL VASCULAR INTERVENTION  10/14/2018   Procedure: PERIPHERAL VASCULAR INTERVENTION;  Surgeon: Lorretta Harp, MD;  Location: [facility];  Service: Cardiovascular;;  . SINUS SURGERY WITH INSTATRAK  1991  . [place]  . TONSILLECTOMY AND ADENOIDECTOMY  1964     OB History   No obstetric history on file.     Family History  Problem Relation Age of Onset  . Pancreatic cancer Mother   . Colon polyps Mother   . Pancreatic cancer Father   . Diabetes Father   . Heart disease Father   . Leukemia Father   . Prostate cancer Father   . Hypertension Father   . Colon cancer Neg Hx   . Esophageal cancer Neg Hx   . Rectal cancer Neg Hx   . Stomach cancer Neg Hx     Social History   Tobacco Use  . Smoking status: Current  Every Day Smoker    Packs/day: 0.75    Years: 47.00    Pack years: 35.25    Types: Cigarettes  . Smokeless tobacco: Never Used  . Tobacco comment: counseled about smoking cessation  Substance Use Topics  . Alcohol use: No    Alcohol/week: 0.0 standard drinks  . Drug use: No    Home Medications Prior to Admission medications   Medication Sig Start Date End Date Taking? Authorizing Provider  aspirin EC 81 MG EC tablet Take 1 tablet (81 mg total) by mouth daily. 10/15/18   Cheryln Manly, NP  atorvastatin (LIPITOR) 80 MG tablet TAKE 1 TABLET (80 MG TOTAL) BY MOUTH DAILY. 05/29/19   Roma Schanz R, DO  clopidogrel (PLAVIX) 75 MG tablet TAKE 1 TABLET BY MOUTH EVERY DAY 04/18/19   Hilty, Nadean Corwin, MD  dicyclomine (BENTYL) 10 MG capsule Take 1 capsule (10 mg total) by mouth 2 (two) times daily as  needed for spasms. 04/11/19   Doran Stabler, MD  estradiol (CLIMARA - DOSED IN MG/24 HR) 0.1 mg/24hr patch Place 0.1 mg onto the skin every Monday. 02/08/16   [provider]  ezetimibe (ZETIA) 10 MG tablet Take 1 tablet (10 mg total) by mouth daily. 04/17/19   Roma Schanz R, DO  famotidine (PEPCID) 20 MG tablet TAKE 1 TABLET TWICE DAILY 06/02/19   Carollee Herter, Yvonne R, DO  lisinopril (ZESTRIL) 20 MG tablet TAKE 1 TABLET BY MOUTH EVERY DAY 06/19/19   Carollee Herter, Alferd Apa, DO  LORazepam (ATIVAN) 0.5 MG tablet Take 0.5 mg by mouth at bedtime.  12/04/14   [provider]  REPATHA SURECLICK [id number] MG/ML SOAJ INJECT 1 DOSE INTO THE SKIN EVERY 14 (FOURTEEN) DAYS. 09/04/19   Hilty, Nadean Corwin, MD  sertraline (ZOLOFT) 50 MG tablet Take 50 mg by mouth at bedtime.    [provider]    Allergies    Amoxicillin-pot clavulanate, Avelox [moxifloxacin hcl in nacl], Ciprofloxacin, Keflex [cephalexin], Penicillins, Sulfa antibiotics, Prednisone, Acyclovir and related, Clindamycin/lincomycin, Levaquin [levofloxacin], and Zyvox [linezolid]  Review of Systems   Review of  Systems  All other systems reviewed and are negative.   Physical Exam Updated Vital Signs BP 125/79 (BP Location: Right Arm)   Pulse 77   Temp 98.3 F (36.8 C) (Oral)   Resp 16   SpO2 99%   Physical Exam Vitals and nursing note reviewed.  Constitutional:      General: She is not in acute distress.    Appearance: She is well-developed.  HENT:     Head: Normocephalic and atraumatic.     Right Ear: External ear normal.     Left Ear: External ear normal.  Eyes:     General: No scleral icterus.       Right eye: No discharge.        Left eye: No discharge.     Conjunctiva/sclera: Conjunctivae normal.  Neck:     Trachea: No tracheal deviation.  Cardiovascular:     Rate and Rhythm: Normal rate and regular rhythm.  Pulmonary:     Effort: Pulmonary effort is normal. No respiratory distress.     Breath sounds: Normal breath sounds. No stridor. No wheezing or rales.  Abdominal:     General: Bowel sounds are normal. There is no distension.     Palpations: Abdomen is soft.     Tenderness: There is abdominal tenderness. There is no guarding or rebound.  Musculoskeletal:        General: No tenderness.     Cervical back: Neck supple.  Skin:    General: Skin is warm and dry.     Findings: No rash.  Neurological:     Mental Status: She is alert.     Cranial Nerves: No cranial nerve deficit (no facial droop, extraocular movements intact, no slurred speech).     Sensory: No sensory deficit.     Motor: No abnormal muscle tone or seizure activity.     Coordination: Coordination normal.     ED Results / Procedures / Treatments   Labs (all labs ordered are listed, but only abnormal results are displayed) Labs Reviewed  SARS CORONAVIRUS 2 (TAT 6-24 HRS)   Outpatient labs from today reviewed. White blood cell count 6.7 hemoglobin 13.8. Platelet count 251. Comprehensive metabolic panel essentially normal EKG None  Radiology CT Abdomen Pelvis W Contrast  Result Date:  09/04/2019 CLINICAL DATA:  Abdominal pain, chills. EXAM:  CT ABDOMEN AND PELVIS WITH CONTRAST TECHNIQUE: Multidetector CT imaging of the abdomen and pelvis was performed using the standard protocol following bolus administration of intravenous contrast. CONTRAST:  125mL OMNIPAQUE IOHEXOL 300 MG/ML  SOLN COMPARISON:  04/08/2016 FINDINGS: Lower chest: No acute abnormality. Hepatobiliary: Scattered liver cysts are identified. Previous cholecystectomy. No significant biliary dilatation. Pancreas: Pancreas divisum anatomy is suspected. No main duct dilatation, inflammation or mass. Spleen: Spleen is unremarkable Adrenals/Urinary Tract: The adrenal glands are normal. No kidney mass or hydronephrosis. Urinary bladder is unremarkable. Stomach/Bowel: Stomach is unremarkable. Proximal small bowel loops appear normal within the lower abdomen and pelvis there are multiple loops of small bowel which are increased in caliber measuring up to 2.3 cm with air-fluid levels. Gradual transition to normal to decreased caliber distal small bowel noted. There is abnormal wall thickening with surrounding fat stranding involving the descending colon and sigmoid colon concerning for colitis. No pneumatosis or bowel perforation. Vascular/Lymphatic: Aortic atherosclerosis. No aneurysm. No abdominopelvic adenopathy. Reproductive: Status post hysterectomy. No adnexal masses. Other: Trace free fluid noted within the pelvis. Musculoskeletal: No acute or significant osseous findings. IMPRESSION: 1. Abnormal wall thickening and surrounding fat stranding involving the descending colon and sigmoid colon compatible with colitis. No pneumatosis or bowel perforation. 2. Mildly dilated loops of small bowel with air-fluid levels. Findings may reflect ileus or partial small bowel obstruction. 3. Pancreas divisum anatomy. Aortic Atherosclerosis (ICD10-I70.0). Electronically Signed   By: Kerby Moors M.D.   On: 09/04/2019 17:36    Procedures Procedures  (including critical care time)  Medications Ordered in ED Medications  morphine 4 MG/ML injection 4 mg (has no administration in time range)  sodium chloride 0.9 % bolus 1,000 mL (has no administration in time range)    Followed by  0.9 %  sodium chloride infusion (has no administration in time range)  metroNIDAZOLE (FLAGYL) IVPB 500 mg (has no administration in time range)  aztreonam (AZACTAM) 2 g in sodium chloride 0.9 % 100 mL IVPB (has no administration in time range)    ED Course  I have reviewed the triage vital signs and the nursing notes.  Pertinent labs & imaging results that were available during my care of the patient were reviewed by me and considered in my medical decision making (see chart for details).    MDM Rules/Calculators/A&P                       Patient's outpatient CT scan demonstrates a colitis. There is also question of ileus versus partial small bowel obstruction. Patient denies any nausea vomiting. More suspicious of an ileus rather than obstruction. I think we can hold off on NG tube at this time. Patient has multiple allergies so I discussed with the pharmacist on call and we we will start her on as treatment and Flagyl. Plan admission to the hospital for further treatment and observation. Final Clinical Impression(s) / ED Diagnoses Final diagnoses:  Colitis  Ileus [facility])      Dorie Rank, MD 09/04/19 2055

## 2019-09-04 NOTE — Patient Instructions (Signed)
If you are age 72 or older, your body mass index should be between 23-30. Your Body mass index is 20.94 kg/m. If this is out of the aforementioned range listed, please consider follow up with your Primary Care Provider.  If you are age 73 or younger, your body mass index should be between 19-25. Your Body mass index is 20.94 kg/m. If this is out of the aformentioned range listed, please consider follow up with your Primary Care Provider.   You have been scheduled for a CT scan of the abdomen and pelvis at [facility] (1st floor radiology)  You are scheduled on Wednesday 09/04/19 - now.

## 2019-09-04 NOTE — Telephone Encounter (Signed)
Noted, patient OV with Dr. Loletha Carrow cancelled on 2/24.

## 2019-09-05 ENCOUNTER — Inpatient Hospital Stay (HOSPITAL_COMMUNITY): Payer: Medicare HMO

## 2019-09-05 DIAGNOSIS — R1032 Left lower quadrant pain: Secondary | ICD-10-CM

## 2019-09-05 DIAGNOSIS — R933 Abnormal findings on diagnostic imaging of other parts of digestive tract: Secondary | ICD-10-CM

## 2019-09-05 DIAGNOSIS — I10 Essential (primary) hypertension: Secondary | ICD-10-CM

## 2019-09-05 DIAGNOSIS — E785 Hyperlipidemia, unspecified: Secondary | ICD-10-CM

## 2019-09-05 DIAGNOSIS — K529 Noninfective gastroenteritis and colitis, unspecified: Secondary | ICD-10-CM | POA: Diagnosis present

## 2019-09-05 DIAGNOSIS — K219 Gastro-esophageal reflux disease without esophagitis: Secondary | ICD-10-CM

## 2019-09-05 DIAGNOSIS — K56600 Partial intestinal obstruction, unspecified as to cause: Secondary | ICD-10-CM

## 2019-09-05 LAB — CBC WITH DIFFERENTIAL/PLATELET
Abs Immature Granulocytes: 0.01 10*3/uL (ref 0.00–0.07)
Basophils Absolute: 0.1 10*3/uL (ref 0.0–0.1)
Basophils Relative: 1 %
Eosinophils Absolute: 0.3 10*3/uL (ref 0.0–0.5)
Eosinophils Relative: 5 %
HCT: 42.4 % (ref 36.0–46.0)
Hemoglobin: 13.3 g/dL (ref 12.0–15.0)
Immature Granulocytes: 0 %
Lymphocytes Relative: 32 %
Lymphs Abs: 2.1 10*3/uL (ref 0.7–4.0)
MCH: 30.4 pg (ref 26.0–34.0)
MCHC: 31.4 g/dL (ref 30.0–36.0)
MCV: 97 fL (ref 80.0–100.0)
Monocytes Absolute: 0.5 10*3/uL (ref 0.1–1.0)
Monocytes Relative: 7 %
Neutro Abs: 3.5 10*3/uL (ref 1.7–7.7)
Neutrophils Relative %: 55 %
Platelets: 198 10*3/uL (ref 150–400)
RBC: 4.37 MIL/uL (ref 3.87–5.11)
RDW: 13.4 % (ref 11.5–15.5)
WBC: 6.4 10*3/uL (ref 4.0–10.5)
nRBC: 0 % (ref 0.0–0.2)

## 2019-09-05 LAB — LIPASE, BLOOD: Lipase: 26 U/L (ref 11–51)

## 2019-09-05 LAB — COMPREHENSIVE METABOLIC PANEL
ALT: 10 U/L (ref 0–44)
AST: 17 U/L (ref 15–41)
Albumin: 3.6 g/dL (ref 3.5–5.0)
Alkaline Phosphatase: 64 U/L (ref 38–126)
Anion gap: 7 (ref 5–15)
BUN: 10 mg/dL (ref 8–23)
CO2: 24 mmol/L (ref 22–32)
Calcium: 8.3 mg/dL — ABNORMAL LOW (ref 8.9–10.3)
Chloride: 106 mmol/L (ref 98–111)
Creatinine, Ser: 0.73 mg/dL (ref 0.44–1.00)
GFR calc Af Amer: >60 mL/min (ref >60)
GFR calc non Af Amer: >60 mL/min (ref >60)
Glucose, Bld: 88 mg/dL (ref 70–99)
Potassium: 3.9 mmol/L (ref 3.5–5.1)
Sodium: 137 mmol/L (ref 135–145)
Total Bilirubin: 0.4 mg/dL (ref 0.3–1.2)
Total Protein: 6.2 g/dL — ABNORMAL LOW (ref 6.5–8.1)

## 2019-09-05 LAB — SARS CORONAVIRUS 2 (TAT 6-24 HRS): SARS Coronavirus 2: NEGATIVE

## 2019-09-05 LAB — LACTIC ACID, PLASMA: Lactic Acid, Venous: 0.8 mmol/L (ref 0.5–1.9)

## 2019-09-05 LAB — MAGNESIUM: Magnesium: 2 mg/dL (ref 1.7–2.4)

## 2019-09-05 LAB — TSH: TSH: 3.976 u[IU]/mL (ref 0.350–4.500)

## 2019-09-05 MED ORDER — PROMETHAZINE HCL 25 MG/ML IJ SOLN: 12.5000 mg | Freq: Three times a day (TID) | INTRAMUSCULAR | Status: DC | PRN

## 2019-09-05 NOTE — Consult Note (Signed)
Referring Provider: Dr. Cordelia Poche  Primary Care Physician:  Carollee Herter, Alferd Apa, DO Primary Gastroenterologist:  Dr. Havery Moros  Reason for Consultation:  Partal small bowel/obstruction, colitis  HPI: Leah [name] is a 74 y.o. female with a past medical history of anxiety, depression, hypertension, peripheral artery disease on Plavix and ASA, hyperlipidemia, anemia, COPD, GERD, diverticulosis, colon polyps and partial small bowel obstruction 2017. Past hysterectomy 1975, sigmoid resection secondary to diverticular disease 09/2008, cholecystectomy 2002, appendectomy 2015 and  PTCA with stent placement left common iliac artery by Dr. Quay Burow 09/2018.  She has a history of chronic constipation which she describes passing small balls of stool daily then can skip 3 to 4 days without passing a bowel movement.  Her constipation  worsened since the end of January.  She took a bottle of MiraLAX mixed in 2 L of Gatorade and took 2 Dulcolax tablets on 08/25/2019 which resulted in passing a mix of brown watery stool with bits of solid stool.  Since that time, she continues to have small balls of stool next with brown water.  Rectal bleeding or melena.  He is on aspirin and Plavix.  She developed lower abdominal pain, primarily to the left lower quadrant which started 2 weeks ago.  Her lower abdominal pain worsened and she vomited fully digested food on 2 occasions on Wednesday 09/03/2019.  No fever.  She contacted our office and she was seen by Leah Newer, PA-C yesterday.  An outpatient abdominal/pelvic CT was done which identified abnormal wall thickening and surrounding fat stranding to the descending colon and sigmoid compatible with colitis and mildly dilated loops of small bowel with air free fluid level suggesting a possible ileus versus partial small bowel obstruction.  [facility] for further evaluation.  Currently, she complains of nausea without any  further vomiting.  She remains n.p.o.  She denies having any dysphagia or heartburn.  No upper abdominal pain.  She continues to have lower abdominal pain which is worse to the left lower quadrant.  Never, sweats or chills.  She possibly lost a few pounds over the past few weeks.  She passed gas per the rectum yesterday and this morning.  Her last bowel movement was 2/3.  She stated her current symptoms are somewhat similar to when she was diagnosed with a partial small bowel obstruction in 2017.  At that time, an abdominal/pelvic CT showed a low-grade or early distal small bowel obstruction with transition zone in the posterior pelvis likely related to underlying adhesions.  Her symptoms improved therefore surgical intervention was deferred. She has a history of tubular adenomatous colon polyps.  Her most recent colonoscopy was completed 05/2018 which resulted in a poor bowel prep.  She was advised to repeat a colonoscopy in 1 year which has not been done.  She smokes 1/2 pack of cigarettes daily for the past 50 years.  No alcohol or drug use.  No family history of esophageal, gastric or colorectal cancer. Mother with history of colon polyps and pancreatic cancer. Father with history of pancreatic cancer.  No family history of IBD.  She is hungry.  No other complaints at this time.  Last dose of Plavix was taken on Wednesday 2/3.  ED course: Sodium 137.  Potassium 4.3.  Chloride 102.  CO2 29.  Glucose 101.  BUN 12.  Creatinine 0.79.  Calcium 9.2.  Alk phos 73.  Abdomen 4.2.  AST 11.  ALT 8.  Total bili 0.3.  WBC 6.7.  Hemoglobin 13.8.  Hematocrit 42.1.  Platelet 251.  SARS coronavirus 2 Negative.  Urinalysis negative.  Abdominal/pelvic CT with contrast: 1. Abnormal wall thickening and surrounding fat stranding involving the descending colon and sigmoid colon compatible with colitis. No pneumatosis or bowel perforation. 2. Mildly dilated loops of small bowel with air-fluid levels. Findings may reflect ileus  or partial small bowel obstruction. 3. Pancreas divisum anatomy. 4. Scattered liver cysts are identified.  Previous cholecystectomy.  No significant biliary dilatation. 5. Aortic Atherosclerosis   Most recent GI procedures:  EGD 08/13/2018: - 3 cm hiatal hernia. - Benign-appearing esophageal stenosis. Dilated. - Multiple fundic gland polyps. - Normal examined duodenum. - No specimens collected.  EGD 09/11/2012: -Nonobstructing esophageal stricture status post dilatation to 7 mm -3 CM hiatal hernia -Presbysophagus  Colonoscopy 05/31/2018 Dr. Havery Moros: - Decreased sphincter tone found on digital rectal exam. - Stool in the entire examined colon. - Patent end-to-side colo-colonic anastomosis, characterized by healthy appearing mucosa. - The examination was otherwise normal. - Repeat a colonoscopy in 1 year with a 2 day prep  Colonoscopy 04/30/2013 - A 3 to 5 mm tubular adenomatous polyp removed from the descending colon. - Mild diverticulosis throughout the colon. - Evidence of prior colo-- colonic surgical anastomosis in the sigmoid colon at 20 cm.    Past Medical History:  Diagnosis Date  . Allergy   . Anemia   . Anxiety   . Barrett esophagus   . Bladder pain   . Chronic bronchitis ([place])    per pt last episode july 2016  . Complication of anesthesia    slow to wake  . COPD (chronic obstructive pulmonary disease) ([place])   . Depression   . Diverticulosis   . Frequency of urination   . GERD (gastroesophageal reflux disease)   . Hiatal hernia   . History of adenomatous polyp of colon    tubular adenoma  . History of chronic gastritis   . History of diverticulitis of colon   . History of esophageal dilatation    for stricture  . History of esophageal spasm   . Hyperlipidemia   . Hypertension   . IBS (irritable bowel syndrome)   . Interstitial cystitis   . PAD (peripheral artery disease) ([place])   . Productive cough    intermittant  . Smokers' cough ([place])   .  Urgency of urination     Past Surgical History:  Procedure Laterality Date  . ABDOMINAL AORTOGRAM N/A 10/14/2018   Procedure: ABDOMINAL AORTOGRAM;  Surgeon: Lorretta Harp, MD;  Location: [place] [facility];  Service: Cardiovascular;  Laterality: N/A;  . ABDOMINAL HYSTERECTOMY  1975  . ANTERIOR CERVICAL DECOMP/DISCECTOMY FUSION N/A 09/07/2014   Procedure: Exploration of Fusion and Removal of Anterior Cervical Hematoma;  Surgeon: Newman Pies, MD;  Location: [facility];  Service: Neurosurgery;  Laterality: N/A;  . CARPAL TUNNEL RELEASE Right 1993  . COLONOSCOPY  last one 04-30-2013  . CYSTO WITH HYDRODISTENSION N/A 06/08/2015   Procedure: CYSTOSCOPY/HYDRODISTENSION INSTILLATION OF MARCAINE AND PYRIDIUM;  Surgeon: Irine Seal, MD;  Location: [facility];  Service: Urology;  Laterality: N/A;  . CYSTO/  HOD/  BLADDER BX  1990's  . ESOPHAGOGASTRODUODENOSCOPY  last one 09-11-2012  . LAPAROSCOPIC APPENDECTOMY N/A 09/21/2013   Procedure: APPENDECTOMY LAPAROSCOPIC;  Surgeon: Rolm Bookbinder, MD;  Location: [place];  Service: General;  Laterality: N/A;  . LAPAROSCOPIC CHOLECYSTECTOMY  2002  . LAPAROSCOPY SIGMOID COLECTOMY  10-05-2008  diverticulitis  . LOWER EXTREMITY ANGIOGRAPHY Left 10/14/2018  . LOWER EXTREMITY ANGIOGRAPHY Bilateral 10/14/2018   Procedure: Lower Extremity Angiography;  Surgeon: Lorretta Harp, MD;  Location: [place] [facility];  Service: Cardiovascular;  Laterality: Bilateral;  ILIACS  . MICROLARYNGOSCOPY  09-27-2006   w/ true vocal cord stripping  and bilateral bx's of lesion's (benign)  . PERIPHERAL VASCULAR INTERVENTION  10/14/2018   Procedure: PERIPHERAL VASCULAR INTERVENTION;  Surgeon: Lorretta Harp, MD;  Location: [place] [facility];  Service: Cardiovascular;;  . SINUS SURGERY WITH INSTATRAK  1991  . [place]  . TONSILLECTOMY AND ADENOIDECTOMY  1964    Prior to Admission medications   Medication Sig Start Date End  Date Taking? Authorizing Provider  aspirin EC 81 MG EC tablet Take 1 tablet (81 mg total) by mouth daily. 10/15/18  Yes Reino Bellis B, NP  atorvastatin (LIPITOR) 80 MG tablet TAKE 1 TABLET (80 MG TOTAL) BY MOUTH DAILY. 05/29/19  Yes Roma Schanz R, DO  clopidogrel (PLAVIX) 75 MG tablet TAKE 1 TABLET BY MOUTH EVERY DAY 04/18/19  Yes Hilty, Nadean Corwin, MD  dicyclomine (BENTYL) 10 MG capsule Take 1 capsule (10 mg total) by mouth 2 (two) times daily as needed for spasms. 04/11/19  Yes Danis, Kirke Corin, MD  estradiol (CLIMARA - DOSED IN MG/24 HR) 0.1 mg/24hr patch Place 0.1 mg onto the skin every Monday. 02/08/16  Yes [provider]  ezetimibe (ZETIA) 10 MG tablet Take 1 tablet (10 mg total) by mouth daily. 04/17/19  Yes Roma Schanz R, DO  famotidine (PEPCID) 20 MG tablet TAKE 1 TABLET TWICE DAILY 06/02/19  Yes Carollee Herter, Yvonne R, DO  lisinopril (ZESTRIL) 20 MG tablet TAKE 1 TABLET BY MOUTH EVERY DAY 06/19/19  Yes Roma Schanz R, DO  LORazepam (ATIVAN) 0.5 MG tablet Take 0.5 mg by mouth at bedtime.  12/04/14  Yes [provider]  REPATHA SURECLICK 161 MG/ML SOAJ INJECT 1 DOSE INTO THE SKIN EVERY 14 (FOURTEEN) DAYS. Patient taking differently: Inject 1 Dose into the skin every 14 (fourteen) days.  09/04/19  Yes Hilty, Nadean Corwin, MD  sertraline (ZOLOFT) 50 MG tablet Take 100 mg by mouth at bedtime.    Yes [provider]    Current Facility-Administered Medications  Medication Dose Route Frequency Provider Last Rate Last Admin  . 0.9 %  sodium chloride infusion  1,000 mL Intravenous Continuous Howerter, Justin B, DO 125 mL/hr at 09/05/19 0120 1,000 mL at 09/05/19 0120  . acetaminophen (TYLENOL) tablet 650 mg  650 mg Oral Q6H PRN Howerter, Justin B, DO       Or  . acetaminophen (TYLENOL) suppository 650 mg  650 mg Rectal Q6H PRN Howerter, Justin B, DO      . [START ON 09/08/2019] estradiol (CLIMARA - Dosed in mg/24 hr) patch 0.1 mg  0.1 mg Transdermal Q Mon  Howerter, Justin B, DO      . LORazepam (ATIVAN) injection 0.5 mg  0.5 mg Intravenous QHS PRN Howerter, Justin B, DO      . morphine 2 MG/ML injection 2 mg  2 mg Intravenous Q2H PRN Howerter, Justin B, DO   2 mg at 09/05/19 0123  . nicotine (NICODERM CQ - dosed in mg/24 hours) patch 14 mg  14 mg Transdermal Daily PRN Howerter, Justin B, DO      . ondansetron (ZOFRAN) injection 4 mg  4 mg Intravenous Q6H PRN Howerter, Justin B, DO      .  pantoprazole (PROTONIX) injection 40 mg  40 mg Intravenous Q24H Howerter, Justin B, DO      . sodium chloride flush (NS) 0.9 % injection 3 mL  3 mL Intravenous Q12H Howerter, Justin B, DO   3 mL at 09/04/19 2316   Facility-Administered Medications Ordered in Other Encounters  Medication Dose Route Frequency Provider Last Rate Last Admin  . triamcinolone acetonide (KENALOG-40) injection (RADIOLOGY ONLY) 60 mg  60 mg Epidural Once Viona Gilmore D, NP        Allergies as of 09/04/2019 - Review Complete 09/04/2019  Allergen Reaction Noted  . Amoxicillin-pot clavulanate Shortness Of Breath, Itching, and Swelling 09/19/2013  . Avelox [moxifloxacin hcl in nacl] Swelling 06/08/2015  . Ciprofloxacin Shortness Of Breath, Itching, and Swelling 03/12/2008  . Keflex [cephalexin] Shortness Of Breath and Swelling 09/08/2014  . Penicillins Shortness Of Breath, Itching, and Swelling 03/12/2008  . Sulfa antibiotics Shortness Of Breath, Itching, and Swelling 06/04/2015  . Prednisone Other (See Comments) 01/15/2013  . Acyclovir and related Other (See Comments) 04/13/2015  . Clindamycin/lincomycin Itching and Swelling 03/29/2018  . Levaquin [levofloxacin] Hives 10/14/2018  . Zyvox [linezolid] Diarrhea 04/02/2018    Family History  Problem Relation Age of Onset  . Pancreatic cancer Mother   . Colon polyps Mother   . Pancreatic cancer Father   . Diabetes Father   . Heart disease Father   . Leukemia Father   . Prostate cancer Father   . Hypertension Father   . Colon  cancer Neg Hx   . Esophageal cancer Neg Hx   . Rectal cancer Neg Hx   . Stomach cancer Neg Hx     Social History   Socioeconomic History  . Marital status: Married    Spouse name: Not on file  . Number of children: Not on file  . Years of education: Not on file  . Highest education level: Not on file  Occupational History  . Not on file  Tobacco Use  . Smoking status: Current Every Day Smoker    Packs/day: 0.75    Years: 47.00    Pack years: 35.25    Types: Cigarettes  . Smokeless tobacco: Never Used  . Tobacco comment: counseled about smoking cessation  Substance and Sexual Activity  . Alcohol use: No    Alcohol/week: 0.0 standard drinks  . Drug use: No  . Sexual activity: Not Currently    Birth control/protection: Post-menopausal  Other Topics Concern  . Not on file  Social History Narrative  . Not on file   Social Determinants of Health   Financial Resource Strain:   . Difficulty of Paying Living Expenses: Not on file  Food Insecurity:   . Worried About Charity fundraiser in the Last Year: Not on file  . Ran Out of Food in the Last Year: Not on file  Transportation Needs:   . Lack of Transportation (Medical): Not on file  . Lack of Transportation (Non-Medical): Not on file  Physical Activity:   . Days of Exercise per Week: Not on file  . Minutes of Exercise per Session: Not on file  Stress:   . Feeling of Stress : Not on file  Social Connections:   . Frequency of Communication with Friends and Family: Not on file  . Frequency of Social Gatherings with Friends and Family: Not on file  . Attends Religious Services: Not on file  . Active Member of Clubs or Organizations: Not on file  . Attends Archivist  Meetings: Not on file  . Marital Status: Not on file  Intimate Partner Violence:   . Fear of Current or Ex-Partner: Not on file  . Emotionally Abused: Not on file  . Physically Abused: Not on file  . Sexually Abused: Not on file    Review of  Systems: Gen: Less than 5 pounds over the past 2 weeks.  Denies fever, sweats or chills. CV: Denies chest pain, palpitations or edema. Resp: Denies cough, shortness of breath of hemoptysis.  GI:   See HPI. GU : Denies urinary burning, blood in urine, increased urinary frequency or incontinence. MS:  Denies joint pain, muscles aches or weakness. Derm: Denies rash, itchiness, skin lesions or unhealing ulcers. Psych: Denies depression, anxiety, memory loss, suicidal ideation or confusion. Heme: Denies easy bruising, bleeding. Neuro:  Denies headaches, dizziness or paresthesias. Endo:  Denies any problems with DM, thyroid or adrenal function.  Physical Exam: Vital signs in last 24 hours: Temp:  [97.7 F (36.5 C)-98.3 F (36.8 C)] 98.2 F (36.8 C) (02/05 0452) Pulse Rate:  [58-88] 87 (02/05 0452) Resp:  [15-20] 20 (02/05 0452) BP: (100-160)/(60-81) 134/81 (02/05 0452) SpO2:  [92 %-100 %] 92 % (02/05 0452) Weight:  [50.3 kg] 50.3 kg (02/04 1406) Last BM Date: 09/04/19 General:  Alert,  well-developed, well-nourished, pleasant and cooperative in NAD. Head:  Normocephalic and atraumatic. Eyes:  No scleral icterus. Conjunctiva pink. Ears:  Normal auditory acuity. Nose:  No deformity, discharge or lesions. Mouth: No deformity or lesions.   Neck:  Supple. No lymphadenopathy or thyromegaly.  Lungs: Scattered wheezes otherwise clear. Heart: Regular rate and rhythm, no murmurs. Abdomen: Soft, moderate tenderness throughout the lower abdomen greater to the left lower quadrant without rebound or guarding, the lower abdomen is slightly distended.  Hypoactive bowel sounds present to all 4 quadrants.  No HSM.  Lower midline abdominal scar intact. Rectal:  Deferred. Msk:  Symmetrical without gross deformities.  Pulses:  Normal pulses noted. Extremities:  Without clubbing or edema. Neurologic:  Alert and  oriented x4. No focal deficits.  Skin:  Intact without significant lesions or rashes. Psych:   Alert and cooperative. Normal mood and affect.  Intake/Output from previous day: No intake/output data recorded. Intake/Output this shift: No intake/output data recorded.  Lab Results: Recent Labs    09/04/19 1438 09/05/19 0054  WBC 6.7 6.4  HGB 13.8 13.3  HCT 42.1 42.4  PLT 251.0 198   BMET Recent Labs    09/04/19 1438 09/05/19 0054  NA 137 137  K 4.3 3.9  CL 102 106  CO2 29 24  GLUCOSE 101* 88  BUN 12 10  CREATININE 0.79 0.73  CALCIUM 9.2 8.3*   LFT Recent Labs    09/05/19 0054  PROT 6.2*  ALBUMIN 3.6  AST 17  ALT 10  ALKPHOS 64  BILITOT 0.4   PT/INR No results for input(s): LABPROT, INR in the last 72 hours. Hepatitis Panel No results for input(s): HEPBSAG, HCVAB, HEPAIGM, HEPBIGM in the last 72 hours.    Studies/Results: CT Abdomen Pelvis W Contrast  Result Date: 09/04/2019 CLINICAL DATA:  Abdominal pain, chills. EXAM: CT ABDOMEN AND PELVIS WITH CONTRAST TECHNIQUE: Multidetector CT imaging of the abdomen and pelvis was performed using the standard protocol following bolus administration of intravenous contrast. CONTRAST:  179m OMNIPAQUE IOHEXOL 300 MG/ML  SOLN COMPARISON:  04/08/2016 FINDINGS: Lower chest: No acute abnormality. Hepatobiliary: Scattered liver cysts are identified. Previous cholecystectomy. No significant biliary dilatation. Pancreas: Pancreas divisum anatomy is suspected.  No main duct dilatation, inflammation or mass. Spleen: Spleen is unremarkable Adrenals/Urinary Tract: The adrenal glands are normal. No kidney mass or hydronephrosis. Urinary bladder is unremarkable. Stomach/Bowel: Stomach is unremarkable. Proximal small bowel loops appear normal within the lower abdomen and pelvis there are multiple loops of small bowel which are increased in caliber measuring up to 2.3 cm with air-fluid levels. Gradual transition to normal to decreased caliber distal small bowel noted. There is abnormal wall thickening with surrounding fat stranding  involving the descending colon and sigmoid colon concerning for colitis. No pneumatosis or bowel perforation. Vascular/Lymphatic: Aortic atherosclerosis. No aneurysm. No abdominopelvic adenopathy. Reproductive: Status post hysterectomy. No adnexal masses. Other: Trace free fluid noted within the pelvis. Musculoskeletal: No acute or significant osseous findings. IMPRESSION: 1. Abnormal wall thickening and surrounding fat stranding involving the descending colon and sigmoid colon compatible with colitis. No pneumatosis or bowel perforation. 2. Mildly dilated loops of small bowel with air-fluid levels. Findings may reflect ileus or partial small bowel obstruction. 3. Pancreas divisum anatomy. Aortic Atherosclerosis (ICD10-I70.0). Electronically Signed   By: Kerby Moors M.D.   On: 09/04/2019 17:36    IMPRESSION/PLAN:  63.  74 year old female with past medical history of diverticular disease status post sigmoid colectomy 2010, chronic constipation and past partial small bowel obstruction presents with nausea, vomiting and lower abdominal pain, primarily LLQ pain.  CTAP 2/4 identified colitis to the descending and sigmoid colon and mildly dilated loops of small bowel concerning for an ileus or partial small bowel obstruction.  Patient declined NG tube for decompression.  She is afebrile.  WBC 6.7. -N.p.o. -Surgical consult discussed with Dr. Lonny Prude -Continue IV fluids -Ondansetron 4 mg IV every 6 hours -Pantoprazole 40 mg IV every 24 hours -Colonoscopy deferred in the setting of a possible small bowel obstruction.  She will require an eventual diagnostic colonoscopy to rule out     inflammatory  versus ischemic colitis. -Lactic acid level if not already checked -Pain management per the hospitalist  2.  History of peripheral arterial disease status post left iliac artery PTCA and stent placement 09/2018 -Hold Plavix for now  3.  History of GERD, asymptomatic -Continue PPI  4. HTN   Further  recommendations per Dr. Corena Pilgrim Dorathy Daft  09/05/2019, 8:20 AM

## 2019-09-05 NOTE — Progress Notes (Signed)
PROGRESS NOTE    Leah [name]  [id number] DOB: 1946-01-22 DOA: 09/04/2019 PCP: [name], DO   Brief Narrative: Leah [name] is a 74 y.o. female with medical history significant for small bowel obstruction, peripheral artery disease, hypertension, hyperlipidemia, diverticulitis, chronic gastritis, chronic constipation, GERD. Patient presented secondary to abdominal pain and an abnormal CT abdomen/pelvis concerning for partial SBO vs ileus and colitis. GI consulted.   Assessment & Plan:   Principal Problem:   SBO (small bowel obstruction) (HCC) Active Problems:   HLD (hyperlipidemia)   GERD (gastroesophageal reflux disease)   Abdominal pain   HTN (hypertension)   Colitis   Colitis Unsure of etiology. GI consulted. Antibiotics not started. Patient is afebrile and has no leukocytosis. Still with abdominal pain mainly in LLQ but also generalized. -GI recommendations: IV fluids, Zofran IV, Protonix IV, hold Plavix, deferring colonoscopy for now - Lactic acid  Possible partial small bowel obstruction CT scan suggests obstruction vs ileus. Clinically, patient did have some nausea/vomiting which has now resolved. She has a history of multiple abdominal surgeries. She is passing gas. No recent bowel movement, but normally she has one every 4+ days. Abdominal x-ray this morning shows no evidence of obstruction. Discussed with GI who request surgery input if warranted. -Continue IV fluids for now while NPO  GERD Patient is on Pepcid as an outpatient. Started on Protonix IV -Continue Protonix  Essential hypertension Patient is on lisinopril as an outpatient. BP mostly controled -Hold antihypertensives for now  PAD Patient is s/p stent placement in March 2020. She is on Plavix and aspirin as an outpatient. -Continue to hold Plavix as recommended by GI -Aspirin on hold as well  Hyperlipidemia Patient is on Lipitor and Zetia as an outpatient -Will continue  Lipitor/Zetia once able to eat  Tobacco use Counseled on admission  Depression Anxiety On Zoloft and Ativan as an outpatient. Held secondary to NPO status.   DVT prophylaxis: SCDs Code Status:   Code Status: Full Code Family Communication: None at bedside Disposition Plan: Discharge pending GI recommendations   Consultants:   [facility]  Procedures:   None  Antimicrobials:  None    Subjective: Abdominal pain. Abdominal pain persistent. Passing gas. No nausea/vomiting. No bowel movement. Hungry.  Objective: Vitals:   09/05/19 0015 09/05/19 0037 09/05/19 0452 09/05/19 0958  BP: 123/62 (!) 160/68 134/81 134/81  Pulse: 61 63 87 87  Resp:  18 20 20   Temp:  97.7 F (36.5 C) 98.2 F (36.8 C) 98.2 F (36.8 C)  TempSrc:  Oral  Oral  SpO2: 100% 95% 92%   Weight:    50.3 kg  Height:    5' 0.98" (1.549 m)    Intake/Output Summary (Last 24 hours) at 09/05/2019 1136 Last data filed at 09/05/2019 [id number] Gross per 24 hour  Intake 0 ml  Output -  Net 0 ml   Filed Weights   09/05/19 0958  Weight: 50.3 kg    Examination:  General exam: Appears calm and comfortable Respiratory system: Clear to auscultation. Respiratory effort normal. Cardiovascular system: S1 & S2 heard, RRR. No murmurs, rubs, gallops or clicks. Gastrointestinal system: Abdomen is mildly distended, soft and diffusely tender most notably in the LLQ. Decreased bowel sounds heard. Central nervous system: Alert and oriented. No focal neurological deficits. Extremities: No edema. No calf tenderness Skin: No cyanosis. No rashes Psychiatry: Judgement and insight appear normal. Mood & affect appropriate.     Data Reviewed: I have personally reviewed following  labs and imaging studies  CBC: Recent Labs  Lab 09/04/19 1438 09/05/19 0054  WBC 6.7 6.4  NEUTROABS 4.2 3.5  HGB 13.8 13.3  HCT 42.1 42.4  MCV 92.6 97.0  PLT 251.0 [id number]   Basic Metabolic Panel: Recent Labs  Lab 09/04/19 1438 09/05/19 0054   NA 137 137  K 4.3 3.9  CL 102 106  CO2 29 24  GLUCOSE 101* 88  BUN 12 10  CREATININE 0.79 0.73  CALCIUM 9.2 8.3*  MG  --  2.0   GFR: Estimated Creatinine Clearance: 48.7 mL/min (by C-G formula based on SCr of 0.73 mg/dL). Liver Function Tests: Recent Labs  Lab 09/04/19 1438 09/05/19 0054  AST 11 17  ALT 8 10  ALKPHOS 73 64  BILITOT 0.3 0.4  PROT 6.8 6.2*  ALBUMIN 4.2 3.6   Recent Labs  Lab 09/05/19 0054  LIPASE 26   No results for input(s): AMMONIA in the last 168 hours. Coagulation Profile: No results for input(s): INR, PROTIME in the last 168 hours. Cardiac Enzymes: No results for input(s): CKTOTAL, CKMB, CKMBINDEX, TROPONINI in the last 168 hours. BNP (last 3 results) No results for input(s): PROBNP in the last 8760 hours. HbA1C: No results for input(s): HGBA1C in the last 72 hours. CBG: No results for input(s): GLUCAP in the last 168 hours. Lipid Profile: No results for input(s): CHOL, HDL, LDLCALC, TRIG, CHOLHDL, LDLDIRECT in the last 72 hours. Thyroid Function Tests: Recent Labs    09/05/19 0054  TSH 3.976   Anemia Panel: No results for input(s): VITAMINB12, FOLATE, FERRITIN, TIBC, IRON, RETICCTPCT in the last 72 hours. Sepsis Labs: No results for input(s): PROCALCITON, LATICACIDVEN in the last 168 hours.  Recent Results (from the past 240 hour(s))  SARS CORONAVIRUS 2 (TAT 6-24 HRS) Nasopharyngeal Nasopharyngeal Swab     Status: None   Collection Time: 09/04/19 10:28 PM   Specimen: Nasopharyngeal Swab  Result Value Ref Range Status   SARS Coronavirus 2 NEGATIVE NEGATIVE Final    Comment: (NOTE) SARS-CoV-2 target nucleic acids are NOT DETECTED. The SARS-CoV-2 RNA is generally detectable in upper and lower respiratory specimens during the acute phase of infection. Negative results do not preclude SARS-CoV-2 infection, do not rule out co-infections with other pathogens, and should not be used as the sole basis for treatment or other patient  management decisions. Negative results must be combined with clinical observations, patient history, and epidemiological information. The expected result is Negative. Fact Sheet for Patients: SugarRoll.be Fact Sheet for Healthcare Providers: https://www.woods-mathews.com/ This test is not yet approved or cleared by the [place] FDA and  has been authorized for detection and/or diagnosis of SARS-CoV-2 by FDA under an Emergency Use Authorization (EUA). This EUA will remain  in effect (meaning this test can be used) for the duration of the COVID-19 declaration under Section 56 4(b)(1) of the Act, 21 U.S.C. section 360bbb-3(b)(1), unless the authorization is terminated or revoked sooner. Performed at [facility], [place] [address], [place], [place] [address]          Radiology Studies: CT Abdomen Pelvis W Contrast  Result Date: 09/04/2019 CLINICAL DATA:  Abdominal pain, chills. EXAM: CT ABDOMEN AND PELVIS WITH CONTRAST TECHNIQUE: Multidetector CT imaging of the abdomen and pelvis was performed using the standard protocol following bolus administration of intravenous contrast. CONTRAST:  164mL OMNIPAQUE IOHEXOL 300 MG/ML  SOLN COMPARISON:  04/08/2016 FINDINGS: Lower chest: No acute abnormality. Hepatobiliary: Scattered liver cysts are identified. Previous cholecystectomy. No significant biliary dilatation.  Pancreas: Pancreas divisum anatomy is suspected. No main duct dilatation, inflammation or mass. Spleen: Spleen is unremarkable Adrenals/Urinary Tract: The adrenal glands are normal. No kidney mass or hydronephrosis. Urinary bladder is unremarkable. Stomach/Bowel: Stomach is unremarkable. Proximal small bowel loops appear normal within the lower abdomen and pelvis there are multiple loops of small bowel which are increased in caliber measuring up to 2.3 cm with air-fluid levels. Gradual transition to normal to decreased caliber distal small bowel  noted. There is abnormal wall thickening with surrounding fat stranding involving the descending colon and sigmoid colon concerning for colitis. No pneumatosis or bowel perforation. Vascular/Lymphatic: Aortic atherosclerosis. No aneurysm. No abdominopelvic adenopathy. Reproductive: Status post hysterectomy. No adnexal masses. Other: Trace free fluid noted within the pelvis. Musculoskeletal: No acute or significant osseous findings. IMPRESSION: 1. Abnormal wall thickening and surrounding fat stranding involving the descending colon and sigmoid colon compatible with colitis. No pneumatosis or bowel perforation. 2. Mildly dilated loops of small bowel with air-fluid levels. Findings may reflect ileus or partial small bowel obstruction. 3. Pancreas divisum anatomy. Aortic Atherosclerosis (ICD10-I70.0). Electronically Signed   By: Kerby Moors M.D.   On: 09/04/2019 17:36   DG Abd 2 Views  Result Date: 09/05/2019 CLINICAL DATA:  Small-bowel obstruction EXAM: ABDOMEN - 2 VIEW COMPARISON:  04/11/2016, 09/04/2019 FINDINGS: Supine and left lateral decubitus radiographs of the abdomen are obtained. Oral contrast administered for preceding CT has progressed throughout the colon. No evidence of small-bowel obstruction. The wall thickening of the distal colon seen on CT is not well appreciated by radiograph. No masses or abnormal calcifications. Left iliac stent unchanged. No free gas within the peritoneal cavity. IMPRESSION: 1. No evidence small bowel obstruction. 2. Please refer to preceding CT abdomen report describing distal colonic wall thickening. Electronically Signed   By: Randa Ngo M.D.   On: 09/05/2019 08:32        Scheduled Meds: . [START ON 09/08/2019] estradiol  0.1 mg Transdermal Q Mon  . pantoprazole (PROTONIX) IV  40 mg Intravenous Q24H  . sodium chloride flush  3 mL Intravenous Q12H   Continuous Infusions: . sodium chloride 1,000 mL (09/05/19 0954)     LOS: 1 day     Cordelia Poche, MD  [facility] 09/05/2019, 11:36 AM  If 7PM-7AM, please contact night-coverage [email or web address]

## 2019-09-06 ENCOUNTER — Inpatient Hospital Stay (HOSPITAL_COMMUNITY): Payer: Medicare HMO

## 2019-09-06 DIAGNOSIS — K529 Noninfective gastroenteritis and colitis, unspecified: Secondary | ICD-10-CM

## 2019-09-06 LAB — COMPREHENSIVE METABOLIC PANEL
ALT: 41 U/L (ref 0–44)
AST: 39 U/L (ref 15–41)
Albumin: 3.3 g/dL — ABNORMAL LOW (ref 3.5–5.0)
Alkaline Phosphatase: 77 U/L (ref 38–126)
Anion gap: 5 (ref 5–15)
BUN: 10 mg/dL (ref 8–23)
CO2: 24 mmol/L (ref 22–32)
Calcium: 7.9 mg/dL — ABNORMAL LOW (ref 8.9–10.3)
Chloride: 109 mmol/L (ref 98–111)
Creatinine, Ser: 0.7 mg/dL (ref 0.44–1.00)
GFR calc Af Amer: >60 mL/min (ref >60)
GFR calc non Af Amer: >60 mL/min (ref >60)
Glucose, Bld: 104 mg/dL — ABNORMAL HIGH (ref 70–99)
Potassium: 3.9 mmol/L (ref 3.5–5.1)
Sodium: 138 mmol/L (ref 135–145)
Total Bilirubin: 0.6 mg/dL (ref 0.3–1.2)
Total Protein: 5.8 g/dL — ABNORMAL LOW (ref 6.5–8.1)

## 2019-09-06 LAB — CBC WITH DIFFERENTIAL/PLATELET
Abs Immature Granulocytes: 0.01 10*3/uL (ref 0.00–0.07)
Basophils Absolute: 0 10*3/uL (ref 0.0–0.1)
Basophils Relative: 1 %
Eosinophils Absolute: 0.4 10*3/uL (ref 0.0–0.5)
Eosinophils Relative: 7 %
HCT: 34.5 % — ABNORMAL LOW (ref 36.0–46.0)
Hemoglobin: 11.2 g/dL — ABNORMAL LOW (ref 12.0–15.0)
Immature Granulocytes: 0 %
Lymphocytes Relative: 30 %
Lymphs Abs: 1.7 10*3/uL (ref 0.7–4.0)
MCH: 31.1 pg (ref 26.0–34.0)
MCHC: 32.5 g/dL (ref 30.0–36.0)
MCV: 95.8 fL (ref 80.0–100.0)
Monocytes Absolute: 0.4 10*3/uL (ref 0.1–1.0)
Monocytes Relative: 8 %
Neutro Abs: 3.1 10*3/uL (ref 1.7–7.7)
Neutrophils Relative %: 54 %
Platelets: 169 10*3/uL (ref 150–400)
RBC: 3.6 MIL/uL — ABNORMAL LOW (ref 3.87–5.11)
RDW: 13.2 % (ref 11.5–15.5)
WBC: 5.6 10*3/uL (ref 4.0–10.5)
nRBC: 0 % (ref 0.0–0.2)

## 2019-09-06 MED ORDER — ATORVASTATIN CALCIUM 40 MG PO TABS
80.0000 mg | ORAL_TABLET | Freq: Every day | ORAL | Status: DC
  Administered 2019-09-06 – 2019-09-07 (×2): 80 mg via ORAL
  Filled 2019-09-06 (×2): qty 2

## 2019-09-06 MED ORDER — SERTRALINE HCL 100 MG PO TABS
100.0000 mg | ORAL_TABLET | Freq: Every day | ORAL | Status: DC
  Filled 2019-09-06: qty 1

## 2019-09-06 NOTE — Progress Notes (Signed)
PROGRESS NOTE    Leah [name]  [id number] DOB: [birth year] DOA: 09/04/2019 PCP: [name], DO   Brief Narrative: Leah [name] is a 74 y.o. female with medical history significant for small bowel obstruction, peripheral artery disease, hypertension, hyperlipidemia, diverticulitis, chronic gastritis, chronic constipation, GERD. Patient presented secondary to abdominal pain and an abnormal CT abdomen/pelvis concerning for partial SBO vs ileus and colitis. GI consulted.   Assessment & Plan:   Principal Problem:   SBO (small bowel obstruction) (HCC) Active Problems:   HLD (hyperlipidemia)   GERD (gastroesophageal reflux disease)   Abdominal pain   HTN (hypertension)   Colitis   Colitis Unsure of etiology. GI consulted. Antibiotics not started. Patient is afebrile and has no leukocytosis. Still with abdominal pain mainly in LLQ but also generalized. Normal lactic acid. -GI recommendations: IV fluids, Zofran IV, Protonix IV, hold Plavix. Plan for colonoscopy after Plavix washout  Possible partial small bowel obstruction CT scan suggests obstruction vs ileus. Clinically, patient did have some nausea/vomiting which has now resolved. She has a history of multiple abdominal surgeries. She is passing gas. No recent bowel movement, but normally she has one every 4+ days. Abdominal x-ray on 2/5 and repeat on 2/6 not suggestive of obstruction or ileus. Resolved.  GERD Patient is on Pepcid as an outpatient. Started on Protonix IV -Continue Protonix  Essential hypertension Patient is on lisinopril as an outpatient. BP controled -Hold antihypertensives for now  PAD Patient is s/p stent placement in March 2020. She is on Plavix and aspirin as an outpatient. -Continue to hold Plavix as recommended by GI -Aspirin on hold as well  Hyperlipidemia Patient is on Lipitor and Zetia as an outpatient -Continue Lipitor  Tobacco use Counseled on  admission  Depression Anxiety On Zoloft and Ativan as an outpatient -Continue Zoloft and Ativan   DVT prophylaxis: SCDs Code Status:   Code Status: Full Code Family Communication: None at bedside Disposition Plan: Discharge pending GI recommendations   Consultants:   [facility]  Procedures:   None  Antimicrobials:  None    Subjective: Abdominal pain slightly improved. No nausea or vomiting. Passing gas.  Objective: Vitals:   09/05/19 1543 09/05/19 2007 09/06/19 0417 09/06/19 0440  BP:  (!) 116/52 136/63   Pulse:  64 65   Resp:  16 16   Temp:  (!) 97.4 F (36.3 C) 98.1 F (36.7 C)   TempSrc:  Oral    SpO2:  97% 95%   Weight: 52.9 kg   52.4 kg  Height: 5\' 1"  (1.549 m)       Intake/Output Summary (Last 24 hours) at 09/06/2019 1246 Last data filed at 09/05/2019 2300 Gross per 24 hour  Intake 2805.24 ml  Output --  Net 2805.24 ml   Filed Weights   09/05/19 0958 09/05/19 1543 09/06/19 0440  Weight: 50.3 kg 52.9 kg 52.4 kg    Examination:  General exam: Appears calm and comfortable Respiratory system: Clear to auscultation. Respiratory effort normal. Cardiovascular system: S1 & S2 heard, RRR. No murmurs, rubs, gallops or clicks. Gastrointestinal system: Abdomen is slightly distended, soft and tender with guarding. Normal bowel sounds heard. Central nervous system: Alert and oriented. No focal neurological deficits. Extremities: No edema. No calf tenderness Skin: No cyanosis. No rashes Psychiatry: Judgement and insight appear normal. Mood & affect appropriate    Data Reviewed: I have personally reviewed following labs and imaging studies  CBC: Recent Labs  Lab 09/04/19 1438 09/05/19 0054 09/06/19 1221  WBC 6.7 6.4 5.6  NEUTROABS 4.2 3.5 3.1  HGB 13.8 13.3 11.2*  HCT 42.1 42.4 34.5*  MCV 92.6 97.0 95.8  PLT 251.0 198 [id number]   Basic Metabolic Panel: Recent Labs  Lab 09/04/19 1438 09/05/19 0054  NA 137 137  K 4.3 3.9  CL 102 106  CO2 29 24   GLUCOSE 101* 88  BUN 12 10  CREATININE 0.79 0.73  CALCIUM 9.2 8.3*  MG  --  2.0   GFR: Estimated Creatinine Clearance: 48.7 mL/min (by C-G formula based on SCr of 0.73 mg/dL). Liver Function Tests: Recent Labs  Lab 09/04/19 1438 09/05/19 0054  AST 11 17  ALT 8 10  ALKPHOS 73 64  BILITOT 0.3 0.4  PROT 6.8 6.2*  ALBUMIN 4.2 3.6   Recent Labs  Lab 09/05/19 0054  LIPASE 26   No results for input(s): AMMONIA in the last 168 hours. Coagulation Profile: No results for input(s): INR, PROTIME in the last 168 hours. Cardiac Enzymes: No results for input(s): CKTOTAL, CKMB, CKMBINDEX, TROPONINI in the last 168 hours. BNP (last 3 results) No results for input(s): PROBNP in the last 8760 hours. HbA1C: No results for input(s): HGBA1C in the last 72 hours. CBG: No results for input(s): GLUCAP in the last 168 hours. Lipid Profile: No results for input(s): CHOL, HDL, LDLCALC, TRIG, CHOLHDL, LDLDIRECT in the last 72 hours. Thyroid Function Tests: Recent Labs    09/05/19 0054  TSH 3.976   Anemia Panel: No results for input(s): VITAMINB12, FOLATE, FERRITIN, TIBC, IRON, RETICCTPCT in the last 72 hours. Sepsis Labs: Recent Labs  Lab 09/05/19 1210  LATICACIDVEN 0.8    Recent Results (from the past 240 hour(s))  SARS CORONAVIRUS 2 (TAT 6-24 HRS) Nasopharyngeal Nasopharyngeal Swab     Status: None   Collection Time: 09/04/19 10:28 PM   Specimen: Nasopharyngeal Swab  Result Value Ref Range Status   SARS Coronavirus 2 NEGATIVE NEGATIVE Final    Comment: (NOTE) SARS-CoV-2 target nucleic acids are NOT DETECTED. The SARS-CoV-2 RNA is generally detectable in upper and lower respiratory specimens during the acute phase of infection. Negative results do not preclude SARS-CoV-2 infection, do not rule out co-infections with other pathogens, and should not be used as the sole basis for treatment or other patient management decisions. Negative results must be combined with clinical  observations, patient history, and epidemiological information. The expected result is Negative. Fact Sheet for Patients: SugarRoll.be Fact Sheet for Healthcare Providers: https://www.woods-mathews.com/ This test is not yet approved or cleared by the [place] FDA and  has been authorized for detection and/or diagnosis of SARS-CoV-2 by FDA under an Emergency Use Authorization (EUA). This EUA will remain  in effect (meaning this test can be used) for the duration of the COVID-19 declaration under Section 56 4(b)(1) of the Act, 21 U.S.C. section 360bbb-3(b)(1), unless the authorization is terminated or revoked sooner. Performed at [facility], [place] [address], [place], [place] [address]          Radiology Studies: CT Abdomen Pelvis W Contrast  Result Date: 09/04/2019 CLINICAL DATA:  Abdominal pain, chills. EXAM: CT ABDOMEN AND PELVIS WITH CONTRAST TECHNIQUE: Multidetector CT imaging of the abdomen and pelvis was performed using the standard protocol following bolus administration of intravenous contrast. CONTRAST:  148mL OMNIPAQUE IOHEXOL 300 MG/ML  SOLN COMPARISON:  04/08/2016 FINDINGS: Lower chest: No acute abnormality. Hepatobiliary: Scattered liver cysts are identified. Previous cholecystectomy. No significant biliary dilatation. Pancreas: Pancreas divisum anatomy is suspected. No main duct  dilatation, inflammation or mass. Spleen: Spleen is unremarkable Adrenals/Urinary Tract: The adrenal glands are normal. No kidney mass or hydronephrosis. Urinary bladder is unremarkable. Stomach/Bowel: Stomach is unremarkable. Proximal small bowel loops appear normal within the lower abdomen and pelvis there are multiple loops of small bowel which are increased in caliber measuring up to 2.3 cm with air-fluid levels. Gradual transition to normal to decreased caliber distal small bowel noted. There is abnormal wall thickening with surrounding fat  stranding involving the descending colon and sigmoid colon concerning for colitis. No pneumatosis or bowel perforation. Vascular/Lymphatic: Aortic atherosclerosis. No aneurysm. No abdominopelvic adenopathy. Reproductive: Status post hysterectomy. No adnexal masses. Other: Trace free fluid noted within the pelvis. Musculoskeletal: No acute or significant osseous findings. IMPRESSION: 1. Abnormal wall thickening and surrounding fat stranding involving the descending colon and sigmoid colon compatible with colitis. No pneumatosis or bowel perforation. 2. Mildly dilated loops of small bowel with air-fluid levels. Findings may reflect ileus or partial small bowel obstruction. 3. Pancreas divisum anatomy. Aortic Atherosclerosis (ICD10-I70.0). Electronically Signed   By: Kerby Moors M.D.   On: 09/04/2019 17:36   DG Abd 2 Views  Result Date: 09/06/2019 CLINICAL DATA:  Constipation, acute generalized abdominal pain. EXAM: ABDOMEN - 2 VIEW COMPARISON:  September 05, 2019. FINDINGS: The bowel gas pattern is normal. Residual contrast is noted throughout the colon. There is no evidence of free air. No radio-opaque calculi or other significant radiographic abnormality is seen. IMPRESSION: No evidence of bowel obstruction or ileus. Electronically Signed   By: Marijo Conception M.D.   On: 09/06/2019 11:40   DG Abd 2 Views  Result Date: 09/05/2019 CLINICAL DATA:  Small-bowel obstruction EXAM: ABDOMEN - 2 VIEW COMPARISON:  04/11/2016, 09/04/2019 FINDINGS: Supine and left lateral decubitus radiographs of the abdomen are obtained. Oral contrast administered for preceding CT has progressed throughout the colon. No evidence of small-bowel obstruction. The wall thickening of the distal colon seen on CT is not well appreciated by radiograph. No masses or abnormal calcifications. Left iliac stent unchanged. No free gas within the peritoneal cavity. IMPRESSION: 1. No evidence small bowel obstruction. 2. Please refer to preceding CT  abdomen report describing distal colonic wall thickening. Electronically Signed   By: Randa Ngo M.D.   On: 09/05/2019 08:32        Scheduled Meds: . [START ON 09/08/2019] estradiol  0.1 mg Transdermal Q Mon  . pantoprazole (PROTONIX) IV  40 mg Intravenous Q24H  . sodium chloride flush  3 mL Intravenous Q12H   Continuous Infusions: . sodium chloride 1,000 mL (09/06/19 0948)     LOS: 2 days     Cordelia Poche, MD [facility] 09/06/2019, 12:46 PM  If 7PM-7AM, please contact night-coverage [email or web address]

## 2019-09-06 NOTE — Progress Notes (Addendum)
[facility] Progress Note  CC:  N/V, possible SBO, colitis   Subjective: No nausea or vomiting. She is having lower abdominal pain RLQ to central lower abdomen. She is tolerating clear liquids. No BM this morning. Passing gas per the rectum.   Objective:  Vital signs in last 24 hours: Temp:  [97.4 F (36.3 C)-98.4 F (36.9 C)] 98.1 F (36.7 C) (02/06 0417) Pulse Rate:  [61-87] 65 (02/06 0417) Resp:  [16-20] 16 (02/06 0417) BP: (116-136)/(52-81) 136/63 (02/06 0417) SpO2:  [95 %-98 %] 95 % (02/06 0417) Weight:  [50.3 kg-52.9 kg] 52.4 kg (02/06 0440) Last BM Date: 09/03/04 General:   Alert 74 year old female in NAD. Heart: RRR, no murmur.  Pulm:  Breath sounds clear throughout.  Abdomen: Soft, moderate tenderness RLQ and central lower abdomen without rebound or guarding. Hypoactive BS x 4 quads.  Extremities:  Without edema. Neurologic:  Alert and  oriented x4;  grossly normal neurologically. Psych:  Alert and cooperative. Normal mood and affect.  Intake/Output from previous day: 02/05 0701 - 02/06 0700 In: 2805.2 [I.V.:2805.2] Out: -  Intake/Output this shift: Total I/O In: 3 [I.V.:3] Out: -   Lab Results: Recent Labs    09/04/19 1438 09/05/19 0054  WBC 6.7 6.4  HGB 13.8 13.3  HCT 42.1 42.4  PLT 251.0 198   BMET Recent Labs    09/04/19 1438 09/05/19 0054  NA 137 137  K 4.3 3.9  CL 102 106  CO2 29 24  GLUCOSE 101* 88  BUN 12 10  CREATININE 0.79 0.73  CALCIUM 9.2 8.3*   LFT Recent Labs    09/05/19 0054  PROT 6.2*  ALBUMIN 3.6  AST 17  ALT 10  ALKPHOS 64  BILITOT 0.4   PT/INR No results for input(s): LABPROT, INR in the last 72 hours. Hepatitis Panel No results for input(s): HEPBSAG, HCVAB, HEPAIGM, HEPBIGM in the last 72 hours.  CT Abdomen Pelvis W Contrast  Result Date: 09/04/2019 CLINICAL DATA:  Abdominal pain, chills. EXAM: CT ABDOMEN AND PELVIS WITH CONTRAST TECHNIQUE: Multidetector CT imaging of the abdomen and pelvis  was performed using the standard protocol following bolus administration of intravenous contrast. CONTRAST:  14mL OMNIPAQUE IOHEXOL 300 MG/ML  SOLN COMPARISON:  04/08/2016 FINDINGS: Lower chest: No acute abnormality. Hepatobiliary: Scattered liver cysts are identified. Previous cholecystectomy. No significant biliary dilatation. Pancreas: Pancreas divisum anatomy is suspected. No main duct dilatation, inflammation or mass. Spleen: Spleen is unremarkable Adrenals/Urinary Tract: The adrenal glands are normal. No kidney mass or hydronephrosis. Urinary bladder is unremarkable. Stomach/Bowel: Stomach is unremarkable. Proximal small bowel loops appear normal within the lower abdomen and pelvis there are multiple loops of small bowel which are increased in caliber measuring up to 2.3 cm with air-fluid levels. Gradual transition to normal to decreased caliber distal small bowel noted. There is abnormal wall thickening with surrounding fat stranding involving the descending colon and sigmoid colon concerning for colitis. No pneumatosis or bowel perforation. Vascular/Lymphatic: Aortic atherosclerosis. No aneurysm. No abdominopelvic adenopathy. Reproductive: Status post hysterectomy. No adnexal masses. Other: Trace free fluid noted within the pelvis. Musculoskeletal: No acute or significant osseous findings. IMPRESSION: 1. Abnormal wall thickening and surrounding fat stranding involving the descending colon and sigmoid colon compatible with colitis. No pneumatosis or bowel perforation. 2. Mildly dilated loops of small bowel with air-fluid levels. Findings may reflect ileus or partial small bowel obstruction. 3. Pancreas divisum anatomy. Aortic Atherosclerosis (ICD10-I70.0). Electronically Signed   By: Queen Slough.D.  On: 09/04/2019 17:36   DG Abd 2 Views  Result Date: 09/05/2019 CLINICAL DATA:  Small-bowel obstruction EXAM: ABDOMEN - 2 VIEW COMPARISON:  04/11/2016, 09/04/2019 FINDINGS: Supine and left lateral  decubitus radiographs of the abdomen are obtained. Oral contrast administered for preceding CT has progressed throughout the colon. No evidence of small-bowel obstruction. The wall thickening of the distal colon seen on CT is not well appreciated by radiograph. No masses or abnormal calcifications. Left iliac stent unchanged. No free gas within the peritoneal cavity. IMPRESSION: 1. No evidence small bowel obstruction. 2. Please refer to preceding CT abdomen report describing distal colonic wall thickening. Electronically Signed   By: Randa Ngo M.D.   On: 09/05/2019 08:32    Assessment / Plan:  66.  74 year old female with past medical history of diverticular disease status post sigmoid colectomy 2010, chronic constipation and past partial small bowel obstruction presents with nausea, vomiting and lower abdominal pain, primarily LLQ pain.  CTAP 2/4 identified colitis to the descending and sigmoid colon and mildly dilated loops of small bowel concerning for an ileus or partial small bowel obstruction. Patient declined NG tube for decompression.  General surgery PA-C reviewed her CTAP and abdominal xray 2/5 without concern for SBO. She is afebrile. She c/o RLQ to central lower abdominal pain this morning. She is afebrile.  -CBC, CMP  -consider repeat abd/pelvic CT,  Await input from Dr. Rush Landmark.  -Diagnostic colonoscopy after Plavix washout  -Phenergan IV Q 8 hours. Zofran discontinued to reduce risk of constipation. -Pain management per the hospitalist  -Continue IVF -Clear liquid diet  2.  History of peripheral arterial disease status post left iliac artery PTCA and stent placement 09/2018 -Hold Plavix for now -Plan for a diagnostic colonoscopy after Plavix washout   3.  History of GERD, asymptomatic -Continue PPI  4. HTN   Principal Problem:   SBO (small bowel obstruction) (HCC) Active Problems:   HLD (hyperlipidemia)   GERD (gastroesophageal reflux disease)   Abdominal pain    HTN (hypertension)   Colitis     LOS: 2 days   Noralyn Pick  09/06/2019, 5:58 AM     Attending 30 Attestation   I have taken an interval history, reviewed the chart and examined the patient.   Patient seems to be doing okay with clear liquids.  Lets see how she is doing tomorrow.  If she is tolerating clear liquids and feeling better then we may move forward with prepping her for a colonoscopy later this week.  If she is able to give any further stool studies now that she is having some bowel movements, then we can rule out an infectious etiology for her colitis though I think it is very unlikely.  We will see how she doing tomorrow and move forward from there.  I agree with the Advanced Practitioner's note, impression, and recommendations with updates and my documentation above.   Justice Britain, MD [facility]oscopy Office # [phone]

## 2019-09-07 DIAGNOSIS — R197 Diarrhea, unspecified: Secondary | ICD-10-CM

## 2019-09-07 MED ORDER — PEG-KCL-NACL-NASULF-NA ASC-C 100 G PO SOLR
1.0000 | Freq: Once | ORAL | Status: AC
  Administered 2019-09-07: 200 g via ORAL
  Filled 2019-09-07: qty 1

## 2019-09-07 MED ORDER — METOCLOPRAMIDE HCL 5 MG/ML IJ SOLN
10.0000 mg | Freq: Once | INTRAMUSCULAR | Status: AC
  Administered 2019-09-07: 10 mg via INTRAVENOUS
  Filled 2019-09-07: qty 2

## 2019-09-07 MED ORDER — LISINOPRIL 20 MG PO TABS
20.0000 mg | ORAL_TABLET | Freq: Every day | ORAL | Status: DC
  Administered 2019-09-07 – 2019-09-09 (×3): 20 mg via ORAL
  Filled 2019-09-07 (×3): qty 1

## 2019-09-07 MED ORDER — LORAZEPAM 0.5 MG PO TABS
0.5000 mg | ORAL_TABLET | Freq: Every evening | ORAL | Status: DC | PRN
  Administered 2019-09-07 – 2019-09-08 (×2): 0.5 mg via ORAL
  Filled 2019-09-07 (×2): qty 1

## 2019-09-07 MED ORDER — SERTRALINE HCL 25 MG PO TABS
25.0000 mg | ORAL_TABLET | Freq: Every day | ORAL | Status: DC
  Administered 2019-09-07 – 2019-09-08 (×2): 25 mg via ORAL
  Filled 2019-09-07 (×2): qty 1

## 2019-09-07 MED ORDER — ATORVASTATIN CALCIUM 40 MG PO TABS
40.0000 mg | ORAL_TABLET | Freq: Every day | ORAL | Status: DC
  Administered 2019-09-08 – 2019-09-09 (×2): 40 mg via ORAL
  Filled 2019-09-07 (×2): qty 1

## 2019-09-07 NOTE — Progress Notes (Signed)
[facility] Progress Note  CC:  Abdominal Pain, Colitis   Subjective:  No BM today. Slight lower abdominal discomfort, does not need pain med. She is passing gas per the rectum. No N/V.    Objective:  Vital signs in last 24 hours: Temp:  [98 F (36.7 C)-98.5 F (36.9 C)] 98.5 F (36.9 C) (02/07 0605) Pulse Rate:  [55-60] 60 (02/07 0605) Resp:  [15-20] 18 (02/07 0605) BP: (150-155)/(52-78) 150/59 (02/07 0605) SpO2:  [97 %-100 %] 97 % (02/07 0605) Weight:  [55.2 kg] 55.2 kg (02/07 0605) Last BM Date: 09/06/19 General:   Alert,  Well-developed, in NAD. Heart: RRR, no murmur. Pulm:  Breath sounds clear throughout.  Abdomen: Flat, soft, mild lower abdominal tenderness RLQ to central mid lower abdomen without rebound or guarding. Lower midline scar intact. + BS x 4 quads.  Extremities:  Without edema. Neurologic:  Alert and  oriented x4;  grossly normal neurologically. Psych:  Alert and cooperative. Normal mood and affect.  Intake/Output from previous day: 02/06 0701 - 02/07 0700 In: 4852.4 [P.O.:360; I.V.:4492.4] Out: -  Intake/Output this shift: No intake/output data recorded.  Lab Results: Recent Labs    09/04/19 1438 09/05/19 0054 09/06/19 1221  WBC 6.7 6.4 5.6  HGB 13.8 13.3 11.2*  HCT 42.1 42.4 34.5*  PLT 251.0 198 169   BMET Recent Labs    09/04/19 1438 09/05/19 0054 09/06/19 1221  NA 137 137 138  K 4.3 3.9 3.9  CL 102 106 109  CO2 29 24 24   GLUCOSE 101* 88 104*  BUN 12 10 10   CREATININE 0.79 0.73 0.70  CALCIUM 9.2 8.3* 7.9*   LFT Recent Labs    09/06/19 1221  PROT 5.8*  ALBUMIN 3.3*  AST 39  ALT 41  ALKPHOS 77  BILITOT 0.6   PT/INR No results for input(s): LABPROT, INR in the last 72 hours. Hepatitis Panel No results for input(s): HEPBSAG, HCVAB, HEPAIGM, HEPBIGM in the last 72 hours.  DG Abd 2 Views  Result Date: 09/06/2019 CLINICAL DATA:  Constipation, acute generalized abdominal pain. EXAM: ABDOMEN - 2 VIEW  COMPARISON:  September 05, 2019. FINDINGS: The bowel gas pattern is normal. Residual contrast is noted throughout the colon. There is no evidence of free air. No radio-opaque calculi or other significant radiographic abnormality is seen. IMPRESSION: No evidence of bowel obstruction or ileus. Electronically Signed   By: Marijo Conception M.D.   On: 09/06/2019 11:40    Assessment / Plan:  48. 74 year old female with past medical history of diverticular disease status post sigmoid colectomy 2010,chronic constipation and past partial small bowel obstructionpresentswith nausea, vomiting and lower abdominal pain, primarily LLQ pain.CTAP2/4 identified colitis to the descending and sigmoid colon and mildly dilated loops of small bowel concerning for an ileus or partial small bowel obstruction. Patient declined NG tube for decompression.  General surgery PA-C reviewed her CTAP and abdominal xray 2/5 without concern for SBO. Patient to start 2 day bowel prep today for colonoscopy on Tues 2/8.  -Colonoscopy scheduled for Tues 2/8, benefits and risk discussed including risk with sedation,risk of bleeding, perforation and infection.  -Continue clear liquid diet. Patient to start Movi prep full dose today a 2pm, to sip on throughout the day (fo finish both bottles in kit). -Our GI service will reassess in the morning and will determined if a 2nd day of Movi prep necessary. -Reglan 74m IV prior to starting Movi prep.  -GI pathogen  -C.diff specimen could  not be done as patient has been in the hospital for > 4 days.  -CBC and CMP in am  2. History of peripheral arterial disease status post left iliac artery PTCA and stent placement 09/2018 -HoldPlavix for now, restart instructions to be provided after colonoscopy completed   3. History of GERD, asymptomatic -Continue PPI  4. HTN  Principal Problem:   SBO (small bowel obstruction) (HCC) Active Problems:   HLD (hyperlipidemia)   GERD  (gastroesophageal reflux disease)   Abdominal pain   HTN (hypertension)   Colitis     LOS: 3 days   Leah [name]  09/07/2019, 10:03 AM

## 2019-09-07 NOTE — Progress Notes (Signed)
PROGRESS NOTE    [name]  [id number] DOB: Aug 07, 1945 DOA: 09/04/2019 PCP: [name], DO   Brief Narrative: Leah [name] is a 74 y.o. female with medical history significant for small bowel obstruction, peripheral artery disease, hypertension, hyperlipidemia, diverticulitis, chronic gastritis, chronic constipation, GERD. Patient presented secondary to abdominal pain and an abnormal CT abdomen/pelvis concerning for partial SBO vs ileus and colitis. GI consulted.   Assessment & Plan:   Principal Problem:   SBO (small bowel obstruction) (HCC) Active Problems:   HLD (hyperlipidemia)   GERD (gastroesophageal reflux disease)   Abdominal pain   HTN (hypertension)   Colitis   Colitis Unsure of etiology. GI consulted. Antibiotics not started. Patient is afebrile and has no leukocytosis. Still with abdominal pain mainly in LLQ but also generalized. Normal lactic acid. -GI recommendations: Zofran IV, Protonix IV, hold Plavix. Plan for colonoscopy after Plavix washout -Will discontinue IV fluids today and watch PO intake  Possible partial small bowel obstruction CT scan suggests obstruction vs ileus. Clinically, patient did have some nausea/vomiting which has now resolved. She has a history of multiple abdominal surgeries. She is passing gas. No recent bowel movement, but normally she has one every 4+ days. Abdominal x-ray on 2/5 and repeat on 2/6 not suggestive of obstruction or ileus. Resolved.  GERD Patient is on Pepcid as an outpatient. Started on Protonix IV -Continue Protonix  Essential hypertension Patient is on lisinopril as an outpatient. BP controled -Resume home lisinopril  PAD Patient is s/p stent placement in March 2020. She is on Plavix and aspirin as an outpatient. -Continue to hold Plavix as recommended by GI -Aspirin on hold as well  Hyperlipidemia Patient is on Lipitor and Zetia as an outpatient -Continue Lipitor  Tobacco  use Counseled on admission  Depression Anxiety On Zoloft and Ativan as an outpatient -Continue Zoloft and Ativan   DVT prophylaxis: SCDs Code Status:   Code Status: Full Code Family Communication: None at bedside Disposition Plan: Discharge pending GI recommendations   Consultants:   [facility]  Procedures:   None  Antimicrobials:  None    Subjective: Abdominal pain stable and still present. No nausea/vomiting.  Objective: Vitals:   09/06/19 0440 09/06/19 1344 09/06/19 2131 09/07/19 0605  BP:  (!) 155/52 (!) 152/78 (!) 150/59  Pulse:  (!) 55 (!) 57 60  Resp:  15 20 18   Temp:  98 F (36.7 C) 98.1 F (36.7 C) 98.5 F (36.9 C)  TempSrc:  Oral Oral Oral  SpO2:   100% 97%  Weight: 52.4 kg   55.2 kg  Height:        Intake/Output Summary (Last 24 hours) at 09/07/2019 0907 Last data filed at 09/07/2019 0459 Gross per 24 hour  Intake 4852.44 ml  Output --  Net 4852.44 ml   Filed Weights   09/05/19 1543 09/06/19 0440 09/07/19 0605  Weight: 52.9 kg 52.4 kg 55.2 kg    Examination:  General exam: Appears calm and comfortable Respiratory system: Clear to auscultation. Respiratory effort normal. Cardiovascular system: S1 & S2 heard, RRR. No murmurs, rubs, gallops or clicks. Gastrointestinal system: Abdomen is mildly distended, soft and tender in periumbilical area with gaurding. Normal bowel sounds heard. Central nervous system: Alert and oriented. No focal neurological deficits. Extremities: No edema. No calf tenderness Skin: No cyanosis. No rashes Psychiatry: Judgement and insight appear normal. Mood & affect appropriate.    Data Reviewed: I have personally reviewed following labs and imaging studies  CBC:  Recent Labs  Lab 09/04/19 1438 09/05/19 0054 09/06/19 1221  WBC 6.7 6.4 5.6  NEUTROABS 4.2 3.5 3.1  HGB 13.8 13.3 11.2*  HCT 42.1 42.4 34.5*  MCV 92.6 97.0 95.8  PLT 251.0 198 [id number]   Basic Metabolic Panel: Recent Labs  Lab 09/04/19 1438  09/05/19 0054 09/06/19 1221  NA 137 137 138  K 4.3 3.9 3.9  CL 102 106 109  CO2 29 24 24   GLUCOSE 101* 88 104*  BUN 12 10 10   CREATININE 0.79 0.73 0.70  CALCIUM 9.2 8.3* 7.9*  MG  --  2.0  --    GFR: Estimated Creatinine Clearance: 48.7 mL/min (by C-G formula based on SCr of 0.7 mg/dL). Liver Function Tests: Recent Labs  Lab 09/04/19 1438 09/05/19 0054 09/06/19 1221  AST 11 17 39  ALT 8 10 41  ALKPHOS 73 64 77  BILITOT 0.3 0.4 0.6  PROT 6.8 6.2* 5.8*  ALBUMIN 4.2 3.6 3.3*   Recent Labs  Lab 09/05/19 0054  LIPASE 26   No results for input(s): AMMONIA in the last 168 hours. Coagulation Profile: No results for input(s): INR, PROTIME in the last 168 hours. Cardiac Enzymes: No results for input(s): CKTOTAL, CKMB, CKMBINDEX, TROPONINI in the last 168 hours. BNP (last 3 results) No results for input(s): PROBNP in the last 8760 hours. HbA1C: No results for input(s): HGBA1C in the last 72 hours. CBG: No results for input(s): GLUCAP in the last 168 hours. Lipid Profile: No results for input(s): CHOL, HDL, LDLCALC, TRIG, CHOLHDL, LDLDIRECT in the last 72 hours. Thyroid Function Tests: Recent Labs    09/05/19 0054  TSH 3.976   Anemia Panel: No results for input(s): VITAMINB12, FOLATE, FERRITIN, TIBC, IRON, RETICCTPCT in the last 72 hours. Sepsis Labs: Recent Labs  Lab 09/05/19 1210  LATICACIDVEN 0.8    Recent Results (from the past 240 hour(s))  SARS CORONAVIRUS 2 (TAT 6-24 HRS) Nasopharyngeal Nasopharyngeal Swab     Status: None   Collection Time: 09/04/19 10:28 PM   Specimen: Nasopharyngeal Swab  Result Value Ref Range Status   SARS Coronavirus 2 NEGATIVE NEGATIVE Final    Comment: (NOTE) SARS-CoV-2 target nucleic acids are NOT DETECTED. The SARS-CoV-2 RNA is generally detectable in upper and lower respiratory specimens during the acute phase of infection. Negative results do not preclude SARS-CoV-2 infection, do not rule out co-infections with other  pathogens, and should not be used as the sole basis for treatment or other patient management decisions. Negative results must be combined with clinical observations, patient history, and epidemiological information. The expected result is Negative. Fact Sheet for Patients: SugarRoll.be Fact Sheet for Healthcare Providers: https://www.woods-mathews.com/ This test is not yet approved or cleared by the [place] FDA and  has been authorized for detection and/or diagnosis of SARS-CoV-2 by FDA under an Emergency Use Authorization (EUA). This EUA will remain  in effect (meaning this test can be used) for the duration of the COVID-19 declaration under Section 56 4(b)(1) of the Act, 21 U.S.C. section 360bbb-3(b)(1), unless the authorization is terminated or revoked sooner. Performed at [facility], [place] [address] [place], [place] [address]          Radiology Studies: DG Abd 2 Views  Result Date: 09/06/2019 CLINICAL DATA:  Constipation, acute generalized abdominal pain. EXAM: ABDOMEN - 2 VIEW COMPARISON:  September 05, 2019. FINDINGS: The bowel gas pattern is normal. Residual contrast is noted throughout the colon. There is no evidence of free air. No radio-opaque calculi  or other significant radiographic abnormality is seen. IMPRESSION: No evidence of bowel obstruction or ileus. Electronically Signed   By: Marijo Conception M.D.   On: 09/06/2019 11:40        Scheduled Meds: . atorvastatin  80 mg Oral Daily  . [START ON 09/08/2019] estradiol  0.1 mg Transdermal Q Mon  . pantoprazole (PROTONIX) IV  40 mg Intravenous Q24H  . sertraline  25 mg Oral QHS  . sodium chloride flush  3 mL Intravenous Q12H   Continuous Infusions:    LOS: 3 days     Cordelia Poche, MD [facility] 09/07/2019, 9:07 AM  If 7PM-7AM, please contact night-coverage [email or web address]

## 2019-09-08 DIAGNOSIS — K5909 Other constipation: Secondary | ICD-10-CM

## 2019-09-08 LAB — CBC WITH DIFFERENTIAL/PLATELET
Abs Immature Granulocytes: 0.01 10*3/uL (ref 0.00–0.07)
Basophils Absolute: 0.1 10*3/uL (ref 0.0–0.1)
Basophils Relative: 1 %
Eosinophils Absolute: 0.4 10*3/uL (ref 0.0–0.5)
Eosinophils Relative: 8 %
HCT: 32.7 % — ABNORMAL LOW (ref 36.0–46.0)
Hemoglobin: 10.7 g/dL — ABNORMAL LOW (ref 12.0–15.0)
Immature Granulocytes: 0 %
Lymphocytes Relative: 32 %
Lymphs Abs: 1.7 10*3/uL (ref 0.7–4.0)
MCH: 30.9 pg (ref 26.0–34.0)
MCHC: 32.7 g/dL (ref 30.0–36.0)
MCV: 94.5 fL (ref 80.0–100.0)
Monocytes Absolute: 0.3 10*3/uL (ref 0.1–1.0)
Monocytes Relative: 6 %
Neutro Abs: 2.7 10*3/uL (ref 1.7–7.7)
Neutrophils Relative %: 53 %
Platelets: 199 10*3/uL (ref 150–400)
RBC: 3.46 MIL/uL — ABNORMAL LOW (ref 3.87–5.11)
RDW: 13.3 % (ref 11.5–15.5)
WBC: 5.2 10*3/uL (ref 4.0–10.5)
nRBC: 0 % (ref 0.0–0.2)

## 2019-09-08 LAB — COMPREHENSIVE METABOLIC PANEL
ALT: 34 U/L (ref 0–44)
AST: 29 U/L (ref 15–41)
Albumin: 3.3 g/dL — ABNORMAL LOW (ref 3.5–5.0)
Alkaline Phosphatase: 67 U/L (ref 38–126)
Anion gap: 5 (ref 5–15)
BUN: <5 mg/dL — ABNORMAL LOW (ref 8–23)
CO2: 26 mmol/L (ref 22–32)
Calcium: 8.4 mg/dL — ABNORMAL LOW (ref 8.9–10.3)
Chloride: 110 mmol/L (ref 98–111)
Creatinine, Ser: 0.69 mg/dL (ref 0.44–1.00)
GFR calc Af Amer: >60 mL/min (ref >60)
GFR calc non Af Amer: >60 mL/min (ref >60)
Glucose, Bld: 87 mg/dL (ref 70–99)
Potassium: 3.5 mmol/L (ref 3.5–5.1)
Sodium: 141 mmol/L (ref 135–145)
Total Bilirubin: 0.5 mg/dL (ref 0.3–1.2)
Total Protein: 5.6 g/dL — ABNORMAL LOW (ref 6.5–8.1)

## 2019-09-08 MED ORDER — METOCLOPRAMIDE HCL 5 MG/ML IJ SOLN
10.0000 mg | Freq: Once | INTRAMUSCULAR | Status: AC
  Administered 2019-09-08: 10 mg via INTRAVENOUS
  Filled 2019-09-08: qty 2

## 2019-09-08 NOTE — Progress Notes (Addendum)
Progress Note   Subjective  Chief Complaint: Abdominal pain, colitis  This morning, the patient tells me that her pain is still there may be a 5/10.  She was able to finish 1 bottle of movie prep yesterday and had at least 5-6 bowel movements, the last which was "watery/yellow".  Plans are for her to have another bowel prep this afternoon.  Does tell me that she got somewhat nauseous but was able to keep the prep down.    Objective   Vital signs in last 24 hours: Temp:  [98 F (36.7 C)-98.1 F (36.7 C)] 98 F (36.7 C) (02/08 0603) Pulse Rate:  [53-72] 72 (02/08 0603) Resp:  [19-20] 20 (02/08 0603) BP: (130-146)/(64-72) 130/64 (02/08 0603) SpO2:  [96 %-100 %] 96 % (02/08 0603) Last BM Date: 09/07/19 General:    white female in NAD Heart:  Regular rate and rhythm; no murmurs Lungs: Respirations even and unlabored, lungs CTA bilaterally Abdomen:  Soft, mild generalized ttp and nondistended. Normal bowel sounds.  Lower midline scar Extremities:  Without edema. Neurologic:  Alert and oriented,  grossly normal neurologically. Psych:  Cooperative. Normal mood and affect.  Intake/Output from previous day: 02/07 0701 - 02/08 0700 In: 597.7 [I.V.:597.7] Out: 800 [Urine:400; Stool:400]  Lab Results: Recent Labs    09/06/19 1221 09/08/19 0516  WBC 5.6 5.2  HGB 11.2* 10.7*  HCT 34.5* 32.7*  PLT 169 199   BMET Recent Labs    09/06/19 1221 09/08/19 0516  NA 138 141  K 3.9 3.5  CL 109 110  CO2 24 26  GLUCOSE 104* 87  BUN 10 <5*  CREATININE 0.70 0.69  CALCIUM 7.9* 8.4*   LFT Recent Labs    09/08/19 0516  PROT 5.6*  ALBUMIN 3.3*  AST 29  ALT 34  ALKPHOS 67  BILITOT 0.5     Assessment / Plan:   Assessment: 1.  Nausea, vomiting, lower abdominal pain: CTAP 2/4 identified colitis of the descending and sigmoid colon and mildly dilated loops of small bowel concerning for an ileus or partial small bowel obstruction, patient declined NG tube for decompression,  general surgery reviewed her CTAP and abdominal x-ray 2/5 without concern for SBO, patient started on 2-day bowel prep yesterday, 09/07/2019-plans for colonoscopy 09/09/2019 2.  History of diverticular disease status post sigmoid colectomy 2010 3.  History of PAD status post left iliac artery PTCA and stent placement: Plavix on hold 4.  History of GERD, asymptomatic 5.  Hypertension  Plan: 1.  Patient scheduled for colonoscopy 09/09/2019 with Dr. Loletha Carrow. 2.  Patient will continue on a clear liquid diet today and finish movie prep this afternoon.  She will get another dose of IV Reglan prior to starting second half of prep. NPO at midnight. 3.  Continue to hold Plavix 4.  Please await further recommendations from Dr. Loletha Carrow later today.  Thank you for kind consultation, we will continue to follow.    LOS: 4 days   Levin Erp  09/08/2019, 12:10 PM   I have discussed the case with the PA, and that is the plan I formulated. I personally interviewed and examined the patient.  CC: Partial small bowel obstruction  While she does have chronic constipation that I believe is related to her tortuous left colon anastomosis and probable impaired motility from rectosigmoid resection for prior diverticulitis, on this occasion I believe she has a PSBO that is slowly improving.  Bowel prep is passing, she is no longer vomiting.  Colonoscopy planned for tomorrow to evaluate reportedly abnormal appearing left colon in the area of prior surgery.  There was reportedly some stranding in that area, so perhaps there is recurrent diverticulitis.  I believe she most likely has intra-abdominal adhesions from multiple prior surgeries, including the complex sigmoid resection that reportedly had poor wound healing for months. In light of that, when she follows up with me after discharge, we will most likely arrange an outpatient surgical consult for consideration of laparoscopy and lysis of adhesions.  Total time 25  minutes   Nelida Meuse III Office: [phone]

## 2019-09-08 NOTE — H&P (View-Only) (Signed)
Progress Note   Subjective  Chief Complaint: Abdominal pain, colitis  This morning, the patient tells me that her pain is still there may be a 5/10.  She was able to finish 1 bottle of movie prep yesterday and had at least 5-6 bowel movements, the last which was "watery/yellow".  Plans are for her to have another bowel prep this afternoon.  Does tell me that she got somewhat nauseous but was able to keep the prep down.    Objective   Vital signs in last 24 hours: Temp:  [98 F (36.7 C)-98.1 F (36.7 C)] 98 F (36.7 C) (02/08 0603) Pulse Rate:  [53-72] 72 (02/08 0603) Resp:  [19-20] 20 (02/08 0603) BP: (130-146)/(64-72) 130/64 (02/08 0603) SpO2:  [96 %-100 %] 96 % (02/08 0603) Last BM Date: 09/07/19 General:    white female in NAD Heart:  Regular rate and rhythm; no murmurs Lungs: Respirations even and unlabored, lungs CTA bilaterally Abdomen:  Soft, mild generalized ttp and nondistended. Normal bowel sounds.  Lower midline scar Extremities:  Without edema. Neurologic:  Alert and oriented,  grossly normal neurologically. Psych:  Cooperative. Normal mood and affect.  Intake/Output from previous day: 02/07 0701 - 02/08 0700 In: 597.7 [I.V.:597.7] Out: 800 [Urine:400; Stool:400]  Lab Results: Recent Labs    09/06/19 1221 09/08/19 0516  WBC 5.6 5.2  HGB 11.2* 10.7*  HCT 34.5* 32.7*  PLT 169 199   BMET Recent Labs    09/06/19 1221 09/08/19 0516  NA 138 141  K 3.9 3.5  CL 109 110  CO2 24 26  GLUCOSE 104* 87  BUN 10 <5*  CREATININE 0.70 0.69  CALCIUM 7.9* 8.4*   LFT Recent Labs    09/08/19 0516  PROT 5.6*  ALBUMIN 3.3*  AST 29  ALT 34  ALKPHOS 67  BILITOT 0.5     Assessment / Plan:   Assessment: 1.  Nausea, vomiting, lower abdominal pain: CTAP 2/4 identified colitis of the descending and sigmoid colon and mildly dilated loops of small bowel concerning for an ileus or partial small bowel obstruction, patient declined NG tube for decompression,  general surgery reviewed her CTAP and abdominal x-ray 2/5 without concern for SBO, patient started on 2-day bowel prep yesterday, 09/07/2019-plans for colonoscopy 09/09/2019 2.  History of diverticular disease status post sigmoid colectomy 2010 3.  History of PAD status post left iliac artery PTCA and stent placement: Plavix on hold 4.  History of GERD, asymptomatic 5.  Hypertension  Plan: 1.  Patient scheduled for colonoscopy 09/09/2019 with Dr. Loletha Carrow. 2.  Patient will continue on a clear liquid diet today and finish movie prep this afternoon.  She will get another dose of IV Reglan prior to starting second half of prep. NPO at midnight. 3.  Continue to hold Plavix 4.  Please await further recommendations from Dr. Loletha Carrow later today.  Thank you for kind consultation, we will continue to follow.    LOS: 4 days   Levin Erp  09/08/2019, 12:10 PM   I have discussed the case with the PA, and that is the plan I formulated. I personally interviewed and examined the patient.  CC: Partial small bowel obstruction  While she does have chronic constipation that I believe is related to her tortuous left colon anastomosis and probable impaired motility from rectosigmoid resection for prior diverticulitis, on this occasion I believe she has a PSBO that is slowly improving.  Bowel prep is passing, she is no longer vomiting.  Colonoscopy planned for tomorrow to evaluate reportedly abnormal appearing left colon in the area of prior surgery.  There was reportedly some stranding in that area, so perhaps there is recurrent diverticulitis.  I believe she most likely has intra-abdominal adhesions from multiple prior surgeries, including the complex sigmoid resection that reportedly had poor wound healing for months. In light of that, when she follows up with me after discharge, we will most likely arrange an outpatient surgical consult for consideration of laparoscopy and lysis of adhesions.  Total time 25  minutes   Nelida Meuse III Office: [phone]

## 2019-09-08 NOTE — Progress Notes (Signed)
PROGRESS NOTE    Leah [name]  [id number] DOB: [birth year] DOA: 09/04/2019 PCP: [name], DO   Brief Narrative: Leah [name] is a 74 y.o. female with medical history significant for small bowel obstruction, peripheral artery disease, hypertension, hyperlipidemia, diverticulitis, chronic gastritis, chronic constipation, GERD. Patient presented secondary to abdominal pain and an abnormal CT abdomen/pelvis concerning for partial SBO vs ileus and colitis. GI consulted.   Assessment & Plan:   Principal Problem:   Colitis Active Problems:   HLD (hyperlipidemia)   GERD (gastroesophageal reflux disease)   Abdominal pain   HTN (hypertension)   Colitis Unsure of etiology. GI consulted. Antibiotics not started. Patient is afebrile and has no leukocytosis. Still with abdominal pain mainly in LLQ but also generalized. Normal lactic acid. -GI recommendations: Zofran IV, Protonix IV, hold Plavix. Plan for colonoscopy after Plavix washout and prep  Possible partial small bowel obstruction CT scan suggests obstruction vs ileus. Clinically, patient did have some nausea/vomiting which has now resolved. She has a history of multiple abdominal surgeries. She is passing gas. No recent bowel movement, but normally she has one every 4+ days. Abdominal x-ray on 2/5 and repeat on 2/6 not suggestive of obstruction or ileus. Resolved.  GERD Patient is on Pepcid as an outpatient. Started on Protonix IV -Continue Protonix  Essential hypertension Patient is on lisinopril as an outpatient. BP controled -Continue lisinopril  PAD Patient is s/p stent placement in March 2020. She is on Plavix and aspirin as an outpatient. -Continue to hold Plavix as recommended by GI -Aspirin on hold as well  Hyperlipidemia Patient is on Lipitor and Zetia as an outpatient -Continue Lipitor  Tobacco use Counseled on admission  Depression Anxiety On Zoloft and Ativan as an  outpatient -Continue Zoloft and Ativan   DVT prophylaxis: SCDs Code Status:   Code Status: Full Code Family Communication: None at bedside Disposition Plan: Discharge pending GI recommendations   Consultants:   [facility]  Procedures:   None  Antimicrobials:  None    Subjective: Still with abdominal pain. No diarrhea.  Objective: Vitals:   09/07/19 0605 09/07/19 1446 09/07/19 2115 09/08/19 0603  BP: (!) 150/59 131/67 (!) 146/72 130/64  Pulse: 60 (!) 53 61 72  Resp: 18 20 19 20   Temp: 98.5 F (36.9 C) 98.1 F (36.7 C) 98.1 F (36.7 C) 98 F (36.7 C)  TempSrc: Oral Oral Oral Oral  SpO2: 97% 99% 100% 96%  Weight: 55.2 kg     Height:        Intake/Output Summary (Last 24 hours) at 09/08/2019 0751 Last data filed at 09/07/2019 1740 Gross per 24 hour  Intake 597.7 ml  Output 800 ml  Net -202.3 ml   Filed Weights   09/05/19 1543 09/06/19 0440 09/07/19 0605  Weight: 52.9 kg 52.4 kg 55.2 kg    Examination:  General exam: Appears calm and comfortable Respiratory system: Clear to auscultation. Respiratory effort normal. Cardiovascular system: S1 & S2 heard, RRR. No murmurs, rubs, gallops or clicks. Gastrointestinal system: Abdomen is distended, soft and tender in periumbilical area. Normal bowel sounds heard. Central nervous system: Alert and oriented. No focal neurological deficits. Extremities: No edema. No calf tenderness Skin: No cyanosis. No rashes Psychiatry: Judgement and insight appear normal. Mood & affect appropriate.     Data Reviewed: I have personally reviewed following labs and imaging studies  CBC: Recent Labs  Lab 09/04/19 1438 09/05/19 0054 09/06/19 1221 09/08/19 0516  WBC 6.7 6.4 5.6 5.2  NEUTROABS 4.2 3.5 3.1 2.7  HGB 13.8 13.3 11.2* 10.7*  HCT 42.1 42.4 34.5* 32.7*  MCV 92.6 97.0 95.8 94.5  PLT 251.0 198 169 [id number]   Basic Metabolic Panel: Recent Labs  Lab 09/04/19 1438 09/05/19 0054 09/06/19 1221 09/08/19 0516  NA 137 137  138 141  K 4.3 3.9 3.9 3.5  CL 102 106 109 110  CO2 29 24 24 26   GLUCOSE 101* 88 104* 87  BUN 12 10 10  <5*  CREATININE 0.79 0.73 0.70 0.69  CALCIUM 9.2 8.3* 7.9* 8.4*  MG  --  2.0  --   --    GFR: Estimated Creatinine Clearance: 48.7 mL/min (by C-G formula based on SCr of 0.69 mg/dL). Liver Function Tests: Recent Labs  Lab 09/04/19 1438 09/05/19 0054 09/06/19 1221 09/08/19 0516  AST 11 17 39 29  ALT 8 10 41 34  ALKPHOS 73 64 77 67  BILITOT 0.3 0.4 0.6 0.5  PROT 6.8 6.2* 5.8* 5.6*  ALBUMIN 4.2 3.6 3.3* 3.3*   Recent Labs  Lab 09/05/19 0054  LIPASE 26   No results for input(s): AMMONIA in the last 168 hours. Coagulation Profile: No results for input(s): INR, PROTIME in the last 168 hours. Cardiac Enzymes: No results for input(s): CKTOTAL, CKMB, CKMBINDEX, TROPONINI in the last 168 hours. BNP (last 3 results) No results for input(s): PROBNP in the last 8760 hours. HbA1C: No results for input(s): HGBA1C in the last 72 hours. CBG: No results for input(s): GLUCAP in the last 168 hours. Lipid Profile: No results for input(s): CHOL, HDL, LDLCALC, TRIG, CHOLHDL, LDLDIRECT in the last 72 hours. Thyroid Function Tests: No results for input(s): TSH, T4TOTAL, FREET4, T3FREE, THYROIDAB in the last 72 hours. Anemia Panel: No results for input(s): VITAMINB12, FOLATE, FERRITIN, TIBC, IRON, RETICCTPCT in the last 72 hours. Sepsis Labs: Recent Labs  Lab 09/05/19 1210  LATICACIDVEN 0.8    Recent Results (from the past 240 hour(s))  SARS CORONAVIRUS 2 (TAT 6-24 HRS) Nasopharyngeal Nasopharyngeal Swab     Status: None   Collection Time: 09/04/19 10:28 PM   Specimen: Nasopharyngeal Swab  Result Value Ref Range Status   SARS Coronavirus 2 NEGATIVE NEGATIVE Final    Comment: (NOTE) SARS-CoV-2 target nucleic acids are NOT DETECTED. The SARS-CoV-2 RNA is generally detectable in upper and lower respiratory specimens during the acute phase of infection. Negative results do not  preclude SARS-CoV-2 infection, do not rule out co-infections with other pathogens, and should not be used as the sole basis for treatment or other patient management decisions. Negative results must be combined with clinical observations, patient history, and epidemiological information. The expected result is Negative. Fact Sheet for Patients: SugarRoll.be Fact Sheet for Healthcare Providers: https://www.woods-mathews.com/ This test is not yet approved or cleared by the [place] FDA and  has been authorized for detection and/or diagnosis of SARS-CoV-2 by FDA under an Emergency Use Authorization (EUA). This EUA will remain  in effect (meaning this test can be used) for the duration of the COVID-19 declaration under Section 56 4(b)(1) of the Act, 21 U.S.C. section 360bbb-3(b)(1), unless the authorization is terminated or revoked sooner. Performed at [facility], [place] [address], [place], [place] [address]          Radiology Studies: DG Abd 2 Views  Result Date: 09/06/2019 CLINICAL DATA:  Constipation, acute generalized abdominal pain. EXAM: ABDOMEN - 2 VIEW COMPARISON:  September 05, 2019. FINDINGS: The bowel gas pattern is normal. Residual contrast is noted throughout  the colon. There is no evidence of free air. No radio-opaque calculi or other significant radiographic abnormality is seen. IMPRESSION: No evidence of bowel obstruction or ileus. Electronically Signed   By: Marijo Conception M.D.   On: 09/06/2019 11:40        Scheduled Meds: . atorvastatin  40 mg Oral [name]  . estradiol  0.1 mg Transdermal Q Mon  . lisinopril  20 mg Oral [name]  . pantoprazole (PROTONIX) IV  40 mg Intravenous Q24H  . sertraline  25 mg Oral QHS  . sodium chloride flush  3 mL Intravenous Q12H   Continuous Infusions:    LOS: 4 days     Cordelia Poche, MD [facility] 09/08/2019, 7:51 AM  If 7PM-7AM, please contact  night-coverage [email or web address]

## 2019-09-09 ENCOUNTER — Encounter (HOSPITAL_COMMUNITY)
Admission: EM | Disposition: A | Discharge status: Home / Self Care | Payer: Self-pay | Source: Home / Self Care | Attending: Family Medicine

## 2019-09-09 ENCOUNTER — Inpatient Hospital Stay (HOSPITAL_COMMUNITY): Payer: Medicare HMO | Admitting: Anesthesiology

## 2019-09-09 ENCOUNTER — Encounter (HOSPITAL_COMMUNITY): Payer: Self-pay | Admitting: Internal Medicine

## 2019-09-09 HISTORY — PX: COLONOSCOPY WITH PROPOFOL: SHX5780

## 2019-09-09 SURGERY — COLONOSCOPY WITH PROPOFOL
Anesthesia: Monitor Anesthesia Care

## 2019-09-09 MED ORDER — PROPOFOL 10 MG/ML IV BOLUS
INTRAVENOUS | Status: AC
  Filled 2019-09-09: qty 20

## 2019-09-09 MED ORDER — PROPOFOL 500 MG/50ML IV EMUL
INTRAVENOUS | Status: DC | PRN
  Administered 2019-09-09: 125 ug/kg/min via INTRAVENOUS

## 2019-09-09 MED ORDER — SODIUM CHLORIDE 0.9 % IV SOLN: INTRAVENOUS | Status: DC

## 2019-09-09 MED ORDER — LIDOCAINE HCL (CARDIAC) PF 100 MG/5ML IV SOSY
PREFILLED_SYRINGE | INTRAVENOUS | Status: DC | PRN
  Administered 2019-09-09: 60 mg via INTRATRACHEAL

## 2019-09-09 MED ORDER — POLYETHYLENE GLYCOL 3350 17 G PO PACK: 17.0000 g | PACK | Freq: Two times a day (BID) | ORAL | 0 refills | Status: AC

## 2019-09-09 MED ORDER — LACTATED RINGERS IV SOLN
INTRAVENOUS | Status: AC | PRN
  Administered 2019-09-09: 1000 mL via INTRAVENOUS

## 2019-09-09 MED ORDER — PROPOFOL 500 MG/50ML IV EMUL
INTRAVENOUS | Status: DC | PRN
  Administered 2019-09-09 (×2): 20 mg via INTRAVENOUS
  Administered 2019-09-09: 10 mg via INTRAVENOUS

## 2019-09-09 SURGICAL SUPPLY — 22 items

## 2019-09-09 NOTE — Transfer of Care (Signed)
Immediate Anesthesia Transfer of Care Note  Patient: Leah [name]  Procedure(s) Performed: Procedure(s): COLONOSCOPY WITH PROPOFOL (N/A)  Patient Location: PACU  Anesthesia Type:MAC  Level of Consciousness:  sedated, patient cooperative and responds to stimulation  Airway & Oxygen Therapy:Patient Spontanous Breathing and Patient connected to face mask oxgen  Post-op Assessment:  Report given to PACU RN and Post -op Vital signs reviewed and stable  Post vital signs:  Reviewed and stable  Last Vitals:  Vitals:   09/09/19 0448 09/09/19 1002  BP: (!) 111/59 (!) 149/70  Pulse: 77   Resp: 16 17  Temp: 36.9 C 37.1 C  SpO2: 79% 72%    Complications: No apparent anesthesia complications

## 2019-09-09 NOTE — Discharge Summary (Signed)
Physician Discharge Summary  Leah [name] [id number] DOB: 1946-03-28 DOA: 09/04/2019  PCP: [name], DO  Admit date: 09/04/2019 Discharge date: 09/09/2019  Admitted From: Home Disposition: Home  Recommendations for Outpatient Follow-up:  1. Follow up with PCP in 1 week 2. Follow up with GI 3. Please obtain BMP/CBC in one week 4. Please follow up on the following pending results: None  Home Health: None Equipment/Devices: None  Discharge Condition: Stable CODE STATUS: Full code Diet recommendation: Heart healthy   Brief/Interim Summary:  Admission HPI written by Rhetta Mura, DO   Chief Complaint: Abdominal pain  HPI: Leah [name] is a 74 y.o. female with medical history significant for small bowel obstruction, peripheral artery disease, hypertension, hyperlipidemia, diverticulitis, chronic gastritis, chronic constipation, GERD, who is admitted to [facility] on 09/04/2019 with suspected small bowel obstruction after presenting from home to [facility] Emergency Department complaining of abdominal pain.   The patient reports 1 week of progressive abdominal discomfort, which she describes as sharp discomfort in diffuse abdominal distribution, and made worse with palpation of the abdomen.  She reports associated decline in frequency of bowel movements and flatus production over the last week.  Specifically, she reports that she had not had a bowel movement over the preceding 4 days leading up to today's admission until she experienced to episodes of loose stool earlier today.  Denies any associated melena or hematochezia.  She reports that the distribution and intensity of her diffuse abdominal pain was unchanged at the time of these bowel movements.  Following these 2 episodes of loose stool, the patient reports no subsequent bowel movements and also conveys no additional flatus production following these episodes.  In assessing for any  recent antibiotic use, the patient reports that she completed a course of doxycycline in December 2020 for sinusitis. she denies any associated nausea or vomiting.  Denies any recent trauma.  Denies any subjective fever, chills, rigors, or generalized myalgias.   Denies any recent headache, neck stiffness, rhinitis, rhinorrhea, sore throat, sob, cough, or rash. No recent traveling or known COVID-19 exposures. Denies dysuria, gross hematuria, or change in urinary urgency/frequency.  She reports a previous hospitalization approximately 5 years ago for small bowel obstruction, which reportedly resolved spontaneously via conservative measures.  She has a history of multiple prior abdominal surgeries, including open abdominal hysterectomy in 1975, laparoscopic sigmoid colectomy with reanastomosis in the setting of diverticulitis, laparoscopic cholecystectomy, and laparoscopic appendectomy.  Denies any known history of inflammatory bowel disease.    In the setting of her history of multiple gastrointestinal pathologies, including chronic gastritis, chronic constipation, diverticulitis status post laparoscopic sigmoid colectomy with reanastomosis, patient reports that she closely follows with outpatient gastroenterology.  After contacting her outpatient gastroenterologist to inform of recent diffuse abdominal discomfort, and an outpatient CT of the abdomen performed earlier today, with the patient subsequently received a call from her gastroenterologist directing her to present to the emergency department for further evaluation of the associated findings.   Specifically, CT abdomen/pelvis with contrast performed on 09/04/2019 showed dilated loops of small bowel with air-fluid levels suggestive of partial small bowel obstruction versus ileus.  CT abdomen/pelvis also showed abnormal wall thickening and surrounding fat stranding involving the descending colon and sigmoid colon consistent with colitis in the absence  of any evidence of perforation or abscess.  No mention of watershed distribution was made in radiology report.    Hospital course:  Partial small bowel obstruction CT  scan suggests obstruction vs ileus. CT also was concerning for possible colitis. Clinically, patient did have some nausea/vomiting which has now resolved. She has a history of multiple abdominal surgeries. Abdominal x-ray on 2/5 and repeat on 2/6 not suggestive of obstruction or ileus. Resolved. Patient with bowel movements and flatus. Tolerating diet prior to discharge. Colonoscopy performed on day of discharge was not significant for evidence of colitis. Gi recommending MiraLAX on discharge.  GERD Patient is on Pepcid as an outpatient.  Essential hypertension Patient is on lisinopril as an outpatient. BP controled. Continue lisinopril.  PAD Patient is s/p stent placement in March 2020. She is on Plavix and aspirin as an outpatient. Continue Plavix/Aspirin  Hyperlipidemia Patient is on Lipitor and Zetia as an outpatient. Continue on discharge.  Tobacco use Counseled on admission  Depression Anxiety On Zoloft and Ativan as an outpatient. Continue Zoloft and Ativan  Discharge Diagnoses:  Principal Problem:   Partial small bowel obstruction (HCC) Active Problems:   HLD (hyperlipidemia)   GERD (gastroesophageal reflux disease)   Abdominal pain   HTN (hypertension)    Discharge Instructions  Discharge Instructions    Call MD for:  severe uncontrolled pain   Complete by: As directed    Increase activity slowly   Complete by: As directed      Allergies as of 09/09/2019      Reactions   Amoxicillin-pot Clavulanate Shortness Of Breath, Itching, Swelling   Avelox [moxifloxacin Hcl In Nacl] Swelling   Swelling, itching and shortness of breath   Ciprofloxacin Shortness Of Breath, Itching, Swelling   Keflex [cephalexin] Shortness Of Breath, Swelling   Penicillins Shortness Of Breath, Itching, Swelling    Did it involve swelling of the face/tongue/throat, SOB, or low BP? Yes Did it involve sudden or severe rash/hives, skin peeling, or any reaction on the inside of your mouth or nose? No Did you need to seek medical attention at a hospital or doctor's office? Yes When did it last happen?Several years If all above answers are "NO", may proceed with cephalosporin use.   Sulfa Antibiotics Shortness Of Breath, Itching, Swelling   Prednisone Other (See Comments)   GI irritation; is able to tolerate injections   Acyclovir And Related Other (See Comments)   Pt does not recall this reaction   Clindamycin/lincomycin Itching, Swelling   Levaquin [levofloxacin] Hives   Zyvox [linezolid] Diarrhea      Medication List    TAKE these medications   aspirin 81 MG EC tablet Take 1 tablet (81 mg total) by mouth daily.   atorvastatin 80 MG tablet Commonly known as: LIPITOR TAKE 1 TABLET (80 MG TOTAL) BY MOUTH DAILY.   clopidogrel 75 MG tablet Commonly known as: PLAVIX TAKE 1 TABLET BY MOUTH EVERY DAY   dicyclomine 10 MG capsule Commonly known as: BENTYL Take 1 capsule (10 mg total) by mouth 2 (two) times daily as needed for spasms.   estradiol 0.1 mg/24hr patch Commonly known as: CLIMARA - Dosed in mg/24 hr Place 0.1 mg onto the skin every Monday.   ezetimibe 10 MG tablet Commonly known as: ZETIA Take 1 tablet (10 mg total) by mouth daily.   famotidine 20 MG tablet Commonly known as: PEPCID TAKE 1 TABLET TWICE DAILY   lisinopril 20 MG tablet Commonly known as: ZESTRIL TAKE 1 TABLET BY MOUTH EVERY DAY   LORazepam 0.5 MG tablet Commonly known as: ATIVAN Take 0.5 mg by mouth at bedtime.   polyethylene glycol 17 g packet Commonly known as: MiraLax  Take 17 g by mouth 2 (two) times daily.   Repatha SureClick [id number] MG/ML Soaj Generic drug: Evolocumab INJECT 1 DOSE INTO THE SKIN EVERY 14 (FOURTEEN) DAYS. What changed: See the new instructions.   sertraline 50 MG tablet Commonly  known as: ZOLOFT Take 25 mg by mouth at bedtime.      Follow-up Information    [name], DO. Schedule an appointment as soon as possible for a visit in 1 week(s).   Specialty: Family Medicine Contact information: [address] [place] [address] [phone]        Doran Stabler, MD. Schedule an appointment as soon as possible for a visit.   Specialty: Gastroenterology Contact information: [address] [place] [address] [phone]          Allergies  Allergen Reactions  . Amoxicillin-Pot Clavulanate Shortness Of Breath, Itching and Swelling  . Avelox [Moxifloxacin Hcl In Nacl] Swelling    Swelling, itching and shortness of breath  . Ciprofloxacin Shortness Of Breath, Itching and Swelling  . Keflex [Cephalexin] Shortness Of Breath and Swelling  . Penicillins Shortness Of Breath, Itching and Swelling    Did it involve swelling of the face/tongue/throat, SOB, or low BP? Yes Did it involve sudden or severe rash/hives, skin peeling, or any reaction on the inside of your mouth or nose? No Did you need to seek medical attention at a hospital or doctor's office? Yes When did it last happen?Several years If all above answers are "NO", may proceed with cephalosporin use.   . Sulfa Antibiotics Shortness Of Breath, Itching and Swelling  . Prednisone Other (See Comments)    GI irritation; is able to tolerate injections  . Acyclovir And Related Other (See Comments)    Pt does not recall this reaction  . Clindamycin/Lincomycin Itching and Swelling  . Levaquin [Levofloxacin] Hives  . Zyvox [Linezolid] Diarrhea    Consultations:  [facility]   Procedures/Studies: CT Abdomen Pelvis W Contrast  Result Date: 09/04/2019 CLINICAL DATA:  Abdominal pain, chills. EXAM: CT ABDOMEN AND PELVIS WITH CONTRAST TECHNIQUE: Multidetector CT imaging of the abdomen and pelvis was performed using the standard protocol following bolus  administration of intravenous contrast. CONTRAST:  120mL OMNIPAQUE IOHEXOL 300 MG/ML  SOLN COMPARISON:  04/08/2016 FINDINGS: Lower chest: No acute abnormality. Hepatobiliary: Scattered liver cysts are identified. Previous cholecystectomy. No significant biliary dilatation. Pancreas: Pancreas divisum anatomy is suspected. No main duct dilatation, inflammation or mass. Spleen: Spleen is unremarkable Adrenals/Urinary Tract: The adrenal glands are normal. No kidney mass or hydronephrosis. Urinary bladder is unremarkable. Stomach/Bowel: Stomach is unremarkable. Proximal small bowel loops appear normal within the lower abdomen and pelvis there are multiple loops of small bowel which are increased in caliber measuring up to 2.3 cm with air-fluid levels. Gradual transition to normal to decreased caliber distal small bowel noted. There is abnormal wall thickening with surrounding fat stranding involving the descending colon and sigmoid colon concerning for colitis. No pneumatosis or bowel perforation. Vascular/Lymphatic: Aortic atherosclerosis. No aneurysm. No abdominopelvic adenopathy. Reproductive: Status post hysterectomy. No adnexal masses. Other: Trace free fluid noted within the pelvis. Musculoskeletal: No acute or significant osseous findings. IMPRESSION: 1. Abnormal wall thickening and surrounding fat stranding involving the descending colon and sigmoid colon compatible with colitis. No pneumatosis or bowel perforation. 2. Mildly dilated loops of small bowel with air-fluid levels. Findings may reflect ileus or partial small bowel obstruction. 3. Pancreas divisum anatomy. Aortic Atherosclerosis (ICD10-I70.0). Electronically  Signed   By: Kerby Moors M.D.   On: 09/04/2019 17:36   DG Abd 2 Views  Result Date: 09/06/2019 CLINICAL DATA:  Constipation, acute generalized abdominal pain. EXAM: ABDOMEN - 2 VIEW COMPARISON:  September 05, 2019. FINDINGS: The bowel gas pattern is normal. Residual contrast is noted throughout  the colon. There is no evidence of free air. No radio-opaque calculi or other significant radiographic abnormality is seen. IMPRESSION: No evidence of bowel obstruction or ileus. Electronically Signed   By: Marijo Conception M.D.   On: 09/06/2019 11:40   DG Abd 2 Views  Result Date: 09/05/2019 CLINICAL DATA:  Small-bowel obstruction EXAM: ABDOMEN - 2 VIEW COMPARISON:  04/11/2016, 09/04/2019 FINDINGS: Supine and left lateral decubitus radiographs of the abdomen are obtained. Oral contrast administered for preceding CT has progressed throughout the colon. No evidence of small-bowel obstruction. The wall thickening of the distal colon seen on CT is not well appreciated by radiograph. No masses or abnormal calcifications. Left iliac stent unchanged. No free gas within the peritoneal cavity. IMPRESSION: 1. No evidence small bowel obstruction. 2. Please refer to preceding CT abdomen report describing distal colonic wall thickening. Electronically Signed   By: Randa Ngo M.D.   On: 09/05/2019 08:32    COLONOSCOPY (09/09/2019)  Impression:               - Preparation of the colon was poor.                           - Patent end-to-side colo-colonic anastomosis,                            characterized by healthy appearing mucosa.                           - Stool in the entire examined colon.                           - The examination was otherwise normal on direct                            and retroflexion views.                           - No specimens collected.                           No colitis was seen. The shape of the anastomosis                            accounts for the abnormal appearance on CT scan.                            The current episode appears to have been a PSBO                            (now resolved), most likely due to adhesions from  multiple prior surgeries and protracted                            post-operative infectious course after sigmoid                              resection.  Recommendation:           - Return patient to hospital ward for possible                            discharge same day.                           - Miralax 1 capful (17 grams) in 8 ounces of water                            PO BID.                           - Outpatient follow up will be arranged.   Subjective: Abdominal pain. No other issues overnight.  Discharge Exam: Vitals:   09/09/19 1236 09/09/19 1328  BP: (!) 147/80 (!) 152/58  Pulse: (!) 53 (!) 56  Resp: 16 16  Temp: 97.6 F (36.4 C) 97.8 F (36.6 C)  SpO2: 96% 99%   Vitals:   09/09/19 1205 09/09/19 1210 09/09/19 1236 09/09/19 1328  BP:  (!) 148/58 (!) 147/80 (!) 152/58  Pulse: 66 66 (!) 53 (!) 56  Resp: (!) 21 16 16 16   Temp:   97.6 F (36.4 C) 97.8 F (36.6 C)  TempSrc:   Oral Oral  SpO2: 99% 97% 96% 99%  Weight:      Height:        General exam: Appears calm and comfortable Respiratory system: Clear to auscultation. Respiratory effort normal. Cardiovascular system: S1 & S2 heard, RRR. No murmurs, rubs, gallops or clicks. Gastrointestinal system: Abdomen is mildly distended (improved), soft and mildly tender in periumbilical region (improved). Normal bowel sounds heard. Central nervous system: Alert and oriented. No focal neurological deficits. Extremities: No edema. No calf tenderness Skin: No cyanosis. No rashes Psychiatry: Judgement and insight appear normal. Mood & affect appropriate.   The results of significant diagnostics from this hospitalization (including imaging, microbiology, ancillary and laboratory) are listed below for reference.     Microbiology: Recent Results (from the past 240 hour(s))  SARS CORONAVIRUS 2 (TAT 6-24 HRS) Nasopharyngeal Nasopharyngeal Swab     Status: None   Collection Time: 09/04/19 10:28 PM   Specimen: Nasopharyngeal Swab  Result Value Ref Range Status   SARS Coronavirus 2 NEGATIVE NEGATIVE Final    Comment:  (NOTE) SARS-CoV-2 target nucleic acids are NOT DETECTED. The SARS-CoV-2 RNA is generally detectable in upper and lower respiratory specimens during the acute phase of infection. Negative results do not preclude SARS-CoV-2 infection, do not rule out co-infections with other pathogens, and should not be used as the sole basis for treatment or other patient management decisions. Negative results must be combined with clinical observations, patient history, and epidemiological information. The expected result is Negative. Fact Sheet for Patients: SugarRoll.be Fact Sheet for Healthcare Providers: https://www.woods-mathews.com/ This test is not yet approved or cleared by the [place] FDA and  has  been authorized for detection and/or diagnosis of SARS-CoV-2 by FDA under an Emergency Use Authorization (EUA). This EUA will remain  in effect (meaning this test can be used) for the duration of the COVID-19 declaration under Section 56 4(b)(1) of the Act, 21 U.S.C. section 360bbb-3(b)(1), unless the authorization is terminated or revoked sooner. Performed at [facility], [place] [address] [place], [place] [address]      Labs: BNP (last 3 results) No results for input(s): BNP in the last 8760 hours. Basic Metabolic Panel: Recent Labs  Lab 09/04/19 1438 09/05/19 0054 09/06/19 1221 09/08/19 0516  NA 137 137 138 141  K 4.3 3.9 3.9 3.5  CL 102 106 109 110  CO2 29 24 24 26   GLUCOSE 101* 88 104* 87  BUN 12 10 10  <5*  CREATININE 0.79 0.73 0.70 0.69  CALCIUM 9.2 8.3* 7.9* 8.4*  MG  --  2.0  --   --    Liver Function Tests: Recent Labs  Lab 09/04/19 1438 09/05/19 0054 09/06/19 1221 09/08/19 0516  AST 11 17 39 29  ALT 8 10 41 34  ALKPHOS 73 64 77 67  BILITOT 0.3 0.4 0.6 0.5  PROT 6.8 6.2* 5.8* 5.6*  ALBUMIN 4.2 3.6 3.3* 3.3*   Recent Labs  Lab 09/05/19 0054  LIPASE 26   No results for input(s): AMMONIA in the last 168  hours. CBC: Recent Labs  Lab 09/04/19 1438 09/05/19 0054 09/06/19 1221 09/08/19 0516  WBC 6.7 6.4 5.6 5.2  NEUTROABS 4.2 3.5 3.1 2.7  HGB 13.8 13.3 11.2* 10.7*  HCT 42.1 42.4 34.5* 32.7*  MCV 92.6 97.0 95.8 94.5  PLT 251.0 198 169 199   Cardiac Enzymes: No results for input(s): CKTOTAL, CKMB, CKMBINDEX, TROPONINI in the last 168 hours. BNP: Invalid input(s): POCBNP CBG: No results for input(s): GLUCAP in the last 168 hours. D-Dimer No results for input(s): DDIMER in the last 72 hours. Hgb A1c No results for input(s): HGBA1C in the last 72 hours. Lipid Profile No results for input(s): CHOL, HDL, LDLCALC, TRIG, CHOLHDL, LDLDIRECT in the last 72 hours. Thyroid function studies No results for input(s): TSH, T4TOTAL, T3FREE, THYROIDAB in the last 72 hours.  Invalid input(s): FREET3 Anemia work up No results for input(s): VITAMINB12, FOLATE, FERRITIN, TIBC, IRON, RETICCTPCT in the last 72 hours. Urinalysis    Component Value Date/Time   COLORURINE COLORLESS (A) 09/04/2019 2315   APPEARANCEUR CLEAR 09/04/2019 2315   LABSPEC 1.013 09/04/2019 2315   PHURINE 6.0 09/04/2019 2315   GLUCOSEU NEGATIVE 09/04/2019 2315   HGBUR NEGATIVE 09/04/2019 2315   BILIRUBINUR NEGATIVE 09/04/2019 2315   BILIRUBINUR negative 10/08/2017 1143   KETONESUR NEGATIVE 09/04/2019 2315   PROTEINUR NEGATIVE 09/04/2019 2315   UROBILINOGEN 0.2 10/08/2017 1143   UROBILINOGEN 0.2 09/19/2013 1627   NITRITE NEGATIVE 09/04/2019 2315   LEUKOCYTESUR NEGATIVE 09/04/2019 2315   Sepsis Labs Invalid input(s): PROCALCITONIN,  WBC,  LACTICIDVEN Microbiology Recent Results (from the past 240 hour(s))  SARS CORONAVIRUS 2 (TAT 6-24 HRS) Nasopharyngeal Nasopharyngeal Swab     Status: None   Collection Time: 09/04/19 10:28 PM   Specimen: Nasopharyngeal Swab  Result Value Ref Range Status   SARS Coronavirus 2 NEGATIVE NEGATIVE Final    Comment: (NOTE) SARS-CoV-2 target nucleic acids are NOT DETECTED. The  SARS-CoV-2 RNA is generally detectable in upper and lower respiratory specimens during the acute phase of infection. Negative results do not preclude SARS-CoV-2 infection, do not rule out co-infections with other pathogens, and should  not be used as the sole basis for treatment or other patient management decisions. Negative results must be combined with clinical observations, patient history, and epidemiological information. The expected result is Negative. Fact Sheet for Patients: SugarRoll.be Fact Sheet for Healthcare Providers: https://www.woods-mathews.com/ This test is not yet approved or cleared by the [place] FDA and  has been authorized for detection and/or diagnosis of SARS-CoV-2 by FDA under an Emergency Use Authorization (EUA). This EUA will remain  in effect (meaning this test can be used) for the duration of the COVID-19 declaration under Section 56 4(b)(1) of the Act, 21 U.S.C. section 360bbb-3(b)(1), unless the authorization is terminated or revoked sooner. Performed at [facility], [place] [address], [place], [place] [address]      Time coordinating discharge: 35 minutes  SIGNED:   Cordelia Poche, MD [facility] 09/09/2019, 4:11 PM

## 2019-09-09 NOTE — Discharge Instructions (Signed)
Leah [name],  You were in the hospital because of abdominal pain, nausea and vomiting likely related to a partial small bowel obstruction related to prior surgeries. The GI doctor is recommending outpatient follow-up at his office. He has recommended for you to take MiraLAX twice per day to keep regular soft stools. If you have worsening symptoms, please return promptly.

## 2019-09-09 NOTE — Anesthesia Postprocedure Evaluation (Signed)
Anesthesia Post Note  Patient: Shamina Etheridge Garabedian  Procedure(s) Performed: COLONOSCOPY WITH PROPOFOL (N/A )     Patient location during evaluation: PACU Anesthesia Type: MAC Level of consciousness: awake and alert Pain management: pain level controlled Vital Signs Assessment: post-procedure vital signs reviewed and stable Respiratory status: spontaneous breathing, nonlabored ventilation, respiratory function stable and patient connected to nasal cannula oxygen Cardiovascular status: stable and blood pressure returned to baseline Postop Assessment: no apparent nausea or vomiting Anesthetic complications: no    Last Vitals:  Vitals:   09/09/19 1236 09/09/19 1328  BP: (!) 147/80 (!) 152/58  Pulse: (!) 53 (!) 56  Resp: 16 16  Temp: 36.4 C 36.6 C  SpO2: 96% 99%    Last Pain:  Vitals:   09/09/19 1328  TempSrc: Oral  PainSc:                  Jenipher Havel S

## 2019-09-09 NOTE — Anesthesia Procedure Notes (Signed)
Date/Time: 09/09/2019 11:19 AM Performed by: Lavina Hamman, CRNA Oxygen Delivery Method: Simple face mask

## 2019-09-09 NOTE — Progress Notes (Addendum)
PROGRESS NOTE    Leah [name]  [id number] DOB: 06/01/1946 DOA: 09/04/2019 PCP: [name], DO   Brief Narrative: Leah [name] is a 74 y.o. female with medical history significant for small bowel obstruction, peripheral artery disease, hypertension, hyperlipidemia, diverticulitis, chronic gastritis, chronic constipation, GERD. Patient presented secondary to abdominal pain and an abnormal CT abdomen/pelvis concerning for partial SBO vs ileus and colitis. GI consulted.   Assessment & Plan:   Principal Problem:   Colitis Active Problems:   HLD (hyperlipidemia)   GERD (gastroesophageal reflux disease)   Abdominal pain   HTN (hypertension)   Colitis Unsure of etiology. GI consulted. Antibiotics not started. Patient is afebrile and has no leukocytosis. Still with abdominal pain mainly in LLQ but also generalized. Normal lactic acid. Abdominal pain improving. -GI recommendations: Zofran IV, Protonix IV, hold Plavix. Plan for colonoscopy 2/9. GI pathogen panel pending.  Possible partial small bowel obstruction CT scan suggests obstruction vs ileus. Clinically, patient did have some nausea/vomiting which has now resolved. She has a history of multiple abdominal surgeries. She is passing gas. No recent bowel movement, but normally she has one every 4+ days. Abdominal x-ray on 2/5 and repeat on 2/6 not suggestive of obstruction or ileus. Resolved.  GERD Patient is on Pepcid as an outpatient. Started on Protonix IV -Continue Protonix  Essential hypertension Patient is on lisinopril as an outpatient. BP controled -Continue lisinopril  PAD Patient is s/p stent placement in March 2020. She is on Plavix and aspirin as an outpatient. -Continue to hold Plavix as recommended by GI -Aspirin on hold as well  Hyperlipidemia Patient is on Lipitor and Zetia as an outpatient -Continue Lipitor  Tobacco use Counseled on admission  Depression Anxiety On Zoloft and  Ativan as an outpatient -Continue Zoloft and Ativan   DVT prophylaxis: SCDs Code Status:   Code Status: Full Code Family Communication: None at bedside Disposition Plan: Discharge pending GI recommendations   Consultants:   [facility]  Procedures:   None  Antimicrobials:  None    Subjective: Abdominal pain. No other issues overnight.  Objective: Vitals:   09/08/19 0603 09/08/19 1539 09/08/19 2131 09/09/19 0448  BP: 130/64 (!) 155/78 (!) 141/80 (!) 111/59  Pulse: 72 (!) 59 (!) 57 77  Resp: 20 18 17 16   Temp: 98 F (36.7 C) 97.8 F (36.6 C) 98.4 F (36.9 C) 98.5 F (36.9 C)  TempSrc: Oral Oral    SpO2: 96% 98% 98% 96%  Weight:      Height:        Intake/Output Summary (Last 24 hours) at 09/09/2019 0836 Last data filed at 09/08/2019 2233 Gross per 24 hour  Intake 1419 ml  Output -  Net 1419 ml   Filed Weights   09/05/19 1543 09/06/19 0440 09/07/19 0605  Weight: 52.9 kg 52.4 kg 55.2 kg    Examination:  General exam: Appears calm and comfortable Respiratory system: Clear to auscultation. Respiratory effort normal. Cardiovascular system: S1 & S2 heard, RRR. No murmurs, rubs, gallops or clicks. Gastrointestinal system: Abdomen is mildly distended (improved), soft and mildly tender in periumbilical region (improved). Normal bowel sounds heard. Central nervous system: Alert and oriented. No focal neurological deficits. Extremities: No edema. No calf tenderness Skin: No cyanosis. No rashes Psychiatry: Judgement and insight appear normal. Mood & affect appropriate.     Data Reviewed: I have personally reviewed following labs and imaging studies  CBC: Recent Labs  Lab 09/04/19 1438 09/05/19 0054 09/06/19 1221 09/08/19  0516  WBC 6.7 6.4 5.6 5.2  NEUTROABS 4.2 3.5 3.1 2.7  HGB 13.8 13.3 11.2* 10.7*  HCT 42.1 42.4 34.5* 32.7*  MCV 92.6 97.0 95.8 94.5  PLT 251.0 198 169 [id number]   Basic Metabolic Panel: Recent Labs  Lab 09/04/19 1438 09/05/19 0054  09/06/19 1221 09/08/19 0516  NA 137 137 138 141  K 4.3 3.9 3.9 3.5  CL 102 106 109 110  CO2 29 24 24 26   GLUCOSE 101* 88 104* 87  BUN 12 10 10  <5*  CREATININE 0.79 0.73 0.70 0.69  CALCIUM 9.2 8.3* 7.9* 8.4*  MG  --  2.0  --   --    GFR: Estimated Creatinine Clearance: 48.7 mL/min (by C-G formula based on SCr of 0.69 mg/dL). Liver Function Tests: Recent Labs  Lab 09/04/19 1438 09/05/19 0054 09/06/19 1221 09/08/19 0516  AST 11 17 39 29  ALT 8 10 41 34  ALKPHOS 73 64 77 67  BILITOT 0.3 0.4 0.6 0.5  PROT 6.8 6.2* 5.8* 5.6*  ALBUMIN 4.2 3.6 3.3* 3.3*   Recent Labs  Lab 09/05/19 0054  LIPASE 26   No results for input(s): AMMONIA in the last 168 hours. Coagulation Profile: No results for input(s): INR, PROTIME in the last 168 hours. Cardiac Enzymes: No results for input(s): CKTOTAL, CKMB, CKMBINDEX, TROPONINI in the last 168 hours. BNP (last 3 results) No results for input(s): PROBNP in the last 8760 hours. HbA1C: No results for input(s): HGBA1C in the last 72 hours. CBG: No results for input(s): GLUCAP in the last 168 hours. Lipid Profile: No results for input(s): CHOL, HDL, LDLCALC, TRIG, CHOLHDL, LDLDIRECT in the last 72 hours. Thyroid Function Tests: No results for input(s): TSH, T4TOTAL, FREET4, T3FREE, THYROIDAB in the last 72 hours. Anemia Panel: No results for input(s): VITAMINB12, FOLATE, FERRITIN, TIBC, IRON, RETICCTPCT in the last 72 hours. Sepsis Labs: Recent Labs  Lab 09/05/19 1210  LATICACIDVEN 0.8    Recent Results (from the past 240 hour(s))  SARS CORONAVIRUS 2 (TAT 6-24 HRS) Nasopharyngeal Nasopharyngeal Swab     Status: None   Collection Time: 09/04/19 10:28 PM   Specimen: Nasopharyngeal Swab  Result Value Ref Range Status   SARS Coronavirus 2 NEGATIVE NEGATIVE Final    Comment: (NOTE) SARS-CoV-2 target nucleic acids are NOT DETECTED. The SARS-CoV-2 RNA is generally detectable in upper and lower respiratory specimens during the acute phase  of infection. Negative results do not preclude SARS-CoV-2 infection, do not rule out co-infections with other pathogens, and should not be used as the sole basis for treatment or other patient management decisions. Negative results must be combined with clinical observations, patient history, and epidemiological information. The expected result is Negative. Fact Sheet for Patients: SugarRoll.be Fact Sheet for Healthcare Providers: https://www.woods-mathews.com/ This test is not yet approved or cleared by the [place] FDA and  has been authorized for detection and/or diagnosis of SARS-CoV-2 by FDA under an Emergency Use Authorization (EUA). This EUA will remain  in effect (meaning this test can be used) for the duration of the COVID-19 declaration under Section 56 4(b)(1) of the Act, 21 U.S.C. section 360bbb-3(b)(1), unless the authorization is terminated or revoked sooner. Performed at [facility], [place] [address] [place], [place] [address]          Radiology Studies: No results found.      Scheduled Meds: . atorvastatin  40 mg Oral Daily  . estradiol  0.1 mg Transdermal Q Mon  . lisinopril  20 mg Oral Daily  . pantoprazole (PROTONIX) IV  40 mg Intravenous Q24H  . sertraline  25 mg Oral QHS  . sodium chloride flush  3 mL Intravenous Q12H   Continuous Infusions: . sodium chloride 20 mL/hr at 09/09/19 0452     LOS: 5 days     Cordelia Poche, MD [facility] 09/09/2019, 8:36 AM  If 7PM-7AM, please contact night-coverage [email or web address]

## 2019-09-09 NOTE — Op Note (Signed)
[facility] Patient Name: Leah [name] Procedure Date: 09/09/2019 MRN: [id number] Attending MD: Estill Cotta. Loletha Carrow , MD Date of Birth: 1945/11/08 CSN: [id number] Age: 74 Admit Type: Inpatient Procedure:                Colonoscopy Indications:              Lower abdominal pain, Abnormal CT of the GI tract                            (suggesting "colitis"), chronic Constipation Providers:                Mallie Mussel L. Loletha Carrow, MD, Josie Dixon, RN, Corie Chiquito, Technician, Arnoldo Hooker, CRNA Referring MD:             [facility] Medicines:                Monitored Anesthesia Care Complications:            No immediate complications. Estimated Blood Loss:     Estimated blood loss: none. Procedure:                Pre-Anesthesia Assessment:                           - Prior to the procedure, a History and Physical                            was performed, and patient medications and                            allergies were reviewed. The patient's tolerance of                            previous anesthesia was also reviewed. The risks                            and benefits of the procedure and the sedation                            options and risks were discussed with the patient.                            All questions were answered, and informed consent                            was obtained. Prior Anticoagulants: The patient has                            taken Plavix (clopidogrel), last dose was 6 days                            prior to procedure. ASA Grade Assessment: III - A  patient with severe systemic disease. After                            reviewing the risks and benefits, the patient was                            deemed in satisfactory condition to undergo the                            procedure.                           After obtaining informed consent, the colonoscope                            was  passed under direct vision. Throughout the                            procedure, the patient's blood pressure, pulse, and                            oxygen saturations were monitored continuously. The                            CF-HQ190L ([id number]) Olympus colonoscope was                            introduced through the anus and advanced to the the                            terminal ileum, with identification of the                            appendiceal orifice and IC valve. The colonoscopy                            was performed with difficulty due to poor bowel                            prep, poor endoscopic visualization, a redundant                            colon and a tortuous colon. The patient tolerated                            the procedure well. The quality of the bowel                            preparation was poor. The ileocecal valve,                            appendiceal orifice, and rectum were photographed. Scope In: 11:28:44 AM Scope Out: 11:43:45 AM Scope Withdrawal Time: 0 hours 5 minutes 6 seconds  Total Procedure Duration: 0 hours 15  minutes 1 second  Findings:      The perianal and digital rectal examinations were normal.      There was evidence of a prior end-to-side colo-colonic anastomosis in       the recto-sigmoid colon. This was patent and was characterized by       healthy appearing mucosa. The anastomosis was traversed. As previously       noted, the shape of the anastomosis creates significant tortuosity. The       left colon is also redundant, causing scope looping and requiring manual       pressure to reach the cecum. No diverticulosis was seen (given       limitations of preparation)      A large amount of semi-liquid stool was found in the entire colon,       interfering with visualization.      The exam was otherwise without abnormality on direct and retroflexion       views. Impression:               - Preparation of the colon was poor.                            - Patent end-to-side colo-colonic anastomosis,                            characterized by healthy appearing mucosa.                           - Stool in the entire examined colon.                           - The examination was otherwise normal on direct                            and retroflexion views.                           - No specimens collected.                           No colitis was seen. The shape of the anastomosis                            accounts for the abnormal appearance on CT scan.                            The current episode appears to have been a PSBO                            (now resolved), most likely due to adhesions from                            multiple prior surgeries and protracted                            post-operative infectious course after sigmoid  resection. Moderate Sedation:      MAC sedation used Recommendation:           - Return patient to hospital ward for possible                            discharge same day.                           - Miralax 1 capful (17 grams) in 8 ounces of water                            PO BID.                           - Outpatient follow up will be arranged. Procedure Code(s):        --- Professional ---                           [phone], Colonoscopy, flexible; diagnostic, including                            collection of specimen(s) by brushing or washing,                            when performed (separate procedure) Diagnosis Code(s):        --- Professional ---                           Z98.0, Intestinal bypass and anastomosis status                           R10.30, Lower abdominal pain, unspecified                           K59.00, Constipation, unspecified                           R93.3, Abnormal findings on diagnostic imaging of                            other parts of digestive tract CPT copyright 2019 American Medical Association. All rights  reserved. The codes documented in this report are preliminary and upon coder review may  be revised to meet current compliance requirements. Suzetta Timko L. Loletha Carrow, MD 09/09/2019 11:56:09 AM This report has been signed electronically. Number of Addenda: 0

## 2019-09-09 NOTE — Anesthesia Preprocedure Evaluation (Signed)
Anesthesia Evaluation  Patient identified by MRN, date of birth, ID band Patient awake    Reviewed: Allergy & Precautions, NPO status , Patient's Chart, lab work & pertinent test results  Airway Mallampati: II  TM Distance: >3 FB Neck ROM: Full    Dental no notable dental hx.    Pulmonary COPD, Current Smoker,    Pulmonary exam normal breath sounds clear to auscultation       Cardiovascular hypertension, + Peripheral Vascular Disease  Normal cardiovascular exam Rhythm:Regular Rate:Normal     Neuro/Psych negative neurological ROS  negative psych ROS   GI/Hepatic Neg liver ROS, GERD  ,  Endo/Other  negative endocrine ROS  Renal/GU negative Renal ROS  negative genitourinary   Musculoskeletal negative musculoskeletal ROS (+)   Abdominal   Peds negative pediatric ROS (+)  Hematology negative hematology ROS (+)   Anesthesia Other Findings   Reproductive/Obstetrics negative OB ROS                             Anesthesia Physical Anesthesia Plan  ASA: III  Anesthesia Plan: MAC   Post-op Pain Management:    Induction: Intravenous  PONV Risk Score and Plan: 0  Airway Management Planned: Simple Face Mask  Additional Equipment:   Intra-op Plan:   Post-operative Plan:   Informed Consent: I have reviewed the patients History and Physical, chart, labs and discussed the procedure including the risks, benefits and alternatives for the proposed anesthesia with the patient or authorized representative who has indicated his/her understanding and acceptance.     Dental advisory given  Plan Discussed with: CRNA and Surgeon  Anesthesia Plan Comments:         Anesthesia Quick Evaluation

## 2019-09-09 NOTE — Interval H&P Note (Signed)
History and Physical Interval Note:  09/09/2019 11:19 AM  Leah [name]  has presented today for surgery, with the diagnosis of colitis.  The various methods of treatment have been discussed with the patient and family. After consideration of risks, benefits and other options for treatment, the patient has consented to  Procedure(s): COLONOSCOPY WITH PROPOFOL (N/A) as a surgical intervention.  The patient's history has been reviewed, patient examined, no change in status, stable for surgery.  I have reviewed the patient's chart and labs.  Questions were answered to the patient's satisfaction.     Nelida Meuse III

## 2019-09-10 ENCOUNTER — Telehealth: Payer: Self-pay | Admitting: *Deleted

## 2019-09-10 NOTE — Telephone Encounter (Signed)
LVM for pt to call office and schedule hospital follow up appt.

## 2019-09-11 ENCOUNTER — Encounter: Payer: Self-pay | Admitting: *Deleted

## 2019-09-11 LAB — GI PATHOGEN PANEL BY PCR, STOOL

## 2019-09-11 NOTE — Telephone Encounter (Signed)
I have made two attempts and have been unable to reach patient. Pt has scheduled hospital follow up 09/19/19 w/ PCP.

## 2019-09-19 ENCOUNTER — Encounter: Payer: Self-pay | Admitting: Family Medicine

## 2019-09-19 ENCOUNTER — Other Ambulatory Visit: Payer: Self-pay

## 2019-09-19 ENCOUNTER — Ambulatory Visit (INDEPENDENT_AMBULATORY_CARE_PROVIDER_SITE_OTHER): Payer: Medicare HMO | Admitting: Family Medicine

## 2019-09-19 VITALS — BP 126/68 | Temp 97.5°F | Resp 18 | Ht 60.65 in | Wt 110.6 lb

## 2019-09-19 DIAGNOSIS — F17200 Nicotine dependence, unspecified, uncomplicated: Secondary | ICD-10-CM | POA: Diagnosis not present

## 2019-09-19 DIAGNOSIS — E785 Hyperlipidemia, unspecified: Secondary | ICD-10-CM

## 2019-09-19 DIAGNOSIS — I10 Essential (primary) hypertension: Secondary | ICD-10-CM

## 2019-09-19 DIAGNOSIS — K5792 Diverticulitis of intestine, part unspecified, without perforation or abscess without bleeding: Secondary | ICD-10-CM | POA: Diagnosis not present

## 2019-09-19 DIAGNOSIS — K56609 Unspecified intestinal obstruction, unspecified as to partial versus complete obstruction: Secondary | ICD-10-CM

## 2019-09-19 DIAGNOSIS — K565 Intestinal adhesions [bands], unspecified as to partial versus complete obstruction: Secondary | ICD-10-CM | POA: Diagnosis not present

## 2019-09-19 HISTORY — DX: Unspecified intestinal obstruction, unspecified as to partial versus complete obstruction: K56.609

## 2019-09-19 LAB — CBC WITH DIFFERENTIAL/PLATELET
Basophils Absolute: 0.1 10*3/uL (ref 0.0–0.1)
Basophils Relative: 1.3 % (ref 0.0–3.0)
Eosinophils Absolute: 0.5 10*3/uL (ref 0.0–0.7)
Eosinophils Relative: 7.8 % — ABNORMAL HIGH (ref 0.0–5.0)
HCT: 41.5 % (ref 36.0–46.0)
Hemoglobin: 13.4 g/dL (ref 12.0–15.0)
Lymphocytes Relative: 26.5 % (ref 12.0–46.0)
Lymphs Abs: 1.8 10*3/uL (ref 0.7–4.0)
MCHC: 32.3 g/dL (ref 30.0–36.0)
MCV: 93.7 fl (ref 78.0–100.0)
Monocytes Absolute: 0.6 10*3/uL (ref 0.1–1.0)
Monocytes Relative: 8.3 % (ref 3.0–12.0)
Neutro Abs: 3.8 10*3/uL (ref 1.4–7.7)
Neutrophils Relative %: 56.1 % (ref 43.0–77.0)
Platelets: 247 10*3/uL (ref 150.0–400.0)
RBC: 4.43 Mil/uL (ref 3.87–5.11)
RDW: 14.6 % (ref 11.5–15.5)
WBC: 6.7 10*3/uL (ref 4.0–10.5)

## 2019-09-19 LAB — LIPID PANEL
Cholesterol: 75 mg/dL (ref 0–200)
HDL: 46.6 mg/dL (ref >39.00)
LDL Cholesterol: 2 mg/dL (ref 0–99)
NonHDL: 28.85
Total CHOL/HDL Ratio: 2
Triglycerides: 136 mg/dL (ref 0.0–149.0)
VLDL: 27.2 mg/dL (ref 0.0–40.0)

## 2019-09-19 LAB — COMPREHENSIVE METABOLIC PANEL
ALT: 13 U/L (ref 0–35)
AST: 16 U/L (ref 0–37)
Albumin: 4.1 g/dL (ref 3.5–5.2)
Alkaline Phosphatase: 74 U/L (ref 39–117)
BUN: 10 mg/dL (ref 6–23)
CO2: 31 mEq/L (ref 19–32)
Calcium: 9.1 mg/dL (ref 8.4–10.5)
Chloride: 102 mEq/L (ref 96–112)
Creatinine, Ser: 0.78 mg/dL (ref 0.40–1.20)
GFR: 72.74 mL/min (ref >60.00)
Glucose, Bld: 96 mg/dL (ref 70–99)
Potassium: 4.5 mEq/L (ref 3.5–5.1)
Sodium: 138 mEq/L (ref 135–145)
Total Bilirubin: 0.3 mg/dL (ref 0.2–1.2)
Total Protein: 6.6 g/dL (ref 6.0–8.3)

## 2019-09-19 NOTE — Patient Instructions (Addendum)
COVID-19 Vaccine Information can be found at: ShippingScam.[email or web address].uk For questions related to vaccine distribution or appointments, please email [email or web address] or call [phone].       Bowel Obstruction A bowel obstruction is a blockage in the small or large bowel. The bowel, which is also called the intestine, is a long, slender tube that connects the stomach to the anus. When a person eats and drinks, food and fluids go from the mouth to the stomach to the small bowel. This is where most of the nutrients in the food and fluids are absorbed. After the small bowel, material passes through the large bowel for further absorption until any leftover material leaves the body as stool through the anus during a bowel movement. A bowel obstruction will prevent food and fluids from passing through the bowel as they normally do during digestion. The bowel can become partially or completely blocked. If this condition is not treated, it can be dangerous because the bowel could rupture. What are the causes? Common causes of this condition include:  Scar tissue (adhesions) from previous surgery or treatment with high-energy X-rays (radiation).  Recent surgery. This may cause the movements of the bowel to slow down and cause food to block the intestine.  Inflammatory bowel disease, such as Crohn's disease or diverticulitis.  Growths or tumors.  A bulging organ (hernia).  Twisting of the bowel (volvulus).  A foreign body.  Slipping of a part of the bowel into another part (intussusception). What are the signs or symptoms? Symptoms of this condition include:  Pain in the abdomen. Depending on the degree of obstruction, pain may be: ? Mild or severe. ? Dull cramping or sharp pain. ? In one area or in the entire abdomen.  Nausea and vomiting. Vomit may be greenish or a yellow bile color.  Bloating in the abdomen.  Difficulty  passing stool (constipation).  Lack of passing gas.  Frequent belching.  Diarrhea. This may occur if the obstruction is partial and runny stool is able to leak around the obstruction. How is this diagnosed? This condition may be diagnosed based on:  A physical exam.  Medical history.  Imaging tests of the abdomen or pelvis, such as X-ray or CT scan.  Blood or urine tests. How is this treated? Treatment for this condition depends on the cause and severity of the problem. Treatment may include:  Fluids and pain medicines that are given through an IV. Your health care provider may instruct you not to eat or drink if you have nausea or vomiting.  Eating a simple diet. You may be asked to consume a clear liquid diet for several days. This allows the bowel to rest.  Placement of a small tube (nasogastric tube) into the stomach. This will relieve pain, discomfort, and nausea by removing blocked air and fluids from the stomach. It can also help the obstruction clear up faster.  Surgery. This may be required if other treatments do not work. Surgery may be required for: ? Bowel obstruction from a hernia. This can be an emergency procedure. ? Scar tissue that causes frequent or severe obstructions. Follow these instructions at home: Medicines  Take over-the-counter and prescription medicines only as told by your health care provider.  If you were prescribed an antibiotic medicine, take it as told by your health care provider. Do not stop taking the antibiotic even if you start to feel better. General instructions  Follow instructions from your health care provider about eating restrictions. You may need to avoid  solid foods and consume only clear liquids until your condition improves.  Return to your normal activities as told by your health care provider. Ask your health care provider what activities are safe for you.  Avoid sitting for a long time without moving. Get up to take short  walks every 1-2 hours. This is important to improve blood flow and breathing. Ask for help if you feel weak or unsteady.  Keep all follow-up visits as told by your health care provider. This is important. How is this prevented? After having a bowel obstruction, you are more likely to have another. You may do the following things to prevent another obstruction:  If you have a long-term (chronic) disease, pay attention to your symptoms and contact your health care provider if you have questions or concerns.  Avoid becoming constipated. To prevent or treat constipation, your health care provider may recommend that you: ? Drink enough fluid to keep your urine pale yellow. ? Take over-the-counter or prescription medicines. ? Eat foods that are high in fiber, such as beans, whole grains, and fresh fruits and vegetables. ? Limit foods that are high in fat and processed sugars, such as fried or sweet foods.  Stay active. Exercise for 30 minutes or more, 5 or more days each week. Ask your health care provider which exercises are safe for you.  Avoid stress. Find ways to reduce stress, such as meditation, exercise, or taking time for activities that relax you.  Instead of eating three large meals each day, eat three small meals with three small snacks.  Work with a Microbiologist to make a healthy meal plan that works for you.  Do not use any products that contain nicotine or tobacco, such as cigarettes and e-cigarettes. If you need help quitting, ask your health care provider. Contact a health care provider if you:  Have a fever.  Have chills. Get help right away if you:  Have increased pain or cramping.  Vomit blood.  Have uncontrolled vomiting or nausea.  Cannot drink fluids because of vomiting or pain.  Become confused.  Begin feeling very thirsty (dehydrated).  Have severe bloating.  Feel extremely weak or you faint. Summary  A bowel obstruction is a blockage in the small or  large bowel.  A bowel obstruction will prevent food and fluids from passing through the bowel as they normally do during digestion.  Treatment for this condition depends on the cause and severity of the problem. It may include fluids and pain medicines through an IV, a simple diet, a nasogastric tube, or surgery.  Follow instructions from your health care provider about eating restrictions. You may need to avoid solid foods and consume only clear liquids until your condition improves. This information is not intended to replace advice given to you by your health care provider. Make sure you discuss any questions you have with your health care provider. Document Revised: 08/23/2018 Document Reviewed: 11/28/2017 Elsevier Patient Education  [place].

## 2019-09-19 NOTE — Assessment & Plan Note (Signed)
Pt is asymptomatic  Has gi f/u

## 2019-09-19 NOTE — Progress Notes (Signed)
atient ID: [name], female    DOB: [birth year]  Age: 74 y.o. MRN: [id number]    Subjective:  Subjective  HPI Leah [name] presents for hosp f/u for SBO, diverticulitis  2/4-2/9/   She presented to er with abd pain that had been progressively getting worse over 1 week.   She noticed dec bm.   She went to GI and they ordered a ct----- she was sent to er from CT for sbo/ colitis.   She was on a liquid diet and abx in hospital  Review of Systems  Constitutional: Negative for activity change, appetite change, fatigue, fever and unexpected weight change.  HENT: Negative for congestion.   Respiratory: Negative for cough and shortness of breath.   Cardiovascular: Negative for chest pain, palpitations and leg swelling.  Gastrointestinal: Negative for abdominal distention, abdominal pain, blood in stool, constipation, diarrhea and vomiting.  Musculoskeletal: Negative for back pain.  Skin: Negative for rash.  Neurological: Negative for headaches.  Psychiatric/Behavioral: Negative for behavioral problems and dysphoric mood. The patient is not nervous/anxious.     History Past Medical History:  Diagnosis Date   Allergy    Anemia    Anxiety    Barrett esophagus    Bladder pain    Chronic bronchitis ([place])    per pt last episode july 2016   Complication of anesthesia    slow to wake   COPD (chronic obstructive pulmonary disease) (HCC)    Depression    Diverticulosis    Frequency of urination    GERD (gastroesophageal reflux disease)    Hiatal hernia    History of adenomatous polyp of colon    tubular adenoma   History of chronic gastritis    History of diverticulitis of colon    History of esophageal dilatation    for stricture   History of esophageal spasm    Hyperlipidemia    Hypertension    IBS (irritable bowel syndrome)    Interstitial cystitis    PAD (peripheral artery disease) (HCC)    Productive cough    intermittant   Smokers'  cough (Leah [name])    Urgency of urination     She has a past surgical history that includes Carpal tunnel release (Right, 1993); Sinus surgery with [employer]at[employer] (1991); laparoscopic appendectomy (N/A, 09/21/2013); Anterior cervical decomp/discectomy fusion (N/A, 09/07/2014); Tonsillectomy and adenoidectomy (1964); Laparoscopic cholecystectomy (2002); Abdominal hysterectomy (1975); Temporomandibular joint surgery (1990); LAPAROSCOPY SIGMOID COLECTOMY (10-05-2008); Microlaryngoscopy (09-27-2006); Colonoscopy (last one 04-30-2013); Esophagogastroduodenoscopy (last one 09-11-2012); CYSTO/  HOD/  BLADDER BX (1990's); cysto with hydrodistension (N/A, 06/08/2015); Lower Extremity Angiography (Left, 10/14/2018); PERIPHERAL VASCULAR INTERVENTION (10/14/2018); ABDOMINAL AORTOGRAM (N/A, 10/14/2018); Lower Extremity Angiography (Bilateral, 10/14/2018); and Colonoscopy with propofol (N/A, 09/09/2019).   Her family history includes Colon polyps in her mother; Diabetes in her father; Heart disease in her father; Hypertension in her father; Leukemia in her father; Pancreatic cancer in her father and mother; Prostate cancer in her father.She reports that she has been smoking cigarettes. She has a 35.25 pack-year smoking history. She has never used smokeless tobacco. She reports that she does not drink alcohol or use drugs.  Current Outpatient Medications on File Prior to Visit  Medication Sig Dispense Refill   aspirin EC 81 MG EC tablet Take 1 tablet (81 mg total) by mouth daily.     atorvastatin (LIPITOR) 80 MG tablet TAKE 1 TABLET (80 MG TOTAL) BY MOUTH DAILY. 90 tablet 0   clopidogrel (PLAVIX) 75 MG tablet TAKE 1 TABLET BY  MOUTH EVERY DAY (Patient taking differently: Take 75 mg by mouth daily. ) 90 tablet 3   dicyclomine (BENTYL) 10 MG capsule Take 1 capsule (10 mg total) by mouth 2 (two) times daily as needed for spasms. 60 capsule 2   estradiol (CLIMARA - DOSED IN MG/24 HR) 0.1 mg/24hr patch Place 0.1 mg onto the skin every  Monday.  0   ezetimibe (ZETIA) 10 MG tablet Take 1 tablet (10 mg total) by mouth daily. 90 tablet 1   famotidine (PEPCID) 20 MG tablet TAKE 1 TABLET TWICE DAILY (Patient taking differently: Take 20 mg by mouth 2 (two) times daily. ) 180 tablet 0   lisinopril (ZESTRIL) 20 MG tablet TAKE 1 TABLET BY MOUTH EVERY DAY (Patient taking differently: Take 20 mg by mouth daily. ) 90 tablet 1   LORazepam (ATIVAN) 0.5 MG tablet Take 0.5 mg by mouth at bedtime.   5   polyethylene glycol (MIRALAX) 17 g packet Take 17 g by mouth 2 (two) times daily. 30 each 0   REPATHA SURECLICK XX123456 MG/ML SOAJ INJECT 1 DOSE INTO THE SKIN EVERY 14 (FOURTEEN) DAYS. (Patient taking differently: Inject 140 mg into the skin every 14 (fourteen) days. 1 dose (140mg ) every 14 days) 2 pen 11   sertraline (ZOLOFT) 50 MG tablet Take 25 mg by mouth at bedtime.      Current Facility-Administered Medications on File Prior to Visit  Medication Dose Route Frequency Provider Last Rate Last Admin   triamcinolone acetonide (KENALOG-40) injection (RADIOLOGY ONLY) 60 mg  60 mg Epidural Once Bergman, Meghan D, NP         Objective:  Objective  Physical Exam Vitals and nursing note reviewed.  Constitutional:      Appearance: She is well-developed.  HENT:     Head: Normocephalic and atraumatic.  Eyes:     Conjunctiva/sclera: Conjunctivae normal.  Neck:     Thyroid: No thyromegaly.     Vascular: No carotid bruit or JVD.  Cardiovascular:     Rate and Rhythm: Normal rate and regular rhythm.     Heart sounds: Normal heart sounds. No murmur.  Pulmonary:     Effort: Pulmonary effort is normal. No respiratory distress.     Breath sounds: Normal breath sounds. No wheezing or rales.  Chest:     Chest wall: No tenderness.  Abdominal:     General: There is no distension.     Tenderness: There is no abdominal tenderness. There is no guarding or rebound.  Musculoskeletal:     Cervical back: Normal range of motion and neck supple.    Neurological:     Mental Status: She is alert and oriented to person, place, and time.    BP 126/68 (BP Location: Right Arm, Patient Position: Sitting, Cuff Size: Normal)    Temp (!) 97.5 F (36.4 C) (Temporal)    Resp 18    Ht 5' 0.65" (1.541 m)    Wt 110 lb 9.6 oz (50.2 kg)    BMI 21.14 kg/m  Wt Readings from Last 3 Encounters:  09/19/19 110 lb 9.6 oz (50.2 kg)  09/09/19 121 lb 11.1 oz (55.2 kg)  09/04/19 110 lb 12.8 oz (50.3 kg)     Lab Results  Component Value Date   WBC 5.2 09/08/2019   HGB 10.7 (L) 09/08/2019   HCT 32.7 (L) 09/08/2019   PLT 199 09/08/2019   GLUCOSE 87 09/08/2019   CHOL 65 01/07/2019   TRIG 90.0 01/07/2019   HDL 33.80 (L)  01/07/2019   LDLDIRECT 151.0 11/07/2016   LDLCALC 13 01/07/2019   ALT 34 09/08/2019   AST 29 09/08/2019   NA 141 09/08/2019   K 3.5 09/08/2019   CL 110 09/08/2019   CREATININE 0.69 09/08/2019   BUN <5 (L) 09/08/2019   CO2 26 09/08/2019   TSH 3.976 09/05/2019   INR 1.0 05/21/2009   HGBA1C 6.1 12/18/2017   MICROALBUR <0.7 11/09/2014    CT Abdomen Pelvis W Contrast  Result Date: 09/04/2019 CLINICAL DATA:  Abdominal pain, chills. EXAM: CT ABDOMEN AND PELVIS WITH CONTRAST TECHNIQUE: Multidetector CT imaging of the abdomen and pelvis was performed using the standard protocol following bolus administration of intravenous contrast. CONTRAST:  134mL OMNIPAQUE IOHEXOL 300 MG/ML  SOLN COMPARISON:  04/08/2016 FINDINGS: Lower chest: No acute abnormality. Hepatobiliary: Scattered liver cysts are identified. Previous cholecystectomy. No significant biliary dilatation. Pancreas: Pancreas divisum anatomy is suspected. No main duct dilatation, inflammation or mass. Spleen: Spleen is unremarkable Adrenals/Urinary Tract: The adrenal glands are normal. No kidney mass or hydronephrosis. Urinary bladder is unremarkable. Stomach/Bowel: Stomach is unremarkable. Proximal small bowel loops appear normal within the lower abdomen and pelvis there are multiple  loops of small bowel which are increased in caliber measuring up to 2.3 cm with air-fluid levels. Gradual transition to normal to decreased caliber distal small bowel noted. There is abnormal wall thickening with surrounding fat stranding involving the descending colon and sigmoid colon concerning for colitis. No pneumatosis or bowel perforation. Vascular/Lymphatic: Aortic atherosclerosis. No aneurysm. No abdominopelvic adenopathy. Reproductive: Status post hysterectomy. No adnexal masses. Other: Trace free fluid noted within the pelvis. Musculoskeletal: No acute or significant osseous findings. IMPRESSION: 1. Abnormal wall thickening and surrounding fat stranding involving the descending colon and sigmoid colon compatible with colitis. No pneumatosis or bowel perforation. 2. Mildly dilated loops of small bowel with air-fluid levels. Findings may reflect ileus or partial small bowel obstruction. 3. Pancreas divisum anatomy. Aortic Atherosclerosis (ICD10-I70.0). Electronically Signed   By: Kerby Moors M.D.   On: 09/04/2019 17:36   DG Abd 2 Views  Result Date: 09/05/2019 CLINICAL DATA:  Small-bowel obstruction EXAM: ABDOMEN - 2 VIEW COMPARISON:  04/11/2016, 09/04/2019 FINDINGS: Supine and left lateral decubitus radiographs of the abdomen are obtained. Oral contrast administered for preceding CT has progressed throughout the colon. No evidence of small-bowel obstruction. The wall thickening of the distal colon seen on CT is not well appreciated by radiograph. No masses or abnormal calcifications. Left iliac stent unchanged. No free gas within the peritoneal cavity. IMPRESSION: 1. No evidence small bowel obstruction. 2. Please refer to preceding CT abdomen report describing distal colonic wall thickening. Electronically Signed   By: Randa Ngo M.D.   On: 09/05/2019 08:32     Assessment & Plan:  Plan  I am having Leah [name] maintain her sertraline, LORazepam, estradiol, aspirin, dicyclomine,  ezetimibe, clopidogrel, atorvastatin, famotidine, lisinopril, Repatha SureClick, and polyethylene glycol.  No orders of the defined types were placed in this encounter.   Problem List Items Addressed This Visit      Unprioritized   Current every day smoker   Relevant Orders   Ambulatory Referral for Lung Cancer Scre   Diverticulitis - Primary    Pt is asymptomatic  Has gi f/u       Relevant Orders   CBC with Differential/Platelet   Comprehensive metabolic panel   HLD (hyperlipidemia)    Encouraged heart healthy diet, increase exercise, avoid trans fats, consider a krill oil cap daily  Relevant Orders   Lipid panel   HTN (hypertension)    Well controlled, no changes to meds. Encouraged heart healthy diet such as the DASH diet and exercise as tolerated.       SBO (small bowel obstruction) (HCC)   Small bowel obstruction due to adhesions ([place])    F/u GI miralax bid per hosp          Follow-up: Return in about 6 months (around 03/18/2020), or if symptoms worsen or fail to improve, for annual exam, fasting.  [name], DO

## 2019-09-19 NOTE — Assessment & Plan Note (Signed)
Encouraged heart healthy diet, increase exercise, avoid trans fats, consider a krill oil cap daily 

## 2019-09-19 NOTE — Assessment & Plan Note (Signed)
F/u GI miralax bid per hosp

## 2019-09-19 NOTE — Assessment & Plan Note (Signed)
Well controlled, no changes to meds. Encouraged heart healthy diet such as the DASH diet and exercise as tolerated.  °

## 2019-09-24 ENCOUNTER — Ambulatory Visit: Payer: Medicare HMO | Admitting: Gastroenterology

## 2019-09-25 ENCOUNTER — Other Ambulatory Visit: Payer: Self-pay | Admitting: *Deleted

## 2019-09-25 DIAGNOSIS — F17210 Nicotine dependence, cigarettes, uncomplicated: Secondary | ICD-10-CM

## 2019-09-25 DIAGNOSIS — Z87891 Personal history of nicotine dependence: Secondary | ICD-10-CM

## 2019-10-07 ENCOUNTER — Ambulatory Visit: Payer: Medicare HMO | Admitting: Gastroenterology

## 2019-10-07 ENCOUNTER — Other Ambulatory Visit: Payer: Self-pay

## 2019-10-07 ENCOUNTER — Encounter: Payer: Self-pay | Admitting: Gastroenterology

## 2019-10-07 VITALS — BP 142/70 | HR 76 | Temp 97.8°F | Ht 61.0 in | Wt 112.8 lb

## 2019-10-07 DIAGNOSIS — K5909 Other constipation: Secondary | ICD-10-CM

## 2019-10-07 DIAGNOSIS — R1030 Lower abdominal pain, unspecified: Secondary | ICD-10-CM | POA: Diagnosis not present

## 2019-10-07 DIAGNOSIS — K56609 Unspecified intestinal obstruction, unspecified as to partial versus complete obstruction: Secondary | ICD-10-CM

## 2019-10-07 DIAGNOSIS — I700 Atherosclerosis of aorta: Secondary | ICD-10-CM | POA: Diagnosis not present

## 2019-10-07 NOTE — Patient Instructions (Signed)
If you are age 72 or older, your body mass index should be between 23-30. Your Body mass index is 21.31 kg/m. If this is out of the aforementioned range listed, please consider follow up with your Primary Care Provider.  If you are age 33 or younger, your body mass index should be between 19-25. Your Body mass index is 21.31 kg/m. If this is out of the aformentioned range listed, please consider follow up with your Primary Care Provider.   Please continue Miralax twice a day. If no BM, the next morning take a 5 mg Doculax.  Follow up as needed.  It was a pleasure to see you today!  Dr. Loletha Carrow

## 2019-10-07 NOTE — Progress Notes (Addendum)
Leah [name] GI Progress Note  Chief Complaint: Chronic constipation  Subjective  History: History of chronic abdominal pain and constipation.  Previous sigmoid resection for diverticulitis, poor preparation on most recent colonoscopy.  Has suspected decreased motility related to previous sigmoid resection, also atypical anastomosis anatomy with tortuosity and additional left colon redundancy.  Multiple prior abdominal pelvic surgeries and previous SBO that did not require further surgery.  Her previous sigmoid resection was reportedly complicated by prolonged abdominal wound healing. Seen in clinic February 4 with about 2 weeks of acute on chronic constipation and escalating generalized abdominal pain.  CT scan showed partial small bowel obstruction. Hospitalized several days, no NG tube drainage or further surgery required.  Colonoscopy performed, similar findings before, also still poor preparation with patient unable to tolerate total PEG prep.  Leah [name] is doing better after the recent hospital stay.  She still has chronic intermittent left lower quadrant pain, which I think she will likely have indefinitely.  On MiraLAX a capful twice daily, she has a bowel movement most days.  The longer she will goes 2 days since the recent hospital stay.  She denies rectal bleeding.  Appetite good and weight stable.  She had questions about the bowel obstruction and need for surgical consult.  ROS: Cardiovascular:  no chest pain Respiratory: no dyspnea.  Has chronic smoker's cough Anxiety The patient's Past Medical, Family and Social History were reviewed and are on file in the EMR. Continues to smoke Objective:  Med list reviewed  Current Outpatient Medications:    aspirin EC 81 MG EC tablet, Take 1 tablet (81 mg total) by mouth daily., Disp: , Rfl:    atorvastatin (LIPITOR) 80 MG tablet, TAKE 1 TABLET (80 MG TOTAL) BY MOUTH DAILY., Disp: 90 tablet, Rfl: 0   clopidogrel (PLAVIX) 75 MG  tablet, TAKE 1 TABLET BY MOUTH EVERY DAY (Patient taking differently: Take 75 mg by mouth daily. ), Disp: 90 tablet, Rfl: 3   dicyclomine (BENTYL) 10 MG capsule, Take 1 capsule (10 mg total) by mouth 2 (two) times daily as needed for spasms., Disp: 60 capsule, Rfl: 2   estradiol (CLIMARA - DOSED IN MG/24 HR) 0.1 mg/24hr patch, Place 0.1 mg onto the skin every Monday., Disp: , Rfl: 0   ezetimibe (ZETIA) 10 MG tablet, Take 1 tablet (10 mg total) by mouth daily., Disp: 90 tablet, Rfl: 1   famotidine (PEPCID) 20 MG tablet, TAKE 1 TABLET TWICE DAILY (Patient taking differently: Take 20 mg by mouth 2 (two) times daily. ), Disp: 180 tablet, Rfl: 0   lisinopril (ZESTRIL) 20 MG tablet, TAKE 1 TABLET BY MOUTH EVERY DAY (Patient taking differently: Take 20 mg by mouth daily. ), Disp: 90 tablet, Rfl: 1   LORazepam (ATIVAN) 0.5 MG tablet, Take 0.5 mg by mouth at bedtime. , Disp: , Rfl: 5   polyethylene glycol (MIRALAX) 17 g packet, Take 17 g by mouth 2 (two) times daily., Disp: 30 each, Rfl: 0   REPATHA SURECLICK XX123456 MG/ML SOAJ, INJECT 1 DOSE INTO THE SKIN EVERY 14 (FOURTEEN) DAYS. (Patient taking differently: Inject 140 mg into the skin every 14 (fourteen) days. 1 dose (140mg ) every 14 days), Disp: 2 pen, Rfl: 11   sertraline (ZOLOFT) 50 MG tablet, Take 25 mg by mouth at bedtime. , Disp: , Rfl:  No current facility-administered medications for this visit.  Facility-Administered Medications Ordered in Other Visits:    triamcinolone acetonide (KENALOG-40) injection (RADIOLOGY ONLY) 60 mg, 60 mg, Epidural, Once, Bergman,  Meghan D, NP   Vital signs in last 24 hrs: Vitals:   10/07/19 0827  BP: (!) 142/70  Pulse: 76  Temp: 97.8 F (36.6 C)    Physical Exam  Well-appearing.  Coarse vocal quality HEENT: sclera anicteric, oral mucosa moist without lesions Neck: supple, no thyromegaly, JVD or lymphadenopathy Cardiac: RRR without murmurs, S1S2 heard, no peripheral edema Pulm: clear to auscultation  bilaterally, normal RR and effort noted Abdomen: soft, LLQ tenderness, with active bowel sounds. No guarding or palpable hepatosplenomegaly.  Labs:   ___________________________________________ Radiologic studies:  CLINICAL DATA:  Abdominal pain, chills.   EXAM: CT ABDOMEN AND PELVIS WITH CONTRAST   TECHNIQUE: Multidetector CT imaging of the abdomen and pelvis was performed using the standard protocol following bolus administration of intravenous contrast.   CONTRAST:  181mL OMNIPAQUE IOHEXOL 300 MG/ML  SOLN   COMPARISON:  04/08/2016   FINDINGS: Lower chest: No acute abnormality.   Hepatobiliary: Scattered liver cysts are identified. Previous cholecystectomy. No significant biliary dilatation.   Pancreas: Pancreas divisum anatomy is suspected. No main duct dilatation, inflammation or mass.   Spleen: Spleen is unremarkable   Adrenals/Urinary Tract: The adrenal glands are normal. No kidney mass or hydronephrosis. Urinary bladder is unremarkable.   Stomach/Bowel: Stomach is unremarkable. Proximal small bowel loops appear normal within the lower abdomen and pelvis there are multiple loops of small bowel which are increased in caliber measuring up to 2.3 cm with air-fluid levels. Gradual transition to normal to decreased caliber distal small bowel noted. There is abnormal wall thickening with surrounding fat stranding involving the descending colon and sigmoid colon concerning for colitis. No pneumatosis or bowel perforation.   Vascular/Lymphatic: Aortic atherosclerosis. No aneurysm. No abdominopelvic adenopathy.   Reproductive: Status post hysterectomy. No adnexal masses.   Other: Trace free fluid noted within the pelvis.   Musculoskeletal: No acute or significant osseous findings.   IMPRESSION: 1. Abnormal wall thickening and surrounding fat stranding involving the descending colon and sigmoid colon compatible with colitis. No pneumatosis or bowel  perforation. 2. Mildly dilated loops of small bowel with air-fluid levels. Findings may reflect ileus or partial small bowel obstruction. 3. Pancreas divisum anatomy.   Aortic Atherosclerosis (ICD10-I70.0).     Electronically Signed   By: Kerby Moors M.D.   On: 09/04/2019 17:36  ____________________________________________ Other:   _____________________________________________ Assessment & Plan  Assessment: Encounter Diagnoses  Name Primary?   Lower abdominal pain Yes   Chronic constipation    SBO (small bowel obstruction) (HCC)     Lower abdominal pain, primarily left-sided, chronic constipation.  Related to previous sigmoid resection complicated by postop infection.  Suspect combination of dysmotility, tortuous anastomotic anatomy, and intra-abdominal adhesions.  She is at risk for further SBO, but I would not recommend elective surgery for consideration of lysis of adhesions.  It would be high risk for surgical entry to that abdominal cavity, and would reserve that for any high-grade SBO that was not responding to conservative management.  She will always need cathartic laxatives.  Recommend MiraLAX capful twice daily, and recommend she increase the water/fluid consumption with it.  If no BM for a day, and the following morning take bisacodyl 5 mg tablet along with the usual MiraLAX dosing. She has demonstrated inability to consume sufficient bowel preparation to get good visualization for polyp screening during colonoscopy.  See me as needed.   20 minutes were spent on this encounter (including chart review, history/exam, counseling/coordination of care, and documentation)  Nelida Meuse III

## 2019-10-13 ENCOUNTER — Other Ambulatory Visit: Payer: Self-pay

## 2019-10-13 ENCOUNTER — Ambulatory Visit (INDEPENDENT_AMBULATORY_CARE_PROVIDER_SITE_OTHER)
Admission: RE | Admit: 2019-10-13 | Discharge: 2019-10-13 | Disposition: A | Discharge status: Home / Self Care | Payer: Medicare HMO | Source: Ambulatory Visit | Attending: Acute Care | Admitting: Acute Care

## 2019-10-13 ENCOUNTER — Telehealth: Payer: Self-pay

## 2019-10-13 DIAGNOSIS — F17210 Nicotine dependence, cigarettes, uncomplicated: Secondary | ICD-10-CM

## 2019-10-13 DIAGNOSIS — Z87891 Personal history of nicotine dependence: Secondary | ICD-10-CM

## 2019-10-13 NOTE — Telephone Encounter (Signed)
Labs reviewed with patient.

## 2019-10-13 NOTE — Telephone Encounter (Signed)
Pt called about labs from 09/19/19. Please review and advise.

## 2019-10-13 NOTE — Telephone Encounter (Signed)
Normal labs.

## 2019-10-16 ENCOUNTER — Other Ambulatory Visit: Payer: Self-pay | Admitting: *Deleted

## 2019-10-16 DIAGNOSIS — Z87891 Personal history of nicotine dependence: Secondary | ICD-10-CM

## 2019-10-16 DIAGNOSIS — F17210 Nicotine dependence, cigarettes, uncomplicated: Secondary | ICD-10-CM

## 2019-10-16 NOTE — Progress Notes (Signed)
Please call patient and let them  know their  low dose Ct was read as a lung RADS 2.Please let them  know we will order and schedule their  annual screening scan for March 2022. Please let them  know there was notation of CAD on their  scan.  Please remind the patient  that this is a non-gated exam therefore degree or severity of disease  cannot be determined. Please have them  follow up with their PCP regarding potential risk factor modification, dietary therapy or pharmacologic therapy if clinically indicated. Pt.  is currently on statin therapy. Please place order for annual  screening scan for March 2022 and fax results to PCP. Thanks so much.

## 2019-10-17 ENCOUNTER — Encounter: Payer: Self-pay | Admitting: Internal Medicine

## 2019-10-17 ENCOUNTER — Ambulatory Visit: Payer: Medicare HMO | Admitting: Internal Medicine

## 2019-10-17 ENCOUNTER — Other Ambulatory Visit: Payer: Self-pay

## 2019-10-17 VITALS — BP 112/70 | HR 68 | Temp 97.7°F | Ht 61.5 in | Wt 109.6 lb

## 2019-10-17 DIAGNOSIS — I739 Peripheral vascular disease, unspecified: Secondary | ICD-10-CM | POA: Diagnosis not present

## 2019-10-17 DIAGNOSIS — E785 Hyperlipidemia, unspecified: Secondary | ICD-10-CM | POA: Diagnosis not present

## 2019-10-17 DIAGNOSIS — F17200 Nicotine dependence, unspecified, uncomplicated: Secondary | ICD-10-CM

## 2019-10-17 MED ORDER — ATORVASTATIN CALCIUM 40 MG PO TABS: 40.0000 mg | ORAL_TABLET | Freq: Every day | ORAL | 3 refills | Status: DC

## 2019-10-17 NOTE — Progress Notes (Signed)
LIPID CLINIC CONSULT NOTE  Chief Complaint:  Elevated triglycerides  Primary Care Physician: Carollee Herter, Alferd Apa, DO  HPI:  Leah [name] is a 74 y.o. female who is being seen today for the evaluation of high triglycerides at the request of [name], *. This is a pleasant 74 year old female with multiple cardiovascular risk factors.  She was referred today for elevated triglycerides however also has an high LDL cholesterol.  Her most recent lipid profile demonstrates total cholesterol 223, triglycerides 182, HDL 35 and LDL of 152.  This is in the setting of treatment with high potency atorvastatin 80 mg daily and ezetimibe 10 mg daily.  The combined reduction of these medications is about 80 to 90% suggesting that her LDL is more likely 250-300.  This is a finding highly suggestive of familial hyperlipidemia.  In fact, there is evidence for familial hyperlipidemia.  Her mother had significantly elevated cholesterol.  Her father also had coronary disease and had coronary artery bypass grafting.  She has a daughter who has significant PAD and high cholesterol and she also has sister and a brother both of which you have high cholesterol and heart problems.  She has 2 grandparents on her mother side who both had heart attack.  In addition she is a smoker between 1/2 pack to a full pack per day.  She also has some history of PAD and was found to have moderate nonobstructive plaque in the left lower extremity and has been reporting some symptoms of claudication.  In addition she has known coronary disease.  She is not had a prior MI, however had a screening CT scan of the chest in July 2019 to rule out cancer given her smoking history.  This demonstrated multivessel and left main coronary artery calcification, significant aortic atherosclerosis and other findings suggestive of cardiovascular disease.  She is not on aspirin due to a history of stomach upset.  Other cardiac vascular risk  factors include hypertension on lisinopril.  Currently she reports dyspnea on exertion but denies chest pain.  She also has had some fatigue.  10/17/2019  Leah [name] returns today for follow-up.  Her lipids have declined significantly.  Most recently her total cholesterol was 75, triglycerides 136, HDL 46 and LDL of 2.  This is similar to her numbers about 9 months ago.  She has not had a dose adjustment in her medications.  By convention we tend to not try to overtreat her with a extremely low LDL cholesterol.  She still should be on a high potency statin and therefore would recommend decreasing her Lipitor from 80 to 40 mg daily.  Ultimately we may be able to discontinue her ezetimibe due to excellent response to the Repatha.  PMHx:  Past Medical History:  Diagnosis Date  . Allergy   . Anemia   . Anxiety   . Barrett esophagus   . Bladder pain   . Chronic bronchitis ([place])    per pt last episode july 2016  . Complication of anesthesia    slow to wake  . COPD (chronic obstructive pulmonary disease) ([place])   . Depression   . Diverticulosis   . Frequency of urination   . GERD (gastroesophageal reflux disease)   . Hiatal hernia   . History of adenomatous polyp of colon    tubular adenoma  . History of chronic gastritis   . History of diverticulitis of colon   . History of esophageal dilatation  for stricture  . History of esophageal spasm   . Hyperlipidemia   . Hypertension   . IBS (irritable bowel syndrome)   . Interstitial cystitis   . PAD (peripheral artery disease) ([place])   . Productive cough    intermittant  . Smokers' cough ([place])   . Urgency of urination     Past Surgical History:  Procedure Laterality Date  . ABDOMINAL AORTOGRAM N/A 10/14/2018   Procedure: ABDOMINAL AORTOGRAM;  Surgeon: Lorretta Harp, MD;  Location: [facility];  Service: Cardiovascular;  Laterality: N/A;  . ABDOMINAL HYSTERECTOMY  1975  . ANTERIOR CERVICAL DECOMP/DISCECTOMY FUSION N/A  09/07/2014   Procedure: Exploration of Fusion and Removal of Anterior Cervical Hematoma;  Surgeon: Newman Pies, MD;  Location: [facility] NEURO [facility]S;  Service: Neurosurgery;  Laterality: N/A;  . CARPAL TUNNEL RELEASE Right 1993  . COLONOSCOPY  last one 04-30-2013  . COLONOSCOPY WITH PROPOFOL N/A 09/09/2019   Procedure: COLONOSCOPY WITH PROPOFOL;  Surgeon: Doran Stabler, MD;  Location: [facility];  Service: Gastroenterology;  Laterality: N/A;  . CYSTO WITH HYDRODISTENSION N/A 06/08/2015   Procedure: CYSTOSCOPY/HYDRODISTENSION INSTILLATION OF MARCAINE AND PYRIDIUM;  Surgeon: Irine Seal, MD;  Location: [place];  Service: Urology;  Laterality: N/A;  . CYSTO/  HOD/  BLADDER BX  1990's  . ESOPHAGOGASTRODUODENOSCOPY  last one 09-11-2012  . LAPAROSCOPIC APPENDECTOMY N/A 09/21/2013   Procedure: APPENDECTOMY LAPAROSCOPIC;  Surgeon: Rolm Bookbinder, MD;  Location: [place];  Service: General;  Laterality: N/A;  . LAPAROSCOPIC CHOLECYSTECTOMY  2002  . LAPAROSCOPY SIGMOID COLECTOMY  10-05-2008   diverticulitis  . LOWER EXTREMITY ANGIOGRAPHY Left 10/14/2018  . LOWER EXTREMITY ANGIOGRAPHY Bilateral 10/14/2018   Procedure: Lower Extremity Angiography;  Surgeon: Lorretta Harp, MD;  Location: [place] [facility];  Service: Cardiovascular;  Laterality: Bilateral;  ILIACS  . MICROLARYNGOSCOPY  09-27-2006   w/ true vocal cord stripping  and bilateral bx's of lesion's (benign)  . PERIPHERAL VASCULAR INTERVENTION  10/14/2018   Procedure: PERIPHERAL VASCULAR INTERVENTION;  Surgeon: Lorretta Harp, MD;  Location: [place] [facility];  Service: Cardiovascular;;  . SINUS SURGERY WITH INSTATRAK  1991  . [place]  . TONSILLECTOMY AND ADENOIDECTOMY  1964    FAMHx:  Family History  Problem Relation Age of Onset  . Pancreatic cancer Mother   . Colon polyps Mother   . Pancreatic cancer Father   . Diabetes Father   . Heart disease Father   . Leukemia Father   .  Prostate cancer Father   . Hypertension Father   . Colon cancer Neg Hx   . Esophageal cancer Neg Hx   . Rectal cancer Neg Hx   . Stomach cancer Neg Hx     SOCHx:   reports that she has been smoking cigarettes. She has a 35.25 pack-year smoking history. She has never used smokeless tobacco. She reports that she does not drink alcohol or use drugs.  ALLERGIES:  Allergies  Allergen Reactions  . Amoxicillin-Pot Clavulanate Shortness Of Breath, Itching and Swelling  . Avelox [Moxifloxacin Hcl In Nacl] Swelling    Swelling, itching and shortness of breath  . Ciprofloxacin Shortness Of Breath, Itching and Swelling  . Keflex [Cephalexin] Shortness Of Breath and Swelling  . Penicillins Shortness Of Breath, Itching and Swelling    Did it involve swelling of the face/tongue/throat, SOB, or low BP? Yes Did it involve sudden or severe rash/hives, skin peeling, or any reaction on the inside of  your mouth or nose? No Did you need to seek medical attention at a hospital or doctor's office? Yes When did it last happen?Several years If all above answers are "NO", may proceed with cephalosporin use.   . Sulfa Antibiotics Shortness Of Breath, Itching and Swelling  . Prednisone Other (See Comments)    GI irritation; is able to tolerate injections  . Acyclovir And Related Other (See Comments)    Pt does not recall this reaction  . Clindamycin/Lincomycin Itching and Swelling  . Levaquin [Levofloxacin] Hives  . Zyvox [Linezolid] Diarrhea    ROS: Pertinent items noted in HPI and remainder of comprehensive ROS otherwise negative.  HOME MEDS: Current Outpatient Medications on File Prior to Visit  Medication Sig Dispense Refill  . aspirin EC 81 MG EC tablet Take 1 tablet (81 mg total) by mouth daily.    Marland Kitchen atorvastatin (LIPITOR) 80 MG tablet TAKE 1 TABLET (80 MG TOTAL) BY MOUTH DAILY. 90 tablet 0  . clopidogrel (PLAVIX) 75 MG tablet TAKE 1 TABLET BY MOUTH EVERY DAY (Patient taking differently:  Take 75 mg by mouth daily. ) 90 tablet 3  . dicyclomine (BENTYL) 10 MG capsule Take 1 capsule (10 mg total) by mouth 2 (two) times daily as needed for spasms. 60 capsule 2  . estradiol (CLIMARA - DOSED IN MG/24 HR) 0.1 mg/24hr patch Place 0.1 mg onto the skin every Monday.  0  . ezetimibe (ZETIA) 10 MG tablet Take 1 tablet (10 mg total) by mouth daily. 90 tablet 1  . famotidine (PEPCID) 20 MG tablet TAKE 1 TABLET TWICE DAILY (Patient taking differently: Take 20 mg by mouth 2 (two) times daily. ) 180 tablet 0  . lisinopril (ZESTRIL) 20 MG tablet TAKE 1 TABLET BY MOUTH EVERY DAY (Patient taking differently: Take 20 mg by mouth daily. ) 90 tablet 1  . LORazepam (ATIVAN) 0.5 MG tablet Take 0.5 mg by mouth at bedtime.   5  . polyethylene glycol (MIRALAX) 17 g packet Take 17 g by mouth 2 (two) times daily. 30 each 0  . REPATHA SURECLICK [id number] MG/ML SOAJ INJECT 1 DOSE INTO THE SKIN EVERY 14 (FOURTEEN) DAYS. (Patient taking differently: Inject 140 mg into the skin every 14 (fourteen) days. 1 dose (140mg ) every 14 days) 2 pen 11  . sertraline (ZOLOFT) 50 MG tablet Take 25 mg by mouth at bedtime.      Current Facility-Administered Medications on File Prior to Visit  Medication Dose Route Frequency Provider Last Rate Last Admin  . triamcinolone acetonide (KENALOG-40) injection (RADIOLOGY ONLY) 60 mg  60 mg Epidural Once Bergman, Meghan D, NP        LABS/IMAGING: No results found for this or any previous visit (from the past 48 hour(s)). No results found.  LIPID PANEL:    Component Value Date/Time   CHOL 75 09/19/2019 1157   TRIG 136.0 09/19/2019 1157   HDL 46.60 09/19/2019 1157   CHOLHDL 2 09/19/2019 1157   VLDL 27.2 09/19/2019 1157   LDLCALC 2 09/19/2019 1157   LDLDIRECT 151.0 11/07/2016 1647    WEIGHTS: Wt Readings from Last 3 Encounters:  10/17/19 109 lb 9.6 oz (49.7 kg)  10/07/19 112 lb 12.8 oz (51.2 kg)  09/19/19 110 lb 9.6 oz (50.2 kg)    VITALS: BP 112/70   Pulse 68   Temp 97.7 F  (36.5 C)   Ht 5' 1.5" (1.562 m)   Wt 109 lb 9.6 oz (49.7 kg)   SpO2 99%   BMI 20.37  kg/m   EXAM: Deferred  EKG: Deferred  ASSESSMENT: 1. Definite HeFH - Dutch score of 9 2. Multiple vessel coronary artery calcification/aortic atherosclerosis 3. PAD with left lower extremity claudication 4. Ongoing tobacco abuse 5. Hypertension  PLAN: 1.   Leah [name] has had marked improvement in her lipids.  Her LDLs remain very low.  There really is not any additional benefit to dry the LDL this low that we have shown in studies at this point.  About 9 months ago it was 13 and now down to 2.  At this point I would recommend decreasing her atorvastatin from 80 to 40 mg daily.  Most likely the LDL will come up into the 10-20 range.  Otherwise continue on Repatha and ezetimibe for now.  If the LDL remains persistently low we could consider stopping ezetimibe as well.  Overall her risk should be significantly reduced.  We also talked today about smoking cessation.  She says she is cut back but is not quite ready to quit.  Follow-up with me annually or sooner as necessary.  Pixie Casino, MD, [facility], [place] Director of the Advanced Lipid Disorders &  Cardiovascular Risk Reduction Clinic Diplomate of the American Board of Clinical Lipidology Attending Cardiologist  Direct Dial: [phone]  Fax: [phone]  Website:  [email or web address] 10/17/2019, 11:28 AM

## 2019-10-17 NOTE — Patient Instructions (Signed)
Medication Instructions:  DECREASE atorvastatin to 40mg  daily Continue other current medications  *If you need a refill on your cardiac medications before your next appointment, please call your pharmacy*   Lab Work: FASTING lab work to check cholesterol in about 1 year - to be completed before next visit with Dr. Debara Pickett   If you have labs (blood work) drawn today and your tests are completely normal, you will receive your results only by: Marland Kitchen MyChart Message (if you have MyChart) OR . A paper copy in the mail If you have any lab test that is abnormal or we need to change your treatment, we will call you to review the results.   Testing/Procedures: NONE   Follow-Up: At [facility], you and your health needs are our priority.  As part of our continuing mission to provide you with exceptional heart care, we have created designated Provider Care Teams.  These Care Teams include your primary Cardiologist (physician) and Advanced Practice Providers (APPs -  Physician Assistants and Nurse Practitioners) who all work together to provide you with the care you need, when you need it.  We recommend signing up for the patient portal called "MyChart".  Sign up information is provided on this After Visit Summary.  MyChart is used to connect with patients for Virtual Visits (Telemedicine).  Patients are able to view lab/test results, encounter notes, upcoming appointments, etc.  Non-urgent messages can be sent to your provider as well.   To learn more about what you can do with MyChart, go to [email or web address].    Your next appointment:   12 month(s) - lipid clinic  The format for your next appointment:   Either In Person or Virtual  Provider:   K. Mali Hilty, MD   Other Instructions

## 2019-10-22 DIAGNOSIS — Z01419 Encounter for gynecological examination (general) (routine) without abnormal findings: Secondary | ICD-10-CM | POA: Diagnosis not present

## 2019-10-22 DIAGNOSIS — Z780 Asymptomatic menopausal state: Secondary | ICD-10-CM | POA: Diagnosis not present

## 2019-10-22 DIAGNOSIS — F17210 Nicotine dependence, cigarettes, uncomplicated: Secondary | ICD-10-CM | POA: Diagnosis not present

## 2019-11-06 DIAGNOSIS — M7072 Other bursitis of hip, left hip: Secondary | ICD-10-CM | POA: Diagnosis not present

## 2019-11-06 DIAGNOSIS — M25552 Pain in left hip: Secondary | ICD-10-CM | POA: Diagnosis not present

## 2019-11-12 DIAGNOSIS — N3000 Acute cystitis without hematuria: Secondary | ICD-10-CM | POA: Diagnosis not present

## 2019-11-17 DIAGNOSIS — N3010 Interstitial cystitis (chronic) without hematuria: Secondary | ICD-10-CM | POA: Diagnosis not present

## 2019-12-27 ENCOUNTER — Other Ambulatory Visit: Payer: Self-pay | Admitting: Family Medicine

## 2019-12-27 DIAGNOSIS — I10 Essential (primary) hypertension: Secondary | ICD-10-CM

## 2020-01-06 ENCOUNTER — Other Ambulatory Visit: Payer: Self-pay | Admitting: Gastroenterology

## 2020-01-23 ENCOUNTER — Telehealth: Payer: Self-pay | Admitting: Gastroenterology

## 2020-01-23 NOTE — Telephone Encounter (Signed)
Pt states she is taking 2 doses of miralax and her last good bm was 4 days ago. Reports this am she had a small bm. Discussed with her she can titrate 1-3 doses of miralax to have BM. Pt will try up to 3 doses of miralax to see if that helps, she knows to call back if still having problems.

## 2020-01-23 NOTE — Telephone Encounter (Signed)
Pt states that she has been dealing with constipation again despite taking miralax. Pls call her.

## 2020-02-24 DIAGNOSIS — F325 Major depressive disorder, single episode, in full remission: Secondary | ICD-10-CM | POA: Diagnosis not present

## 2020-03-03 ENCOUNTER — Other Ambulatory Visit: Payer: Self-pay | Admitting: Cardiovascular Disease

## 2020-03-03 ENCOUNTER — Ambulatory Visit (HOSPITAL_BASED_OUTPATIENT_CLINIC_OR_DEPARTMENT_OTHER)
Admission: RE | Admit: 2020-03-03 | Discharge: 2020-03-03 | Disposition: A | Discharge status: Home / Self Care | Payer: Medicare HMO | Source: Ambulatory Visit | Attending: Cardiovascular Disease | Admitting: Cardiovascular Disease

## 2020-03-03 ENCOUNTER — Other Ambulatory Visit: Payer: Self-pay

## 2020-03-03 ENCOUNTER — Ambulatory Visit (HOSPITAL_COMMUNITY)
Admission: RE | Admit: 2020-03-03 | Discharge: 2020-03-03 | Disposition: A | Discharge status: Home / Self Care | Payer: Medicare HMO | Source: Ambulatory Visit | Attending: Cardiovascular Disease | Admitting: Cardiovascular Disease

## 2020-03-03 DIAGNOSIS — I739 Peripheral vascular disease, unspecified: Secondary | ICD-10-CM | POA: Diagnosis not present

## 2020-03-04 DIAGNOSIS — N3010 Interstitial cystitis (chronic) without hematuria: Secondary | ICD-10-CM | POA: Diagnosis not present

## 2020-03-12 ENCOUNTER — Other Ambulatory Visit: Payer: Self-pay

## 2020-03-16 ENCOUNTER — Other Ambulatory Visit: Payer: Self-pay

## 2020-03-16 MED ORDER — CLOPIDOGREL BISULFATE 75 MG PO TABS: 75.0000 mg | ORAL_TABLET | Freq: Every day | ORAL | 2 refills | Status: DC

## 2020-03-17 ENCOUNTER — Telehealth: Payer: Self-pay | Admitting: Gastroenterology

## 2020-03-17 NOTE — Telephone Encounter (Signed)
Most likely a viral infection, which will typically pass within several days. She has a history of adhesions and recurrent SBO, so I agree she should be cautious with anti-diarrheal medicine. She was prescribed some dicyclomine in June, and could take one of those twice a day for the next few days. If continues to have diarrhea by early next week, let us know and we will get some stool studies.

## 2020-03-17 NOTE — Telephone Encounter (Signed)
Spoke with pt and she is aware.

## 2020-03-17 NOTE — Telephone Encounter (Signed)
Pt states Monday AM she had a normal stool but Monday night she started having diarrhea and it has continued for the past 2 days. Pt has not taken anything OTC for the diarrhea as she states she has  "colon problems" and didn't know what to take. She states she has not been on any antibiotics recently. Please  Advise.

## 2020-03-18 ENCOUNTER — Ambulatory Visit: Payer: Medicare HMO | Admitting: Family Medicine

## 2020-03-18 ENCOUNTER — Telehealth: Payer: Self-pay | Admitting: *Deleted

## 2020-03-18 DIAGNOSIS — I10 Essential (primary) hypertension: Secondary | ICD-10-CM

## 2020-03-18 MED ORDER — LISINOPRIL 20 MG PO TABS: 20.0000 mg | ORAL_TABLET | Freq: Every day | ORAL | 1 refills | Status: DC

## 2020-03-18 NOTE — Telephone Encounter (Signed)
Received fax from [facility] mail order requesting new RX for Lisinopril. Refill sent.

## 2020-03-22 DIAGNOSIS — J324 Chronic pansinusitis: Secondary | ICD-10-CM | POA: Diagnosis not present

## 2020-03-25 ENCOUNTER — Ambulatory Visit: Payer: Medicare HMO | Admitting: Family Medicine

## 2020-04-01 ENCOUNTER — Ambulatory Visit (INDEPENDENT_AMBULATORY_CARE_PROVIDER_SITE_OTHER): Payer: Medicare HMO | Admitting: Family Medicine

## 2020-04-01 ENCOUNTER — Other Ambulatory Visit: Payer: Self-pay

## 2020-04-01 ENCOUNTER — Encounter: Payer: Self-pay | Admitting: Family Medicine

## 2020-04-01 VITALS — BP 108/62 | HR 59 | Temp 97.8°F | Resp 18 | Ht 61.5 in | Wt 105.8 lb

## 2020-04-01 DIAGNOSIS — I10 Essential (primary) hypertension: Secondary | ICD-10-CM

## 2020-04-01 DIAGNOSIS — R1013 Epigastric pain: Secondary | ICD-10-CM

## 2020-04-01 DIAGNOSIS — E785 Hyperlipidemia, unspecified: Secondary | ICD-10-CM

## 2020-04-01 DIAGNOSIS — J0141 Acute recurrent pansinusitis: Secondary | ICD-10-CM | POA: Diagnosis not present

## 2020-04-01 MED ORDER — FAMOTIDINE 20 MG PO TABS: 20.0000 mg | ORAL_TABLET | Freq: Two times a day (BID) | ORAL | 3 refills | Status: DC

## 2020-04-01 MED ORDER — DOXYCYCLINE HYCLATE 100 MG PO TABS: 100.0000 mg | ORAL_TABLET | Freq: Two times a day (BID) | ORAL | 0 refills | Status: DC

## 2020-04-01 NOTE — Progress Notes (Signed)
Patient ID: Leah [name], female    DOB: 1945/11/26  Age: 74 y.o. MRN: [id number]    Subjective:  Subjective  HPI [name] presents for f/u bp and cholesterol.  She needs a refill on her pepcid also  Pt also c/o con't sinus symptoms --- [facility] and was given a steroid shot but it did not help    Review of Systems  Constitutional: Negative for appetite change, diaphoresis, fatigue and unexpected weight change.  Eyes: Negative for pain, redness and visual disturbance.  Respiratory: Negative for cough, chest tightness, shortness of breath and wheezing.   Cardiovascular: Negative for chest pain, palpitations and leg swelling.  Endocrine: Negative for cold intolerance, heat intolerance, polydipsia, polyphagia and polyuria.  Genitourinary: Negative for difficulty urinating, dysuria and frequency.  Neurological: Negative for dizziness, light-headedness, numbness and headaches.    History Past Medical History:  Diagnosis Date  . Allergy   . Anemia   . Anxiety   . Barrett esophagus   . Bladder pain   . Chronic bronchitis ([place])    per pt last episode july 2016  . Complication of anesthesia    slow to wake  . COPD (chronic obstructive pulmonary disease) ([place])   . Depression   . Diverticulosis   . Frequency of urination   . GERD (gastroesophageal reflux disease)   . Hiatal hernia   . History of adenomatous polyp of colon    tubular adenoma  . History of chronic gastritis   . History of diverticulitis of colon   . History of esophageal dilatation    for stricture  . History of esophageal spasm   . Hyperlipidemia   . Hypertension   . IBS (irritable bowel syndrome)   . Interstitial cystitis   . PAD (peripheral artery disease) ([place])   . Productive cough    intermittant  . Smokers' cough ([place])   . Urgency of urination     She has a past surgical history that includes Carpal tunnel release (Right, 1993); Sinus surgery with Instatrak (1991);  laparoscopic appendectomy (N/A, 09/21/2013); Anterior cervical decomp/discectomy fusion (N/A, 09/07/2014); Tonsillectomy and adenoidectomy (1964); Laparoscopic cholecystectomy (2002); Abdominal hysterectomy (1975); Temporomandibular joint surgery (1990); LAPAROSCOPY SIGMOID COLECTOMY (10-05-2008); Microlaryngoscopy (09-27-2006); Colonoscopy (last one 04-30-2013); Esophagogastroduodenoscopy (last one 09-11-2012); CYSTO/  HOD/  BLADDER BX (1990's); cysto with hydrodistension (N/A, 06/08/2015); Lower Extremity Angiography (Left, 10/14/2018); PERIPHERAL VASCULAR INTERVENTION (10/14/2018); ABDOMINAL AORTOGRAM (N/A, 10/14/2018); Lower Extremity Angiography (Bilateral, 10/14/2018); and Colonoscopy with propofol (N/A, 09/09/2019).   Her family history includes Colon polyps in her mother; Diabetes in her father; Heart disease in her father; Hypertension in her father; Leukemia in her father; Pancreatic cancer in her father and mother; Prostate cancer in her father.She reports that she has been smoking cigarettes. She has a 35.25 pack-year smoking history. She has never used smokeless tobacco. She reports that she does not drink alcohol and does not use drugs.  Current Outpatient Medications on File Prior to Visit  Medication Sig Dispense Refill  . aspirin EC 81 MG EC tablet Take 1 tablet (81 mg total) by mouth daily.    Marland Kitchen atorvastatin (LIPITOR) 40 MG tablet Take 1 tablet (40 mg total) by mouth daily. (Patient taking differently: Take 20 mg by mouth daily. Pt states taking 20 MG) 90 tablet 3  . clopidogrel (PLAVIX) 75 MG tablet Take 1 tablet (75 mg total) by mouth daily. 90 tablet 2  . dicyclomine (BENTYL) 10 MG capsule TAKE 1 CAPSULE  BY MOUTH AS NEEDED FOR UP TO 1 DOSE FOR SPASMS. 90 capsule 1  . estradiol (CLIMARA - DOSED IN MG/24 HR) 0.1 mg/24hr patch Place 0.1 mg onto the skin every Monday.  0  . ezetimibe (ZETIA) 10 MG tablet Take 1 tablet (10 mg total) by mouth daily. 90 tablet 1  . lisinopril (ZESTRIL) 20 MG tablet  Take 1 tablet (20 mg total) by mouth daily. 90 tablet 1  . LORazepam (ATIVAN) 0.5 MG tablet Take 0.5 mg by mouth at bedtime.   5  . polyethylene glycol (MIRALAX) 17 g packet Take 17 g by mouth 2 (two) times daily. 30 each 0  . REPATHA SURECLICK 824 MG/ML SOAJ INJECT 1 DOSE INTO THE SKIN EVERY 14 (FOURTEEN) DAYS. (Patient taking differently: Inject 140 mg into the skin every 14 (fourteen) days. 1 dose (140mg ) every 14 days) 2 pen 11  . sertraline (ZOLOFT) 50 MG tablet Take 25 mg by mouth at bedtime.      Current Facility-Administered Medications on File Prior to Visit  Medication Dose Route Frequency Provider Last Rate Last Admin  . triamcinolone acetonide (KENALOG-40) injection (RADIOLOGY ONLY) 60 mg  60 mg Epidural Once Bergman, Meghan D, NP         Objective:  Objective  Physical Exam Vitals and nursing note reviewed.  Constitutional:      Appearance: She is well-developed.  HENT:     Head: Normocephalic and atraumatic.     Nose:     Right Sinus: Maxillary sinus tenderness and frontal sinus tenderness present.     Left Sinus: Maxillary sinus tenderness and frontal sinus tenderness present.  Eyes:     Conjunctiva/sclera: Conjunctivae normal.  Neck:     Thyroid: No thyromegaly.     Vascular: No carotid bruit or JVD.  Cardiovascular:     Rate and Rhythm: Normal rate and regular rhythm.     Heart sounds: Normal heart sounds. No murmur heard.   Pulmonary:     Effort: Pulmonary effort is normal. No respiratory distress.     Breath sounds: Normal breath sounds. No wheezing or rales.  Chest:     Chest wall: No tenderness.  Musculoskeletal:     Cervical back: Normal range of motion and neck supple.  Neurological:     Mental Status: She is alert and oriented to person, place, and time.    BP 108/62 (BP Location: Right Arm, Patient Position: Sitting, Cuff Size: Normal)   Pulse (!) 59   Temp 97.8 F (36.6 C) (Oral)   Resp 18   Ht 5' 1.5" (1.562 m)   Wt 105 lb 12.8 oz (48 kg)    SpO2 98%   BMI 19.67 kg/m  Wt Readings from Last 3 Encounters:  04/01/20 105 lb 12.8 oz (48 kg)  10/17/19 109 lb 9.6 oz (49.7 kg)  10/07/19 112 lb 12.8 oz (51.2 kg)     Lab Results  Component Value Date   WBC 6.7 09/19/2019   HGB 13.4 09/19/2019   HCT 41.5 09/19/2019   PLT 247.0 09/19/2019   GLUCOSE 96 09/19/2019   CHOL 75 09/19/2019   TRIG 136.0 09/19/2019   HDL 46.60 09/19/2019   LDLDIRECT 151.0 11/07/2016   LDLCALC 2 09/19/2019   ALT 13 09/19/2019   AST 16 09/19/2019   NA 138 09/19/2019   K 4.5 09/19/2019   CL 102 09/19/2019   CREATININE 0.78 09/19/2019   BUN 10 09/19/2019   CO2 31 09/19/2019   TSH 3.976 09/05/2019  INR 1.0 05/21/2009   HGBA1C 6.1 12/18/2017   MICROALBUR <0.7 11/09/2014    VAS [place] ABI WITH/WO TBI  Result Date: 03/05/2020 LOWER EXTREMITY DOPPLER STUDY Indications: Peripheral artery disease, s/p left common iliac stenting. Patient              says her legs are feeling "good", no complaints today. High Risk Factors: Hypertension, hyperlipidemia, current smoker, coronary artery                    disease.  Vascular Interventions: S/P PTA and covered stenting of left common iliac artery                         CTO using VBX stents on 10/14/18. Comparison Study: Previous ABIs performed 03/04/19 were 1.04 on the right and 1.11                   on the left. Performing Technologist: Mariane Masters RVT  Examination Guidelines: A complete evaluation includes at minimum, Doppler waveform signals and systolic blood pressure reading at the level of bilateral brachial, anterior tibial, and posterior tibial arteries, when vessel segments are accessible. Bilateral testing is considered an integral part of a complete examination. Photoelectric Plethysmograph (PPG) waveforms and toe systolic pressure readings are included as required and additional duplex testing as needed. Limited examinations for reoccurring indications may be performed as noted.  ABI Findings:  +---------+------------------+-----+---------+--------+ Right    Rt Pressure (mmHg)IndexWaveform Comment  +---------+------------------+-----+---------+--------+ Brachial 151                                      +---------+------------------+-----+---------+--------+ ATA      171               1.12 triphasic         +---------+------------------+-----+---------+--------+ PTA      171               1.12 biphasic          +---------+------------------+-----+---------+--------+ PERO     176               1.16 triphasic         +---------+------------------+-----+---------+--------+ Great Toe76                0.50 Abnormal          +---------+------------------+-----+---------+--------+ +---------+------------------+-----+---------+-------+ Left     Lt Pressure (mmHg)IndexWaveform Comment +---------+------------------+-----+---------+-------+ Brachial 152                                     +---------+------------------+-----+---------+-------+ ATA      169               1.11 biphasic         +---------+------------------+-----+---------+-------+ PTA      163               1.07 triphasic        +---------+------------------+-----+---------+-------+ PERO     164               1.08 biphasic         +---------+------------------+-----+---------+-------+ Great Toe75                0.49 Abnormal         +---------+------------------+-----+---------+-------+ +-------+-----------+-----------+------------+------------+ ABI/TBIToday's  ABIToday's TBIPrevious ABIPrevious TBI +-------+-----------+-----------+------------+------------+ Right  1.16       0.50       1.04        0.46         +-------+-----------+-----------+------------+------------+ Left   1.11       0.49       1.11        0.62         +-------+-----------+-----------+------------+------------+ Bilateral ABIs and TBIs appear essentially unchanged compared to prior study on  03/04/19.  Summary: Right: Resting right ankle-brachial index is within normal range. No evidence of significant right lower extremity arterial disease. The right toe-brachial index is abnormal. Left: Resting left ankle-brachial index is within normal range. No evidence of significant left lower extremity arterial disease. The left toe-brachial index is abnormal.  *See table(s) above for measurements and observations. See aorta-iliac report.  Suggest follow up study in 12 months. Electronically signed by Carlyle Dolly MD on 03/05/2020 at 12:26:20 PM.    Final    VAS US AORTA/IVC/ILIACS  Result Date: 03/05/2020 ABDOMINAL AORTA STUDY Indications: Follow-up left common iliac stenting. Patient says her legs are              feeling "good", no complaints today. Risk         Hypertension, hyperlipidemia, current smoker, coronary artery Factors:     disease. Other Factors: Today's ABIs are 1.16 on the right and 1.11 on the left. Vascular Interventions: S/P PTA and covered stenting of the left common iliac                         artery CTO using VBX stents on 10/14/18.  Comparison Study: Previous aorta-iliac duplex performed 11/04/18 showed PSV 338                   cm/sec in the right common iliac artery. Duplex 03/04/19 showed                   widely patent left common iliac artery, s/p stent with PSV of                   141 cm/sec. Performing Technologist: Mariane Masters RVT  Examination Guidelines: A complete evaluation includes B-mode imaging, spectral Doppler, color Doppler, and power Doppler as needed of all accessible portions of each vessel. Bilateral testing is considered an integral part of a complete examination. Limited examinations for reoccurring indications may be performed as noted.  Abdominal Aorta Findings: +-------------+-------+----------+----------+--------+--------+-------------+ Location     AP (cm)Trans (cm)PSV (cm/s)WaveformThrombusComments       +-------------+-------+----------+----------+--------+--------+-------------+ Proximal     2.10   1.90      56        biphasic                      +-------------+-------+----------+----------+--------+--------+-------------+ Mid          1.50   1.70      77        biphasic                      +-------------+-------+----------+----------+--------+--------+-------------+ Distal       2.00   1.90      82        biphasic        ectatic       +-------------+-------+----------+----------+--------+--------+-------------+ RT CIA Prox  0.9    1.0  207       biphasic                      +-------------+-------+----------+----------+--------+--------+-------------+ RT CIA Mid                    332       biphasic        >50% stenosis +-------------+-------+----------+----------+--------+--------+-------------+ RT CIA Distal                 138       biphasic                      +-------------+-------+----------+----------+--------+--------+-------------+ RT EIA Prox                   131       biphasic                      +-------------+-------+----------+----------+--------+--------+-------------+ RT EIA Mid                    115       biphasic                      +-------------+-------+----------+----------+--------+--------+-------------+ RT EIA Distal                 122       biphasic                      +-------------+-------+----------+----------+--------+--------+-------------+ LT EIA Prox                   148       biphasic                      +-------------+-------+----------+----------+--------+--------+-------------+ LT EIA Mid                    164       biphasic                      +-------------+-------+----------+----------+--------+--------+-------------+ LT EIA Distal                 119       biphasic                      +-------------+-------+----------+----------+--------+--------+-------------+  IVC/Iliac Findings: +--------+------+--------+--------+   IVC   PatentThrombusComments +--------+------+--------+--------+ IVC Proxpatent                 +--------+------+--------+--------+    Left Stent(s): +-----------------+--------+--------+--------+------------+ Left common iliacPSV cm/sStenosisWaveformComments     +-----------------+--------+--------+--------+------------+ Prox to Stent    101             biphasicdistal aorta +-----------------+--------+--------+--------+------------+ Proximal Stent   190             biphasic             +-----------------+--------+--------+--------+------------+ Mid Stent        142             biphasic             +-----------------+--------+--------+--------+------------+ Distal Stent     160             biphasic             +-----------------+--------+--------+--------+------------+ Distal to Stent  138  biphasicbowel gas    +-----------------+--------+--------+--------+------------+    Summary: Abdominal Aorta: The largest aortic measurement is 2.0 cm. Stenosis: +--------------------+-------------+-----------+------------------+ Location            Stenosis     Stent      Comments           +--------------------+-------------+-----------+------------------+ Right Common Iliac  >50% stenosis           essentially stable +--------------------+-------------+-----------+------------------+ Left Common Iliac                no stenosis                   +--------------------+-------------+-----------+------------------+ Right External Iliac<50% stenosis                              +--------------------+-------------+-----------+------------------+ Left External Iliac <50% stenosis                              +--------------------+-------------+-----------+------------------+ The left common iliac artery is patent, s/p stenting. IVC/Iliac: There is no evidence of thrombus involving the IVC.  *See  table(s) above for measurements and observations. Suggest follow up study in 12 months.  Electronically signed by Carlyle Dolly MD on 03/05/2020 at 12:57:59 PM.    Final      Assessment & Plan:  Plan  I have changed [name]. [place]'s famotidine. I am also having her start on doxycycline. Additionally, I am having her maintain her sertraline, LORazepam, estradiol, aspirin, ezetimibe, Repatha SureClick, polyethylene glycol, atorvastatin, dicyclomine, clopidogrel, and lisinopril.  Meds ordered this encounter  Medications  . famotidine (PEPCID) 20 MG tablet    Sig: Take 1 tablet (20 mg total) by mouth 2 (two) times daily.    Dispense:  180 tablet    Refill:  3  . doxycycline (VIBRA-TABS) 100 MG tablet    Sig: Take 1 tablet (100 mg total) by mouth 2 (two) times daily.    Dispense:  20 tablet    Refill:  0    Problem List Items Addressed This Visit      Unprioritized   Dyslipidemia    Encouraged heart healthy diet, increase exercise, avoid trans fats, consider a krill oil cap daily      Relevant Orders   CBC with Differential/Platelet   Comprehensive metabolic panel   Lipid panel   HTN (hypertension) - Primary    Well controlled, no changes to meds. Encouraged heart healthy diet such as the DASH diet and exercise as tolerated.       Relevant Orders   CBC with Differential/Platelet   Comprehensive metabolic panel   Lipid panel   Sinusitis, acute    abx per orders  flonase  Call if symptoms persist      Relevant Medications   doxycycline (VIBRA-TABS) 100 MG tablet    Other Visit Diagnoses    Dyspepsia       Relevant Medications   famotidine (PEPCID) 20 MG tablet      Follow-up: Return in about 6 months (around 09/29/2020), or if symptoms worsen or fail to improve, for annual exam, fasting.  [name], DO

## 2020-04-01 NOTE — Assessment & Plan Note (Signed)
abx per orders  flonase  Call if symptoms persist

## 2020-04-01 NOTE — Assessment & Plan Note (Signed)
Encouraged heart healthy diet, increase exercise, avoid trans fats, consider a krill oil cap daily 

## 2020-04-01 NOTE — Assessment & Plan Note (Signed)
Well controlled, no changes to meds. Encouraged heart healthy diet such as the DASH diet and exercise as tolerated.  °

## 2020-04-01 NOTE — Patient Instructions (Signed)
Sinusitis, Adult Sinusitis is inflammation of your sinuses. Sinuses are hollow spaces in the bones around your face. Your sinuses are located:  Around your eyes.  In the middle of your forehead.  Behind your nose.  In your cheekbones. Mucus normally drains out of your sinuses. When your nasal tissues become inflamed or swollen, mucus can become trapped or blocked. This allows bacteria, viruses, and fungi to grow, which leads to infection. Most infections of the sinuses are caused by a virus. Sinusitis can develop quickly. It can last for up to 4 weeks (acute) or for more than 12 weeks (chronic). Sinusitis often develops after a cold. What are the causes? This condition is caused by anything that creates swelling in the sinuses or stops mucus from draining. This includes:  Allergies.  Asthma.  Infection from bacteria or viruses.  Deformities or blockages in your nose or sinuses.  Abnormal growths in the nose (nasal polyps).  Pollutants, such as chemicals or irritants in the air.  Infection from fungi (rare). What increases the risk? You are more likely to develop this condition if you:  Have a weak body defense system (immune system).  Do a lot of swimming or diving.  Overuse nasal sprays.  Smoke. What are the signs or symptoms? The main symptoms of this condition are pain and a feeling of pressure around the affected sinuses. Other symptoms include:  Stuffy nose or congestion.  Thick drainage from your nose.  Swelling and warmth over the affected sinuses.  Headache.  Upper toothache.  A cough that may get worse at night.  Extra mucus that collects in the throat or the back of the nose (postnasal drip).  Decreased sense of smell and taste.  Fatigue.  A fever.  Sore throat.  Bad breath. How is this diagnosed? This condition is diagnosed based on:  Your symptoms.  Your medical history.  A physical exam.  Tests to find out if your condition is  acute or chronic. This may include: ? Checking your nose for nasal polyps. ? Viewing your sinuses using a device that has a light (endoscope). ? Testing for allergies or bacteria. ? Imaging tests, such as an MRI or CT scan. In rare cases, a bone biopsy may be done to rule out more serious types of fungal sinus disease. How is this treated? Treatment for sinusitis depends on the cause and whether your condition is chronic or acute.  If caused by a virus, your symptoms should go away on their own within 10 days. You may be given medicines to relieve symptoms. They include: ? Medicines that shrink swollen nasal passages (topical intranasal decongestants). ? Medicines that treat allergies (antihistamines). ? A spray that eases inflammation of the nostrils (topical intranasal corticosteroids). ? Rinses that help get rid of thick mucus in your nose (nasal saline washes).  If caused by bacteria, your health care provider may recommend waiting to see if your symptoms improve. Most bacterial infections will get better without antibiotic medicine. You may be given antibiotics if you have: ? A severe infection. ? A weak immune system.  If caused by narrow nasal passages or nasal polyps, you may need to have surgery. Follow these instructions at home: Medicines  Take, use, or apply over-the-counter and prescription medicines only as told by your health care provider. These may include nasal sprays.  If you were prescribed an antibiotic medicine, take it as told by your health care provider. Do not stop taking the antibiotic even if you start   to feel better. Hydrate and humidify   Drink enough fluid to keep your urine pale yellow. Staying hydrated will help to thin your mucus.  Use a cool mist humidifier to keep the humidity level in your home above 50%.  Inhale steam for 10-15 minutes, 3-4 times a day, or as told by your health care provider. You can do this in the bathroom while a hot shower is  running.  Limit your exposure to cool or dry air. Rest  Rest as much as possible.  Sleep with your head raised (elevated).  Make sure you get enough sleep each night. General instructions   Apply a warm, moist washcloth to your face 3-4 times a day or as told by your health care provider. This will help with discomfort.  Wash your hands often with soap and water to reduce your exposure to germs. If soap and water are not available, use hand sanitizer.  Do not smoke. Avoid being around people who are smoking (secondhand smoke).  Keep all follow-up visits as told by your health care provider. This is important. Contact a health care provider if:  You have a fever.  Your symptoms get worse.  Your symptoms do not improve within 10 days. Get help right away if:  You have a severe headache.  You have persistent vomiting.  You have severe pain or swelling around your face or eyes.  You have vision problems.  You develop confusion.  Your neck is stiff.  You have trouble breathing. Summary  Sinusitis is soreness and inflammation of your sinuses. Sinuses are hollow spaces in the bones around your face.  This condition is caused by nasal tissues that become inflamed or swollen. The swelling traps or blocks the flow of mucus. This allows bacteria, viruses, and fungi to grow, which leads to infection.  If you were prescribed an antibiotic medicine, take it as told by your health care provider. Do not stop taking the antibiotic even if you start to feel better.  Keep all follow-up visits as told by your health care provider. This is important. This information is not intended to replace advice given to you by your health care provider. Make sure you discuss any questions you have with your health care provider. Document Revised: 12/17/2017 Document Reviewed: 12/17/2017 Elsevier Patient Education  2020 [employer]  

## 2020-04-02 LAB — COMPREHENSIVE METABOLIC PANEL
AG Ratio: 1.9 (calc) (ref 1.0–2.5)
ALT: 6 U/L (ref 6–29)
AST: 12 U/L (ref 10–35)
Albumin: 4.1 g/dL (ref 3.6–5.1)
Alkaline phosphatase (APISO): 62 U/L (ref 37–153)
BUN/Creatinine Ratio: 16 (calc) (ref 6–22)
BUN: 16 mg/dL (ref 7–25)
CO2: 26 mmol/L (ref 20–32)
Calcium: 9.1 mg/dL (ref 8.6–10.4)
Chloride: 104 mmol/L (ref 98–110)
Creat: 1.02 mg/dL — ABNORMAL HIGH (ref 0.60–0.93)
Globulin: 2.2 g/dL (calc) (ref 1.9–3.7)
Glucose, Bld: 71 mg/dL (ref 65–99)
Potassium: 4.1 mmol/L (ref 3.5–5.3)
Sodium: 140 mmol/L (ref 135–146)
Total Bilirubin: 0.5 mg/dL (ref 0.2–1.2)
Total Protein: 6.3 g/dL (ref 6.1–8.1)

## 2020-04-02 LAB — CBC WITH DIFFERENTIAL/PLATELET
Absolute Monocytes: 490 cells/uL (ref 200–950)
Basophils Absolute: 61 cells/uL (ref 0–200)
Basophils Relative: 0.9 %
Eosinophils Absolute: 279 cells/uL (ref 15–500)
Eosinophils Relative: 4.1 %
HCT: 42.1 % (ref 35.0–45.0)
Hemoglobin: 14.1 g/dL (ref 11.7–15.5)
Lymphs Abs: 1979 cells/uL (ref 850–3900)
MCH: 30.9 pg (ref 27.0–33.0)
MCHC: 33.5 g/dL (ref 32.0–36.0)
MCV: 92.3 fL (ref 80.0–100.0)
MPV: 10.7 fL (ref 7.5–12.5)
Monocytes Relative: 7.2 %
Neutro Abs: 3992 cells/uL (ref 1500–7800)
Neutrophils Relative %: 58.7 %
Platelets: 232 10*3/uL (ref 140–400)
RBC: 4.56 10*6/uL (ref 3.80–5.10)
RDW: 12.6 % (ref 11.0–15.0)
Total Lymphocyte: 29.1 %
WBC: 6.8 10*3/uL (ref 3.8–10.8)

## 2020-04-02 LAB — LIPID PANEL
Cholesterol: 143 mg/dL (ref <200)
HDL: 46 mg/dL — ABNORMAL LOW (ref >=50)
LDL Cholesterol (Calc): 76 mg/dL (calc)
Non-HDL Cholesterol (Calc): 97 mg/dL (calc) (ref <130)
Total CHOL/HDL Ratio: 3.1 (calc) (ref <5.0)
Triglycerides: 127 mg/dL (ref <150)

## 2020-05-07 ENCOUNTER — Other Ambulatory Visit: Payer: Self-pay | Admitting: Internal Medicine

## 2020-05-07 NOTE — Telephone Encounter (Signed)
Rx has been sent to the pharmacy electronically. ° °

## 2020-05-17 ENCOUNTER — Other Ambulatory Visit: Payer: Self-pay

## 2020-05-17 MED ORDER — DICYCLOMINE HCL 10 MG PO CAPS: ORAL_CAPSULE | ORAL | 1 refills | Status: DC

## 2020-05-19 ENCOUNTER — Other Ambulatory Visit: Payer: Self-pay | Admitting: *Deleted

## 2020-05-19 DIAGNOSIS — E785 Hyperlipidemia, unspecified: Secondary | ICD-10-CM

## 2020-05-19 MED ORDER — EZETIMIBE 10 MG PO TABS: 10.0000 mg | ORAL_TABLET | Freq: Every day | ORAL | 1 refills | Status: DC

## 2020-05-28 ENCOUNTER — Other Ambulatory Visit: Payer: Self-pay

## 2020-05-28 ENCOUNTER — Ambulatory Visit (INDEPENDENT_AMBULATORY_CARE_PROVIDER_SITE_OTHER): Payer: Medicare HMO

## 2020-05-28 DIAGNOSIS — Z23 Encounter for immunization: Secondary | ICD-10-CM

## 2020-05-28 NOTE — Progress Notes (Signed)
Pre visit review using our clinic review tool, if applicable. No additional management support is needed unless otherwise documented below in the visit note.  Patient here for flu vaccine. 0.5mL flu vaccine given in right deltoid IM. Patient tolerated well. VIS given.   

## 2020-06-07 ENCOUNTER — Other Ambulatory Visit: Payer: Self-pay | Admitting: Family Medicine

## 2020-06-07 DIAGNOSIS — R1013 Epigastric pain: Secondary | ICD-10-CM

## 2020-06-23 ENCOUNTER — Other Ambulatory Visit: Payer: Self-pay | Admitting: Gastroenterology

## 2020-08-11 ENCOUNTER — Other Ambulatory Visit: Payer: Self-pay | Admitting: Internal Medicine

## 2020-08-11 DIAGNOSIS — E785 Hyperlipidemia, unspecified: Secondary | ICD-10-CM

## 2020-08-25 ENCOUNTER — Other Ambulatory Visit: Payer: Self-pay | Admitting: Internal Medicine

## 2020-08-25 DIAGNOSIS — E785 Hyperlipidemia, unspecified: Secondary | ICD-10-CM

## 2020-09-06 ENCOUNTER — Other Ambulatory Visit: Payer: Self-pay | Admitting: Cardiovascular Disease

## 2020-09-06 DIAGNOSIS — E785 Hyperlipidemia, unspecified: Secondary | ICD-10-CM

## 2020-09-07 ENCOUNTER — Encounter: Payer: Self-pay | Admitting: Family Medicine

## 2020-09-07 ENCOUNTER — Ambulatory Visit (INDEPENDENT_AMBULATORY_CARE_PROVIDER_SITE_OTHER): Payer: Medicare HMO | Admitting: Family Medicine

## 2020-09-07 ENCOUNTER — Other Ambulatory Visit: Payer: Self-pay

## 2020-09-07 VITALS — BP 120/70 | HR 69 | Temp 98.0°F | Resp 18 | Ht 61.5 in | Wt 113.2 lb

## 2020-09-07 DIAGNOSIS — J449 Chronic obstructive pulmonary disease, unspecified: Secondary | ICD-10-CM | POA: Diagnosis not present

## 2020-09-07 DIAGNOSIS — M25511 Pain in right shoulder: Secondary | ICD-10-CM

## 2020-09-07 DIAGNOSIS — R6889 Other general symptoms and signs: Secondary | ICD-10-CM | POA: Diagnosis not present

## 2020-09-07 LAB — TSH: TSH: 2.47 u[IU]/mL (ref 0.35–4.50)

## 2020-09-07 LAB — CBC WITH DIFFERENTIAL/PLATELET
Basophils Absolute: 0.1 10*3/uL (ref 0.0–0.1)
Basophils Relative: 1.2 % (ref 0.0–3.0)
Eosinophils Absolute: 0.3 10*3/uL (ref 0.0–0.7)
Eosinophils Relative: 3.9 % (ref 0.0–5.0)
HCT: 42.1 % (ref 36.0–46.0)
Hemoglobin: 14.1 g/dL (ref 12.0–15.0)
Lymphocytes Relative: 31 % (ref 12.0–46.0)
Lymphs Abs: 2.2 10*3/uL (ref 0.7–4.0)
MCHC: 33.6 g/dL (ref 30.0–36.0)
MCV: 93.8 fl (ref 78.0–100.0)
Monocytes Absolute: 0.5 10*3/uL (ref 0.1–1.0)
Monocytes Relative: 7.3 % (ref 3.0–12.0)
Neutro Abs: 4 10*3/uL (ref 1.4–7.7)
Neutrophils Relative %: 56.6 % (ref 43.0–77.0)
Platelets: 225 10*3/uL (ref 150.0–400.0)
RBC: 4.49 Mil/uL (ref 3.87–5.11)
RDW: 13.4 % (ref 11.5–15.5)
WBC: 7.1 10*3/uL (ref 4.0–10.5)

## 2020-09-07 LAB — COMPREHENSIVE METABOLIC PANEL
ALT: 9 U/L (ref 0–35)
AST: 12 U/L (ref 0–37)
Albumin: 4.1 g/dL (ref 3.5–5.2)
Alkaline Phosphatase: 66 U/L (ref 39–117)
BUN: 15 mg/dL (ref 6–23)
CO2: 34 mEq/L — ABNORMAL HIGH (ref 19–32)
Calcium: 9.3 mg/dL (ref 8.4–10.5)
Chloride: 101 mEq/L (ref 96–112)
Creatinine, Ser: 0.85 mg/dL (ref 0.40–1.20)
GFR: 68.52 mL/min (ref >60.00)
Glucose, Bld: 84 mg/dL (ref 70–99)
Potassium: 4.4 mEq/L (ref 3.5–5.1)
Sodium: 139 mEq/L (ref 135–145)
Total Bilirubin: 0.2 mg/dL (ref 0.2–1.2)
Total Protein: 6.3 g/dL (ref 6.0–8.3)

## 2020-09-07 MED ORDER — METHOCARBAMOL 500 MG PO TABS: 500.0000 mg | ORAL_TABLET | Freq: Four times a day (QID) | ORAL | 0 refills | Status: DC

## 2020-09-07 MED ORDER — METHYLPREDNISOLONE ACETATE 40 MG/ML IJ SUSP
40.0000 mg | Freq: Once | INTRAMUSCULAR | Status: AC
  Administered 2020-09-07: 40 mg via INTRAMUSCULAR

## 2020-09-07 NOTE — Assessment & Plan Note (Signed)
Pt requesting blood work  Hx of anemia

## 2020-09-07 NOTE — Patient Instructions (Signed)
Shoulder Pain Many things can cause shoulder pain, including:  An injury to the shoulder.  Overuse of the shoulder.  Arthritis. The source of the pain can be:  Inflammation.  An injury to the shoulder joint.  An injury to a tendon, ligament, or bone. Follow these instructions at home: Pay attention to changes in your symptoms. Let your health care provider know about them. Follow these instructions to relieve your pain. If you have a sling:  Wear the sling as told by your health care provider. Remove it only as told by your health care provider.  Loosen the sling if your fingers tingle, become numb, or turn cold and blue.  Keep the sling clean.  If the sling is not waterproof: ? Do not let it get wet. Remove it to shower or bathe.  Move your arm as little as possible, but keep your hand moving to prevent swelling. Managing pain, stiffness, and swelling  If directed, put ice on the painful area: ? Put ice in a plastic bag. ? Place a towel between your skin and the bag. ? Leave the ice on for 20 minutes, 2-3 times per day. Stop applying ice if it does not help with the pain.  Squeeze a soft ball or a foam pad as much as possible. This helps to keep the shoulder from swelling. It also helps to strengthen the arm.   General instructions  Take over-the-counter and prescription medicines only as told by your health care provider.  Keep all follow-up visits as told by your health care provider. This is important. Contact a health care provider if:  Your pain gets worse.  Your pain is not relieved with medicines.  New pain develops in your arm, hand, or fingers. Get help right away if:  Your arm, hand, or fingers: ? Tingle. ? Become numb. ? Become swollen. ? Become painful. ? Turn white or blue. Summary  Shoulder pain can be caused by an injury, overuse, or arthritis.  Pay attention to changes in your symptoms. Let your health care provider know about  them.  This condition may be treated with a sling, ice, and pain medicines.  Contact your health care provider if the pain gets worse or new pain develops. Get help right away if your arm, hand, or fingers tingle or become numb, swollen, or painful.  Keep all follow-up visits as told by your health care provider. This is important. This information is not intended to replace advice given to you by your health care provider. Make sure you discuss any questions you have with your health care provider. Document Revised: 01/29/2018 Document Reviewed: 01/29/2018 Elsevier Patient Education  2021 Elsevier Inc.  

## 2020-09-07 NOTE — Progress Notes (Signed)
Patient ID: Leah [name], female    DOB: 1945/10/07  Age: 75 y.o. MRN: [id number]    Subjective:  Subjective  HPI BOBBI YOUNT presents for c/o R shoulder pain --  X 1 week   No known injury  She thought she slept on it wrong but it is not improving ---- symptoms greater than 1 week now  She also c/o feeling cold all the time and wonders if her hgb has dropped again.    Review of Systems  Constitutional: Negative for appetite change, diaphoresis, fatigue and unexpected weight change.  Eyes: Negative for pain, redness and visual disturbance.  Respiratory: Negative for cough, chest tightness, shortness of breath and wheezing.   Cardiovascular: Negative for chest pain, palpitations and leg swelling.  Endocrine: Negative for cold intolerance, heat intolerance, polydipsia, polyphagia and polyuria.  Genitourinary: Negative for difficulty urinating, dysuria and frequency.  Neurological: Negative for dizziness, light-headedness, numbness and headaches.    History Past Medical History:  Diagnosis Date  . Allergy   . Anemia   . Anxiety   . Barrett esophagus   . Bladder pain   . Chronic bronchitis ([place])    per pt last episode july 2016  . Complication of anesthesia    slow to wake  . COPD (chronic obstructive pulmonary disease) ([place])   . Depression   . Diverticulosis   . Frequency of urination   . GERD (gastroesophageal reflux disease)   . Hiatal hernia   . History of adenomatous polyp of colon    tubular adenoma  . History of chronic gastritis   . History of diverticulitis of colon   . History of esophageal dilatation    for stricture  . History of esophageal spasm   . Hyperlipidemia   . Hypertension   . IBS (irritable bowel syndrome)   . Interstitial cystitis   . PAD (peripheral artery disease) ([place])   . Productive cough    intermittant  . Smokers' cough ([place])   . Urgency of urination     She has a past surgical history that includes Carpal tunnel release  (Right, 1993); Sinus surgery with [employer] (1991); laparoscopic appendectomy (N/A, 09/21/2013); Anterior cervical decomp/discectomy fusion (N/A, 09/07/2014); Tonsillectomy and adenoidectomy (1964); Laparoscopic cholecystectomy (2002); Abdominal hysterectomy (1975); Temporomandibular joint surgery (1990); LAPAROSCOPY SIGMOID COLECTOMY (10-05-2008); Microlaryngoscopy (09-27-2006); Colonoscopy (last one 04-30-2013); Esophagogastroduodenoscopy (last one 09-11-2012); CYSTO/  HOD/  BLADDER BX (1990's); cysto with hydrodistension (N/A, 06/08/2015); Lower Extremity Angiography (Left, 10/14/2018); PERIPHERAL VASCULAR INTERVENTION (10/14/2018); ABDOMINAL AORTOGRAM (N/A, 10/14/2018); Lower Extremity Angiography (Bilateral, 10/14/2018); and Colonoscopy with propofol (N/A, 09/09/2019).   Her family history includes Colon polyps in her mother; Diabetes in her father; Heart disease in her father; Hypertension in her father; Leukemia in her father; Pancreatic cancer in her father and mother; Prostate cancer in her father.She reports that she has been smoking cigarettes. She has a 35.25 pack-year smoking history. She has never used smokeless tobacco. She reports that she does not drink alcohol and does not use drugs.  Current Outpatient Medications on File Prior to Visit  Medication Sig Dispense Refill  . aspirin EC 81 MG EC tablet Take 1 tablet (81 mg total) by mouth daily.    Marland Kitchen atorvastatin (LIPITOR) 40 MG tablet TAKE 1 TABLET EVERY DAY 30 tablet 2  . clopidogrel (PLAVIX) 75 MG tablet TAKE 1 TABLET BY MOUTH EVERY DAY 90 tablet 1  . dicyclomine (BENTYL) 10 MG capsule TAKE 1 CAPSULE BY MOUTH AS NEEDED FOR UP TO 1 DOSE  FOR SPASMS. 90 capsule 0  . estradiol (CLIMARA - DOSED IN MG/24 HR) 0.1 mg/24hr patch Place 0.1 mg onto the skin every Monday.  0  . ezetimibe (ZETIA) 10 MG tablet Take 1 tablet (10 mg total) by mouth daily. 90 tablet 1  . famotidine (PEPCID) 20 MG tablet Take 1 tablet (20 mg total) by mouth 2 (two) times daily.  180 tablet 3  . lisinopril (ZESTRIL) 20 MG tablet Take 1 tablet (20 mg total) by mouth daily. 90 tablet 1  . LORazepam (ATIVAN) 0.5 MG tablet Take 0.5 mg by mouth at bedtime.   5  . polyethylene glycol (MIRALAX) 17 g packet Take 17 g by mouth 2 (two) times daily. 30 each 0  . REPATHA SURECLICK 500 MG/ML SOAJ INJECT 1 DOSE INTO THE SKIN EVERY 14 (FOURTEEN) DAYS. (Patient taking differently: Inject 140 mg into the skin every 14 (fourteen) days. 1 dose (140mg ) every 14 days) 2 pen 11  . sertraline (ZOLOFT) 50 MG tablet Take 25 mg by mouth at bedtime.      Current Facility-Administered Medications on File Prior to Visit  Medication Dose Route Frequency Provider Last Rate Last Admin  . triamcinolone acetonide (KENALOG-40) injection (RADIOLOGY ONLY) 60 mg  60 mg Epidural Once Bergman, Meghan D, NP         Objective:  Objective  Physical Exam Vitals and nursing note reviewed.  Constitutional:      Appearance: She is well-developed and well-nourished.  HENT:     Head: Normocephalic and atraumatic.  Eyes:     Extraocular Movements: EOM normal.     Conjunctiva/sclera: Conjunctivae normal.  Neck:     Thyroid: No thyromegaly.     Vascular: No carotid bruit or JVD.  Cardiovascular:     Rate and Rhythm: Normal rate and regular rhythm.     Heart sounds: Normal heart sounds. No murmur heard.   Pulmonary:     Effort: Pulmonary effort is normal. No respiratory distress.     Breath sounds: Normal breath sounds. No wheezing or rales.  Chest:     Chest wall: No tenderness.  Musculoskeletal:        General: Tenderness present. No swelling, deformity, signs of injury or edema. Normal range of motion.       Arms:     Cervical back: Normal range of motion and neck supple.     Right lower leg: No edema.     Left lower leg: No edema.  Neurological:     Mental Status: She is alert and oriented to person, place, and time.  Psychiatric:        Mood and Affect: Mood and affect normal.    BP 120/70  (BP Location: Left Arm, Patient Position: Sitting, Cuff Size: Normal)   Pulse 69   Temp 98 F (36.7 C) (Oral)   Resp 18   Ht 5' 1.5" (1.562 m)   Wt 113 lb 3.2 oz (51.3 kg)   SpO2 95%   BMI 21.04 kg/m  Wt Readings from Last 3 Encounters:  09/07/20 113 lb 3.2 oz (51.3 kg)  04/01/20 105 lb 12.8 oz (48 kg)  10/17/19 109 lb 9.6 oz (49.7 kg)     Lab Results  Component Value Date   WBC 6.8 04/01/2020   HGB 14.1 04/01/2020   HCT 42.1 04/01/2020   PLT 232 04/01/2020   GLUCOSE 71 04/01/2020   CHOL 143 04/01/2020   TRIG 127 04/01/2020   HDL 46 (L) 04/01/2020   LDLDIRECT  151.0 11/07/2016   LDLCALC 76 04/01/2020   ALT 6 04/01/2020   AST 12 04/01/2020   NA 140 04/01/2020   K 4.1 04/01/2020   CL 104 04/01/2020   CREATININE 1.02 (H) 04/01/2020   BUN 16 04/01/2020   CO2 26 04/01/2020   TSH 3.976 09/05/2019   INR 1.0 05/21/2009   HGBA1C 6.1 12/18/2017   MICROALBUR <0.7 11/09/2014    VAS [place] ABI WITH/WO TBI  Result Date: 03/05/2020 LOWER EXTREMITY DOPPLER STUDY Indications: Peripheral artery disease, s/p left common iliac stenting. Patient              says her legs are feeling "good", no complaints today. High Risk Factors: Hypertension, hyperlipidemia, current smoker, coronary artery                    disease.  Vascular Interventions: S/P PTA and covered stenting of left common iliac artery                         CTO using VBX stents on 10/14/18. Comparison Study: Previous ABIs performed 03/04/19 were 1.04 on the right and 1.11                   on the left. Performing Technologist: Mariane Masters RVT  Examination Guidelines: A complete evaluation includes at minimum, Doppler waveform signals and systolic blood pressure reading at the level of bilateral brachial, anterior tibial, and posterior tibial arteries, when vessel segments are accessible. Bilateral testing is considered an integral part of a complete examination. Photoelectric Plethysmograph (PPG) waveforms and toe systolic  pressure readings are included as required and additional duplex testing as needed. Limited examinations for reoccurring indications may be performed as noted.  ABI Findings: +---------+------------------+-----+---------+--------+ Right    Rt Pressure (mmHg)IndexWaveform Comment  +---------+------------------+-----+---------+--------+ Brachial 151                                      +---------+------------------+-----+---------+--------+ ATA      171               1.12 triphasic         +---------+------------------+-----+---------+--------+ PTA      171               1.12 biphasic          +---------+------------------+-----+---------+--------+ PERO     176               1.16 triphasic         +---------+------------------+-----+---------+--------+ Great Toe76                0.50 Abnormal          +---------+------------------+-----+---------+--------+ +---------+------------------+-----+---------+-------+ Left     Lt Pressure (mmHg)IndexWaveform Comment +---------+------------------+-----+---------+-------+ Brachial 152                                     +---------+------------------+-----+---------+-------+ ATA      169               1.11 biphasic         +---------+------------------+-----+---------+-------+ PTA      163               1.07 triphasic        +---------+------------------+-----+---------+-------+ PERO     164  1.08 biphasic         +---------+------------------+-----+---------+-------+ Great Toe75                0.49 Abnormal         +---------+------------------+-----+---------+-------+ +-------+-----------+-----------+------------+------------+ ABI/TBIToday's ABIToday's TBIPrevious ABIPrevious TBI +-------+-----------+-----------+------------+------------+ Right  1.16       0.50       1.04        0.46         +-------+-----------+-----------+------------+------------+ Left   1.11       0.49        1.11        0.62         +-------+-----------+-----------+------------+------------+ Bilateral ABIs and TBIs appear essentially unchanged compared to prior study on 03/04/19.  Summary: Right: Resting right ankle-brachial index is within normal range. No evidence of significant right lower extremity arterial disease. The right toe-brachial index is abnormal. Left: Resting left ankle-brachial index is within normal range. No evidence of significant left lower extremity arterial disease. The left toe-brachial index is abnormal.  *See table(s) above for measurements and observations. See aorta-iliac report.  Suggest follow up study in 12 months. Electronically signed by Carlyle Dolly MD on 03/05/2020 at 12:26:20 PM.    Final    VAS US AORTA/IVC/ILIACS  Result Date: 03/05/2020 ABDOMINAL AORTA STUDY Indications: Follow-up left common iliac stenting. Patient says her legs are              feeling "good", no complaints today. Risk         Hypertension, hyperlipidemia, current smoker, coronary artery Factors:     disease. Other Factors: Today's ABIs are 1.16 on the right and 1.11 on the left. Vascular Interventions: S/P PTA and covered stenting of the left common iliac                         artery CTO using VBX stents on 10/14/18.  Comparison Study: Previous aorta-iliac duplex performed 11/04/18 showed PSV 338                   cm/sec in the right common iliac artery. Duplex 03/04/19 showed                   widely patent left common iliac artery, s/p stent with PSV of                   141 cm/sec. Performing Technologist: Mariane Masters RVT  Examination Guidelines: A complete evaluation includes B-mode imaging, spectral Doppler, color Doppler, and power Doppler as needed of all accessible portions of each vessel. Bilateral testing is considered an integral part of a complete examination. Limited examinations for reoccurring indications may be performed as noted.  Abdominal Aorta Findings:  +-------------+-------+----------+----------+--------+--------+-------------+ Location     AP (cm)Trans (cm)PSV (cm/s)WaveformThrombusComments      +-------------+-------+----------+----------+--------+--------+-------------+ Proximal     2.10   1.90      56        biphasic                      +-------------+-------+----------+----------+--------+--------+-------------+ Mid          1.50   1.70      77        biphasic                      +-------------+-------+----------+----------+--------+--------+-------------+ Distal       2.00   1.90  82        biphasic        ectatic       +-------------+-------+----------+----------+--------+--------+-------------+ RT CIA Prox  0.9    1.0       207       biphasic                      +-------------+-------+----------+----------+--------+--------+-------------+ RT CIA Mid                    332       biphasic        >50% stenosis +-------------+-------+----------+----------+--------+--------+-------------+ RT CIA Distal                 138       biphasic                      +-------------+-------+----------+----------+--------+--------+-------------+ RT EIA Prox                   131       biphasic                      +-------------+-------+----------+----------+--------+--------+-------------+ RT EIA Mid                    115       biphasic                      +-------------+-------+----------+----------+--------+--------+-------------+ RT EIA Distal                 122       biphasic                      +-------------+-------+----------+----------+--------+--------+-------------+ LT EIA Prox                   148       biphasic                      +-------------+-------+----------+----------+--------+--------+-------------+ LT EIA Mid                    164       biphasic                      +-------------+-------+----------+----------+--------+--------+-------------+  LT EIA Distal                 119       biphasic                      +-------------+-------+----------+----------+--------+--------+-------------+ IVC/Iliac Findings: +--------+------+--------+--------+   IVC   PatentThrombusComments +--------+------+--------+--------+ IVC Proxpatent                 +--------+------+--------+--------+    Left Stent(s): +-----------------+--------+--------+--------+------------+ Left common iliacPSV cm/sStenosisWaveformComments     +-----------------+--------+--------+--------+------------+ Prox to Stent    101             biphasicdistal aorta +-----------------+--------+--------+--------+------------+ Proximal Stent   190             biphasic             +-----------------+--------+--------+--------+------------+ Mid Stent        142             biphasic             +-----------------+--------+--------+--------+------------+ Distal Stent  160             biphasic             +-----------------+--------+--------+--------+------------+ Distal to Stent  138             biphasicbowel gas    +-----------------+--------+--------+--------+------------+    Summary: Abdominal Aorta: The largest aortic measurement is 2.0 cm. Stenosis: +--------------------+-------------+-----------+------------------+ Location            Stenosis     Stent      Comments           +--------------------+-------------+-----------+------------------+ Right Common Iliac  >50% stenosis           essentially stable +--------------------+-------------+-----------+------------------+ Left Common Iliac                no stenosis                   +--------------------+-------------+-----------+------------------+ Right External Iliac<50% stenosis                              +--------------------+-------------+-----------+------------------+ Left External Iliac <50% stenosis                               +--------------------+-------------+-----------+------------------+ The left common iliac artery is patent, s/p stenting. IVC/Iliac: There is no evidence of thrombus involving the IVC.  *See table(s) above for measurements and observations. Suggest follow up study in 12 months.  Electronically signed by Carlyle Dolly MD on 03/05/2020 at 12:57:59 PM.    Final      Assessment & Plan:  Plan  I have discontinued [name]. Yazdani's doxycycline. I am also having her start on methocarbamol. Additionally, I am having her maintain her sertraline, LORazepam, estradiol, aspirin, Repatha SureClick, polyethylene glycol, lisinopril, clopidogrel, ezetimibe, famotidine, dicyclomine, and atorvastatin. We administered methylPREDNISolone acetate.  Meds ordered this encounter  Medications  . methocarbamol (ROBAXIN) 500 MG tablet    Sig: Take 1 tablet (500 mg total) by mouth 4 (four) times daily.    Dispense:  45 tablet    Refill:  0  . methylPREDNISolone acetate (DEPO-MEDROL) injection 40 mg    Problem List Items Addressed This Visit      Unprioritized   Acute pain of right shoulder - Primary    Muscle relaxer prn  Tylenol prn Depo medrol 40 mg im If no improvement --- consider sport med/ ortho      Relevant Medications   methocarbamol (ROBAXIN) 500 MG tablet   Cold intolerance    Pt requesting blood work  Hx of anemia      Relevant Orders   TSH   CBC with Differential/Platelet   Comprehensive metabolic panel      Follow-up: Return if symptoms worsen or fail to improve.  [name], DO

## 2020-09-07 NOTE — Assessment & Plan Note (Signed)
Muscle relaxer prn  Tylenol prn Depo medrol 40 mg im If no improvement --- consider sport med/ ortho

## 2020-09-09 ENCOUNTER — Other Ambulatory Visit: Payer: Self-pay | Admitting: Family Medicine

## 2020-09-09 DIAGNOSIS — I10 Essential (primary) hypertension: Secondary | ICD-10-CM

## 2020-09-11 ENCOUNTER — Other Ambulatory Visit: Payer: Self-pay | Admitting: Internal Medicine

## 2020-09-14 DIAGNOSIS — Z1231 Encounter for screening mammogram for malignant neoplasm of breast: Secondary | ICD-10-CM | POA: Diagnosis not present

## 2020-09-14 LAB — HM MAMMOGRAPHY

## 2020-09-16 DIAGNOSIS — F325 Major depressive disorder, single episode, in full remission: Secondary | ICD-10-CM | POA: Diagnosis not present

## 2020-09-16 DIAGNOSIS — F064 Anxiety disorder due to known physiological condition: Secondary | ICD-10-CM | POA: Diagnosis not present

## 2020-09-28 DIAGNOSIS — I10 Essential (primary) hypertension: Secondary | ICD-10-CM | POA: Diagnosis not present

## 2020-09-28 DIAGNOSIS — M5412 Radiculopathy, cervical region: Secondary | ICD-10-CM | POA: Diagnosis not present

## 2020-10-06 ENCOUNTER — Other Ambulatory Visit: Payer: Self-pay | Admitting: Student

## 2020-10-06 ENCOUNTER — Telehealth: Payer: Self-pay | Admitting: *Deleted

## 2020-10-06 ENCOUNTER — Other Ambulatory Visit: Payer: Self-pay | Admitting: Internal Medicine

## 2020-10-06 DIAGNOSIS — M5412 Radiculopathy, cervical region: Secondary | ICD-10-CM

## 2020-10-06 NOTE — Telephone Encounter (Signed)
   [facility] Pre-operative Risk Assessment    HEARTCARE STAFF: - Please ensure there is not already an duplicate clearance open for this procedure. - Under Visit Info/Reason for Call, type in Other and utilize the format Clearance MM/DD/YY or Clearance TBD. Do not use dashes or single digits. - If request is for dental extraction, please clarify the # of teeth to be extracted.  Request for surgical clearance:  1. What type of surgery is being performed? Cervical epidural   2. When is this surgery scheduled? TBD   3. What type of clearance is required (medical clearance vs. Pharmacy clearance to hold med vs. Both)? both  4. Are there any medications that need to be held prior to surgery and how long?plavix for 5 days   5. Practice name and name of physician performing surgery? [facility]   6. What is the office phone number? [phone]   7.   What is the office fax number? [phone]  8.   Anesthesia type (None, local, MAC, general) ? local   Fredia Beets 10/06/2020, 5:14 PM  _________________________________________________________________   (provider comments below)

## 2020-10-07 NOTE — Telephone Encounter (Signed)
Primary Cardiologist:Jonathan Gwenlyn Found, MD  Chart reviewed as part of pre-operative protocol coverage. Because of Leah [name]'s past medical history and time since last visit, he/she will require a follow-up visit in order to better assess preoperative cardiovascular risk.  Pre-op covering staff: - Please schedule appointment and call patient to inform them. - Please contact requesting surgeon's office via preferred method (i.e, phone, fax) to inform them of need for appointment prior to surgery.  If applicable, this message will also be routed to pharmacy pool and/or primary cardiologist for input on holding anticoagulant/antiplatelet agent as requested below so that this information is available at time of patient's appointment.   Deberah Pelton, NP  10/07/2020, 10:42 AM

## 2020-10-07 NOTE — Telephone Encounter (Signed)
Patient is scheduled to see Dr. Gwenlyn Found on 10/29/20 at 10:15 AM will update office note to include pre-op clearance

## 2020-10-11 ENCOUNTER — Telehealth: Payer: Self-pay | Admitting: Cardiovascular Disease

## 2020-10-11 NOTE — Telephone Encounter (Signed)
Patient requesting to speak with [place]. Did not want me to take a message.

## 2020-10-12 NOTE — Telephone Encounter (Signed)
Called pt. Left message for pt to call back.

## 2020-10-12 NOTE — Telephone Encounter (Signed)
Called and spoke with the patient. She stated that she needed clearance for a a cervical epidural at [facility]. She was told that she needed to schedule a follow up first and the earliest she could get in was 4/1 with Dr. Gwenlyn Found. She stated that she needs the epidural as soon as possible due to pain and would like to know if she can get an earlier appointment or clearance.

## 2020-10-12 NOTE — Telephone Encounter (Signed)
Patient return call that was left by [place]

## 2020-10-17 DIAGNOSIS — N3000 Acute cystitis without hematuria: Secondary | ICD-10-CM | POA: Diagnosis not present

## 2020-10-17 DIAGNOSIS — R300 Dysuria: Secondary | ICD-10-CM | POA: Diagnosis not present

## 2020-10-20 ENCOUNTER — Other Ambulatory Visit: Payer: Self-pay | Admitting: Internal Medicine

## 2020-10-25 DIAGNOSIS — N3010 Interstitial cystitis (chronic) without hematuria: Secondary | ICD-10-CM | POA: Diagnosis not present

## 2020-10-29 ENCOUNTER — Other Ambulatory Visit: Payer: Self-pay

## 2020-10-29 ENCOUNTER — Encounter: Payer: Self-pay | Admitting: Cardiovascular Disease

## 2020-10-29 ENCOUNTER — Ambulatory Visit: Payer: Medicare HMO | Admitting: Cardiovascular Disease

## 2020-10-29 ENCOUNTER — Other Ambulatory Visit: Payer: Self-pay | Admitting: Cardiovascular Disease

## 2020-10-29 VITALS — BP 118/70 | HR 64 | Ht 61.5 in | Wt 115.0 lb

## 2020-10-29 DIAGNOSIS — E785 Hyperlipidemia, unspecified: Secondary | ICD-10-CM

## 2020-10-29 DIAGNOSIS — I10 Essential (primary) hypertension: Secondary | ICD-10-CM

## 2020-10-29 DIAGNOSIS — I739 Peripheral vascular disease, unspecified: Secondary | ICD-10-CM

## 2020-10-29 NOTE — Assessment & Plan Note (Signed)
History of PAD status post left common iliac artery CTO intervention by myself 10/14/2018 with VBX covered stents.  Her claudication after that resolved and her most recent Doppler studies performed 03/03/2020 revealed normal ABIs bilaterally with a patent iliac stent.  We will repeat this on annual basis.

## 2020-10-29 NOTE — Patient Instructions (Signed)
Medication Instructions:  Your physician recommends that you continue on your current medications as directed. Please refer to the Current Medication list given to you today.  *If you need a refill on your cardiac medications before your next appointment, please call your pharmacy*   Testing/Procedures: Your physician has requested that you have an abdominal aorta duplex. During this test, an ultrasound is used to evaluate the aorta. Allow 30 minutes for this exam. Do not eat after midnight the day before and avoid carbonated beverages.  Your physician has requested that you have an ankle brachial index (ABI). During this test an ultrasound and blood pressure cuff are used to evaluate the arteries that supply the arms and legs with blood. Allow thirty minutes for this exam. There are no restrictions or special instructions.  These procedures are done at [place]. 2nd Floor. To be done in Aug 2022.   Follow-Up: At [facility], you and your health needs are our priority.  As part of our continuing mission to provide you with exceptional heart care, we have created designated Provider Care Teams.  These Care Teams include your primary Cardiologist (physician) and Advanced Practice Providers (APPs -  Physician Assistants and Nurse Practitioners) who all work together to provide you with the care you need, when you need it.  We recommend signing up for the patient portal called "MyChart".  Sign up information is provided on this After Visit Summary.  MyChart is used to connect with patients for Virtual Visits (Telemedicine).  Patients are able to view lab/test results, encounter notes, upcoming appointments, etc.  Non-urgent messages can be sent to your provider as well.   To learn more about what you can do with MyChart, go to [email or web address].    Your next appointment:   No future appointments made. We will see you on an as needed basis. If you need to schedule an appointment  please call our office.   Provider:   Quay Burow, MD

## 2020-10-29 NOTE — Progress Notes (Signed)
10/29/2020 Leah [name]   February 09, 1946  [id number]  Primary Physician [name], DO Primary Cardiologist: Lorretta Harp MD Lupe Carney, [place]  HPI:  Leah [name] is a 75 y.o.  thin appearing married Caucasian female, mother 76, grandmother of 5 grandchildren referred by Dr. Debara Pickett for peripheral vascular valuation because of left lower extremity claudication.   I last saw her in the office 09/02/2019.  Her risk factors include 50 pack years of tobacco abuse smoking 1 pack/day, treated hypertension and severe hyperlipidemia. She does have a [name] family history for heart disease with a father who had CABG at age 19 and brother who is had stents. She is never had a heart attack or stroke. She denies chest pain or shortness of breath. She does have a daughter age 78 who has had what sounds like an aortobifemoral bypass graft. She is complained of left hip claudication for several years and has been seen by an orthopedic surgeon. She is been getting hip injections for this pain and was told that she had "bursitis".  I performed angiography on her 10/14/2018 revealing an occluded left common iliac artery.  She underwent PTA and covered stenting using overlapping VBX stents (7 x 59, 7 x 29) is resulted in resolution of her claudication symptoms and normalization of her Dopplers.  Her most recent Doppler study performed 03/04/2019 revealed a widely patent stent.   Since I saw her a year ago she continues to do well.  She does continue to smoke a half a pack a day however.  She denies chest pain, shortness of breath or claudication.  Her last Doppler studies performed 03/03/2020 revealed normal ABIs bilaterally with a widely patent iliac stent.   Current Meds  Medication Sig  . aspirin EC 81 MG EC tablet Take 1 tablet (81 mg total) by mouth daily.  Marland Kitchen atorvastatin (LIPITOR) 40 MG tablet TAKE 1 TABLET EVERY DAY  . clopidogrel (PLAVIX) 75 MG tablet TAKE 1 TABLET BY MOUTH  EVERY DAY  . dicyclomine (BENTYL) 10 MG capsule TAKE 1 CAPSULE BY MOUTH AS NEEDED FOR UP TO 1 DOSE FOR SPASMS.  Marland Kitchen estradiol (CLIMARA - DOSED IN MG/24 HR) 0.1 mg/24hr patch Place 0.1 mg onto the skin every Monday.  . ezetimibe (ZETIA) 10 MG tablet Take 1 tablet (10 mg total) by mouth daily.  . famotidine (PEPCID) 20 MG tablet Take 1 tablet (20 mg total) by mouth 2 (two) times daily.  Marland Kitchen lisinopril (ZESTRIL) 20 MG tablet TAKE 1 TABLET EVERY DAY  . LORazepam (ATIVAN) 0.5 MG tablet Take 0.5 mg by mouth at bedtime.   . methocarbamol (ROBAXIN) 500 MG tablet Take 1 tablet (500 mg total) by mouth 4 (four) times daily.  . polyethylene glycol (MIRALAX) 17 g packet Take 17 g by mouth 2 (two) times daily.  Marland Kitchen REPATHA SURECLICK 824 MG/ML SOAJ INJECT 1 DOSE INTO THE SKIN EVERY 14 (FOURTEEN) DAYS.  Marland Kitchen sertraline (ZOLOFT) 50 MG tablet Take 25 mg by mouth at bedtime.      Allergies  Allergen Reactions  . Amoxicillin-Pot Clavulanate Shortness Of Breath, Itching and Swelling  . Avelox [Moxifloxacin Hcl In Nacl] Swelling    Swelling, itching and shortness of breath  . Ciprofloxacin Shortness Of Breath, Itching and Swelling  . Keflex [Cephalexin] Shortness Of Breath and Swelling  . Penicillins Shortness Of Breath, Itching and Swelling    Did it involve swelling of the face/tongue/throat, SOB, or low BP? Yes Did it  involve sudden or severe rash/hives, skin peeling, or any reaction on the inside of your mouth or nose? No Did you need to seek medical attention at a hospital or doctor's office? Yes When did it last happen?Several years If all above answers are "NO", may proceed with cephalosporin use.   . Sulfa Antibiotics Shortness Of Breath, Itching and Swelling  . Prednisone Other (See Comments)    GI irritation; is able to tolerate injections  . Acyclovir And Related Other (See Comments)    Pt does not recall this reaction  . Clindamycin/Lincomycin Itching and Swelling  . Levaquin [Levofloxacin] Hives   . Zyvox [Linezolid] Diarrhea    Social History   Socioeconomic History  . Marital status: Married    Spouse name: Not on file  . Number of children: Not on file  . Years of education: Not on file  . Highest education level: Not on file  Occupational History  . Not on file  Tobacco Use  . Smoking status: Current Every Day Smoker    Packs/day: 0.75    Years: 47.00    Pack years: 35.25    Types: Cigarettes  . Smokeless tobacco: Never Used  . Tobacco comment: counseled about smoking cessation  Vaping Use  . Vaping Use: Never used  Substance and Sexual Activity  . Alcohol use: No    Alcohol/week: 0.0 standard drinks  . Drug use: No  . Sexual activity: Not Currently    Birth control/protection: Post-menopausal  Other Topics Concern  . Not on file  Social History Narrative  . Not on file   Social Determinants of Health   Financial Resource Strain: Not on file  Food Insecurity: Not on file  Transportation Needs: Not on file  Physical Activity: Not on file  Stress: Not on file  Social Connections: Not on file  Intimate Partner Violence: Not on file     Review of Systems: General: negative for chills, fever, night sweats or weight changes.  Cardiovascular: negative for chest pain, dyspnea on exertion, edema, orthopnea, palpitations, paroxysmal nocturnal dyspnea or shortness of breath Dermatological: negative for rash Respiratory: negative for cough or wheezing Urologic: negative for hematuria Abdominal: negative for nausea, vomiting, diarrhea, bright red blood per rectum, melena, or hematemesis Neurologic: negative for visual changes, syncope, or dizziness All other systems reviewed and are otherwise negative except as noted above.    Blood pressure 118/70, pulse 64, height 5' 1.5" (1.562 m), weight 115 lb (52.2 kg), SpO2 96 %.  General appearance: alert and no distress Neck: no adenopathy, no carotid bruit, no JVD, supple, symmetrical, trachea midline and thyroid  not enlarged, symmetric, no tenderness/mass/nodules Lungs: clear to auscultation bilaterally Heart: regular rate and rhythm, S1, S2 normal, no murmur, click, rub or gallop Extremities: extremities normal, atraumatic, no cyanosis or edema Pulses: 2+ and symmetric Skin: Skin color, texture, turgor normal. No rashes or lesions Neurologic: Alert and oriented X 3, normal strength and tone. Normal symmetric reflexes. Normal coordination and gait  EKG sinus rhythm at 64 without ST or T wave changes.  Personally reviewed this EKG.  ASSESSMENT AND PLAN:   PAD (peripheral artery disease) (HCC) History of PAD status post left common iliac artery CTO intervention by myself 10/14/2018 with VBX covered stents.  Her claudication after that resolved and her most recent Doppler studies performed 03/03/2020 revealed normal ABIs bilaterally with a patent iliac stent.  We will repeat this on annual basis.      Lorretta Harp MD FACP,FACC,FAHA, [facility]  10/29/2020 10:54 AM

## 2020-11-01 NOTE — Telephone Encounter (Signed)
   Patient Name: Leah [name]  DOB: Apr 12, 1946  MRN: [id number]   Primary Cardiologist: Quay Burow, MD  Chart reviewed as part of pre-operative protocol coverage. Patient was recently seen by Dr. Gwenlyn Found on 10/29/2020. Following this visit, Dr. Kennon Holter RN sent pre-op pool a message that stated: "Patient has been cleared by Dr. Gwenlyn Found to have spinal injection.  imaging is doing the procedure- they should be sending the clearance. Per Dr. Gwenlyn Found patient should come off Plavix 5 days prior to injection and then resume when procedural Dr. Roney Jaffe she can."  I will route this recommendation to the requesting party via [place] fax function and remove from pre-op pool.  Please call with questions.  Darreld Mclean, PA-C 11/01/2020, 8:35 AM

## 2020-11-16 ENCOUNTER — Ambulatory Visit
Admission: RE | Admit: 2020-11-16 | Discharge: 2020-11-16 | Disposition: A | Discharge status: Home / Self Care | Payer: Medicare HMO | Source: Ambulatory Visit | Attending: Student | Admitting: Student

## 2020-11-16 DIAGNOSIS — M47812 Spondylosis without myelopathy or radiculopathy, cervical region: Secondary | ICD-10-CM | POA: Diagnosis not present

## 2020-11-16 DIAGNOSIS — M5412 Radiculopathy, cervical region: Secondary | ICD-10-CM

## 2020-11-16 MED ORDER — TRIAMCINOLONE ACETONIDE 40 MG/ML IJ SUSP (RADIOLOGY)
60.0000 mg | Freq: Once | INTRAMUSCULAR | Status: AC
  Administered 2020-11-16: 60 mg via EPIDURAL

## 2020-11-16 MED ORDER — IOPAMIDOL (ISOVUE-M 300) INJECTION 61%
1.0000 mL | Freq: Once | INTRAMUSCULAR | Status: AC | PRN
  Administered 2020-11-16: 1 mL via EPIDURAL

## 2020-11-16 NOTE — Discharge Instructions (Signed)

## 2020-12-04 ENCOUNTER — Other Ambulatory Visit: Payer: Self-pay | Admitting: Gastroenterology

## 2020-12-07 ENCOUNTER — Other Ambulatory Visit: Payer: Self-pay

## 2020-12-07 ENCOUNTER — Ambulatory Visit (INDEPENDENT_AMBULATORY_CARE_PROVIDER_SITE_OTHER): Payer: Medicare HMO | Admitting: Medical

## 2020-12-07 VITALS — BP 114/66 | HR 80 | Temp 98.9°F | Resp 18 | Ht 61.0 in | Wt 108.0 lb

## 2020-12-07 DIAGNOSIS — R0981 Nasal congestion: Secondary | ICD-10-CM | POA: Diagnosis not present

## 2020-12-07 DIAGNOSIS — R059 Cough, unspecified: Secondary | ICD-10-CM | POA: Diagnosis not present

## 2020-12-07 DIAGNOSIS — J40 Bronchitis, not specified as acute or chronic: Secondary | ICD-10-CM | POA: Diagnosis not present

## 2020-12-07 MED ORDER — DOXYCYCLINE HYCLATE 100 MG PO TABS: 100.0000 mg | ORAL_TABLET | Freq: Two times a day (BID) | ORAL | 0 refills | Status: DC

## 2020-12-07 MED ORDER — SUCRALFATE 1 G PO TABS: ORAL_TABLET | ORAL | 0 refills | Status: DC

## 2020-12-07 NOTE — Progress Notes (Signed)
Subjective:    Patient ID: Leah [name], female    DOB: July 03, 1946, 75 y.o.   MRN: [id number]  HPI  Pt in for evaluation.  Pt states she got sinus pressure, nasal congestion and occasional sneezing. She is coughing up some mucus. No fever, no chills or sweats. No wheezing. Pt can tolerate doxycycline.   2 days ago had negative covid test. Pt never had covid and has never been vaccinated.  Recent reflux she belching a lot and feels acid at time up to suprasternal notch area. Pt has hx of hiatal hernia. Pt is on fomatadine.  Hx of htn. BP well controlled. Pt is on lisinopril.  Hx of smoking about 1/2 pack a day.     Review of Systems  Constitutional: Negative for chills, fatigue and fever.  HENT: Positive for congestion. Negative for ear discharge and facial swelling.   Respiratory: Positive for cough. Negative for chest tightness, shortness of breath, wheezing and stridor.   Cardiovascular: Negative for chest pain and palpitations.  Gastrointestinal: Negative for abdominal pain and blood in stool.  Musculoskeletal: Negative for back pain and joint swelling.  Skin: Negative for rash.  Neurological: Negative for dizziness, light-headedness and headaches.  Hematological: Negative for adenopathy. Does not bruise/bleed easily.  Psychiatric/Behavioral: Negative for behavioral problems and confusion.    Past Medical History:  Diagnosis Date  . Allergy   . Anemia   . Anxiety   . Barrett esophagus   . Bladder pain   . Chronic bronchitis ([place])    per pt last episode july 2016  . Complication of anesthesia    slow to wake  . COPD (chronic obstructive pulmonary disease) ([place])   . Depression   . Diverticulosis   . Frequency of urination   . GERD (gastroesophageal reflux disease)   . Hiatal hernia   . History of adenomatous polyp of colon    tubular adenoma  . History of chronic gastritis   . History of diverticulitis of colon   . History of esophageal dilatation     for stricture  . History of esophageal spasm   . Hyperlipidemia   . Hypertension   . IBS (irritable bowel syndrome)   . Interstitial cystitis   . PAD (peripheral artery disease) ([place])   . Productive cough    intermittant  . Smokers' cough ([place])   . Urgency of urination      Social History   Socioeconomic History  . Marital status: Married    Spouse name: Not on file  . Number of children: Not on file  . Years of education: Not on file  . Highest education level: Not on file  Occupational History  . Not on file  Tobacco Use  . Smoking status: Current Every Day Smoker    Packs/day: 0.75    Years: 47.00    Pack years: 35.25    Types: Cigarettes  . Smokeless tobacco: Never Used  . Tobacco comment: counseled about smoking cessation  Vaping Use  . Vaping Use: Never used  Substance and Sexual Activity  . Alcohol use: No    Alcohol/week: 0.0 standard drinks  . Drug use: No  . Sexual activity: Not Currently    Birth control/protection: Post-menopausal  Other Topics Concern  . Not on file  Social History Narrative  . Not on file   Social Determinants of Health   Financial Resource Strain: Not on file  Food Insecurity: Not on file  Transportation Needs: Not on file  Physical Activity: Not on file  Stress: Not on file  Social Connections: Not on file  Intimate Partner Violence: Not on file    Past Surgical History:  Procedure Laterality Date  . ABDOMINAL AORTOGRAM N/A 10/14/2018   Procedure: ABDOMINAL AORTOGRAM;  Surgeon: Lorretta Harp, MD;  Location: [facility];  Service: Cardiovascular;  Laterality: N/A;  . ABDOMINAL HYSTERECTOMY  1975  . ANTERIOR CERVICAL DECOMP/DISCECTOMY FUSION N/A 09/07/2014   Procedure: Exploration of Fusion and Removal of Anterior Cervical Hematoma;  Surgeon: Newman Pies, MD;  Location: [facility];  Service: Neurosurgery;  Laterality: N/A;  . CARPAL TUNNEL RELEASE Right 1993  . COLONOSCOPY  last one 04-30-2013  . COLONOSCOPY  WITH PROPOFOL N/A 09/09/2019   Procedure: COLONOSCOPY WITH PROPOFOL;  Surgeon: Doran Stabler, MD;  Location: [facility];  Service: Gastroenterology;  Laterality: N/A;  . CYSTO WITH HYDRODISTENSION N/A 06/08/2015   Procedure: CYSTOSCOPY/HYDRODISTENSION INSTILLATION OF MARCAINE AND PYRIDIUM;  Surgeon: Irine Seal, MD;  Location: [facility];  Service: Urology;  Laterality: N/A;  . CYSTO/  HOD/  BLADDER BX  1990's  . ESOPHAGOGASTRODUODENOSCOPY  last one 09-11-2012  . LAPAROSCOPIC APPENDECTOMY N/A 09/21/2013   Procedure: APPENDECTOMY LAPAROSCOPIC;  Surgeon: Rolm Bookbinder, MD;  Location: [facility];  Service: General;  Laterality: N/A;  . LAPAROSCOPIC CHOLECYSTECTOMY  2002  . LAPAROSCOPY SIGMOID COLECTOMY  10-05-2008   diverticulitis  . LOWER EXTREMITY ANGIOGRAPHY Left 10/14/2018  . LOWER EXTREMITY ANGIOGRAPHY Bilateral 10/14/2018   Procedure: Lower Extremity Angiography;  Surgeon: Lorretta Harp, MD;  Location: [facility];  Service: Cardiovascular;  Laterality: Bilateral;  ILIACS  . MICROLARYNGOSCOPY  09-27-2006   w/ true vocal cord stripping  and bilateral bx's of lesion's (benign)  . PERIPHERAL VASCULAR INTERVENTION  10/14/2018   Procedure: PERIPHERAL VASCULAR INTERVENTION;  Surgeon: Lorretta Harp, MD;  Location: [place] [facility];  Service: Cardiovascular;;  . SINUS SURGERY WITH INSTATRAK  1991  . [place]  . TONSILLECTOMY AND ADENOIDECTOMY  1964    Family History  Problem Relation Age of Onset  . Pancreatic cancer Mother   . Colon polyps Mother   . Pancreatic cancer Father   . Diabetes Father   . Heart disease Father   . Leukemia Father   . Prostate cancer Father   . Hypertension Father   . Colon cancer Neg Hx   . Esophageal cancer Neg Hx   . Rectal cancer Neg Hx   . Stomach cancer Neg Hx     Allergies  Allergen Reactions  . Amoxicillin-Pot Clavulanate Shortness Of Breath, Itching and Swelling  . Avelox [Moxifloxacin  Hcl In Nacl] Swelling    Swelling, itching and shortness of breath  . Ciprofloxacin Shortness Of Breath, Itching and Swelling  . Keflex [Cephalexin] Shortness Of Breath and Swelling  . Penicillins Shortness Of Breath, Itching and Swelling    Did it involve swelling of the face/tongue/throat, SOB, or low BP? Yes Did it involve sudden or severe rash/hives, skin peeling, or any reaction on the inside of your mouth or nose? No Did you need to seek medical attention at a hospital or doctor's office? Yes When did it last happen?Several years If all above answers are "NO", may proceed with cephalosporin use.   . Sulfa Antibiotics Shortness Of Breath, Itching and Swelling  . Prednisone Other (See Comments)    GI irritation; is able to tolerate injections  . Acyclovir And Related Other (See Comments)  Pt does not recall this reaction  . Clindamycin/Lincomycin Itching and Swelling  . Levaquin [Levofloxacin] Hives  . Zyvox [Linezolid] Diarrhea    Current Outpatient Medications on File Prior to Visit  Medication Sig Dispense Refill  . aspirin EC 81 MG EC tablet Take 1 tablet (81 mg total) by mouth daily.    Marland Kitchen atorvastatin (LIPITOR) 40 MG tablet TAKE 1 TABLET EVERY DAY 30 tablet 2  . clopidogrel (PLAVIX) 75 MG tablet TAKE 1 TABLET BY MOUTH EVERY DAY 90 tablet 1  . dicyclomine (BENTYL) 10 MG capsule TAKE 1 CAPSULE BY MOUTH AS NEEDED FOR UP TO 1 DOSE FOR SPASMS. 90 capsule 0  . estradiol (CLIMARA - DOSED IN MG/24 HR) 0.1 mg/24hr patch Place 0.1 mg onto the skin every Monday.  0  . ezetimibe (ZETIA) 10 MG tablet Take 1 tablet (10 mg total) by mouth daily. 90 tablet 1  . famotidine (PEPCID) 20 MG tablet Take 1 tablet (20 mg total) by mouth 2 (two) times daily. 180 tablet 3  . lisinopril (ZESTRIL) 20 MG tablet TAKE 1 TABLET EVERY DAY 90 tablet 1  . LORazepam (ATIVAN) 0.5 MG tablet Take 0.5 mg by mouth at bedtime.   5  . methocarbamol (ROBAXIN) 500 MG tablet Take 1 tablet (500 mg total) by mouth  4 (four) times daily. 45 tablet 0  . polyethylene glycol (MIRALAX) 17 g packet Take 17 g by mouth 2 (two) times daily. 30 each 0  . REPATHA SURECLICK 329 MG/ML SOAJ INJECT 1 DOSE INTO THE SKIN EVERY 14 (FOURTEEN) DAYS. 2 mL 11  . sertraline (ZOLOFT) 50 MG tablet Take 25 mg by mouth at bedtime.      No current facility-administered medications on file prior to visit.    BP 114/66   Pulse 80   Temp 98.9 F (37.2 C)   Resp 18   Ht 5\' 1"  (1.549 m)   Wt 108 lb (49 kg)   SpO2 95%   BMI 20.41 kg/m       Objective:   Physical Exam  General Mental Status- Alert. General Appearance- Not in acute distress.   Skin General: Color- Normal Color. Moisture- Normal Moisture.  Neck Carotid Arteries- Normal color. Moisture- Normal Moisture. No carotid bruits. No JVD.  Chest and Lung Exam Auscultation: Breath Sounds:-Normal.  Cardiovascular Auscultation:Rythm- Regular. Murmurs & Other Heart Sounds:Auscultation of the heart reveals- No Murmurs.  Abdomen Inspection:-Inspeection Normal. Palpation/Percussion:Note:No mass. Palpation and Percussion of the abdomen reveal- Non Tender, Non Distended + BS, no rebound or guarding.   Neurologic Cranial Nerve exam:- CN III-XII intact(No nystagmus), symmetric smile. Strength:- 5/5 equal and symmetric strength both upper and lower extremities.      Assessment & Plan:  You appear to have bronchitis. Rest hydrate and tylenol for fever. Recommend using delsym over the counter for cough and  Doxycycline antibiotic. For your nasal congestion can use nasal saline spray as you report that helps.  If you start to wheeze notify us and would rx albuterol.  If signs and symptoms persist toward end of the week repeat covid test at home.  You should gradually get better. If not then notify us and would recommend a chest xray.  For gerd symptoms continue famotadine and will add carafate.  Follow up in 7-10 days or as needed  [employer], [employer]

## 2020-12-07 NOTE — Patient Instructions (Addendum)
You appear to have bronchitis. Rest hydrate and tylenol for fever. Recommend using delsym over the counter for cough and  Doxycycline antibiotic. For your nasal congestion can use nasal saline spray as you report that helps.  If you start to wheeze notify us and would rx albuterol.  If signs and symptoms persist toward end of the week repeat covid test at home.  You should gradually get better. If not then notify us and would recommend a chest xray.  For gerd symptoms continue famotadine and will add carafate.  Bp well controlled. Continue lisinopril  Follow up in 7-10 days or as needed

## 2020-12-09 DIAGNOSIS — E785 Hyperlipidemia, unspecified: Secondary | ICD-10-CM | POA: Diagnosis not present

## 2020-12-09 LAB — LIPID PANEL
Chol/HDL Ratio: 2.9 ratio (ref 0.0–4.4)
Cholesterol, Total: 134 mg/dL (ref 100–199)
HDL: 46 mg/dL (ref >39)
LDL Chol Calc (NIH): 64 mg/dL (ref 0–99)
Triglycerides: 137 mg/dL (ref 0–149)
VLDL Cholesterol Cal: 24 mg/dL (ref 5–40)

## 2020-12-13 ENCOUNTER — Ambulatory Visit: Payer: Medicare HMO | Admitting: Internal Medicine

## 2020-12-13 ENCOUNTER — Encounter: Payer: Self-pay | Admitting: Internal Medicine

## 2020-12-20 ENCOUNTER — Other Ambulatory Visit: Payer: Self-pay | Admitting: Medical

## 2021-01-05 ENCOUNTER — Other Ambulatory Visit: Payer: Self-pay | Admitting: Gastroenterology

## 2021-01-17 ENCOUNTER — Other Ambulatory Visit: Payer: Self-pay | Admitting: Cardiovascular Disease

## 2021-01-17 DIAGNOSIS — E785 Hyperlipidemia, unspecified: Secondary | ICD-10-CM

## 2021-01-23 ENCOUNTER — Other Ambulatory Visit: Payer: Self-pay | Admitting: Internal Medicine

## 2021-01-25 ENCOUNTER — Ambulatory Visit (INDEPENDENT_AMBULATORY_CARE_PROVIDER_SITE_OTHER): Payer: Medicare HMO | Admitting: Family Medicine

## 2021-01-25 ENCOUNTER — Other Ambulatory Visit: Payer: Self-pay

## 2021-01-25 ENCOUNTER — Ambulatory Visit (HOSPITAL_BASED_OUTPATIENT_CLINIC_OR_DEPARTMENT_OTHER): Payer: Medicare HMO | Admitting: Internal Medicine

## 2021-01-25 VITALS — BP 153/77 | HR 78 | Temp 98.8°F | Resp 12 | Ht 61.5 in | Wt 110.2 lb

## 2021-01-25 DIAGNOSIS — K219 Gastro-esophageal reflux disease without esophagitis: Secondary | ICD-10-CM | POA: Diagnosis not present

## 2021-01-25 DIAGNOSIS — R1013 Epigastric pain: Secondary | ICD-10-CM | POA: Diagnosis not present

## 2021-01-25 MED ORDER — PANTOPRAZOLE SODIUM 40 MG PO TBEC: 40.0000 mg | DELAYED_RELEASE_TABLET | Freq: Every day | ORAL | 3 refills | Status: DC

## 2021-01-25 NOTE — Patient Instructions (Signed)
Food Choices for Gastroesophageal Reflux Disease, Adult °When you have gastroesophageal reflux disease (GERD), the foods you eat and your eating habits are very important. Choosing the right foods can help ease the discomfort of GERD. Consider working with a dietitian to help you make healthy food choices. °What are tips for following this plan? °Reading food labels °Look for foods that are low in saturated fat. Foods that have less than 5% of daily value (DV) of fat and 0 g of trans fats may help with your symptoms. °Cooking °Cook foods using methods other than frying. This may include baking, steaming, grilling, or broiling. These are all methods that do not need a lot of fat for cooking. °To add flavor, try to use herbs that are low in spice and acidity. °Meal planning ° °Choose healthy foods that are low in fat, such as fruits, vegetables, whole grains, low-fat dairy products, lean meats, fish, and poultry. °Eat frequent, small meals instead of three large meals each day. Eat your meals slowly, in a relaxed setting. Avoid bending over or lying down until 2-3 hours after eating. °Limit high-fat foods such as fatty meats or fried foods. °Limit your intake of fatty foods, such as oils, butter, and shortening. °Avoid the following as told by your health care provider: °Foods that cause symptoms. These may be different for different people. Keep a food diary to keep track of foods that cause symptoms. °Alcohol. °Drinking large amounts of liquid with meals. °Eating meals during the 2-3 hours before bed. °Lifestyle °Maintain a healthy weight. Ask your health care provider what weight is healthy for you. If you need to lose weight, work with your health care provider to do so safely. °Exercise for at least 30 minutes on 5 or more days each week, or as told by your health care provider. °Avoid wearing clothes that fit tightly around your waist and chest. °Do not use any products that contain nicotine or tobacco. These  products include cigarettes, chewing tobacco, and vaping devices, such as e-cigarettes. If you need help quitting, ask your health care provider. °Sleep with the head of your bed raised. Use a wedge under the mattress or blocks under the bed frame to raise the head of the bed. °Chew sugar-free gum after mealtimes. °What foods should I eat? °Eat a healthy, well-balanced diet of fruits, vegetables, whole grains, low-fat dairy products, lean meats, fish, and poultry. Each person is different. Foods that may trigger symptoms in one person may not trigger any symptoms in another person. Work with your health care provider to identify foods that are safe for you. °The items listed above may not be a complete list of recommended foods and beverages. Contact a dietitian for more information. °What foods should I avoid? °Limiting some of these foods may help manage the symptoms of GERD. Everyone is different. Consult a dietitian or your health care provider to help you identify the exact foods to avoid, if any. °Fruits °Any fruits prepared with added fat. Any fruits that cause symptoms. For some people this may include citrus fruits, such as oranges, grapefruit, pineapple, and lemons. °Vegetables °Deep-fried vegetables. French fries. Any vegetables prepared with added fat. Any vegetables that cause symptoms. For some people, this may include tomatoes and tomato products, chili peppers, onions and garlic, and horseradish. °Grains °Pastries or quick breads with added fat. °Meats and other proteins °High-fat meats, such as fatty beef or pork, hot dogs, ribs, ham, sausage, salami, and bacon. Fried meat or protein, including fried   fish and fried chicken. Nuts and nut butters, in large amounts. °Dairy °Whole milk and chocolate milk. Sour cream. Cream. Ice cream. Cream cheese. Milkshakes. °Fats and oils °Butter. Margarine. Shortening. Ghee. °Beverages °Coffee and tea, with or without caffeine. Carbonated beverages. Sodas. Energy  drinks. Fruit juice made with acidic fruits, such as orange or grapefruit. Tomato juice. Alcoholic drinks. °Sweets and desserts °Chocolate and cocoa. Donuts. °Seasonings and condiments °Pepper. Peppermint and spearmint. Added salt. Any condiments, herbs, or seasonings that cause symptoms. For some people, this may include curry, hot sauce, or vinegar-based salad dressings. °The items listed above may not be a complete list of foods and beverages to avoid. Contact a dietitian for more information. °Questions to ask your health care provider °Diet and lifestyle changes are usually the first steps that are taken to manage symptoms of GERD. If diet and lifestyle changes do not improve your symptoms, talk with your health care provider about taking medicines. °Where to find more information °[employer] G[employer]intestinal [employer]: aboutgerd.org °Summary °When you have gastroesophageal reflux disease (GERD), food and lifestyle choices may be very helpful in easing the discomfort of GERD. °Eat frequent, small meals instead of three large meals each day. Eat your meals slowly, in a relaxed setting. Avoid bending over or lying down until 2-3 hours after eating. °Limit high-fat foods such as fatty meats or fried foods. °This information is not intended to replace advice given to you by your health care provider. Make sure you discuss any questions you have with your health care provider. °Document Revised: 01/26/2020 Document Reviewed: 01/26/2020 °Elsevier Patient Education © 2022 Elsevier Inc. ° °

## 2021-01-25 NOTE — Progress Notes (Signed)
Subjective:   By signing my name below, I, [name], attest that this documentation has been prepared under the direction and in the presence of Dr. Roma Schanz, DO. 01/25/2021    Patient ID: Leah [name], female    DOB: 29-Jun-1946, 75 y.o.   MRN: [id number]  Chief Complaint  Patient presents with   Abdominal Pain    Epigastric     Abdominal Pain Associated symptoms include nausea.  Patient is in today for a office visit. [name]   Past Medical History:  Diagnosis Date   Allergy    Anemia    Anxiety    Barrett esophagus    Bladder pain    Chronic bronchitis ([place])    per pt last episode july 2016   Complication of anesthesia    slow to wake   COPD (chronic obstructive pulmonary disease) (HCC)    Depression    Diverticulosis    Frequency of urination    GERD (gastroesophageal reflux disease)    Hiatal hernia    History of adenomatous polyp of colon    tubular adenoma   History of chronic gastritis    History of diverticulitis of colon    History of esophageal dilatation    for stricture   History of esophageal spasm    Hyperlipidemia    Hypertension    IBS (irritable bowel syndrome)    Interstitial cystitis    PAD (peripheral artery disease) (HCC)    Productive cough    intermittant   Smokers' cough ([place])    Urgency of urination     Past Surgical History:  Procedure Laterality Date   ABDOMINAL AORTOGRAM N/A 10/14/2018   Procedure: ABDOMINAL AORTOGRAM;  Surgeon: Lorretta Harp, MD;  Location: [facility];  Service: Cardiovascular;  Laterality: N/A;   ABDOMINAL HYSTERECTOMY  1975   ANTERIOR  CERVICAL DECOMP/DISCECTOMY FUSION N/A 09/07/2014   Procedure: Exploration of Fusion and Removal of Anterior Cervical Hematoma;  Surgeon: Newman Pies, MD;  Location: [facility]UR[facility];  Service: Neurosurgery;  Laterality: N/A;   CARPAL TUNNEL RELEASE Right 1993   COLONOSCOPY  last one 04-30-2013   COLONOSCOPY WITH PROPOFOL N/A 09/09/2019   Procedure: COLONOSCOPY WITH PROPOFOL;  Surgeon: Doran Stabler, MD;  Location: [facility];  Service: Gastroenterology;  Laterality: N/A;   CYSTO WITH HYDRODISTENSION N/A 06/08/2015   Procedure: CYSTOSCOPY/HYDRODISTENSION INSTILLATION OF MARCAINE AND PYRIDIUM;  Surgeon: Irine Seal, MD;  Location: [facility];  Service: Urology;  Laterality: N/A;   CYSTO/  HOD/  BLADDER BX  1990's   ESOPHAGOGASTRODUODENOSCOPY  last one 09-11-2012   LAPAROSCOPIC APPENDECTOMY N/A 09/21/2013   Procedure: APPENDECTOMY LAPAROSCOPIC;  Surgeon: Rolm Bookbinder, MD;  Location: [place];  Service: General;  Laterality: N/A;   LAPAROSCOPIC CHOLECYSTECTOMY  2002   LAPAROSCOPY SIGMOID COLECTOMY  10-05-2008   diverticulitis   LOWER EXTREMITY ANGIOGRAPHY Left 10/14/2018   LOWER EXTREMITY ANGIOGRAPHY Bilateral 10/14/2018   Procedure: Lower Extremity Angiography;  Surgeon: Lorretta Harp, MD;  Location: [place] [facility];  Service: Cardiovascular;  Laterality: Bilateral;  ILIACS   MICROLARYNGOSCOPY  09-27-2006   w/ true vocal cord stripping  and  bilateral bx's of lesion's (benign)   PERIPHERAL VASCULAR INTERVENTION  10/14/2018   Procedure: PERIPHERAL VASCULAR INTERVENTION;  Surgeon: Lorretta Harp, MD;  Location: [place] [facility];  Service: Cardiovascular;;   SINUS SURGERY WITH INSTATRAK  1991   TEMPOROMANDIBULAR JOINT SURGERY  1990   TONSILLECTOMY AND ADENOIDECTOMY  1964    Family History  Problem Relation Age of Onset   Pancreatic cancer Mother    Colon polyps Mother    Pancreatic cancer Father    Diabetes Father    Heart disease Father    Leukemia Father     Prostate cancer Father    Hypertension Father    Colon cancer Neg Hx    Esophageal cancer Neg Hx    Rectal cancer Neg Hx    Stomach cancer Neg Hx     Social History   Socioeconomic History   Marital status: Married    Spouse name: Not on file   Number of children: Not on file   Years of education: Not on file   Highest education level: Not on file  Occupational History   Not on file  Tobacco Use   Smoking status: Every Day    Packs/day: 0.75    Years: 47.00    Pack years: 35.25    Types: Cigarettes   Smokeless tobacco: Never   Tobacco comments:    counseled about smoking cessation  Vaping Use   Vaping Use: Never used  Substance and Sexual Activity   Alcohol use: No    Alcohol/week: 0.0 standard drinks   Drug use: No   Sexual activity: Not Currently    Birth control/protection: Post-menopausal  Other Topics Concern   Not on file  Social History Narrative   Not on file   Social Determinants of Health   Financial Resource Strain: Not on file  Food Insecurity: Not on file  Transportation Needs: Not on file  Physical Activity: Not on file  Stress: Not on file  Social Connections: Not on file  Intimate Partner Violence: Not on file    Outpatient Medications Prior to Visit  Medication Sig Dispense Refill   aspirin EC 81 MG EC tablet Take 1 tablet (81 mg total) by mouth daily.     atorvastatin (LIPITOR) 40 MG tablet TAKE 1 TABLET EVERY DAY (NEED MD APPOINTMENT FOR REFILLS) 30 tablet 2   clopidogrel (PLAVIX) 75 MG tablet TAKE 1 TABLET EVERY DAY 90 tablet 2   dicyclomine (BENTYL) 10 MG capsule TAKE 1 CAPSULE EVERY DAY AS NEEDED  FOR ABDOMINAL SPASMS 90 capsule 0   doxycycline (VIBRA-TABS) 100 MG tablet Take 1 tablet (100 mg total) by mouth 2 (two) times daily. Can give caps or generic. 20 tablet 0   estradiol (CLIMARA - DOSED IN MG/24 HR) 0.1 mg/24hr patch Place 0.1 mg onto the skin every Monday.  0   ezetimibe (ZETIA) 10 MG tablet Take 1 tablet (10 mg total) by mouth  daily. 90 tablet 1   famotidine (PEPCID) 20 MG tablet Take 1 tablet (20 mg total) by mouth 2 (two) times daily. 180 tablet 3   lisinopril (ZESTRIL) 20 MG tablet TAKE 1 TABLET EVERY DAY 90 tablet 1   LORazepam (ATIVAN) 0.5 MG tablet Take 0.5 mg by mouth at bedtime.   5   methocarbamol (ROBAXIN) 500 MG tablet Take 1 tablet (500 mg total) by mouth 4 (four) times daily. 45 tablet 0   polyethylene glycol (MIRALAX) 17 g packet Take 17 g by mouth 2 (two) times  daily. 30 each 0   REPATHA SURECLICK 607 MG/ML SOAJ INJECT 1 DOSE INTO THE SKIN EVERY 14 (FOURTEEN) DAYS. 2 mL 11   sertraline (ZOLOFT) 50 MG tablet Take 25 mg by mouth at bedtime.      sucralfate (CARAFATE) 1 g tablet TAKE 1 TABLET BY MOUTH THREE TIMES A DAY 90 tablet 0   No facility-administered medications prior to visit.    Allergies  Allergen Reactions   Amoxicillin-Pot Clavulanate Shortness Of Breath, Itching and Swelling   Avelox [Moxifloxacin Hcl In Nacl] Swelling    Swelling, itching and shortness of breath   Ciprofloxacin Shortness Of Breath, Itching and Swelling   Keflex [Cephalexin] Shortness Of Breath and Swelling   Penicillins Shortness Of Breath, Itching and Swelling    Did it involve swelling of the face/tongue/throat, SOB, or low BP? Yes Did it involve sudden or severe rash/hives, skin peeling, or any reaction on the inside of your mouth or nose? No Did you need to seek medical attention at a hospital or doctor's office? Yes When did it last happen?      Several years If all above answers are "NO", may proceed with cephalosporin use.    Sulfa Antibiotics Shortness Of Breath, Itching and Swelling   Prednisone Other (See Comments)    GI irritation; is able to tolerate injections   Acyclovir And Related Other (See Comments)    Pt does not recall this reaction   Clindamycin/Lincomycin Itching and Swelling   Levaquin [Levofloxacin] Hives   Zyvox [Linezolid] Diarrhea    Review of Systems  Gastrointestinal:  Positive  for abdominal pain (Tight abdominal pain) and nausea.  Musculoskeletal:        (+)occasional spasms in upper mid abdomen      Objective:    Physical Exam Constitutional:      General: She is not in acute distress.    Appearance: Normal appearance. She is not ill-appearing.  HENT:     Head: Normocephalic and atraumatic.     Right Ear: External ear normal.     Left Ear: External ear normal.  Eyes:     Extraocular Movements: Extraocular movements intact.     Pupils: Pupils are equal, round, and reactive to light.  Cardiovascular:     Rate and Rhythm: Normal rate and regular rhythm.     Pulses: Normal pulses.     Heart sounds: Normal heart sounds. No murmur heard.   No gallop.  Pulmonary:     Effort: Pulmonary effort is normal. No respiratory distress.     Breath sounds: Normal breath sounds. No wheezing, rhonchi or rales.  Abdominal:     Palpations: Abdomen is soft.     Tenderness: There is abdominal tenderness (Mid-epigastric) in the epigastric area. There is guarding.  Skin:    General: Skin is warm and dry.  Neurological:     Mental Status: She is alert and oriented to person, place, and time.  Psychiatric:        Behavior: Behavior normal.    BP (!) 153/77 (BP Location: Left Arm, Cuff Size: Normal)   Pulse 78   Temp 98.8 F (37.1 C)   Resp 12   Ht 5' 1.5" (1.562 m)   Wt 110 lb 3.2 oz (50 kg)   SpO2 96%   BMI 20.48 kg/m  Wt Readings from Last 3 Encounters:  01/25/21 110 lb 3.2 oz (50 kg)  12/07/20 108 lb (49 kg)  10/29/20 115 lb (52.2 kg)    Diabetic Foot  Exam - Simple   No data filed    Lab Results  Component Value Date   WBC 6.9 01/25/2021   HGB 14.1 01/25/2021   HCT 41.8 01/25/2021   PLT 221.0 01/25/2021   GLUCOSE 102 (H) 01/25/2021   CHOL 134 12/09/2020   TRIG 137 12/09/2020   HDL 46 12/09/2020   LDLDIRECT 151.0 11/07/2016   LDLCALC 64 12/09/2020   ALT 8 01/25/2021   AST 13 01/25/2021   NA 138 01/25/2021   K 4.0 01/25/2021   CL 99  01/25/2021   CREATININE 0.89 01/25/2021   BUN 11 01/25/2021   CO2 31 01/25/2021   TSH 2.47 09/07/2020   INR 1.0 05/21/2009   HGBA1C 6.1 12/18/2017   MICROALBUR <0.7 11/09/2014    Lab Results  Component Value Date   TSH 2.47 09/07/2020   Lab Results  Component Value Date   WBC 6.9 01/25/2021   HGB 14.1 01/25/2021   HCT 41.8 01/25/2021   MCV 92.0 01/25/2021   PLT 221.0 01/25/2021   Lab Results  Component Value Date   NA 138 01/25/2021   K 4.0 01/25/2021   CO2 31 01/25/2021   GLUCOSE 102 (H) 01/25/2021   BUN 11 01/25/2021   CREATININE 0.89 01/25/2021   BILITOT 0.3 01/25/2021   ALKPHOS 61 01/25/2021   AST 13 01/25/2021   ALT 8 01/25/2021   PROT 7.0 01/25/2021   ALBUMIN 4.4 01/25/2021   CALCIUM 9.4 01/25/2021   ANIONGAP 5 09/08/2019   GFR 64.67 01/25/2021   Lab Results  Component Value Date   CHOL 134 12/09/2020   Lab Results  Component Value Date   HDL 46 12/09/2020   Lab Results  Component Value Date   LDLCALC 64 12/09/2020   Lab Results  Component Value Date   TRIG 137 12/09/2020   Lab Results  Component Value Date   CHOLHDL 2.9 12/09/2020   Lab Results  Component Value Date   HGBA1C 6.1 12/18/2017       Assessment & Plan:   Problem List Items Addressed This Visit       Unprioritized   GERD (gastroesophageal reflux disease) - Primary   Relevant Medications   pantoprazole (PROTONIX) 40 MG tablet   Other Relevant Orders   CBC with Differential/Platelet (Completed)   H. pylori antibody, IgG (Completed)   Comprehensive metabolic panel (Completed)   Other Visit Diagnoses     Midepigastric pain       Relevant Orders   Amylase (Completed)   Lipase (Completed)        Meds ordered this encounter  Medications   pantoprazole (PROTONIX) 40 MG tablet    Sig: Take 1 tablet (40 mg total) by mouth daily.    Dispense:  30 tablet    Refill:  3    I, [name], DO, personally preformed the services described in this  documentation.  All medical record entries made by the scribe were at my direction and in my presence.  I have reviewed the chart and discharge instructions (if applicable) and agree that the record reflects my personal performance and is accurate and complete. @ENCDATE @     [name], DO

## 2021-01-26 LAB — COMPREHENSIVE METABOLIC PANEL
ALT: 8 U/L (ref 0–35)
AST: 13 U/L (ref 0–37)
Albumin: 4.4 g/dL (ref 3.5–5.2)
Alkaline Phosphatase: 61 U/L (ref 39–117)
BUN: 11 mg/dL (ref 6–23)
CO2: 31 mEq/L (ref 19–32)
Calcium: 9.4 mg/dL (ref 8.4–10.5)
Chloride: 99 mEq/L (ref 96–112)
Creatinine, Ser: 0.89 mg/dL (ref 0.40–1.20)
GFR: 64.67 mL/min (ref >60.00)
Glucose, Bld: 102 mg/dL — ABNORMAL HIGH (ref 70–99)
Potassium: 4 mEq/L (ref 3.5–5.1)
Sodium: 138 mEq/L (ref 135–145)
Total Bilirubin: 0.3 mg/dL (ref 0.2–1.2)
Total Protein: 7 g/dL (ref 6.0–8.3)

## 2021-01-26 LAB — CBC WITH DIFFERENTIAL/PLATELET
Basophils Absolute: 0.1 10*3/uL (ref 0.0–0.1)
Basophils Relative: 1.8 % (ref 0.0–3.0)
Eosinophils Absolute: 0.3 10*3/uL (ref 0.0–0.7)
Eosinophils Relative: 3.9 % (ref 0.0–5.0)
HCT: 41.8 % (ref 36.0–46.0)
Hemoglobin: 14.1 g/dL (ref 12.0–15.0)
Lymphocytes Relative: 29.5 % (ref 12.0–46.0)
Lymphs Abs: 2 10*3/uL (ref 0.7–4.0)
MCHC: 33.7 g/dL (ref 30.0–36.0)
MCV: 92 fl (ref 78.0–100.0)
Monocytes Absolute: 0.5 10*3/uL (ref 0.1–1.0)
Monocytes Relative: 6.7 % (ref 3.0–12.0)
Neutro Abs: 4 10*3/uL (ref 1.4–7.7)
Neutrophils Relative %: 58.1 % (ref 43.0–77.0)
Platelets: 221 10*3/uL (ref 150.0–400.0)
RBC: 4.55 Mil/uL (ref 3.87–5.11)
RDW: 13.4 % (ref 11.5–15.5)
WBC: 6.9 10*3/uL (ref 4.0–10.5)

## 2021-01-26 LAB — AMYLASE: Amylase: 23 U/L — ABNORMAL LOW (ref 27–131)

## 2021-01-26 LAB — LIPASE: Lipase: 38 U/L (ref 11.0–59.0)

## 2021-01-26 LAB — H. PYLORI ANTIBODY, IGG: H Pylori IgG: NEGATIVE

## 2021-01-28 ENCOUNTER — Telehealth: Payer: Self-pay | Admitting: *Deleted

## 2021-01-28 NOTE — Telephone Encounter (Signed)
-----   Message from [name], DO sent at 01/27/2021  4:36 PM EDT ----- Overall good How is pt feeling ?

## 2021-01-28 NOTE — Telephone Encounter (Signed)
Patient stated that she feels about the same and no new symptoms.  She would like to see if you can order a CT scan?

## 2021-01-28 NOTE — Telephone Encounter (Signed)
Pt say she is unable to see him until 02/24/21. Should she schedule the appointment and then we order the CT?

## 2021-01-29 ENCOUNTER — Other Ambulatory Visit: Payer: Self-pay | Admitting: Medical

## 2021-01-30 ENCOUNTER — Other Ambulatory Visit: Payer: Self-pay | Admitting: Family Medicine

## 2021-01-30 DIAGNOSIS — R1013 Epigastric pain: Secondary | ICD-10-CM

## 2021-01-31 ENCOUNTER — Encounter: Payer: Self-pay | Admitting: Family Medicine

## 2021-02-02 ENCOUNTER — Other Ambulatory Visit: Payer: Self-pay | Admitting: Family Medicine

## 2021-02-02 DIAGNOSIS — I10 Essential (primary) hypertension: Secondary | ICD-10-CM

## 2021-02-03 ENCOUNTER — Telehealth: Payer: Self-pay | Admitting: Internal Medicine

## 2021-02-03 NOTE — Telephone Encounter (Signed)
PA Case: [id number], Status: Approved, Coverage Starts on: 07/31/2020 12:00:00 AM, Coverage Ends on: 07/30/2021 12:00:00 AM

## 2021-02-03 NOTE — Telephone Encounter (Signed)
PA for repatha sureclick submitted via CMM (Key: [id number])

## 2021-02-10 DIAGNOSIS — F325 Major depressive disorder, single episode, in full remission: Secondary | ICD-10-CM | POA: Diagnosis not present

## 2021-02-16 ENCOUNTER — Telehealth: Payer: Self-pay | Admitting: Internal Medicine

## 2021-02-16 ENCOUNTER — Other Ambulatory Visit: Payer: Self-pay | Admitting: Medical

## 2021-02-16 ENCOUNTER — Other Ambulatory Visit: Payer: Self-pay | Admitting: Gastroenterology

## 2021-02-16 ENCOUNTER — Other Ambulatory Visit: Payer: Self-pay | Admitting: Family Medicine

## 2021-02-16 DIAGNOSIS — K219 Gastro-esophageal reflux disease without esophagitis: Secondary | ICD-10-CM

## 2021-02-16 NOTE — Telephone Encounter (Signed)
Spoke with patient and advised a sample x1 of repatha has been provided. She will pick up on 02/22/21 when she is in town for her husband's appointment.   Advised if recurrent issues in getting Rx from pharmacy, we can try to provide additional samples

## 2021-02-16 NOTE — Telephone Encounter (Signed)
Pt is calling requesting the nurse give her a call back the pharmacy has missed up her medication

## 2021-02-16 NOTE — Telephone Encounter (Signed)
Spoke with patient of Dr. Debara Pickett who reports she had been getting Repatha on a 3 month supply. She had been getting shorted a few months and now is told she cannot get any of her medication until September.   She states she has not had any medication in July   Feb/March/April - picked up 3 months  May/June - shorted July, picked up 2 months per patient   Advised patient will call pharmacy to check on this Spoke with [facility] staff - they said they take pictures of all the medications dispensed and they have record they have provided a 3 month supply at each refill, thus insurance will not pay for medication until her next fill is due, which is sometime in August.

## 2021-02-23 ENCOUNTER — Telehealth: Payer: Self-pay

## 2021-02-23 NOTE — Telephone Encounter (Signed)
-----   Message from [facility] sent at 02/07/2021  2:56 PM EDT ----- Juluis Rainier, spoke with [facility] regarding CT and they withdrew the referral states due to medical necessity and suggest pt sees the GI doctor first

## 2021-02-23 NOTE — Telephone Encounter (Signed)
Fowarded into a telephone note for documentation.

## 2021-02-24 ENCOUNTER — Ambulatory Visit: Payer: Medicare HMO | Admitting: Gastroenterology

## 2021-02-24 ENCOUNTER — Encounter: Payer: Self-pay | Admitting: Gastroenterology

## 2021-02-24 ENCOUNTER — Telehealth: Payer: Self-pay

## 2021-02-24 VITALS — BP 134/66 | HR 62 | Ht 61.5 in | Wt 109.0 lb

## 2021-02-24 DIAGNOSIS — R1013 Epigastric pain: Secondary | ICD-10-CM | POA: Diagnosis not present

## 2021-02-24 DIAGNOSIS — R1319 Other dysphagia: Secondary | ICD-10-CM | POA: Diagnosis not present

## 2021-02-24 DIAGNOSIS — K449 Diaphragmatic hernia without obstruction or gangrene: Secondary | ICD-10-CM | POA: Diagnosis not present

## 2021-02-24 NOTE — Progress Notes (Signed)
Will do, thanks.  Symptoms not typical for NOMI.  Still would be good to know if any significant stenoses.  - HD

## 2021-02-24 NOTE — Progress Notes (Signed)
[facility] Progress Note  Chief Complaint: Dysphagia and epigastric pain  Subjective  History:  Leah [name] came back to see me for about a month of midepigastric pain just below the breastbone, nonradiating.  Sometimes there briefly other times most of the day, not clearly related to eating.  She is also noted increase in dysphagia with food feeling stuck in the lower chest at least several days a week, and sometimes things also feeling slow to pass up in the neck. Appetite generally good and weight stable.  Continues to smoke.  From 10/07/2019 office note: "History of chronic abdominal pain and constipation.  Previous sigmoid resection for diverticulitis, poor preparation on most recent colonoscopy.  Has suspected decreased motility related to previous sigmoid resection, also atypical anastomosis anatomy with tortuosity and additional left colon redundancy.  Multiple prior abdominal pelvic surgeries and previous SBO that did not require further surgery.  Her previous sigmoid resection was reportedly complicated by prolonged abdominal wound healing. Seen in clinic February 4 with about 2 weeks of acute on chronic constipation and escalating generalized abdominal pain.  CT scan showed partial small bowel obstruction. Hospitalized several days, no NG tube drainage or further surgery required.  Colonoscopy performed, similar findings before, also still poor preparation with patient unable to tolerate total PEG prep"  Last EGD from 08/13/2018: "A 3 cm hiatal hernia was present. - One benign-appearing, intrinsic moderate stenosis was found at the gastroesophageal junction. This stenosis measured 1.3 cm (inner diameter) x less than one cm (in length). The stenosis was traversed. The distal esophagus was mildly tortuous. A guidewire was placed and the scope was withdrawn. Dilation was performed with a Savary dilator with mild resistance at 17 mm. - Multiple diminutive sessile fundic gland  polyps were found in the gastric fundus and in the gastric body. - The cardia and gastric fundus were normal on retroflexion. - The examined duodenum was normal"   ROS: Cardiovascular:  no chest pain Respiratory: no dyspnea Fatigue Chronic cough Arthralgias Remainder of systems negative except as above  The patient's Past Medical, Family and Social History were reviewed and are on file in the EMR. Past Medical History:  Diagnosis Date   Allergy    Anemia    Anxiety    Barrett esophagus    Bladder pain    Chronic bronchitis ([place])    per pt last episode july 2016   Complication of anesthesia    slow to wake   COPD (chronic obstructive pulmonary disease) (HCC)    Depression    Diverticulosis    Frequency of urination    GERD (gastroesophageal reflux disease)    Hiatal hernia    History of adenomatous polyp of colon    tubular adenoma   History of chronic gastritis    History of diverticulitis of colon    History of esophageal dilatation    for stricture   History of esophageal spasm    Hyperlipidemia    Hypertension    IBS (irritable bowel syndrome)    Interstitial cystitis    PAD (peripheral artery disease) (HCC)    Productive cough    intermittant   Smokers' cough ([place])    Urgency of urination    Last cardiology office note by Dr. Alvester Chou from 10/29/2020 was reviewed.  Outlines patient's family history of vascular disease, patient's personal history of PAD with stents in 2020.  Last Doppler studies August 2021.  It was noted patient was still smoking at the time of  last cardiology visit.  Objective:  Med list reviewed  Current Outpatient Medications:    aspirin EC 81 MG EC tablet, Take 1 tablet (81 mg total) by mouth daily., Disp: , Rfl:    atorvastatin (LIPITOR) 40 MG tablet, TAKE 1 TABLET EVERY DAY (NEED MD APPOINTMENT FOR REFILLS), Disp: 30 tablet, Rfl: 2   clopidogrel (PLAVIX) 75 MG tablet, TAKE 1 TABLET EVERY DAY, Disp: 90 tablet, Rfl: 2   dicyclomine  (BENTYL) 10 MG capsule, TAKE 1 CAPSULE BY MOUTH AS NEEDED FOR UP TO 1 DOSE FOR SPASMS., Disp: 90 capsule, Rfl: 0   doxycycline (VIBRA-TABS) 100 MG tablet, Take 1 tablet (100 mg total) by mouth 2 (two) times daily. Can give caps or generic., Disp: 20 tablet, Rfl: 0   estradiol (CLIMARA - DOSED IN MG/24 HR) 0.1 mg/24hr patch, Place 0.1 mg onto the skin every Monday., Disp: , Rfl: 0   ezetimibe (ZETIA) 10 MG tablet, Take 1 tablet (10 mg total) by mouth daily., Disp: 90 tablet, Rfl: 1   famotidine (PEPCID) 20 MG tablet, Take 1 tablet (20 mg total) by mouth 2 (two) times daily., Disp: 180 tablet, Rfl: 3   lisinopril (ZESTRIL) 20 MG tablet, TAKE 1 TABLET EVERY DAY, Disp: 90 tablet, Rfl: 1   LORazepam (ATIVAN) 0.5 MG tablet, Take 0.5 mg by mouth at bedtime. , Disp: , Rfl: 5   pantoprazole (PROTONIX) 40 MG tablet, TAKE 1 TABLET BY MOUTH EVERY DAY, Disp: 90 tablet, Rfl: 1   polyethylene glycol (MIRALAX) 17 g packet, Take 17 g by mouth 2 (two) times daily., Disp: 30 each, Rfl: 0   REPATHA SURECLICK XX123456 MG/ML SOAJ, INJECT 1 DOSE INTO THE SKIN EVERY 14 (FOURTEEN) DAYS., Disp: 2 mL, Rfl: 11   sertraline (ZOLOFT) 50 MG tablet, Take 25 mg by mouth at bedtime. , Disp: , Rfl:    sucralfate (CARAFATE) 1 g tablet, TAKE 1 TABLET BY MOUTH THREE TIMES A DAY, Disp: 90 tablet, Rfl: 0   Vital signs in last 24 hrs: Vitals:   02/24/21 1325  BP: 134/66  Pulse: 62   Wt Readings from Last 3 Encounters:  02/24/21 109 lb (49.4 kg)  01/25/21 110 lb 3.2 oz (50 kg)  12/07/20 108 lb (49 kg)    Physical Exam  Chronically ill-appearing, raspy vocal quality as before. HEENT: sclera anicteric, oral mucosa moist without lesions Neck: supple, no thyromegaly, JVD or lymphadenopathy Cardiac: RRR without murmurs, S1S2 heard, no peripheral edema Pulm: clear to auscultation bilaterally, normal RR and effort noted Abdomen: soft, epigastric tenderness, with active bowel sounds. No guarding or palpable hepatosplenomegaly.+ abdominal  bruit Skin; warm and dry, no jaundice or rash  Labs:  Saw PCP 01/25/21:  CBC Latest Ref Rng & Units 01/25/2021 09/07/2020 04/01/2020  WBC 4.0 - 10.5 K/uL 6.9 7.1 6.8  Hemoglobin 12.0 - 15.0 g/dL 14.1 14.1 14.1  Hematocrit 36.0 - 46.0 % 41.8 42.1 42.1  Platelets 150.0 - 400.0 K/uL 221.0 225.0 232   CMP Latest Ref Rng & Units 01/25/2021 09/07/2020 04/01/2020  Glucose 70 - 99 mg/dL 102(H) 84 71  BUN 6 - 23 mg/dL '11 15 16  '$ Creatinine 0.40 - 1.20 mg/dL 0.89 0.85 1.02(H)  Sodium 135 - 145 mEq/L 138 139 140  Potassium 3.5 - 5.1 mEq/L 4.0 4.4 4.1  Chloride 96 - 112 mEq/L 99 101 104  CO2 19 - 32 mEq/L 31 34(H) 26  Calcium 8.4 - 10.5 mg/dL 9.4 9.3 9.1  Total Protein 6.0 - 8.3 g/dL 7.0 6.3  6.3  Total Bilirubin 0.2 - 1.2 mg/dL 0.3 0.2 0.5  Alkaline Phos 39 - 117 U/L 61 66 -  AST 0 - 37 U/L '13 12 12  '$ ALT 0 - 35 U/L '8 9 6    '$ H. pylori IgG antibody negative ___________________________________________ Radiologic studies:  March 2016 barium study from proximal pharyngeal esophageal impingement from cervical spine surgery ____________________________________________ Other:   _____________________________________________ Assessment & Plan  Assessment: Encounter Diagnoses  Name Primary?   Epigastric pain Yes   Esophageal dysphagia    Hiatal hernia    Recurrence of esophageal dysphagia, related to distal esophageal tortuosity from hiatal hernia and possible stricture.  The more proximal dysphagia in the neck is related to her cervical spine condition. Epigastric pain somewhat more difficult to characterize.  She has had a previous cholecystectomy.  Neither she nor I think it is reminiscent of her previous PSBO symptoms. Possibly enlarging hiatal hernia.  Incidental abdominal bruit heard on exam in a patient with known vascular disease. Her symptoms do not sound like nonocclusive mesenteric ischemia.  Plan: Upper endoscopy with possible dilation of stricture encountered.  The benefits and  risks of the planned procedure were described in detail with the patient or (when appropriate) their health care proxy.  Risks were outlined as including, but not limited to, bleeding, infection, perforation, adverse medication reaction leading to cardiac or pulmonary decompensation, pancreatitis (if ERCP).  The limitation of incomplete mucosal visualization was also discussed.  No guarantees or warranties were given.  Needs to be off Plavix 5 days prior, will clear with Dr. Gwenlyn Found (they have been agreeable in the past)  I will also send my note to Dr. Gwenlyn Found for his opinion on whether abdominal ultrasound or CTA would be better to evaluate the bruit given this patient's vascular history.  She was advised to stop smoking, but seems disinclined to do so.  Continue current acid suppression therapy.  32 minutes were spent on this encounter (including chart review, history/exam, counseling/coordination of care, and documentation) > 50% of that time was spent on counseling and coordination of care.  Topics discussed included: See above.  Nelida Meuse III

## 2021-02-24 NOTE — Telephone Encounter (Signed)
 [facility] Pre-operative Risk Assessment     Request for surgical clearance:     Endoscopy Procedure  What type of surgery is being performed?     EGD   When is this surgery scheduled?     03-24-2021  What type of clearance is required ?   Pharmacy  Are there any medications that need to be held prior to surgery and how long? Yes, Plavix, 5 days  Practice name and name of physician performing surgery?      [facility]  What is your office phone and fax number?      Phone- [phone]  [phone]  Anesthesia type (None, local, MAC, general) ?       MAC

## 2021-02-24 NOTE — Progress Notes (Signed)
Patient seen in clinic 02/23/21.  Note routed to cardiologist Gwenlyn Found) for opinion on imaging due to physical exam findings. He recommends an abdominal CT angiogram.  Indication: abdominal bruit in patient with PAD.  Please let patient know this and place the order.  Thank you.  - HD

## 2021-02-24 NOTE — Patient Instructions (Signed)
If you are age 73 or older, your body mass index should be between 23-30. Your Body mass index is 20.26 kg/m. If this is out of the aforementioned range listed, please consider follow up with your Primary Care Provider.  If you are age 31 or younger, your body mass index should be between 19-25. Your Body mass index is 20.26 kg/m. If this is out of the aformentioned range listed, please consider follow up with your Primary Care Provider.   __________________________________________________________  The [facility] providers would like to encourage you to use [facility] to communicate with providers for non-urgent requests or questions.  Due to long hold times on the telephone, sending your provider a message by [facility] may be a faster and more efficient way to get a response.  Please allow 48 business hours for a response.  Please remember that this is for non-urgent requests.   You have been scheduled for an endoscopy. Please follow written instructions given to you at your visit today. If you use inhalers (even only as needed), please bring them with you on the day of your procedure.   It was a pleasure to see you today!  Thank you for trusting me with your gastrointestinal care!

## 2021-02-24 NOTE — Telephone Encounter (Signed)
[name] Leah [name] 75 year old female is requesting preoperative cardiac evaluation for endoscopy.  She was last seen in the clinic on 10/29/2020.  During that time she continues to do well.  Her lower extremity Dopplers 03/03/2020 showed normal ABIs with bilateral patent iliac stent.  Recommendation was made for annual follow-up studies.  Her PMH also includes hypertension, coronary calcification, hiatal hernia, COPD, IBS, and SBO.  May her Plavix be held prior to her procedure?  Thank you for your help.  Please direct response to [facility] preop pool.  Jossie Ng. Anicka Stuckert NP-C    02/24/2021, 2:47 PM [address] Office [phone] Fax [phone]

## 2021-02-28 ENCOUNTER — Telehealth: Payer: Self-pay

## 2021-02-28 ENCOUNTER — Other Ambulatory Visit: Payer: Self-pay

## 2021-02-28 ENCOUNTER — Other Ambulatory Visit (HOSPITAL_COMMUNITY): Payer: Self-pay | Admitting: Cardiovascular Disease

## 2021-02-28 DIAGNOSIS — Z95828 Presence of other vascular implants and grafts: Secondary | ICD-10-CM

## 2021-02-28 DIAGNOSIS — I739 Peripheral vascular disease, unspecified: Secondary | ICD-10-CM

## 2021-02-28 DIAGNOSIS — R0989 Other specified symptoms and signs involving the circulatory and respiratory systems: Secondary | ICD-10-CM

## 2021-02-28 NOTE — Telephone Encounter (Signed)
Left message to return call about imaging ordered, this was placed with [facility] Also needs Plavix hold info from Dr Gwenlyn Found, see additional communication for details

## 2021-02-28 NOTE — Telephone Encounter (Signed)
Left message to return call 

## 2021-02-28 NOTE — Telephone Encounter (Signed)
-----   Message from Doran Stabler, MD sent at 02/24/2021  9:44 PM EDT -----    ----- Message ----- From: Lorretta Harp, MD Sent: 02/24/2021   6:34 PM EDT To: Reserve, MD  An abdominal CTA would be perfect Mallie Mussel to R/O mesenteric ischemia. Please order an let me know the result. I have been doing mesenteric intervention for the last 30 years.  JJB ----- Message ----- From: Doran Stabler, MD Sent: 02/24/2021   2:31 PM EDT To: Lorretta Harp, MD  Dr. Gwenlyn Found,    I sent you my note on this mutual patient.   I heard an abdominal bruit on exam, think it is unrelated to her GI symptoms, but would like your opinion on concern due to underlying vascular disease as well as whether an abdominal US or CTA would be preferred to evaluate.  Thanks  - [facility]

## 2021-03-02 NOTE — Telephone Encounter (Signed)
Per Dr. Gwenlyn Found, Novinger to hold anti platelet meds for endoscopy

## 2021-03-02 NOTE — Telephone Encounter (Signed)
Pt has been notified and aware. She states understanding to hold Plavix 5 days before

## 2021-03-04 ENCOUNTER — Ambulatory Visit (HOSPITAL_COMMUNITY)
Admission: RE | Admit: 2021-03-04 | Discharge: 2021-03-04 | Disposition: A | Discharge status: Home / Self Care | Payer: Medicare HMO | Source: Ambulatory Visit | Attending: Gastroenterology | Admitting: Gastroenterology

## 2021-03-04 ENCOUNTER — Other Ambulatory Visit: Payer: Self-pay

## 2021-03-04 DIAGNOSIS — I700 Atherosclerosis of aorta: Secondary | ICD-10-CM | POA: Diagnosis not present

## 2021-03-04 DIAGNOSIS — I739 Peripheral vascular disease, unspecified: Secondary | ICD-10-CM | POA: Diagnosis not present

## 2021-03-04 DIAGNOSIS — D734 Cyst of spleen: Secondary | ICD-10-CM | POA: Diagnosis not present

## 2021-03-04 DIAGNOSIS — R0989 Other specified symptoms and signs involving the circulatory and respiratory systems: Secondary | ICD-10-CM | POA: Insufficient documentation

## 2021-03-04 DIAGNOSIS — K7689 Other specified diseases of liver: Secondary | ICD-10-CM | POA: Diagnosis not present

## 2021-03-04 LAB — POCT I-STAT CREATININE: Creatinine, Ser: 0.9 mg/dL (ref 0.44–1.00)

## 2021-03-04 MED ORDER — IOHEXOL 350 MG/ML SOLN
100.0000 mL | Freq: Once | INTRAVENOUS | Status: AC | PRN
  Administered 2021-03-04: 100 mL via INTRAVENOUS

## 2021-03-07 ENCOUNTER — Encounter (HOSPITAL_COMMUNITY): Payer: Medicare HMO

## 2021-03-14 ENCOUNTER — Encounter: Payer: Self-pay | Admitting: *Deleted

## 2021-03-14 ENCOUNTER — Other Ambulatory Visit (HOSPITAL_COMMUNITY): Payer: Self-pay | Admitting: Cardiovascular Disease

## 2021-03-14 ENCOUNTER — Other Ambulatory Visit: Payer: Self-pay

## 2021-03-14 ENCOUNTER — Ambulatory Visit (HOSPITAL_COMMUNITY)
Admission: RE | Admit: 2021-03-14 | Discharge: 2021-03-14 | Disposition: A | Discharge status: Home / Self Care | Payer: Medicare HMO | Source: Ambulatory Visit | Attending: Internal Medicine | Admitting: Internal Medicine

## 2021-03-14 ENCOUNTER — Ambulatory Visit (HOSPITAL_BASED_OUTPATIENT_CLINIC_OR_DEPARTMENT_OTHER)
Admission: RE | Admit: 2021-03-14 | Discharge: 2021-03-14 | Disposition: A | Discharge status: Home / Self Care | Payer: Medicare HMO | Source: Ambulatory Visit | Attending: Internal Medicine | Admitting: Internal Medicine

## 2021-03-14 DIAGNOSIS — Z95828 Presence of other vascular implants and grafts: Secondary | ICD-10-CM

## 2021-03-14 DIAGNOSIS — I739 Peripheral vascular disease, unspecified: Secondary | ICD-10-CM | POA: Insufficient documentation

## 2021-03-15 ENCOUNTER — Other Ambulatory Visit: Payer: Self-pay

## 2021-03-15 ENCOUNTER — Other Ambulatory Visit: Payer: Self-pay | Admitting: Gastroenterology

## 2021-03-15 DIAGNOSIS — I739 Peripheral vascular disease, unspecified: Secondary | ICD-10-CM

## 2021-03-15 DIAGNOSIS — Z95828 Presence of other vascular implants and grafts: Secondary | ICD-10-CM

## 2021-03-21 ENCOUNTER — Other Ambulatory Visit: Payer: Self-pay | Admitting: Cardiovascular Disease

## 2021-03-21 DIAGNOSIS — E785 Hyperlipidemia, unspecified: Secondary | ICD-10-CM

## 2021-03-22 ENCOUNTER — Other Ambulatory Visit: Payer: Self-pay | Admitting: Medical

## 2021-03-24 ENCOUNTER — Encounter: Payer: Self-pay | Admitting: Gastroenterology

## 2021-03-24 ENCOUNTER — Other Ambulatory Visit: Payer: Self-pay

## 2021-03-24 ENCOUNTER — Ambulatory Visit (AMBULATORY_SURGERY_CENTER): Payer: Medicare HMO | Admitting: Gastroenterology

## 2021-03-24 VITALS — BP 174/87 | HR 67 | Temp 98.0°F | Resp 24 | Ht 61.0 in | Wt 109.0 lb

## 2021-03-24 DIAGNOSIS — R1013 Epigastric pain: Secondary | ICD-10-CM | POA: Diagnosis not present

## 2021-03-24 DIAGNOSIS — R1319 Other dysphagia: Secondary | ICD-10-CM

## 2021-03-24 DIAGNOSIS — K222 Esophageal obstruction: Secondary | ICD-10-CM

## 2021-03-24 DIAGNOSIS — Z8719 Personal history of other diseases of the digestive system: Secondary | ICD-10-CM

## 2021-03-24 DIAGNOSIS — K449 Diaphragmatic hernia without obstruction or gangrene: Secondary | ICD-10-CM

## 2021-03-24 DIAGNOSIS — R1310 Dysphagia, unspecified: Secondary | ICD-10-CM | POA: Diagnosis not present

## 2021-03-24 MED ORDER — SODIUM CHLORIDE 0.9 % IV SOLN: 500.0000 mL | Freq: Once | INTRAVENOUS | Status: DC

## 2021-03-24 NOTE — Progress Notes (Signed)
Sedate, gd SR's, VSS, report to RN 

## 2021-03-24 NOTE — Op Note (Addendum)
Leah [name]: Leah [name] Procedure Date: 03/24/2021 10:24 AM MRN: [id number] Endoscopist: Mallie Mussel L. Loletha Carrow , MD Age: 75 Referring MD:  Date of Birth: July 24, 1946 Gender: Female Account #: [id number] Procedure:                Upper GI endoscopy Indications:              Epigastric abdominal pain (acute on chronic, Hx                            adhesions and PSBO), Esophageal dysphagia - known                            hiatal hernia and stricture Medicines:                Monitored Anesthesia Care Procedure:                Pre-Anesthesia Assessment:                           - Prior to the procedure, a History and Physical                            was performed, and patient medications and                            allergies were reviewed. The patient's tolerance of                            previous anesthesia was also reviewed. The risks                            and benefits of the procedure and the sedation                            options and risks were discussed with the patient.                            All questions were answered, and informed consent                            was obtained. Prior Anticoagulants: The patient has                            taken Plavix (clopidogrel), last dose was 5 days                            prior to procedure. ASA Grade Assessment: III - A                            patient with severe systemic disease. After                            reviewing the risks and benefits, the patient was  deemed in satisfactory condition to undergo the                            procedure.                           After obtaining informed consent, the endoscope was                            passed under direct vision. Throughout the                            procedure, the patient's blood pressure, pulse, and                            oxygen saturations were monitored continuously. The                             Endoscope was introduced through the mouth, and                            advanced to the second part of duodenum. The upper                            GI endoscopy was accomplished without difficulty.                            The patient tolerated the procedure well. Scope In: Scope Out: Findings:                 The distal esophagus was tortuous due to the hiatal                            hernia.                           A 2 cm hiatal hernia was present.                           One benign-appearing, intrinsic moderate stenosis                            was found at the gastroesophageal junction. This                            stenosis measured 1.2 cm (inner diameter) x less                            than one cm (in length). The stenosis was                            traversed. A TTS dilator was passed through the                            scope. Dilation with a 16-17-18 mm balloon  dilator                            was performed to 18 mm. The dilation site was                            examined and showed mild mucosal disruption and                            mild improvement in luminal narrowing.                           The stomach was normal.                           The cardia and gastric fundus were normal on                            retroflexion.                           The examined duodenum was normal. Complications:            No immediate complications. Estimated Blood Loss:     Estimated blood loss was minimal. Impression:               - Tortuous esophagus.                           - 2 cm hiatal hernia.                           - Benign-appearing esophageal stenosis. Dilated.                           - Normal stomach.                           - Normal examined duodenum.                           - No specimens collected.                           Dysphagia from a combination of hernia,tortuosity                            and  stricture. Recommendation:           - Patient has a contact number available for                            emergencies. The signs and symptoms of potential                            delayed complications were discussed with the                            patient. Return to  normal activities tomorrow.                            Written discharge instructions were provided to the                            patient.                           - Resume previous diet.                           - Resume Plavix (clopidogrel) at prior dose                            tomorrow.                           - Stop smoking                           - Continue other present medications. Leah [name] L. Loletha Carrow, MD 03/24/2021 10:51:49 AM This report has been signed electronically.

## 2021-03-24 NOTE — Progress Notes (Signed)
Called to room to assist during endoscopic procedure.  Patient ID and intended procedure confirmed with present staff. Received instructions for my participation in the procedure from the performing physician.  

## 2021-03-24 NOTE — Patient Instructions (Signed)
Please read handouts provided. Continue present medications. Resume Plavix ( clopidogrel ) at prior dose tomorrow. Stop smoking. Resume previous diet.   YOU HAD AN ENDOSCOPIC PROCEDURE TODAY AT [facility]:   Refer to the procedure report that was given to you for any specific questions about what was found during the examination.  If the procedure report does not answer your questions, please call your gastroenterologist to clarify.  If you requested that your care partner not be given the details of your procedure findings, then the procedure report has been included in a sealed envelope for you to review at your convenience later.  YOU SHOULD EXPECT: Some feelings of bloating in the abdomen. Passage of more gas than usual.  Walking can help get rid of the air that was put into your GI tract during the procedure and reduce the bloating. If you had a lower endoscopy (such as a colonoscopy or flexible sigmoidoscopy) you may notice spotting of blood in your stool or on the toilet paper. If you underwent a bowel prep for your procedure, you may not have a normal bowel movement for a few days.  Please Note:  You might notice some irritation and congestion in your nose or some drainage.  This is from the oxygen used during your procedure.  There is no need for concern and it should clear up in a day or so.  SYMPTOMS TO REPORT IMMEDIATELY:    Following upper endoscopy (EGD)  Vomiting of blood or coffee ground material  New chest pain or pain under the shoulder blades  Painful or persistently difficult swallowing  New shortness of breath  Fever of 100F or higher  Black, tarry-looking stools  For urgent or emergent issues, a gastroenterologist can be reached at any hour by calling [phone]. Do not use MyChart messaging for urgent concerns.    DIET:  We do recommend a small meal at first, but then you may proceed to your regular diet.  Drink plenty of fluids but you  should avoid alcoholic beverages for 24 hours.  ACTIVITY:  You should plan to take it easy for the rest of today and you should NOT DRIVE or use heavy machinery until tomorrow (because of the sedation medicines used during the test).    FOLLOW UP: Our staff will call the number listed on your records 48-72 hours following your procedure to check on you and address any questions or concerns that you may have regarding the information given to you following your procedure. If we do not reach you, we will leave a message.  We will attempt to reach you two times.  During this call, we will ask if you have developed any symptoms of COVID 19. If you develop any symptoms (ie: fever, flu-like symptoms, shortness of breath, cough etc.) before then, please call [phone].  If you test positive for Covid 19 in the 2 weeks post procedure, please call and report this information to [place].    If any biopsies were taken you will be contacted by phone or by letter within the next 1-3 weeks.  Please call us at [phone] if you have not heard about the biopsies in 3 weeks.    SIGNATURES/CONFIDENTIALITY: You and/or your care partner have signed paperwork which will be entered into your electronic medical record.  These signatures attest to the fact that that the information above on your After Visit Summary has been reviewed and is understood.  Full responsibility of the confidentiality  of this discharge information lies with you and/or your care-partner.  

## 2021-03-24 NOTE — Progress Notes (Signed)
No changes to clinical history since GI office visit on 02/24/21.  Subsequent abdominal CTA did not show AAA.  The patient is appropriate for an endoscopic procedure in the ambulatory setting.

## 2021-03-28 ENCOUNTER — Telehealth: Payer: Self-pay | Admitting: *Deleted

## 2021-03-28 ENCOUNTER — Telehealth: Payer: Self-pay

## 2021-03-28 NOTE — Telephone Encounter (Signed)
NO ANSWER, MESSAGE LEFT FOR PATIENT. 

## 2021-03-28 NOTE — Telephone Encounter (Signed)
Follow up call made. 

## 2021-04-02 ENCOUNTER — Ambulatory Visit: Payer: Medicare HMO

## 2021-04-05 ENCOUNTER — Other Ambulatory Visit: Payer: Self-pay | Admitting: Medical

## 2021-04-05 DIAGNOSIS — I10 Essential (primary) hypertension: Secondary | ICD-10-CM | POA: Diagnosis not present

## 2021-04-05 DIAGNOSIS — M542 Cervicalgia: Secondary | ICD-10-CM | POA: Diagnosis not present

## 2021-04-07 ENCOUNTER — Other Ambulatory Visit: Payer: Self-pay | Admitting: Family Medicine

## 2021-04-07 DIAGNOSIS — R1013 Epigastric pain: Secondary | ICD-10-CM

## 2021-04-26 DIAGNOSIS — N3001 Acute cystitis with hematuria: Secondary | ICD-10-CM | POA: Diagnosis not present

## 2021-05-05 ENCOUNTER — Other Ambulatory Visit: Payer: Self-pay

## 2021-05-05 ENCOUNTER — Encounter: Payer: Self-pay | Admitting: Internal Medicine

## 2021-05-05 ENCOUNTER — Ambulatory Visit: Payer: Medicare HMO | Admitting: Internal Medicine

## 2021-05-05 VITALS — BP 138/70 | HR 91 | Ht 61.5 in | Wt 108.2 lb

## 2021-05-05 DIAGNOSIS — I10 Essential (primary) hypertension: Secondary | ICD-10-CM | POA: Diagnosis not present

## 2021-05-05 DIAGNOSIS — I739 Peripheral vascular disease, unspecified: Secondary | ICD-10-CM

## 2021-05-05 DIAGNOSIS — Z95828 Presence of other vascular implants and grafts: Secondary | ICD-10-CM | POA: Diagnosis not present

## 2021-05-05 DIAGNOSIS — F17200 Nicotine dependence, unspecified, uncomplicated: Secondary | ICD-10-CM

## 2021-05-05 DIAGNOSIS — E7849 Other hyperlipidemia: Secondary | ICD-10-CM

## 2021-05-05 DIAGNOSIS — N3010 Interstitial cystitis (chronic) without hematuria: Secondary | ICD-10-CM | POA: Diagnosis not present

## 2021-05-05 NOTE — Patient Instructions (Signed)
Medication Instructions:  No Changes In Medications at this time. *If you need a refill on your cardiac medications before your next appointment, please call your pharmacy*  Follow-Up: At [facility], you and your health needs are our priority.  As part of our continuing mission to provide you with exceptional heart care, we have created designated Provider Care Teams.  These Care Teams include your primary Cardiologist (physician) and Advanced Practice Providers (APPs -  Physician Assistants and Nurse Practitioners) who all work together to provide you with the care you need, when you need it.  We recommend signing up for the patient portal called "MyChart".  Sign up information is provided on this After Visit Summary.  MyChart is used to connect with patients for Virtual Visits (Telemedicine).  Patients are able to view lab/test results, encounter notes, upcoming appointments, etc.  Non-urgent messages can be sent to your provider as well.   To learn more about what you can do with MyChart, go to [email or web address].    Your next appointment:   1 year(s)  The format for your next appointment:   In Person  Provider:   K. Mali Hilty, MD

## 2021-05-05 NOTE — Progress Notes (Signed)
LIPID CLINIC CONSULT NOTE  Chief Complaint:  Follow-up [place]  Primary Care Physician: Carollee Herter, Alferd Apa, DO  HPI:  Leah [name] is a 75 y.o. female who is being seen today for the evaluation of high triglycerides at the request of [name], *. This is a pleasant 75 year old female with multiple cardiovascular risk factors.  She was referred today for elevated triglycerides however also has an high LDL cholesterol.  Her most recent lipid profile demonstrates total cholesterol 223, triglycerides 182, HDL 35 and LDL of 152.  This is in the setting of treatment with high potency atorvastatin 80 mg daily and ezetimibe 10 mg daily.  The combined reduction of these medications is about 80 to 90% suggesting that her LDL is more likely 250-300.  This is a finding highly suggestive of familial hyperlipidemia.  In fact, there is evidence for familial hyperlipidemia.  Her mother had significantly elevated cholesterol.  Her father also had coronary disease and had coronary artery bypass grafting.  She has a daughter who has significant PAD and high cholesterol and she also has sister and a brother both of which you have high cholesterol and heart problems.  She has 2 grandparents on her mother side who both had heart attack.  In addition she is a smoker between 1/2 pack to a full pack per day.  She also has some history of PAD and was found to have moderate nonobstructive plaque in the left lower extremity and has been reporting some symptoms of claudication.  In addition she has known coronary disease.  She is not had a prior MI, however had a screening CT scan of the chest in July 2019 to rule out cancer given her smoking history.  This demonstrated multivessel and left main coronary artery calcification, significant aortic atherosclerosis and other findings suggestive of cardiovascular disease.  She is not on aspirin due to a history of stomach upset.  Other cardiac vascular risk factors  include hypertension on lisinopril.  Currently she reports dyspnea on exertion but denies chest pain.  She also has had some fatigue.  10/17/2019  Ms. Roberg returns today for follow-up.  Her lipids have declined significantly.  Most recently her total cholesterol was 75, triglycerides 136, HDL 46 and LDL of 2.  This is similar to her numbers about 9 months ago.  She has not had a dose adjustment in her medications.  By convention we tend to not try to overtreat her with a extremely low LDL cholesterol.  She still should be on a high potency statin and therefore would recommend decreasing her Lipitor from 80 to 40 mg daily.  Ultimately we may be able to discontinue her ezetimibe due to excellent response to the Repatha.  05/05/2021  Ms. Obando returns today for follow-up.  Overall she seems to be doing very well on a combination of atorvastatin, ezetimibe and Repatha.  Her recent cholesterol in May showed total 134, HDL 46, LDL 64 triglycerides 137.  She denies any symptomatic claudication.  Unfortunately she continues to smoke but is try to cut back.  She is currently about 1/2 pack/day smoker.  PMHx:  Past Medical History:  Diagnosis Date   Allergy    Anemia    Anxiety    Barrett esophagus    Bladder pain    Cataract    Chronic bronchitis ([place])    per pt last episode july 2016   Complication of anesthesia    slow to wake  COPD (chronic obstructive pulmonary disease) (HCC)    Depression    Diverticulosis    Frequency of urination    GERD (gastroesophageal reflux disease)    Hiatal hernia    History of adenomatous polyp of colon    tubular adenoma   History of chronic gastritis    History of diverticulitis of colon    History of esophageal dilatation    for stricture   History of esophageal spasm    Hyperlipidemia    Hypertension    IBS (irritable bowel syndrome)    Interstitial cystitis    PAD (peripheral artery disease) (HCC)    Productive cough    intermittant    Smokers' cough ([place])    Urgency of urination     Past Surgical History:  Procedure Laterality Date   ABDOMINAL AORTOGRAM N/A 10/14/2018   Procedure: ABDOMINAL AORTOGRAM;  Surgeon: Lorretta Harp, MD;  Location: [facility];  Service: Cardiovascular;  Laterality: N/A;   ABDOMINAL HYSTERECTOMY  1975   ANTERIOR CERVICAL DECOMP/DISCECTOMY FUSION N/A 09/07/2014   Procedure: Exploration of Fusion and Removal of Anterior Cervical Hematoma;  Surgeon: Newman Pies, MD;  Location: [facility];  Service: Neurosurgery;  Laterality: N/A;   CARPAL TUNNEL RELEASE Right 1993   COLONOSCOPY  last one 04-30-2013   COLONOSCOPY WITH PROPOFOL N/A 09/09/2019   Procedure: COLONOSCOPY WITH PROPOFOL;  Surgeon: Doran Stabler, MD;  Location: [facility];  Service: Gastroenterology;  Laterality: N/A;   CYSTO WITH HYDRODISTENSION N/A 06/08/2015   Procedure: CYSTOSCOPY/HYDRODISTENSION INSTILLATION OF MARCAINE AND PYRIDIUM;  Surgeon: Irine Seal, MD;  Location: [facility];  Service: Urology;  Laterality: N/A;   CYSTO/  HOD/  BLADDER BX  1990's   ESOPHAGOGASTRODUODENOSCOPY  last one 09-11-2012   LAPAROSCOPIC APPENDECTOMY N/A 09/21/2013   Procedure: APPENDECTOMY LAPAROSCOPIC;  Surgeon: Rolm Bookbinder, MD;  Location: [facility];  Service: General;  Laterality: N/A;   LAPAROSCOPIC CHOLECYSTECTOMY  2002   LAPAROSCOPY SIGMOID COLECTOMY  10-05-2008   diverticulitis   LOWER EXTREMITY ANGIOGRAPHY Left 10/14/2018   LOWER EXTREMITY ANGIOGRAPHY Bilateral 10/14/2018   Procedure: Lower Extremity Angiography;  Surgeon: Lorretta Harp, MD;  Location: [facility];  Service: Cardiovascular;  Laterality: Bilateral;  ILIACS   MICROLARYNGOSCOPY  09-27-2006   w/ true vocal cord stripping  and bilateral bx's of lesion's (benign)   PERIPHERAL VASCULAR INTERVENTION  10/14/2018   Procedure: PERIPHERAL VASCULAR INTERVENTION;  Surgeon: Lorretta Harp, MD;  Location: [facility];  Service:  Cardiovascular;;   SINUS SURGERY WITH INSTATRAK  1991   TEMPOROMANDIBULAR JOINT SURGERY  1990   TONSILLECTOMY AND ADENOIDECTOMY  1964    FAMHx:  Family History  Problem Relation Age of Onset   Pancreatic cancer Mother    Colon polyps Mother    Pancreatic cancer Father    Diabetes Father    Heart disease Father    Leukemia Father    Prostate cancer Father    Hypertension Father    Colon cancer Neg Hx    Esophageal cancer Neg Hx    Rectal cancer Neg Hx    Stomach cancer Neg Hx     SOCHx:   reports that she has been smoking cigarettes. She has a 35.25 pack-year smoking history. She has never used smokeless tobacco. She reports that she does not drink alcohol and does not use drugs.  ALLERGIES:  Allergies  Allergen Reactions   Amoxicillin-Pot Clavulanate Shortness Of Breath, Itching and Swelling   Avelox [  Moxifloxacin Hcl In Nacl] Swelling    Swelling, itching and shortness of breath   Ciprofloxacin Shortness Of Breath, Itching and Swelling   Keflex [Cephalexin] Shortness Of Breath and Swelling   Penicillins Shortness Of Breath, Itching and Swelling    Did it involve swelling of the face/tongue/throat, SOB, or low BP? Yes Did it involve sudden or severe rash/hives, skin peeling, or any reaction on the inside of your mouth or nose? No Did you need to seek medical attention at a hospital or doctor's office? Yes When did it last happen?      Several years If all above answers are "NO", may proceed with cephalosporin use.    Sulfa Antibiotics Shortness Of Breath, Itching and Swelling   Prednisone Other (See Comments)    GI irritation; is able to tolerate injections   Acyclovir And Related Other (See Comments)    Pt does not recall this reaction   Clindamycin/Lincomycin Itching and Swelling   Levaquin [Levofloxacin] Hives   Zyvox [Linezolid] Diarrhea    ROS: Pertinent items noted in HPI and remainder of comprehensive ROS otherwise negative.  HOME MEDS: Current Outpatient  Medications on File Prior to Visit  Medication Sig Dispense Refill   aspirin EC 81 MG EC tablet Take 1 tablet (81 mg total) by mouth daily.     atorvastatin (LIPITOR) 40 MG tablet TAKE 1 TABLET EVERY DAY (NEED MD APPOINTMENT FOR REFILLS) 90 tablet 0   clopidogrel (PLAVIX) 75 MG tablet TAKE 1 TABLET EVERY DAY 90 tablet 2   dicyclomine (BENTYL) 10 MG capsule TAKE 1 CAPSULE EVERY DAY AS NEEDED  FOR ABDOMINAL SPASMS 90 capsule 0   estradiol (CLIMARA - DOSED IN MG/24 HR) 0.1 mg/24hr patch Place 0.1 mg onto the skin every Monday.  0   ezetimibe (ZETIA) 10 MG tablet Take 1 tablet (10 mg total) by mouth daily. 90 tablet 1   famotidine (PEPCID) 20 MG tablet TAKE 1 TABLET TWICE DAILY 180 tablet 3   lisinopril (ZESTRIL) 20 MG tablet TAKE 1 TABLET EVERY DAY 90 tablet 1   LORazepam (ATIVAN) 0.5 MG tablet Take 0.5 mg by mouth at bedtime.   5   pantoprazole (PROTONIX) 40 MG tablet TAKE 1 TABLET BY MOUTH EVERY DAY 90 tablet 1   polyethylene glycol (MIRALAX) 17 g packet Take 17 g by mouth 2 (two) times daily. 30 each 0   REPATHA SURECLICK 818 MG/ML SOAJ INJECT 1 DOSE INTO THE SKIN EVERY 14 (FOURTEEN) DAYS. 2 mL 11   sertraline (ZOLOFT) 50 MG tablet Take 25 mg by mouth at bedtime.      sucralfate (CARAFATE) 1 g tablet TAKE 1 TABLET BY MOUTH THREE TIMES A DAY 270 tablet 1   No current facility-administered medications on file prior to visit.    LABS/IMAGING: No results found for this or any previous visit (from the past 48 hour(s)). No results found.  LIPID PANEL:    Component Value Date/Time   CHOL 134 12/09/2020 1120   TRIG 137 12/09/2020 1120   HDL 46 12/09/2020 1120   CHOLHDL 2.9 12/09/2020 1120   CHOLHDL 3.1 04/01/2020 1130   VLDL 27.2 09/19/2019 1157   LDLCALC 64 12/09/2020 1120   LDLCALC 76 04/01/2020 1130   LDLDIRECT 151.0 11/07/2016 1647    WEIGHTS: Wt Readings from Last 3 Encounters:  05/05/21 108 lb 3.2 oz (49.1 kg)  03/24/21 109 lb (49.4 kg)  02/24/21 109 lb (49.4 kg)     VITALS: BP 138/70   Pulse 91  Ht 5' 1.5" (1.562 m)   Wt 108 lb 3.2 oz (49.1 kg)   SpO2 91%   BMI 20.11 kg/m   EXAM: [place]  EKG: Deferred  ASSESSMENT: Definite HeFH - Dutch score of 9 Multiple vessel coronary artery calcification/aortic atherosclerosis PAD with left lower extremity claudication Ongoing tobacco abuse Hypertension  PLAN: 1.   Mrs. Topor has had significant reduction in her lipids and most likely has FH with LDL cholesterol well over 200 prior to starting therapy.  She is now on high intensity statin, ezetimibe and Repatha and has achieved an LDL cholesterol below 70.  She will need to continue with this.  Unfortunately she continues to smoke and she says she has issues with anxiety which drives that.  She is trying to cut back but is not ready to quit at this time.  She will need reauthorization of her Repatha as she has only 1 shot left.  We will take care of that for her.  Follow-up with me annually or sooner as necessary.  Pixie Casino, MD, [facility], [place] Director of the [facility] Lipid Disorders &  Cardiovascular Risk Reduction Clinic Diplomate of the American Board of Clinical Lipidology Attending Cardiologist  Direct Dial: [phone]  Fax: [phone]  Website:  [email or web address]  Nadean Corwin Celester Morgan 05/05/2021, 9:30 AM

## 2021-05-10 ENCOUNTER — Other Ambulatory Visit: Payer: Self-pay | Admitting: Internal Medicine

## 2021-05-10 MED ORDER — REPATHA SURECLICK 140 MG/ML ~~LOC~~ SOAJ: SUBCUTANEOUS | 11 refills | Status: DC

## 2021-05-27 ENCOUNTER — Ambulatory Visit (INDEPENDENT_AMBULATORY_CARE_PROVIDER_SITE_OTHER): Payer: Medicare HMO

## 2021-05-27 ENCOUNTER — Other Ambulatory Visit: Payer: Self-pay

## 2021-05-27 ENCOUNTER — Telehealth: Payer: Self-pay

## 2021-05-27 ENCOUNTER — Encounter: Payer: Self-pay | Admitting: Family Medicine

## 2021-05-27 DIAGNOSIS — Z23 Encounter for immunization: Secondary | ICD-10-CM

## 2021-05-27 NOTE — Telephone Encounter (Signed)
Pt came into the office to get a flu shot. She then inquired when she is due for her next Pneumonia shot.  Here Immunizations per the chart is:   Fluad Quad(high Dose 65+)05/27/2021, 05/28/2020, 06/13/2019 Influenza, High Dose Seasonal PF12/08/2017, 06/06/2017, 07/10/2016 Influenza,inj,Quad PF,6+ Mos9/27/2016, 06/08/2014 Pneumococcal Conjugate-135/16/2019, 06/20/2013 Pneumococcal Polysaccharide-235/13/2009 Tdap9/09/2017  Please assist.

## 2021-05-30 NOTE — Telephone Encounter (Signed)
Patient over due for f/u with PCP, scheduled for next week.

## 2021-06-07 ENCOUNTER — Encounter: Payer: Self-pay | Admitting: Family Medicine

## 2021-06-07 ENCOUNTER — Other Ambulatory Visit: Payer: Self-pay

## 2021-06-07 ENCOUNTER — Ambulatory Visit (INDEPENDENT_AMBULATORY_CARE_PROVIDER_SITE_OTHER): Payer: Medicare HMO | Admitting: Family Medicine

## 2021-06-07 VITALS — BP 130/80 | HR 72 | Temp 98.0°F | Resp 20 | Ht 61.5 in | Wt 106.6 lb

## 2021-06-07 DIAGNOSIS — I10 Essential (primary) hypertension: Secondary | ICD-10-CM

## 2021-06-07 DIAGNOSIS — E785 Hyperlipidemia, unspecified: Secondary | ICD-10-CM

## 2021-06-07 DIAGNOSIS — J449 Chronic obstructive pulmonary disease, unspecified: Secondary | ICD-10-CM | POA: Diagnosis not present

## 2021-06-07 DIAGNOSIS — R5383 Other fatigue: Secondary | ICD-10-CM

## 2021-06-07 DIAGNOSIS — Z23 Encounter for immunization: Secondary | ICD-10-CM

## 2021-06-07 LAB — LIPID PANEL
Cholesterol: 122 mg/dL (ref 0–200)
HDL: 39.3 mg/dL (ref >39.00)
LDL Cholesterol: 57 mg/dL (ref 0–99)
NonHDL: 82.71
Total CHOL/HDL Ratio: 3
Triglycerides: 127 mg/dL (ref 0.0–149.0)
VLDL: 25.4 mg/dL (ref 0.0–40.0)

## 2021-06-07 LAB — COMPREHENSIVE METABOLIC PANEL
ALT: 7 U/L (ref 0–35)
AST: 13 U/L (ref 0–37)
Albumin: 3.8 g/dL (ref 3.5–5.2)
Alkaline Phosphatase: 57 U/L (ref 39–117)
BUN: 10 mg/dL (ref 6–23)
CO2: 32 mEq/L (ref 19–32)
Calcium: 8.5 mg/dL (ref 8.4–10.5)
Chloride: 104 mEq/L (ref 96–112)
Creatinine, Ser: 0.77 mg/dL (ref 0.40–1.20)
GFR: 76.75 mL/min (ref >60.00)
Glucose, Bld: 89 mg/dL (ref 70–99)
Potassium: 4.3 mEq/L (ref 3.5–5.1)
Sodium: 140 mEq/L (ref 135–145)
Total Bilirubin: 0.3 mg/dL (ref 0.2–1.2)
Total Protein: 5.9 g/dL — ABNORMAL LOW (ref 6.0–8.3)

## 2021-06-07 LAB — IBC PANEL
Iron: 64 ug/dL (ref 42–145)
Saturation Ratios: 22.2 % (ref 20.0–50.0)
TIBC: 288.4 ug/dL (ref 250.0–450.0)
Transferrin: 206 mg/dL — ABNORMAL LOW (ref 212.0–360.0)

## 2021-06-07 LAB — CBC WITH DIFFERENTIAL/PLATELET
Basophils Absolute: 0.1 10*3/uL (ref 0.0–0.1)
Basophils Relative: 1.2 % (ref 0.0–3.0)
Eosinophils Absolute: 0.2 10*3/uL (ref 0.0–0.7)
Eosinophils Relative: 4.8 % (ref 0.0–5.0)
HCT: 40.8 % (ref 36.0–46.0)
Hemoglobin: 13.2 g/dL (ref 12.0–15.0)
Lymphocytes Relative: 27.4 % (ref 12.0–46.0)
Lymphs Abs: 1.3 10*3/uL (ref 0.7–4.0)
MCHC: 32.4 g/dL (ref 30.0–36.0)
MCV: 92.4 fl (ref 78.0–100.0)
Monocytes Absolute: 0.4 10*3/uL (ref 0.1–1.0)
Monocytes Relative: 7.4 % (ref 3.0–12.0)
Neutro Abs: 2.8 10*3/uL (ref 1.4–7.7)
Neutrophils Relative %: 59.2 % (ref 43.0–77.0)
Platelets: 204 10*3/uL (ref 150.0–400.0)
RBC: 4.41 Mil/uL (ref 3.87–5.11)
RDW: 14.3 % (ref 11.5–15.5)
WBC: 4.8 10*3/uL (ref 4.0–10.5)

## 2021-06-07 LAB — VITAMIN B12: Vitamin B-12: 310 pg/mL (ref 211–911)

## 2021-06-07 LAB — TSH: TSH: 2.07 u[IU]/mL (ref 0.35–5.50)

## 2021-06-07 NOTE — Assessment & Plan Note (Signed)
Encourage heart healthy diet such as MIND or DASH diet, increase exercise, avoid trans fats, simple carbohydrates and processed foods, consider a krill or fish or flaxseed oil cap daily.  Check labs today 

## 2021-06-07 NOTE — Progress Notes (Signed)
Subjective:   By signing my name below, I, [name], attest that this documentation has been prepared under the direction and in the presence of Dr. Roma Schanz, DO. 06/07/2021      Patient ID: Leah [name], female    DOB: 03/31/1946, 75 y.o.   MRN: [id number]  Chief Complaint  Patient presents with   Hypertension   Hyperlipidemia   Follow-up    HPI Patient is in today for a office visit.   Fatigue- She reports feeling tired frequently and is requesting to check her iron levels during her next lab visit.  Repatha- She continues taking Repatha every 2 weeks and reports no new issues while taking it.  Breathing- Her breathing is doing well at this time.   Immunizations- She received her flu vaccine during this visit. She does not have any Covid-19 vaccine. She is due for pneumonia vaccines and is interested in receiving it.    Past Medical History:  Diagnosis Date   Allergy    Anemia    Anxiety    Barrett esophagus    Bladder pain    Cataract    Chronic bronchitis ([place])    per pt last episode july 2016   Complication of anesthesia    slow to wake   COPD (chronic obstructive pulmonary disease) (HCC)    Depression    Diverticulosis    Frequency of urination    GERD (gastroesophageal reflux disease)    Hiatal hernia    History of adenomatous polyp of colon    tubular adenoma   History of chronic gastritis    History of diverticulitis of colon    History of esophageal dilatation    for stricture   History of esophageal spasm    Hyperlipidemia    Hypertension    IBS (irritable bowel syndrome)    Interstitial cystitis    PAD (peripheral artery disease) (HCC)    Productive cough    intermittant   Smokers' cough ([place])    Urgency of urination     Past Surgical History:  Procedure Laterality Date   ABDOMINAL AORTOGRAM N/A 10/14/2018   Procedure: ABDOMINAL AORTOGRAM;  Surgeon: Lorretta Harp, MD;  Location: [facility];  Service:  Cardiovascular;  Laterality: N/A;   ABDOMINAL HYSTERECTOMY  1975   ANTERIOR CERVICAL DECOMP/DISCECTOMY FUSION N/A 09/07/2014   Procedure: Exploration of Fusion and Removal of Anterior Cervical Hematoma;  Surgeon: Newman Pies, MD;  Location: [facility];  Service: Neurosurgery;  Laterality: N/A;   CARPAL TUNNEL RELEASE Right 1993   COLONOSCOPY  last one 04-30-2013   COLONOSCOPY WITH PROPOFOL N/A 09/09/2019   Procedure: COLONOSCOPY WITH PROPOFOL;  Surgeon: Doran Stabler, MD;  Location: [facility];  Service: Gastroenterology;  Laterality: N/A;   CYSTO WITH HYDRODISTENSION N/A 06/08/2015   Procedure: CYSTOSCOPY/HYDRODISTENSION INSTILLATION OF MARCAINE AND PYRIDIUM;  Surgeon: Irine Seal, MD;  Location: [facility];  Service: Urology;  Laterality: N/A;   CYSTO/  HOD/  BLADDER BX  1990's   ESOPHAGOGASTRODUODENOSCOPY  last one 09-11-2012   LAPAROSCOPIC APPENDECTOMY N/A 09/21/2013   Procedure: APPENDECTOMY LAPAROSCOPIC;  Surgeon: Rolm Bookbinder, MD;  Location: [facility];  Service: General;  Laterality: N/A;   LAPAROSCOPIC CHOLECYSTECTOMY  2002   LAPAROSCOPY SIGMOID COLECTOMY  10-05-2008   diverticulitis   LOWER EXTREMITY ANGIOGRAPHY Left 10/14/2018   LOWER EXTREMITY ANGIOGRAPHY Bilateral 10/14/2018   Procedure: Lower Extremity Angiography;  Surgeon: Lorretta Harp, MD;  Location: [facility];  Service: Cardiovascular;  Laterality: Bilateral;  ILIACS   MICROLARYNGOSCOPY  09-27-2006   w/ true vocal cord stripping  and bilateral bx's of lesion's (benign)   PERIPHERAL VASCULAR INTERVENTION  10/14/2018   Procedure: PERIPHERAL VASCULAR INTERVENTION;  Surgeon: Lorretta Harp, MD;  Location: [facility];  Service: Cardiovascular;;   SINUS SURGERY WITH INSTATRAK  1991   TEMPOROMANDIBULAR JOINT SURGERY  1990   TONSILLECTOMY AND ADENOIDECTOMY  1964    Family History  Problem Relation Age of Onset   Pancreatic cancer Mother    Colon polyps Mother    Pancreatic cancer  Father    Diabetes Father    Heart disease Father    Leukemia Father    Prostate cancer Father    Hypertension Father    Colon cancer Neg Hx    Esophageal cancer Neg Hx    Rectal cancer Neg Hx    Stomach cancer Neg Hx     Social History   Socioeconomic History   Marital status: Married    Spouse name: Not on file   Number of children: Not on file   Years of education: Not on file   Highest education level: Not on file  Occupational History   Not on file  Tobacco Use   Smoking status: Every Day    Packs/day: 0.75    Years: 47.00    Pack years: 35.25    Types: Cigarettes   Smokeless tobacco: Never   Tobacco comments:    counseled about smoking cessation  Vaping Use   Vaping Use: Never used  Substance and Sexual Activity   Alcohol use: No    Alcohol/week: 0.0 standard drinks   Drug use: No   Sexual activity: Not Currently    Birth control/protection: Post-menopausal  Other Topics Concern   Not on file  Social History Narrative   Not on file   Social Determinants of Health   Financial Resource Strain: Not on file  Food Insecurity: Not on file  Transportation Needs: Not on file  Physical Activity: Not on file  Stress: Not on file  Social Connections: Not on file  Intimate Partner Violence: Not on file    Outpatient Medications Prior to Visit  Medication Sig Dispense Refill   aspirin EC 81 MG EC tablet Take 1 tablet (81 mg total) by mouth daily.     atorvastatin (LIPITOR) 40 MG tablet TAKE 1 TABLET EVERY DAY (NEED MD APPOINTMENT FOR REFILLS) 90 tablet 0   clopidogrel (PLAVIX) 75 MG tablet TAKE 1 TABLET EVERY DAY 90 tablet 2   dicyclomine (BENTYL) 10 MG capsule TAKE 1 CAPSULE EVERY DAY AS NEEDED  FOR ABDOMINAL SPASMS 90 capsule 0   estradiol (CLIMARA - DOSED IN MG/24 HR) 0.1 mg/24hr patch Place 0.1 mg onto the skin every Monday.  0   Evolocumab (REPATHA SURECLICK) 956 MG/ML SOAJ INJECT 1 DOSE INTO THE SKIN EVERY 14 (FOURTEEN) DAYS. 2 mL 11   ezetimibe (ZETIA)  10 MG tablet Take 1 tablet (10 mg total) by mouth daily. 90 tablet 1   famotidine (PEPCID) 20 MG tablet TAKE 1 TABLET TWICE DAILY 180 tablet 3   lisinopril (ZESTRIL) 20 MG tablet TAKE 1 TABLET EVERY DAY 90 tablet 1   LORazepam (ATIVAN) 0.5 MG tablet Take 0.5 mg by mouth at bedtime.   5   pantoprazole (PROTONIX) 40 MG tablet TAKE 1 TABLET BY MOUTH EVERY DAY 90 tablet 1   polyethylene glycol (MIRALAX) 17 g packet Take 17 g by mouth  2 (two) times daily. 30 each 0   sertraline (ZOLOFT) 50 MG tablet Take 25 mg by mouth at bedtime.      sucralfate (CARAFATE) 1 g tablet TAKE 1 TABLET BY MOUTH THREE TIMES A DAY 270 tablet 1   No facility-administered medications prior to visit.    Allergies  Allergen Reactions   Amoxicillin-Pot Clavulanate Shortness Of Breath, Itching and Swelling   Avelox [Moxifloxacin Hcl In Nacl] Swelling    Swelling, itching and shortness of breath   Ciprofloxacin Shortness Of Breath, Itching and Swelling   Keflex [Cephalexin] Shortness Of Breath and Swelling   Penicillins Shortness Of Breath, Itching and Swelling    Did it involve swelling of the face/tongue/throat, SOB, or low BP? Yes Did it involve sudden or severe rash/hives, skin peeling, or any reaction on the inside of your mouth or nose? No Did you need to seek medical attention at a hospital or doctor's office? Yes When did it last happen?      Several years If all above answers are "NO", may proceed with cephalosporin use.    Sulfa Antibiotics Shortness Of Breath, Itching and Swelling   Prednisone Other (See Comments)    GI irritation; is able to tolerate injections   Acyclovir And Related Other (See Comments)    Pt does not recall this reaction   Clindamycin/Lincomycin Itching and Swelling   Levaquin [Levofloxacin] Hives   Zyvox [Linezolid] Diarrhea    Review of Systems  Constitutional:  Positive for malaise/fatigue. Negative for fever.  HENT:  Negative for congestion.   Eyes:  Negative for blurred  vision.  Respiratory:  Negative for shortness of breath.   Cardiovascular:  Negative for chest pain, palpitations and leg swelling.  Gastrointestinal:  Negative for abdominal pain, blood in stool and nausea.  Genitourinary:  Negative for dysuria and frequency.  Musculoskeletal:  Negative for falls.  Skin:  Negative for rash.  Neurological:  Negative for dizziness, loss of consciousness and headaches.  Endo/Heme/Allergies:  Negative for environmental allergies.  Psychiatric/Behavioral:  Negative for depression. The patient is not nervous/anxious.       Objective:    Physical Exam Vitals and nursing note reviewed.  Constitutional:      General: She is not in acute distress.    Appearance: Normal appearance. She is not ill-appearing.  HENT:     Head: Normocephalic and atraumatic.     Right Ear: Tympanic membrane, ear canal and external ear normal.     Left Ear: Tympanic membrane, ear canal and external ear normal.  Eyes:     Extraocular Movements: Extraocular movements intact.     Pupils: Pupils are equal, round, and reactive to light.  Cardiovascular:     Rate and Rhythm: Normal rate and regular rhythm.     Heart sounds: Normal heart sounds. No murmur heard.   No gallop.  Pulmonary:     Effort: Pulmonary effort is normal. No respiratory distress.     Breath sounds: Normal breath sounds. No wheezing or rales.  Abdominal:     General: Bowel sounds are normal. There is no distension.     Palpations: Abdomen is soft.     Tenderness: There is no abdominal tenderness. There is no guarding.  Skin:    General: Skin is warm and dry.  Neurological:     Mental Status: She is alert and oriented to person, place, and time.  Psychiatric:        Behavior: Behavior normal.    BP 130/80 (BP  Location: Left Arm, Patient Position: Sitting, Cuff Size: Normal)   Pulse 72   Temp 98 F (36.7 C) (Oral)   Resp 20   Ht 5' 1.5" (1.562 m)   Wt 106 lb 9.6 oz (48.4 kg)   SpO2 96%   BMI 19.82  kg/m  Wt Readings from Last 3 Encounters:  06/07/21 106 lb 9.6 oz (48.4 kg)  05/05/21 108 lb 3.2 oz (49.1 kg)  03/24/21 109 lb (49.4 kg)    Diabetic Foot Exam - Simple   No data filed    Lab Results  Component Value Date   WBC 6.9 01/25/2021   HGB 14.1 01/25/2021   HCT 41.8 01/25/2021   PLT 221.0 01/25/2021   GLUCOSE 102 (H) 01/25/2021   CHOL 134 12/09/2020   TRIG 137 12/09/2020   HDL 46 12/09/2020   LDLDIRECT 151.0 11/07/2016   LDLCALC 64 12/09/2020   ALT 8 01/25/2021   AST 13 01/25/2021   NA 138 01/25/2021   K 4.0 01/25/2021   CL 99 01/25/2021   CREATININE 0.90 03/04/2021   BUN 11 01/25/2021   CO2 31 01/25/2021   TSH 2.47 09/07/2020   INR 1.0 05/21/2009   HGBA1C 6.1 12/18/2017   MICROALBUR <0.7 11/09/2014    Lab Results  Component Value Date   TSH 2.47 09/07/2020   Lab Results  Component Value Date   WBC 6.9 01/25/2021   HGB 14.1 01/25/2021   HCT 41.8 01/25/2021   MCV 92.0 01/25/2021   PLT 221.0 01/25/2021   Lab Results  Component Value Date   NA 138 01/25/2021   K 4.0 01/25/2021   CO2 31 01/25/2021   GLUCOSE 102 (H) 01/25/2021   BUN 11 01/25/2021   CREATININE 0.90 03/04/2021   BILITOT 0.3 01/25/2021   ALKPHOS 61 01/25/2021   AST 13 01/25/2021   ALT 8 01/25/2021   PROT 7.0 01/25/2021   ALBUMIN 4.4 01/25/2021   CALCIUM 9.4 01/25/2021   ANIONGAP 5 09/08/2019   GFR 64.67 01/25/2021   Lab Results  Component Value Date   CHOL 134 12/09/2020   Lab Results  Component Value Date   HDL 46 12/09/2020   Lab Results  Component Value Date   LDLCALC 64 12/09/2020   Lab Results  Component Value Date   TRIG 137 12/09/2020   Lab Results  Component Value Date   CHOLHDL 2.9 12/09/2020   Lab Results  Component Value Date   HGBA1C 6.1 12/18/2017   Mammogram- Last completed 11/08/2020. Results are normal. Repeat in 1 year. Last completed 12/23/2014.      Assessment & Plan:   Problem List Items Addressed This Visit       Unprioritized    Fatigue   Relevant Orders   CBC with Differential/Platelet   Vitamin B12   TSH   IBC panel   Dyslipidemia    Encourage heart healthy diet such as MIND or DASH diet, increase exercise, avoid trans fats, simple carbohydrates and processed foods, consider a krill or fish or flaxseed oil cap daily.  Check labs today       HTN (hypertension) - Primary    Well controlled, no changes to meds. Encouraged heart healthy diet such as the DASH diet and exercise as tolerated.       Relevant Orders   Comprehensive metabolic panel   Lipid panel   Other Visit Diagnoses     Hyperlipidemia, unspecified hyperlipidemia type       Relevant Orders   Comprehensive metabolic panel  Lipid panel   Need for pneumococcal vaccination       Relevant Orders   Pneumococcal conjugate vaccine 20-valent (Prevnar 20) (Completed)        No orders of the defined types were placed in this encounter.   I, Dr. Roma Schanz, DO, personally preformed the services described in this documentation.  All medical record entries made by the scribe were at my direction and in my presence.  I have reviewed the chart and discharge instructions (if applicable) and agree that the record reflects my personal performance and is accurate and complete. 06/07/2021   I,[name],acting as a scribe for [name], DO.,have documented all relevant documentation on the behalf of [name], DO,as directed by  [name], DO while in the presence of [name], DO.   [name], DO

## 2021-06-07 NOTE — Patient Instructions (Signed)
Cholesterol Content in Foods ?Cholesterol is a waxy, fat-like substance that helps to carry fat in the blood. The body needs cholesterol in small amounts, but too much cholesterol can cause damage to the arteries and heart. ?What foods have cholesterol? ?Cholesterol is found in animal-based foods, such as meat, seafood, and dairy. Generally, low-fat dairy and lean meats have less cholesterol than full-fat dairy and fatty meats. The milligrams of cholesterol per serving (mg per serving) of common cholesterol-containing foods are listed below. ?Meats and other proteins ?Egg -- one large whole egg has 186 mg. ?Veal shank -- 4 oz (113 g) has 141 mg. ?Lean ground turkey (93% lean) -- 4 oz (113 g) has 118 mg. ?Fat-trimmed lamb loin -- 4 oz (113 g) has 106 mg. ?Lean ground beef (90% lean) -- 4 oz (113 g) has 100 mg. ?Lobster -- 3.5 oz (99 g) has 90 mg. ?Pork loin chops -- 4 oz (113 g) has 86 mg. ?Canned salmon -- 3.5 oz (99 g) has 83 mg. ?Fat-trimmed beef top loin -- 4 oz (113 g) has 78 mg. ?Frankfurter -- 1 frank (3.5 oz or 99 g) has 77 mg. ?Crab -- 3.5 oz (99 g) has 71 mg. ?Roasted chicken without skin, white meat -- 4 oz (113 g) has 66 mg. ?Light bologna -- 2 oz (57 g) has 45 mg. ?Deli-cut turkey -- 2 oz (57 g) has 31 mg. ?Canned tuna -- 3.5 oz (99 g) has 31 mg. ?Bacon -- 1 oz (28 g) has 29 mg. ?Oysters and mussels (raw) -- 3.5 oz (99 g) has 25 mg. ?Mackerel -- 1 oz (28 g) has 22 mg. ?Trout -- 1 oz (28 g) has 20 mg. ?Pork sausage -- 1 link (1 oz or 28 g) has 17 mg. ?Salmon -- 1 oz (28 g) has 16 mg. ?Tilapia -- 1 oz (28 g) has 14 mg. ?Dairy ?Soft-serve ice cream -- ? cup (4 oz or 86 g) has 103 mg. ?Whole-milk yogurt -- 1 cup (8 oz or 245 g) has 29 mg. ?Cheddar cheese -- 1 oz (28 g) has 28 mg. ?American cheese -- 1 oz (28 g) has 28 mg. ?Whole milk -- 1 cup (8 oz or 250 mL) has 23 mg. ?2% milk -- 1 cup (8 oz or 250 mL) has 18 mg. ?Cream cheese -- 1 tablespoon (Tbsp) (14.5 g) has 15 mg. ?Cottage cheese -- ? cup (4 oz or 113  g) has 14 mg. ?Low-fat (1%) milk -- 1 cup (8 oz or 250 mL) has 10 mg. ?Sour cream -- 1 Tbsp (12 g) has 8.5 mg. ?Low-fat yogurt -- 1 cup (8 oz or 245 g) has 8 mg. ?Nonfat Greek yogurt -- 1 cup (8 oz or 228 g) has 7 mg. ?Half-and-half cream -- 1 Tbsp (15 mL) has 5 mg. ?Fats and oils ?Cod liver oil -- 1 tablespoon (Tbsp) (13.6 g) has 82 mg. ?Butter -- 1 Tbsp (14 g) has 15 mg. ?Lard -- 1 Tbsp (12.8 g) has 14 mg. ?Bacon grease -- 1 Tbsp (12.9 g) has 14 mg. ?Mayonnaise -- 1 Tbsp (13.8 g) has 5-10 mg. ?Margarine -- 1 Tbsp (14 g) has 3-10 mg. ?The items listed above may not be a complete list of foods with cholesterol. Exact amounts of cholesterol in these foods may vary depending on specific ingredients and brands. Contact a dietitian for more information. ?What foods do not have cholesterol? ?Most plant-based foods do not have cholesterol unless you combine them with a food that has cholesterol.   Foods without cholesterol include: ?Grains and cereals. ?Vegetables. ?Fruits. ?Vegetable oils, such as olive, canola, and sunflower oil. ?Legumes, such as peas, beans, and lentils. ?Nuts and seeds. ?Egg whites. ?The items listed above may not be a complete list of foods that do not have cholesterol. Contact a dietitian for more information. ?Summary ?The body needs cholesterol in small amounts, but too much cholesterol can cause damage to the arteries and heart. ?Cholesterol is found in animal-based foods, such as meat, seafood, and dairy. Generally, low-fat dairy and lean meats have less cholesterol than full-fat dairy and fatty meats. ?This information is not intended to replace advice given to you by your health care provider. Make sure you discuss any questions you have with your health care provider. ?Document Revised: 11/26/2020 Document Reviewed: 11/26/2020 ?Elsevier Patient Education ? 2022 [employer] ? ?

## 2021-06-07 NOTE — Assessment & Plan Note (Signed)
Well controlled, no changes to meds. Encouraged heart healthy diet such as the DASH diet and exercise as tolerated.  °

## 2021-06-28 ENCOUNTER — Telehealth (INDEPENDENT_AMBULATORY_CARE_PROVIDER_SITE_OTHER): Payer: Medicare HMO | Admitting: Family Medicine

## 2021-06-28 ENCOUNTER — Other Ambulatory Visit: Payer: Self-pay

## 2021-06-28 ENCOUNTER — Encounter: Payer: Self-pay | Admitting: Family Medicine

## 2021-06-28 DIAGNOSIS — J0140 Acute pansinusitis, unspecified: Secondary | ICD-10-CM | POA: Diagnosis not present

## 2021-06-28 DIAGNOSIS — R051 Acute cough: Secondary | ICD-10-CM

## 2021-06-28 MED ORDER — FLUTICASONE PROPIONATE 50 MCG/ACT NA SUSP: 2.0000 | Freq: Every day | NASAL | 6 refills | Status: DC

## 2021-06-28 MED ORDER — PROMETHAZINE-DM 6.25-15 MG/5ML PO SYRP
5.0000 mL | ORAL_SOLUTION | Freq: Four times a day (QID) | ORAL | 0 refills | Status: DC | PRN
Start: 2021-06-28 — End: 2023-03-06

## 2021-06-28 MED ORDER — DOXYCYCLINE HYCLATE 100 MG PO TABS: 100.0000 mg | ORAL_TABLET | Freq: Two times a day (BID) | ORAL | 0 refills | Status: DC

## 2021-06-28 NOTE — Progress Notes (Signed)
Virtual telephone visit    Virtual Visit via Telephone Note   This visit type was conducted due to national recommendations for restrictions regarding the COVID-19 Pandemic (e.g. social distancing) in an effort to limit this patient's exposure and mitigate transmission in our community. Due to her co-morbid illnesses, this patient is at least at moderate risk for complications without adequate follow up. This format is felt to be most appropriate for this patient at this time. The patient did not have access to video technology or had technical difficulties with video requiring transitioning to audio format only (telephone). Physical exam was limited to content and character of the telephone converstion. [name] was able to get the patient set up on a telephone visit.   Patient location: home alone  Patient and provider in visit Provider location: Office  I discussed the limitations of evaluation and management by telemedicine and the availability of in person appointments. The patient expressed understanding and agreed to proceed.   Visit Date: 06/28/2021  Today's healthcare provider: [name], DO     Subjective:    Patient ID: Leah [name], female    DOB: 04/19/1946, 75 y.o.   MRN: [id number]  Chief Complaint  Patient presents with   Nasal Congestion         HPI Patient is in today for c/o congestion , sore throat since last Friday.  Her covid test was neg yesterday   temp 99-100  No body aches  + cough , productive   Past Medical History:  Diagnosis Date   Allergy    Anemia    Anxiety    Barrett esophagus    Bladder pain    Cataract    Chronic bronchitis ([place])    per pt last episode july 2016   Complication of anesthesia    slow to wake   COPD (chronic obstructive pulmonary disease) ([employer])    Depression    Diverticulosis    Frequency of urination    GERD (gastroesophageal reflux disease)    Hiatal hernia    History of  adenomatous polyp of colon    tubular adenoma   History of chronic gastritis    History of diverticulitis of colon    History of esophageal dilatation    for stricture   History of esophageal spasm    Hyperlipidemia    Hypertension    IBS (irritable bowel syndrome)    Interstitial cystitis    PAD (peripheral artery disease) ([facility])    Productive cough    intermittant   Smokers' cough ([place])    Urgency of urination     Past Surgical History:  Procedure Laterality Date   ABDOMINAL AORTOGRAM N/A 10/14/2018   Procedure: ABDOMINAL AORTOGRAM;  Surgeon: Lorretta Harp, MD;  Location: [place] [facility];  Service: Cardiovascular;  Laterality: N/A;   ABDOMINAL HYSTERECTOMY  1975   ANTERIOR CERVICAL DECOMP/DISCECTOMY FUSION N/A 09/07/2014   Procedure: Exploration of Fusion and Removal of Anterior Cervical Hematoma;  Surgeon: Newman Pies, MD;  Location: [facility];  Service: Neurosurgery;  Laterality: N/A;   CARPAL TUNNEL RELEASE Right 1993   COLONOSCOPY  last one 04-30-2013   COLONOSCOPY WITH PROPOFOL N/A 09/09/2019   Procedure: COLONOSCOPY WITH PROPOFOL;  Surgeon: Doran Stabler, MD;  Location: [facility];  Service: Gastroenterology;  Laterality: N/A;   CYSTO WITH HYDRODISTENSION N/A 06/08/2015   Procedure: CYSTOSCOPY/HYDRODISTENSION INSTILLATION OF MARCAINE AND PYRIDIUM;  Surgeon: Irine Seal, MD;  Location: [facility]  [place];  Service: Urology;  Laterality: N/A;   CYSTO/  HOD/  BLADDER BX  1990's   ESOPHAGOGASTRODUODENOSCOPY  last one 09-11-2012   LAPAROSCOPIC APPENDECTOMY N/A 09/21/2013   Procedure: APPENDECTOMY LAPAROSCOPIC;  Surgeon: Rolm Bookbinder, MD;  Location: [place];  Service: General;  Laterality: N/A;   LAPAROSCOPIC CHOLECYSTECTOMY  2002   LAPAROSCOPY SIGMOID COLECTOMY  10-05-2008   diverticulitis   LOWER EXTREMITY ANGIOGRAPHY Left 10/14/2018   LOWER EXTREMITY ANGIOGRAPHY Bilateral 10/14/2018   Procedure: Lower Extremity Angiography;  Surgeon: Lorretta Harp, MD;  Location: [place] [facility];  Service: Cardiovascular;  Laterality: Bilateral;  ILIACS   MICROLARYNGOSCOPY  09-27-2006   w/ true vocal cord stripping  and bilateral bx's of lesion's (benign)   PERIPHERAL VASCULAR INTERVENTION  10/14/2018   Procedure: PERIPHERAL VASCULAR INTERVENTION;  Surgeon: Lorretta Harp, MD;  Location: [place] [facility];  Service: Cardiovascular;;   SINUS SURGERY WITH INSTATRAK  1991   TEMPOROMANDIBULAR JOINT SURGERY  1990   TONSILLECTOMY AND ADENOIDECTOMY  1964    Family History  Problem Relation Age of Onset   Pancreatic cancer Mother    Colon polyps Mother    Pancreatic cancer Father    Diabetes Father    Heart disease Father    Leukemia Father    Prostate cancer Father    Hypertension Father    Colon cancer Neg Hx    Esophageal cancer Neg Hx    Rectal cancer Neg Hx    Stomach cancer Neg Hx     Social History   Socioeconomic History   Marital status: Married    Spouse name: Not on file   Number of children: Not on file   Years of education: Not on file   Highest education level: Not on file  Occupational History   Not on file  Tobacco Use   Smoking status: Every Day    Packs/day: 0.75    Years: 47.00    Pack years: 35.25    Types: Cigarettes   Smokeless tobacco: Never   Tobacco comments:    counseled about smoking cessation  Vaping Use   Vaping Use: Never used  Substance and Sexual Activity   Alcohol use: No    Alcohol/week: 0.0 standard drinks   Drug use: No   Sexual activity: Not Currently    Birth control/protection: Post-menopausal  Other Topics Concern   Not on file  Social History Narrative   Not on file   Social Determinants of Health   Financial Resource Strain: Not on file  Food Insecurity: Not on file  Transportation Needs: Not on file  Physical Activity: Not on file  Stress: Not on file  Social Connections: Not on file  Intimate Partner Violence: Not on file    Outpatient Medications Prior to Visit   Medication Sig Dispense Refill   aspirin EC 81 MG EC tablet Take 1 tablet (81 mg total) by mouth daily.     atorvastatin (LIPITOR) 40 MG tablet TAKE 1 TABLET EVERY DAY (NEED MD APPOINTMENT FOR REFILLS) 90 tablet 0   clopidogrel (PLAVIX) 75 MG tablet TAKE 1 TABLET EVERY DAY 90 tablet 2   dicyclomine (BENTYL) 10 MG capsule TAKE 1 CAPSULE EVERY DAY AS NEEDED  FOR ABDOMINAL SPASMS 90 capsule 0   estradiol (CLIMARA - DOSED IN MG/24 HR) 0.1 mg/24hr patch Place 0.1 mg onto the skin every Monday.  0   Evolocumab (REPATHA SURECLICK) 712 MG/ML SOAJ INJECT 1 DOSE INTO THE SKIN EVERY 14 (  FOURTEEN) DAYS. 2 mL 11   ezetimibe (ZETIA) 10 MG tablet Take 1 tablet (10 mg total) by mouth daily. 90 tablet 1   famotidine (PEPCID) 20 MG tablet TAKE 1 TABLET TWICE DAILY 180 tablet 3   lisinopril (ZESTRIL) 20 MG tablet TAKE 1 TABLET EVERY DAY 90 tablet 1   LORazepam (ATIVAN) 0.5 MG tablet Take 0.5 mg by mouth at bedtime.   5   pantoprazole (PROTONIX) 40 MG tablet TAKE 1 TABLET BY MOUTH EVERY DAY 90 tablet 1   polyethylene glycol (MIRALAX) 17 g packet Take 17 g by mouth 2 (two) times daily. 30 each 0   sertraline (ZOLOFT) 50 MG tablet Take 25 mg by mouth at bedtime.      sucralfate (CARAFATE) 1 g tablet TAKE 1 TABLET BY MOUTH THREE TIMES A DAY 270 tablet 1   No facility-administered medications prior to visit.    Allergies  Allergen Reactions   Amoxicillin-Pot Clavulanate Shortness Of Breath, Itching and Swelling   Avelox [Moxifloxacin Hcl In Nacl] Swelling    Swelling, itching and shortness of breath   Ciprofloxacin Shortness Of Breath, Itching and Swelling   Keflex [Cephalexin] Shortness Of Breath and Swelling   Penicillins Shortness Of Breath, Itching and Swelling    Did it involve swelling of the face/tongue/throat, SOB, or low BP? Yes Did it involve sudden or severe rash/hives, skin peeling, or any reaction on the inside of your mouth or nose? No Did you need to seek medical attention at a hospital or  doctor's office? Yes When did it last happen?      Several years If all above answers are "NO", may proceed with cephalosporin use.    Sulfa Antibiotics Shortness Of Breath, Itching and Swelling   Prednisone Other (See Comments)    GI irritation; is able to tolerate injections   Acyclovir And Related Other (See Comments)    Pt does not recall this reaction   Clindamycin/Lincomycin Itching and Swelling   Levaquin [Levofloxacin] Hives   Zyvox [Linezolid] Diarrhea    ROS     Objective:    Physical Exam  There were no vitals taken for this visit. Wt Readings from Last 3 Encounters:  06/07/21 106 lb 9.6 oz (48.4 kg)  05/05/21 108 lb 3.2 oz (49.1 kg)  03/24/21 109 lb (49.4 kg)    Diabetic Foot Exam - Simple   No data filed    Lab Results  Component Value Date   WBC 4.8 06/07/2021   HGB 13.2 06/07/2021   HCT 40.8 06/07/2021   PLT 204.0 06/07/2021   GLUCOSE 89 06/07/2021   CHOL 122 06/07/2021   TRIG 127.0 06/07/2021   HDL 39.30 06/07/2021   LDLDIRECT 151.0 11/07/2016   LDLCALC 57 06/07/2021   ALT 7 06/07/2021   AST 13 06/07/2021   NA 140 06/07/2021   K 4.3 06/07/2021   CL 104 06/07/2021   CREATININE 0.77 06/07/2021   BUN 10 06/07/2021   CO2 32 06/07/2021   TSH 2.07 06/07/2021   INR 1.0 05/21/2009   HGBA1C 6.1 12/18/2017   MICROALBUR <0.7 11/09/2014    Lab Results  Component Value Date   TSH 2.07 06/07/2021   Lab Results  Component Value Date   WBC 4.8 06/07/2021   HGB 13.2 06/07/2021   HCT 40.8 06/07/2021   MCV 92.4 06/07/2021   PLT 204.0 06/07/2021   Lab Results  Component Value Date   NA 140 06/07/2021   K 4.3 06/07/2021   CO2 32 06/07/2021  GLUCOSE 89 06/07/2021   BUN 10 06/07/2021   CREATININE 0.77 06/07/2021   BILITOT 0.3 06/07/2021   ALKPHOS 57 06/07/2021   AST 13 06/07/2021   ALT 7 06/07/2021   PROT 5.9 (L) 06/07/2021   ALBUMIN 3.8 06/07/2021   CALCIUM 8.5 06/07/2021   ANIONGAP 5 09/08/2019   GFR 76.75 06/07/2021   Lab Results   Component Value Date   CHOL 122 06/07/2021   Lab Results  Component Value Date   HDL 39.30 06/07/2021   Lab Results  Component Value Date   LDLCALC 57 06/07/2021   Lab Results  Component Value Date   TRIG 127.0 06/07/2021   Lab Results  Component Value Date   CHOLHDL 3 06/07/2021   Lab Results  Component Value Date   HGBA1C 6.1 12/18/2017       Assessment & Plan:   Problem List Items Addressed This Visit       Unprioritized   Acute cough    Phenergan dm prn  rto prn       Relevant Medications   fluticasone (FLONASE) 50 MCG/ACT nasal spray   doxycycline (VIBRA-TABS) 100 MG tablet   promethazine-dextromethorphan (PROMETHAZINE-DM) 6.25-15 MG/5ML syrup   Sinusitis, acute - Primary    Doxycycline and flonase Saline spray Call or rto if no improvement in the next several days      Relevant Medications   fluticasone (FLONASE) 50 MCG/ACT nasal spray   doxycycline (VIBRA-TABS) 100 MG tablet   promethazine-dextromethorphan (PROMETHAZINE-DM) 6.25-15 MG/5ML syrup    I am having [name]. Dockter start on fluticasone, doxycycline, and promethazine-dextromethorphan. I am also having her maintain her sertraline, LORazepam, estradiol, aspirin, polyethylene glycol, ezetimibe, clopidogrel, lisinopril, pantoprazole, dicyclomine, atorvastatin, sucralfate, famotidine, and Repatha SureClick.  Meds ordered this encounter  Medications   fluticasone (FLONASE) 50 MCG/ACT nasal spray    Sig: Place 2 sprays into both nostrils daily.    Dispense:  16 g    Refill:  6   doxycycline (VIBRA-TABS) 100 MG tablet    Sig: Take 1 tablet (100 mg total) by mouth 2 (two) times daily.    Dispense:  20 tablet    Refill:  0   promethazine-dextromethorphan (PROMETHAZINE-DM) 6.25-15 MG/5ML syrup    Sig: Take 5 mLs by mouth 4 (four) times daily as needed.    Dispense:  118 mL    Refill:  0     I discussed the assessment and treatment plan with the patient. The patient was provided an  opportunity to ask questions and all were answered. The patient agreed with the plan and demonstrated an understanding of the instructions.   The patient was advised to call back or seek an in-person evaluation if the symptoms worsen or if the condition fails to improve as anticipated.  I provided 20 minutes of non-face-to-face time during this encounter.   [name], DO [place] [facility] [phone] (phone) [phone] (fax)  [place]

## 2021-06-28 NOTE — Assessment & Plan Note (Signed)
Doxycycline and flonase Saline spray Call or rto if no improvement in the next several days

## 2021-06-28 NOTE — Assessment & Plan Note (Signed)
Phenergan dm prn  rto prn

## 2021-07-11 DIAGNOSIS — F325 Major depressive disorder, single episode, in full remission: Secondary | ICD-10-CM | POA: Diagnosis not present

## 2021-07-28 ENCOUNTER — Telehealth: Payer: Self-pay

## 2021-07-28 NOTE — Telephone Encounter (Signed)
Authorization already on file for this request. Authorization starting on 07/31/2020 and ending on 07/30/2022. By [place]

## 2021-08-29 DIAGNOSIS — M25552 Pain in left hip: Secondary | ICD-10-CM | POA: Diagnosis not present

## 2021-09-10 ENCOUNTER — Other Ambulatory Visit: Payer: Self-pay | Admitting: Family Medicine

## 2021-09-10 ENCOUNTER — Other Ambulatory Visit: Payer: Self-pay | Admitting: Gastroenterology

## 2021-09-10 DIAGNOSIS — I10 Essential (primary) hypertension: Secondary | ICD-10-CM

## 2021-09-16 ENCOUNTER — Ambulatory Visit (HOSPITAL_BASED_OUTPATIENT_CLINIC_OR_DEPARTMENT_OTHER)
Admission: RE | Admit: 2021-09-16 | Discharge: 2021-09-16 | Disposition: A | Discharge status: Home / Self Care | Payer: Medicare HMO | Source: Ambulatory Visit | Attending: Family Medicine | Admitting: Family Medicine

## 2021-09-16 ENCOUNTER — Encounter: Payer: Self-pay | Admitting: Family Medicine

## 2021-09-16 ENCOUNTER — Other Ambulatory Visit: Payer: Self-pay

## 2021-09-16 ENCOUNTER — Ambulatory Visit (INDEPENDENT_AMBULATORY_CARE_PROVIDER_SITE_OTHER): Payer: Medicare HMO | Admitting: Family Medicine

## 2021-09-16 VITALS — BP 130/70 | HR 68 | Temp 97.7°F | Resp 16 | Ht 61.0 in | Wt 107.4 lb

## 2021-09-16 DIAGNOSIS — E785 Hyperlipidemia, unspecified: Secondary | ICD-10-CM | POA: Diagnosis not present

## 2021-09-16 DIAGNOSIS — M4722 Other spondylosis with radiculopathy, cervical region: Secondary | ICD-10-CM

## 2021-09-16 DIAGNOSIS — M542 Cervicalgia: Secondary | ICD-10-CM

## 2021-09-16 DIAGNOSIS — M4322 Fusion of spine, cervical region: Secondary | ICD-10-CM | POA: Diagnosis not present

## 2021-09-16 MED ORDER — METHOCARBAMOL 500 MG PO TABS: 500.0000 mg | ORAL_TABLET | Freq: Four times a day (QID) | ORAL | 1 refills | Status: DC

## 2021-09-16 MED ORDER — EZETIMIBE 10 MG PO TABS: 10.0000 mg | ORAL_TABLET | Freq: Every day | ORAL | 1 refills | Status: DC

## 2021-09-16 NOTE — Progress Notes (Signed)
Subjective:   By signing my name below, I, Zite Okoli, attest that this documentation has been prepared under the direction and in the presence of  [name], DO. 09/16/2021    Patient ID: Leah [name], female    DOB: 1945/09/01, 76 y.o.   MRN: [id number]  Chief Complaint  Patient presents with   Neck Pain    Here for Complains of neck pain     HPI Patient is in today for an office visit.  She complains of pain and burning in the back of her neck that radiates to her shoulders for the past week.  She has an appointment to see her neurologist at the end of this month and was told to get an x-ray before her visit.  She had a discectomy fusion and adds she has had several episodes of pain since the surgery and received injections.   She is requesting for a refill on 10 mg zetia.   Past Medical History:  Diagnosis Date   Allergy    Anemia    Anxiety    Barrett esophagus    Bladder pain    Cataract    Chronic bronchitis ([place])    per pt last episode july 2016   Complication of anesthesia    slow to wake   COPD (chronic obstructive pulmonary disease) (HCC)    Depression    Diverticulosis    Frequency of urination    GERD (gastroesophageal reflux disease)    Hiatal hernia    History of adenomatous polyp of colon    tubular adenoma   History of chronic gastritis    History of diverticulitis of colon    History of esophageal dilatation    for stricture   History of esophageal spasm    Hyperlipidemia    Hypertension    IBS (irritable bowel syndrome)    Interstitial cystitis    PAD (peripheral artery disease) (HCC)    Productive cough    intermittant   Smokers' cough ([place])    Urgency of urination     Past Surgical History:  Procedure Laterality Date   ABDOMINAL AORTOGRAM N/A 10/14/2018   Procedure: ABDOMINAL AORTOGRAM;  Surgeon: Lorretta Harp, MD;  Location: [facility];  Service: Cardiovascular;  Laterality: N/A;   ABDOMINAL HYSTERECTOMY   1975   ANTERIOR CERVICAL DECOMP/DISCECTOMY FUSION N/A 09/07/2014   Procedure: Exploration of Fusion and Removal of Anterior Cervical Hematoma;  Surgeon: Newman Pies, MD;  Location: [facility];  Service: Neurosurgery;  Laterality: N/A;   CARPAL TUNNEL RELEASE Right 1993   COLONOSCOPY  last one 04-30-2013   COLONOSCOPY WITH PROPOFOL N/A 09/09/2019   Procedure: COLONOSCOPY WITH PROPOFOL;  Surgeon: Doran Stabler, MD;  Location: [facility];  Service: Gastroenterology;  Laterality: N/A;   CYSTO WITH HYDRODISTENSION N/A 06/08/2015   Procedure: CYSTOSCOPY/HYDRODISTENSION INSTILLATION OF MARCAINE AND PYRIDIUM;  Surgeon: Irine Seal, MD;  Location: [facility];  Service: Urology;  Laterality: N/A;   CYSTO/  HOD/  BLADDER BX  1990's   ESOPHAGOGASTRODUODENOSCOPY  last one 09-11-2012   LAPAROSCOPIC APPENDECTOMY N/A 09/21/2013   Procedure: APPENDECTOMY LAPAROSCOPIC;  Surgeon: Rolm Bookbinder, MD;  Location: [facility];  Service: General;  Laterality: N/A;   LAPAROSCOPIC CHOLECYSTECTOMY  2002   LAPAROSCOPY SIGMOID COLECTOMY  10-05-2008   diverticulitis   LOWER EXTREMITY ANGIOGRAPHY Left 10/14/2018   LOWER EXTREMITY ANGIOGRAPHY Bilateral 10/14/2018   Procedure: Lower Extremity Angiography;  Surgeon: Lorretta Harp, MD;  Location: [facility];  Service: Cardiovascular;  Laterality: Bilateral;  ILIACS   MICROLARYNGOSCOPY  09-27-2006   w/ true vocal cord stripping  and bilateral bx's of lesion's (benign)   PERIPHERAL VASCULAR INTERVENTION  10/14/2018   Procedure: PERIPHERAL VASCULAR INTERVENTION;  Surgeon: Lorretta Harp, MD;  Location: [facility];  Service: Cardiovascular;;   SINUS SURGERY WITH INSTATRAK  1991   TEMPOROMANDIBULAR JOINT SURGERY  1990   TONSILLECTOMY AND ADENOIDECTOMY  1964    Family History  Problem Relation Age of Onset   Pancreatic cancer Mother    Colon polyps Mother    Pancreatic cancer Father    Diabetes Father    Heart disease Father     Leukemia Father    Prostate cancer Father    Hypertension Father    Colon cancer Neg Hx    Esophageal cancer Neg Hx    Rectal cancer Neg Hx    Stomach cancer Neg Hx     Social History   Socioeconomic History   Marital status: Married    Spouse name: Not on file   Number of children: Not on file   Years of education: Not on file   Highest education level: Not on file  Occupational History   Not on file  Tobacco Use   Smoking status: Every Day    Packs/day: 0.75    Years: 47.00    Pack years: 35.25    Types: Cigarettes   Smokeless tobacco: Never   Tobacco comments:    counseled about smoking cessation  Vaping Use   Vaping Use: Never used  Substance and Sexual Activity   Alcohol use: No    Alcohol/week: 0.0 standard drinks   Drug use: No   Sexual activity: Not Currently    Birth control/protection: Post-menopausal  Other Topics Concern   Not on file  Social History Narrative   Not on file   Social Determinants of Health   Financial Resource Strain: Not on file  Food Insecurity: Not on file  Transportation Needs: Not on file  Physical Activity: Not on file  Stress: Not on file  Social Connections: Not on file  Intimate Partner Violence: Not on file    Outpatient Medications Prior to Visit  Medication Sig Dispense Refill   aspirin EC 81 MG EC tablet Take 1 tablet (81 mg total) by mouth daily.     atorvastatin (LIPITOR) 40 MG tablet TAKE 1 TABLET EVERY DAY (NEED MD APPOINTMENT FOR REFILLS) 90 tablet 0   clopidogrel (PLAVIX) 75 MG tablet TAKE 1 TABLET EVERY DAY 90 tablet 2   dicyclomine (BENTYL) 10 MG capsule TAKE 1 CAPSULE BY MOUTH AS NEEDED FOR UP TO 1 DOSE FOR SPASMS. 90 capsule 0   doxycycline (VIBRA-TABS) 100 MG tablet Take 1 tablet (100 mg total) by mouth 2 (two) times daily. 20 tablet 0   estradiol (CLIMARA - DOSED IN MG/24 HR) 0.1 mg/24hr patch Place 0.1 mg onto the skin every Monday.  0   Evolocumab (REPATHA SURECLICK) 672 MG/ML SOAJ INJECT 1 DOSE INTO  THE SKIN EVERY 14 (FOURTEEN) DAYS. 2 mL 11   famotidine (PEPCID) 20 MG tablet TAKE 1 TABLET TWICE DAILY 180 tablet 3   fluticasone (FLONASE) 50 MCG/ACT nasal spray Place 2 sprays into both nostrils daily. 16 g 6   lisinopril (ZESTRIL) 20 MG tablet TAKE 1 TABLET EVERY DAY 90 tablet 1   LORazepam (ATIVAN) 0.5 MG tablet Take 0.5 mg by mouth at bedtime.   5  pantoprazole (PROTONIX) 40 MG tablet TAKE 1 TABLET BY MOUTH EVERY DAY 90 tablet 1   polyethylene glycol (MIRALAX) 17 g packet Take 17 g by mouth 2 (two) times daily. 30 each 0   promethazine-dextromethorphan (PROMETHAZINE-DM) 6.25-15 MG/5ML syrup Take 5 mLs by mouth 4 (four) times daily as needed. 118 mL 0   sertraline (ZOLOFT) 50 MG tablet Take 25 mg by mouth at bedtime.      sucralfate (CARAFATE) 1 g tablet TAKE 1 TABLET BY MOUTH THREE TIMES A DAY 270 tablet 1   ezetimibe (ZETIA) 10 MG tablet Take 1 tablet (10 mg total) by mouth daily. 90 tablet 1   No facility-administered medications prior to visit.    Allergies  Allergen Reactions   Amoxicillin-Pot Clavulanate Shortness Of Breath, Itching and Swelling   Avelox [Moxifloxacin Hcl In Nacl] Swelling    Swelling, itching and shortness of breath   Ciprofloxacin Shortness Of Breath, Itching and Swelling   Keflex [Cephalexin] Shortness Of Breath and Swelling   Penicillins Shortness Of Breath, Itching and Swelling    Did it involve swelling of the face/tongue/throat, SOB, or low BP? Yes Did it involve sudden or severe rash/hives, skin peeling, or any reaction on the inside of your mouth or nose? No Did you need to seek medical attention at a hospital or doctor's office? Yes When did it last happen?      Several years If all above answers are NO, may proceed with cephalosporin use.    Sulfa Antibiotics Shortness Of Breath, Itching and Swelling   Prednisone Other (See Comments)    GI irritation; is able to tolerate injections   Acyclovir And Related Other (See Comments)    Pt does not  recall this reaction   Clindamycin/Lincomycin Itching and Swelling   Levaquin [Levofloxacin] Hives   Zyvox [Linezolid] Diarrhea    Review of Systems  Constitutional:  Negative for fever.  HENT:  Negative for congestion, ear pain, hearing loss, sinus pain and sore throat.   Eyes:  Negative for blurred vision and pain.  Respiratory:  Negative for cough, sputum production, shortness of breath and wheezing.   Cardiovascular:  Negative for chest pain and palpitations.  Gastrointestinal:  Negative for blood in stool, constipation, diarrhea, nausea and vomiting.  Genitourinary:  Negative for dysuria, frequency, hematuria and urgency.  Musculoskeletal:  Positive for neck pain. Negative for back pain, falls and myalgias.  Neurological:  Negative for dizziness, sensory change, loss of consciousness, weakness and headaches.  Endo/Heme/Allergies:  Negative for environmental allergies. Does not bruise/bleed easily.  Psychiatric/Behavioral:  Negative for depression and suicidal ideas. The patient is not nervous/anxious and does not have insomnia.       Objective:    Physical Exam Constitutional:      General: She is not in acute distress.    Appearance: Normal appearance. She is not ill-appearing.  HENT:     Head: Normocephalic and atraumatic.     Right Ear: External ear normal.     Left Ear: External ear normal.  Eyes:     Extraocular Movements: Extraocular movements intact.     Pupils: Pupils are equal, round, and reactive to light.  Neck:     Comments: Spasms in the right trapezoid muscle, no pain on range of motion Cardiovascular:     Rate and Rhythm: Normal rate and regular rhythm.     Pulses: Normal pulses.     Heart sounds: Normal heart sounds. No murmur heard.   No gallop.  Pulmonary:  Effort: Pulmonary effort is normal. No respiratory distress.     Breath sounds: Normal breath sounds. No wheezing, rhonchi or rales.  Abdominal:     General: Bowel sounds are normal. There is  no distension.     Palpations: Abdomen is soft. There is no mass.     Tenderness: There is no abdominal tenderness. There is no guarding or rebound.     Hernia: No hernia is present.  Musculoskeletal:     Cervical back: Normal range of motion and neck supple.  Lymphadenopathy:     Cervical: No cervical adenopathy.  Skin:    General: Skin is warm and dry.  Neurological:     Mental Status: She is alert and oriented to person, place, and time.  Psychiatric:        Behavior: Behavior normal.    BP 130/70 (BP Location: Right Arm, Patient Position: Sitting, Cuff Size: Small)    Pulse 68    Temp 97.7 F (36.5 C) (Oral)    Resp 16    Ht 5\' 1"  (1.549 m)    Wt 107 lb 6.4 oz (48.7 kg)    SpO2 91%    BMI 20.29 kg/m  Wt Readings from Last 3 Encounters:  09/16/21 107 lb 6.4 oz (48.7 kg)  06/07/21 106 lb 9.6 oz (48.4 kg)  05/05/21 108 lb 3.2 oz (49.1 kg)    Diabetic Foot Exam - Simple   No data filed    Lab Results  Component Value Date   WBC 4.8 06/07/2021   HGB 13.2 06/07/2021   HCT 40.8 06/07/2021   PLT 204.0 06/07/2021   GLUCOSE 89 06/07/2021   CHOL 122 06/07/2021   TRIG 127.0 06/07/2021   HDL 39.30 06/07/2021   LDLDIRECT 151.0 11/07/2016   LDLCALC 57 06/07/2021   ALT 7 06/07/2021   AST 13 06/07/2021   NA 140 06/07/2021   K 4.3 06/07/2021   CL 104 06/07/2021   CREATININE 0.77 06/07/2021   BUN 10 06/07/2021   CO2 32 06/07/2021   TSH 2.07 06/07/2021   INR 1.0 05/21/2009   HGBA1C 6.1 12/18/2017   MICROALBUR <0.7 11/09/2014    Lab Results  Component Value Date   TSH 2.07 06/07/2021   Lab Results  Component Value Date   WBC 4.8 06/07/2021   HGB 13.2 06/07/2021   HCT 40.8 06/07/2021   MCV 92.4 06/07/2021   PLT 204.0 06/07/2021   Lab Results  Component Value Date   NA 140 06/07/2021   K 4.3 06/07/2021   CO2 32 06/07/2021   GLUCOSE 89 06/07/2021   BUN 10 06/07/2021   CREATININE 0.77 06/07/2021   BILITOT 0.3 06/07/2021   ALKPHOS 57 06/07/2021   AST 13  06/07/2021   ALT 7 06/07/2021   PROT 5.9 (L) 06/07/2021   ALBUMIN 3.8 06/07/2021   CALCIUM 8.5 06/07/2021   ANIONGAP 5 09/08/2019   GFR 76.75 06/07/2021   Lab Results  Component Value Date   CHOL 122 06/07/2021   Lab Results  Component Value Date   HDL 39.30 06/07/2021   Lab Results  Component Value Date   LDLCALC 57 06/07/2021   Lab Results  Component Value Date   TRIG 127.0 06/07/2021   Lab Results  Component Value Date   CHOLHDL 3 06/07/2021   Lab Results  Component Value Date   HGBA1C 6.1 12/18/2017       Assessment & Plan:   Problem List Items Addressed This Visit  Unprioritized   Neck pain - Primary   Relevant Medications   methocarbamol (ROBAXIN) 500 MG tablet   Other Relevant Orders   DG Cervical Spine Complete   Cervical spondylosis with radiculopathy    Check xrays and fax to neurosurgery       Relevant Medications   methocarbamol (ROBAXIN) 500 MG tablet   Other Visit Diagnoses     Hyperlipidemia, unspecified hyperlipidemia type       Relevant Medications   ezetimibe (ZETIA) 10 MG tablet       Meds ordered this encounter  Medications   methocarbamol (ROBAXIN) 500 MG tablet    Sig: Take 1 tablet (500 mg total) by mouth 4 (four) times daily.    Dispense:  45 tablet    Refill:  1   ezetimibe (ZETIA) 10 MG tablet    Sig: Take 1 tablet (10 mg total) by mouth daily.    Dispense:  90 tablet    Refill:  1    I,Zite Okoli,acting as a Education administrator for [employer], DO.,have documented all relevant documentation on the behalf of [name], DO,as directed by  [name], DO while in the presence of [name], DO.   I, [name], DO., personally preformed the services described in this documentation.  All medical record entries made by the scribe were at my direction and in my presence.  I have reviewed the chart and discharge instructions (if applicable) and agree that the record reflects my  personal performance and is accurate and complete. 09/16/2021

## 2021-09-16 NOTE — Patient Instructions (Signed)
Cervical Radiculopathy Cervical radiculopathy happens when a nerve in the neck (a cervical nerve) is pinched or bruised. This condition can happen because of an injury to the cervical spine (vertebrae) in the neck, or as part of the normal aging process. Pressure on the cervical nerves can cause pain or numbness that travels from the neck all the way down to the arm and fingers. This condition usually gets better with rest. Treatment may be needed if the condition does not improve. What are the causes? This condition may be caused by: A neck injury. A bulging (herniated) disk. Muscle spasms. Muscle tightness in the neck due to overuse. Arthritis. Breakdown or degeneration in the bones and joints of the spine (spondylosis) due to aging. Bone spurs that may develop near the cervical nerves. What are the signs or symptoms? Symptoms of this condition include: Pain. The pain may travel from the neck to the arm and hand. The pain can be severe or irritating. It may get worse when you move your neck. Numbness or tingling in your arm or hand. Weakness in the affected arm and hand, in severe cases. How is this diagnosed? This condition may be diagnosed based on your symptoms, your medical history, and a physical exam. You may also have tests, including: X-rays. CT scan. MRI. Electromyogram (EMG). Nerve conduction tests. How is this treated? In many cases, treatment is not needed for this condition. With rest, the condition usually gets better over time. If treatment is needed, options may include: Wearing a soft neck collar (cervical collar) for short periods of time. Doing physical therapy to strengthen your neck muscles. Taking medicines. These may include NSAIDs, such as ibuprofen, or oral corticosteroids. Having spinal injections, in severe cases. Having surgery. This may be needed if other treatments do not help. Different types of surgery may be done depending on the cause of this  condition. Follow these instructions at home: If you have a cervical collar: Wear it as told by your health care provider. Remove it only as told by your health care provider. Ask your health care provider if you can remove the cervical collar for cleaning and bathing. If you are allowed to remove the collar for cleaning or bathing: Follow instructions from your health care provider about how to remove the collar safely. Clean the collar by wiping it with mild soap and water and drying it completely. Take out any removable pads in the collar every 1-2 days, and wash them by hand with soap and water. Let them air-dry completely before you put them back in the collar. Check your skin under the collar for irritation or sores. If you see any, tell your health care provider. Managing pain   Take over-the-counter and prescription medicines only as told by your health care provider. If directed, put ice on the affected area. To do this: If you have a soft neck collar, remove it as told by your health care provider. Put ice in a plastic bag. Place a towel between your skin and the bag. Leave the ice on for 20 minutes, 2-3 times a day. Remove the ice if your skin turns bright red. This is very important. If you cannot feel pain, heat, or cold, you have a greater risk of damage to the area. If applying ice does not help, you can try using heat. Use the heat source that your health care provider recommends, such as a moist heat pack or a heating pad. Place a towel between your skin and   the heat source. Leave the heat on for 20-30 minutes. Remove the heat if your skin turns bright red. This is especially important if you are unable to feel pain, heat, or cold. You have a greater risk of getting burned. Try a gentle neck and shoulder massage to help relieve symptoms. Activity Rest as needed. Return to your normal activities as told by your health care provider. Ask your health care provider what  activities are safe for you. Do stretching and strengthening exercises as told by your health care provider or your physical therapist. You may have to avoid lifting. Ask your health care provider how much you can safely lift. General instructions Use a flat pillow when you sleep. Do not drive while wearing a cervical collar. If you do not have a cervical collar, ask your health care provider if it is safe to drive while your neck heals. Ask your health care provider if the medicine prescribed to you requires you to avoid driving or using machinery. Do not use any products that contain nicotine or tobacco. These products include cigarettes, chewing tobacco, and vaping devices, such as e-cigarettes. If you need help quitting, ask your health care provider. Keep all follow-up visits. This is important. Contact a health care provider if: Your condition does not improve with treatment. Get help right away if: Your pain gets much worse and is not controlled with medicines. You have weakness or numbness in your hand, arm, face, or leg. You have a high fever. You have a stiff, rigid neck. You lose control of your bowels or your bladder (have incontinence). You have trouble with walking, balance, or speaking. Summary Cervical radiculopathy happens when a nerve in the neck is pinched or bruised. A nerve can get pinched from a bulging disk, arthritis, muscle spasms, or an injury to the neck. Symptoms include pain, tingling, or numbness radiating from the neck to the arm or hand. Weakness can also occur in severe cases. Treatment may include rest, wearing a cervical collar, and physical therapy. Medicines may be prescribed to help with pain. In severe cases, injections or surgery may be needed. This information is not intended to replace advice given to you by your health care provider. Make sure you discuss any questions you have with your health care provider. Document Revised: 01/20/2021 Document  Reviewed: 01/20/2021 Elsevier Patient Education  2022 [employer]  

## 2021-09-16 NOTE — Assessment & Plan Note (Signed)
Check xrays and fax to neurosurgery

## 2021-09-27 DIAGNOSIS — M542 Cervicalgia: Secondary | ICD-10-CM | POA: Diagnosis not present

## 2021-09-27 DIAGNOSIS — I10 Essential (primary) hypertension: Secondary | ICD-10-CM | POA: Diagnosis not present

## 2021-09-27 DIAGNOSIS — M25511 Pain in right shoulder: Secondary | ICD-10-CM | POA: Diagnosis not present

## 2021-10-01 ENCOUNTER — Other Ambulatory Visit: Payer: Self-pay | Admitting: Cardiovascular Disease

## 2021-10-12 DIAGNOSIS — M2578 Osteophyte, vertebrae: Secondary | ICD-10-CM | POA: Diagnosis not present

## 2021-10-12 DIAGNOSIS — M542 Cervicalgia: Secondary | ICD-10-CM | POA: Diagnosis not present

## 2021-10-14 ENCOUNTER — Other Ambulatory Visit: Payer: Self-pay | Admitting: Family Medicine

## 2021-10-14 DIAGNOSIS — K219 Gastro-esophageal reflux disease without esophagitis: Secondary | ICD-10-CM

## 2021-10-28 ENCOUNTER — Other Ambulatory Visit: Payer: Self-pay | Admitting: Cardiovascular Disease

## 2021-10-28 ENCOUNTER — Other Ambulatory Visit: Payer: Self-pay | Admitting: Student

## 2021-10-28 DIAGNOSIS — M542 Cervicalgia: Secondary | ICD-10-CM

## 2021-11-01 DIAGNOSIS — Z01411 Encounter for gynecological examination (general) (routine) with abnormal findings: Secondary | ICD-10-CM | POA: Diagnosis not present

## 2021-11-01 DIAGNOSIS — Z1231 Encounter for screening mammogram for malignant neoplasm of breast: Secondary | ICD-10-CM | POA: Diagnosis not present

## 2021-11-01 DIAGNOSIS — N951 Menopausal and female climacteric states: Secondary | ICD-10-CM | POA: Diagnosis not present

## 2021-11-02 ENCOUNTER — Other Ambulatory Visit: Payer: Self-pay | Admitting: Gastroenterology

## 2021-11-02 ENCOUNTER — Other Ambulatory Visit: Payer: Self-pay | Admitting: Obstetrics and Gynecology

## 2021-11-02 DIAGNOSIS — Z1231 Encounter for screening mammogram for malignant neoplasm of breast: Secondary | ICD-10-CM

## 2021-11-04 ENCOUNTER — Other Ambulatory Visit: Payer: Self-pay | Admitting: Obstetrics and Gynecology

## 2021-11-04 DIAGNOSIS — E2839 Other primary ovarian failure: Secondary | ICD-10-CM

## 2021-11-08 ENCOUNTER — Ambulatory Visit
Admission: RE | Admit: 2021-11-08 | Discharge: 2021-11-08 | Disposition: A | Discharge status: Home / Self Care | Payer: Medicare HMO | Source: Ambulatory Visit | Attending: Student | Admitting: Student

## 2021-11-08 DIAGNOSIS — M50223 Other cervical disc displacement at C6-C7 level: Secondary | ICD-10-CM | POA: Diagnosis not present

## 2021-11-08 DIAGNOSIS — M542 Cervicalgia: Secondary | ICD-10-CM

## 2021-11-08 MED ORDER — IOPAMIDOL (ISOVUE-M 300) INJECTION 61%
1.0000 mL | Freq: Once | INTRAMUSCULAR | Status: AC | PRN
  Administered 2021-11-08: 1 mL via EPIDURAL

## 2021-11-08 MED ORDER — TRIAMCINOLONE ACETONIDE 40 MG/ML IJ SUSP (RADIOLOGY)
60.0000 mg | Freq: Once | INTRAMUSCULAR | Status: AC
  Administered 2021-11-08: 60 mg via EPIDURAL

## 2021-11-08 NOTE — Discharge Instructions (Addendum)

## 2021-11-16 DIAGNOSIS — Z780 Asymptomatic menopausal state: Secondary | ICD-10-CM | POA: Diagnosis not present

## 2021-11-16 DIAGNOSIS — Z1231 Encounter for screening mammogram for malignant neoplasm of breast: Secondary | ICD-10-CM | POA: Diagnosis not present

## 2021-11-16 DIAGNOSIS — N3001 Acute cystitis with hematuria: Secondary | ICD-10-CM | POA: Diagnosis not present

## 2021-11-16 DIAGNOSIS — M85851 Other specified disorders of bone density and structure, right thigh: Secondary | ICD-10-CM | POA: Diagnosis not present

## 2021-11-16 DIAGNOSIS — N390 Urinary tract infection, site not specified: Secondary | ICD-10-CM | POA: Diagnosis not present

## 2021-11-16 DIAGNOSIS — M85852 Other specified disorders of bone density and structure, left thigh: Secondary | ICD-10-CM | POA: Diagnosis not present

## 2021-11-16 LAB — HM MAMMOGRAPHY

## 2021-11-25 DIAGNOSIS — R922 Inconclusive mammogram: Secondary | ICD-10-CM | POA: Diagnosis not present

## 2021-11-25 LAB — HM MAMMOGRAPHY

## 2021-12-08 DIAGNOSIS — F325 Major depressive disorder, single episode, in full remission: Secondary | ICD-10-CM | POA: Diagnosis not present

## 2021-12-09 DIAGNOSIS — N3010 Interstitial cystitis (chronic) without hematuria: Secondary | ICD-10-CM | POA: Diagnosis not present

## 2022-01-05 ENCOUNTER — Telehealth: Payer: Self-pay | Admitting: Family Medicine

## 2022-01-05 NOTE — Telephone Encounter (Signed)
Left message for patient to call back and schedule Medicare Annual Wellness Visit (AWV).   Please offer to do virtually or by telephone.  Left office number and my jabber [phone].  AWVI eligible as of 02/28/2018  Please schedule at anytime with Nurse Health Advisor.

## 2022-01-10 ENCOUNTER — Encounter: Payer: Self-pay | Admitting: Family Medicine

## 2022-01-10 ENCOUNTER — Ambulatory Visit (INDEPENDENT_AMBULATORY_CARE_PROVIDER_SITE_OTHER): Payer: Medicare HMO | Admitting: Family Medicine

## 2022-01-10 VITALS — BP 112/80 | HR 86 | Temp 98.0°F | Resp 18 | Ht 61.0 in | Wt 102.2 lb

## 2022-01-10 DIAGNOSIS — E785 Hyperlipidemia, unspecified: Secondary | ICD-10-CM

## 2022-01-10 DIAGNOSIS — R1032 Left lower quadrant pain: Secondary | ICD-10-CM

## 2022-01-10 LAB — COMPREHENSIVE METABOLIC PANEL
ALT: 7 U/L (ref 0–35)
AST: 11 U/L (ref 0–37)
Albumin: 4 g/dL (ref 3.5–5.2)
Alkaline Phosphatase: 59 U/L (ref 39–117)
BUN: 12 mg/dL (ref 6–23)
CO2: 31 mEq/L (ref 19–32)
Calcium: 9 mg/dL (ref 8.4–10.5)
Chloride: 101 mEq/L (ref 96–112)
Creatinine, Ser: 0.91 mg/dL (ref 0.40–1.20)
GFR: 62.54 mL/min (ref >60.00)
Glucose, Bld: 91 mg/dL (ref 70–99)
Potassium: 4.3 mEq/L (ref 3.5–5.1)
Sodium: 138 mEq/L (ref 135–145)
Total Bilirubin: 0.3 mg/dL (ref 0.2–1.2)
Total Protein: 6.2 g/dL (ref 6.0–8.3)

## 2022-01-10 LAB — CBC WITH DIFFERENTIAL/PLATELET
Basophils Absolute: 0.1 10*3/uL (ref 0.0–0.1)
Basophils Relative: 1.3 % (ref 0.0–3.0)
Eosinophils Absolute: 0.2 10*3/uL (ref 0.0–0.7)
Eosinophils Relative: 3.6 % (ref 0.0–5.0)
HCT: 41.3 % (ref 36.0–46.0)
Hemoglobin: 13.7 g/dL (ref 12.0–15.0)
Lymphocytes Relative: 27 % (ref 12.0–46.0)
Lymphs Abs: 1.7 10*3/uL (ref 0.7–4.0)
MCHC: 33.1 g/dL (ref 30.0–36.0)
MCV: 92.7 fl (ref 78.0–100.0)
Monocytes Absolute: 0.4 10*3/uL (ref 0.1–1.0)
Monocytes Relative: 6.2 % (ref 3.0–12.0)
Neutro Abs: 3.8 10*3/uL (ref 1.4–7.7)
Neutrophils Relative %: 61.9 % (ref 43.0–77.0)
Platelets: 206 10*3/uL (ref 150.0–400.0)
RBC: 4.45 Mil/uL (ref 3.87–5.11)
RDW: 13.8 % (ref 11.5–15.5)
WBC: 6.2 10*3/uL (ref 4.0–10.5)

## 2022-01-10 LAB — LIPID PANEL
Cholesterol: 154 mg/dL (ref 0–200)
HDL: 38.3 mg/dL — ABNORMAL LOW (ref >39.00)
NonHDL: 115.4
Total CHOL/HDL Ratio: 4
Triglycerides: 213 mg/dL — ABNORMAL HIGH (ref 0.0–149.0)
VLDL: 42.6 mg/dL — ABNORMAL HIGH (ref 0.0–40.0)

## 2022-01-10 LAB — POC URINALSYSI DIPSTICK (AUTOMATED)
Bilirubin, UA: NEGATIVE
Blood, UA: NEGATIVE
Glucose, UA: NEGATIVE
Ketones, UA: NEGATIVE
Leukocytes, UA: NEGATIVE
Nitrite, UA: NEGATIVE
Protein, UA: NEGATIVE
Spec Grav, UA: 1.02 (ref 1.010–1.025)
Urobilinogen, UA: 0.2 E.U./dL
pH, UA: 6 (ref 5.0–8.0)

## 2022-01-10 LAB — LDL CHOLESTEROL, DIRECT: Direct LDL: 91 mg/dL

## 2022-01-10 NOTE — Patient Instructions (Signed)

## 2022-01-10 NOTE — Progress Notes (Signed)
Subjective:   By signing my name below, I, Leah [name], attest that this documentation has been prepared under the direction and in the presence of Leah Held, DO  01/10/2022    Patient ID: Leah [name], female    DOB: 07-09-1946, 76 y.o.   MRN: [id number]  Chief Complaint  Patient presents with   Abdominal Pain    Pt states having left abdominal pain. X1 week. Pt states no burning or freq, or constipation.     Abdominal Pain Associated symptoms include nausea. Pertinent negatives include no fever, headaches or vomiting.   Patient is in today for a office visit.   She complains of lower abdominal pain. She's also experiencing nausea and decreased appetite. She tried making an appointment with her GI specialist but could not get one within this month. Her last colonoscopy showed she had diverticulosis. She denies finding blood in her stools. She is taking OTC miralax to manage her symptoms.  She reports her appetite has been decreased compared to her baseline. She has lost weight since her symptoms started.  Wt Readings from Last 3 Encounters:  01/10/22 102 lb 3.2 oz (46.4 kg)  09/16/21 107 lb 6.4 oz (48.7 kg)  06/07/21 106 lb 9.6 oz (48.4 kg)    Past Medical History:  Diagnosis Date   Allergy    Anemia    Anxiety    Barrett esophagus    Bladder pain    Cataract    Chronic bronchitis ([place])    per pt last episode july 2016   Complication of anesthesia    slow to wake   COPD (chronic obstructive pulmonary disease) ([place])    Depression    Diverticulosis    Frequency of urination    GERD (gastroesophageal reflux disease)    Hiatal hernia    History of adenomatous polyp of colon    tubular adenoma   History of chronic gastritis    History of diverticulitis of colon    History of esophageal dilatation    for stricture   History of esophageal spasm    Hyperlipidemia    Hypertension    IBS (irritable bowel syndrome)    Interstitial cystitis    PAD  (peripheral artery disease) ([facility])    Productive cough    intermittant   Smokers' cough ([place])    Urgency of urination     Past Surgical History:  Procedure Laterality Date   ABDOMINAL AORTOGRAM N/A 10/14/2018   Procedure: ABDOMINAL AORTOGRAM;  Surgeon: Lorretta Harp, MD;  Location: [place] [facility];  Service: Cardiovascular;  Laterality: N/A;   ABDOMINAL HYSTERECTOMY  1975   ANTERIOR CERVICAL DECOMP/DISCECTOMY FUSION N/A 09/07/2014   Procedure: Exploration of Fusion and Removal of Anterior Cervical Hematoma;  Surgeon: Newman Pies, MD;  Location: [facility]S;  Service: Neurosurgery;  Laterality: N/A;   CARPAL TUNNEL RELEASE Right 1993   COLONOSCOPY  last one 04-30-2013   COLONOSCOPY WITH PROPOFOL N/A 09/09/2019   Procedure: COLONOSCOPY WITH PROPOFOL;  Surgeon: Doran Stabler, MD;  Location: [facility];  Service: Gastroenterology;  Laterality: N/A;   CYSTO WITH HYDRODISTENSION N/A 06/08/2015   Procedure: CYSTOSCOPY/HYDRODISTENSION INSTILLATION OF MARCAINE AND PYRIDIUM;  Surgeon: Irine Seal, MD;  Location: [facility];  Service: Urology;  Laterality: N/A;   CYSTO/  HOD/  BLADDER BX  1990's   ESOPHAGOGASTRODUODENOSCOPY  last one 09-11-2012   LAPAROSCOPIC APPENDECTOMY N/A 09/21/2013   Procedure: APPENDECTOMY LAPAROSCOPIC;  Surgeon: Rolm Bookbinder, MD;  Location: [facility];  Service: General;  Laterality: N/A;   LAPAROSCOPIC CHOLECYSTECTOMY  2002   LAPAROSCOPY SIGMOID COLECTOMY  10-05-2008   diverticulitis   LOWER EXTREMITY ANGIOGRAPHY Left 10/14/2018   LOWER EXTREMITY ANGIOGRAPHY Bilateral 10/14/2018   Procedure: Lower Extremity Angiography;  Surgeon: Lorretta Harp, MD;  Location: [facility];  Service: Cardiovascular;  Laterality: Bilateral;  ILIACS   MICROLARYNGOSCOPY  09-27-2006   w/ true vocal cord stripping  and bilateral bx's of lesion's (benign)   PERIPHERAL VASCULAR INTERVENTION  10/14/2018   Procedure: PERIPHERAL VASCULAR INTERVENTION;  Surgeon:  Lorretta Harp, MD;  Location: [place] [facility];  Service: Cardiovascular;;   SINUS SURGERY WITH INSTATRAK  1991   TEMPOROMANDIBULAR JOINT SURGERY  1990   TONSILLECTOMY AND ADENOIDECTOMY  1964    Family History  Problem Relation Age of Onset   Pancreatic cancer Mother    Colon polyps Mother    Pancreatic cancer Father    Diabetes Father    Heart disease Father    Leukemia Father    Prostate cancer Father    Hypertension Father    Colon cancer Neg Hx    Esophageal cancer Neg Hx    Rectal cancer Neg Hx    Stomach cancer Neg Hx     Social History   Socioeconomic History   Marital status: Married    Spouse name: Not on file   Number of children: Not on file   Years of education: Not on file   Highest education level: Not on file  Occupational History   Not on file  Tobacco Use   Smoking status: Every Day    Packs/day: 0.75    Years: 47.00    Total pack years: 35.25    Types: Cigarettes   Smokeless tobacco: Never   Tobacco comments:    counseled about smoking cessation  Vaping Use   Vaping Use: Never used  Substance and Sexual Activity   Alcohol use: No    Alcohol/week: 0.0 standard drinks of alcohol   Drug use: No   Sexual activity: Not Currently    Birth control/protection: Post-menopausal  Other Topics Concern   Not on file  Social History Narrative   Not on file   Social Determinants of Health   Financial Resource Strain: Not on file  Food Insecurity: Not on file  Transportation Needs: Not on file  Physical Activity: Not on file  Stress: Not on file  Social Connections: Not on file  Intimate Partner Violence: Not on file    Outpatient Medications Prior to Visit  Medication Sig Dispense Refill   aspirin EC 81 MG EC tablet Take 1 tablet (81 mg total) by mouth daily.     atorvastatin (LIPITOR) 40 MG tablet TAKE 1 TABLET EVERY DAY (NEED MD APPOINTMENT FOR REFILLS) 90 tablet 0   clopidogrel (PLAVIX) 75 MG tablet TAKE 1 TABLET EVERY DAY 90 tablet 2    dicyclomine (BENTYL) 10 MG capsule TAKE 1 CAPSULE EVERY DAY AS NEEDED  FOR ABDOMINAL SPASMS 90 capsule 0   estradiol (CLIMARA - DOSED IN MG/24 HR) 0.1 mg/24hr patch Place 0.1 mg onto the skin every Monday.  0   Evolocumab (REPATHA SURECLICK) 235 MG/ML SOAJ INJECT 1 DOSE INTO THE SKIN EVERY 14 (FOURTEEN) DAYS. 2 mL 11   ezetimibe (ZETIA) 10 MG tablet Take 1 tablet (10 mg total) by mouth daily. 90 tablet 1   famotidine (PEPCID) 20 MG tablet TAKE 1 TABLET TWICE DAILY 180 tablet 3  fluticasone (FLONASE) 50 MCG/ACT nasal spray Place 2 sprays into both nostrils daily. 16 g 6   lisinopril (ZESTRIL) 20 MG tablet TAKE 1 TABLET EVERY DAY 90 tablet 1   LORazepam (ATIVAN) 0.5 MG tablet Take 0.5 mg by mouth at bedtime.   5   methocarbamol (ROBAXIN) 500 MG tablet Take 1 tablet (500 mg total) by mouth 4 (four) times daily. 45 tablet 1   pantoprazole (PROTONIX) 40 MG tablet TAKE 1 TABLET BY MOUTH EVERY DAY 90 tablet 1   polyethylene glycol (MIRALAX) 17 g packet Take 17 g by mouth 2 (two) times daily. 30 each 0   promethazine-dextromethorphan (PROMETHAZINE-DM) 6.25-15 MG/5ML syrup Take 5 mLs by mouth 4 (four) times daily as needed. 118 mL 0   sertraline (ZOLOFT) 50 MG tablet Take 25 mg by mouth at bedtime.      sucralfate (CARAFATE) 1 g tablet TAKE 1 TABLET BY MOUTH THREE TIMES A DAY 270 tablet 1   doxycycline (VIBRA-TABS) 100 MG tablet Take 1 tablet (100 mg total) by mouth 2 (two) times daily. (Patient not taking: Reported on 01/10/2022) 20 tablet 0   No facility-administered medications prior to visit.    Allergies  Allergen Reactions   Amoxicillin-Pot Clavulanate Shortness Of Breath, Itching and Swelling   Avelox [Moxifloxacin Hcl In Nacl] Swelling    Swelling, itching and shortness of breath   Ciprofloxacin Shortness Of Breath, Itching and Swelling   Keflex [Cephalexin] Shortness Of Breath and Swelling   Penicillins Shortness Of Breath, Itching and Swelling    Did it involve swelling of the  face/tongue/throat, SOB, or low BP? Yes Did it involve sudden or severe rash/hives, skin peeling, or any reaction on the inside of your mouth or nose? No Did you need to seek medical attention at a hospital or doctor's office? Yes When did it last happen?      Several years If all above answers are "NO", may proceed with cephalosporin use.    Sulfa Antibiotics Shortness Of Breath, Itching and Swelling   Prednisone Other (See Comments)    GI irritation; is able to tolerate injections   Acyclovir And Related Other (See Comments)    Pt does not recall this reaction   Clindamycin/Lincomycin Itching and Swelling   Levaquin [Levofloxacin] Hives   Zyvox [Linezolid] Diarrhea    Review of Systems  Constitutional:  Negative for fever and malaise/fatigue.       (+)decreased appetite  HENT:  Negative for congestion.   Eyes:  Negative for blurred vision.  Respiratory:  Negative for cough and shortness of breath.   Cardiovascular:  Negative for chest pain, palpitations and leg swelling.  Gastrointestinal:  Positive for abdominal pain (lower) and nausea. Negative for vomiting.  Musculoskeletal:  Negative for back pain.  Skin:  Negative for rash.  Neurological:  Negative for loss of consciousness and headaches.       Objective:    Physical Exam Vitals and nursing note reviewed.  Constitutional:      General: She is not in acute distress.    Appearance: Normal appearance. She is not ill-appearing.  HENT:     Head: Normocephalic and atraumatic.     Right Ear: External ear normal.     Left Ear: External ear normal.  Eyes:     Extraocular Movements: Extraocular movements intact.     Pupils: Pupils are equal, round, and reactive to light.  Cardiovascular:     Rate and Rhythm: Normal rate and regular rhythm.  Heart sounds: Normal heart sounds. No murmur heard.    No gallop.  Pulmonary:     Effort: Pulmonary effort is normal. No respiratory distress.     Breath sounds: Normal breath  sounds. No wheezing or rales.  Skin:    General: Skin is warm and dry.  Neurological:     Mental Status: She is alert and oriented to person, place, and time.  Psychiatric:        Judgment: Judgment normal.     BP 112/80 (BP Location: Left Arm, Patient Position: Sitting, Cuff Size: Normal)   Pulse 86   Temp 98 F (36.7 C) (Oral)   Resp 18   Ht '5\' 1"'$  (1.549 m)   Wt 102 lb 3.2 oz (46.4 kg)   SpO2 95%   BMI 19.31 kg/m  Wt Readings from Last 3 Encounters:  01/10/22 102 lb 3.2 oz (46.4 kg)  09/16/21 107 lb 6.4 oz (48.7 kg)  06/07/21 106 lb 9.6 oz (48.4 kg)    Diabetic Foot Exam - Simple   No data filed    Lab Results  Component Value Date   WBC 4.8 06/07/2021   HGB 13.2 06/07/2021   HCT 40.8 06/07/2021   PLT 204.0 06/07/2021   GLUCOSE 89 06/07/2021   CHOL 122 06/07/2021   TRIG 127.0 06/07/2021   HDL 39.30 06/07/2021   LDLDIRECT 151.0 11/07/2016   LDLCALC 57 06/07/2021   ALT 7 06/07/2021   AST 13 06/07/2021   NA 140 06/07/2021   K 4.3 06/07/2021   CL 104 06/07/2021   CREATININE 0.77 06/07/2021   BUN 10 06/07/2021   CO2 32 06/07/2021   TSH 2.07 06/07/2021   INR 1.0 05/21/2009   HGBA1C 6.1 12/18/2017   MICROALBUR <0.7 11/09/2014    Lab Results  Component Value Date   TSH 2.07 06/07/2021   Lab Results  Component Value Date   WBC 4.8 06/07/2021   HGB 13.2 06/07/2021   HCT 40.8 06/07/2021   MCV 92.4 06/07/2021   PLT 204.0 06/07/2021   Lab Results  Component Value Date   NA 140 06/07/2021   K 4.3 06/07/2021   CO2 32 06/07/2021   GLUCOSE 89 06/07/2021   BUN 10 06/07/2021   CREATININE 0.77 06/07/2021   BILITOT 0.3 06/07/2021   ALKPHOS 57 06/07/2021   AST 13 06/07/2021   ALT 7 06/07/2021   PROT 5.9 (L) 06/07/2021   ALBUMIN 3.8 06/07/2021   CALCIUM 8.5 06/07/2021   ANIONGAP 5 09/08/2019   GFR 76.75 06/07/2021   Lab Results  Component Value Date   CHOL 122 06/07/2021   Lab Results  Component Value Date   HDL 39.30 06/07/2021   Lab Results   Component Value Date   LDLCALC 57 06/07/2021   Lab Results  Component Value Date   TRIG 127.0 06/07/2021   Lab Results  Component Value Date   CHOLHDL 3 06/07/2021   Lab Results  Component Value Date   HGBA1C 6.1 12/18/2017       Assessment & Plan:   Problem List Items Addressed This Visit       Unprioritized   Abdominal pain - Primary   Relevant Orders   CT Abdomen Pelvis W Contrast   CBC with Differential/Platelet   Comprehensive metabolic panel   POCT Urinalysis Dipstick (Automated) (Completed)   Left lower quadrant pain    ? Diverticulitis  Vs adhesions Check labs and CT  To to ER if pain worsens  No orders of the defined types were placed in this encounter.   IAnn Held, DO, personally preformed the services described in this documentation.  All medical record entries made by the scribe were at my direction and in my presence.  I have reviewed the chart and discharge instructions (if applicable) and agree that the record reflects my personal performance and is accurate and complete. 01/10/2022   I,Leah [name],acting as a Education administrator for [employer], DO.,have documented all relevant documentation on the behalf of Leah Held, DO,as directed by  Leah Held, DO while in the presence of [place], [place]

## 2022-01-10 NOTE — Assessment & Plan Note (Signed)
?   Diverticulitis  Vs adhesions Check labs and CT  To to ER if pain worsens

## 2022-01-10 NOTE — Addendum Note (Signed)
Addended by: Kelle Darting A on: 01/10/2022 04:06 PM   Modules accepted: Orders

## 2022-01-12 ENCOUNTER — Ambulatory Visit (HOSPITAL_BASED_OUTPATIENT_CLINIC_OR_DEPARTMENT_OTHER)
Admission: RE | Admit: 2022-01-12 | Discharge: 2022-01-12 | Disposition: A | Discharge status: Home / Self Care | Payer: Medicare HMO | Source: Ambulatory Visit | Attending: Family Medicine | Admitting: Family Medicine

## 2022-01-12 ENCOUNTER — Encounter (HOSPITAL_BASED_OUTPATIENT_CLINIC_OR_DEPARTMENT_OTHER): Payer: Self-pay

## 2022-01-12 DIAGNOSIS — R1032 Left lower quadrant pain: Secondary | ICD-10-CM | POA: Insufficient documentation

## 2022-01-12 DIAGNOSIS — K7689 Other specified diseases of liver: Secondary | ICD-10-CM | POA: Diagnosis not present

## 2022-01-12 MED ORDER — IOHEXOL 300 MG/ML  SOLN
100.0000 mL | Freq: Once | INTRAMUSCULAR | Status: AC | PRN
  Administered 2022-01-12: 100 mL via INTRAVENOUS

## 2022-01-16 ENCOUNTER — Telehealth: Payer: Self-pay | Admitting: Family Medicine

## 2022-01-16 NOTE — Telephone Encounter (Signed)
Pt called to go over labs over the phone. She is needing some questions answered.

## 2022-01-18 NOTE — Telephone Encounter (Signed)
Pt called. LVM to return call 

## 2022-01-18 NOTE — Telephone Encounter (Signed)
Pt called back , spoke with pt about labs

## 2022-02-01 ENCOUNTER — Ambulatory Visit: Payer: Medicare HMO | Admitting: Physician Assistant

## 2022-03-10 ENCOUNTER — Ambulatory Visit: Payer: Medicare HMO | Admitting: Gastroenterology

## 2022-03-10 ENCOUNTER — Encounter: Payer: Self-pay | Admitting: Gastroenterology

## 2022-03-10 VITALS — BP 120/80 | HR 72 | Ht 60.25 in | Wt 99.4 lb

## 2022-03-10 DIAGNOSIS — K5909 Other constipation: Secondary | ICD-10-CM | POA: Diagnosis not present

## 2022-03-10 DIAGNOSIS — R110 Nausea: Secondary | ICD-10-CM

## 2022-03-10 DIAGNOSIS — R634 Abnormal weight loss: Secondary | ICD-10-CM

## 2022-03-10 DIAGNOSIS — R140 Abdominal distension (gaseous): Secondary | ICD-10-CM | POA: Diagnosis not present

## 2022-03-10 DIAGNOSIS — R6881 Early satiety: Secondary | ICD-10-CM

## 2022-03-10 MED ORDER — ONDANSETRON HCL 4 MG PO TABS: 4.0000 mg | ORAL_TABLET | Freq: Four times a day (QID) | ORAL | 2 refills | Status: DC | PRN

## 2022-03-10 NOTE — Progress Notes (Signed)
[facility] GI Progress Note  Chief Complaint: Abdominal pain, bloating nausea and dysphagia  Subjective  History: From my July 2022 office note: ""History of chronic abdominal pain and constipation.  Previous sigmoid resection for diverticulitis, poor preparation on most recent colonoscopy.  Has suspected decreased motility related to previous sigmoid resection, also atypical anastomosis anatomy with tortuosity and additional left colon redundancy.  Multiple prior abdominal pelvic surgeries and previous SBO that did not require further surgery.  Her previous sigmoid resection was reportedly complicated by prolonged abdominal wound healing. Seen in clinic February 4 with about 2 weeks of acute on chronic constipation and escalating generalized abdominal pain.  CT scan showed partial small bowel obstruction. Hospitalized several days, no NG tube drainage or further surgery required.  Colonoscopy performed, similar findings before, also still poor preparation with patient unable to tolerate total PEG prep" ______________ Recurrence of esophageal dysphagia, related to distal esophageal tortuosity from hiatal hernia and possible stricture.  The more proximal dysphagia in the neck is related to her cervical spine condition. Epigastric pain somewhat more difficult to characterize.  She has had a previous cholecystectomy.  Neither she nor I think it is reminiscent of her previous PSBO symptoms. Possibly enlarging hiatal hernia.   Incidental abdominal bruit heard on exam in a patient with known vascular disease. Her symptoms do not sound like nonocclusive mesenteric ischemia." ________  Findings from EGD August 2022:: The distal esophagus was tortuous due to the hiatal hernia. - A 2 cm hiatal hernia was present. - One benign-appearing, intrinsic moderate stenosis was found at the gastroesophageal junction. This stenosis measured 1.2 cm (inner diameter) x less than one cm (in length).  The stenosis was traversed. A TTS dilator was passed through the scope. Dilation with a 16-17- 18 mm balloon dilator was performed to 18 mm. The dilation site was examined and showed mild mucosal disruption and mild improvement in luminal narrowing. - The stomach was normal. - The cardia and gastric fundus were normal on retroflexion. - The examined duodenum was normal.  I felt the dysphagia was related to combination of tortuous distal esophagus, hiatal hernia and stricture which was balloon dilated.  _________________________________________   Wells Guiles has ongoing abdominal pain that is usually upper and sometimes more left upper quadrant.  She has ongoing dysphagia primarily to large tablets.  She was prescribed Carafate and says she cannot get them down, so I recommended she stop it.  She has ongoing complaints of early satiety bloating and intermittent nausea with decreased appetite.  Some days she just does not feel like eating, and reports being down 22 pounds in the last 2 years.  She is going through a lot of stress with a daughter that has required vascular surgery and a husband who is currently under treatment for liver cancer.  Unfortunately she continues to smoke, though not as much as years prior. She currently does not have her take any medicines to control nausea.  ROS: Cardiovascular:  no chest pain Respiratory: Dyspnea with exertion, intermittent cough Poor sleep Anxiety Remainder of systems negative except as above The patient's Past Medical, Family and Social History were reviewed and are on file in the EMR. Past Medical History:  Diagnosis Date   Allergy    Anemia    Anxiety    Barrett esophagus    Bladder pain    Cataract    Chronic bronchitis ([place])    per pt last episode july 2016   Complication of anesthesia  slow to wake   COPD (chronic obstructive pulmonary disease) (HCC)    Depression    Diverticulosis    Frequency of urination    GERD  (gastroesophageal reflux disease)    Hiatal hernia    History of adenomatous polyp of colon    tubular adenoma   History of chronic gastritis    History of diverticulitis of colon    History of esophageal dilatation    for stricture   History of esophageal spasm    Hyperlipidemia    Hypertension    IBS (irritable bowel syndrome)    Interstitial cystitis    PAD (peripheral artery disease) (HCC)    Productive cough    intermittant   Smokers' cough ([place])    Urgency of urination     Objective:  Med list reviewed  Current Outpatient Medications:    aspirin EC 81 MG EC tablet, Take 1 tablet (81 mg total) by mouth daily., Disp: , Rfl:    atorvastatin (LIPITOR) 40 MG tablet, TAKE 1 TABLET EVERY DAY (NEED MD APPOINTMENT FOR REFILLS), Disp: 90 tablet, Rfl: 0   clopidogrel (PLAVIX) 75 MG tablet, TAKE 1 TABLET EVERY DAY, Disp: 90 tablet, Rfl: 2   dicyclomine (BENTYL) 10 MG capsule, TAKE 1 CAPSULE EVERY DAY AS NEEDED  FOR ABDOMINAL SPASMS, Disp: 90 capsule, Rfl: 0   estradiol (CLIMARA - DOSED IN MG/24 HR) 0.1 mg/24hr patch, Place 0.1 mg onto the skin every Monday., Disp: , Rfl: 0   Evolocumab (REPATHA SURECLICK) 789 MG/ML SOAJ, INJECT 1 DOSE INTO THE SKIN EVERY 14 (FOURTEEN) DAYS., Disp: 2 mL, Rfl: 11   ezetimibe (ZETIA) 10 MG tablet, Take 1 tablet (10 mg total) by mouth daily., Disp: 90 tablet, Rfl: 1   famotidine (PEPCID) 20 MG tablet, TAKE 1 TABLET TWICE DAILY, Disp: 180 tablet, Rfl: 3   fluticasone (FLONASE) 50 MCG/ACT nasal spray, Place 2 sprays into both nostrils daily., Disp: 16 g, Rfl: 6   lisinopril (ZESTRIL) 20 MG tablet, TAKE 1 TABLET EVERY DAY, Disp: 90 tablet, Rfl: 1   LORazepam (ATIVAN) 0.5 MG tablet, Take 0.5 mg by mouth at bedtime. , Disp: , Rfl: 5   methocarbamol (ROBAXIN) 500 MG tablet, Take 1 tablet (500 mg total) by mouth 4 (four) times daily., Disp: 45 tablet, Rfl: 1   ondansetron (ZOFRAN) 4 MG tablet, Take 1 tablet (4 mg total) by mouth every 6 (six) hours as needed for  nausea or vomiting., Disp: 45 tablet, Rfl: 2   polyethylene glycol (MIRALAX) 17 g packet, Take 17 g by mouth 2 (two) times daily., Disp: 30 each, Rfl: 0   promethazine-dextromethorphan (PROMETHAZINE-DM) 6.25-15 MG/5ML syrup, Take 5 mLs by mouth 4 (four) times daily as needed., Disp: 118 mL, Rfl: 0   sertraline (ZOLOFT) 50 MG tablet, Take 25 mg by mouth at bedtime. , Disp: , Rfl:    pantoprazole (PROTONIX) 40 MG tablet, TAKE 1 TABLET BY MOUTH EVERY DAY (Patient not taking: Reported on 03/10/2022), Disp: 90 tablet, Rfl: 1   sucralfate (CARAFATE) 1 g tablet, TAKE 1 TABLET BY MOUTH THREE TIMES A DAY (Patient not taking: Reported on 03/10/2022), Disp: 270 tablet, Rfl: 1   Vital signs in last 24 hrs: Vitals:   03/10/22 1033  BP: 120/80  Pulse: 72   Wt Readings from Last 3 Encounters:  03/10/22 99 lb 6 oz (45.1 kg)  01/10/22 102 lb 3.2 oz (46.4 kg)  09/16/21 107 lb 6.4 oz (48.7 kg)    Physical Exam  Chronically ill-appearing,  ambulatory, breathing comfortably on room air, gets on exam table without assistance.  Poor muscle mass HEENT: sclera anicteric, oral mucosa moist without lesions Neck: supple, no thyromegaly, JVD or lymphadenopathy Cardiac: Regular without murmur,  no peripheral edema Pulm: Globally decreased breath sounds bilaterally, normal RR and effort noted Abdomen: soft, epigastric tenderness, with active bowel sounds. No guarding or palpable hepatosplenomegaly. Skin; warm and dry, no jaundice or rash  Labs:   ___________________________________________ Radiologic studies: CLINICAL DATA:  76 year old female with abdominal bruit, history of peripheral artery disease.   EXAM: CT ANGIOGRAPHY ABDOMEN   TECHNIQUE: Multidetector CT imaging of the abdomen was performed using the standard protocol during bolus administration of intravenous contrast. Multiplanar reconstructed images and MIPs were obtained and reviewed to evaluate the vascular anatomy.   CONTRAST:  112m  OMNIPAQUE IOHEXOL 350 MG/ML SOLN   COMPARISON:  09/04/2019   FINDINGS: VASCULAR   Aorta: New complete circumferential fibrofatty and calcific atherosclerotic changes throughout the abdominal aorta which is normal in caliber and patent throughout.   Celiac: Patent without evidence of aneurysm, dissection, vasculitis or significant stenosis.   SMA: Patent without evidence of aneurysm, dissection, vasculitis or significant stenosis.   Renals: Dual bilateral renal arteries are patent without evidence of aneurysm, dissection, vasculitis, fibromuscular dysplasia or significant stenosis.   IMA: Patent without evidence of aneurysm, dissection, vasculitis or significant stenosis.   Inflow: Severe proximal stenosis of the right common iliac artery secondary to prominent calcific atherosclerotic plaque. Indwelling left common iliac artery stent appears patent.   Veins: No obvious venous abnormality within the limitations of this arterial phase study.   Review of the MIP images confirms the above findings.   NON-VASCULAR   Lower chest: No acute abnormality.   Hepatobiliary: Unchanged simple hepatic cyst measuring up to 2.1 cm in the hilar aspect of the right lobe. Additional subcentimeter hypoechoic lesion in segment 5, too small to characterize by CT though unchanged from comparison and likely also representing a simple hepatic cyst. The liver is otherwise normal in size, contour, and attenuation. The gallbladder surgically absent. No intra or extrahepatic biliary ductal dilation.   Pancreas: Unremarkable. No pancreatic ductal dilatation or surrounding inflammatory changes.   Spleen: Interval development of a subcapsular cyst about the inferior pole of the spleen which measures up to 1.7 cm. The spleen is otherwise normal in size without focal lesion.   Adrenals/Urinary Tract: Adrenal glands are unremarkable. Kidneys are normal, without renal calculi, focal lesion, or  hydronephrosis.   Stomach/Bowel: Stomach is within normal limits. No evidence of bowel wall thickening, distention, or inflammatory changes.   Lymphatic: No abdominal lymphadenopathy.   Other: No abdominal wall hernia or abnormality. No ascites.   Musculoskeletal: No acute or significant osseous findings.   IMPRESSION: VASCULAR   1. Moderate fibrofatty and calcific atherosclerotic changes of the abdominal aorta without evidence of aneurysm. 2. Severe focal stenosis of the proximal right common iliac artery secondary to atherosclerotic plaque. Recommend correlation with forthcoming ankle-brachial indices exam. 3. The proximal, visualized portion of the indwelling left common iliac stent is patent.   NON-VASCULAR   No acute intra-abdominal abnormality.   DRuthann Cancer MD   [facility]   [facility]     Electronically Signed   By: DRuthann CancerMD   On: 03/05/2021 10:32  (Results conveyed to this patient's cardiologist Dr. BGwenlyn Foundafter report was received) __________________________    CLINICAL DATA:  Patient c/o LLQ abdominal pains, hx of 12" colon removed, hx  of IBS, interstitial cystitis, HTN, chronic gastritis, GERD, hiatal hernia, diverticulosis, hysterectomy, no other complaints   EXAM: CT ABDOMEN AND PELVIS WITH CONTRAST   TECHNIQUE: Multidetector CT imaging of the abdomen and pelvis was performed using the standard protocol following bolus administration of intravenous contrast.   RADIATION DOSE REDUCTION: This exam was performed according to the departmental dose-optimization program which includes automated exposure control, adjustment of the mA and/or kV according to patient size and/or use of iterative reconstruction technique.   CONTRAST:  141m OMNIPAQUE IOHEXOL 300 MG/ML  SOLN   COMPARISON:  None Available.   FINDINGS: Lower chest: Lung bases are clear.   Hepatobiliary: No focal hepatic  lesion. Postcholecystectomy. No biliary dilatation. Benign cyst in the RIGHT hepatic lobe.   Pancreas: Pancreas is normal. No ductal dilatation. No pancreatic inflammation.   Spleen: benign cyst in the anterior margin of the spleen.   Adrenals/urinary tract: Adrenal glands and kidneys are normal. The ureters and bladder normal.   Stomach/Bowel: Stomach, small-bowel and cecum normal. Post appendectomy. Moderate volume stool throughout the colon. Prior sigmoid colectomy. NO complicating features. Rectum normal.   Vascular/Lymphatic: Abdominal aorta is normal caliber with atherosclerotic calcification. There is no retroperitoneal or periportal lymphadenopathy. No pelvic lymphadenopathy. Vascular stent in the LEFT iliac artery   Reproductive: Post hysterectomy.  Adnexa unremarkable   Other: No free fluid.   Musculoskeletal: No aggressive osseous lesion.   IMPRESSION: 1. No acute findings abdomen pelvis. 2. Post partial sigmoid colectomy without complicating features. 3. Post appendectomy and cholecystectomy as well as hysterectomy. 4.  Aortic Atherosclerosis (ICD10-I70.0).     Electronically Signed   By: SSuzy BouchardM.D.   On: 01/12/2022 11:16   ___________________________  Last EKG 10/29/2020: NSR, no ischemic changes, normal QTc   ____________________________________________ Other:   _____________________________________________ Assessment & Plan  Assessment: Encounter Diagnoses  Name Primary?   Early satiety Yes   Nausea without vomiting    Bloating    Weight loss    Chronic constipation    Multiple chronic digestive symptoms that I feel are largely due to issues and postsurgically altered anatomy with suspected intra-abdominal adhesions, hiatal hernia and superimposed motility problems.  No evidence of obstruction, mass, areas of enteritis or colitis, and no mesenteric artery stenosis on CTA last year.  In addition, her symptoms are not typical for  NOMI. No diarrhea or radiographic evidence of chronic pancreatitis.  Plan: Gastric emptying study  As needed ondansetron  Stop Carafate  She no longer has a prescription or is using Phenergan  No further endoscopic testing planned at present.  Regarding her loss of appetite and slow weight loss, perhaps a medicine like Remeron may be helpful to help her sleep and stimulate her appetite.  I will copy my note to primary care for their thoughts on it.  32 minutes were spent on this encounter (including chart review, history/exam, counseling/coordination of care, and documentation) > 50% of that time was spent on counseling and coordination of care.   HNelida MeuseIII

## 2022-03-10 NOTE — Patient Instructions (Signed)
_______________________________________________________  If you are age 74 or older, your body mass index should be between 23-30. Your Body mass index is 19.25 kg/m. If this is out of the aforementioned range listed, please consider follow up with your Primary Care Provider.  If you are age 39 or younger, your body mass index should be between 19-25. Your Body mass index is 19.25 kg/m. If this is out of the aformentioned range listed, please consider follow up with your Primary Care Provider.   ________________________________________________________  The  GI providers would like to encourage you to use [facility] to communicate with providers for non-urgent requests or questions.  Due to long hold times on the telephone, sending your provider a message by [facility] may be a faster and more efficient way to get a response.  Please allow 48 business hours for a response.  Please remember that this is for non-urgent requests.  _______________________________________________________  Leah [name] have been scheduled for a gastric emptying scan at [facility] on 03/16/22 at 8:00 am. Please arrive at least 15 minutes prior to your appointment for registration. Please make certain not to have anything to eat or drink after midnight the night before your test. Hold all stomach medications (ex: Zofran, phenergan, Reglan) 48 hours prior to your test. If you need to reschedule your appointment, please contact radiology scheduling at [phone]. _____________________________________________________________________ A gastric-emptying study measures how long it takes for food to move through your stomach. There are several ways to measure stomach emptying. In the most common test, you eat food that contains a small amount of radioactive material. A scanner that detects the movement of the radioactive material is placed over your abdomen to monitor the rate at which food leaves your stomach. This test normally  takes about 4 hours to complete. _____________________________________________________________________  We have sent the following medications to your pharmacy for you to pick up at your convenience: Zofran  Due to recent changes in healthcare laws, you may see the results of your imaging and laboratory studies on MyChart before your provider has had a chance to review them.  We understand that in some cases there may be results that are confusing or concerning to you. Not all laboratory results come back in the same time frame and the provider may be waiting for multiple results in order to interpret others.  Please give [place] 48 hours in order for your provider to thoroughly review all the results before contacting the office for clarification of your results.   It was a pleasure to see you today!  Thank you for trusting me with your gastrointestinal care!

## 2022-03-13 ENCOUNTER — Telehealth: Payer: Self-pay | Admitting: Internal Medicine

## 2022-03-13 ENCOUNTER — Other Ambulatory Visit: Payer: Self-pay

## 2022-03-13 DIAGNOSIS — Z95828 Presence of other vascular implants and grafts: Secondary | ICD-10-CM

## 2022-03-13 DIAGNOSIS — I739 Peripheral vascular disease, unspecified: Secondary | ICD-10-CM

## 2022-03-13 NOTE — Telephone Encounter (Signed)
Pt called stating that she cannot make her appt on tomorrow 03/14/22. Pt would like to reschedule however, order expires tomorrow.

## 2022-03-13 NOTE — Telephone Encounter (Signed)
Replaced order- she can be rescheduled.  Thanks!

## 2022-03-14 ENCOUNTER — Ambulatory Visit (HOSPITAL_COMMUNITY)
Admission: RE | Admit: 2022-03-14 | Payer: Medicare HMO | Source: Ambulatory Visit | Attending: Cardiovascular Disease | Admitting: Cardiovascular Disease

## 2022-03-14 ENCOUNTER — Ambulatory Visit (HOSPITAL_COMMUNITY): Payer: Medicare HMO

## 2022-03-16 ENCOUNTER — Encounter (HOSPITAL_COMMUNITY)
Admission: RE | Admit: 2022-03-16 | Discharge: 2022-03-16 | Disposition: A | Discharge status: Home / Self Care | Payer: Medicare HMO | Source: Ambulatory Visit | Attending: Gastroenterology | Admitting: Gastroenterology

## 2022-03-16 DIAGNOSIS — R110 Nausea: Secondary | ICD-10-CM

## 2022-03-16 DIAGNOSIS — R6881 Early satiety: Secondary | ICD-10-CM | POA: Diagnosis not present

## 2022-03-16 DIAGNOSIS — R112 Nausea with vomiting, unspecified: Secondary | ICD-10-CM | POA: Diagnosis not present

## 2022-03-16 DIAGNOSIS — R140 Abdominal distension (gaseous): Secondary | ICD-10-CM | POA: Diagnosis not present

## 2022-03-16 MED ORDER — TECHNETIUM TC 99M SULFUR COLLOID
2.1000 | Freq: Once | INTRAVENOUS | Status: AC | PRN
  Administered 2022-03-16: 2.1 via ORAL

## 2022-03-22 ENCOUNTER — Other Ambulatory Visit (HOSPITAL_COMMUNITY): Payer: Self-pay | Admitting: Cardiovascular Disease

## 2022-03-22 ENCOUNTER — Ambulatory Visit (HOSPITAL_COMMUNITY)
Admission: RE | Admit: 2022-03-22 | Discharge: 2022-03-22 | Disposition: A | Discharge status: Home / Self Care | Payer: Medicare HMO | Source: Ambulatory Visit | Attending: Cardiovascular Disease | Admitting: Cardiovascular Disease

## 2022-03-22 ENCOUNTER — Ambulatory Visit (HOSPITAL_BASED_OUTPATIENT_CLINIC_OR_DEPARTMENT_OTHER)
Admission: RE | Admit: 2022-03-22 | Discharge: 2022-03-22 | Disposition: A | Discharge status: Home / Self Care | Payer: Medicare HMO | Source: Ambulatory Visit | Attending: Cardiovascular Disease | Admitting: Cardiovascular Disease

## 2022-03-22 DIAGNOSIS — I739 Peripheral vascular disease, unspecified: Secondary | ICD-10-CM | POA: Insufficient documentation

## 2022-03-22 DIAGNOSIS — Z95828 Presence of other vascular implants and grafts: Secondary | ICD-10-CM | POA: Insufficient documentation

## 2022-03-24 ENCOUNTER — Encounter: Payer: Self-pay | Admitting: Family Medicine

## 2022-03-24 ENCOUNTER — Ambulatory Visit (INDEPENDENT_AMBULATORY_CARE_PROVIDER_SITE_OTHER): Payer: Medicare HMO | Admitting: Family Medicine

## 2022-03-24 VITALS — BP 132/80 | HR 72 | Temp 97.6°F | Resp 18 | Ht 60.5 in | Wt 98.2 lb

## 2022-03-24 DIAGNOSIS — R1013 Epigastric pain: Secondary | ICD-10-CM

## 2022-03-24 DIAGNOSIS — R634 Abnormal weight loss: Secondary | ICD-10-CM | POA: Diagnosis not present

## 2022-03-24 DIAGNOSIS — J449 Chronic obstructive pulmonary disease, unspecified: Secondary | ICD-10-CM

## 2022-03-24 DIAGNOSIS — K219 Gastro-esophageal reflux disease without esophagitis: Secondary | ICD-10-CM

## 2022-03-24 LAB — CBC WITH DIFFERENTIAL/PLATELET
Basophils Absolute: 0.1 10*3/uL (ref 0.0–0.1)
Basophils Relative: 1.7 % (ref 0.0–3.0)
Eosinophils Absolute: 0.3 10*3/uL (ref 0.0–0.7)
Eosinophils Relative: 5.4 % — ABNORMAL HIGH (ref 0.0–5.0)
HCT: 42.3 % (ref 36.0–46.0)
Hemoglobin: 14 g/dL (ref 12.0–15.0)
Lymphocytes Relative: 27.3 % (ref 12.0–46.0)
Lymphs Abs: 1.4 10*3/uL (ref 0.7–4.0)
MCHC: 33.1 g/dL (ref 30.0–36.0)
MCV: 93.1 fl (ref 78.0–100.0)
Monocytes Absolute: 0.4 10*3/uL (ref 0.1–1.0)
Monocytes Relative: 7.8 % (ref 3.0–12.0)
Neutro Abs: 3 10*3/uL (ref 1.4–7.7)
Neutrophils Relative %: 57.8 % (ref 43.0–77.0)
Platelets: 181 10*3/uL (ref 150.0–400.0)
RBC: 4.55 Mil/uL (ref 3.87–5.11)
RDW: 13.1 % (ref 11.5–15.5)
WBC: 5.2 10*3/uL (ref 4.0–10.5)

## 2022-03-24 LAB — COMPREHENSIVE METABOLIC PANEL
ALT: 8 U/L (ref 0–35)
AST: 13 U/L (ref 0–37)
Albumin: 4.3 g/dL (ref 3.5–5.2)
Alkaline Phosphatase: 62 U/L (ref 39–117)
BUN: 11 mg/dL (ref 6–23)
CO2: 32 mEq/L (ref 19–32)
Calcium: 8.9 mg/dL (ref 8.4–10.5)
Chloride: 102 mEq/L (ref 96–112)
Creatinine, Ser: 0.87 mg/dL (ref 0.40–1.20)
GFR: 65.92 mL/min (ref >60.00)
Glucose, Bld: 83 mg/dL (ref 70–99)
Potassium: 4 mEq/L (ref 3.5–5.1)
Sodium: 137 mEq/L (ref 135–145)
Total Bilirubin: 0.4 mg/dL (ref 0.2–1.2)
Total Protein: 6.5 g/dL (ref 6.0–8.3)

## 2022-03-24 LAB — LIPASE: Lipase: 38 U/L (ref 11.0–59.0)

## 2022-03-24 LAB — AMYLASE: Amylase: 27 U/L (ref 27–131)

## 2022-03-24 LAB — H. PYLORI ANTIBODY, IGG: H Pylori IgG: NEGATIVE

## 2022-03-24 MED ORDER — PANTOPRAZOLE SODIUM 40 MG PO TBEC: 40.0000 mg | DELAYED_RELEASE_TABLET | Freq: Every day | ORAL | 1 refills | Status: DC

## 2022-03-24 NOTE — Assessment & Plan Note (Addendum)
Restart protonix  Will discuss with GI for next steps Check labs May have surgery see her for other options as well

## 2022-03-24 NOTE — Patient Instructions (Signed)

## 2022-03-24 NOTE — Assessment & Plan Note (Signed)
Due to abd pain  She states she wants to eat but unable to due to pain Ensure given to pt with coupons

## 2022-03-24 NOTE — Progress Notes (Signed)
Established Patient Office Visit  Subjective   Patient ID: Leah [name], female    DOB: [birth year]  Age: 76 y.o. MRN: [id number]  Chief Complaint  Patient presents with   GI Problem   Follow-up    HPI Pt is here c/o upper abd pain that has been going on a while.  She saw GI and barium swallow was done--- normal  Ct normal  Pt still c/o pain and will not eat because of it.  She is also losing weight  She is not takung the protonix because she thinks she just ran out She had surgery for sbo in 2010 and she has adhesions   Patient Active Problem List   Diagnosis Date Noted   Weight loss 03/24/2022   Acute cough 06/28/2021   Acute pain of right shoulder 09/07/2020   Cold intolerance 09/07/2020   SBO (small bowel obstruction) ([place]) 09/19/2019   Current every day smoker 09/19/2019   Diverticulitis 09/19/2019   Partial small bowel obstruction ([place]) 09/04/2019   Preventative health care 08/27/2019   Family history of coronary artery disease 02/17/2019   Claudication in peripheral vascular disease ([place]) 10/14/2018   Dyspnea 09/03/2018   Calcification of native coronary artery 09/03/2018   PAD (peripheral artery disease) ([place]) 09/03/2018   History of esophageal stricture 06/19/2018   Rhinitis, chronic 04/23/2017   Small bowel obstruction due to adhesions ([place]) 04/08/2016   Frozen shoulder 04/27/2015   Hematoma 09/07/2014   Cervical spondylosis with myelopathy 08/18/2014   Cervical spondylosis with radiculopathy 08/18/2014   Neck pain 05/19/2014   Fatigue 05/08/2014   Back pain 05/08/2014   Pain in joint, upper arm 05/08/2014   LLQ abdominal pain 03/02/2014   History of diverticulitis 03/02/2014   COPD exacerbation ([place]) 01/28/2014   Oral thrush 01/28/2014   Influenza 10/27/2013   Appendicitis 09/19/2013   Abdominal pain, left lower quadrant 03/05/2013   HTN (hypertension) 02/07/2013   Sinusitis, acute 01/15/2013   COPD GOLD II 01/15/2013   Smoker 12/20/2012    Epigastric pain 09/03/2012   Left lower quadrant pain 01/24/2011   Leg pain, left 06/07/2009   ROSACEA 04/16/2009   B12 DEFICIENCY 04/09/2009   ANEMIA, IRON DEFICIENCY 03/19/2009   MALAISE AND FATIGUE 03/19/2009   NECK PAIN, LEFT 01/11/2009   URI 07/30/2008   Constipation 05/26/2008   NAUSEA AND VOMITING 05/26/2008   Abdominal pain 05/26/2008   Claudication ([place]) 04/27/2008   VOMITING 04/17/2008   Dysphagia 04/17/2008   ANXIETY DEPRESSION 03/12/2008   TREMOR 03/12/2008   Dyslipidemia 11/13/2007   DEPRESSION 11/13/2007   ESOPHAGEAL SPASM 11/13/2007   GERD (gastroesophageal reflux disease) 11/13/2007   IBS 11/13/2007   CARPAL TUNNEL SYNDROME, HX OF 11/13/2007   Diverticulosis of colon (without mention of hemorrhage) 03/14/2007   GASTRITIS, CHRONIC 04/10/2002   HIATAL HERNIA 04/10/2002   Past Medical History:  Diagnosis Date   Allergy    Anemia    Anxiety    Barrett esophagus    Bladder pain    Cataract    Chronic bronchitis ([place])    per pt last episode july 2016   Complication of anesthesia    slow to wake   COPD (chronic obstructive pulmonary disease) ([place])    Depression    Diverticulosis    Frequency of urination    GERD (gastroesophageal reflux disease)    Hiatal hernia    History of adenomatous polyp of colon    tubular adenoma   History of chronic gastritis  History of diverticulitis of colon    History of esophageal dilatation    for stricture   History of esophageal spasm    Hyperlipidemia    Hypertension    IBS (irritable bowel syndrome)    Interstitial cystitis    PAD (peripheral artery disease) ([facility])    Productive cough    intermittant   Smokers' cough [facility])    Urgency of urination    Past Surgical History:  Procedure Laterality Date   ABDOMINAL AORTOGRAM N/A 10/14/2018   Procedure: ABDOMINAL AORTOGRAM;  Surgeon: Lorretta Harp, MD;  Location: [facility];  Service: Cardiovascular;  Laterality: N/A;   ABDOMINAL HYSTERECTOMY  1975    ANTERIOR CERVICAL DECOMP/DISCECTOMY FUSION N/A 09/07/2014   Procedure: Exploration of Fusion and Removal of Anterior Cervical Hematoma;  Surgeon: Newman Pies, MD;  Location: [facility];  Service: Neurosurgery;  Laterality: N/A;   CARPAL TUNNEL RELEASE Right 1993   COLONOSCOPY  last one 04-30-2013   COLONOSCOPY WITH PROPOFOL N/A 09/09/2019   Procedure: COLONOSCOPY WITH PROPOFOL;  Surgeon: Doran Stabler, MD;  Location: [facility];  Service: Gastroenterology;  Laterality: N/A;   CYSTO WITH HYDRODISTENSION N/A 06/08/2015   Procedure: CYSTOSCOPY/HYDRODISTENSION INSTILLATION OF MARCAINE AND PYRIDIUM;  Surgeon: Irine Seal, MD;  Location[facility];  Service: Urology;  Laterality: N/A;   CYSTO/  HOD/  BLADDER BX  1990's   ESOPHAGOGASTRODUODENOSCOPY  last one 09-11-2012   LAPAROSCOPIC APPENDECTOMY N/A 09/21/2013   Procedure: APPENDECTOMY LAPAROSCOPIC;  Surgeon: Rolm Bookbinder, MD;  Location: [facility];  Service: General;  Laterality: N/A;   LAPAROSCOPIC CHOLECYSTECTOMY  2002   LAPAROSCOPY SIGMOID COLECTOMY  10-05-2008   diverticulitis   LOWER EXTREMITY ANGIOGRAPHY Left 10/14/2018   LOWER EXTREMITY ANGIOGRAPHY Bilateral 10/14/2018   Procedure: Lower Extremity Angiography;  Surgeon: Lorretta Harp, MD;  Location: [facility];  Service: Cardiovascular;  Laterality: Bilateral;  ILIACS   MICROLARYNGOSCOPY  09-27-2006   w/ true vocal cord stripping  and bilateral bx's of lesion's (benign)   PERIPHERAL VASCULAR INTERVENTION  10/14/2018   Procedure: PERIPHERAL VASCULAR INTERVENTION;  Surgeon: Lorretta Harp, MD;  Location: [facility];  Service: Cardiovascular;;   SINUS SURGERY WITH INSTATRAK  1991   TEMPOROMANDIBULAR JOINT SURGERY  1990   TONSILLECTOMY AND ADENOIDECTOMY  1964   Social History   Tobacco Use   Smoking status: Every Day    Packs/day: 0.75    Years: 47.00    Total pack years: 35.25    Types: Cigarettes   Smokeless tobacco: Never   Tobacco  comments:    counseled about smoking cessation  Vaping Use   Vaping Use: Never used  Substance Use Topics   Alcohol use: No    Alcohol/week: 0.0 standard drinks of alcohol   Drug use: No   Social History   Socioeconomic History   Marital status: Married    Spouse name: Not on file   Number of children: 3   Years of education: Not on file   Highest education level: Not on file  Occupational History   Not on file  Tobacco Use   Smoking status: Every Day    Packs/day: 0.75    Years: 47.00    Total pack years: 35.25    Types: Cigarettes   Smokeless tobacco: Never   Tobacco comments:    counseled about smoking cessation  Vaping Use   Vaping Use: Never used  Substance and Sexual Activity   Alcohol use: No  Alcohol/week: 0.0 standard drinks of alcohol   Drug use: No   Sexual activity: Not Currently    Birth control/protection: Post-menopausal  Other Topics Concern   Not on file  Social History Narrative   Not on file   Social Determinants of Health   Financial Resource Strain: Not on file  Food Insecurity: Not on file  Transportation Needs: Not on file  Physical Activity: Not on file  Stress: Not on file  Social Connections: Not on file  Intimate Partner Violence: Not on file   Family Status  Relation Name Status   Mother  Deceased   Father  Deceased   Sister  Alive   Brother  Alive   MGM  Deceased   MGF  Deceased   PGM  Deceased   PGF  Deceased   Daughter  Alive   Daughter  Alive   Son  Alive   Neg Hx  (Not Specified)   Family History  Problem Relation Age of Onset   Pancreatic cancer Mother    Colon polyps Mother    Pancreatic cancer Father    Diabetes Father    Heart disease Father    Leukemia Father    Prostate cancer Father    Hypertension Father    Heart disease Brother    Heart attack Brother    Other Daughter        prediabetes   Heart disease Daughter    Diabetes Daughter    Colon cancer Neg Hx    Esophageal cancer Neg Hx     Rectal cancer Neg Hx    Stomach cancer Neg Hx    Allergies  Allergen Reactions   Amoxicillin-Pot Clavulanate Shortness Of Breath, Itching and Swelling   Avelox [Moxifloxacin Hcl In Nacl] Swelling    Swelling, itching and shortness of breath   Ciprofloxacin Shortness Of Breath, Itching and Swelling   Keflex [Cephalexin] Shortness Of Breath and Swelling   Penicillins Shortness Of Breath, Itching and Swelling    Did it involve swelling of the face/tongue/throat, SOB, or low BP? Yes Did it involve sudden or severe rash/hives, skin peeling, or any reaction on the inside of your mouth or nose? No Did you need to seek medical attention at a hospital or doctor's office? Yes When did it last happen?      Several years If all above answers are "NO", may proceed with cephalosporin use.    Sulfa Antibiotics Shortness Of Breath, Itching and Swelling   Prednisone Other (See Comments)    GI irritation; is able to tolerate injections   Acyclovir And Related Other (See Comments)    Pt does not recall this reaction   Clindamycin/Lincomycin Itching and Swelling   Levaquin [Levofloxacin] Hives   Zyvox [Linezolid] Diarrhea      Review of Systems  Constitutional:  Negative for fever and malaise/fatigue.  HENT:  Negative for congestion.   Eyes:  Negative for blurred vision.  Respiratory:  Negative for cough and shortness of breath.   Cardiovascular:  Negative for chest pain, palpitations and leg swelling.  Gastrointestinal:  Negative for vomiting.  Musculoskeletal:  Negative for back pain.  Skin:  Negative for rash.  Neurological:  Negative for loss of consciousness and headaches.      Objective:     BP 132/80 (BP Location: Left Arm, Patient Position: Sitting, Cuff Size: Normal)   Pulse 72   Temp 97.6 F (36.4 C) (Oral)   Resp 18   Ht 5' 0.5" (1.537 m)  Wt 98 lb 3.2 oz (44.5 kg)   SpO2 97%   BMI 18.86 kg/m  BP Readings from Last 3 Encounters:  03/24/22 132/80  03/10/22 120/80   01/10/22 112/80   Wt Readings from Last 3 Encounters:  03/24/22 98 lb 3.2 oz (44.5 kg)  03/10/22 99 lb 6 oz (45.1 kg)  01/10/22 102 lb 3.2 oz (46.4 kg)   SpO2 Readings from Last 3 Encounters:  03/24/22 97%  01/10/22 95%  09/16/21 91%      Physical Exam Vitals and nursing note reviewed.  Constitutional:      Appearance: She is well-developed.  HENT:     Head: Normocephalic and atraumatic.  Eyes:     Conjunctiva/sclera: Conjunctivae normal.  Neck:     Thyroid: No thyromegaly.     Vascular: No carotid bruit or JVD.  Cardiovascular:     Rate and Rhythm: Normal rate and regular rhythm.     Heart sounds: Normal heart sounds. No murmur heard. Pulmonary:     Effort: Pulmonary effort is normal. No respiratory distress.     Breath sounds: Normal breath sounds. No wheezing or rales.  Chest:     Chest wall: No tenderness.  Abdominal:     Tenderness: There is abdominal tenderness in the epigastric area and left upper quadrant. There is guarding. There is no rebound.  Musculoskeletal:     Cervical back: Normal range of motion and neck supple.  Neurological:     Mental Status: She is alert and oriented to person, place, and time.  Psychiatric:        Mood and Affect: Mood normal.     No results found for any visits on 03/24/22.  Last CBC Lab Results  Component Value Date   WBC 6.2 01/10/2022   HGB 13.7 01/10/2022   HCT 41.3 01/10/2022   MCV 92.7 01/10/2022   MCH 30.9 04/01/2020   RDW 13.8 01/10/2022   PLT 206.0 03/00/9233   Last metabolic panel Lab Results  Component Value Date   GLUCOSE 91 01/10/2022   NA 138 01/10/2022   K 4.3 01/10/2022   CL 101 01/10/2022   CO2 31 01/10/2022   BUN 12 01/10/2022   CREATININE 0.91 01/10/2022   GFRNONAA >60 09/08/2019   CALCIUM 9.0 01/10/2022   PROT 6.2 01/10/2022   ALBUMIN 4.0 01/10/2022   BILITOT 0.3 01/10/2022   ALKPHOS 59 01/10/2022   AST 11 01/10/2022   ALT 7 01/10/2022   ANIONGAP 5 09/08/2019   Last lipids Lab  Results  Component Value Date   CHOL 154 01/10/2022   HDL 38.30 (L) 01/10/2022   LDLCALC 57 06/07/2021   LDLDIRECT 91.0 01/10/2022   TRIG 213.0 (H) 01/10/2022   CHOLHDL 4 01/10/2022   Last hemoglobin A1c Lab Results  Component Value Date   HGBA1C 6.1 12/18/2017   Last thyroid functions Lab Results  Component Value Date   TSH 2.07 06/07/2021   Last vitamin D Lab Results  Component Value Date   VD25OH 29 (L) 05/21/2009   Last vitamin B12 and Folate Lab Results  Component Value Date   VITAMINB12 310 06/07/2021   FOLATE 10.9 03/19/2009      The 10-year ASCVD risk score (Arnett DK, et al., 2019) is: 26%    Assessment & Plan:   Problem List Items Addressed This Visit       Unprioritized   COPD GOLD II   GERD (gastroesophageal reflux disease)   Relevant Medications   pantoprazole (PROTONIX) 40 MG tablet  Other Relevant Orders   CBC with Differential/Platelet   Comprehensive metabolic panel   H. pylori antibody, IgG   Amylase   Lipase   Weight loss - Primary    Due to abd pain  She states she wants to eat but unable to due to pain Ensure given to pt with coupons      Relevant Orders   CBC with Differential/Platelet   Comprehensive metabolic panel   H. pylori antibody, IgG   Amylase   Lipase   Epigastric pain    Restart protonix  Will discuss with GI for next steps Check labs May have surgery see her for other options as well        Return if symptoms worsen or fail to improve.    [name], DO

## 2022-03-27 ENCOUNTER — Telehealth: Payer: Self-pay | Admitting: Family Medicine

## 2022-03-27 NOTE — Telephone Encounter (Signed)
Pt called to follow up on GI referral. After reviewing chart, saw that referral had been closed but did not see a explicit reason for the closure. Advised pt that a note would be sent back to inquire about this and pt acknowledged understanding.

## 2022-03-27 NOTE — Telephone Encounter (Signed)
Left message for patient to call back and schedule Medicare Annual Wellness Visit (AWV).   Please offer to do virtually or by telephone.  Left office number and my jabber [phone].  AWVI eligilble as of 02/28/2018  Please schedule at anytime with Nurse Health Advisor.

## 2022-03-28 ENCOUNTER — Encounter: Payer: Self-pay | Admitting: Gastroenterology

## 2022-03-30 DIAGNOSIS — N3010 Interstitial cystitis (chronic) without hematuria: Secondary | ICD-10-CM | POA: Diagnosis not present

## 2022-04-05 DIAGNOSIS — R1010 Upper abdominal pain, unspecified: Secondary | ICD-10-CM | POA: Diagnosis not present

## 2022-04-05 DIAGNOSIS — R634 Abnormal weight loss: Secondary | ICD-10-CM | POA: Diagnosis not present

## 2022-04-05 DIAGNOSIS — R110 Nausea: Secondary | ICD-10-CM | POA: Diagnosis not present

## 2022-04-05 DIAGNOSIS — R194 Change in bowel habit: Secondary | ICD-10-CM | POA: Diagnosis not present

## 2022-04-06 ENCOUNTER — Other Ambulatory Visit: Payer: Self-pay | Admitting: Gastroenterology

## 2022-04-06 NOTE — Telephone Encounter (Signed)
After chart review, it appears she has transferred care to [facility]. Please advise on refills.

## 2022-04-07 ENCOUNTER — Other Ambulatory Visit: Payer: Self-pay | Admitting: Internal Medicine

## 2022-04-07 DIAGNOSIS — E785 Hyperlipidemia, unspecified: Secondary | ICD-10-CM

## 2022-04-07 DIAGNOSIS — I2510 Atherosclerotic heart disease of native coronary artery without angina pectoris: Secondary | ICD-10-CM

## 2022-04-07 DIAGNOSIS — I739 Peripheral vascular disease, unspecified: Secondary | ICD-10-CM

## 2022-04-17 DIAGNOSIS — R634 Abnormal weight loss: Secondary | ICD-10-CM | POA: Diagnosis not present

## 2022-04-17 DIAGNOSIS — R197 Diarrhea, unspecified: Secondary | ICD-10-CM | POA: Diagnosis not present

## 2022-04-19 ENCOUNTER — Other Ambulatory Visit: Payer: Self-pay | Admitting: Family Medicine

## 2022-04-19 DIAGNOSIS — I10 Essential (primary) hypertension: Secondary | ICD-10-CM

## 2022-05-06 DIAGNOSIS — N3000 Acute cystitis without hematuria: Secondary | ICD-10-CM | POA: Diagnosis not present

## 2022-05-08 DIAGNOSIS — F325 Major depressive disorder, single episode, in full remission: Secondary | ICD-10-CM | POA: Diagnosis not present

## 2022-05-12 DIAGNOSIS — N3010 Interstitial cystitis (chronic) without hematuria: Secondary | ICD-10-CM | POA: Diagnosis not present

## 2022-05-15 DIAGNOSIS — K8681 Exocrine pancreatic insufficiency: Secondary | ICD-10-CM | POA: Diagnosis not present

## 2022-05-17 ENCOUNTER — Other Ambulatory Visit: Payer: Self-pay | Admitting: Family Medicine

## 2022-05-17 DIAGNOSIS — R1013 Epigastric pain: Secondary | ICD-10-CM

## 2022-06-10 ENCOUNTER — Other Ambulatory Visit: Payer: Self-pay | Admitting: Gastroenterology

## 2022-06-30 ENCOUNTER — Other Ambulatory Visit: Payer: Self-pay | Admitting: Family Medicine

## 2022-06-30 ENCOUNTER — Other Ambulatory Visit: Payer: Self-pay | Admitting: Cardiovascular Disease

## 2022-06-30 DIAGNOSIS — E785 Hyperlipidemia, unspecified: Secondary | ICD-10-CM

## 2022-07-06 ENCOUNTER — Ambulatory Visit (INDEPENDENT_AMBULATORY_CARE_PROVIDER_SITE_OTHER): Payer: Medicare HMO

## 2022-07-06 DIAGNOSIS — Z23 Encounter for immunization: Secondary | ICD-10-CM

## 2022-07-06 NOTE — Progress Notes (Deleted)
Patient here for high dose flu vaccine.  Vaccine given in left deltoid and patient tolerated well.  

## 2022-07-10 DIAGNOSIS — K8681 Exocrine pancreatic insufficiency: Secondary | ICD-10-CM | POA: Diagnosis not present

## 2022-08-10 ENCOUNTER — Other Ambulatory Visit: Payer: Self-pay | Admitting: Internal Medicine

## 2022-08-10 DIAGNOSIS — I739 Peripheral vascular disease, unspecified: Secondary | ICD-10-CM

## 2022-08-10 DIAGNOSIS — I2584 Coronary atherosclerosis due to calcified coronary lesion: Secondary | ICD-10-CM

## 2022-08-10 DIAGNOSIS — E785 Hyperlipidemia, unspecified: Secondary | ICD-10-CM

## 2022-08-17 ENCOUNTER — Other Ambulatory Visit: Payer: Self-pay

## 2022-08-17 DIAGNOSIS — K219 Gastro-esophageal reflux disease without esophagitis: Secondary | ICD-10-CM

## 2022-08-17 MED ORDER — PANTOPRAZOLE SODIUM 40 MG PO TBEC: 40.0000 mg | DELAYED_RELEASE_TABLET | Freq: Every day | ORAL | 0 refills | Status: DC

## 2022-08-22 DIAGNOSIS — M25552 Pain in left hip: Secondary | ICD-10-CM | POA: Diagnosis not present

## 2022-09-04 ENCOUNTER — Other Ambulatory Visit: Payer: Self-pay | Admitting: Cardiovascular Disease

## 2022-09-05 ENCOUNTER — Other Ambulatory Visit: Payer: Self-pay | Admitting: Family Medicine

## 2022-09-05 DIAGNOSIS — M25552 Pain in left hip: Secondary | ICD-10-CM

## 2022-09-14 ENCOUNTER — Ambulatory Visit
Admission: RE | Admit: 2022-09-14 | Discharge: 2022-09-14 | Disposition: A | Discharge status: Home / Self Care | Payer: Medicare HMO | Source: Ambulatory Visit | Attending: Family Medicine | Admitting: Family Medicine

## 2022-09-14 DIAGNOSIS — M47816 Spondylosis without myelopathy or radiculopathy, lumbar region: Secondary | ICD-10-CM | POA: Diagnosis not present

## 2022-09-14 DIAGNOSIS — M25552 Pain in left hip: Secondary | ICD-10-CM

## 2022-09-14 DIAGNOSIS — M533 Sacrococcygeal disorders, not elsewhere classified: Secondary | ICD-10-CM | POA: Diagnosis not present

## 2022-09-14 DIAGNOSIS — M160 Bilateral primary osteoarthritis of hip: Secondary | ICD-10-CM | POA: Diagnosis not present

## 2022-09-14 DIAGNOSIS — S73192A Other sprain of left hip, initial encounter: Secondary | ICD-10-CM | POA: Diagnosis not present

## 2022-09-21 DIAGNOSIS — M25552 Pain in left hip: Secondary | ICD-10-CM | POA: Diagnosis not present

## 2022-10-17 DIAGNOSIS — J019 Acute sinusitis, unspecified: Secondary | ICD-10-CM | POA: Diagnosis not present

## 2022-10-17 DIAGNOSIS — R051 Acute cough: Secondary | ICD-10-CM | POA: Diagnosis not present

## 2022-10-17 DIAGNOSIS — R42 Dizziness and giddiness: Secondary | ICD-10-CM | POA: Diagnosis not present

## 2022-10-17 DIAGNOSIS — R0981 Nasal congestion: Secondary | ICD-10-CM | POA: Diagnosis not present

## 2022-11-17 ENCOUNTER — Other Ambulatory Visit: Payer: Self-pay | Admitting: Internal Medicine

## 2022-11-17 DIAGNOSIS — E785 Hyperlipidemia, unspecified: Secondary | ICD-10-CM

## 2022-11-17 DIAGNOSIS — I739 Peripheral vascular disease, unspecified: Secondary | ICD-10-CM

## 2022-11-17 DIAGNOSIS — I2510 Atherosclerotic heart disease of native coronary artery without angina pectoris: Secondary | ICD-10-CM

## 2022-11-23 ENCOUNTER — Other Ambulatory Visit: Payer: Self-pay | Admitting: Family Medicine

## 2022-11-23 DIAGNOSIS — I10 Essential (primary) hypertension: Secondary | ICD-10-CM

## 2022-11-27 DIAGNOSIS — R634 Abnormal weight loss: Secondary | ICD-10-CM | POA: Diagnosis not present

## 2022-11-27 DIAGNOSIS — R1319 Other dysphagia: Secondary | ICD-10-CM | POA: Diagnosis not present

## 2022-11-27 DIAGNOSIS — K8681 Exocrine pancreatic insufficiency: Secondary | ICD-10-CM | POA: Diagnosis not present

## 2022-11-27 DIAGNOSIS — K219 Gastro-esophageal reflux disease without esophagitis: Secondary | ICD-10-CM | POA: Diagnosis not present

## 2022-11-30 ENCOUNTER — Telehealth: Payer: Self-pay

## 2022-11-30 ENCOUNTER — Other Ambulatory Visit: Payer: Self-pay | Admitting: Internal Medicine

## 2022-11-30 DIAGNOSIS — I739 Peripheral vascular disease, unspecified: Secondary | ICD-10-CM

## 2022-11-30 DIAGNOSIS — E785 Hyperlipidemia, unspecified: Secondary | ICD-10-CM

## 2022-11-30 DIAGNOSIS — I2510 Atherosclerotic heart disease of native coronary artery without angina pectoris: Secondary | ICD-10-CM

## 2022-11-30 NOTE — Telephone Encounter (Signed)
Patient called to see when her last VAS [place] was done. She was informed the date was 03/22/22

## 2022-12-04 ENCOUNTER — Other Ambulatory Visit: Payer: Self-pay | Admitting: Family Medicine

## 2022-12-04 ENCOUNTER — Other Ambulatory Visit: Payer: Self-pay | Admitting: Internal Medicine

## 2022-12-04 DIAGNOSIS — E7849 Other hyperlipidemia: Secondary | ICD-10-CM

## 2022-12-04 DIAGNOSIS — E785 Hyperlipidemia, unspecified: Secondary | ICD-10-CM

## 2022-12-08 ENCOUNTER — Other Ambulatory Visit (HOSPITAL_COMMUNITY): Payer: Self-pay

## 2022-12-13 ENCOUNTER — Telehealth: Payer: Self-pay

## 2022-12-13 NOTE — Telephone Encounter (Signed)
Pharmacy Patient Advocate Encounter   Received notification from [facility] that prior authorization for REPATHA 140MG /ML is required/requested.   PER TEST CLAIM:

## 2022-12-26 ENCOUNTER — Other Ambulatory Visit (HOSPITAL_COMMUNITY): Payer: Self-pay

## 2023-01-04 ENCOUNTER — Other Ambulatory Visit: Payer: Self-pay | Admitting: Family Medicine

## 2023-01-04 DIAGNOSIS — K219 Gastro-esophageal reflux disease without esophagitis: Secondary | ICD-10-CM

## 2023-01-09 DIAGNOSIS — F325 Major depressive disorder, single episode, in full remission: Secondary | ICD-10-CM | POA: Diagnosis not present

## 2023-01-11 ENCOUNTER — Telehealth: Payer: Self-pay | Admitting: Cardiovascular Disease

## 2023-01-11 DIAGNOSIS — Z95828 Presence of other vascular implants and grafts: Secondary | ICD-10-CM

## 2023-01-11 DIAGNOSIS — I739 Peripheral vascular disease, unspecified: Secondary | ICD-10-CM

## 2023-01-11 NOTE — Telephone Encounter (Signed)
New Message:       Patient says she needs an order for an ultrasound. She says she has an ultrasound every August.

## 2023-01-11 NOTE — Telephone Encounter (Signed)
Left message for pt, orders have been placed.

## 2023-01-18 ENCOUNTER — Other Ambulatory Visit (HOSPITAL_COMMUNITY): Payer: Self-pay

## 2023-01-18 ENCOUNTER — Telehealth: Payer: Self-pay | Admitting: Internal Medicine

## 2023-01-18 ENCOUNTER — Telehealth: Payer: Self-pay

## 2023-01-18 NOTE — Telephone Encounter (Signed)
Pt c/o medication issue:  1. Name of Medication:   Evolocumab (REPATHA SURECLICK) 140 MG/ML SOAJ    2. How are you currently taking this medication (dosage and times per day)? N/A  3. Are you having a reaction (difficulty breathing--STAT)? N/A  4. What is your medication issue? Patient went to pharmacy to pick up medication and was advised they do not have any refills for her. Record states medication was sent 05/03 with 11 refills. Please advise.

## 2023-01-18 NOTE — Telephone Encounter (Signed)
Patient needs prior auth

## 2023-01-18 NOTE — Telephone Encounter (Signed)
   P/A ON FILE UNTIL 12.31.24 pref'd pharmacy is currently filling for a one month and rx is ready to be picked up

## 2023-01-24 DIAGNOSIS — K8681 Exocrine pancreatic insufficiency: Secondary | ICD-10-CM | POA: Diagnosis not present

## 2023-01-24 DIAGNOSIS — R197 Diarrhea, unspecified: Secondary | ICD-10-CM | POA: Diagnosis not present

## 2023-01-24 DIAGNOSIS — R1319 Other dysphagia: Secondary | ICD-10-CM | POA: Diagnosis not present

## 2023-01-24 DIAGNOSIS — R634 Abnormal weight loss: Secondary | ICD-10-CM | POA: Diagnosis not present

## 2023-01-24 DIAGNOSIS — K909 Intestinal malabsorption, unspecified: Secondary | ICD-10-CM | POA: Diagnosis not present

## 2023-01-24 DIAGNOSIS — K219 Gastro-esophageal reflux disease without esophagitis: Secondary | ICD-10-CM | POA: Diagnosis not present

## 2023-01-25 ENCOUNTER — Telehealth: Payer: Self-pay | Admitting: Gastroenterology

## 2023-01-25 NOTE — Telephone Encounter (Signed)
Good Morning Dr Myrtie Neither  We have received a request from patient wanting to transfer care back to our practice. Patient is requesting to see Dr Adela Lank.   Has recent history with [facility]. Records are available for review in epic. Please review and advise on scheduling.   Thank you

## 2023-01-29 NOTE — Telephone Encounter (Signed)
Dr. Adela Lank would have to be agreeable, as I am not.  - HD

## 2023-01-30 NOTE — Telephone Encounter (Signed)
This patient was just seen last week by her established GI provider.  Can you clarify for what issue she wants to be followed for?

## 2023-01-31 ENCOUNTER — Other Ambulatory Visit: Payer: Self-pay | Admitting: Family Medicine

## 2023-01-31 DIAGNOSIS — R1013 Epigastric pain: Secondary | ICD-10-CM

## 2023-01-31 NOTE — Telephone Encounter (Signed)
I am okay to see her for next routine office visit. Please let her know I am booking out a few months I believe for my next opening.

## 2023-01-31 NOTE — Telephone Encounter (Signed)
Good Morning Dr Adela Lank  Patient states she would like to be seen in the office for abnormal weight loss.

## 2023-02-04 ENCOUNTER — Other Ambulatory Visit: Payer: Self-pay | Admitting: Family Medicine

## 2023-02-04 DIAGNOSIS — I10 Essential (primary) hypertension: Secondary | ICD-10-CM

## 2023-02-07 NOTE — Telephone Encounter (Signed)
Called patient and left detailed message to call back and schedule. 

## 2023-02-08 ENCOUNTER — Encounter: Payer: Self-pay | Admitting: Gastroenterology

## 2023-02-08 NOTE — Telephone Encounter (Signed)
Pt scheduled for 11/5 at 9 am with Dr Adela Lank. Pt is currently scheduled with [facility] in October, informed patient that she will not be able to see both providers at once, pt understood and said she will cancel her appt with [facility]

## 2023-02-20 ENCOUNTER — Encounter (HOSPITAL_COMMUNITY): Payer: Medicare HMO

## 2023-02-21 ENCOUNTER — Telehealth: Payer: Self-pay | Admitting: Cardiovascular Disease

## 2023-02-21 NOTE — Telephone Encounter (Signed)
Patient called to get orders for ultra sound of leg.

## 2023-02-21 NOTE — Telephone Encounter (Signed)
Patient aware order has already been placed and she has appointment with imaging center on 03/23/23 at 9 am. She verbalized understanding.

## 2023-02-22 DIAGNOSIS — E7849 Other hyperlipidemia: Secondary | ICD-10-CM | POA: Diagnosis not present

## 2023-02-28 DIAGNOSIS — M25552 Pain in left hip: Secondary | ICD-10-CM | POA: Diagnosis not present

## 2023-03-02 ENCOUNTER — Ambulatory Visit: Payer: Medicare HMO | Attending: Internal Medicine | Admitting: Internal Medicine

## 2023-03-02 ENCOUNTER — Encounter: Payer: Self-pay | Admitting: Internal Medicine

## 2023-03-02 VITALS — BP 158/80 | HR 65 | Ht 60.0 in | Wt 92.8 lb

## 2023-03-02 DIAGNOSIS — F17200 Nicotine dependence, unspecified, uncomplicated: Secondary | ICD-10-CM | POA: Diagnosis not present

## 2023-03-02 DIAGNOSIS — I739 Peripheral vascular disease, unspecified: Secondary | ICD-10-CM | POA: Diagnosis not present

## 2023-03-02 DIAGNOSIS — Z95828 Presence of other vascular implants and grafts: Secondary | ICD-10-CM | POA: Diagnosis not present

## 2023-03-02 DIAGNOSIS — E7849 Other hyperlipidemia: Secondary | ICD-10-CM | POA: Diagnosis not present

## 2023-03-02 NOTE — Progress Notes (Unsigned)
LIPID CLINIC CONSULT NOTE  Chief Complaint:  Follow-up FH  Primary Care Physician: Zola Button, Grayling Congress, DO  HPI:  Leah [name] is a 77 y.o. female who is being seen today for the evaluation of high triglycerides at the request of Donato Schultz, *. This is a pleasant 77 year old female with multiple cardiovascular risk factors.  She was referred today for elevated triglycerides however also has an high LDL cholesterol.  Her most recent lipid profile demonstrates total cholesterol 223, triglycerides 182, HDL 35 and LDL of 152.  This is in the setting of treatment with high potency atorvastatin 80 mg daily and ezetimibe 10 mg daily.  The combined reduction of these medications is about 80 to 90% suggesting that her LDL is more likely 250-300.  This is a finding highly suggestive of familial hyperlipidemia.  In fact, there is evidence for familial hyperlipidemia.  Her mother had significantly elevated cholesterol.  Her father also had coronary disease and had coronary artery bypass grafting.  She has a daughter who has significant PAD and high cholesterol and she also has sister and a brother both of which you have high cholesterol and heart problems.  She has 2 grandparents on her mother side who both had heart attack.  In addition she is a smoker between 1/2 pack to a full pack per day.  She also has some history of PAD and was found to have moderate nonobstructive plaque in the left lower extremity and has been reporting some symptoms of claudication.  In addition she has known coronary disease.  She is not had a prior MI, however had a screening CT scan of the chest in July 2019 to rule out cancer given her smoking history.  This demonstrated multivessel and left main coronary artery calcification, significant aortic atherosclerosis and other findings suggestive of cardiovascular disease.  She is not on aspirin due to a history of stomach upset.  Other cardiac vascular risk factors  include hypertension on lisinopril.  Currently she reports dyspnea on exertion but denies chest pain.  She also has had some fatigue.  10/17/2019  Leah [name] returns today for follow-up.  Her lipids have declined significantly.  Most recently her total cholesterol was 75, triglycerides 136, HDL 46 and LDL of 2.  This is similar to her numbers about 9 months ago.  She has not had a dose adjustment in her medications.  By convention we tend to not try to overtreat her with a extremely low LDL cholesterol.  She still should be on a high potency statin and therefore would recommend decreasing her Lipitor from 80 to 40 mg daily.  Ultimately we may be able to discontinue her ezetimibe due to excellent response to the Repatha.  05/05/2021  Leah [name] returns today for follow-up.  Overall she seems to be doing very well on a combination of atorvastatin, ezetimibe and Repatha.  Her recent cholesterol in May showed total 134, HDL 46, LDL 64 triglycerides 137.  She denies any symptomatic claudication.  Unfortunately she continues to smoke but is try to cut back.  She is currently about 1/2 pack/day smoker.  PMHx:  Past Medical History:  Diagnosis Date   Allergy    Anemia    Anxiety    Barrett esophagus    Bladder pain    Cataract    Chronic bronchitis (HCC)    per pt last episode july 2016   Complication of anesthesia    slow to wake  COPD (chronic obstructive pulmonary disease) (HCC)    Depression    Diverticulosis    Frequency of urination    GERD (gastroesophageal reflux disease)    Hiatal hernia    History of adenomatous polyp of colon    tubular adenoma   History of chronic gastritis    History of diverticulitis of colon    History of esophageal dilatation    for stricture   History of esophageal spasm    Hyperlipidemia    Hypertension    IBS (irritable bowel syndrome)    Interstitial cystitis    PAD (peripheral artery disease) (HCC)    Productive cough    intermittant    Smokers' cough (HCC)    Urgency of urination     Past Surgical History:  Procedure Laterality Date   ABDOMINAL AORTOGRAM N/A 10/14/2018   Procedure: ABDOMINAL AORTOGRAM;  Surgeon: Runell Gess, MD;  Location: [facility] INVASIVE CV LAB;  Service: Cardiovascular;  Laterality: N/A;   ABDOMINAL HYSTERECTOMY  1975   ANTERIOR CERVICAL DECOMP/DISCECTOMY FUSION N/A 09/07/2014   Procedure: Exploration of Fusion and Removal of Anterior Cervical Hematoma;  Surgeon: Tressie Stalker, MD;  Location: [facility] NEURO ORS;  Service: Neurosurgery;  Laterality: N/A;   CARPAL TUNNEL RELEASE Right 1993   COLONOSCOPY  last one 04-30-2013   COLONOSCOPY WITH PROPOFOL N/A 09/09/2019   Procedure: COLONOSCOPY WITH PROPOFOL;  Surgeon: Sherrilyn Rist, MD;  Location: [facility] ENDOSCOPY;  Service: Gastroenterology;  Laterality: N/A;   CYSTO WITH HYDRODISTENSION N/A 06/08/2015   Procedure: CYSTOSCOPY/HYDRODISTENSION INSTILLATION OF MARCAINE AND PYRIDIUM;  Surgeon: Bjorn Pippin, MD;  Location: [facility];  Service: Urology;  Laterality: N/A;   CYSTO/  HOD/  BLADDER BX  1990's   ESOPHAGOGASTRODUODENOSCOPY  last one 09-11-2012   LAPAROSCOPIC APPENDECTOMY N/A 09/21/2013   Procedure: APPENDECTOMY LAPAROSCOPIC;  Surgeon: Emelia Loron, MD;  Location: [facility] OR;  Service: General;  Laterality: N/A;   LAPAROSCOPIC CHOLECYSTECTOMY  2002   LAPAROSCOPY SIGMOID COLECTOMY  10-05-2008   diverticulitis   LOWER EXTREMITY ANGIOGRAPHY Left 10/14/2018   LOWER EXTREMITY ANGIOGRAPHY Bilateral 10/14/2018   Procedure: Lower Extremity Angiography;  Surgeon: Runell Gess, MD;  Location: [facility] INVASIVE CV LAB;  Service: Cardiovascular;  Laterality: Bilateral;  ILIACS   MICROLARYNGOSCOPY  09-27-2006   w/ true vocal cord stripping  and bilateral bx's of lesion's (benign)   PERIPHERAL VASCULAR INTERVENTION  10/14/2018   Procedure: PERIPHERAL VASCULAR INTERVENTION;  Surgeon: Runell Gess, MD;  Location: [facility] INVASIVE CV LAB;  Service:  Cardiovascular;;   SINUS SURGERY WITH INSTATRAK  1991   TEMPOROMANDIBULAR JOINT SURGERY  1990   TONSILLECTOMY AND ADENOIDECTOMY  1964    FAMHx:  Family History  Problem Relation Age of Onset   Pancreatic cancer Mother    Colon polyps Mother    Pancreatic cancer Father    Diabetes Father    Heart disease Father    Leukemia Father    Prostate cancer Father    Hypertension Father    Heart disease Brother    Heart attack Brother    Other Daughter        prediabetes   Heart disease Daughter    Diabetes Daughter    Colon cancer Neg Hx    Esophageal cancer Neg Hx    Rectal cancer Neg Hx    Stomach cancer Neg Hx     SOCHx:   reports that she has been smoking cigarettes. She has a 35.3 pack-year smoking history. She has never used  smokeless tobacco. She reports that she does not drink alcohol and does not use drugs.  ALLERGIES:  Allergies  Allergen Reactions   Amoxicillin-Pot Clavulanate Shortness Of Breath, Itching and Swelling   Avelox [Moxifloxacin Hcl In Nacl] Swelling    Swelling, itching and shortness of breath   Ciprofloxacin Shortness Of Breath, Itching and Swelling   Keflex [Cephalexin] Shortness Of Breath and Swelling   Penicillins Shortness Of Breath, Itching and Swelling    Did it involve swelling of the face/tongue/throat, SOB, or low BP? Yes Did it involve sudden or severe rash/hives, skin peeling, or any reaction on the inside of your mouth or nose? No Did you need to seek medical attention at a hospital or doctor's office? Yes When did it last happen?      Several years If all above answers are "NO", may proceed with cephalosporin use.    Sulfa Antibiotics Shortness Of Breath, Itching and Swelling   Prednisone Other (See Comments)    GI irritation; is able to tolerate injections   Acyclovir And Related Other (See Comments)    Pt does not recall this reaction   Clindamycin/Lincomycin Itching and Swelling   Levaquin [Levofloxacin] Hives   Zyvox [Linezolid]  Diarrhea    ROS: Pertinent items noted in HPI and remainder of comprehensive ROS otherwise negative.  HOME MEDS: Current Outpatient Medications on File Prior to Visit  Medication Sig Dispense Refill   aspirin EC 81 MG EC tablet Take 1 tablet (81 mg total) by mouth daily.     atorvastatin (LIPITOR) 40 MG tablet TAKE 1 TABLET EVERY DAY (NEED MD APPOINTMENT FOR REFILLS) 90 tablet 3   clopidogrel (PLAVIX) 75 MG tablet TAKE 1 TABLET EVERY DAY 90 tablet 3   dicyclomine (BENTYL) 10 MG capsule TAKE 1 CAPSULE BY MOUTH AS NEEDED FOR UP TO 1 DOSE FOR SPASMS. 90 capsule 0   Digestive Enzymes (DIGESTIVE ENZYME PO) Take by mouth daily. Patient taking after each meal     estradiol (CLIMARA - DOSED IN MG/24 HR) 0.1 mg/24hr patch Place 0.1 mg onto the skin every Monday.  0   Evolocumab (REPATHA SURECLICK) 140 MG/ML SOAJ INJECT 1 DOSE INTO THE SKIN EVERY 14 (FOURTEEN) DAYS. 2 mL 11   ezetimibe (ZETIA) 10 MG tablet Take 1 tablet (10 mg total) by mouth daily. 90 tablet 0   famotidine (PEPCID) 20 MG tablet Take 1 tablet (20 mg total) by mouth 2 (two) times daily. 180 tablet 1   fluticasone (FLONASE) 50 MCG/ACT nasal spray Place 2 sprays into both nostrils daily. 16 g 6   lisinopril (ZESTRIL) 20 MG tablet Take 1 tablet (20 mg total) by mouth daily. 90 tablet 0   LORazepam (ATIVAN) 0.5 MG tablet Take 0.5 mg by mouth at bedtime.   5   ondansetron (ZOFRAN) 4 MG tablet TAKE 1 TABLET (4 MG TOTAL) BY MOUTH EVERY 6 (SIX) HOURS AS NEEDED FOR NAUSEA OR VOMITING 45 tablet 2   pantoprazole (PROTONIX) 40 MG tablet TAKE 1 TABLET EVERY DAY 90 tablet 3   polyethylene glycol (MIRALAX) 17 g packet Take 17 g by mouth 2 (two) times daily. 30 each 0   sertraline (ZOLOFT) 50 MG tablet Take 25 mg by mouth at bedtime.      methocarbamol (ROBAXIN) 500 MG tablet Take 1 tablet (500 mg total) by mouth 4 (four) times daily. (Patient not taking: Reported on 03/02/2023) 45 tablet 1   promethazine-dextromethorphan (PROMETHAZINE-DM) 6.25-15  MG/5ML syrup Take 5 mLs by mouth 4 (four) times  daily as needed. (Patient not taking: Reported on 03/02/2023) 118 mL 0   No current facility-administered medications on file prior to visit.    LABS/IMAGING: No results found for this or any previous visit (from the past 48 hour(s)). No results found.  LIPID PANEL:    Component Value Date/Time   CHOL 154 01/10/2022 1557   CHOL 134 12/09/2020 1120   TRIG 213.0 (H) 01/10/2022 1557   HDL 38.30 (L) 01/10/2022 1557   HDL 46 12/09/2020 1120   CHOLHDL 4 01/10/2022 1557   VLDL 42.6 (H) 01/10/2022 1557   LDLCALC 57 06/07/2021 1055   LDLCALC 64 12/09/2020 1120   LDLCALC 76 04/01/2020 1130   LDLDIRECT 91.0 01/10/2022 1557    WEIGHTS: Wt Readings from Last 3 Encounters:  03/02/23 92 lb 12.8 oz (42.1 kg)  03/24/22 98 lb 3.2 oz (44.5 kg)  03/10/22 99 lb 6 oz (45.1 kg)    VITALS: BP (!) 158/80 (BP Location: Left Arm, Patient Position: Sitting, Cuff Size: Small)   Pulse 65   Ht 5' (1.524 m)   Wt 92 lb 12.8 oz (42.1 kg)   SpO2 97%   BMI 18.12 kg/m   EXAM: Deferre d  EKG: Deferred  ASSESSMENT: Definite HeFH - Dutch score of 9 Multiple vessel coronary artery calcification/aortic atherosclerosis PAD with left lower extremity claudication Ongoing tobacco abuse Hypertension LP(a) negative  PLAN: 1.   Mrs. Spake has had significant reduction in her lipids and most likely has FH with LDL cholesterol well over 782 prior to starting therapy.  She is now on high intensity statin, ezetimibe and Repatha and has achieved an LDL cholesterol below 70.  She will need to continue with this.  Unfortunately she continues to smoke and she says she has issues with anxiety which drives that.  She is trying to cut back but is not ready to quit at this time.  She will need reauthorization of her Repatha as she has only 1 shot left.  We will take care of that for her.  Follow-up with me annually or sooner as necessary.  Chrystie Nose, MD, [facility], [facility],  FACP  [place]  [facility]  Medical Director of the Advanced Lipid Disorders &  Cardiovascular Risk Reduction Clinic Diplomate of the American Board of Clinical Lipidology Attending Cardiologist  Direct Dial: [phone]  Fax: [phone]  Website:  [email or web address]  03/02/2023, 10:56 AM

## 2023-03-02 NOTE — Patient Instructions (Signed)
Medication Instructions:  NO CHANGES   The [employer] Foundation offers assistance to help pay for medication copays.  They will cover copays for all cholesterol lowering meds, including statins, fibrates, omega-3 fish oils like Vascepa, ezetimibe, Repatha, Praluent, Nexletol, Nexlizet.  The cards are usually good for $2,500 or 12 months, whichever comes first. Our fax # is [phone] (you will need this to apply) Go to healthwellfoundation.org Click on "Apply Now" Answer questions as to whom is applying (patient or representative) Your disease fund will be "hypercholesterolemia - Medicare access" They will ask questions about finances and which medications you are taking for cholesterol When you submit, the approval is usually within minutes.  You will need to print the card information from the site You will need to show this information to your pharmacy, they will bill your Medicare Part D plan first -then bill [employer] --for the copay.   You can also call them at [phone], although the hold times can be quite long.      *If you need a refill on your cardiac medications before your next appointment, please call your pharmacy*   Lab Work: FASTING lab work to check cholesterol in 1 year  If you have labs (blood work) drawn today and your tests are completely normal, you will receive your results only by: MyChart Message (if you have MyChart) OR A paper copy in the mail If you have any lab test that is abnormal or we need to change your treatment, we will call you to review the results.   Follow-Up: At [facility], you and your health needs are our priority.  As part of our continuing mission to provide you with exceptional heart care, we have created designated Provider Care Teams.  These Care Teams include your primary Cardiologist (physician) and Advanced Practice Providers (APPs -  Physician Assistants and Nurse Practitioners) who all work together to provide you  with the care you need, when you need it.  We recommend signing up for the patient portal called "MyChart".  Sign up information is provided on this After Visit Summary.  MyChart is used to connect with patients for Virtual Visits (Telemedicine).  Patients are able to view lab/test results, encounter notes, upcoming appointments, etc.  Non-urgent messages can be sent to your provider as well.   To learn more about what you can do with MyChart, go to [email or web address].    Your next appointment:    12 months with Dr Rennis Golden

## 2023-03-06 ENCOUNTER — Ambulatory Visit (INDEPENDENT_AMBULATORY_CARE_PROVIDER_SITE_OTHER): Payer: Medicare HMO | Admitting: Family Medicine

## 2023-03-06 ENCOUNTER — Encounter: Payer: Self-pay | Admitting: Family Medicine

## 2023-03-06 VITALS — BP 120/80 | HR 66 | Temp 97.7°F | Resp 18 | Ht 60.0 in | Wt 91.4 lb

## 2023-03-06 DIAGNOSIS — R5383 Other fatigue: Secondary | ICD-10-CM

## 2023-03-06 DIAGNOSIS — J449 Chronic obstructive pulmonary disease, unspecified: Secondary | ICD-10-CM

## 2023-03-06 DIAGNOSIS — K219 Gastro-esophageal reflux disease without esophagitis: Secondary | ICD-10-CM

## 2023-03-06 DIAGNOSIS — R1013 Epigastric pain: Secondary | ICD-10-CM | POA: Diagnosis not present

## 2023-03-06 DIAGNOSIS — E785 Hyperlipidemia, unspecified: Secondary | ICD-10-CM | POA: Diagnosis not present

## 2023-03-06 DIAGNOSIS — F17200 Nicotine dependence, unspecified, uncomplicated: Secondary | ICD-10-CM

## 2023-03-06 DIAGNOSIS — I10 Essential (primary) hypertension: Secondary | ICD-10-CM | POA: Diagnosis not present

## 2023-03-06 DIAGNOSIS — Z0000 Encounter for general adult medical examination without abnormal findings: Secondary | ICD-10-CM | POA: Diagnosis not present

## 2023-03-06 DIAGNOSIS — Z1211 Encounter for screening for malignant neoplasm of colon: Secondary | ICD-10-CM

## 2023-03-06 MED ORDER — FAMOTIDINE 20 MG PO TABS: 20.0000 mg | ORAL_TABLET | Freq: Two times a day (BID) | ORAL | 3 refills | Status: DC

## 2023-03-06 MED ORDER — LISINOPRIL 20 MG PO TABS: 20.0000 mg | ORAL_TABLET | Freq: Every day | ORAL | 1 refills | Status: DC

## 2023-03-06 MED ORDER — PANTOPRAZOLE SODIUM 40 MG PO TBEC: 40.0000 mg | DELAYED_RELEASE_TABLET | Freq: Every day | ORAL | 3 refills | Status: DC

## 2023-03-06 MED ORDER — EZETIMIBE 10 MG PO TABS: 10.0000 mg | ORAL_TABLET | Freq: Every day | ORAL | 1 refills | Status: DC

## 2023-03-06 NOTE — Assessment & Plan Note (Signed)
Tolerating statin, encouraged heart healthy diet, avoid trans fats, minimize simple carbs and saturated fats. Increase exercise as tolerated  On repatha

## 2023-03-06 NOTE — Assessment & Plan Note (Signed)
stable °

## 2023-03-06 NOTE — Assessment & Plan Note (Signed)
Well controlled, no changes to meds. Encouraged heart healthy diet such as the DASH diet and exercise as tolerated.  °

## 2023-03-06 NOTE — Patient Instructions (Signed)
Preventive Care 65 Years and Older, Female Preventive care refers to lifestyle choices and visits with your health care provider that can promote health and wellness. Preventive care visits are also called wellness exams. What can I expect for my preventive care visit? Counseling Your health care provider may ask you questions about your: Medical history, including: Past medical problems. Family medical history. Pregnancy and menstrual history. History of falls. Current health, including: Memory and ability to understand (cognition). Emotional well-being. Home life and relationship well-being. Sexual activity and sexual health. Lifestyle, including: Alcohol, nicotine or tobacco, and drug use. Access to firearms. Diet, exercise, and sleep habits. Work and work environment. Sunscreen use. Safety issues such as seatbelt and bike helmet use. Physical exam Your health care provider will check your: Height and weight. These may be used to calculate your BMI (body mass index). BMI is a measurement that tells if you are at a healthy weight. Waist circumference. This measures the distance around your waistline. This measurement also tells if you are at a healthy weight and may help predict your risk of certain diseases, such as type 2 diabetes and high blood pressure. Heart rate and blood pressure. Body temperature. Skin for abnormal spots. What immunizations do I need?  Vaccines are usually given at various ages, according to a schedule. Your health care provider will recommend vaccines for you based on your age, medical history, and lifestyle or other factors, such as travel or where you work. What tests do I need? Screening Your health care provider may recommend screening tests for certain conditions. This may include: Lipid and cholesterol levels. Hepatitis C test. Hepatitis B test. HIV (human immunodeficiency virus) test. STI (sexually transmitted infection) testing, if you are at  risk. Lung cancer screening. Colorectal cancer screening. Diabetes screening. This is done by checking your blood sugar (glucose) after you have not eaten for a while (fasting). Mammogram. Talk with your health care provider about how often you should have regular mammograms. BRCA-related cancer screening. This may be done if you have a family history of breast, ovarian, tubal, or peritoneal cancers. Bone density scan. This is done to screen for osteoporosis. Talk with your health care provider about your test results, treatment options, and if necessary, the need for more tests. Follow these instructions at home: Eating and drinking  Eat a diet that includes fresh fruits and vegetables, whole grains, lean protein, and low-fat dairy products. Limit your intake of foods with high amounts of sugar, saturated fats, and salt. Take vitamin and mineral supplements as recommended by your health care provider. Do not drink alcohol if your health care provider tells you not to drink. If you drink alcohol: Limit how much you have to 0-1 drink a day. Know how much alcohol is in your drink. In the [place] one drink equals one 12 oz bottle of beer (355 mL), one 5 oz glass of wine (148 mL), or one 1 oz glass of hard liquor (44 mL). Lifestyle Brush your teeth every morning and night with fluoride toothpaste. Floss one time each day. Exercise for at least 30 minutes 5 or more days each week. Do not use any products that contain nicotine or tobacco. These products include cigarettes, chewing tobacco, and vaping devices, such as e-cigarettes. If you need help quitting, ask your health care provider. Do not use drugs. If you are sexually active, practice safe sex. Use a condom or other form of protection in order to prevent STIs. Take aspirin only as told by   your health care provider. Make sure that you understand how much to take and what form to take. Work with your health care provider to find out whether it  is safe and beneficial for you to take aspirin daily. Ask your health care provider if you need to take a cholesterol-lowering medicine (statin). Find healthy ways to manage stress, such as: Meditation, yoga, or listening to music. Journaling. Talking to a trusted person. Spending time with friends and family. Minimize exposure to UV radiation to reduce your risk of skin cancer. Safety Always wear your seat belt while driving or riding in a vehicle. Do not drive: If you have been drinking alcohol. Do not ride with someone who has been drinking. When you are tired or distracted. While texting. If you have been using any mind-altering substances or drugs. Wear a helmet and other protective equipment during sports activities. If you have firearms in your house, make sure you follow all gun safety procedures. What's next? Visit your health care provider once a year for an annual wellness visit. Ask your health care provider how often you should have your eyes and teeth checked. Stay up to date on all vaccines. This information is not intended to replace advice given to you by your health care provider. Make sure you discuss any questions you have with your health care provider. Document Revised: 01/12/2021 Document Reviewed: 01/12/2021 Elsevier Patient Education  2024 Elsevier Inc.  

## 2023-03-06 NOTE — Assessment & Plan Note (Signed)
Ghm utd Check labs  See AVS Health Maintenance  Topic Date Due   Medicare Annual Wellness (AWV)  Never done   Colonoscopy  09/08/2020   Lung Cancer Screening  10/12/2020   COVID-19 Vaccine (1 - 2023-24 season) Never done   INFLUENZA VACCINE  03/01/2023   Zoster Vaccines- Shingrix (1 of 2) 06/06/2023 (Originally 06/03/1998)   DEXA SCAN  11/17/2023   MAMMOGRAM  11/26/2023   DTaP/Tdap/Td (3 - Td or Tdap) 04/02/2028   Pneumonia Vaccine 42+ Years old  Completed   Hepatitis C Screening  Completed   HPV VACCINES  Aged Out

## 2023-03-06 NOTE — Assessment & Plan Note (Signed)
Pt does not want to stop

## 2023-03-06 NOTE — Progress Notes (Signed)
Established Patient Office Visit  Subjective   Patient ID: EDINA BIBLE, female    DOB: 03-07-1946  Age: 77 y.o. MRN: [id number]  Chief Complaint  Patient presents with   Hypertension   Follow-up    HPI Discussed the use of AI scribe software for clinical note transcription with the patient, who gave verbal consent to proceed.  History of Present Illness   The patient, with a history of cancer, presents with ongoing weight loss and fatigue. She reports a weight of 91 pounds on the clinic scale, and 93 pounds at home. Despite drinking Ensure a couple of times a week, she has not noticed significant weight gain. She also reports feeling tired frequently, which she attributes to increased responsibilities due to her husband's illness and possibly low B12 or hemoglobin levels. She has not yet had her mammogram or Pap smear for the year, and she continues to smoke about half a pack of cigarettes a day. She also reports that her neck has been bothering her, but she has not seen her neurosurgeon in a long time.      Patient Active Problem List   Diagnosis Date Noted   Weight loss 03/24/2022   Acute cough 06/28/2021   Acute pain of right shoulder 09/07/2020   Cold intolerance 09/07/2020   SBO (small bowel obstruction) (HCC) 09/19/2019   Current every day smoker 09/19/2019   Diverticulitis 09/19/2019   Partial small bowel obstruction (HCC) 09/04/2019   Preventative health care 08/27/2019   Family history of coronary artery disease 02/17/2019   Claudication in peripheral vascular disease (HCC) 10/14/2018   Dyspnea 09/03/2018   Calcification of native coronary artery 09/03/2018   PAD (peripheral artery disease) (HCC) 09/03/2018   History of esophageal stricture 06/19/2018   Rhinitis, chronic 04/23/2017   Small bowel obstruction due to adhesions (HCC) 04/08/2016   Frozen shoulder 04/27/2015   Hematoma 09/07/2014   Cervical spondylosis with myelopathy 08/18/2014   Cervical  spondylosis with radiculopathy 08/18/2014   Neck pain 05/19/2014   Fatigue 05/08/2014   Back pain 05/08/2014   Pain in joint, upper arm 05/08/2014   LLQ abdominal pain 03/02/2014   History of diverticulitis 03/02/2014   COPD exacerbation (HCC) 01/28/2014   Oral thrush 01/28/2014   Influenza 10/27/2013   Appendicitis 09/19/2013   Abdominal pain, left lower quadrant 03/05/2013   HTN (hypertension) 02/07/2013   Sinusitis, acute 01/15/2013   COPD GOLD II 01/15/2013   Smoker 12/20/2012   Epigastric pain 09/03/2012   Left lower quadrant pain 01/24/2011   Leg pain, left 06/07/2009   ROSACEA 04/16/2009   B12 DEFICIENCY 04/09/2009   ANEMIA, IRON DEFICIENCY 03/19/2009   MALAISE AND FATIGUE 03/19/2009   NECK PAIN, LEFT 01/11/2009   URI 07/30/2008   Constipation 05/26/2008   NAUSEA AND VOMITING 05/26/2008   Abdominal pain 05/26/2008   Claudication (HCC) 04/27/2008   VOMITING 04/17/2008   Dysphagia 04/17/2008   ANXIETY DEPRESSION 03/12/2008   TREMOR 03/12/2008   Dyslipidemia 11/13/2007   DEPRESSION 11/13/2007   ESOPHAGEAL SPASM 11/13/2007   GERD (gastroesophageal reflux disease) 11/13/2007   IBS 11/13/2007   CARPAL TUNNEL SYNDROME, HX OF 11/13/2007   Diverticulosis of colon (without mention of hemorrhage) 03/14/2007   GASTRITIS, CHRONIC 04/10/2002   HIATAL HERNIA 04/10/2002   Past Medical History:  Diagnosis Date   Allergy    Anemia    Anxiety    Barrett esophagus    Bladder pain    Cataract    Chronic bronchitis (HCC)  per pt last episode july 2016   Complication of anesthesia    slow to wake   COPD (chronic obstructive pulmonary disease) (HCC)    Depression    Diverticulosis    Frequency of urination    GERD (gastroesophageal reflux disease)    Hiatal hernia    History of adenomatous polyp of colon    tubular adenoma   History of chronic gastritis    History of diverticulitis of colon    History of esophageal dilatation    for stricture   History of  esophageal spasm    Hyperlipidemia    Hypertension    IBS (irritable bowel syndrome)    Interstitial cystitis    PAD (peripheral artery disease) (HCC)    Productive cough    intermittant   Smokers' cough (HCC)    Urgency of urination    Past Surgical History:  Procedure Laterality Date   ABDOMINAL AORTOGRAM N/A 10/14/2018   Procedure: ABDOMINAL AORTOGRAM;  Surgeon: Runell Gess, MD;  Location: [facility] INVASIVE CV LAB;  Service: Cardiovascular;  Laterality: N/A;   ABDOMINAL HYSTERECTOMY  1975   ANTERIOR CERVICAL DECOMP/DISCECTOMY FUSION N/A 09/07/2014   Procedure: Exploration of Fusion and Removal of Anterior Cervical Hematoma;  Surgeon: Tressie Stalker, MD;  Location: [facility] NEURO ORS;  Service: Neurosurgery;  Laterality: N/A;   CARPAL TUNNEL RELEASE Right 1993   COLONOSCOPY  last one 04-30-2013   COLONOSCOPY WITH PROPOFOL N/A 09/09/2019   Procedure: COLONOSCOPY WITH PROPOFOL;  Surgeon: Sherrilyn Rist, MD;  Location: [facility]OSCOPY;  Service: Gastroenterology;  Laterality: N/A;   CYSTO WITH HYDRODISTENSION N/A 06/08/2015   Procedure: CYSTOSCOPY/HYDRODISTENSION INSTILLATION OF MARCAINE AND PYRIDIUM;  Surgeon: Bjorn Pippin, MD;  Location[facility];  Service: Urology;  Laterality: N/A;   CYSTO/  HOD/  BLADDER BX  1990's   ESOPHAGOGASTRODUODENOSCOPY  last one 09-11-2012   LAPAROSCOPIC APPENDECTOMY N/A 09/21/2013   Procedure: APPENDECTOMY LAPAROSCOPIC;  Surgeon: Emelia Loron, MD;  Location: [facility] OR;  Service: General;  Laterality: N/A;   LAPAROSCOPIC CHOLECYSTECTOMY  2002   LAPAROSCOPY SIGMOID COLECTOMY  10-05-2008   diverticulitis   LOWER EXTREMITY ANGIOGRAPHY Left 10/14/2018   LOWER EXTREMITY ANGIOGRAPHY Bilateral 10/14/2018   Procedure: Lower Extremity Angiography;  Surgeon: Runell Gess, MD;  Location: [facility] INVASIVE CV LAB;  Service: Cardiovascular;  Laterality: Bilateral;  ILIACS   MICROLARYNGOSCOPY  09-27-2006   w/ true vocal cord stripping  and bilateral bx's of  lesion's (benign)   PERIPHERAL VASCULAR INTERVENTION  10/14/2018   Procedure: PERIPHERAL VASCULAR INTERVENTION;  Surgeon: Runell Gess, MD;  Location: [facility] INVASIVE CV LAB;  Service: Cardiovascular;;   SINUS SURGERY WITH INSTATRAK  1991   TEMPOROMANDIBULAR JOINT SURGERY  1990   TONSILLECTOMY AND ADENOIDECTOMY  1964   Social History   Tobacco Use   Smoking status: Every Day    Current packs/day: 0.50    Average packs/day: 0.7 packs/day for 47.6 years (35.5 ttl pk-yrs)    Types: Cigarettes    Start date: 2024   Smokeless tobacco: Never   Tobacco comments:    counseled about smoking cessation  Vaping Use   Vaping status: Never Used  Substance Use Topics   Alcohol use: No    Alcohol/week: 0.0 standard drinks of alcohol   Drug use: No   Social History   Socioeconomic History   Marital status: Married    Spouse name: Not on file   Number of children: 3   Years of education: Not on file  Highest education level: Not on file  Occupational History   Not on file  Tobacco Use   Smoking status: Every Day    Current packs/day: 0.50    Average packs/day: 0.7 packs/day for 47.6 years (35.5 ttl pk-yrs)    Types: Cigarettes    Start date: 2024   Smokeless tobacco: Never   Tobacco comments:    counseled about smoking cessation  Vaping Use   Vaping status: Never Used  Substance and Sexual Activity   Alcohol use: No    Alcohol/week: 0.0 standard drinks of alcohol   Drug use: No   Sexual activity: Not Currently    Birth control/protection: Post-menopausal  Other Topics Concern   Not on file  Social History Narrative   Not on file   Social Determinants of Health   Financial Resource Strain: Not on file  Food Insecurity: Not on file  Transportation Needs: Not on file  Physical Activity: Not on file  Stress: Not on file  Social Connections: Not on file  Intimate Partner Violence: Not on file   Family Status  Relation Name Status   Mother  Deceased   Father  Deceased    Sister  Alive   Brother  Alive   MGM  Deceased   MGF  Deceased   PGM  Deceased   PGF  Deceased   Daughter  Alive   Daughter  Alive   Son  Alive   Neg Hx  (Not Specified)  No partnership data on file   Family History  Problem Relation Age of Onset   Pancreatic cancer Mother    Colon polyps Mother    Pancreatic cancer Father    Diabetes Father    Heart disease Father    Leukemia Father    Prostate cancer Father    Hypertension Father    Heart disease Brother    Heart attack Brother    Other Daughter        prediabetes   Heart disease Daughter    Diabetes Daughter    Colon cancer Neg Hx    Esophageal cancer Neg Hx    Rectal cancer Neg Hx    Stomach cancer Neg Hx    Allergies  Allergen Reactions   Amoxicillin-Pot Clavulanate Shortness Of Breath, Itching and Swelling   Avelox [Moxifloxacin Hcl In Nacl] Swelling    Swelling, itching and shortness of breath   Ciprofloxacin Shortness Of Breath, Itching and Swelling   Keflex [Cephalexin] Shortness Of Breath and Swelling   Penicillins Shortness Of Breath, Itching and Swelling    Did it involve swelling of the face/tongue/throat, SOB, or low BP? Yes Did it involve sudden or severe rash/hives, skin peeling, or any reaction on the inside of your mouth or nose? No Did you need to seek medical attention at a hospital or doctor's office? Yes When did it last happen?      Several years If all above answers are "NO", may proceed with cephalosporin use.    Sulfa Antibiotics Shortness Of Breath, Itching and Swelling   Prednisone Other (See Comments)    GI irritation; is able to tolerate injections   Acyclovir And Related Other (See Comments)    Pt does not recall this reaction   Clindamycin/Lincomycin Itching and Swelling   Levaquin [Levofloxacin] Hives   Zyvox [Linezolid] Diarrhea      Review of Systems  Constitutional:  Negative for chills, fever and malaise/fatigue.  HENT:  Negative for congestion and hearing loss.    Eyes:  Negative for blurred vision and discharge.  Respiratory:  Negative for cough, sputum production and shortness of breath.   Cardiovascular:  Negative for chest pain, palpitations and leg swelling.  Gastrointestinal:  Negative for abdominal pain, blood in stool, constipation, diarrhea, heartburn, nausea and vomiting.  Genitourinary:  Negative for dysuria, frequency, hematuria and urgency.  Musculoskeletal:  Negative for back pain, falls and myalgias.  Skin:  Negative for rash.  Neurological:  Negative for dizziness, sensory change, loss of consciousness, weakness and headaches.  Endo/Heme/Allergies:  Negative for environmental allergies. Does not bruise/bleed easily.  Psychiatric/Behavioral:  Negative for depression and suicidal ideas. The patient is not nervous/anxious and does not have insomnia.       Objective:     BP 120/80 (BP Location: Left Arm, Patient Position: Sitting, Cuff Size: Small)   Pulse 66   Temp 97.7 F (36.5 C) (Oral)   Resp 18   Ht 5' (1.524 m)   Wt 91 lb 6.4 oz (41.5 kg)   SpO2 97%   BMI 17.85 kg/m  BP Readings from Last 3 Encounters:  03/06/23 120/80  03/02/23 (!) 158/80  03/24/22 132/80   Wt Readings from Last 3 Encounters:  03/06/23 91 lb 6.4 oz (41.5 kg)  03/02/23 92 lb 12.8 oz (42.1 kg)  03/24/22 98 lb 3.2 oz (44.5 kg)   SpO2 Readings from Last 3 Encounters:  03/06/23 97%  03/02/23 97%  03/24/22 97%      Physical Exam Vitals and nursing note reviewed.  Constitutional:      General: She is not in acute distress.    Appearance: Normal appearance. She is well-developed.  HENT:     Head: Normocephalic and atraumatic.     Right Ear: Tympanic membrane, ear canal and external ear normal. There is no impacted cerumen.     Left Ear: Tympanic membrane, ear canal and external ear normal. There is no impacted cerumen.     Nose: Nose normal.     Mouth/Throat:     Mouth: Mucous membranes are moist.     Pharynx: Oropharynx is clear. No  oropharyngeal exudate or posterior oropharyngeal erythema.  Eyes:     General: No scleral icterus.       Right eye: No discharge.        Left eye: No discharge.     Conjunctiva/sclera: Conjunctivae normal.     Pupils: Pupils are equal, round, and reactive to light.  Neck:     Thyroid: No thyromegaly or thyroid tenderness.     Vascular: No JVD.  Cardiovascular:     Rate and Rhythm: Normal rate and regular rhythm.     Heart sounds: Normal heart sounds. No murmur heard. Pulmonary:     Effort: Pulmonary effort is normal. No respiratory distress.     Breath sounds: Normal breath sounds.  Abdominal:     General: Bowel sounds are normal. There is no distension.     Palpations: Abdomen is soft. There is no mass.     Tenderness: There is no abdominal tenderness. There is no guarding or rebound.  Genitourinary:    Vagina: Normal.  Musculoskeletal:        General: Normal range of motion.     Cervical back: Normal range of motion and neck supple.     Right lower leg: No edema.     Left lower leg: No edema.  Lymphadenopathy:     Cervical: No cervical adenopathy.  Skin:    General: Skin is warm and dry.  Findings: No erythema or rash.  Neurological:     Mental Status: She is alert and oriented to person, place, and time.     Cranial Nerves: No cranial nerve deficit.     Deep Tendon Reflexes: Reflexes are normal and symmetric.  Psychiatric:        Mood and Affect: Mood normal.        Behavior: Behavior normal.        Thought Content: Thought content normal.        Judgment: Judgment normal.      No results found for any visits on 03/06/23.  Last CBC Lab Results  Component Value Date   WBC 5.2 03/24/2022   HGB 14.0 03/24/2022   HCT 42.3 03/24/2022   MCV 93.1 03/24/2022   MCH 30.9 04/01/2020   RDW 13.1 03/24/2022   PLT 181.0 03/24/2022   Last metabolic panel Lab Results  Component Value Date   GLUCOSE 83 03/24/2022   NA 137 03/24/2022   K 4.0 03/24/2022   CL 102  03/24/2022   CO2 32 03/24/2022   BUN 11 03/24/2022   CREATININE 0.87 03/24/2022   GFR 65.92 03/24/2022   CALCIUM 8.9 03/24/2022   PROT 6.5 03/24/2022   ALBUMIN 4.3 03/24/2022   BILITOT 0.4 03/24/2022   ALKPHOS 62 03/24/2022   AST 13 03/24/2022   ALT 8 03/24/2022   ANIONGAP 5 09/08/2019   Last lipids Lab Results  Component Value Date   CHOL 154 01/10/2022   HDL 38.30 (L) 01/10/2022   LDLCALC 57 06/07/2021   LDLDIRECT 91.0 01/10/2022   TRIG 213.0 (H) 01/10/2022   CHOLHDL 4 01/10/2022   Last hemoglobin A1c Lab Results  Component Value Date   HGBA1C 6.1 12/18/2017   Last thyroid functions Lab Results  Component Value Date   TSH 2.07 06/07/2021   Last vitamin D Lab Results  Component Value Date   VD25OH 29 (L) 05/21/2009   Last vitamin B12 and Folate Lab Results  Component Value Date   VITAMINB12 310 06/07/2021   FOLATE 10.9 03/19/2009      The 10-year ASCVD risk score (Arnett DK, et al., 2019) is: 23.9%    Assessment & Plan:   Problem List Items Addressed This Visit       Unprioritized   Fatigue   Relevant Orders   TSH   Vitamin B12   VITAMIN D 25 Hydroxy (Vit-D Deficiency, Fractures)   GERD (gastroesophageal reflux disease)   Relevant Medications   pantoprazole (PROTONIX) 40 MG tablet   famotidine (PEPCID) 20 MG tablet   Preventative health care - Primary    Ghm utd Check labs  See AVS Health Maintenance  Topic Date Due   Medicare Annual Wellness (AWV)  Never done   Colonoscopy  09/08/2020   Lung Cancer Screening  10/12/2020   COVID-19 Vaccine (1 - 2023-24 season) Never done   INFLUENZA VACCINE  03/01/2023   Zoster Vaccines- Shingrix (1 of 2) 06/06/2023 (Originally 06/03/1998)   DEXA SCAN  11/17/2023   MAMMOGRAM  11/26/2023   DTaP/Tdap/Td (3 - Td or Tdap) 04/02/2028   Pneumonia Vaccine 43+ Years old  Completed   Hepatitis C Screening  Completed   HPV VACCINES  Aged Out         HTN (hypertension)    Well controlled, no changes to  meds. Encouraged heart healthy diet such as the DASH diet and exercise as tolerated.        Relevant Medications   lisinopril (ZESTRIL) 20  MG tablet   ezetimibe (ZETIA) 10 MG tablet   Dyslipidemia    Tolerating statin, encouraged heart healthy diet, avoid trans fats, minimize simple carbs and saturated fats. Increase exercise as tolerated  On repatha      Relevant Medications   ezetimibe (ZETIA) 10 MG tablet   Current every day smoker    Pt does not want to stop       Relevant Orders   Ambulatory Referral Lung Cancer Screening [facility]   COPD GOLD II    stable      Other Visit Diagnoses     Essential hypertension       Relevant Medications   lisinopril (ZESTRIL) 20 MG tablet   ezetimibe (ZETIA) 10 MG tablet   Other Relevant Orders   Lipid panel   Comprehensive metabolic panel   CBC with Differential/Platelet   Hyperlipidemia, unspecified hyperlipidemia type       Relevant Medications   lisinopril (ZESTRIL) 20 MG tablet   ezetimibe (ZETIA) 10 MG tablet   Other Relevant Orders   Lipid panel   Comprehensive metabolic panel   CBC with Differential/Platelet   Dyspepsia       Relevant Medications   famotidine (PEPCID) 20 MG tablet   Colon cancer screening       Relevant Orders   Ambulatory referral to Gastroenterology     Assessment and Plan    Unintentional Weight Loss Patient reports difficulty gaining weight. Currently consuming Ensure a few times a week, but finds it expensive. -Recommend trying [employer] as a potentially cheaper alternative. -Provide patient with Ensure and a coupon to help offset cost.  Cervicalgia Patient reports neck pain. No recent evaluation by a neurosurgeon. -No specific plan discussed in the conversation.  Tobacco Use Patient reports smoking half a pack per day. -No specific plan discussed in the conversation.  General Health Maintenance / Followup Plans -Overdue for mammogram and Pap smear. Patient  is aware and plans to schedule. -Overdue for colonoscopy. Patient is aware and plans to schedule. -Overdue for eye exam. Patient is aware and plans to schedule. -Bone density scan due next year. -Order labs today. -Consider lung cancer screening CT scan, as patient is a smoker and it has not been previously discussed. -Schedule physical exam.        Return in about 6 months (around 09/06/2023), or if symptoms worsen or fail to improve.    Donato Schultz, DO

## 2023-03-09 ENCOUNTER — Other Ambulatory Visit: Payer: Self-pay

## 2023-03-09 MED ORDER — VITAMIN D (ERGOCALCIFEROL) 1.25 MG (50000 UNIT) PO CAPS: 50000.0000 [IU] | ORAL_CAPSULE | ORAL | 1 refills | Status: DC

## 2023-03-23 ENCOUNTER — Ambulatory Visit (HOSPITAL_COMMUNITY): Admission: RE | Admit: 2023-03-23 | Payer: Medicare HMO | Source: Ambulatory Visit

## 2023-03-23 ENCOUNTER — Ambulatory Visit (HOSPITAL_COMMUNITY)
Admission: RE | Admit: 2023-03-23 | Discharge: 2023-03-23 | Disposition: A | Discharge status: Home / Self Care | Payer: Medicare HMO | Source: Ambulatory Visit | Attending: Cardiovascular Disease | Admitting: Cardiovascular Disease

## 2023-03-23 DIAGNOSIS — I739 Peripheral vascular disease, unspecified: Secondary | ICD-10-CM | POA: Diagnosis not present

## 2023-03-23 DIAGNOSIS — Z95828 Presence of other vascular implants and grafts: Secondary | ICD-10-CM | POA: Insufficient documentation

## 2023-03-23 LAB — VAS US ABI WITH/WO TBI
Left ABI: 1.09
Right ABI: 1.03

## 2023-03-30 ENCOUNTER — Telehealth: Payer: Self-pay | Admitting: Gastroenterology

## 2023-03-30 DIAGNOSIS — M25511 Pain in right shoulder: Secondary | ICD-10-CM | POA: Diagnosis not present

## 2023-03-30 DIAGNOSIS — M542 Cervicalgia: Secondary | ICD-10-CM | POA: Diagnosis not present

## 2023-03-30 DIAGNOSIS — Z681 Body mass index (BMI) 19 or less, adult: Secondary | ICD-10-CM | POA: Diagnosis not present

## 2023-03-30 NOTE — Telephone Encounter (Signed)
Returned call to patient. Pt states that her bowels "feels knotted". Pt reports that she has been passing stools about the "size of her little finger". Pt reports then at times she will pass a lot of liquid stool. Pt only takes 1 capful of Miralax daily. I advised pt that it is possible that she is constipated and needs to increase Miralax up to TID for a few days to see if that helps. Pt advised to continue to use Bentyl PRN for abdominal pain. I checked the schedule for a sooner appt with MD and an APP and nothing sooner than currently scheduled appt. Pt has been advised to call back if she continues to have concerns prior to her office visit. Pt verbalized understanding and had no concerns at the end of the call.

## 2023-03-30 NOTE — Telephone Encounter (Signed)
Patient called requesting to speak with a nurse states she has issues with her bowels and seeking advise.

## 2023-04-08 ENCOUNTER — Other Ambulatory Visit: Payer: Self-pay | Admitting: Family Medicine

## 2023-04-08 DIAGNOSIS — K219 Gastro-esophageal reflux disease without esophagitis: Secondary | ICD-10-CM

## 2023-04-11 ENCOUNTER — Other Ambulatory Visit: Payer: Self-pay | Admitting: Student

## 2023-04-11 ENCOUNTER — Telehealth (HOSPITAL_BASED_OUTPATIENT_CLINIC_OR_DEPARTMENT_OTHER): Payer: Self-pay | Admitting: *Deleted

## 2023-04-11 DIAGNOSIS — M542 Cervicalgia: Secondary | ICD-10-CM

## 2023-04-11 NOTE — Telephone Encounter (Signed)
     Primary Cardiologist: Nanetta Batty, MD  Chart reviewed as part of pre-operative protocol coverage. Given past medical history and time since last visit, based on ACC/AHA guidelines, Leah [name] would be at acceptable risk for the planned procedure without further cardiovascular testing.   Her aspirin and Plavix may be held for 5 to 7 days prior to her procedure.  Please resume as soon as hemostasis is achieved.  I will route this recommendation to the requesting party via Epic fax function and remove from pre-op pool.  Please call with questions.  Thomasene Ripple. Maydell Knoebel NP-C     04/11/2023, 1:26 PM [facility] [address] Office [phone] Fax [phone]

## 2023-04-11 NOTE — Telephone Encounter (Signed)
   Pre-operative Risk Assessment    Patient Name: Leah [name]  DOB: March 10, 1946 MRN: [id number]      Request for Surgical Clearance    Procedure:   Cervical Epidural   Date of Surgery:  Clearance TBD                                 Surgeon:  [facility] Surgeon's Group or Practice Name:  [facility] Phone number:  [phone] Fax number:  [phone]   Type of Clearance Requested:   - Medical  - Pharmacy:  Hold Clopidogrel (Plavix) 5 days.   Type of Anesthesia:  Not Indicated   Additional requests/questions:    Signed, Emmit Pomfret   04/11/2023, 12:58 PM

## 2023-04-19 ENCOUNTER — Encounter: Payer: Self-pay | Admitting: Student

## 2023-04-20 ENCOUNTER — Other Ambulatory Visit: Payer: Self-pay | Admitting: Cardiovascular Disease

## 2023-04-20 DIAGNOSIS — E785 Hyperlipidemia, unspecified: Secondary | ICD-10-CM

## 2023-04-23 NOTE — Discharge Instructions (Signed)
Post Procedure Spinal Discharge Instruction Sheet  You may resume a regular diet and any medications that you routinely take (including pain medications) unless otherwise noted by MD.  No driving day of procedure.  Light activity throughout the rest of the day.  Do not do any strenuous work, exercise, bending or lifting.  The day following the procedure, you can resume normal physical activity but you should refrain from exercising or physical therapy for at least three days thereafter.  You may apply ice to the injection site, 20 minutes on, 20 minutes off, as needed. Do not apply ice directly to skin.    Common Side Effects:  Headaches- take your usual medications as directed by your physician.  Increase your fluid intake.  Caffeinated beverages may be helpful.  Lie flat in bed until your headache resolves.  Restlessness or inability to sleep- you may have trouble sleeping for the next few days.  Ask your referring physician if you need any medication for sleep.  Facial flushing or redness- should subside within a few days.  Increased pain- a temporary increase in pain a day or two following your procedure is not unusual.  Take your pain medication as prescribed by your referring physician.  Leg cramps  Please contact our office at [phone] for the following symptoms: Fever greater than 100 degrees. Headaches unresolved with medication after 2-3 days. Increased swelling, pain, or redness at injection site.   Thank you for visiting [facility] today.   YOU MAY RESUME YOUR PLAVIX TODAY, POST PROCEDURE

## 2023-04-24 ENCOUNTER — Ambulatory Visit
Admission: RE | Admit: 2023-04-24 | Discharge: 2023-04-24 | Disposition: A | Discharge status: Home / Self Care | Payer: Medicare HMO | Source: Ambulatory Visit | Attending: Student

## 2023-04-24 ENCOUNTER — Other Ambulatory Visit: Payer: Medicare HMO

## 2023-04-24 DIAGNOSIS — M4722 Other spondylosis with radiculopathy, cervical region: Secondary | ICD-10-CM | POA: Diagnosis not present

## 2023-04-24 DIAGNOSIS — M542 Cervicalgia: Secondary | ICD-10-CM

## 2023-04-24 MED ORDER — IOPAMIDOL (ISOVUE-M 300) INJECTION 61%
1.0000 mL | Freq: Once | INTRAMUSCULAR | Status: AC | PRN
  Administered 2023-04-24: 1 mL via EPIDURAL

## 2023-04-24 MED ORDER — TRIAMCINOLONE ACETONIDE 40 MG/ML IJ SUSP (RADIOLOGY)
60.0000 mg | Freq: Once | INTRAMUSCULAR | Status: AC
  Administered 2023-04-24: 60 mg via EPIDURAL

## 2023-04-30 DIAGNOSIS — N3010 Interstitial cystitis (chronic) without hematuria: Secondary | ICD-10-CM | POA: Diagnosis not present

## 2023-05-05 DIAGNOSIS — B370 Candidal stomatitis: Secondary | ICD-10-CM | POA: Diagnosis not present

## 2023-05-05 DIAGNOSIS — K1379 Other lesions of oral mucosa: Secondary | ICD-10-CM | POA: Diagnosis not present

## 2023-05-11 DIAGNOSIS — Z1231 Encounter for screening mammogram for malignant neoplasm of breast: Secondary | ICD-10-CM | POA: Diagnosis not present

## 2023-05-11 LAB — HM MAMMOGRAPHY

## 2023-05-14 ENCOUNTER — Encounter: Payer: Self-pay | Admitting: Family Medicine

## 2023-06-04 ENCOUNTER — Ambulatory Visit (INDEPENDENT_AMBULATORY_CARE_PROVIDER_SITE_OTHER): Payer: Medicare HMO | Admitting: Medical

## 2023-06-04 VITALS — BP 130/64 | HR 98 | Temp 97.9°F | Resp 18 | Ht 60.0 in | Wt 88.6 lb

## 2023-06-04 DIAGNOSIS — R9389 Abnormal findings on diagnostic imaging of other specified body structures: Secondary | ICD-10-CM | POA: Diagnosis not present

## 2023-06-04 DIAGNOSIS — J0100 Acute maxillary sinusitis, unspecified: Secondary | ICD-10-CM

## 2023-06-04 DIAGNOSIS — R051 Acute cough: Secondary | ICD-10-CM

## 2023-06-04 DIAGNOSIS — J40 Bronchitis, not specified as acute or chronic: Secondary | ICD-10-CM

## 2023-06-04 MED ORDER — HYDROCODONE BIT-HOMATROP MBR 5-1.5 MG/5ML PO SOLN: 5.0000 mL | Freq: Three times a day (TID) | ORAL | 0 refills | Status: DC | PRN

## 2023-06-04 MED ORDER — DOXYCYCLINE HYCLATE 100 MG PO TABS: 100.0000 mg | ORAL_TABLET | Freq: Two times a day (BID) | ORAL | 0 refills | Status: DC

## 2023-06-04 MED ORDER — ALBUTEROL SULFATE HFA 108 (90 BASE) MCG/ACT IN AERS: 2.0000 | INHALATION_SPRAY | Freq: Four times a day (QID) | RESPIRATORY_TRACT | 0 refills | Status: DC | PRN

## 2023-06-04 NOTE — Progress Notes (Signed)
Subjective:    Patient ID: Leah [name], female    DOB: Jul 22, 1946, 77 y.o.   MRN: [id number]  HPI  Discussed the use of AI scribe software for clinical note transcription with the patient, who gave verbal consent to proceed.  History of Present Illness   The patient, with a history of smoking and abnormal CT findings suggestive of emphysema and bronchial wall thickening, presented with a two-week history of sinus pressure and congestion. Recently, she noticed these symptoms progressing into her chest. The patient described a cough that is intermittently dry and productive, with light yellow sputum. The cough is sporadic, severe at times, and disrupts her sleep. She denied any wheezing or mucus production from the nose, which remained clear. The patient also reported a sensation of postnasal drip.  In the past, the patient had episodes of bronchitis during which she would temporarily stop smoking. She had been prescribed inhalers, but found them to be ineffective and exacerbating her sneezing. The patient also reported a significant life event, the recent passing of her spouse.  The patient's symptoms, combined with her smoking history and previous CT findings, raised concerns about a potential lung infection. She had been previously seen by a pulmonologist, but had not followed up as she was under the impression that her condition was not severe.          Review of Systems  Constitutional:  Negative for chills, fatigue and fever.  HENT:  Positive for congestion, sinus pressure and sinus pain.   Respiratory:  Positive for cough. Negative for chest tightness, shortness of breath and wheezing.   Cardiovascular:  Negative for chest pain and palpitations.  Gastrointestinal:  Negative for abdominal pain, constipation and nausea.  Musculoskeletal:  Negative for back pain and myalgias.  Skin:  Negative for rash.  Neurological:  Negative for dizziness and light-headedness.  Hematological:   Negative for adenopathy. Does not bruise/bleed easily.  Psychiatric/Behavioral:  Negative for behavioral problems and confusion.    Past Medical History:  Diagnosis Date   Allergy    Anemia    Anxiety    Barrett esophagus    Bladder pain    Cataract    Chronic bronchitis (HCC)    per pt last episode july 2016   Complication of anesthesia    slow to wake   COPD (chronic obstructive pulmonary disease) (HCC)    Depression    Diverticulosis    Exocrine pancreatic insufficiency    Frequency of urination    GERD (gastroesophageal reflux disease)    Hiatal hernia    History of adenomatous polyp of colon    tubular adenoma   History of chronic gastritis    History of diverticulitis of colon    History of esophageal dilatation    for stricture   History of esophageal spasm    Hyperlipidemia    Hypertension    IBS (irritable bowel syndrome)    Interstitial cystitis    PAD (peripheral artery disease) (HCC)    Productive cough    intermittant   Smokers' cough (HCC)    Urgency of urination      Social History   Socioeconomic History   Marital status: Married    Spouse name: Not on file   Number of children: 3   Years of education: Not on file   Highest education level: Not on file  Occupational History   Not on file  Tobacco Use   Smoking status: Every Day    Current  packs/day: 0.50    Average packs/day: 0.7 packs/day for 47.8 years (35.7 ttl pk-yrs)    Types: Cigarettes    Start date: 2024   Smokeless tobacco: Never   Tobacco comments:    counseled about smoking cessation  Vaping Use   Vaping status: Never Used  Substance and Sexual Activity   Alcohol use: No    Alcohol/week: 0.0 standard drinks of alcohol   Drug use: No   Sexual activity: Not Currently    Birth control/protection: Post-menopausal  Other Topics Concern   Not on file  Social History Narrative   Not on file   Social Determinants of Health   Financial Resource Strain: Not on file  Food  Insecurity: Not on file  Transportation Needs: Not on file  Physical Activity: Not on file  Stress: Not on file  Social Connections: Not on file  Intimate Partner Violence: Not on file    Past Surgical History:  Procedure Laterality Date   ABDOMINAL AORTOGRAM N/A 10/14/2018   Procedure: ABDOMINAL AORTOGRAM;  Surgeon: Runell Gess, MD;  Location: [facility];  Service: Cardiovascular;  Laterality: N/A;   ABDOMINAL HYSTERECTOMY  1975   ANTERIOR CERVICAL DECOMP/DISCECTOMY FUSION N/A 09/07/2014   Procedure: Exploration of Fusion and Removal of Anterior Cervical Hematoma;  Surgeon: Tressie Stalker, MD;  Location: [facility] NEURO ORS;  Service: Neurosurgery;  Laterality: N/A;   CARPAL TUNNEL RELEASE Right 1993   COLONOSCOPY  last one 04-30-2013   COLONOSCOPY WITH PROPOFOL N/A 09/09/2019   Procedure: COLONOSCOPY WITH PROPOFOL;  Surgeon: Sherrilyn Rist, MD;  Location: [facility];  Service: Gastroenterology;  Laterality: N/A;   CYSTO WITH HYDRODISTENSION N/A 06/08/2015   Procedure: CYSTOSCOPY/HYDRODISTENSION INSTILLATION OF MARCAINE AND PYRIDIUM;  Surgeon: Bjorn Pippin, MD;  Location: [facility];  Service: Urology;  Laterality: N/A;   CYSTO/  HOD/  BLADDER BX  1990's   ESOPHAGOGASTRODUODENOSCOPY  last one 09-11-2012   LAPAROSCOPIC APPENDECTOMY N/A 09/21/2013   Procedure: APPENDECTOMY LAPAROSCOPIC;  Surgeon: Emelia Loron, MD;  Location: [facility] OR;  Service: General;  Laterality: N/A;   LAPAROSCOPIC CHOLECYSTECTOMY  2002   LAPAROSCOPY SIGMOID COLECTOMY  10-05-2008   diverticulitis   LOWER EXTREMITY ANGIOGRAPHY Left 10/14/2018   LOWER EXTREMITY ANGIOGRAPHY Bilateral 10/14/2018   Procedure: Lower Extremity Angiography;  Surgeon: Runell Gess, MD;  Location: [facility] INVASIVE CV LAB;  Service: Cardiovascular;  Laterality: Bilateral;  ILIACS   MICROLARYNGOSCOPY  09-27-2006   w/ true vocal cord stripping  and bilateral bx's of lesion's (benign)   PERIPHERAL VASCULAR INTERVENTION   10/14/2018   Procedure: PERIPHERAL VASCULAR INTERVENTION;  Surgeon: Runell Gess, MD;  Location: [facility] CV [facility];  Service: Cardiovascular;;   SINUS SURGERY WITH INSTATRAK  1991   TEMPOROMANDIBULAR JOINT SURGERY  1990   TONSILLECTOMY AND ADENOIDECTOMY  1964    Family History  Problem Relation Age of Onset   Pancreatic cancer Mother    Colon polyps Mother    Pancreatic cancer Father    Diabetes Father    Heart disease Father    Leukemia Father    Prostate cancer Father    Hypertension Father    Heart disease Brother    Heart attack Brother    Other Daughter        prediabetes   Heart disease Daughter    Diabetes Daughter    Colon cancer Neg Hx    Esophageal cancer Neg Hx    Rectal cancer Neg Hx    Stomach cancer Neg  Hx     Allergies  Allergen Reactions   Amoxicillin-Pot Clavulanate Shortness Of Breath, Itching and Swelling   Avelox [Moxifloxacin Hcl In Nacl] Swelling    Swelling, itching and shortness of breath   Ciprofloxacin Shortness Of Breath, Itching and Swelling   Keflex [Cephalexin] Shortness Of Breath and Swelling   Penicillins Shortness Of Breath, Itching and Swelling    Did it involve swelling of the face/tongue/throat, SOB, or low BP? Yes Did it involve sudden or severe rash/hives, skin peeling, or any reaction on the inside of your mouth or nose? No Did you need to seek medical attention at a hospital or doctor's office? Yes When did it last happen?      Several years If all above answers are "NO", may proceed with cephalosporin use.    Sulfa Antibiotics Shortness Of Breath, Itching and Swelling   Prednisone Other (See Comments)    GI irritation; is able to tolerate injections   Acyclovir And Related Other (See Comments)    Pt does not recall this reaction   Clindamycin/Lincomycin Itching and Swelling   Levaquin [Levofloxacin] Hives   Zyvox [Linezolid] Diarrhea    Current Outpatient Medications on File Prior to Visit  Medication Sig Dispense  Refill   aspirin EC 81 MG EC tablet Take 1 tablet (81 mg total) by mouth daily.     atorvastatin (LIPITOR) 40 MG tablet TAKE 1 TABLET EVERY DAY (NEED MD APPOINTMENT FOR REFILLS) 90 tablet 3   clopidogrel (PLAVIX) 75 MG tablet TAKE 1 TABLET EVERY DAY 90 tablet 3   dicyclomine (BENTYL) 10 MG capsule TAKE 1 CAPSULE BY MOUTH AS NEEDED FOR UP TO 1 DOSE FOR SPASMS. 90 capsule 0   Digestive Enzymes (DIGESTIVE ENZYME PO) Take by mouth daily. Patient taking after each meal     estradiol (CLIMARA - DOSED IN MG/24 HR) 0.1 mg/24hr patch Place 0.1 mg onto the skin every Monday.  0   Evolocumab (REPATHA SURECLICK) 140 MG/ML SOAJ INJECT 1 DOSE INTO THE SKIN EVERY 14 (FOURTEEN) DAYS. 2 mL 11   ezetimibe (ZETIA) 10 MG tablet Take 1 tablet (10 mg total) by mouth daily. 90 tablet 1   famotidine (PEPCID) 20 MG tablet Take 1 tablet (20 mg total) by mouth 2 (two) times daily. 180 tablet 3   fluticasone (FLONASE) 50 MCG/ACT nasal spray Place 2 sprays into both nostrils daily. 16 g 6   lisinopril (ZESTRIL) 20 MG tablet Take 1 tablet (20 mg total) by mouth daily. 90 tablet 1   LORazepam (ATIVAN) 0.5 MG tablet Take 0.5 mg by mouth at bedtime.   5   ondansetron (ZOFRAN) 4 MG tablet TAKE 1 TABLET (4 MG TOTAL) BY MOUTH EVERY 6 (SIX) HOURS AS NEEDED FOR NAUSEA OR VOMITING 45 tablet 2   pantoprazole (PROTONIX) 40 MG tablet TAKE 1 TABLET BY MOUTH EVERY DAY 90 tablet 1   polyethylene glycol (MIRALAX) 17 g packet Take 17 g by mouth 2 (two) times daily. 30 each 0   sertraline (ZOLOFT) 50 MG tablet Take 25 mg by mouth at bedtime.      Vitamin D, Ergocalciferol, (DRISDOL) 1.25 MG (50000 UNIT) CAPS capsule Take 1 capsule (50,000 Units total) by mouth every 7 (seven) days. 12 capsule 1   No current facility-administered medications on file prior to visit.    BP 130/64   Pulse 98   Temp 97.9 F (36.6 C)   Resp 18   Ht 5' (1.524 m)   Wt 88 lb 9.6 oz (  40.2 kg)   SpO2 96%   BMI 17.30 kg/m        Objective:   Physical  Exam  General- No acute distress. Pleasant patient. Neck- Full range of motion, no jvd Lungs- Clear, even and unlabored but shallow. Heart- regular rate and rhythm. Neurologic- CNII- XII grossly intact.  Heent- left maxillary sinus pressure to palpation. No frontal sinus pressure. Lower ext- no pedal edema. Calves symmetric. Negative homans sign.      Assessment & Plan:   Assessment and Plan    Sinusitis and Bronchitis Two-week history of sinus pressure and congestion, now with productive cough and chest discomfort. No fever. Clear nasal discharge, light yellow sputum. History of smoking. -Start Doxycycline 100mg  twice daily for 10 days with food. -Prescribe Hycodan syrup 5ml every 6-8 hours as needed for cough. Caution about potential drowsiness.  Chronic Lung Disease (Emphysema and Bronchial Wall Thickening) Noted on CT chest from 2021. Patient has a history of smoking. -Refer back to pulmonology for follow-up of CT findings. -Prescribe Albuterol inhaler for use if wheezing develops.  Follow-up -Schedule follow-up appointment in 7-10 days. -If symptoms worsen (increased chest congestion, fever, increased sputum production), order chest x-ray. -If symptoms improve significantly, patient may cancel follow-up appointment.       Esperanza Richters, PA-C

## 2023-06-04 NOTE — Patient Instructions (Signed)
Sinusitis and Bronchitis Two-week history of sinus pressure and congestion, now with productive cough and chest discomfort. No fever. Clear nasal discharge, light yellow sputum. History of smoking. -Start Doxycycline 100mg  twice daily for 10 days with food. -Prescribe Hycodan syrup 5ml every 6-8 hours as needed for cough. Caution about potential drowsiness.  Chronic Lung Disease (Emphysema and Bronchial Wall Thickening) Noted on CT chest from 2021. Patient has a history of smoking. -Refer back to pulmonology for follow-up of CT findings. -Prescribe Albuterol inhaler for use if wheezing develops.  Follow-up -Schedule follow-up appointment in 7-10 days. -If symptoms worsen (increased chest congestion, fever, increased sputum production), order chest x-ray. -If symptoms improve significantly, patient may cancel follow-up appointment.

## 2023-06-05 ENCOUNTER — Telehealth: Payer: Self-pay | Admitting: Family Medicine

## 2023-06-05 ENCOUNTER — Ambulatory Visit: Payer: Medicare HMO | Admitting: Gastroenterology

## 2023-06-05 NOTE — Telephone Encounter (Signed)
Pt called and explained that she saw Ramon Dredge yesterday for Bronchitis sxs and he prescribed her Hydrocodone. However, she stated that it caused an outbreak on her skin and that she may be having an allergic reaction. Pt would like something else sent in to the pharmacy. Please call and advise.

## 2023-06-05 NOTE — Telephone Encounter (Signed)
Spoke with patient. She states after taking cough syrup this morning she broke out in a rash on both arms. This only lasted about an hour and is gone now. No other symptoms (swelling, SOB, etc).  Hydrocodone added to allergy list.   She is okay with using an OTC cough suppressant, but has high blood pressure and wants to know what is safe to take. Declined Tessalon.   Please advise.

## 2023-06-05 NOTE — Addendum Note (Signed)
Addended bySilvio Pate on: 06/05/2023 02:27 PM   Modules accepted: Orders

## 2023-06-06 NOTE — Telephone Encounter (Signed)
Pt called and lvm to return call 

## 2023-06-07 ENCOUNTER — Other Ambulatory Visit: Payer: Self-pay | Admitting: Medical

## 2023-06-18 DIAGNOSIS — F325 Major depressive disorder, single episode, in full remission: Secondary | ICD-10-CM | POA: Diagnosis not present

## 2023-07-02 ENCOUNTER — Other Ambulatory Visit: Payer: Self-pay

## 2023-07-02 ENCOUNTER — Emergency Department (HOSPITAL_BASED_OUTPATIENT_CLINIC_OR_DEPARTMENT_OTHER): Payer: Medicare HMO

## 2023-07-02 ENCOUNTER — Encounter (HOSPITAL_BASED_OUTPATIENT_CLINIC_OR_DEPARTMENT_OTHER): Payer: Self-pay | Admitting: Emergency Medicine

## 2023-07-02 ENCOUNTER — Emergency Department (HOSPITAL_BASED_OUTPATIENT_CLINIC_OR_DEPARTMENT_OTHER)
Admission: EM | Admit: 2023-07-02 | Discharge: 2023-07-02 | Disposition: A | Discharge status: Home / Self Care | Payer: Medicare HMO | Attending: Emergency Medicine | Admitting: Emergency Medicine

## 2023-07-02 DIAGNOSIS — R0781 Pleurodynia: Secondary | ICD-10-CM | POA: Insufficient documentation

## 2023-07-02 DIAGNOSIS — M25511 Pain in right shoulder: Secondary | ICD-10-CM | POA: Insufficient documentation

## 2023-07-02 DIAGNOSIS — J449 Chronic obstructive pulmonary disease, unspecified: Secondary | ICD-10-CM | POA: Diagnosis not present

## 2023-07-02 DIAGNOSIS — M51369 Other intervertebral disc degeneration, lumbar region without mention of lumbar back pain or lower extremity pain: Secondary | ICD-10-CM | POA: Diagnosis not present

## 2023-07-02 DIAGNOSIS — S3992XA Unspecified injury of lower back, initial encounter: Secondary | ICD-10-CM | POA: Diagnosis not present

## 2023-07-02 DIAGNOSIS — M25512 Pain in left shoulder: Secondary | ICD-10-CM | POA: Diagnosis not present

## 2023-07-02 DIAGNOSIS — M542 Cervicalgia: Secondary | ICD-10-CM | POA: Insufficient documentation

## 2023-07-02 DIAGNOSIS — R0789 Other chest pain: Secondary | ICD-10-CM | POA: Diagnosis not present

## 2023-07-02 DIAGNOSIS — M791 Myalgia, unspecified site: Secondary | ICD-10-CM | POA: Diagnosis not present

## 2023-07-02 DIAGNOSIS — M545 Low back pain, unspecified: Secondary | ICD-10-CM | POA: Diagnosis not present

## 2023-07-02 DIAGNOSIS — Z7982 Long term (current) use of aspirin: Secondary | ICD-10-CM | POA: Diagnosis not present

## 2023-07-02 DIAGNOSIS — Z7901 Long term (current) use of anticoagulants: Secondary | ICD-10-CM | POA: Diagnosis not present

## 2023-07-02 DIAGNOSIS — J439 Emphysema, unspecified: Secondary | ICD-10-CM | POA: Diagnosis not present

## 2023-07-02 DIAGNOSIS — E041 Nontoxic single thyroid nodule: Secondary | ICD-10-CM | POA: Diagnosis not present

## 2023-07-02 DIAGNOSIS — M549 Dorsalgia, unspecified: Secondary | ICD-10-CM | POA: Diagnosis not present

## 2023-07-02 DIAGNOSIS — I2510 Atherosclerotic heart disease of native coronary artery without angina pectoris: Secondary | ICD-10-CM | POA: Diagnosis not present

## 2023-07-02 DIAGNOSIS — Z981 Arthrodesis status: Secondary | ICD-10-CM | POA: Diagnosis not present

## 2023-07-02 DIAGNOSIS — R079 Chest pain, unspecified: Secondary | ICD-10-CM | POA: Diagnosis not present

## 2023-07-02 LAB — CBC WITH DIFFERENTIAL/PLATELET
Abs Immature Granulocytes: 0.01 10*3/uL (ref 0.00–0.07)
Basophils Absolute: 0.1 10*3/uL (ref 0.0–0.1)
Basophils Relative: 1 %
Eosinophils Absolute: 0.2 10*3/uL (ref 0.0–0.5)
Eosinophils Relative: 3 %
HCT: 38 % (ref 36.0–46.0)
Hemoglobin: 12.6 g/dL (ref 12.0–15.0)
Immature Granulocytes: 0 %
Lymphocytes Relative: 25 %
Lymphs Abs: 1.6 10*3/uL (ref 0.7–4.0)
MCH: 30.6 pg (ref 26.0–34.0)
MCHC: 33.2 g/dL (ref 30.0–36.0)
MCV: 92.2 fL (ref 80.0–100.0)
Monocytes Absolute: 0.4 10*3/uL (ref 0.1–1.0)
Monocytes Relative: 7 %
Neutro Abs: 4.2 10*3/uL (ref 1.7–7.7)
Neutrophils Relative %: 64 %
Platelets: 212 10*3/uL (ref 150–400)
RBC: 4.12 MIL/uL (ref 3.87–5.11)
RDW: 12.8 % (ref 11.5–15.5)
WBC: 6.5 10*3/uL (ref 4.0–10.5)
nRBC: 0 % (ref 0.0–0.2)

## 2023-07-02 LAB — I-STAT CHEM 8, ED
BUN: 6 mg/dL — ABNORMAL LOW (ref 8–23)
Calcium, Ion: 1.16 mmol/L (ref 1.15–1.40)
Chloride: 101 mmol/L (ref 98–111)
Creatinine, Ser: 0.8 mg/dL (ref 0.44–1.00)
Glucose, Bld: 124 mg/dL — ABNORMAL HIGH (ref 70–99)
HCT: 39 % (ref 36.0–46.0)
Hemoglobin: 13.3 g/dL (ref 12.0–15.0)
Potassium: 3.3 mmol/L — ABNORMAL LOW (ref 3.5–5.1)
Sodium: 140 mmol/L (ref 135–145)
TCO2: 25 mmol/L (ref 22–32)

## 2023-07-02 LAB — TROPONIN I (HIGH SENSITIVITY)
Troponin I (High Sensitivity): 3 ng/L (ref <18)
Troponin I (High Sensitivity): 3 ng/L (ref <18)

## 2023-07-02 LAB — D-DIMER, QUANTITATIVE: D-Dimer, Quant: 0.47 ug{FEU}/mL (ref 0.00–0.50)

## 2023-07-02 MED ORDER — IOHEXOL 350 MG/ML SOLN
75.0000 mL | Freq: Once | INTRAVENOUS | Status: AC | PRN
  Administered 2023-07-02: 75 mL via INTRAVENOUS

## 2023-07-02 MED ORDER — ONDANSETRON HCL 4 MG/2ML IJ SOLN
4.0000 mg | Freq: Once | INTRAMUSCULAR | Status: AC
Start: 2023-07-02 — End: 2023-07-02
  Administered 2023-07-02: 4 mg via INTRAVENOUS
  Filled 2023-07-02: qty 2

## 2023-07-02 MED ORDER — DEXAMETHASONE SODIUM PHOSPHATE 10 MG/ML IJ SOLN
10.0000 mg | Freq: Once | INTRAMUSCULAR | Status: AC
Start: 2023-07-02 — End: 2023-07-02
  Administered 2023-07-02: 10 mg via INTRAVENOUS
  Filled 2023-07-02: qty 1

## 2023-07-02 MED ORDER — METHOCARBAMOL 500 MG PO TABS
500.0000 mg | ORAL_TABLET | Freq: Three times a day (TID) | ORAL | 0 refills | Status: DC | PRN
Start: 2023-07-02 — End: 2023-09-03

## 2023-07-02 MED ORDER — FENTANYL CITRATE PF 50 MCG/ML IJ SOSY
50.0000 ug | PREFILLED_SYRINGE | Freq: Once | INTRAMUSCULAR | Status: AC
  Administered 2023-07-02: 50 ug via INTRAVENOUS
  Filled 2023-07-02: qty 1

## 2023-07-02 NOTE — ED Triage Notes (Signed)
Left rib pain X 2 weeks with back and shoulder pain X 1 week. Denies injury but states had to take care of her husband for 9 days prior. Including helping transfer and changing him.

## 2023-07-02 NOTE — ED Provider Notes (Signed)
[facility] Provider Note   CSN: [id number] Arrival date & time: 07/02/23  0220     History  Chief Complaint  Patient presents with   Back Pain    Leah [name] is a 77 y.o. female.  Patient with a history of COPD, PAD, IBS and GERD presenting with multiple areas of pain for several weeks.  She denies any fall or injury but states she was moving her now deceased husband around about a month ago.  She has low back pain, bilateral shoulder pain, neck pain, left-sided rib pain.  Pain in her neck is associated with burning across both shoulder blades and down her arms.  Has had C-spine surgery remotely.  Pain is constant and radiates down both arms associated with burning sensation but no numbness or tingling.  No weakness.  Left-sided rib pain worse with inspiration and movement.  Denies chest pain.  Denies abdominal pain, fever, nausea or vomiting. Ongoing low back pain as well for the past 2 weeks without radiation of the pain down her legs.  No new weakness, numbness or tingling.  No bowel or bladder incontinence.  No fever or vomiting.  No history of IV drug abuse or cancer.  Has been taking Tylenol at home for the pain without relief.  States she cannot take anti-inflammatories due to being on Plavix and stomach issues.  The history is provided by the patient and a relative.  Back Pain Associated symptoms: no abdominal pain, no chest pain, no dysuria, no fever, no headaches and no weakness        Home Medications Prior to Admission medications   Medication Sig Start Date End Date Taking? Authorizing Provider  albuterol (VENTOLIN HFA) 108 (90 Base) MCG/ACT inhaler TAKE 2 PUFFS BY MOUTH EVERY 6 HOURS AS NEEDED 06/08/23 07/08/23  Saguier, Ramon Dredge, PA-C  aspirin EC 81 MG EC tablet Take 1 tablet (81 mg total) by mouth daily. 10/15/18   Arty Baumgartner, NP  atorvastatin (LIPITOR) 40 MG tablet TAKE 1 TABLET EVERY DAY (NEED MD APPOINTMENT  FOR REFILLS) 04/20/23   Runell Gess, MD  clopidogrel (PLAVIX) 75 MG tablet TAKE 1 TABLET EVERY DAY 09/06/22   Runell Gess, MD  dicyclomine (BENTYL) 10 MG capsule TAKE 1 CAPSULE BY MOUTH AS NEEDED FOR UP TO 1 DOSE FOR SPASMS. 06/12/22   Sherrilyn Rist, MD  Digestive Enzymes (DIGESTIVE ENZYME PO) Take by mouth daily. Patient taking after each meal    [provider]  doxycycline (VIBRA-TABS) 100 MG tablet Take 1 tablet (100 mg total) by mouth 2 (two) times daily. 06/04/23   Saguier, Ramon Dredge, PA-C  estradiol (CLIMARA - DOSED IN MG/24 HR) 0.1 mg/24hr patch Place 0.1 mg onto the skin every Monday. 02/08/16   [provider]  Evolocumab (REPATHA SURECLICK) 140 MG/ML SOAJ INJECT 1 DOSE INTO THE SKIN EVERY 14 (FOURTEEN) DAYS. 12/01/22   Hilty, Lisette Abu, MD  ezetimibe (ZETIA) 10 MG tablet Take 1 tablet (10 mg total) by mouth daily. 03/06/23   Donato Schultz, DO  famotidine (PEPCID) 20 MG tablet Take 1 tablet (20 mg total) by mouth 2 (two) times daily. 03/06/23   Seabron Spates R, DO  fluticasone (FLONASE) 50 MCG/ACT nasal spray Place 2 sprays into both nostrils daily. 06/28/21   Seabron Spates R, DO  lisinopril (ZESTRIL) 20 MG tablet Take 1 tablet (20 mg total) by mouth daily. 03/06/23   Donato Schultz,  DO  LORazepam (ATIVAN) 0.5 MG tablet Take 0.5 mg by mouth at bedtime.  12/04/14   [provider]  ondansetron (ZOFRAN) 4 MG tablet TAKE 1 TABLET (4 MG TOTAL) BY MOUTH EVERY 6 (SIX) HOURS AS NEEDED FOR NAUSEA OR VOMITING 06/12/22   Sherrilyn Rist, MD  pantoprazole (PROTONIX) 40 MG tablet TAKE 1 TABLET BY MOUTH EVERY DAY 04/09/23   Zola Button, Grayling Congress, DO  polyethylene glycol (MIRALAX) 17 g packet Take 17 g by mouth 2 (two) times daily. 09/09/19   Narda Bonds, MD  sertraline (ZOLOFT) 50 MG tablet Take 25 mg by mouth at bedtime.     [provider]  Vitamin D, Ergocalciferol, (DRISDOL) 1.25 MG (50000 UNIT) CAPS capsule Take 1 capsule (50,000  Units total) by mouth every 7 (seven) days. 03/09/23   Donato Schultz, DO      Allergies    Amoxicillin-pot clavulanate, Avelox [moxifloxacin hcl in nacl], Ciprofloxacin, Keflex [cephalexin], Penicillins, Sulfa antibiotics, Prednisone, Acyclovir and related, Clindamycin/lincomycin, Levaquin [levofloxacin], Zyvox [linezolid], and Hydrocodone    Review of Systems   Review of Systems  Constitutional:  Negative for activity change, appetite change and fever.  HENT:  Negative for congestion.   Respiratory:  Negative for cough, chest tightness and shortness of breath.   Cardiovascular:  Negative for chest pain.  Gastrointestinal:  Negative for abdominal pain, nausea and vomiting.  Genitourinary:  Negative for dysuria and hematuria.  Musculoskeletal:  Positive for arthralgias, back pain, myalgias and neck pain.  Skin:  Negative for rash.  Neurological:  Negative for dizziness, weakness and headaches.   all other systems are negative except as noted in the HPI and PMH.    Physical Exam Updated Vital Signs BP (!) 147/81 (BP Location: Right Arm)   Pulse 78   Temp 97.6 F (36.4 C) (Oral)   Resp 20   Ht 5' 0.5" (1.537 m)   Wt 39.9 kg   SpO2 96%   BMI 16.90 kg/m  Physical Exam Vitals and nursing note reviewed.  Constitutional:      General: She is not in acute distress.    Appearance: She is well-developed.  HENT:     Head: Normocephalic and atraumatic.     Mouth/Throat:     Pharynx: No oropharyngeal exudate.  Eyes:     Conjunctiva/sclera: Conjunctivae normal.     Pupils: Pupils are equal, round, and reactive to light.  Neck:     Comments: Paraspinal cervical tenderness bilaterally, no midline tenderness Cardiovascular:     Rate and Rhythm: Normal rate and regular rhythm.     Heart sounds: Normal heart sounds. No murmur heard. Pulmonary:     Effort: Pulmonary effort is normal. No respiratory distress.     Breath sounds: Normal breath sounds.     Comments: Left lower  lateral rib tenderness, no crepitus or ecchymosis Chest:     Chest wall: Tenderness present.  Abdominal:     Palpations: Abdomen is soft.     Tenderness: There is no abdominal tenderness. There is no guarding or rebound.  Musculoskeletal:        General: Tenderness present. Normal range of motion.     Cervical back: Normal range of motion and neck supple.     Comments: Midline lumbar spine pain without step-off or deformity  5/5 strength in bilateral lower extremities. Ankle plantar and dorsiflexion intact. Great toe extension intact bilaterally. +2 DP and PT pulses. +2 patellar reflexes bilaterally. Normal gait.  Skin:    General: Skin is warm.  Neurological:     Mental Status: She is alert and oriented to person, place, and time.     Cranial Nerves: No cranial nerve deficit.     Motor: No abnormal muscle tone.     Coordination: Coordination normal.     Comments:  5/5 strength throughout. CN 2-12 intact.Equal grip strength.   Psychiatric:        Behavior: Behavior normal.     ED Results / Procedures / Treatments   Labs (all labs ordered are listed, but only abnormal results are displayed) Labs Reviewed  I-STAT CHEM 8, ED - Abnormal; Notable for the following components:      Result Value   Potassium 3.3 (*)    BUN 6 (*)    Glucose, Bld 124 (*)    All other components within normal limits  CBC WITH DIFFERENTIAL/PLATELET  D-DIMER, QUANTITATIVE  TROPONIN I (HIGH SENSITIVITY)  TROPONIN I (HIGH SENSITIVITY)    EKG EKG Interpretation Date/Time:  Monday July 02 2023 03:28:13 EST Ventricular Rate:  68 PR Interval:  127 QRS Duration:  122 QT Interval:  418 QTC Calculation: 445 R Axis:   74  Text Interpretation: Sinus rhythm Nonspecific intraventricular conduction delay No significant change was found Confirmed by Glynn Octave [phone]) on 07/02/2023 3:47:55 AM  Radiology CT Angio Chest PE W and/or Wo Contrast  Result Date: 07/02/2023 CLINICAL DATA:  Positive  D-dimer with left chest pain, back pain and shoulder pain. EXAM: CT ANGIOGRAPHY CHEST WITH CONTRAST TECHNIQUE: Multidetector CT imaging of the chest was performed using the standard protocol during bolus administration of intravenous contrast. Multiplanar CT image reconstructions and MIPs were obtained to evaluate the vascular anatomy. RADIATION DOSE REDUCTION: This exam was performed according to the departmental dose-optimization program which includes automated exposure control, adjustment of the mA and/or kV according to patient size and/or use of iterative reconstruction technique. CONTRAST:  75mL OMNIPAQUE IOHEXOL 350 MG/ML SOLN COMPARISON:  AP and lateral chest today, lung cancer screening chest CT 10/13/2019. FINDINGS: Cardiovascular: Pulmonary arteries are normal in caliber and do not show evidence of embolic disease. There is three-vessel coronary artery atherosclerosis, normal heart size and no substantial pericardial effusion. There are calcifications also noted in the outer mitral ring. Moderate calcific plaques of the aorta and great vessels without aortic aneurysm or dissection. The pulmonary veins are nondilated. Mediastinum/Nodes: No enlarged mediastinal, hilar, or axillary lymph nodes. The right lobe of the thyroid gland, trachea, and esophagus demonstrate no significant findings. In the left lobe of the thyroid gland, a 1.2 cm hypodense nodule is again noted. No follow-up imaging is recommended. The main bronchi are clear. Lungs/Pleura: Moderate to severe emphysematous disease with centrilobular changes predominating, small amount of paraseptal disease in the apices. Asymmetric right apical pleural-parenchymal scar-like opacity is unchanged. Scattered linear scarring in the bases. Small Bochdalek's fat herniation through the posteromedial right hemidiaphragm into the lower chest. No pleural effusion. No consolidation or nodules are seen. There is mild central bronchial thickening without  appreciable bronchial plugs. Upper Abdomen: No acute abnormality. Status post cholecystectomy. There is a 1.9 cm cyst (10.7 Hounsfield units) medially in hepatic segment 6. There is a 9 mm cyst in hepatic segment 2 (Hounsfield density 17.6). Abdominal aortic atherosclerosis. Musculoskeletal: Postsurgical change is partially visible in the lower cervical spine. There is thoracic kyphosis and mild degenerative changes. No acute or other significant osseous findings. Review of the MIP images confirms the above findings. IMPRESSION: 1. No  evidence of arterial dilatation or embolus. 2. Aortic and coronary artery atherosclerosis. 3. Emphysema. 4. Mild central bronchial thickening without evidence of bronchitis or pneumonia. 5. Hepatic cysts. Aortic Atherosclerosis (ICD10-I70.0) and Emphysema (ICD10-J43.9). Electronically Signed   By: Almira Bar M.D.   On: 07/02/2023 06:02   CT Lumbar Spine Wo Contrast  Result Date: 07/02/2023 CLINICAL DATA:  Back trauma. EXAM: CT LUMBAR SPINE WITHOUT CONTRAST TECHNIQUE: Multidetector CT imaging of the lumbar spine was performed without intravenous contrast administration. Multiplanar CT image reconstructions were also generated. RADIATION DOSE REDUCTION: This exam was performed according to the departmental dose-optimization program which includes automated exposure control, adjustment of the mA and/or kV according to patient size and/or use of iterative reconstruction technique. COMPARISON:  None Available. FINDINGS: Segmentation: 5 lumbar type vertebrae. Alignment: Normal. Vertebrae: No acute fracture or focal pathologic process. Paraspinal and other soft tissues: No perispinal hematoma or swelling. Extensive atherosclerosis. Disc levels: Degenerative facet spurring at L4-5 and L5-S1. Mild disc bulging and narrowing at the same levels. IMPRESSION: No acute finding. Electronically Signed   By: Tiburcio Pea M.D.   On: 07/02/2023 05:34   CT Cervical Spine Wo Contrast  Result  Date: 07/02/2023 CLINICAL DATA:  Acute neck pain.  Prior cervical surgery. EXAM: CT CERVICAL SPINE WITHOUT CONTRAST TECHNIQUE: Multidetector CT imaging of the cervical spine was performed without intravenous contrast. Multiplanar CT image reconstructions were also generated. RADIATION DOSE REDUCTION: This exam was performed according to the departmental dose-optimization program which includes automated exposure control, adjustment of the mA and/or kV according to patient size and/or use of iterative reconstruction technique. COMPARISON:  10/09/2017 FINDINGS: Alignment: Normal. Skull base and vertebrae: No acute fracture. No primary bone lesion or focal pathologic process. C4-5 and C5-6 ACDF with solid arthrodesis. Soft tissues and spinal canal: No prevertebral fluid or swelling. No visible canal hematoma. Disc levels: Disc narrowing with ridging at C3-4 especially. No high-grade bony stenosis throughout the cervical spine. Upper chest: Centrilobular and panlobular emphysema. Opacity with volume loss at the right apex, there is pending dedicated chest CT. IMPRESSION: 1. No acute finding. 2. Uncomplicated C4 to C6 ACDF. Electronically Signed   By: Tiburcio Pea M.D.   On: 07/02/2023 05:33   DG Lumbar Spine Complete  Result Date: 07/02/2023 CLINICAL DATA:  Back pain for 1 week EXAM: LUMBAR SPINE - COMPLETE 4 VIEW COMPARISON:  01/12/2022 FINDINGS: Degenerative facet spurring at L4-5 and L5-S1. No evidence of fracture or bone lesion. Subjective osteopenia. Extensive atheromatous calcification with left iliac stenting. IMPRESSION: No acute finding. Electronically Signed   By: Tiburcio Pea M.D.   On: 07/02/2023 04:18   DG Chest 2 View  Result Date: 07/02/2023 CLINICAL DATA:  Chest pain.  Left rib cage pain. EXAM: CHEST - 2 VIEW COMPARISON:  03/01/2017 FINDINGS: Hyperinflation and apical lucency from emphysema. Mild linear scarring in the bilateral lungs. Normal heart size and mediastinal contours. There is no  edema, consolidation, effusion, or pneumothorax. IMPRESSION: COPD without acute superimposed finding. Electronically Signed   By: Tiburcio Pea M.D.   On: 07/02/2023 04:17    Procedures Procedures    Medications Ordered in ED Medications  fentaNYL (SUBLIMAZE) injection 50 mcg (has no administration in time range)    ED Course/ Medical Decision Making/ A&P                                 Medical Decision Making Amount and/or Complexity of Data  Reviewed Independent Historian: caregiver Labs: ordered. Decision-making details documented in ED Course. Radiology: ordered and independent interpretation performed. Decision-making details documented in ED Course. ECG/medicine tests: ordered and independent interpretation performed. Decision-making details documented in ED Course.  Risk Prescription drug management.  2 weeks of atraumatic neck and back pain.  Equal distal strength, sensation, pulses.  Denies chest pain.  Low concern for cord compression or cauda equina  Intact distal pulses.  Low concern for acute limb ischemia. CT scan in 2023 showed no evidence of aortic aneurysm.  EKG is sinus rhythm.  No acute ST changes.  She has had 2 weeks of constant left rib pain worse with movement.  Will obtain chest x-ray as well as basic labs to rule out MI, ACS and PE.  Troponin negative.  Chest x-ray negative for infiltrate or rib fracture or pneumothorax.  Results reviewed and interpreted by me.  With her ongoing neck and back pain, CT imaging is obtained that shows stable postsurgical changes.  No evidence of fracture or dislocation.  No evidence of significant foraminal stenosis or cord impingement.  Patient feels improved after receiving anti-inflammatories and muscle relaxers.  States with her stents and Plavix she is not allowed to take any kind of NSAIDs.  She also cannot take oral steroids secondary to stomach issues but can take IV steroids.  Pain is improved in the ED.  Low  suspicion for cord compression or cauda equina.  No evidence of ACS.  Troponin negative x 2.  CT is negative for PE, aortic dissection, or pneumonia.  Suspect likely musculoskeletal soreness in the setting of recent lifting and twisting injuries.  No neurological deficits.  No evidence of ACS.  She is agreeable to a dose of IV steroids here and an outpatient follow-up with anti-inflammatories and muscle relaxers  Follow-up with PCP.  Discussed she may require MRI of her C-spine if symptoms not improved.  No indication for emergent MRI today which would necessitate transfer.  Return to the ED with difficulty breathing, chest pain, weakness, numbness, tingling, difficulty speaking, difficulty swallowing or other concerns.        Final Clinical Impression(s) / ED Diagnoses Final diagnoses:  Acute midline low back pain without sciatica  Myalgia    Rx / DC Orders ED Discharge Orders     None         Rashaud Ybarbo, Jeannett Senior, MD 07/02/23 [phone]

## 2023-07-02 NOTE — Discharge Instructions (Addendum)
Your testing is negative for fracture or other serious injury.  No evidence of spinal cord problem.  No evidence of heart attack, blood clot in the lung or broken rib. take the anti-inflammatories and muscle relaxers as prescribed.  Follow-up with your primary doctor.  Return to the ED with worsening pain, fever, bowel or bladder incontinence, weakness, numbness, tingling or other concerns.

## 2023-07-03 ENCOUNTER — Encounter: Payer: Self-pay | Admitting: Family Medicine

## 2023-07-03 ENCOUNTER — Ambulatory Visit: Payer: Medicare HMO | Admitting: Family Medicine

## 2023-07-03 VITALS — BP 110/60 | HR 97 | Temp 97.8°F | Resp 18 | Ht 60.5 in | Wt 87.6 lb

## 2023-07-03 DIAGNOSIS — M47812 Spondylosis without myelopathy or radiculopathy, cervical region: Secondary | ICD-10-CM | POA: Diagnosis not present

## 2023-07-03 DIAGNOSIS — M792 Neuralgia and neuritis, unspecified: Secondary | ICD-10-CM

## 2023-07-03 DIAGNOSIS — M5416 Radiculopathy, lumbar region: Secondary | ICD-10-CM

## 2023-07-03 DIAGNOSIS — R1012 Left upper quadrant pain: Secondary | ICD-10-CM

## 2023-07-03 NOTE — Progress Notes (Unsigned)
Established Patient Office Visit  Subjective   Patient ID: Leah [name], female    DOB: [birth year]  Age: 77 y.o. MRN: [id number]  Chief Complaint  Patient presents with   ER Follow up    HPI Discussed the use of AI scribe software for clinical note transcription with the patient, who gave verbal consent to proceed.  History of Present Illness   The patient presents with a chief complaint of persistent burning pain in the back, shoulders, and arms, along with a sensation of cold. She denies any recent falls or injuries. However, she mentions having to physically assist her recently deceased husband, which involved heavy lifting. She speculates this might have contributed to her current discomfort.  The patient also reports rib pain, which she describes as a sharp sensation. She denies any shortness of breath but mentions a cough producing thick, yellow sputum. Despite these symptoms, she denies any significant respiratory distress.  The patient's pain seems to have started around the time of her husband's funeral, with intermittent periods of relief. However, she notes a progressive worsening of the pain, particularly a burning sensation in the back. She was given a muscle relaxer and other medications in the ER, but these have not provided significant relief.  The patient also reports feeling weak in both legs, although she denies any associated pain. She expresses feelings of nervousness and shakiness, which she attributes to her pain.  The patient has a history of neck surgery, during which a titanium plate was inserted. She denies any previous surgery on her lower back. She also mentions a history of sinus issues and a sensation of fullness in her ear.  The patient has been taking multiple medications for her stomach, including Pepcid, pantoprazole, and Carafate. She reports burping and pain around her stomach area. Despite these symptoms, she denies any heartburn.  The patient's  husband had liver cancer, and she was his primary caregiver until his death. She denies any need for counseling or feelings of depression, stating she is trying to cope as best as she can.        Review of Systems  Constitutional:  Negative for fever and malaise/fatigue.  HENT:  Negative for congestion.   Eyes:  Negative for blurred vision.  Respiratory:  Negative for cough and shortness of breath.   Cardiovascular:  Negative for chest pain, palpitations and leg swelling.  Gastrointestinal:  Negative for abdominal pain, blood in stool, nausea and vomiting.  Genitourinary:  Negative for dysuria and frequency.  Musculoskeletal:  Positive for back pain, myalgias and neck pain. Negative for falls.  Skin:  Negative for rash.  Neurological:  Negative for dizziness, loss of consciousness and headaches.  Endo/Heme/Allergies:  Negative for environmental allergies.  Psychiatric/Behavioral:  Negative for depression. The patient is not nervous/anxious.       Objective:     BP 110/60 (BP Location: Left Arm, Patient Position: Sitting, Cuff Size: Normal)   Pulse 97   Temp 97.8 F (36.6 C) (Oral)   Resp 18   Ht 5' 0.5" (1.537 m)   Wt 87 lb 9.6 oz (39.7 kg)   SpO2 94%   BMI 16.83 kg/m    Physical Exam Vitals and nursing note reviewed.  Constitutional:      General: She is not in acute distress.    Appearance: Normal appearance. She is well-developed.  HENT:     Head: Normocephalic and atraumatic.  Eyes:     General: No scleral icterus.  Right eye: No discharge.        Left eye: No discharge.  Cardiovascular:     Rate and Rhythm: Normal rate and regular rhythm.     Heart sounds: No murmur heard. Pulmonary:     Effort: Pulmonary effort is normal. No respiratory distress.     Breath sounds: Normal breath sounds.  Abdominal:     Tenderness: There is abdominal tenderness. There is guarding.  Musculoskeletal:        General: Normal range of motion.     Cervical back: Normal  range of motion and neck supple. Tenderness present.     Right lower leg: No edema.     Left lower leg: No edema.  Skin:    General: Skin is warm and dry.  Neurological:     Mental Status: She is alert and oriented to person, place, and time.  Psychiatric:        Mood and Affect: Mood normal.        Behavior: Behavior normal.        Thought Content: Thought content normal.        Judgment: Judgment normal.      Results for orders placed or performed in visit on 07/03/23  Amylase  Result Value Ref Range   Amylase 21 (L) 27 - 131 U/L  CBC with Differential/Platelet  Result Value Ref Range   WBC 7.9 4.0 - 10.5 K/uL   RBC 4.29 3.87 - 5.11 Mil/uL   Hemoglobin 13.4 12.0 - 15.0 g/dL   HCT 84.6 96.2 - 95.2 %   MCV 94.3 78.0 - 100.0 fl   MCHC 33.2 30.0 - 36.0 g/dL   RDW 84.1 32.4 - 40.1 %   Platelets 251.0 150.0 - 400.0 K/uL   Neutrophils Relative % 68.5 43.0 - 77.0 %   Lymphocytes Relative 23.5 12.0 - 46.0 %   Monocytes Relative 5.7 3.0 - 12.0 %   Eosinophils Relative 1.4 0.0 - 5.0 %   Basophils Relative 0.9 0.0 - 3.0 %   Neutro Abs 5.4 1.4 - 7.7 K/uL   Lymphs Abs 1.8 0.7 - 4.0 K/uL   Monocytes Absolute 0.5 0.1 - 1.0 K/uL   Eosinophils Absolute 0.1 0.0 - 0.7 K/uL   Basophils Absolute 0.1 0.0 - 0.1 K/uL  Comprehensive metabolic panel  Result Value Ref Range   Sodium 140 135 - 145 mEq/L   Potassium 3.6 3.5 - 5.1 mEq/L   Chloride 100 96 - 112 mEq/L   CO2 34 (H) 19 - 32 mEq/L   Glucose, Bld 193 (H) 70 - 99 mg/dL   BUN 13 6 - 23 mg/dL   Creatinine, Ser 0.27 0.40 - 1.20 mg/dL   Total Bilirubin 0.3 0.2 - 1.2 mg/dL   Alkaline Phosphatase 57 39 - 117 U/L   AST 16 0 - 37 U/L   ALT 13 0 - 35 U/L   Total Protein 6.2 6.0 - 8.3 g/dL   Albumin 4.0 3.5 - 5.2 g/dL   GFR 25.36 >64.40 mL/min   Calcium 9.2 8.4 - 10.5 mg/dL  Lipase  Result Value Ref Range   Lipase 28.0 11.0 - 59.0 U/L      The 10-year ASCVD risk score (Arnett DK, et al., 2019) is: 21.8%    Assessment & Plan:    Problem List Items Addressed This Visit   None Visit Diagnoses     Lumbar back pain with radiculopathy affecting lower extremity    -  Primary   Relevant Orders  MR Lumbar Spine Wo Contrast   Ambulatory referral to Neurosurgery   Cervical spine arthritis with nerve pain       Relevant Orders   MR Cervical Spine Wo Contrast   Ambulatory referral to Neurosurgery   Abdominal pain, LUQ       Relevant Orders   US Abdomen Complete (Completed)   Amylase (Completed)   CBC with Differential/Platelet (Completed)   Comprehensive metabolic panel (Completed)   Lipase (Completed)     Assessment and Plan    Cervical and Lumbar Spinal Stenosis   She presents with burning pain in the back, shoulders, and arms, along with intermittent freezing and hot sensations since late October, following physical strain. Imaging reveals disc narrowing and stenosis in the cervical and lumbar spine, with no significant improvement from current treatment. We discussed the necessity of an MRI to evaluate stenosis and potential nerve impingement, emphasizing that an MRI is crucial for detailed assessment and surgical planning, though she currently prefers non-surgical options. We will order MRI of the cervical and lumbar spine.  Rib Pain   She reports rib pain exacerbated by breathing and eating, with an X-ray ruling out fracture. The pain is sharp and associated with shortness of breath. We discussed potential causes, including musculoskeletal strain and GERD, and agreed that further imaging may be necessary if symptoms persist. We will evaluate the potential causes of rib pain and consider additional imaging if symptoms do not improve.  Gastroesophageal Reflux Disease (GERD)   She experiences burping and pain around the rib area, possibly related to GERD, and is currently on famotidine, pantoprazole, and Carafate. We discussed the importance of continuing these medications and monitoring for improvement. An  abdominal ultrasound and blood work are necessary to rule out other causes of abdominal pain. We will continue current GERD medications, order an abdominal ultrasound, check blood work including liver and kidney function, and coordinate with Dr. Rosina Lowenstein for a follow-up.  Chronic Sinusitis   She reports sinus issues and ear fullness, contributing to overall discomfort. We discussed the potential referral to ENT if symptoms persist. We will evaluate sinus and ear symptoms and consider a referral to ENT if symptoms do not improve.  General Health Maintenance   We are pending a comprehensive review of liver and kidney function. We will order comprehensive blood work including liver and kidney function tests.  Follow-up   We will schedule an abdominal ultrasound and MRI of the cervical and lumbar spine. A message will be sent to Dr. Rosina Lowenstein to expedite the follow-up appointment. She is advised to return to the emergency room if pain intensifies.        No follow-ups on file.    Donato Schultz, DO

## 2023-07-04 ENCOUNTER — Ambulatory Visit (HOSPITAL_BASED_OUTPATIENT_CLINIC_OR_DEPARTMENT_OTHER)
Admission: RE | Admit: 2023-07-04 | Discharge: 2023-07-04 | Disposition: A | Discharge status: Home / Self Care | Payer: Medicare HMO | Source: Ambulatory Visit | Attending: Family Medicine | Admitting: Family Medicine

## 2023-07-04 DIAGNOSIS — K7689 Other specified diseases of liver: Secondary | ICD-10-CM | POA: Diagnosis not present

## 2023-07-04 DIAGNOSIS — R1012 Left upper quadrant pain: Secondary | ICD-10-CM | POA: Insufficient documentation

## 2023-07-04 DIAGNOSIS — Z9049 Acquired absence of other specified parts of digestive tract: Secondary | ICD-10-CM | POA: Diagnosis not present

## 2023-07-04 LAB — COMPREHENSIVE METABOLIC PANEL
ALT: 13 U/L (ref 0–35)
AST: 16 U/L (ref 0–37)
Albumin: 4 g/dL (ref 3.5–5.2)
Alkaline Phosphatase: 57 U/L (ref 39–117)
BUN: 13 mg/dL (ref 6–23)
CO2: 34 meq/L — ABNORMAL HIGH (ref 19–32)
Calcium: 9.2 mg/dL (ref 8.4–10.5)
Chloride: 100 meq/L (ref 96–112)
Creatinine, Ser: 0.87 mg/dL (ref 0.40–1.20)
GFR: 65.33 mL/min (ref >60.00)
Glucose, Bld: 193 mg/dL — ABNORMAL HIGH (ref 70–99)
Potassium: 3.6 meq/L (ref 3.5–5.1)
Sodium: 140 meq/L (ref 135–145)
Total Bilirubin: 0.3 mg/dL (ref 0.2–1.2)
Total Protein: 6.2 g/dL (ref 6.0–8.3)

## 2023-07-04 LAB — CBC WITH DIFFERENTIAL/PLATELET
Basophils Absolute: 0.1 10*3/uL (ref 0.0–0.1)
Basophils Relative: 0.9 % (ref 0.0–3.0)
Eosinophils Absolute: 0.1 10*3/uL (ref 0.0–0.7)
Eosinophils Relative: 1.4 % (ref 0.0–5.0)
HCT: 40.5 % (ref 36.0–46.0)
Hemoglobin: 13.4 g/dL (ref 12.0–15.0)
Lymphocytes Relative: 23.5 % (ref 12.0–46.0)
Lymphs Abs: 1.8 10*3/uL (ref 0.7–4.0)
MCHC: 33.2 g/dL (ref 30.0–36.0)
MCV: 94.3 fL (ref 78.0–100.0)
Monocytes Absolute: 0.5 10*3/uL (ref 0.1–1.0)
Monocytes Relative: 5.7 % (ref 3.0–12.0)
Neutro Abs: 5.4 10*3/uL (ref 1.4–7.7)
Neutrophils Relative %: 68.5 % (ref 43.0–77.0)
Platelets: 251 10*3/uL (ref 150.0–400.0)
RBC: 4.29 Mil/uL (ref 3.87–5.11)
RDW: 13.5 % (ref 11.5–15.5)
WBC: 7.9 10*3/uL (ref 4.0–10.5)

## 2023-07-04 LAB — AMYLASE: Amylase: 21 U/L — ABNORMAL LOW (ref 27–131)

## 2023-07-04 LAB — LIPASE: Lipase: 28 U/L (ref 11.0–59.0)

## 2023-07-05 ENCOUNTER — Ambulatory Visit (INDEPENDENT_AMBULATORY_CARE_PROVIDER_SITE_OTHER): Payer: Medicare HMO

## 2023-07-05 ENCOUNTER — Other Ambulatory Visit: Payer: Self-pay | Admitting: *Deleted

## 2023-07-05 DIAGNOSIS — R739 Hyperglycemia, unspecified: Secondary | ICD-10-CM

## 2023-07-06 LAB — HEMOGLOBIN A1C: Hgb A1c MFr Bld: 6.2 % (ref 4.6–6.5)

## 2023-07-07 ENCOUNTER — Ambulatory Visit
Admission: RE | Admit: 2023-07-07 | Discharge: 2023-07-07 | Disposition: A | Discharge status: Home / Self Care | Payer: Medicare HMO | Source: Ambulatory Visit | Attending: Family Medicine | Admitting: Family Medicine

## 2023-07-07 DIAGNOSIS — M47812 Spondylosis without myelopathy or radiculopathy, cervical region: Secondary | ICD-10-CM | POA: Diagnosis not present

## 2023-07-07 DIAGNOSIS — M5416 Radiculopathy, lumbar region: Secondary | ICD-10-CM

## 2023-07-07 DIAGNOSIS — M47816 Spondylosis without myelopathy or radiculopathy, lumbar region: Secondary | ICD-10-CM | POA: Diagnosis not present

## 2023-07-07 DIAGNOSIS — M792 Neuralgia and neuritis, unspecified: Secondary | ICD-10-CM

## 2023-07-30 ENCOUNTER — Other Ambulatory Visit: Payer: Self-pay | Admitting: Family Medicine

## 2023-07-30 DIAGNOSIS — I10 Essential (primary) hypertension: Secondary | ICD-10-CM

## 2023-08-06 ENCOUNTER — Other Ambulatory Visit: Payer: Self-pay | Admitting: Family Medicine

## 2023-08-06 DIAGNOSIS — E785 Hyperlipidemia, unspecified: Secondary | ICD-10-CM

## 2023-08-15 ENCOUNTER — Other Ambulatory Visit: Payer: Self-pay | Admitting: Family Medicine

## 2023-08-15 ENCOUNTER — Other Ambulatory Visit: Payer: Self-pay | Admitting: Cardiovascular Disease

## 2023-08-24 DIAGNOSIS — M48061 Spinal stenosis, lumbar region without neurogenic claudication: Secondary | ICD-10-CM | POA: Diagnosis not present

## 2023-08-24 DIAGNOSIS — M542 Cervicalgia: Secondary | ICD-10-CM | POA: Diagnosis not present

## 2023-08-24 DIAGNOSIS — G8929 Other chronic pain: Secondary | ICD-10-CM | POA: Diagnosis not present

## 2023-09-03 ENCOUNTER — Ambulatory Visit (INDEPENDENT_AMBULATORY_CARE_PROVIDER_SITE_OTHER): Payer: Medicare HMO | Admitting: Family Medicine

## 2023-09-03 ENCOUNTER — Encounter: Payer: Self-pay | Admitting: Family Medicine

## 2023-09-03 VITALS — BP 124/72 | HR 74 | Ht 60.6 in | Wt 85.2 lb

## 2023-09-03 DIAGNOSIS — I10 Essential (primary) hypertension: Secondary | ICD-10-CM | POA: Diagnosis not present

## 2023-09-03 DIAGNOSIS — E559 Vitamin D deficiency, unspecified: Secondary | ICD-10-CM | POA: Diagnosis not present

## 2023-09-03 DIAGNOSIS — F17200 Nicotine dependence, unspecified, uncomplicated: Secondary | ICD-10-CM

## 2023-09-03 DIAGNOSIS — R634 Abnormal weight loss: Secondary | ICD-10-CM

## 2023-09-03 DIAGNOSIS — E785 Hyperlipidemia, unspecified: Secondary | ICD-10-CM | POA: Diagnosis not present

## 2023-09-03 LAB — COMPREHENSIVE METABOLIC PANEL
ALT: 9 U/L (ref 0–35)
AST: 14 U/L (ref 0–37)
Albumin: 4.2 g/dL (ref 3.5–5.2)
Alkaline Phosphatase: 60 U/L (ref 39–117)
BUN: 14 mg/dL (ref 6–23)
CO2: 31 meq/L (ref 19–32)
Calcium: 9 mg/dL (ref 8.4–10.5)
Chloride: 101 meq/L (ref 96–112)
Creatinine, Ser: 0.75 mg/dL (ref 0.40–1.20)
GFR: 77.97 mL/min (ref >60.00)
Glucose, Bld: 102 mg/dL — ABNORMAL HIGH (ref 70–99)
Potassium: 4.5 meq/L (ref 3.5–5.1)
Sodium: 139 meq/L (ref 135–145)
Total Bilirubin: 0.4 mg/dL (ref 0.2–1.2)
Total Protein: 6.3 g/dL (ref 6.0–8.3)

## 2023-09-03 LAB — CBC WITH DIFFERENTIAL/PLATELET
Basophils Absolute: 0.1 10*3/uL (ref 0.0–0.1)
Basophils Relative: 1.3 % (ref 0.0–3.0)
Eosinophils Absolute: 0.2 10*3/uL (ref 0.0–0.7)
Eosinophils Relative: 4.2 % (ref 0.0–5.0)
HCT: 42.7 % (ref 36.0–46.0)
Hemoglobin: 13.9 g/dL (ref 12.0–15.0)
Lymphocytes Relative: 28.3 % (ref 12.0–46.0)
Lymphs Abs: 1.5 10*3/uL (ref 0.7–4.0)
MCHC: 32.4 g/dL (ref 30.0–36.0)
MCV: 94.7 fL (ref 78.0–100.0)
Monocytes Absolute: 0.4 10*3/uL (ref 0.1–1.0)
Monocytes Relative: 7.1 % (ref 3.0–12.0)
Neutro Abs: 3.2 10*3/uL (ref 1.4–7.7)
Neutrophils Relative %: 59.1 % (ref 43.0–77.0)
Platelets: 200 10*3/uL (ref 150.0–400.0)
RBC: 4.51 Mil/uL (ref 3.87–5.11)
RDW: 13.8 % (ref 11.5–15.5)
WBC: 5.3 10*3/uL (ref 4.0–10.5)

## 2023-09-03 LAB — LIPID PANEL
Cholesterol: 142 mg/dL (ref 0–200)
HDL: 46.9 mg/dL (ref >39.00)
LDL Cholesterol: 75 mg/dL (ref 0–99)
NonHDL: 95.59
Total CHOL/HDL Ratio: 3
Triglycerides: 105 mg/dL (ref 0.0–149.0)
VLDL: 21 mg/dL (ref 0.0–40.0)

## 2023-09-03 LAB — VITAMIN D 25 HYDROXY (VIT D DEFICIENCY, FRACTURES): VITD: 13.38 ng/mL — ABNORMAL LOW (ref 30.00–100.00)

## 2023-09-03 LAB — TSH: TSH: 1.44 u[IU]/mL (ref 0.35–5.50)

## 2023-09-03 MED ORDER — VITAMIN D (ERGOCALCIFEROL) 1.25 MG (50000 UNIT) PO CAPS: 50000.0000 [IU] | ORAL_CAPSULE | ORAL | 1 refills | Status: DC

## 2023-09-03 MED ORDER — METHOCARBAMOL 500 MG PO TABS: 500.0000 mg | ORAL_TABLET | Freq: Three times a day (TID) | ORAL | 0 refills | Status: DC | PRN

## 2023-09-03 MED ORDER — MEGESTROL ACETATE 400 MG/10ML PO SUSP: 400.0000 mg | Freq: Every day | ORAL | 0 refills | Status: DC

## 2023-09-03 NOTE — Progress Notes (Signed)
Established Patient Office Visit  Subjective   Patient ID: Leah [name], female    DOB: 05/23/1946  Age: 78 y.o. MRN: [id number]  Chief Complaint  Patient presents with   Follow-up    Pt states she believes she pulled a muscle in the back of her L leg from ankle to hip  Pt hit leg against back of wall  Pt would also like something to "help her gain weight" pt states she has lost 35lbs     HPI Discussed the use of AI scribe software for clinical note transcription with the patient, who gave verbal consent to proceed.  History of Present Illness   The patient presents with leg pain and unintentional weight loss.  The leg pain began after stepping down from a two-step ladder last Thursday. The pain originates from the back of the leg, extending from the knee to the hip, and is exacerbated by walking. She did not fall but missed a step and hit her back against the wall. The pain is not present when touched, only when walking. She has been taking Tylenol for the pain but has not found it effective.  She is concerned about unintentional weight loss, having lost approximately 30 pounds over the past seven years, with a notable decrease from 98 pounds a year ago to 85 pounds currently. She eats until full but experiences nausea if she overeats. She consumes Ensure but has not seen weight gain. She feels cold frequently, which she attributes to her weight loss. She has been on Zoloft and Ativan for years without previous weight loss issues. She does not feel hungry upon waking and typically eats only two meals a day.  In October, she experienced pain behind her rib, which was attributed to a pulled muscle. The pain persists when turning a certain way. She has had various imaging studies, including a chest CT in 19-Sep-2019 and a chest x-ray, which showed no significant findings except for the pulled muscle.  She mentions not having had a lung cancer screening since 19-Sep-2019 and was unaware of the need  for annual screenings. She has a history of taking methocarbamol for muscle pain, which she received in December, possibly from an ER visit.      Patient Active Problem List   Diagnosis Date Noted   Vitamin D deficiency 09/03/2023   Weight loss, abnormal 03/24/2022   Acute cough 06/28/2021   Acute pain of right shoulder 09/07/2020   Cold intolerance 09/07/2020   SBO (small bowel obstruction) (HCC) 09/19/2019   Current every day smoker 09/19/2019   Diverticulitis 09/19/2019   Partial small bowel obstruction (HCC) 09/04/2019   Preventative health care 08/27/2019   Family history of coronary artery disease 02/17/2019   Claudication in peripheral vascular disease (HCC) 10/14/2018   Dyspnea 09/03/2018   Calcification of native coronary artery 09/03/2018   PAD (peripheral artery disease) (HCC) 09/03/2018   History of esophageal stricture 06/19/2018   Rhinitis, chronic 04/23/2017   Small bowel obstruction due to adhesions (HCC) 04/08/2016   Frozen shoulder 04/27/2015   Hematoma 09/07/2014   Cervical spondylosis with myelopathy 08/18/2014   Cervical spondylosis with radiculopathy 08/18/2014   Neck pain 05/19/2014   Fatigue 05/08/2014   Back pain 05/08/2014   Pain in joint, upper arm 05/08/2014   LLQ abdominal pain 03/02/2014   History of diverticulitis 03/02/2014   COPD exacerbation (HCC) 01/28/2014   Oral thrush 01/28/2014   Influenza 10/27/2013   Appendicitis 09/19/2013   Abdominal pain, left  lower quadrant 03/05/2013   Essential hypertension 02/07/2013   Sinusitis, acute 01/15/2013   COPD GOLD II 01/15/2013   Smoker 12/20/2012   Epigastric pain 09/03/2012   Left lower quadrant pain 01/24/2011   Leg pain, left 06/07/2009   ROSACEA 04/16/2009   B12 DEFICIENCY 04/09/2009   ANEMIA, IRON DEFICIENCY 03/19/2009   MALAISE AND FATIGUE 03/19/2009   NECK PAIN, LEFT 01/11/2009   URI 07/30/2008   Constipation 05/26/2008   NAUSEA AND VOMITING 05/26/2008   Abdominal pain 05/26/2008    Claudication (HCC) 04/27/2008   VOMITING 04/17/2008   Dysphagia 04/17/2008   ANXIETY DEPRESSION 03/12/2008   TREMOR 03/12/2008   Hyperlipidemia 11/13/2007   DEPRESSION 11/13/2007   ESOPHAGEAL SPASM 11/13/2007   GERD (gastroesophageal reflux disease) 11/13/2007   IBS 11/13/2007   CARPAL TUNNEL SYNDROME, HX OF 11/13/2007   Diverticulosis of colon (without mention of hemorrhage) 03/14/2007   GASTRITIS, CHRONIC 04/10/2002   HIATAL HERNIA 04/10/2002   Past Medical History:  Diagnosis Date   Allergy    Anemia    Anxiety    Barrett esophagus    Bladder pain    Cataract    Chronic bronchitis (HCC)    per pt last episode july 2016   Complication of anesthesia    slow to wake   COPD (chronic obstructive pulmonary disease) (HCC)    Depression    Diverticulosis    Exocrine pancreatic insufficiency    Frequency of urination    GERD (gastroesophageal reflux disease)    Hiatal hernia    History of adenomatous polyp of colon    tubular adenoma   History of chronic gastritis    History of diverticulitis of colon    History of esophageal dilatation    for stricture   History of esophageal spasm    Hyperlipidemia    Hypertension    IBS (irritable bowel syndrome)    Interstitial cystitis    PAD (peripheral artery disease) (HCC)    Productive cough    intermittant   Smokers' cough (HCC)    Urgency of urination    Past Surgical History:  Procedure Laterality Date   ABDOMINAL AORTOGRAM N/A 10/14/2018   Procedure: ABDOMINAL AORTOGRAM;  Surgeon: Runell Gess, MD;  Location: [facility];  Service: Cardiovascular;  Laterality: N/A;   ABDOMINAL HYSTERECTOMY  1975   ANTERIOR CERVICAL DECOMP/DISCECTOMY FUSION N/A 09/07/2014   Procedure: Exploration of Fusion and Removal of Anterior Cervical Hematoma;  Surgeon: Tressie Stalker, MD;  Location: [facility]UR[facility];  Service: Neurosurgery;  Laterality: N/A;   CARPAL TUNNEL RELEASE Right 1993   COLONOSCOPY  last one 04-30-2013    COLONOSCOPY WITH PROPOFOL N/A 09/09/2019   Procedure: COLONOSCOPY WITH PROPOFOL;  Surgeon: Sherrilyn Rist, MD;  Location: [facility];  Service: Gastroenterology;  Laterality: N/A;   CYSTO WITH HYDRODISTENSION N/A 06/08/2015   Procedure: CYSTOSCOPY/HYDRODISTENSION INSTILLATION OF MARCAINE AND PYRIDIUM;  Surgeon: Bjorn Pippin, MD;  Location: [facility];  Service: Urology;  Laterality: N/A;   CYSTO/  HOD/  BLADDER BX  1990's   ESOPHAGOGASTRODUODENOSCOPY  last one 09-11-2012   LAPAROSCOPIC APPENDECTOMY N/A 09/21/2013   Procedure: APPENDECTOMY LAPAROSCOPIC;  Surgeon: Emelia Loron, MD;  Location: [facility] OR;  Service: General;  Laterality: N/A;   LAPAROSCOPIC CHOLECYSTECTOMY  2002   LAPAROSCOPY SIGMOID COLECTOMY  10-05-2008   diverticulitis   LOWER EXTREMITY ANGIOGRAPHY Left 10/14/2018   LOWER EXTREMITY ANGIOGRAPHY Bilateral 10/14/2018   Procedure: Lower Extremity Angiography;  Surgeon: Runell Gess, MD;  Location: [facility];  Service: Cardiovascular;  Laterality: Bilateral;  ILIACS   MICROLARYNGOSCOPY  09-27-2006   w/ true vocal cord stripping  and bilateral bx's of lesion's (benign)   PERIPHERAL VASCULAR INTERVENTION  10/14/2018   Procedure: PERIPHERAL VASCULAR INTERVENTION;  Surgeon: Runell Gess, MD;  Location: [facility];  Service: Cardiovascular;;   SINUS SURGERY WITH INSTATRAK  1991   TEMPOROMANDIBULAR JOINT SURGERY  1990   TONSILLECTOMY AND ADENOIDECTOMY  1964   Social History   Tobacco Use   Smoking status: Every Day    Current packs/day: 0.50    Average packs/day: 0.7 packs/day for 48.1 years (35.8 ttl pk-yrs)    Types: Cigarettes    Start date: 2024   Smokeless tobacco: Never   Tobacco comments:    counseled about smoking cessation  Vaping Use   Vaping status: Never Used  Substance Use Topics   Alcohol use: No    Alcohol/week: 0.0 standard drinks of alcohol   Drug use: No   Social History   Socioeconomic History   Marital  status: Married    Spouse name: Not on file   Number of children: 3   Years of education: Not on file   Highest education level: Not on file  Occupational History   Not on file  Tobacco Use   Smoking status: Every Day    Current packs/day: 0.50    Average packs/day: 0.7 packs/day for 48.1 years (35.8 ttl pk-yrs)    Types: Cigarettes    Start date: 2024   Smokeless tobacco: Never   Tobacco comments:    counseled about smoking cessation  Vaping Use   Vaping status: Never Used  Substance and Sexual Activity   Alcohol use: No    Alcohol/week: 0.0 standard drinks of alcohol   Drug use: No   Sexual activity: Not Currently    Birth control/protection: Post-menopausal  Other Topics Concern   Not on file  Social History Narrative   Not on file   Social Drivers of Health   Financial Resource Strain: Not on file  Food Insecurity: Not on file  Transportation Needs: Not on file  Physical Activity: Not on file  Stress: Not on file  Social Connections: Not on file  Intimate Partner Violence: Not on file   Family Status  Relation Name Status   Mother  Deceased   Father  Deceased   Sister  Alive   Brother  Alive   MGM  Deceased   MGF  Deceased   PGM  Deceased   PGF  Deceased   Daughter  Alive   Daughter  Alive   Son  Alive   Neg Hx  (Not Specified)  No partnership data on file   Family History  Problem Relation Age of Onset   Pancreatic cancer Mother    Colon polyps Mother    Pancreatic cancer Father    Diabetes Father    Heart disease Father    Leukemia Father    Prostate cancer Father    Hypertension Father    Heart disease Brother    Heart attack Brother    Other Daughter        prediabetes   Heart disease Daughter    Diabetes Daughter    Colon cancer Neg Hx    Esophageal cancer Neg Hx    Rectal cancer Neg Hx    Stomach cancer Neg Hx    Allergies  Allergen Reactions   Amoxicillin-Pot Clavulanate Shortness Of  Breath, Itching and Swelling   Avelox  [Moxifloxacin Hcl In Nacl] Swelling    Swelling, itching and shortness of breath   Ciprofloxacin Shortness Of Breath, Itching and Swelling   Keflex [Cephalexin] Shortness Of Breath and Swelling   Penicillins Shortness Of Breath, Itching and Swelling    Did it involve swelling of the face/tongue/throat, SOB, or low BP? Yes Did it involve sudden or severe rash/hives, skin peeling, or any reaction on the inside of your mouth or nose? No Did you need to seek medical attention at a hospital or doctor's office? Yes When did it last happen?      Several years If all above answers are "NO", may proceed with cephalosporin use.    Sulfa Antibiotics Shortness Of Breath, Itching and Swelling   Prednisone Other (See Comments)    GI irritation; is able to tolerate injections   Acyclovir And Related Other (See Comments)    Pt does not recall this reaction   Clindamycin/Lincomycin Itching and Swelling   Levaquin [Levofloxacin] Hives   Zyvox [Linezolid] Diarrhea   Hydrocodone Rash      Review of Systems  Constitutional:  Positive for weight loss. Negative for fever and malaise/fatigue.  HENT:  Negative for congestion.   Eyes:  Negative for blurred vision.  Respiratory:  Negative for shortness of breath.   Cardiovascular:  Negative for chest pain, palpitations and leg swelling.  Gastrointestinal:  Negative for abdominal pain, blood in stool and nausea.  Genitourinary:  Negative for dysuria and frequency.  Musculoskeletal:  Negative for falls.  Skin:  Negative for rash.  Neurological:  Negative for dizziness, loss of consciousness and headaches.  Endo/Heme/Allergies:  Negative for environmental allergies.  Psychiatric/Behavioral:  Negative for depression. The patient is not nervous/anxious.       Objective:     BP 124/72 (BP Location: Left Arm, Patient Position: Sitting, Cuff Size: Normal)   Pulse 74   Ht 5' 0.6" (1.539 m)   Wt 85 lb 3.2 oz (38.6 kg)   SpO2 94%   BMI 16.31 kg/m  BP  Readings from Last 3 Encounters:  09/03/23 124/72  07/03/23 110/60  07/02/23 (!) 152/78   Wt Readings from Last 3 Encounters:  09/03/23 85 lb 3.2 oz (38.6 kg)  07/03/23 87 lb 9.6 oz (39.7 kg)  07/02/23 88 lb (39.9 kg)   SpO2 Readings from Last 3 Encounters:  09/03/23 94%  07/03/23 94%  07/02/23 91%      Physical Exam Vitals and nursing note reviewed.  Constitutional:      General: She is not in acute distress.    Appearance: Normal appearance. She is well-developed. She is cachectic. She is not toxic-appearing or diaphoretic.  HENT:     Head: Normocephalic and atraumatic.  Eyes:     General: No scleral icterus.       Right eye: No discharge.        Left eye: No discharge.  Cardiovascular:     Rate and Rhythm: Normal rate and regular rhythm.     Heart sounds: No murmur heard. Pulmonary:     Effort: Pulmonary effort is normal. No respiratory distress.     Breath sounds: Normal breath sounds.  Musculoskeletal:        General: Normal range of motion.     Cervical back: Normal range of motion and neck supple.     Right lower leg: No edema.     Left lower leg: No edema.  Skin:    General: Skin  is warm and dry.  Neurological:     Mental Status: She is alert and oriented to person, place, and time.  Psychiatric:        Mood and Affect: Mood normal.        Behavior: Behavior normal.        Thought Content: Thought content normal.        Judgment: Judgment normal.      No results found for any visits on 09/03/23.  Last CBC Lab Results  Component Value Date   WBC 7.9 07/03/2023   HGB 13.4 07/03/2023   HCT 40.5 07/03/2023   MCV 94.3 07/03/2023   MCH 30.6 07/02/2023   RDW 13.5 07/03/2023   PLT 251.0 07/03/2023   Last metabolic panel Lab Results  Component Value Date   GLUCOSE 193 (H) 07/03/2023   NA 140 07/03/2023   K 3.6 07/03/2023   CL 100 07/03/2023   CO2 34 (H) 07/03/2023   BUN 13 07/03/2023   CREATININE 0.87 07/03/2023   GFR 65.33 07/03/2023    CALCIUM 9.2 07/03/2023   PROT 6.2 07/03/2023   ALBUMIN 4.0 07/03/2023   BILITOT 0.3 07/03/2023   ALKPHOS 57 07/03/2023   AST 16 07/03/2023   ALT 13 07/03/2023   ANIONGAP 5 09/08/2019   Last lipids Lab Results  Component Value Date   CHOL 133 03/06/2023   HDL 58.10 03/06/2023   LDLCALC 58 03/06/2023   LDLDIRECT 91.0 01/10/2022   TRIG 83.0 03/06/2023   CHOLHDL 2 03/06/2023   Last hemoglobin A1c Lab Results  Component Value Date   HGBA1C 6.2 07/05/2023   Last thyroid functions Lab Results  Component Value Date   TSH 1.43 03/06/2023   Last vitamin D Lab Results  Component Value Date   VD25OH 21.02 (L) 03/06/2023   Last vitamin B12 and Folate Lab Results  Component Value Date   VITAMINB12 348 03/06/2023   FOLATE 10.9 03/19/2009      The 10-year ASCVD risk score (Arnett DK, et al., 2019) is: 26.9%    Assessment & Plan:   Problem List Items Addressed This Visit       Unprioritized   Smoker - Primary (Chronic)   Relevant Orders   Ambulatory Referral Lung Cancer Screening [facility]   Weight loss, abnormal   Relevant Medications   megestrol (MEGACE) 400 MG/10ML suspension   Other Relevant Orders   Amb ref to Medical Nutrition Therapy-MNT   Vitamin D deficiency   Relevant Orders   VITAMIN D 25 Hydroxy (Vit-D Deficiency, Fractures)   Hyperlipidemia   Encourage heart healthy diet such as MIND or DASH diet, increase exercise, avoid trans fats, simple carbohydrates and processed foods, consider a krill or fish or flaxseed oil cap daily.        Relevant Orders   CBC with Differential/Platelet   Comprehensive metabolic panel   Lipid panel   TSH   Essential hypertension   Well controlled, no changes to meds. Encouraged heart healthy diet such as the DASH diet and exercise as tolerated.        Relevant Orders   CBC with Differential/Platelet   Comprehensive metabolic panel   Lipid panel   TSH  Assessment and Plan    Unintentional Weight  Loss There is a significant unintentional weight loss of approximately 30 pounds over seven years, with a notable decrease from 98 pounds to 85 pounds in the past year. Reports of early satiety and cold intolerance are noted. Currently stable on Zoloft and  Ativan for years, and using Ensure for supplemental nutrition. Discussed potential use of appetite stimulants and the need for medication adjustments. Referred to a nutritionist for further evaluation and management. Emphasized the importance of three meals a day and using Ensure or Boost between meals. Prescribe an appetite stimulant, refer to a nutritionist, and advise three meals a day with Ensure or Boost between meals. Discuss medication adjustments with Dr. Joette Catching and Dr. Caren Macadam. Schedule a follow-up in one month for a weight check.  Muscle Strain A muscle strain in the posterior leg and hip region occurred following a minor fall from a step stool. Pain is present when walking but not on palpation. Tylenol is being used for pain management without significant relief. Discussed the use of methocarbamol and potential benefits of heat therapy. Consider referral to sports medicine if no improvement. Refill methocarbamol and advise the use of heat therapy. Refer to sports medicine if no improvement.  General Health Maintenance Lung cancer screening is overdue. The last chest CT was in 2021, and it was due in 2022. Discussed the importance of annual lung cancer screenings and the need to complete the overdue chest CT. Order a chest CT for lung cancer screening.  Follow-up Follow up with Dr. Joette Catching in March and with Dr. Adela Lank next week or the week after. Inform the clinic if there is no contact from the nutritionist within 10 days.        Return in about 4 weeks (around 10/01/2023), or if symptoms worsen or fail to improve.    Donato Schultz, DO

## 2023-09-03 NOTE — Assessment & Plan Note (Signed)
 Encourage heart healthy diet such as MIND or DASH diet, increase exercise, avoid trans fats, simple carbohydrates and processed foods, consider a krill or fish or flaxseed oil cap daily.

## 2023-09-03 NOTE — Assessment & Plan Note (Signed)
 Well controlled, no changes to meds. Encouraged heart healthy diet such as the DASH diet and exercise as tolerated.

## 2023-09-03 NOTE — Patient Instructions (Signed)
 High-Protein and High-Calorie Diet Eating high-protein and high-calorie foods can help you to gain weight, heal after an injury, and get better after an illness or surgery. The amount of daily protein and calories you need depends on: Your body weight. The reason you were told to follow this diet. Usually, a high-protein, high-calorie diet means that you should: Eat 250-500 extra calories each day. Make sure that you get enough of your daily calories from protein. Ask your health care provider how many of your calories should come from protein and how many calories total you need each day. Follow the diet as told by your provider. What are tips for following this plan? Reading food labels Check the nutrition facts label for calories, and grams of fat and protein. Items with more than 4 grams of protein are high-protein foods. General information  Ask your provider if you should take a nutritional supplement. Try to eat six small meals each day instead of three large meals. A goal is usually to eat every 2 to 3 hours. Eat a balanced diet. In each meal, include one food that's high in protein and one food with fat in it. Keep nutritious snacks available, such as nuts, trail mixes, dried fruit, and whole-milk yogurt. If you have kidney disease or diabetes, talk with your provider about how much protein is safe for you. Too much protein may put extra stress on your kidneys. Replace zero-calorie drinks with drinks that have calories in them, such as milk and 100% fruit juice. Consider setting a timer to remind you to eat. You'll want to eat even if you do not feel very hungry. Preparing meals Milk and dairy foods. Add whole milk, half-and-half, or heavy cream to cereal, pudding, soup, or hot cocoa. Add whole milk to instant breakfast drinks. Add powdered milk to baked goods, smoothies, or milkshakes. Add powdered milk, cream, or butter to mashed potatoes. Replace water with milk or heavy cream  when making foods such as oatmeal, pudding, or cocoa. Make cream-based pastas and soups. Add cheese to cooked vegetables. Make whole-milk yogurt parfaits. Top them with granola, fruit, or nuts. Add cottage cheese to fruit. Add cream cheese to sandwiches or as a topping on crackers and bread. Eggs. Add hard-boiled eggs to salads. Keep hard-boiled eggs in the fridge to snack on. Add cheese to cooked eggs. Beans, nuts, and seeds. Add peanut butter to oatmeal or smoothies. Use peanut butter as a dip for fruits and vegetables or as a topping for pretzels, celery, or crackers. Add beans to casseroles, dips, and spreads. Add pureed beans to sauces and soups. Salads, soups, and other foods. Add avocado, cheese, or both to sandwiches or salads. Add avocado to smoothies. Add meat, poultry, or seafood to rice, pasta, casseroles, salads, and soups. Use mayonnaise when making egg salad, chicken salad, or tuna salad. Add oil or butter to cooked vegetables and grains. What high-protein foods should I eat?  Vegetables Soybeans. Peas. Grains Quinoa. Bulgur wheat. Buckwheat. Meats and other proteins Beef, pork, and poultry. Fish and seafood. Eggs. Tofu. Textured vegetable protein (TVP). Peanut butter. Nuts and seeds. Dried beans. Protein powders. Hummus. Jerky. Dairy Whole milk. Whole-milk yogurt. Powdered milk. Cheese. Cottage cheese. Eggnog. Beverages High-protein supplement drinks. Soy milk. Other foods Protein bars. The items listed above may not be all the foods and drinks you can have. Talk with an expert in healthy eating called a dietitian to learn more. What high-calorie foods should I eat? Fruits Dried fruit. Fruit leather. Canned  fruit in syrup. Fruit juice. Avocado. Vegetables Vegetables cooked in oil or butter. Fried potatoes. Grains Pasta. Quick breads. Muffins. Pancakes. Granola. Meats and other proteins Peanut butter and other nut butters. Nuts and seeds. Dairy Heavy cream.  Whipped cream. Cream cheese. Sour cream. Ice cream. Custard. Pudding. Whole-milk dairy products. Beverages Meal-replacement beverages. Nutrition shakes. Fruit juice. Seasonings and condiments Salad dressing. Mayonnaise. Alfredo sauce. Fruit preserves or jelly. Honey. Syrup. Sweets and desserts Cake. Cookies. Pie. Pastries. Candy bars. Chocolate. Fats and oils Butter or margarine. Oil. Gravy. Other foods Meal-replacement bars. The items listed above may not be all the foods and drinks you can have. Talk with an expert in healthy eating to learn more. This information is not intended to replace advice given to you by your health care provider. Make sure you discuss any questions you have with your health care provider. Document Revised: 12/11/2022 Document Reviewed: 12/11/2022 Elsevier Patient Education  2024 [employer].

## 2023-09-05 ENCOUNTER — Encounter: Payer: Self-pay | Admitting: Nurse Practitioner

## 2023-09-17 ENCOUNTER — Ambulatory Visit: Payer: Medicare HMO | Admitting: Gastroenterology

## 2023-09-17 ENCOUNTER — Encounter: Payer: Self-pay | Admitting: Gastroenterology

## 2023-09-17 ENCOUNTER — Other Ambulatory Visit: Payer: Medicare HMO

## 2023-09-17 VITALS — BP 122/70 | HR 53 | Ht 60.6 in | Wt 85.0 lb

## 2023-09-17 DIAGNOSIS — F17200 Nicotine dependence, unspecified, uncomplicated: Secondary | ICD-10-CM | POA: Diagnosis not present

## 2023-09-17 DIAGNOSIS — F17210 Nicotine dependence, cigarettes, uncomplicated: Secondary | ICD-10-CM | POA: Diagnosis not present

## 2023-09-17 DIAGNOSIS — R636 Underweight: Secondary | ICD-10-CM

## 2023-09-17 DIAGNOSIS — R634 Abnormal weight loss: Secondary | ICD-10-CM

## 2023-09-17 DIAGNOSIS — E7849 Other hyperlipidemia: Secondary | ICD-10-CM

## 2023-09-17 NOTE — Patient Instructions (Addendum)
Please go to the lab in the basement of our building to have lab work done as you leave today. Hit "B" for basement when you get on the elevator.  When the doors open the lab is on your left.  We will call you with the results. Thank you. ____________________________________________________________________________  Leah [name] will be contacted by [facility] Scheduling in the next 2 days to arrange a CT scan of your abdomen and Pelvis.  The number on your caller ID will be [phone], please answer when they call.  If you have not heard from them in 2 days please call [phone] to schedule.     You have been scheduled for a CT scan of the abdomen and pelvis at [facility], 1st floor Radiology. You are scheduled on ___________________ at ____________. You should arrive _____________ minutes prior to your appointment time for registration.    Do not eat (or drink) anything after _____________ (4 hours prior to your test)   Plan on being there 30 to 60 minutes, depending on the type of exam you are having performed.   If you have any questions regarding your exam or if you need to reschedule, you may call [facility] Radiology Scheduling at [phone] between the hours of 8:00 am and 5:00 pm, Monday-Friday.  _____________________________________________________________________________  Leah [name] to [facility] and get the Weight Gainer Protein Shake.   Thank you for entrusting me with your care and for choosing [facility], Dr. Ileene Patrick    If your blood pressure at your visit was 140/90 or greater, please contact your primary care physician to follow up on this. ______________________________________________________  If you are age 48 or older, your body mass index should be between 23-30. Your Body mass index is 16.27 kg/m. If this is out of the aforementioned range listed, please consider follow up with your Primary Care Provider.  If you are age 23 or younger, your body  mass index should be between 19-25. Your Body mass index is 16.27 kg/m. If this is out of the aformentioned range listed, please consider follow up with your Primary Care Provider.  ________________________________________________________  The [facility] providers would like to encourage you to use [facility] to communicate with providers for non-urgent requests or questions.  Due to long hold times on the telephone, sending your provider a message by [facility] may be a faster and more efficient way to get a response.  Please allow 48 business hours for a response.  Please remember that this is for non-urgent requests.  _______________________________________________________  Due to recent changes in healthcare laws, you may see the results of your imaging and laboratory studies on MyChart before your provider has had a chance to review them.  We understand that in some cases there may be results that are confusing or concerning to you. Not all laboratory results come back in the same time frame and the provider may be waiting for multiple results in order to interpret others.  Please give [place] 48 hours in order for your provider to thoroughly review all the results before contacting the office for clarification of your results.

## 2023-09-17 NOTE — Progress Notes (Signed)
HPI :  78 year old female with a history of diverticulitis status post surgery, history of dysphagia, chronic underweight, here to establish care with me for some of these issues.  She was last seen by Dr. Myrtie Neither in August 2023.  She then switched her care to [facility] for another opinion.  She asked to switch back to our practice to see me as I cared for her husband previously.  This is my first time seeing her in the office.  Main reason she is here is for continued weight loss and being chronically underweight.  The last time she was here she weighed 100 pounds.  She is currently 85 pounds, lost about an additional 15 pounds over the past 2 years.  She states her goal weight is 120 pounds but that has been a long time ago.  I see that she has been seen for abdominal pain and constipation in the past.  She has had a previous sigmoid resection for diverticulitis.  She has a very tortuous colon with redundancy.  She has had 2 colonoscopies to screen her colon, both were limited by poor preps and incomplete visualization.  Recall she has had multiple pelvic surgeries and small bowel obstruction in the past due to adhesions.  She denies any abdominal pain recently that been bothering her.  She states she has been moving her bowels typically every other day.  She takes "digestive enzymes" over-the-counter, unclear what is in these.  She was evaluated by [facility] and found to have a low fecal pancreatic elastase at 140s.  Looks like she was started on pancreatic enzyme supplementation but I do not see that she has been taking that recently.  Again she denies any diarrhea.  She does not have any loose stool and denies seeing any fat or grease in the stool.  She states she eats well, 3 meals per day and can "eat what ever she wants".  She states she usually has cereal or oatmeal for breakfast, sandwich for lunch and then a full meal for dinner.  She was trying to supplement with Ensure at times.   She denies any nausea or vomiting.  She states her appetite is good and she does want to eat and eat without difficulty.  It sounds like she was started on Megace about 1 week ago.  Her husband passed of liver cancer this past fall.  That was very stressful for her and per review of chart it was thought this could be related to some of her poor appetite, with ongoing stress in her life.  She also smokes half pack of cigarettes per day.  She states she has been smoking since 78 years old.  She had a CT scan of her chest for PE protocol 1 in the ED in December which did not show any concerning mass lesions in the chest.  She had cross-sectional imaging of her abdomen last in June 2023.  She had a complete abdominal ultrasound in December as well which did not show any pathology to account for her weight loss.  She also had a gastric emptying study which was normal.  She has had EGDs for dilation of her esophagus due to benign stenosis in the past, last in 2022.  She denies any problems with her swallowing today or recently    EGD August 2022:: The distal esophagus was tortuous due to the hiatal hernia. - A 2 cm hiatal hernia was present. - One benign-appearing, intrinsic moderate stenosis was  found at the gastroesophageal junction. This stenosis measured 1.2 cm (inner diameter) x less than one cm (in length). The stenosis was traversed. A TTS dilator was passed through the scope. Dilation with a 16-17- 18 mm balloon dilator was performed to 18 mm. The dilation site was examined and showed mild mucosal disruption and mild improvement in luminal narrowing. - The stomach was normal. - The cardia and gastric fundus were normal on retroflexion. - The examined duodenum was normal.   It was felt the dysphagia was related to combination of tortuous distal esophagus, hiatal hernia and stricture which was balloon dilated.    Extensive stool testing [facility] - low pancreatic fecal elastase in the setting  of diarrhea - 141    Colonoscopy 09/09/19: - Preparation of the colon was poor. - Patent end-to-side colo-colonic anastomosis, characterized by healthy appearing mucosa. - Stool in the entire examined colon. - The examination was otherwise normal on direct and retroflexion views. - No specimens collected. No colitis was seen. The shape of the anastomosis accounts for the abnormal appearance on CT scan. The current episode appears to have been a PSBO (now resolved), most likely due to adhesions from multiple prior surgeries and protracted post-operative infectious course after sigmoid resection.   Colonoscopy 05/31/2018: - The digital rectal exam findings include decreased sphincter tone. - A large amount of solid and liquid stool was found in the entire colon. - There was evidence of a prior end-to-side colo-colonic anastomosis in the recto-sigmoid colon. This was patent but tortuous, and was characterized by healthy appearing mucosa, though visualization limited by poor prep. - Retroflexion in the rectum was not performed due to anatomy. - The exam was otherwise without abnormality.   CT abdomen / pelvis 01/12/22: IMPRESSION: 1. No acute findings abdomen pelvis. 2. Post partial sigmoid colectomy without complicating features. 3. Post appendectomy and cholecystectomy as well as hysterectomy. 4.  Aortic Atherosclerosis (ICD10-I70.0).   GES 03/16/22: Normal   CT PE protonocol 07/02/23: IMPRESSION: 1. No evidence of arterial dilatation or embolus. 2. Aortic and coronary artery atherosclerosis. 3. Emphysema. 4. Mild central bronchial thickening without evidence of bronchitis or pneumonia. 5. Hepatic cysts.  US abdomen 07/04/23: IMPRESSION: 1. No acute process. 2. Status post cholecystectomy.    Past Medical History:  Diagnosis Date   Allergy    ANEMIA, IRON DEFICIENCY 03/19/2009   Qualifier: Diagnosis of   By: Janit Bern         Anxiety    Barrett esophagus    Bladder pain     Cataract    Chronic bronchitis (HCC)    per pt last episode july 2016   Complication of anesthesia    slow to wake   COPD (chronic obstructive pulmonary disease) (HCC)    Depression    Diverticulosis of colon (without mention of hemorrhage) 03/14/2007   Centricity Description: DIVERTICULOSIS, COLON  Qualifier: Diagnosis of   By: Misty Stanley CMA Duncan Dull), Amanda      Centricity Description: DIVERTICULOSIS-COLON  Qualifier: Diagnosis of   By: Myrtie Hawk, Amy S     Replacing diagnoses that were inactivated after the 10/30/22 regulatory import     Exocrine pancreatic insufficiency    Frequency of urination    GERD (gastroesophageal reflux disease)    Hiatal hernia    History of adenomatous polyp of colon    tubular adenoma   History of chronic gastritis    History of diverticulitis 03/02/2014   History of diverticulitis of colon    History  of esophageal dilatation    for stricture   History of esophageal spasm    Hyperlipidemia    Hypertension    IBS 11/13/2007   Qualifier: Diagnosis of   By: Misty Stanley CMA (AAMA), Amanda         IBS (irritable bowel syndrome)    Interstitial cystitis    PAD (peripheral artery disease) (HCC)    Partial small bowel obstruction (HCC) 09/04/2019   Productive cough    intermittant   SBO (small bowel obstruction) (HCC) 09/19/2019   Smokers' cough (HCC)    Urgency of urination      Past Surgical History:  Procedure Laterality Date   ABDOMINAL AORTOGRAM N/A 10/14/2018   Procedure: ABDOMINAL AORTOGRAM;  Surgeon: Runell Gess, MD;  Location: [facility] INVASIVE CV LAB;  Service: Cardiovascular;  Laterality: N/A;   ABDOMINAL HYSTERECTOMY  1975   ANTERIOR CERVICAL DECOMP/DISCECTOMY FUSION N/A 09/07/2014   Procedure: Exploration of Fusion and Removal of Anterior Cervical Hematoma;  Surgeon: Tressie Stalker, MD;  Location: [facility] NEURO ORS;  Service: Neurosurgery;  Laterality: N/A;   CARPAL TUNNEL RELEASE Right 1993   COLONOSCOPY  last one 04-30-2013   COLONOSCOPY  WITH PROPOFOL N/A 09/09/2019   Procedure: COLONOSCOPY WITH PROPOFOL;  Surgeon: Sherrilyn Rist, MD;  Location: [facility] ENDOSCOPY;  Service: Gastroenterology;  Laterality: N/A;   CYSTO WITH HYDRODISTENSION N/A 06/08/2015   Procedure: CYSTOSCOPY/HYDRODISTENSION INSTILLATION OF MARCAINE AND PYRIDIUM;  Surgeon: Bjorn Pippin, MD;  Location: [facility];  Service: Urology;  Laterality: N/A;   CYSTO/  HOD/  BLADDER BX  1990's   ESOPHAGOGASTRODUODENOSCOPY  last one 09-11-2012   LAPAROSCOPIC APPENDECTOMY N/A 09/21/2013   Procedure: APPENDECTOMY LAPAROSCOPIC;  Surgeon: Emelia Loron, MD;  Location: [facility] OR;  Service: General;  Laterality: N/A;   LAPAROSCOPIC CHOLECYSTECTOMY  2002   LAPAROSCOPY SIGMOID COLECTOMY  10-05-2008   diverticulitis   LOWER EXTREMITY ANGIOGRAPHY Left 10/14/2018   LOWER EXTREMITY ANGIOGRAPHY Bilateral 10/14/2018   Procedure: Lower Extremity Angiography;  Surgeon: Runell Gess, MD;  Location: [facility] INVASIVE CV LAB;  Service: Cardiovascular;  Laterality: Bilateral;  ILIACS   MICROLARYNGOSCOPY  09-27-2006   w/ true vocal cord stripping  and bilateral bx's of lesion's (benign)   PERIPHERAL VASCULAR INTERVENTION  10/14/2018   Procedure: PERIPHERAL VASCULAR INTERVENTION;  Surgeon: Runell Gess, MD;  Location: [facility] INVASIVE CV LAB;  Service: Cardiovascular;;   SINUS SURGERY WITH INSTATRAK  1991   TEMPOROMANDIBULAR JOINT SURGERY  1990   TONSILLECTOMY AND ADENOIDECTOMY  1964   Family History  Problem Relation Age of Onset   Pancreatic cancer Mother    Colon polyps Mother    Pancreatic cancer Father    Diabetes Father    Heart disease Father    Leukemia Father    Prostate cancer Father    Hypertension Father    Heart disease Brother    Heart attack Brother    Other Daughter        prediabetes   Heart disease Daughter    Diabetes Daughter    Colon cancer Neg Hx    Esophageal cancer Neg Hx    Rectal cancer Neg Hx    Stomach cancer Neg Hx    Social History    Tobacco Use   Smoking status: Every Day    Current packs/day: 0.50    Average packs/day: 0.7 packs/day for 48.1 years (35.8 ttl pk-yrs)    Types: Cigarettes    Start date: 2024   Smokeless tobacco: Never   Tobacco  comments:    counseled about smoking cessation  Vaping Use   Vaping status: Never Used  Substance Use Topics   Alcohol use: No    Alcohol/week: 0.0 standard drinks of alcohol   Drug use: No   Current Outpatient Medications  Medication Sig Dispense Refill   aspirin EC 81 MG EC tablet Take 1 tablet (81 mg total) by mouth daily.     atorvastatin (LIPITOR) 40 MG tablet TAKE 1 TABLET EVERY DAY (NEED MD APPOINTMENT FOR REFILLS) 90 tablet 3   clopidogrel (PLAVIX) 75 MG tablet TAKE 1 TABLET EVERY DAY 90 tablet 2   dicyclomine (BENTYL) 10 MG capsule TAKE 1 CAPSULE BY MOUTH AS NEEDED FOR UP TO 1 DOSE FOR SPASMS. 90 capsule 0   Digestive Enzymes (DIGESTIVE ENZYME PO) Take by mouth daily. Patient taking after each meal     doxycycline (VIBRA-TABS) 100 MG tablet Take 1 tablet (100 mg total) by mouth 2 (two) times daily. 20 tablet 0   estradiol (CLIMARA - DOSED IN MG/24 HR) 0.1 mg/24hr patch Place 0.1 mg onto the skin every Monday.  0   Evolocumab (REPATHA SURECLICK) 140 MG/ML SOAJ INJECT 1 DOSE INTO THE SKIN EVERY 14 (FOURTEEN) DAYS. 2 mL 11   ezetimibe (ZETIA) 10 MG tablet Take 1 tablet (10 mg total) by mouth daily. 90 tablet 1   famotidine (PEPCID) 20 MG tablet Take 1 tablet (20 mg total) by mouth 2 (two) times daily. 180 tablet 3   fluticasone (FLONASE) 50 MCG/ACT nasal spray Place 2 sprays into both nostrils daily. 16 g 6   lisinopril (ZESTRIL) 20 MG tablet TAKE 1 TABLET EVERY DAY 90 tablet 1   LORazepam (ATIVAN) 0.5 MG tablet Take 0.5 mg by mouth at bedtime.   5   megestrol (MEGACE) 400 MG/10ML suspension Take 10 mLs (400 mg total) by mouth daily. 240 mL 0   methocarbamol (ROBAXIN) 500 MG tablet Take 1 tablet (500 mg total) by mouth every 8 (eight) hours as needed for muscle  spasms. 20 tablet 0   ondansetron (ZOFRAN) 4 MG tablet TAKE 1 TABLET (4 MG TOTAL) BY MOUTH EVERY 6 (SIX) HOURS AS NEEDED FOR NAUSEA OR VOMITING 45 tablet 2   pantoprazole (PROTONIX) 40 MG tablet TAKE 1 TABLET BY MOUTH EVERY DAY 90 tablet 1   polyethylene glycol (MIRALAX) 17 g packet Take 17 g by mouth 2 (two) times daily. 30 each 0   sertraline (ZOLOFT) 50 MG tablet Take 25 mg by mouth at bedtime.      Vitamin D, Ergocalciferol, (DRISDOL) 1.25 MG (50000 UNIT) CAPS capsule Take 1 capsule (50,000 Units total) by mouth every 7 (seven) days. 12 capsule 1   albuterol (VENTOLIN HFA) 108 (90 Base) MCG/ACT inhaler TAKE 2 PUFFS BY MOUTH EVERY 6 HOURS AS NEEDED 6.7 each 2   No current facility-administered medications for this visit.   Allergies  Allergen Reactions   Amoxicillin-Pot Clavulanate Shortness Of Breath, Itching and Swelling   Avelox [Moxifloxacin Hcl In Nacl] Swelling    Swelling, itching and shortness of breath   Ciprofloxacin Shortness Of Breath, Itching and Swelling   Keflex [Cephalexin] Shortness Of Breath and Swelling   Penicillins Shortness Of Breath, Itching and Swelling    Did it involve swelling of the face/tongue/throat, SOB, or low BP? Yes Did it involve sudden or severe rash/hives, skin peeling, or any reaction on the inside of your mouth or nose? No Did you need to seek medical attention at a hospital or doctor's office? Yes  When did it last happen?      Several years If all above answers are "NO", may proceed with cephalosporin use.    Sulfa Antibiotics Shortness Of Breath, Itching and Swelling   Prednisone Other (See Comments)    GI irritation; is able to tolerate injections   Acyclovir And Related Other (See Comments)    Pt does not recall this reaction   Clindamycin/Lincomycin Itching and Swelling   Levaquin [Levofloxacin] Hives   Zyvox [Linezolid] Diarrhea   Hydrocodone Rash     Review of Systems: All systems reviewed and negative except where noted in HPI.    Lab Results  Component Value Date   WBC 5.3 09/03/2023   HGB 13.9 09/03/2023   HCT 42.7 09/03/2023   MCV 94.7 09/03/2023   PLT 200.0 09/03/2023    Lab Results  Component Value Date   NA 139 09/03/2023   CL 101 09/03/2023   K 4.5 09/03/2023   CO2 31 09/03/2023   BUN 14 09/03/2023   CREATININE 0.75 09/03/2023   GFR 77.97 09/03/2023   CALCIUM 9.0 09/03/2023   ALBUMIN 4.2 09/03/2023   GLUCOSE 102 (H) 09/03/2023    Lab Results  Component Value Date   ALT 9 09/03/2023   AST 14 09/03/2023   ALKPHOS 60 09/03/2023   BILITOT 0.4 09/03/2023     Physical Exam: BP 122/70   Pulse (!) 53   Ht 5' 0.6" (1.539 m)   Wt 85 lb (38.6 kg)   BMI 16.27 kg/m  Constitutional: Pleasant, thin appearing female in no acute distress. HEENT: Normocephalic and atraumatic. Conjunctivae are normal. No scleral icterus. Neck supple.  Cardiovascular: Normal rate, regular rhythm.  Pulmonary/chest: Effort normal and breath sounds normal.  Abdominal: Soft, nondistended, nontender. There are no masses palpable.  Extremities: no edema Lymphadenopathy: No cervical adenopathy noted. Neurological: Alert and oriented to person place and time. Skin: Skin is warm and dry. No rashes noted. Psychiatric: Normal mood and affect. Behavior is normal.   ASSESSMENT: 78 y.o. female here for assessment of the following  1. Weight loss   2. Underweight   3. Tobacco use disorder    Chronically underweight with recent additional weight loss over the past 2 years, now down to 85 pounds.  Historically looking through her chart she has complained of abdominal pain and bowel changes in the past, as well as dysphagia.  I asked her about all of the symptoms today and she states she has none.  She states her appetite is good, no nausea or vomiting, she is what she wants and denies any problems with pain or her bowels that limit her.  I did see that she previously had a low pancreatic fecal elastase however that was done  in the setting of diarrhea and unclear how accurate that is.  She denies any steatorrhea clinically today however I would like to repeat pancreatic fecal elastase to assess for pancreatic insufficiency in light of her weight loss and if she has this we will treat her more aggressively for it.  I will also screen her for celiac disease with labs to make sure negative.  She feels confident she is eating enough to prevent weight loss however she really needs to count her calories.  I recommend in addition to 3 meals per day adding weight gainer / protein shake with high calories to help supplement which she is currently eating.  She seems motivated to do that.  We may need to have her see a nutritionist  to ensure that she is getting enough calories and what she is eating on a routine basis pending her course.  She was started on Megace for her appetite she reports today appears appropriate, unclear how much that will help but she wants to try it.  I spent some time discussing her tobacco use with her.  She has been smoking for very long time, has comorbidities, at risk for progression of her lung disease and malignancy.  I think if she stop smoking this would really help her overall health and perhaps she may gain some weight after she stopped smoking as well.  She declined help for this today but will await her course, she will try to cut back on her own.  Otherwise, she is adamant that she is getting enough oral intake.  Given her tobacco history she is at risk for malignancy.  With further weight loss I offered her CT scan of her abdomen pelvis to ensure we rule out malignancy, she wanted to proceed with that.  It will be scheduled.  Of note she is due for a colonoscopy but has not been able to be adequately prepped despite double preps on some exams, reported intolerance to prep in the past.  Will await her CT scan, consider colonoscopy if /when she thinks she could tolerate a double prep.   PLAN: -  schedule CT scan abdomen / pelvis - rule out malignancy - send for pancreatic fecal elastase stool sample - lab today - TTG IgA and total IgA - add protein / weight gainer protein shake to use daily - she was given trial of Megace will await course, her appetite currently seems okay - may need to see a nutritionist for calorie counting / education - tobacco cessation  Harlin Rain, MD [facility]  CC: Zola Button, [place] Congress, *

## 2023-09-18 LAB — NMR, LIPOPROFILE
Cholesterol, Total: 136 mg/dL (ref 100–199)
HDL Particle Number: 29 umol/L — ABNORMAL LOW (ref >=30.5)
HDL-C: 42 mg/dL (ref >39)
LDL Particle Number: 791 nmol/L (ref <1000)
LDL Size: 20.5 nmol — ABNORMAL LOW (ref >20.5)
LDL-C (NIH Calc): 77 mg/dL (ref 0–99)
LP-IR Score: 36 (ref <=45)
Small LDL Particle Number: 449 nmol/L (ref <=527)
Triglycerides: 88 mg/dL (ref 0–149)

## 2023-09-18 LAB — TISSUE TRANSGLUTAMINASE, IGA: (tTG) Ab, IgA: <1 U/mL

## 2023-09-18 LAB — SPECIMEN STATUS REPORT

## 2023-09-18 LAB — IGA: Immunoglobulin A: 115 mg/dL (ref 70–320)

## 2023-09-19 ENCOUNTER — Encounter: Payer: Self-pay | Admitting: Cardiovascular Disease

## 2023-09-19 ENCOUNTER — Encounter: Payer: Self-pay | Admitting: Gastroenterology

## 2023-09-24 ENCOUNTER — Encounter (HOSPITAL_BASED_OUTPATIENT_CLINIC_OR_DEPARTMENT_OTHER): Payer: Self-pay | Admitting: Emergency Medicine

## 2023-09-24 ENCOUNTER — Telehealth: Payer: Self-pay

## 2023-09-24 ENCOUNTER — Ambulatory Visit: Payer: Self-pay | Admitting: Family Medicine

## 2023-09-24 ENCOUNTER — Emergency Department (HOSPITAL_BASED_OUTPATIENT_CLINIC_OR_DEPARTMENT_OTHER)
Admission: EM | Admit: 2023-09-24 | Discharge: 2023-09-24 | Disposition: A | Discharge status: Home / Self Care | Payer: Medicare HMO | Attending: Emergency Medicine | Admitting: Emergency Medicine

## 2023-09-24 ENCOUNTER — Other Ambulatory Visit: Payer: Self-pay

## 2023-09-24 ENCOUNTER — Emergency Department (HOSPITAL_BASED_OUTPATIENT_CLINIC_OR_DEPARTMENT_OTHER): Payer: Medicare HMO

## 2023-09-24 DIAGNOSIS — Z7982 Long term (current) use of aspirin: Secondary | ICD-10-CM | POA: Insufficient documentation

## 2023-09-24 DIAGNOSIS — J40 Bronchitis, not specified as acute or chronic: Secondary | ICD-10-CM | POA: Insufficient documentation

## 2023-09-24 DIAGNOSIS — F17200 Nicotine dependence, unspecified, uncomplicated: Secondary | ICD-10-CM | POA: Diagnosis not present

## 2023-09-24 DIAGNOSIS — J449 Chronic obstructive pulmonary disease, unspecified: Secondary | ICD-10-CM | POA: Diagnosis not present

## 2023-09-24 DIAGNOSIS — R059 Cough, unspecified: Secondary | ICD-10-CM | POA: Diagnosis not present

## 2023-09-24 DIAGNOSIS — Z7901 Long term (current) use of anticoagulants: Secondary | ICD-10-CM | POA: Insufficient documentation

## 2023-09-24 DIAGNOSIS — R0602 Shortness of breath: Secondary | ICD-10-CM | POA: Diagnosis not present

## 2023-09-24 LAB — RESP PANEL BY RT-PCR (RSV, FLU A&B, COVID)  RVPGX2
Influenza A by PCR: NEGATIVE
Influenza B by PCR: NEGATIVE
Resp Syncytial Virus by PCR: NEGATIVE
SARS Coronavirus 2 by RT PCR: NEGATIVE

## 2023-09-24 MED ORDER — DEXAMETHASONE SODIUM PHOSPHATE 10 MG/ML IJ SOLN
10.0000 mg | Freq: Once | INTRAMUSCULAR | Status: AC
  Administered 2023-09-24: 10 mg via INTRAMUSCULAR
  Filled 2023-09-24: qty 1

## 2023-09-24 MED ORDER — ALBUTEROL SULFATE HFA 108 (90 BASE) MCG/ACT IN AERS
2.0000 | INHALATION_SPRAY | Freq: Once | RESPIRATORY_TRACT | Status: AC
  Administered 2023-09-24: 2 via RESPIRATORY_TRACT
  Filled 2023-09-24: qty 6.7

## 2023-09-24 MED ORDER — DOXYCYCLINE HYCLATE 100 MG PO TABS
100.0000 mg | ORAL_TABLET | Freq: Once | ORAL | Status: AC
  Administered 2023-09-24: 100 mg via ORAL
  Filled 2023-09-24: qty 1

## 2023-09-24 MED ORDER — DOXYCYCLINE HYCLATE 100 MG PO CAPS
100.0000 mg | ORAL_CAPSULE | Freq: Two times a day (BID) | ORAL | 0 refills | Status: AC
Start: 2023-09-24 — End: 2023-10-01

## 2023-09-24 NOTE — ED Triage Notes (Signed)
 Pt via POV c/o congestion, SOB, dry cough, left ear pain, and left facial/sinus pain x 4 days.

## 2023-09-24 NOTE — Telephone Encounter (Signed)
 Copied from CRM [id number]. Topic: General - Other >> Sep 24, 2023  2:11 PM Sim Boast F wrote: Reason for CRM: Patient called to let clinic know that she is on her way to the emergency room for shortness of breath, nasal and head congestion

## 2023-09-24 NOTE — Telephone Encounter (Signed)
 Chief Complaint: shortness of breath, nasal and head congestion Symptoms: see above Frequency: since Thursday Pertinent Negatives: Patient denies n/a Disposition: [x] ED /[] Urgent Care (no appt availability in office) / [] Appointment(In office/virtual)/ []  [facility]/ [] Home Care/ [] Refused Recommended Disposition /[] [facility]/ []  Follow-up with PCP Additional Notes: patient called in stating she has been experiencing shortness of breath since Thursday. Patient audibly speaking with shortness of breath now. Patient retrieved pulse ox she has at home while on phone with this RN and oxygen level at rest was reading 92 with heart rate elevating over 100 bpm. Patient states she feels very weak. Advised patient to be seen in ED for evaluation. Patient states she will call her children to advise for ride. Recommended patient call back with any needs.    Copied from CRM [id number]. Topic: Clinical - Red Word Triage >> Sep 24, 2023  1:27 PM [place] E wrote: [facility] that prompted transfer to Nurse Triage: Patient has been experiencing some shortness of breath the past 3 days, along with head and sinus congestion. Reason for Disposition  Patient sounds very sick or weak to the triager  Answer Assessment - Initial Assessment Questions 1. RESPIRATORY STATUS: "Describe your breathing?" (e.g., wheezing, shortness of breath, unable to speak, severe coughing)      Shortness of breath, mild wheezing 2. ONSET: "When did this breathing problem begin?"       Thursday 3. PATTERN "Does the difficult breathing come and go, or has it been constant since it started?"      Constant 4. SEVERITY: "How bad is your breathing?" (e.g., mild, moderate, severe)    - MILD: No SOB at rest, mild SOB with walking, speaks normally in sentences, can lie down, no retractions, pulse < 100.    - MODERATE: SOB at rest, SOB with minimal exertion and prefers to sit, cannot lie down flat, speaks in phrases, mild  retractions, audible wheezing, pulse 100-120.    - SEVERE: Very SOB at rest, speaks in single words, struggling to breathe, sitting hunched forward, retractions, pulse > 120      Moderate 5. RECURRENT SYMPTOM: "Have you had difficulty breathing before?" If Yes, ask: "When was the last time?" and "What happened that time?"      Unsure  6. CARDIAC HISTORY: "Do you have any history of heart disease?" (e.g., heart attack, angina, bypass surgery, angioplasty)      Stents in left leg 7. LUNG HISTORY: "Do you have any history of lung disease?"  (e.g., pulmonary embolus, asthma, emphysema)     No 8. CAUSE: "What do you think is causing the breathing problem?"      Sinus and head congestion 9. OTHER SYMPTOMS: "Do you have any other symptoms? (e.g., dizziness, runny nose, cough, chest pain, fever)     Sinus congestion, head congestion 10. O2 SATURATION MONITOR:  "Do you use an oxygen saturation monitor (pulse oximeter) at home?" If Yes, ask: "What is your reading (oxygen level) today?" "What is your usual oxygen saturation reading?" (e.g., 95%)       Oxygen of 92, heart rate of 101  Protocols used: Breathing Difficulty-A-AH

## 2023-09-24 NOTE — Discharge Instructions (Signed)
 Take next dose of antibiotic tomorrow.  Return if symptoms worsen.  Follow-up with your primary care doctor.

## 2023-09-24 NOTE — Telephone Encounter (Signed)
 FYI

## 2023-09-24 NOTE — ED Notes (Signed)
 Patient given discharge instructions. Questions were answered. Patient verbalized understanding of discharge instructions and care at home.

## 2023-09-24 NOTE — ED Provider Notes (Signed)
 [facility] Provider Note   CSN: [id number] Arrival date & time: 09/24/23  1457     History  Chief Complaint  Patient presents with   Nasal Congestion   Facial Pain    Leah [name] is a 78 y.o. female.  Patient here with congestion cough smoking/COPD history.  No fever no chills.  Nothing makes it worse or better.  Denies any chest pain.  Symptoms for the last couple days.  Having some sinus congestion as well.  Denies any nausea vomiting diarrhea.  The history is provided by the patient.       Home Medications Prior to Admission medications   Medication Sig Start Date End Date Taking? Authorizing Provider  doxycycline (VIBRAMYCIN) 100 MG capsule Take 1 capsule (100 mg total) by mouth 2 (two) times daily for 7 days. 09/24/23 10/01/23 Yes Calum Cormier, DO  albuterol (VENTOLIN HFA) 108 (90 Base) MCG/ACT inhaler TAKE 2 PUFFS BY MOUTH EVERY 6 HOURS AS NEEDED 06/08/23 07/08/23  Saguier, Ramon Dredge, PA-C  aspirin EC 81 MG EC tablet Take 1 tablet (81 mg total) by mouth daily. 10/15/18   Arty Baumgartner, NP  atorvastatin (LIPITOR) 40 MG tablet TAKE 1 TABLET EVERY DAY (NEED MD APPOINTMENT FOR REFILLS) 04/20/23   Runell Gess, MD  clopidogrel (PLAVIX) 75 MG tablet TAKE 1 TABLET EVERY DAY 08/15/23   Hilty, Lisette Abu, MD  dicyclomine (BENTYL) 10 MG capsule TAKE 1 CAPSULE BY MOUTH AS NEEDED FOR UP TO 1 DOSE FOR SPASMS. 06/12/22   Sherrilyn Rist, MD  Digestive Enzymes (DIGESTIVE ENZYME PO) Take by mouth daily. Patient taking after each meal    [provider]  estradiol (CLIMARA - DOSED IN MG/24 HR) 0.1 mg/24hr patch Place 0.1 mg onto the skin every Monday. 02/08/16   [provider]  Evolocumab (REPATHA SURECLICK) 140 MG/ML SOAJ INJECT 1 DOSE INTO THE SKIN EVERY 14 (FOURTEEN) DAYS. 12/01/22   Hilty, Lisette Abu, MD  ezetimibe (ZETIA) 10 MG tablet Take 1 tablet (10 mg total) by mouth daily. 08/06/23   Donato Schultz, DO   famotidine (PEPCID) 20 MG tablet Take 1 tablet (20 mg total) by mouth 2 (two) times daily. 03/06/23   Seabron Spates R, DO  fluticasone (FLONASE) 50 MCG/ACT nasal spray Place 2 sprays into both nostrils daily. 06/28/21   Seabron Spates R, DO  lisinopril (ZESTRIL) 20 MG tablet TAKE 1 TABLET EVERY DAY 07/30/23   Zola Button, Grayling Congress, DO  LORazepam (ATIVAN) 0.5 MG tablet Take 0.5 mg by mouth at bedtime.  12/04/14   [provider]  megestrol (MEGACE) 400 MG/10ML suspension Take 10 mLs (400 mg total) by mouth daily. 09/03/23   Donato Schultz, DO  methocarbamol (ROBAXIN) 500 MG tablet Take 1 tablet (500 mg total) by mouth every 8 (eight) hours as needed for muscle spasms. 09/03/23   Zola Button, Yvonne R, DO  ondansetron (ZOFRAN) 4 MG tablet TAKE 1 TABLET (4 MG TOTAL) BY MOUTH EVERY 6 (SIX) HOURS AS NEEDED FOR NAUSEA OR VOMITING 06/12/22   Sherrilyn Rist, MD  pantoprazole (PROTONIX) 40 MG tablet TAKE 1 TABLET BY MOUTH EVERY DAY 04/09/23   Zola Button, Grayling Congress, DO  polyethylene glycol (MIRALAX) 17 g packet Take 17 g by mouth 2 (two) times daily. 09/09/19   Narda Bonds, MD  sertraline (ZOLOFT) 50 MG tablet Take 25 mg by mouth at bedtime.  [provider]  Vitamin D, Ergocalciferol, (DRISDOL) 1.25 MG (50000 UNIT) CAPS capsule Take 1 capsule (50,000 Units total) by mouth every 7 (seven) days. 09/03/23   Donato Schultz, DO      Allergies    Amoxicillin-pot clavulanate, Avelox [moxifloxacin hcl in nacl], Ciprofloxacin, Keflex [cephalexin], Penicillins, Sulfa antibiotics, Prednisone, Acyclovir and related, Clindamycin/lincomycin, Levaquin [levofloxacin], Zyvox [linezolid], and Hydrocodone    Review of Systems   Review of Systems  Physical Exam Updated Vital Signs BP (!) 133/97   Pulse 71   Temp 98.9 F (37.2 C)   Resp (!) 23   Ht 5' 0.5" (1.537 m)   Wt 38.6 kg   SpO2 97%   BMI 16.33 kg/m  Physical Exam Vitals and nursing note reviewed.  Constitutional:       General: She is not in acute distress.    Appearance: She is well-developed. She is not ill-appearing.  HENT:     Head: Normocephalic and atraumatic.     Nose: Congestion present.  Eyes:     Conjunctiva/sclera: Conjunctivae normal.  Cardiovascular:     Rate and Rhythm: Normal rate and regular rhythm.     Pulses: Normal pulses.     Heart sounds: No murmur heard. Pulmonary:     Effort: Pulmonary effort is normal. No respiratory distress.     Comments: Mild end expiratory wheezing Abdominal:     Palpations: Abdomen is soft.     Tenderness: There is no abdominal tenderness.  Musculoskeletal:        General: No swelling.     Cervical back: Normal range of motion and neck supple.  Skin:    General: Skin is warm and dry.     Capillary Refill: Capillary refill takes less than 2 seconds.  Neurological:     General: No focal deficit present.     Mental Status: She is alert.  Psychiatric:        Mood and Affect: Mood normal.     ED Results / Procedures / Treatments   Labs (all labs ordered are listed, but only abnormal results are displayed) Labs Reviewed  RESP PANEL BY RT-PCR (RSV, FLU A&B, COVID)  [id number]    EKG EKG Interpretation Date/Time:  Monday September 24 2023 15:17:08 EST Ventricular Rate:  77 PR Interval:  130 QRS Duration:  66 QT Interval:  446 QTC Calculation: 504 R Axis:   79  Text Interpretation: Normal sinus rhythm Right atrial enlargement Confirmed by Virgina Norfolk [phone]) on 09/24/2023 3:19:48 PM  Radiology DG Chest 2 View Result Date: 09/24/2023 CLINICAL DATA:  Cough.  Shortness of breath. EXAM: CHEST - 2 VIEW COMPARISON:  07/02/2023. FINDINGS: Bilateral lungs appear hyperexpanded and hyperlucent with coarse bronchovascular markings, in keeping with COPD. Bilateral lungs otherwise appear clear. No dense consolidation or lung collapse. Bilateral costophrenic angles are clear. Normal cardio-mediastinal silhouette. No acute osseous abnormalities. Lower  cervical spinal fixation hardware noted. The soft tissues are within normal limits. IMPRESSION: *No active cardiopulmonary disease. COPD. Electronically Signed   By: Jules Schick M.D.   On: 09/24/2023 16:41    Procedures Procedures    Medications Ordered in ED Medications  dexamethasone (DECADRON) injection 10 mg (has no administration in time range)  doxycycline (VIBRA-TABS) tablet 100 mg (has no administration in time range)  albuterol (VENTOLIN HFA) 108 (90 Base) MCG/ACT inhaler 2 puff (has no administration in time range)    ED Course/ Medical Decision Making/ A&P  Medical Decision Making Amount and/or Complexity of Data Reviewed Radiology: ordered.  Risk Prescription drug management.   Leah [name] is here for cough and congestion.  History of COPD bronchitis hypertension high cholesterol.  Very well-appearing.  Differential likely URI/bronchitis will check for COVID flu RSV pneumonia.  Chest x-ray ordered.  EKG shows sinus rhythm.  No ischemic changes.  She got mild end expiratory wheeze on exam.  No signs of volume overload.  No chest pain.  I have no concern for ACS or PE.  Per my review and interpretation of labs and imaging there is no pneumonia or pneumothorax.  No COVID flu or RSV.  Overall patient given albuterol Decadron IM, doxycycline.  Will treat for bronchitis.  Discharged in good condition.  Understands return precautions.  This chart was dictated using voice recognition software.  Despite best efforts to proofread,  errors can occur which can change the documentation meaning.         Final Clinical Impression(s) / ED Diagnoses Final diagnoses:  Bronchitis    Rx / DC Orders ED Discharge Orders          Ordered    doxycycline (VIBRAMYCIN) 100 MG capsule  2 times daily        09/24/23 1939              Virgina Norfolk, DO 09/24/23 1941

## 2023-09-25 ENCOUNTER — Other Ambulatory Visit: Payer: Self-pay | Admitting: Family Medicine

## 2023-09-25 DIAGNOSIS — R634 Abnormal weight loss: Secondary | ICD-10-CM

## 2023-09-26 ENCOUNTER — Ambulatory Visit (HOSPITAL_COMMUNITY): Payer: Medicare HMO

## 2023-09-28 ENCOUNTER — Other Ambulatory Visit: Payer: Medicare HMO

## 2023-09-28 DIAGNOSIS — F17200 Nicotine dependence, unspecified, uncomplicated: Secondary | ICD-10-CM | POA: Diagnosis not present

## 2023-09-28 DIAGNOSIS — R636 Underweight: Secondary | ICD-10-CM | POA: Diagnosis not present

## 2023-09-28 DIAGNOSIS — R634 Abnormal weight loss: Secondary | ICD-10-CM

## 2023-10-01 ENCOUNTER — Ambulatory Visit (INDEPENDENT_AMBULATORY_CARE_PROVIDER_SITE_OTHER): Payer: Medicare HMO | Admitting: Family Medicine

## 2023-10-01 ENCOUNTER — Encounter: Payer: Self-pay | Admitting: Family Medicine

## 2023-10-01 VITALS — BP 118/78 | HR 71 | Temp 97.8°F | Resp 18 | Ht 60.5 in | Wt 84.8 lb

## 2023-10-01 DIAGNOSIS — E440 Moderate protein-calorie malnutrition: Secondary | ICD-10-CM | POA: Diagnosis not present

## 2023-10-01 DIAGNOSIS — E43 Unspecified severe protein-calorie malnutrition: Secondary | ICD-10-CM | POA: Insufficient documentation

## 2023-10-01 DIAGNOSIS — F17200 Nicotine dependence, unspecified, uncomplicated: Secondary | ICD-10-CM

## 2023-10-01 LAB — CALPROTECTIN, FECAL: Calprotectin, Fecal: 129 ug/g — ABNORMAL HIGH (ref 0–120)

## 2023-10-01 NOTE — Assessment & Plan Note (Signed)
D/w pt about smoking cessation.   

## 2023-10-01 NOTE — Assessment & Plan Note (Signed)
 Con't with high cal protein per GI CT pending

## 2023-10-01 NOTE — Progress Notes (Signed)
 Established Patient Office Visit  Subjective   Patient ID: Leah [name], female    DOB: Nov 29, 1945  Age: 78 y.o. MRN: [id number]  Chief Complaint  Patient presents with   Weight Loss   Follow-up    HPI Discussed the use of AI scribe software for clinical note transcription with the patient, who gave verbal consent to proceed.  History of Present Illness   Leah [name] is a 78 year old female who presents with unexplained weight loss and gastrointestinal symptoms.  She has experienced significant weight loss despite consuming approximately 1700 calories daily. She has difficulty gaining weight and uses a protein drink, Pro Performer Weight Gain, but experiences bloating and belching after consumption. She also uses lactose-free milk to avoid diarrhea, as regular milk causes gastrointestinal upset.  She recalls a previous ultrasound that identified a small cyst on her liver, but no CT scan of the abdomen has been performed yet. She has undergone blood work, which she believes should have indicated any serious conditions, but she is informed that early detection might not show up in blood tests.  She experiences cold and tingling sensations in her legs and feet, which is attributed to her low body weight.  She has a history of smoking but has reduced her intake to half a pack a day.  She recently lost her husband in October, which she acknowledges may contribute to her current state of depression. She has not been seeing a nutritionist but is open to the idea to help manage her weight issues.      Patient Active Problem List   Diagnosis Date Noted   Moderate protein-calorie malnutrition (HCC) 10/01/2023   Vitamin D deficiency 09/03/2023   Weight loss, abnormal 03/24/2022   Acute cough 06/28/2021   Acute pain of right shoulder 09/07/2020   Cold intolerance 09/07/2020   SBO (small bowel obstruction) (HCC) 09/19/2019   Current every day smoker 09/19/2019    Diverticulitis 09/19/2019   Partial small bowel obstruction (HCC) 09/04/2019   Preventative health care 08/27/2019   Family history of coronary artery disease 02/17/2019   Claudication in peripheral vascular disease (HCC) 10/14/2018   Dyspnea 09/03/2018   Calcification of native coronary artery 09/03/2018   PAD (peripheral artery disease) (HCC) 09/03/2018   History of esophageal stricture 06/19/2018   Rhinitis, chronic 04/23/2017   Small bowel obstruction due to adhesions (HCC) 04/08/2016   Frozen shoulder 04/27/2015   Hematoma 09/07/2014   Cervical spondylosis with myelopathy 08/18/2014   Cervical spondylosis with radiculopathy 08/18/2014   Neck pain 05/19/2014   Fatigue 05/08/2014   Back pain 05/08/2014   Pain in joint, upper arm 05/08/2014   LLQ abdominal pain 03/02/2014   History of diverticulitis 03/02/2014   COPD exacerbation (HCC) 01/28/2014   Oral thrush 01/28/2014   Influenza 10/27/2013   Appendicitis 09/19/2013   Abdominal pain, left lower quadrant 03/05/2013   Essential hypertension 02/07/2013   Sinusitis, acute 01/15/2013   COPD GOLD II 01/15/2013   Smoker 12/20/2012   Epigastric pain 09/03/2012   Left lower quadrant pain 01/24/2011   Leg pain, left 06/07/2009   ROSACEA 04/16/2009   B12 DEFICIENCY 04/09/2009   ANEMIA, IRON DEFICIENCY 03/19/2009   MALAISE AND FATIGUE 03/19/2009   NECK PAIN, LEFT 01/11/2009   URI 07/30/2008   Constipation 05/26/2008   NAUSEA AND VOMITING 05/26/2008   Abdominal pain 05/26/2008   Claudication (HCC) 04/27/2008   VOMITING 04/17/2008   Dysphagia 04/17/2008   ANXIETY DEPRESSION 03/12/2008   TREMOR  03/12/2008   Hyperlipidemia 11/13/2007   DEPRESSION 11/13/2007   ESOPHAGEAL SPASM 11/13/2007   GERD (gastroesophageal reflux disease) 11/13/2007   IBS 11/13/2007   CARPAL TUNNEL SYNDROME, HX OF 11/13/2007   Diverticulosis of colon (without mention of hemorrhage) 03/14/2007   GASTRITIS, CHRONIC 04/10/2002   HIATAL HERNIA 04/10/2002    Past Medical History:  Diagnosis Date   Allergy    ANEMIA, IRON DEFICIENCY 03/19/2009   Qualifier: Diagnosis of   By: Janit Bern         Anxiety    Barrett esophagus    Bladder pain    Cataract    Chronic bronchitis (HCC)    per pt last episode july 2016   Complication of anesthesia    slow to wake   COPD (chronic obstructive pulmonary disease) (HCC)    Depression    Diverticulosis of colon (without mention of hemorrhage) 03/14/2007   Centricity Description: DIVERTICULOSIS, COLON  Qualifier: Diagnosis of   By: Misty Stanley CMA Duncan Dull), Amanda      Centricity Description: DIVERTICULOSIS-COLON  Qualifier: Diagnosis of   By: Myrtie Hawk, Amy S     Replacing diagnoses that were inactivated after the 10/30/22 regulatory import     Exocrine pancreatic insufficiency    Frequency of urination    GERD (gastroesophageal reflux disease)    Hiatal hernia    History of adenomatous polyp of colon    tubular adenoma   History of chronic gastritis    History of diverticulitis 03/02/2014   History of diverticulitis of colon    History of esophageal dilatation    for stricture   History of esophageal spasm    Hyperlipidemia    Hypertension    IBS 11/13/2007   Qualifier: Diagnosis of   By: Misty Stanley CMA (AAMA), Amanda         IBS (irritable bowel syndrome)    Interstitial cystitis    PAD (peripheral artery disease) (HCC)    Partial small bowel obstruction (HCC) 09/04/2019   Productive cough    intermittant   SBO (small bowel obstruction) (HCC) 09/19/2019   Smokers' cough (HCC)    Urgency of urination    Past Surgical History:  Procedure Laterality Date   ABDOMINAL AORTOGRAM N/A 10/14/2018   Procedure: ABDOMINAL AORTOGRAM;  Surgeon: Runell Gess, MD;  Location: [facility];  Service: Cardiovascular;  Laterality: N/A;   ABDOMINAL HYSTERECTOMY  1975   ANTERIOR CERVICAL DECOMP/DISCECTOMY FUSION N/A 09/07/2014   Procedure: Exploration of Fusion and Removal of Anterior Cervical  Hematoma;  Surgeon: Tressie Stalker, MD;  Location: [facility] NEURO ORS;  Service: Neurosurgery;  Laterality: N/A;   CARPAL TUNNEL RELEASE Right 1993   COLONOSCOPY  last one 04-30-2013   COLONOSCOPY WITH PROPOFOL N/A 09/09/2019   Procedure: COLONOSCOPY WITH PROPOFOL;  Surgeon: Sherrilyn Rist, MD;  Location: [facility];  Service: Gastroenterology;  Laterality: N/A;   CYSTO WITH HYDRODISTENSION N/A 06/08/2015   Procedure: CYSTOSCOPY/HYDRODISTENSION INSTILLATION OF MARCAINE AND PYRIDIUM;  Surgeon: Bjorn Pippin, MD;  Location: [facility];  Service: Urology;  Laterality: N/A;   CYSTO/  HOD/  BLADDER BX  1990's   ESOPHAGOGASTRODUODENOSCOPY  last one 09-11-2012   LAPAROSCOPIC APPENDECTOMY N/A 09/21/2013   Procedure: APPENDECTOMY LAPAROSCOPIC;  Surgeon: Emelia Loron, MD;  Location: [facility] OR;  Service: General;  Laterality: N/A;   LAPAROSCOPIC CHOLECYSTECTOMY  2002   LAPAROSCOPY SIGMOID COLECTOMY  10-05-2008   diverticulitis   LOWER EXTREMITY ANGIOGRAPHY Left 10/14/2018   LOWER EXTREMITY ANGIOGRAPHY Bilateral 10/14/2018  Procedure: Lower Extremity Angiography;  Surgeon: Runell Gess, MD;  Location: [facility] INVASIVE CV LAB;  Service: Cardiovascular;  Laterality: Bilateral;  ILIACS   MICROLARYNGOSCOPY  09-27-2006   w/ true vocal cord stripping  and bilateral bx's of lesion's (benign)   PERIPHERAL VASCULAR INTERVENTION  10/14/2018   Procedure: PERIPHERAL VASCULAR INTERVENTION;  Surgeon: Runell Gess, MD;  Location: [facility]VAS[facility] CV LAB;  Service: Cardiovascular;;   SINUS SURGERY WITH INSTATRAK  1991   TEMPOROMANDIBULAR JOINT SURGERY  1990   TONSILLECTOMY AND ADENOIDECTOMY  1964   Social History   Tobacco Use   Smoking status: Every Day    Current packs/day: 0.50    Average packs/day: 0.7 packs/day for 48.2 years (35.8 ttl pk-yrs)    Types: Cigarettes    Start date: 2024   Smokeless tobacco: Never   Tobacco comments:    counseled about smoking cessation  Vaping Use   Vaping  status: Never Used  Substance Use Topics   Alcohol use: No    Alcohol/week: 0.0 standard drinks of alcohol   Drug use: No   Social History   Socioeconomic History   Marital status: Married    Spouse name: Not on file   Number of children: 3   Years of education: Not on file   Highest education level: Not on file  Occupational History   Not on file  Tobacco Use   Smoking status: Every Day    Current packs/day: 0.50    Average packs/day: 0.7 packs/day for 48.2 years (35.8 ttl pk-yrs)    Types: Cigarettes    Start date: 2024   Smokeless tobacco: Never   Tobacco comments:    counseled about smoking cessation  Vaping Use   Vaping status: Never Used  Substance and Sexual Activity   Alcohol use: No    Alcohol/week: 0.0 standard drinks of alcohol   Drug use: No   Sexual activity: Not Currently    Birth control/protection: Post-menopausal  Other Topics Concern   Not on file  Social History Narrative   Not on file   Social Drivers of Health   Financial Resource Strain: Not on file  Food Insecurity: Not on file  Transportation Needs: Not on file  Physical Activity: Not on file  Stress: Not on file  Social Connections: Not on file  Intimate Partner Violence: Not on file   Family Status  Relation Name Status   Mother  Deceased   Father  Deceased   Sister  Alive   Brother  Alive   MGM  Deceased   MGF  Deceased   PGM  Deceased   PGF  Deceased   Daughter  Alive   Daughter  Alive   Son  Alive   Neg Hx  (Not Specified)  No partnership data on file   Family History  Problem Relation Age of Onset   Pancreatic cancer Mother    Colon polyps Mother    Pancreatic cancer Father    Diabetes Father    Heart disease Father    Leukemia Father    Prostate cancer Father    Hypertension Father    Heart disease Brother    Heart attack Brother    Other Daughter        prediabetes   Heart disease Daughter    Diabetes Daughter    Colon cancer Neg Hx    Esophageal cancer  Neg Hx    Rectal cancer Neg Hx    Stomach cancer Neg Hx  Allergies  Allergen Reactions   Amoxicillin-Pot Clavulanate Shortness Of Breath, Itching and Swelling   Avelox [Moxifloxacin Hcl In Nacl] Swelling    Swelling, itching and shortness of breath   Ciprofloxacin Shortness Of Breath, Itching and Swelling   Keflex [Cephalexin] Shortness Of Breath and Swelling   Penicillins Shortness Of Breath, Itching and Swelling    Did it involve swelling of the face/tongue/throat, SOB, or low BP? Yes Did it involve sudden or severe rash/hives, skin peeling, or any reaction on the inside of your mouth or nose? No Did you need to seek medical attention at a hospital or doctor's office? Yes When did it last happen?      Several years If all above answers are "NO", may proceed with cephalosporin use.    Sulfa Antibiotics Shortness Of Breath, Itching and Swelling   Prednisone Other (See Comments)    GI irritation; is able to tolerate injections   Acyclovir And Related Other (See Comments)    Pt does not recall this reaction   Clindamycin/Lincomycin Itching and Swelling   Levaquin [Levofloxacin] Hives   Zyvox [Linezolid] Diarrhea   Hydrocodone Rash      Review of Systems  Constitutional:  Negative for chills, fever and malaise/fatigue.  HENT:  Negative for congestion and hearing loss.   Eyes:  Negative for blurred vision and discharge.  Respiratory:  Negative for cough, sputum production and shortness of breath.   Cardiovascular:  Negative for chest pain, palpitations and leg swelling.  Gastrointestinal:  Negative for abdominal pain, blood in stool, constipation, diarrhea, heartburn, nausea and vomiting.  Genitourinary:  Negative for dysuria, frequency, hematuria and urgency.  Musculoskeletal:  Negative for back pain, falls and myalgias.  Skin:  Negative for rash.  Neurological:  Negative for dizziness, sensory change, loss of consciousness, weakness and headaches.  Endo/Heme/Allergies:   Negative for environmental allergies. Does not bruise/bleed easily.  Psychiatric/Behavioral:  Negative for depression and suicidal ideas. The patient is not nervous/anxious and does not have insomnia.       Objective:     BP 118/78 (BP Location: Left Arm, Patient Position: Sitting, Cuff Size: Small)   Pulse 71   Temp 97.8 F (36.6 C) (Oral)   Resp 18   Ht 5' 0.5" (1.537 m)   Wt 84 lb 12.8 oz (38.5 kg)   SpO2 100%   BMI 16.29 kg/m  BP Readings from Last 3 Encounters:  10/01/23 118/78  09/24/23 (!) 133/97  09/17/23 122/70   Wt Readings from Last 3 Encounters:  10/01/23 84 lb 12.8 oz (38.5 kg)  09/24/23 85 lb (38.6 kg)  09/17/23 85 lb (38.6 kg)   SpO2 Readings from Last 3 Encounters:  10/01/23 100%  09/24/23 98%  09/03/23 94%      Physical Exam Vitals and nursing note reviewed.  Constitutional:      General: She is not in acute distress.    Appearance: Normal appearance. She is well-developed.  HENT:     Head: Normocephalic and atraumatic.  Eyes:     General: No scleral icterus.       Right eye: No discharge.        Left eye: No discharge.  Cardiovascular:     Rate and Rhythm: Normal rate and regular rhythm.     Heart sounds: No murmur heard. Pulmonary:     Effort: Pulmonary effort is normal. No respiratory distress.     Breath sounds: Normal breath sounds.  Musculoskeletal:        General: Normal  range of motion.     Cervical back: Normal range of motion and neck supple.     Right lower leg: No edema.     Left lower leg: No edema.  Skin:    General: Skin is warm and dry.  Neurological:     Mental Status: She is alert and oriented to person, place, and time.  Psychiatric:        Mood and Affect: Mood normal.        Behavior: Behavior normal.        Thought Content: Thought content normal.        Judgment: Judgment normal.      No results found for any visits on 10/01/23.  hLast CBC Lab Results  Component Value Date   WBC 5.3 09/03/2023   HGB  13.9 09/03/2023   HCT 42.7 09/03/2023   MCV 94.7 09/03/2023   MCH 30.6 07/02/2023   RDW 13.8 09/03/2023   PLT 200.0 09/03/2023   Last metabolic panel Lab Results  Component Value Date   GLUCOSE 102 (H) 09/03/2023   NA 139 09/03/2023   K 4.5 09/03/2023   CL 101 09/03/2023   CO2 31 09/03/2023   BUN 14 09/03/2023   CREATININE 0.75 09/03/2023   GFR 77.97 09/03/2023   CALCIUM 9.0 09/03/2023   PROT 6.3 09/03/2023   ALBUMIN 4.2 09/03/2023   BILITOT 0.4 09/03/2023   ALKPHOS 60 09/03/2023   AST 14 09/03/2023   ALT 9 09/03/2023   ANIONGAP 5 09/08/2019   Last lipids Lab Results  Component Value Date   CHOL 142 09/03/2023   HDL 46.90 09/03/2023   LDLCALC 75 09/03/2023   LDLDIRECT 91.0 01/10/2022   TRIG 105.0 09/03/2023   CHOLHDL 3 09/03/2023   Last hemoglobin A1c Lab Results  Component Value Date   HGBA1C 6.2 07/05/2023   Last thyroid functions Lab Results  Component Value Date   TSH 1.44 09/03/2023   Last vitamin D Lab Results  Component Value Date   VD25OH 13.38 (L) 09/03/2023   Last vitamin B12 and Folate Lab Results  Component Value Date   VITAMINB12 348 03/06/2023   FOLATE 10.9 03/19/2009      The 10-year ASCVD risk score (Arnett DK, et al., 2019) is: 24.7%    Assessment & Plan:   Problem List Items Addressed This Visit       Unprioritized   Moderate protein-calorie malnutrition (HCC) - Primary   Con't with high cal protein per GI CT pending        Relevant Orders   Amb ref to Medical Nutrition Therapy-MNT   Smoker (Chronic)   D/w pt about smoking cessation       Assessment and Plan    Unintentional Weight Loss   Experiencing significant unintentional weight loss despite consuming approximately 1700 calories daily. Depression following the loss of her spouse in October may contribute to this issue. Previously prescribed megestrol caused stomach irritation. She has started using Pro Performer Weight Gain protein drinks but reports bloating  and belching. A CT scan with contrast is necessary to rule out malignancy and other potential causes, as early cancer detection may not appear in blood tests. The CT scan will provide a comprehensive view of the abdomen, including the liver, pancreas, and kidneys. Order a CT scan with contrast of the abdomen. Refer to a nutritionist for dietary management. Advise consuming protein drinks in addition to regular meals and splitting intake if necessary to avoid bloating.  Depression   Experiencing  depression, likely exacerbated by the recent loss of her spouse, which may contribute to weight loss and overall health decline. Discuss the importance of addressing mental health and the potential benefits of counseling or therapy. Encourage seeking counseling or therapy. Monitor mental health and consider psychiatric referral if symptoms persist or worsen.  Cold Extremities   Reports cold and tingling legs and feet, likely due to low body weight and possibly exacerbated by blood thinner medication. Monitor symptoms and consider further evaluation if they persist or worsen.  Smoking Cessation   Reduced smoking to half a pack per day. Discussed the benefits of quitting smoking and provided resources for support. Encourage continued reduction in smoking and provide resources for smoking cessation support.  General Health Maintenance   Advised to maintain a balanced diet and increase caloric intake to address weight loss. Also advised to quit smoking to improve overall health. Advise consuming a balanced diet with increased caloric intake. Encourage quitting smoking and provide resources for smoking cessation.  Follow-up   Schedule a follow-up appointment after CT scan results are available. Advise contacting the office with any questions or concerns before the next scheduled visit.        No follow-ups on file.    Donato Schultz, DO

## 2023-10-02 ENCOUNTER — Telehealth: Payer: Self-pay | Admitting: Gastroenterology

## 2023-10-02 ENCOUNTER — Ambulatory Visit (HOSPITAL_COMMUNITY)
Admission: RE | Admit: 2023-10-02 | Discharge: 2023-10-02 | Disposition: A | Discharge status: Home / Self Care | Payer: Medicare HMO | Source: Ambulatory Visit | Attending: Gastroenterology | Admitting: Gastroenterology

## 2023-10-02 ENCOUNTER — Other Ambulatory Visit: Payer: Self-pay

## 2023-10-02 DIAGNOSIS — F17200 Nicotine dependence, unspecified, uncomplicated: Secondary | ICD-10-CM | POA: Insufficient documentation

## 2023-10-02 DIAGNOSIS — R634 Abnormal weight loss: Secondary | ICD-10-CM | POA: Diagnosis not present

## 2023-10-02 DIAGNOSIS — R636 Underweight: Secondary | ICD-10-CM | POA: Insufficient documentation

## 2023-10-02 DIAGNOSIS — D181 Lymphangioma, any site: Secondary | ICD-10-CM | POA: Diagnosis not present

## 2023-10-02 DIAGNOSIS — K7689 Other specified diseases of liver: Secondary | ICD-10-CM | POA: Diagnosis not present

## 2023-10-02 MED ORDER — IOHEXOL 300 MG/ML  SOLN
100.0000 mL | Freq: Once | INTRAMUSCULAR | Status: AC | PRN
  Administered 2023-10-02: 100 mL via INTRAVENOUS

## 2023-10-02 NOTE — Telephone Encounter (Signed)
 See result note.

## 2023-10-02 NOTE — Telephone Encounter (Signed)
Patent is returning your call.

## 2023-10-04 ENCOUNTER — Other Ambulatory Visit

## 2023-10-04 DIAGNOSIS — R634 Abnormal weight loss: Secondary | ICD-10-CM | POA: Diagnosis not present

## 2023-10-04 DIAGNOSIS — R636 Underweight: Secondary | ICD-10-CM

## 2023-10-10 DIAGNOSIS — N3010 Interstitial cystitis (chronic) without hematuria: Secondary | ICD-10-CM | POA: Diagnosis not present

## 2023-10-10 DIAGNOSIS — N3946 Mixed incontinence: Secondary | ICD-10-CM | POA: Diagnosis not present

## 2023-10-10 LAB — PANCREATIC ELASTASE, FECAL: Pancreatic Elastase-1, Stool: 677 ug/g (ref >200)

## 2023-10-11 ENCOUNTER — Encounter: Payer: Self-pay | Admitting: Gastroenterology

## 2023-10-22 ENCOUNTER — Encounter: Payer: Self-pay | Admitting: Gastroenterology

## 2023-10-22 NOTE — Telephone Encounter (Signed)
 Inbound call from patient stating she would like to continue with endoscopy. Patient has been scheduled for 5/1 and PV for 4/25. Please advise, thank you.

## 2023-10-24 NOTE — Progress Notes (Signed)
 ok

## 2023-10-25 ENCOUNTER — Other Ambulatory Visit: Payer: Self-pay

## 2023-10-25 DIAGNOSIS — Z122 Encounter for screening for malignant neoplasm of respiratory organs: Secondary | ICD-10-CM

## 2023-10-25 DIAGNOSIS — Z87891 Personal history of nicotine dependence: Secondary | ICD-10-CM

## 2023-10-25 DIAGNOSIS — F17210 Nicotine dependence, cigarettes, uncomplicated: Secondary | ICD-10-CM

## 2023-10-30 ENCOUNTER — Telehealth: Payer: Self-pay | Admitting: Gastroenterology

## 2023-10-30 DIAGNOSIS — R109 Unspecified abdominal pain: Secondary | ICD-10-CM

## 2023-10-30 DIAGNOSIS — R110 Nausea: Secondary | ICD-10-CM

## 2023-10-30 DIAGNOSIS — R6881 Early satiety: Secondary | ICD-10-CM

## 2023-10-30 NOTE — Telephone Encounter (Signed)
 Patient called requested to speak with a nurse regarding rectal pain since Saturday.

## 2023-10-31 NOTE — Telephone Encounter (Signed)
 I have spoken to patient to advise that abdominal pain could be a result of underlying constipation despite feeling that she is having good bowel movements. Advised that it is recommended she have lab work completed and KUB to check stool burden. Order placed in EPIC. Patient is advised to begin taking benefiber 1 teaspoon daily, working her way to 1 tablespoon daily. She states that she already takes miralax daily. Advised dicyclomine can be taken 4 times daily as needed. Also discussed that if she has worsening severe abdominal pain, intractable nausea/vomiting, inability to pass gas/stool, she should be seen more emergently at the emergency room. She verbalizes understanding.

## 2023-10-31 NOTE — Telephone Encounter (Signed)
 Patient calls stating that she has been having constant left sided abdominal pain described as "aching" since Saturday. She denies any diarrhea or constipation. States "my bowels have been moving good.." She denies any rectal pain or rectal bleeding. No fever. Has had nausea but no vomiting and describes loss of appetite. She says that sometimes having bowel movements actually seems to make her abdominal pain increase. States she has been taking dicyclomine twice daily and this does not seem to help her symptoms.   Please advise.Marland KitchenMarland Kitchen

## 2023-10-31 NOTE — Telephone Encounter (Signed)
 Leah [name] thanks for letting me know - I think yield of KUB is pretty low, don't feel strongly she needs that. If she is moving her bowels regularly I don't think this will change management. Continue Miralax as she has. If she is feeling backed up she can take more Miralax if needed. Don't feel strongly she needs to add fiber. Would agree she should have labs to make sure okay. Recent CT scan looked okay - there is no diverticulosis there or on her prior colonoscopy. Could try adding IB gard to treat bowel spasm, can give her samples. If no improvement we can see her back in the office to examine her and determine next steps. Thanks

## 2023-11-01 ENCOUNTER — Other Ambulatory Visit (INDEPENDENT_AMBULATORY_CARE_PROVIDER_SITE_OTHER)

## 2023-11-01 ENCOUNTER — Ambulatory Visit
Admission: RE | Admit: 2023-11-01 | Discharge: 2023-11-01 | Disposition: A | Discharge status: Home / Self Care | Source: Ambulatory Visit | Attending: Gastroenterology | Admitting: Gastroenterology

## 2023-11-01 DIAGNOSIS — I700 Atherosclerosis of aorta: Secondary | ICD-10-CM | POA: Diagnosis not present

## 2023-11-01 DIAGNOSIS — R6881 Early satiety: Secondary | ICD-10-CM

## 2023-11-01 DIAGNOSIS — R109 Unspecified abdominal pain: Secondary | ICD-10-CM | POA: Diagnosis not present

## 2023-11-01 DIAGNOSIS — R110 Nausea: Secondary | ICD-10-CM

## 2023-11-01 DIAGNOSIS — Z95828 Presence of other vascular implants and grafts: Secondary | ICD-10-CM | POA: Diagnosis not present

## 2023-11-01 DIAGNOSIS — R1030 Lower abdominal pain, unspecified: Secondary | ICD-10-CM | POA: Diagnosis not present

## 2023-11-01 LAB — CBC WITH DIFFERENTIAL/PLATELET
Basophils Absolute: 0.1 10*3/uL (ref 0.0–0.1)
Basophils Relative: 1.2 % (ref 0.0–3.0)
Eosinophils Absolute: 0.3 10*3/uL (ref 0.0–0.7)
Eosinophils Relative: 4.3 % (ref 0.0–5.0)
HCT: 41.4 % (ref 36.0–46.0)
Hemoglobin: 13.9 g/dL (ref 12.0–15.0)
Lymphocytes Relative: 21.7 % (ref 12.0–46.0)
Lymphs Abs: 1.4 10*3/uL (ref 0.7–4.0)
MCHC: 33.6 g/dL (ref 30.0–36.0)
MCV: 93.5 fl (ref 78.0–100.0)
Monocytes Absolute: 0.5 10*3/uL (ref 0.1–1.0)
Monocytes Relative: 8.2 % (ref 3.0–12.0)
Neutro Abs: 4.1 10*3/uL (ref 1.4–7.7)
Neutrophils Relative %: 64.6 % (ref 43.0–77.0)
Platelets: 213 10*3/uL (ref 150.0–400.0)
RBC: 4.42 Mil/uL (ref 3.87–5.11)
RDW: 14 % (ref 11.5–15.5)
WBC: 6.4 10*3/uL (ref 4.0–10.5)

## 2023-11-01 LAB — COMPREHENSIVE METABOLIC PANEL WITH GFR
ALT: 14 U/L (ref 0–35)
AST: 18 U/L (ref 0–37)
Albumin: 4.3 g/dL (ref 3.5–5.2)
Alkaline Phosphatase: 67 U/L (ref 39–117)
BUN: 11 mg/dL (ref 6–23)
CO2: 31 meq/L (ref 19–32)
Calcium: 9.3 mg/dL (ref 8.4–10.5)
Chloride: 102 meq/L (ref 96–112)
Creatinine, Ser: 0.84 mg/dL (ref 0.40–1.20)
GFR: 67.98 mL/min (ref >60.00)
Glucose, Bld: 106 mg/dL — ABNORMAL HIGH (ref 70–99)
Potassium: 4.4 meq/L (ref 3.5–5.1)
Sodium: 140 meq/L (ref 135–145)
Total Bilirubin: 0.4 mg/dL (ref 0.2–1.2)
Total Protein: 6.7 g/dL (ref 6.0–8.3)

## 2023-11-08 DIAGNOSIS — Z01411 Encounter for gynecological examination (general) (routine) with abnormal findings: Secondary | ICD-10-CM | POA: Diagnosis not present

## 2023-11-08 DIAGNOSIS — N951 Menopausal and female climacteric states: Secondary | ICD-10-CM | POA: Diagnosis not present

## 2023-11-13 ENCOUNTER — Telehealth: Payer: Self-pay

## 2023-11-13 NOTE — Telephone Encounter (Signed)
 Hello,   This patient had an OV 2//17/2025. I do not see where clearance was obtained to hold her Plavix. There is a previous clearance for for a epidural saying it was ok to hold 5-7 days prior 6 months ago. I am not sure if we can use this or if a new one will need to be obtained/   Thank you

## 2023-11-13 NOTE — Telephone Encounter (Signed)
 [facility] Pre-operative Risk Assessment     Request for surgical clearance:     Endoscopy Procedure  What type of surgery is being performed?     Endoscopy  When is this surgery scheduled?     11/29/23  What type of clearance is required ?   Pharmacy  Are there any medications that need to be held prior to surgery and how long? Plavix for 5 days  Practice name and name of physician performing surgery?      [facility]  What is your office phone and fax number?      Phone- [phone]  Fax- [phone]  Anesthesia type (None, local, MAC, general) ?       MAC   Please route your response to Nola Battiest RN

## 2023-11-13 NOTE — Telephone Encounter (Signed)
 Note sent to cardiology requesting to hold Plavix for 5 days.

## 2023-11-14 NOTE — Telephone Encounter (Signed)
   Patient Name: Leah [name]  DOB: October 26, 1945 MRN: [id number]  Primary Cardiologist: Lauro Portal, MD  Chart reviewed as part of pre-operative protocol coverage. Given past medical history and time since last visit, based on ACC/AHA guidelines, Leah [name] is at acceptable risk for the planned procedure without further cardiovascular testing.   Patient was contacted this morning over the phone regarding state of health and any new cardiac complaints.  She reports that she is still active and denied any chest pain or claudication symptoms with activity.  She was advised to hold her Plavix 5 days prior to procedure and restart postprocedure when surgically safe.  She was also advised to contact our office and schedule a follow-up visit with Dr. Katheryne Pane.  She had no further questions and thanked me for the call today.  The patient was advised that if she develops new symptoms prior to surgery to contact our office to arrange for a follow-up visit, and she verbalized understanding.  I will route this recommendation to the requesting party via Epic fax function and remove from pre-op pool.  Please call with questions.  Francene Ing, Retha Cast, NP 11/14/2023, 11:46 AM

## 2023-11-14 NOTE — Telephone Encounter (Signed)
 Dr. Katheryne Pane has not seen this patient in almost 3 years.  It is probably fine to stop the Plavix 5 days preop for endoscopy.  Otherwise unless there is any active chest pain or claudication, should be fine to proceed with endoscopy without any cardiac evaluation.   Randene Bustard, MD

## 2023-11-21 ENCOUNTER — Ambulatory Visit (HOSPITAL_BASED_OUTPATIENT_CLINIC_OR_DEPARTMENT_OTHER)

## 2023-11-23 ENCOUNTER — Ambulatory Visit

## 2023-11-23 VITALS — Ht 60.5 in | Wt 86.0 lb

## 2023-11-23 DIAGNOSIS — R110 Nausea: Secondary | ICD-10-CM

## 2023-11-23 DIAGNOSIS — R634 Abnormal weight loss: Secondary | ICD-10-CM

## 2023-11-23 DIAGNOSIS — R636 Underweight: Secondary | ICD-10-CM

## 2023-11-23 DIAGNOSIS — R140 Abdominal distension (gaseous): Secondary | ICD-10-CM

## 2023-11-23 NOTE — Progress Notes (Signed)
 No egg or soy allergy known to patient  No issues known to pt with past sedation with any surgeries or procedures Patient denies ever being told they had issues or difficulty with intubation  No FH of Malignant Hyperthermia Pt is not on diet pills Pt is not on  home 02  Pt on blood thinner - Plavix  cleared for 5 day hold Pt denies issues with constipation  No A fib or A flutter Have any cardiac testing pending-- no  LOA: independent   Patient's chart reviewed by Rogena Class CNRA prior to previsit and patient appropriate for the [facility].  Previsit completed and red dot placed by patient's name on their procedure day (on provider's schedule).     PV completed with patient. Prep instructions sent via mychart and home address.

## 2023-11-27 ENCOUNTER — Ambulatory Visit: Admitting: Family Medicine

## 2023-11-27 ENCOUNTER — Encounter: Payer: Self-pay | Admitting: Family Medicine

## 2023-11-27 VITALS — BP 118/70 | HR 70 | Temp 98.1°F | Resp 16 | Ht 60.5 in | Wt 86.6 lb

## 2023-11-27 DIAGNOSIS — J069 Acute upper respiratory infection, unspecified: Secondary | ICD-10-CM

## 2023-11-27 DIAGNOSIS — E440 Moderate protein-calorie malnutrition: Secondary | ICD-10-CM | POA: Diagnosis not present

## 2023-11-27 DIAGNOSIS — J0140 Acute pansinusitis, unspecified: Secondary | ICD-10-CM

## 2023-11-27 MED ORDER — LORATADINE 10 MG PO TABS: 10.0000 mg | ORAL_TABLET | Freq: Every day | ORAL | 11 refills | Status: AC

## 2023-11-27 MED ORDER — DOXYCYCLINE HYCLATE 100 MG PO TABS: 100.0000 mg | ORAL_TABLET | Freq: Two times a day (BID) | ORAL | 0 refills | Status: DC

## 2023-11-27 NOTE — Progress Notes (Signed)
 Established Patient Office Visit  Subjective   Patient ID: Leah [name], female    DOB: 1945-10-16  Age: 78 y.o. MRN: [id number]  Chief Complaint  Patient presents with   Sinus Problem    X4 days, pt states having headache, sinus pressure, sore throat, no pain with swallowing, productive cough, left ear pain    HPI Discussed the use of AI scribe software for clinical note transcription with the patient, who gave verbal consent to proceed.  History of Present Illness Leah [name] is a 78 year old female who presents with four days of upper respiratory symptoms.  She has been experiencing headaches, sinus pressure, and a sore throat for the past four days. The nasal discharge is pale yellow and thick. No fever is present, but she notes a sore and red throat. She has been using saline nasal spray, Flonase , and albuterol  to manage her symptoms. There is a sensation of fullness in her ears without pain. She has not been around anyone sick and has not performed a COVID test at home.  She has a history of weight loss and is actively trying to gain weight. Her current weight is 86 pounds, consistent with her weight on April 25th, but an increase from 84 pounds on March 3rd. She uses a weight gain supplement, consuming eight ounces a day due to bloating issues when consuming the full recommended 16 ounces. She uses lactate milk to avoid gastrointestinal discomfort associated with regular milk.  Her current medications include albuterol , Flonase , hormone patches, and Repatha  for cholesterol management. She notes a significant increase in the cost of these medications, which is a concern for her.   Patient Active Problem List   Diagnosis Date Noted   Moderate protein-calorie malnutrition (HCC) 10/01/2023   Vitamin D  deficiency 09/03/2023   Weight loss, abnormal 03/24/2022   Acute cough 06/28/2021   Acute pain of right shoulder 09/07/2020   Cold intolerance 09/07/2020   SBO (small  bowel obstruction) (HCC) 09/19/2019   Current every day smoker 09/19/2019   Diverticulitis 09/19/2019   Partial small bowel obstruction (HCC) 09/04/2019   Preventative health care 08/27/2019   Family history of coronary artery disease 02/17/2019   Claudication in peripheral vascular disease (HCC) 10/14/2018   Dyspnea 09/03/2018   Calcification of native coronary artery 09/03/2018   PAD (peripheral artery disease) (HCC) 09/03/2018   History of esophageal stricture 06/19/2018   Rhinitis, chronic 04/23/2017   Small bowel obstruction due to adhesions (HCC) 04/08/2016   Frozen shoulder 04/27/2015   Hematoma 09/07/2014   Cervical spondylosis with myelopathy 08/18/2014   Cervical spondylosis with radiculopathy 08/18/2014   Neck pain 05/19/2014   Fatigue 05/08/2014   Back pain 05/08/2014   Pain in joint, upper arm 05/08/2014   LLQ abdominal pain 03/02/2014   History of diverticulitis 03/02/2014   COPD exacerbation (HCC) 01/28/2014   Oral thrush 01/28/2014   Influenza 10/27/2013   Appendicitis 09/19/2013   Abdominal pain, left lower quadrant 03/05/2013   Essential hypertension 02/07/2013   Sinusitis, acute 01/15/2013   COPD GOLD II 01/15/2013   Smoker 12/20/2012   Epigastric pain 09/03/2012   Left lower quadrant pain 01/24/2011   Leg pain, left 06/07/2009   ROSACEA 04/16/2009   B12 DEFICIENCY 04/09/2009   ANEMIA, IRON DEFICIENCY 03/19/2009   MALAISE AND FATIGUE 03/19/2009   NECK PAIN, LEFT 01/11/2009   URI 07/30/2008   Constipation 05/26/2008   NAUSEA AND VOMITING 05/26/2008   Abdominal pain 05/26/2008   Claudication (HCC)  04/27/2008   VOMITING 04/17/2008   Dysphagia 04/17/2008   ANXIETY DEPRESSION 03/12/2008   TREMOR 03/12/2008   Hyperlipidemia 11/13/2007   DEPRESSION 11/13/2007   ESOPHAGEAL SPASM 11/13/2007   GERD (gastroesophageal reflux disease) 11/13/2007   IBS 11/13/2007   CARPAL TUNNEL SYNDROME, HX OF 11/13/2007   Diverticulosis of colon (without mention of  hemorrhage) 03/14/2007   GASTRITIS, CHRONIC 04/10/2002   HIATAL HERNIA 04/10/2002   Past Medical History:  Diagnosis Date   Allergy    ANEMIA, IRON DEFICIENCY 03/19/2009   Qualifier: Diagnosis of   By: Tracy Friedlander         Anxiety    Barrett esophagus    Bladder pain    Cataract    Chronic bronchitis (HCC)    per pt last episode july 2016   Complication of anesthesia    slow to wake   COPD (chronic obstructive pulmonary disease) (HCC)    Depression    Diverticulosis of colon (without mention of hemorrhage) 03/14/2007   Centricity Description: DIVERTICULOSIS, COLON  Qualifier: Diagnosis of   By: Lewellyn CMA (AAMA), Amanda      Centricity Description: DIVERTICULOSIS-COLON  Qualifier: Diagnosis of   By: Shelva Dice, Amy S     Replacing diagnoses that were inactivated after the 10/30/22 regulatory import     Exocrine pancreatic insufficiency    Frequency of urination    GERD (gastroesophageal reflux disease)    Hiatal hernia    History of adenomatous polyp of colon    tubular adenoma   History of chronic gastritis    History of diverticulitis 03/02/2014   History of diverticulitis of colon    History of esophageal dilatation    for stricture   History of esophageal spasm    Hyperlipidemia    Hypertension    IBS 11/13/2007   Qualifier: Diagnosis of   By: Lewellyn CMA (AAMA), Amanda         IBS (irritable bowel syndrome)    Interstitial cystitis    PAD (peripheral artery disease) (HCC)    Partial small bowel obstruction (HCC) 09/04/2019   Productive cough    intermittant   SBO (small bowel obstruction) (HCC) 09/19/2019   Smokers' cough (HCC)    Urgency of urination    Past Surgical History:  Procedure Laterality Date   ABDOMINAL AORTOGRAM N/A 10/14/2018   Procedure: ABDOMINAL AORTOGRAM;  Surgeon: Avanell Leigh, MD;  Location: [facility];  Service: Cardiovascular;  Laterality: N/A;   ABDOMINAL HYSTERECTOMY  1975   ANTERIOR CERVICAL DECOMP/DISCECTOMY  FUSION N/A 09/07/2014   Procedure: Exploration of Fusion and Removal of Anterior Cervical Hematoma;  Surgeon: Garry Kansas, MD;  Location: [facility];  Service: Neurosurgery;  Laterality: N/A;   CARPAL TUNNEL RELEASE Right 1993   COLONOSCOPY  last one 04-30-2013   COLONOSCOPY WITH PROPOFOL  N/A 09/09/2019   Procedure: COLONOSCOPY WITH PROPOFOL ;  Surgeon: Albertina Hugger, MD;  Location: [facility];  Service: Gastroenterology;  Laterality: N/A;   CYSTO WITH HYDRODISTENSION N/A 06/08/2015   Procedure: CYSTOSCOPY/HYDRODISTENSION INSTILLATION OF MARCAINE  AND PYRIDIUM ;  Surgeon: Homero Luster, MD;  Location: [facility];  Service: Urology;  Laterality: N/A;   CYSTO/  HOD/  BLADDER BX  1990's   ESOPHAGOGASTRODUODENOSCOPY  last one 09-11-2012   LAPAROSCOPIC APPENDECTOMY N/A 09/21/2013   Procedure: APPENDECTOMY LAPAROSCOPIC;  Surgeon: Enid Harry, MD;  Location: [facility] OR;  Service: General;  Laterality: N/A;   LAPAROSCOPIC CHOLECYSTECTOMY  2002   LAPAROSCOPY SIGMOID COLECTOMY  10-05-2008  diverticulitis   LOWER EXTREMITY ANGIOGRAPHY Left 10/14/2018   LOWER EXTREMITY ANGIOGRAPHY Bilateral 10/14/2018   Procedure: Lower Extremity Angiography;  Surgeon: Avanell Leigh, MD;  Location: [facility] INVASIVE CV LAB;  Service: Cardiovascular;  Laterality: Bilateral;  ILIACS   MICROLARYNGOSCOPY  09-27-2006   w/ true vocal cord stripping  and bilateral bx's of lesion's (benign)   PERIPHERAL VASCULAR INTERVENTION  10/14/2018   Procedure: PERIPHERAL VASCULAR INTERVENTION;  Surgeon: Avanell Leigh, MD;  Location: [facility];  Service: Cardiovascular;;   SINUS SURGERY WITH INSTATRAK  1991   TEMPOROMANDIBULAR JOINT SURGERY  1990   TONSILLECTOMY AND ADENOIDECTOMY  1964   Social History   Tobacco Use   Smoking status: Every Day    Current packs/day: 0.50    Average packs/day: 0.7 packs/day for 48.3 years (35.9 ttl pk-yrs)    Types: Cigarettes    Start date: 2024   Smokeless tobacco:  Never   Tobacco comments:    counseled about smoking cessation  Vaping Use   Vaping status: Never Used  Substance Use Topics   Alcohol use: No    Alcohol/week: 0.0 standard drinks of alcohol   Drug use: No   Social History   Socioeconomic History   Marital status: Married    Spouse name: Not on file   Number of children: 3   Years of education: Not on file   Highest education level: Not on file  Occupational History   Not on file  Tobacco Use   Smoking status: Every Day    Current packs/day: 0.50    Average packs/day: 0.7 packs/day for 48.3 years (35.9 ttl pk-yrs)    Types: Cigarettes    Start date: 2024   Smokeless tobacco: Never   Tobacco comments:    counseled about smoking cessation  Vaping Use   Vaping status: Never Used  Substance and Sexual Activity   Alcohol use: No    Alcohol/week: 0.0 standard drinks of alcohol   Drug use: No   Sexual activity: Not Currently    Birth control/protection: Post-menopausal  Other Topics Concern   Not on file  Social History Narrative   Not on file   Social Drivers of Health   Financial Resource Strain: Not on file  Food Insecurity: Not on file  Transportation Needs: Not on file  Physical Activity: Not on file  Stress: Not on file  Social Connections: Not on file  Intimate Partner Violence: Not on file   Family Status  Relation Name Status   Mother  Deceased   Father  Deceased   Sister  Alive   Brother  Alive   MGM  Deceased   MGF  Deceased   PGM  Deceased   PGF  Deceased   Daughter  Alive   Daughter  Alive   Son  Alive   Neg Hx  (Not Specified)  No partnership data on file   Family History  Problem Relation Age of Onset   Pancreatic cancer Mother    Colon polyps Mother    Pancreatic cancer Father    Diabetes Father    Heart disease Father    Leukemia Father    Prostate cancer Father    Hypertension Father    Heart disease Brother    Heart attack Brother    Other Daughter        prediabetes    Heart disease Daughter    Diabetes Daughter    Colon cancer Neg Hx    Esophageal cancer  Neg Hx    Rectal cancer Neg Hx    Stomach cancer Neg Hx    Allergies  Allergen Reactions   Amoxicillin-Pot Clavulanate Shortness Of Breath, Itching and Swelling   Avelox [Moxifloxacin Hcl In Nacl] Swelling    Swelling, itching and shortness of breath   Ciprofloxacin Shortness Of Breath, Itching and Swelling   Keflex  [Cephalexin ] Shortness Of Breath and Swelling   Penicillins Shortness Of Breath, Itching and Swelling    Did it involve swelling of the face/tongue/throat, SOB, or low BP? Yes Did it involve sudden or severe rash/hives, skin peeling, or any reaction on the inside of your mouth or nose? No Did you need to seek medical attention at a hospital or doctor's office? Yes When did it last happen?      Several years If all above answers are "NO", may proceed with cephalosporin use.    Sulfa Antibiotics Shortness Of Breath, Itching and Swelling   Prednisone  Other (See Comments)    GI irritation; is able to tolerate injections   Acyclovir And Related Other (See Comments)    Pt does not recall this reaction   Clindamycin /Lincomycin Itching and Swelling   Levaquin  [Levofloxacin ] Hives   Metronidazole  Other (See Comments)   Moxifloxacin Hcl Other (See Comments)   Tizanidine  Other (See Comments)   Tramadol  Other (See Comments)   Zyvox  [Linezolid ] Diarrhea   Hydrocodone  Rash      Review of Systems  Constitutional:  Negative for fever and malaise/fatigue.  HENT:  Positive for congestion, sinus pain and sore throat.   Eyes:  Negative for blurred vision.  Respiratory:  Positive for cough. Negative for shortness of breath and wheezing.   Cardiovascular:  Negative for chest pain, palpitations and leg swelling.  Gastrointestinal:  Negative for abdominal pain, blood in stool, nausea and vomiting.  Genitourinary:  Negative for dysuria and frequency.  Musculoskeletal:  Negative for back pain and  falls.  Skin:  Negative for rash.  Neurological:  Negative for dizziness, loss of consciousness and headaches.  Endo/Heme/Allergies:  Negative for environmental allergies.  Psychiatric/Behavioral:  Negative for depression. The patient is not nervous/anxious.       Objective:     BP 118/70 (BP Location: Right Arm, Patient Position: Sitting, Cuff Size: Small)   Pulse 70   Temp 98.1 F (36.7 C) (Oral)   Resp 16   Ht 5' 0.5" (1.537 m)   Wt 86 lb 9.6 oz (39.3 kg)   SpO2 96%   BMI 16.63 kg/m  BP Readings from Last 3 Encounters:  11/27/23 118/70  10/01/23 118/78  09/24/23 (!) 133/97   Wt Readings from Last 3 Encounters:  11/27/23 86 lb 9.6 oz (39.3 kg)  11/23/23 86 lb (39 kg)  10/01/23 84 lb 12.8 oz (38.5 kg)   SpO2 Readings from Last 3 Encounters:  11/27/23 96%  10/01/23 100%  09/24/23 98%      Physical Exam Vitals and nursing note reviewed.  Constitutional:      General: She is not in acute distress.    Appearance: Normal appearance.  HENT:     Head: Normocephalic and atraumatic.     Right Ear: Tympanic membrane, ear canal and external ear normal. There is no impacted cerumen.     Left Ear: Tympanic membrane, ear canal and external ear normal. There is no impacted cerumen.     Nose: No congestion or rhinorrhea.     Right Sinus: Maxillary sinus tenderness and frontal sinus tenderness present.  Left Sinus: Maxillary sinus tenderness and frontal sinus tenderness present.     Mouth/Throat:     Mouth: Mucous membranes are moist.     Pharynx: Oropharynx is clear. No oropharyngeal exudate or posterior oropharyngeal erythema.  Eyes:     General: No scleral icterus.       Right eye: No discharge.        Left eye: No discharge.     Conjunctiva/sclera: Conjunctivae normal.  Cardiovascular:     Rate and Rhythm: Normal rate and regular rhythm.     Heart sounds: Normal heart sounds.  Pulmonary:     Effort: Pulmonary effort is normal. No respiratory distress.     Breath  sounds: Normal breath sounds.  Musculoskeletal:     Cervical back: Normal range of motion.  Lymphadenopathy:     Cervical: No cervical adenopathy.  Skin:    General: Skin is warm and dry.  Neurological:     Mental Status: She is alert and oriented to person, place, and time.  Psychiatric:        Mood and Affect: Mood normal.        Behavior: Behavior normal.        Thought Content: Thought content normal.        Judgment: Judgment normal.     No results found for any visits on 11/27/23.  Last CBC Lab Results  Component Value Date   WBC 6.4 11/01/2023   HGB 13.9 11/01/2023   HCT 41.4 11/01/2023   MCV 93.5 11/01/2023   MCH 30.6 07/02/2023   RDW 14.0 11/01/2023   PLT 213.0 11/01/2023   Last metabolic panel Lab Results  Component Value Date   GLUCOSE 106 (H) 11/01/2023   NA 140 11/01/2023   K 4.4 11/01/2023   CL 102 11/01/2023   CO2 31 11/01/2023   BUN 11 11/01/2023   CREATININE 0.84 11/01/2023   GFR 67.98 11/01/2023   CALCIUM  9.3 11/01/2023   PROT 6.7 11/01/2023   ALBUMIN 4.3 11/01/2023   BILITOT 0.4 11/01/2023   ALKPHOS 67 11/01/2023   AST 18 11/01/2023   ALT 14 11/01/2023   ANIONGAP 5 09/08/2019   Last lipids Lab Results  Component Value Date   CHOL 142 09/03/2023   HDL 46.90 09/03/2023   LDLCALC 75 09/03/2023   LDLDIRECT 91.0 01/10/2022   TRIG 105.0 09/03/2023   CHOLHDL 3 09/03/2023   Last hemoglobin A1c Lab Results  Component Value Date   HGBA1C 6.2 07/05/2023   Last thyroid  functions Lab Results  Component Value Date   TSH 1.44 09/03/2023   Last vitamin D  Lab Results  Component Value Date   VD25OH 13.38 (L) 09/03/2023   Last vitamin B12 and Folate Lab Results  Component Value Date   VITAMINB12 348 03/06/2023   FOLATE 10.9 03/19/2009      The 10-year ASCVD risk score (Arnett DK, et al., 2019) is: 24.7%    Assessment & Plan:   Problem List Items Addressed This Visit       Unprioritized   Sinusitis, acute - Primary   Relevant  Medications   loratadine  (CLARITIN ) 10 MG tablet   doxycycline  (VIBRA -TABS) 100 MG tablet   Moderate protein-calorie malnutrition (HCC)   Relevant Orders   Amb ref to Medical Nutrition Therapy-MNT   Other Visit Diagnoses       Upper respiratory tract infection, unspecified type       Relevant Medications   loratadine  (CLARITIN ) 10 MG tablet     Assessment and  Plan Assessment & Plan Acute sinusitis   She experiences headaches, sinus pressure, and a sore throat for four days, with no fever. Nasal discharge is pale yellow and thick. Lungs are clear on examination. Although no recent COVID test was done, COVID remains present in the community. Prescribe doxycycline  and an antihistamine to use with Flonase .  Allergic rhinitis   This chronic condition is managed with Flonase  and saline nasal spray. Symptoms include nasal congestion and sinus pressure. She is not currently using antihistamines. Continue Flonase  and saline nasal spray, and prescribe an antihistamine to use with Flonase .  Weight loss   Her weight is currently 86 pounds, up from 84 pounds on March 3rd. She reports difficulty gaining weight despite increased caloric intake. A weight gain supplement is used, but she experiences bloating with the recommended amount. Lactose intolerance may contribute to bloating when using lactate milk. Consider using almond milk with the weight gain supplement to reduce bloating.  Lactose intolerance   She experiences bloating with regular milk and uses lactate milk to mitigate symptoms. Bloating occurs with the weight gain supplement mixed with lactate milk. Consider using almond milk with the weight gain supplement to reduce bloating.    No follow-ups on file.    Shelbylynn Walczyk R Lowne Chase, DO

## 2023-11-29 ENCOUNTER — Encounter: Admitting: Gastroenterology

## 2023-11-30 ENCOUNTER — Other Ambulatory Visit: Payer: Self-pay | Admitting: Internal Medicine

## 2023-11-30 DIAGNOSIS — I2510 Atherosclerotic heart disease of native coronary artery without angina pectoris: Secondary | ICD-10-CM

## 2023-11-30 DIAGNOSIS — E785 Hyperlipidemia, unspecified: Secondary | ICD-10-CM

## 2023-11-30 DIAGNOSIS — I739 Peripheral vascular disease, unspecified: Secondary | ICD-10-CM

## 2023-12-01 NOTE — Patient Instructions (Signed)

## 2023-12-11 ENCOUNTER — Ambulatory Visit (HOSPITAL_BASED_OUTPATIENT_CLINIC_OR_DEPARTMENT_OTHER)
Admission: RE | Admit: 2023-12-11 | Discharge: 2023-12-11 | Disposition: A | Discharge status: Home / Self Care | Source: Ambulatory Visit | Attending: Acute Care | Admitting: Acute Care

## 2023-12-11 DIAGNOSIS — F17210 Nicotine dependence, cigarettes, uncomplicated: Secondary | ICD-10-CM | POA: Insufficient documentation

## 2023-12-11 DIAGNOSIS — Z122 Encounter for screening for malignant neoplasm of respiratory organs: Secondary | ICD-10-CM | POA: Diagnosis not present

## 2023-12-11 DIAGNOSIS — Z87891 Personal history of nicotine dependence: Secondary | ICD-10-CM | POA: Insufficient documentation

## 2023-12-23 ENCOUNTER — Other Ambulatory Visit: Payer: Self-pay | Admitting: Family Medicine

## 2023-12-23 DIAGNOSIS — I10 Essential (primary) hypertension: Secondary | ICD-10-CM

## 2024-01-01 ENCOUNTER — Other Ambulatory Visit: Payer: Self-pay

## 2024-01-01 DIAGNOSIS — E7849 Other hyperlipidemia: Secondary | ICD-10-CM

## 2024-01-01 NOTE — Progress Notes (Signed)
nmr  

## 2024-01-03 ENCOUNTER — Telehealth: Payer: Self-pay | Admitting: *Deleted

## 2024-01-03 DIAGNOSIS — R911 Solitary pulmonary nodule: Secondary | ICD-10-CM

## 2024-01-03 DIAGNOSIS — Z87891 Personal history of nicotine dependence: Secondary | ICD-10-CM

## 2024-01-03 NOTE — Telephone Encounter (Signed)
 Call report from [facility] Radiology: MPRESSION: Lung-RADS 3, probably benign findings. Short-term follow-up in 6 months is recommended with repeat low-dose chest CT without contrast (please use the following order, CT CHEST LCS NODULE FOLLOW-UP W/O CM). New part solid 0.7 cm posterior right lower lobe nodule with tiny 0.2 cm solid component.   Two vessel coronary atherosclerosis.   Aortic Atherosclerosis (ICD10-I70.0) and Emphysema (ICD10-J43.9)  Called and spoke with patient regarding lung screening CT results. Previous nodules have decreased in size. One small new nodule seen that we would like to look at again in 6 months with a repeat CT. Pt verbalized understanding and is aware we will call her closer to that time to schedule. Results/ plans faxed to PCP.

## 2024-01-14 DIAGNOSIS — F325 Major depressive disorder, single episode, in full remission: Secondary | ICD-10-CM | POA: Diagnosis not present

## 2024-01-23 ENCOUNTER — Other Ambulatory Visit: Payer: Self-pay | Admitting: Family Medicine

## 2024-01-23 DIAGNOSIS — R1013 Epigastric pain: Secondary | ICD-10-CM

## 2024-01-23 DIAGNOSIS — E785 Hyperlipidemia, unspecified: Secondary | ICD-10-CM

## 2024-01-24 ENCOUNTER — Ambulatory Visit: Admitting: Gastroenterology

## 2024-01-24 ENCOUNTER — Encounter: Payer: Self-pay | Admitting: Gastroenterology

## 2024-01-24 VITALS — BP 110/54 | HR 70 | Temp 97.6°F | Resp 22 | Ht 60.5 in | Wt 86.0 lb

## 2024-01-24 DIAGNOSIS — I10 Essential (primary) hypertension: Secondary | ICD-10-CM | POA: Diagnosis not present

## 2024-01-24 DIAGNOSIS — R933 Abnormal findings on diagnostic imaging of other parts of digestive tract: Secondary | ICD-10-CM

## 2024-01-24 DIAGNOSIS — K2950 Unspecified chronic gastritis without bleeding: Secondary | ICD-10-CM

## 2024-01-24 DIAGNOSIS — K319 Disease of stomach and duodenum, unspecified: Secondary | ICD-10-CM | POA: Diagnosis not present

## 2024-01-24 DIAGNOSIS — K317 Polyp of stomach and duodenum: Secondary | ICD-10-CM

## 2024-01-24 DIAGNOSIS — K222 Esophageal obstruction: Secondary | ICD-10-CM | POA: Diagnosis not present

## 2024-01-24 DIAGNOSIS — D132 Benign neoplasm of duodenum: Secondary | ICD-10-CM

## 2024-01-24 DIAGNOSIS — F419 Anxiety disorder, unspecified: Secondary | ICD-10-CM | POA: Diagnosis not present

## 2024-01-24 DIAGNOSIS — J449 Chronic obstructive pulmonary disease, unspecified: Secondary | ICD-10-CM | POA: Diagnosis not present

## 2024-01-24 DIAGNOSIS — F32A Depression, unspecified: Secondary | ICD-10-CM | POA: Diagnosis not present

## 2024-01-24 DIAGNOSIS — R634 Abnormal weight loss: Secondary | ICD-10-CM

## 2024-01-24 MED ORDER — SODIUM CHLORIDE 0.9 % IV SOLN: 500.0000 mL | Freq: Once | INTRAVENOUS | Status: AC

## 2024-01-24 MED ORDER — ONDANSETRON 4 MG PO TBDP: 4.0000 mg | ORAL_TABLET | Freq: Four times a day (QID) | ORAL | 0 refills | Status: AC | PRN

## 2024-01-24 NOTE — Progress Notes (Signed)
 [facility] History and Physical   Primary Care Physician:  Antonio Meth, Jamee SAUNDERS, DO   Reason for Procedure:   Weight loss, abnormal CT Scan  Plan:    EGD     HPI: Leah [name] is a 78 y.o. female  here for EGD to evaluate weight loss - abnormal CT Scan showing gastric thickening. On pepcid  and protonix . Also takes aspirin  and Plavix .Plavix  held for 5 days. Weight stable since I have last seen her. She has nausea that bothers her. Seldom dysphagia.    Otherwise feels well without any cardiopulmonary symptoms.   I have discussed risks / benefits of anesthesia and endoscopic procedure with Reni G Proch and they wish to proceed with the exams as outlined today.    Past Medical History:  Diagnosis Date   Allergy    ANEMIA, IRON DEFICIENCY 03/19/2009   Qualifier: Diagnosis of   By: Antonio ROSALEA Jamee         Anxiety    Barrett esophagus    Bladder pain    Cataract    Chronic bronchitis (HCC)    per pt last episode july 2016   Complication of anesthesia    slow to wake   COPD (chronic obstructive pulmonary disease) (HCC)    Depression    Diverticulosis of colon (without mention of hemorrhage) 03/14/2007   Centricity Description: DIVERTICULOSIS, COLON  Qualifier: Diagnosis of   By: Lewellyn CMA (AAMA), Amanda      Centricity Description: DIVERTICULOSIS-COLON  Qualifier: Diagnosis of   By: Ever Riggers, Amy S     Replacing diagnoses that were inactivated after the 10/30/22 regulatory import     Exocrine pancreatic insufficiency    Frequency of urination    GERD (gastroesophageal reflux disease)    Hiatal hernia    History of adenomatous polyp of colon    tubular adenoma   History of chronic gastritis    History of diverticulitis 03/02/2014   History of diverticulitis of colon    History of esophageal dilatation    for stricture   History of esophageal spasm    Hyperlipidemia    Hypertension    IBS 11/13/2007   Qualifier: Diagnosis of   By: Lewellyn  CMA (AAMA), Amanda         IBS (irritable bowel syndrome)    Interstitial cystitis    PAD (peripheral artery disease) (HCC)    Partial small bowel obstruction (HCC) 09/04/2019   Productive cough    intermittant   SBO (small bowel obstruction) (HCC) 09/19/2019   Smokers' cough (HCC)    Urgency of urination     Past Surgical History:  Procedure Laterality Date   ABDOMINAL AORTOGRAM N/A 10/14/2018   Procedure: ABDOMINAL AORTOGRAM;  Surgeon: Court Dorn PARAS, MD;  Location: [facility];  Service: Cardiovascular;  Laterality: N/A;   ABDOMINAL HYSTERECTOMY  1975   ANTERIOR CERVICAL DECOMP/DISCECTOMY FUSION N/A 09/07/2014   Procedure: Exploration of Fusion and Removal of Anterior Cervical Hematoma;  Surgeon: Reyes Budge, MD;  Location: [facility]URO [facility];  Service: Neurosurgery;  Laterality: N/A;   CARPAL TUNNEL RELEASE Right 1993   COLONOSCOPY  last one 04-30-2013   COLONOSCOPY WITH PROPOFOL  N/A 09/09/2019   Procedure: COLONOSCOPY WITH PROPOFOL ;  Surgeon: Legrand Victory LITTIE DOUGLAS, MD;  Location: [facility];  Service: Gastroenterology;  Laterality: N/A;   CYSTO WITH HYDRODISTENSION N/A 06/08/2015   Procedure: CYSTOSCOPY/HYDRODISTENSION INSTILLATION OF MARCAINE  AND PYRIDIUM ;  Surgeon: Norleen Seltzer, MD;  Location: [facility];  Service:  Urology;  Laterality: N/A;   CYSTO/  HOD/  BLADDER BX  1990's   ESOPHAGOGASTRODUODENOSCOPY  last one 09-11-2012   LAPAROSCOPIC APPENDECTOMY N/A 09/21/2013   Procedure: APPENDECTOMY LAPAROSCOPIC;  Surgeon: Donnice Bury, MD;  Location: [facility] OR;  Service: General;  Laterality: N/A;   LAPAROSCOPIC CHOLECYSTECTOMY  2002   LAPAROSCOPY SIGMOID COLECTOMY  10-05-2008   diverticulitis   LOWER EXTREMITY ANGIOGRAPHY Left 10/14/2018   LOWER EXTREMITY ANGIOGRAPHY Bilateral 10/14/2018   Procedure: Lower Extremity Angiography;  Surgeon: Court Dorn PARAS, MD;  Location: [facility] INVASIVE CV LAB;  Service: Cardiovascular;  Laterality: Bilateral;  ILIACS    MICROLARYNGOSCOPY  09-27-2006   w/ true vocal cord stripping  and bilateral bx's of lesion's (benign)   PERIPHERAL VASCULAR INTERVENTION  10/14/2018   Procedure: PERIPHERAL VASCULAR INTERVENTION;  Surgeon: Court Dorn PARAS, MD;  Location: [facility];  Service: Cardiovascular;;   SINUS SURGERY WITH INSTATRAK  1991   TEMPOROMANDIBULAR JOINT SURGERY  1990   TONSILLECTOMY AND ADENOIDECTOMY  1964    Prior to Admission medications   Medication Sig Start Date End Date Taking? Authorizing Provider  dicyclomine  (BENTYL ) 10 MG capsule TAKE 1 CAPSULE BY MOUTH AS NEEDED FOR UP TO 1 DOSE FOR SPASMS. 06/12/22  Yes Danis, Victory LITTIE MOULD, MD  estradiol  (CLIMARA  - DOSED IN MG/24 HR) 0.1 mg/24hr patch Place 0.1 mg onto the skin every Monday. 02/08/16  Yes [provider]  ezetimibe  (ZETIA ) 10 MG tablet Take 1 tablet (10 mg total) by mouth daily. 01/23/24  Yes Antonio Cyndee [name], DO  famotidine  (PEPCID ) 20 MG tablet Take 1 tablet (20 mg total) by mouth 2 (two) times daily. 01/23/24  Yes Antonio Cyndee Jamee R, DO  lisinopril  (ZESTRIL ) 20 MG tablet TAKE 1 TABLET EVERY DAY 12/25/23  Yes Lowne Chase, Yvonne R, DO  LORazepam  (ATIVAN ) 0.5 MG tablet Take 0.5 mg by mouth at bedtime.  12/04/14  Yes [provider]  pantoprazole  (PROTONIX ) 40 MG tablet TAKE 1 TABLET BY MOUTH EVERY DAY 04/09/23  Yes Lowne Chase, Yvonne R, DO  polyethylene glycol (MIRALAX ) 17 g packet Take 17 g by mouth 2 (two) times daily. 09/09/19  Yes Briana Elgin LABOR, MD  sertraline  (ZOLOFT ) 50 MG tablet Take 25 mg by mouth at bedtime.    Yes [provider]  Vitamin D , Ergocalciferol , (DRISDOL ) 1.25 MG (50000 UNIT) CAPS capsule Take 1 capsule (50,000 Units total) by mouth every 7 (seven) days. 09/03/23  Yes Antonio Cyndee Jamee R, DO  albuterol  (VENTOLIN  HFA) 108 (90 Base) MCG/ACT inhaler TAKE 2 PUFFS BY MOUTH EVERY 6 HOURS AS NEEDED 06/08/23 07/08/23  Saguier, [place], PA-C  aspirin  EC 81 MG EC tablet Take 1 tablet (81 mg total) by  mouth daily. 10/15/18   Henry Manuelita NOVAK, NP  atorvastatin  (LIPITOR ) 40 MG tablet TAKE 1 TABLET EVERY DAY (NEED MD APPOINTMENT FOR REFILLS) Patient not taking: Reported on 01/24/2024 04/20/23   Court Dorn PARAS, MD  clopidogrel  (PLAVIX ) 75 MG tablet TAKE 1 TABLET EVERY DAY 08/15/23   Hilty, Vinie BROCKS, MD  Digestive Enzymes (DIGESTIVE ENZYME PO) Take by mouth daily. Patient taking after each meal Patient not taking: Reported on 01/24/2024    [provider]  doxycycline  (VIBRA -TABS) 100 MG tablet Take 1 tablet (100 mg total) by mouth 2 (two) times daily. 11/27/23   Antonio Cyndee Jamee R, DO  Evolocumab  (REPATHA  SURECLICK) 140 MG/ML SOAJ INJECT 1 DOSE INTO THE SKIN EVERY 14 (FOURTEEN) DAYS. 12/03/23   Hilty, Vinie BROCKS, MD  fluticasone  (FLONASE ) 50 MCG/ACT nasal spray Place 2 sprays into both nostrils daily. Patient not taking: Reported on 11/23/2023 06/28/21   Antonio Cyndee Rockers R, DO  loratadine  (CLARITIN ) 10 MG tablet Take 1 tablet (10 mg total) by mouth daily. 11/27/23   Antonio Cyndee Rockers [name], DO  methocarbamol  (ROBAXIN ) 500 MG tablet Take 1 tablet (500 mg total) by mouth every 8 (eight) hours as needed for muscle spasms. Patient not taking: Reported on 11/23/2023 09/03/23   Antonio Cyndee, Rockers R, DO  ondansetron  (ZOFRAN ) 4 MG tablet TAKE 1 TABLET (4 MG TOTAL) BY MOUTH EVERY 6 (SIX) HOURS AS NEEDED FOR NAUSEA OR VOMITING Patient not taking: Reported on 11/23/2023 06/12/22   Legrand Victory LITTIE DOUGLAS, MD  sucralfate  (CARAFATE ) 1 g tablet Take 1 tablet by mouth 3 (three) times daily. Patient not taking: Reported on 01/24/2024 12/03/23   [provider]    Current Outpatient Medications  Medication Sig Dispense Refill   dicyclomine  (BENTYL ) 10 MG capsule TAKE 1 CAPSULE BY MOUTH AS NEEDED FOR UP TO 1 DOSE FOR SPASMS. 90 capsule 0   estradiol  (CLIMARA  - DOSED IN MG/24 HR) 0.1 mg/24hr patch Place 0.1 mg onto the skin every Monday.  0   ezetimibe  (ZETIA ) 10 MG tablet Take 1 tablet (10 mg total) by  mouth daily. 90 tablet 0   famotidine  (PEPCID ) 20 MG tablet Take 1 tablet (20 mg total) by mouth 2 (two) times daily. 180 tablet 0   lisinopril  (ZESTRIL ) 20 MG tablet TAKE 1 TABLET EVERY DAY 90 tablet 1   LORazepam  (ATIVAN ) 0.5 MG tablet Take 0.5 mg by mouth at bedtime.   5   pantoprazole  (PROTONIX ) 40 MG tablet TAKE 1 TABLET BY MOUTH EVERY DAY 90 tablet 1   polyethylene glycol (MIRALAX ) 17 g packet Take 17 g by mouth 2 (two) times daily. 30 each 0   sertraline  (ZOLOFT ) 50 MG tablet Take 25 mg by mouth at bedtime.      Vitamin D , Ergocalciferol , (DRISDOL ) 1.25 MG (50000 UNIT) CAPS capsule Take 1 capsule (50,000 Units total) by mouth every 7 (seven) days. 12 capsule 1   albuterol  (VENTOLIN  HFA) 108 (90 Base) MCG/ACT inhaler TAKE 2 PUFFS BY MOUTH EVERY 6 HOURS AS NEEDED 6.7 each 2   aspirin  EC 81 MG EC tablet Take 1 tablet (81 mg total) by mouth daily.     atorvastatin  (LIPITOR ) 40 MG tablet TAKE 1 TABLET EVERY DAY (NEED MD APPOINTMENT FOR REFILLS) (Patient not taking: Reported on 01/24/2024) 90 tablet 3   clopidogrel  (PLAVIX ) 75 MG tablet TAKE 1 TABLET EVERY DAY 90 tablet 2   Digestive Enzymes (DIGESTIVE ENZYME PO) Take by mouth daily. Patient taking after each meal (Patient not taking: Reported on 01/24/2024)     doxycycline  (VIBRA -TABS) 100 MG tablet Take 1 tablet (100 mg total) by mouth 2 (two) times daily. 20 tablet 0   Evolocumab  (REPATHA  SURECLICK) 140 MG/ML SOAJ INJECT 1 DOSE INTO THE SKIN EVERY 14 (FOURTEEN) DAYS. 6 mL 3   fluticasone  (FLONASE ) 50 MCG/ACT nasal spray Place 2 sprays into both nostrils daily. (Patient not taking: Reported on 11/23/2023) 16 g 6   loratadine  (CLARITIN ) 10 MG tablet Take 1 tablet (10 mg total) by mouth daily. 30 tablet 11   methocarbamol  (ROBAXIN ) 500 MG tablet Take 1 tablet (500 mg total) by mouth every 8 (eight) hours as needed for muscle spasms. (Patient not taking: Reported on 11/23/2023) 20 tablet 0   ondansetron  (ZOFRAN ) 4 MG tablet TAKE 1 TABLET (  4 MG TOTAL)  BY MOUTH EVERY 6 (SIX) HOURS AS NEEDED FOR NAUSEA OR VOMITING (Patient not taking: Reported on 11/23/2023) 45 tablet 2   sucralfate  (CARAFATE ) 1 g tablet Take 1 tablet by mouth 3 (three) times daily. (Patient not taking: Reported on 01/24/2024)     Current Facility-Administered Medications  Medication Dose Route Frequency Provider Last Rate Last Admin   0.9 %  sodium chloride  infusion  500 mL Intravenous Once Lilia Letterman, Elspeth SQUIBB, MD        Allergies as of 01/24/2024 - Review Complete 01/24/2024  Allergen Reaction Noted   Amoxicillin-pot clavulanate Shortness Of Breath, Itching, and Swelling 09/19/2013   Avelox [moxifloxacin hcl in nacl] Swelling 06/08/2015   Ciprofloxacin Shortness Of Breath, Itching, and Swelling 03/12/2008   Keflex  [cephalexin ] Shortness Of Breath and Swelling 09/08/2014   Penicillins Shortness Of Breath, Itching, and Swelling 03/12/2008   Sulfa antibiotics Shortness Of Breath, Itching, and Swelling 06/04/2015   Clindamycin /lincomycin Itching and Swelling 03/29/2018   Levaquin  [levofloxacin ] Hives 10/14/2018   Metronidazole  Other (See Comments) 11/23/2023   Prednisone  Other (See Comments) 01/15/2013   Tizanidine  Other (See Comments) 11/23/2023   Tramadol  Itching 11/23/2023   Acyclovir and related Other (See Comments) 04/13/2015   Hydrocodone  Rash 06/05/2023   Zyvox  [linezolid ] Diarrhea 04/02/2018    Family History  Problem Relation Age of Onset   Pancreatic cancer Mother    Colon polyps Mother    Pancreatic cancer Father    Diabetes Father    Heart disease Father    Leukemia Father    Prostate cancer Father    Hypertension Father    Heart disease Brother    Heart attack Brother    Other Daughter        prediabetes   Heart disease Daughter    Diabetes Daughter    Colon cancer Neg Hx    Esophageal cancer Neg Hx    Rectal cancer Neg Hx    Stomach cancer Neg Hx     Social History   Socioeconomic History   Marital status: Married    Spouse name: Not  on file   Number of children: 3   Years of education: Not on file   Highest education level: Not on file  Occupational History   Not on file  Tobacco Use   Smoking status: Every Day    Current packs/day: 0.50    Average packs/day: 0.7 packs/day for 48.5 years (36.0 ttl pk-yrs)    Types: Cigarettes    Start date: 2024   Smokeless tobacco: Never   Tobacco comments:    counseled about smoking cessation  Vaping Use   Vaping status: Never Used  Substance and Sexual Activity   Alcohol use: No    Alcohol/week: 0.0 standard drinks of alcohol   Drug use: No   Sexual activity: Not Currently    Birth control/protection: Post-menopausal  Other Topics Concern   Not on file  Social History Narrative   Not on file   Social Drivers of Health   Financial Resource Strain: Not on file  Food Insecurity: Not on file  Transportation Needs: Not on file  Physical Activity: Not on file  Stress: Not on file  Social Connections: Not on file  Intimate Partner Violence: Not on file    Review of Systems: All other review of systems negative except as mentioned in the HPI.  Physical Exam: Vital signs BP (!) 150/74   Pulse 67   Temp 97.6 F (36.4 C) (Temporal)  Ht 5' 0.5 (1.537 m)   Wt 86 lb (39 kg)   SpO2 96%   BMI 16.52 kg/m   General:   Alert,  Well-developed, pleasant and cooperative in NAD Lungs:  Clear throughout to auscultation.   Heart:  Regular rate and rhythm Abdomen:  Soft, nontender and nondistended.   Neuro/Psych:  Alert and cooperative. Normal mood and affect. A and O x 3  Marcey Naval, MD [facility]

## 2024-01-24 NOTE — Progress Notes (Signed)
 Called to room to assist during endoscopic procedure.  Patient ID and intended procedure confirmed with present staff. Received instructions for my participation in the procedure from the performing physician.

## 2024-01-24 NOTE — Progress Notes (Signed)
 Vss nad trans to pacu

## 2024-01-24 NOTE — Progress Notes (Signed)
 Pt's states no medical or surgical changes since previsit or office visit.

## 2024-01-24 NOTE — Patient Instructions (Addendum)
-   Resume previous diet. - Continue present medications. - Resume Plavix  today - Trial of Zofran  4mg  ODT every 6 hours PRN for nausea - Await pathology results.  YOU HAD AN ENDOSCOPIC PROCEDURE TODAY AT THE [facility]:   Refer to the procedure report that was given to you for any specific questions about what was found during the examination.  If the procedure report does not answer your questions, please call your gastroenterologist to clarify.  If you requested that your care partner not be given the details of your procedure findings, then the procedure report has been included in a sealed envelope for you to review at your convenience later.  YOU SHOULD EXPECT: Some feelings of bloating in the abdomen. Passage of more gas than usual.  Walking can help get rid of the air that was put into your GI tract during the procedure and reduce the bloating. If you had a lower endoscopy (such as a colonoscopy or flexible sigmoidoscopy) you may notice spotting of blood in your stool or on the toilet paper. If you underwent a bowel prep for your procedure, you may not have a normal bowel movement for a few days.  Please Note:  You might notice some irritation and congestion in your nose or some drainage.  This is from the oxygen used during your procedure.  There is no need for concern and it should clear up in a day or so.  SYMPTOMS TO REPORT IMMEDIATELY: Following upper endoscopy (EGD)  Vomiting of blood or coffee ground material  New chest pain or pain under the shoulder blades  Painful or persistently difficult swallowing  New shortness of breath  Fever of 100F or higher  Black, tarry-looking stools  For urgent or emergent issues, a gastroenterologist can be reached at any hour by calling [phone]. Do not use MyChart messaging for urgent concerns.    DIET:  We do recommend a small meal at first, but then you may proceed to your regular diet.  Drink plenty of fluids but you  should avoid alcoholic beverages for 24 hours.  ACTIVITY:  You should plan to take it easy for the rest of today and you should NOT DRIVE or use heavy machinery until tomorrow (because of the sedation medicines used during the test).    FOLLOW UP: Our staff will call the number listed on your records the next business day following your procedure.  We will call around 7:15- 8:00 am to check on you and address any questions or concerns that you may have regarding the information given to you following your procedure. If we do not reach you, we will leave a message.     If any biopsies were taken you will be contacted by phone or by letter within the next 1-3 weeks.  Please call us  at [phone] if you have not heard about the biopsies in 3 weeks.    SIGNATURES/CONFIDENTIALITY: You and/or your care partner have signed paperwork which will be entered into your electronic medical record.  These signatures attest to the fact that that the information above on your After Visit Summary has been reviewed and is understood.  Full responsibility of the confidentiality of this discharge information lies with you and/or your care-partner.

## 2024-01-24 NOTE — Op Note (Signed)
 [facility] Patient Name: Leah [name] Procedure Date: 01/24/2024 11:37 AM MRN: [id number] Endoscopist: Elspeth P. Leigh , MD, [id number] Age: 78 Referring MD:  Date of Birth: 07-01-1946 Gender: Female Account #: [id number] Procedure:                Upper GI endoscopy Indications:              Abnormal CT of the GI tract, Weight loss - nausea,                            poor PO intake - CT shows antral thickening. ON                            protonix , pepcid . Medicines:                Monitored Anesthesia Care Procedure:                Pre-Anesthesia Assessment:                           - Prior to the procedure, a History and Physical                            was performed, and patient medications and                            allergies were reviewed. The patient's tolerance of                            previous anesthesia was also reviewed. The risks                            and benefits of the procedure and the sedation                            options and risks were discussed with the patient.                            All questions were answered, and informed consent                            was obtained. Prior Anticoagulants: The patient has                            taken Plavix  (clopidogrel ), last dose was 5 days                            prior to procedure. ASA Grade Assessment: III - A                            patient with severe systemic disease. After                            reviewing the risks and benefits, the patient was  deemed in satisfactory condition to undergo the                            procedure.                           After obtaining informed consent, the endoscope was                            passed under direct vision. Throughout the                            procedure, the patient's blood pressure, pulse, and                            oxygen saturations were monitored continuously. The                             Olympus Scope [id number] was introduced through the                            mouth, and advanced to the second part of duodenum.                            The upper GI endoscopy was accomplished without                            difficulty. The patient tolerated the procedure                            well. Scope In: Scope Out: Findings:                 Esophagogastric landmarks were identified: the                            Z-line was found at 36 cm, the gastroesophageal                            junction was found at 36 cm and the upper extent of                            the gastric folds was found at 36 cm from the                            incisors.                           A mild Schatzki ring was found in the distal                            esophagus, it was widely patent.                           The exam of the esophagus was otherwise normal.  Multiple small sessile polyps were found in the                            gastric fundus and in the gastric body. Likely                            benign fundic gland polyps. Biopsies were taken                            with a cold forceps for histology.                           There was a prominent fold of the antrum. It was                            biopsied. The exam of the stomach was otherwise                            normal.                           Biopsies were taken with a cold forceps for                            Helicobacter pylori testing.                           A single suspected 3 mm flat polyp vs. plaque was                            found in the second portion of the duodenum. The                            polyp was removed with a cold biopsy forceps.                            Resection and retrieval were complete.                           The exam of the duodenum was otherwise normal.                           Biopsies for histology were  taken with a cold                            forceps for evaluation of celiac disease. Complications:            No immediate complications. Estimated blood loss:                            Minimal. Estimated Blood Loss:     Estimated blood loss was minimal. Impression:               - Esophagogastric landmarks identified.                           -  Mild Schatzki ring - widely patent                           - Normal esophagus otherwis.                           - Multiple gastric polyps. Biopsied.                           - Prominent antral fold.                           - Normal stomach otherwise. Biopsies taken to rule                            out H pylori                           - A single duodenal polyp. Resected and retrieved.                           - Normal duodenum otherwise.                           - Biopsies were taken with a cold forceps for                            evaluation of celiac disease.                           Overall, no concerning findings in relation to CT                            findings. Will await biopsy results. Recommendation:           - Patient has a contact number available for                            emergencies. The signs and symptoms of potential                            delayed complications were discussed with the                            patient. Return to normal activities tomorrow.                            Written discharge instructions were provided to the                            patient.                           - Resume previous diet.                           - Continue present medications.                           -  Resume Plavix  today                           - Trial of Zofran  4mg  ODT every 6 hours PRN for                            nausea                           - Await pathology results. Elspeth P. Burnetta Kohls, MD 01/24/2024 12:02:23 PM This report has been signed electronically.

## 2024-01-25 ENCOUNTER — Telehealth: Payer: Self-pay

## 2024-01-25 NOTE — Telephone Encounter (Signed)
 No answer after follow up call. Voice message left.

## 2024-01-28 LAB — SURGICAL PATHOLOGY

## 2024-01-29 ENCOUNTER — Ambulatory Visit: Payer: Self-pay | Admitting: Gastroenterology

## 2024-01-31 ENCOUNTER — Encounter: Payer: Self-pay | Admitting: Family Medicine

## 2024-01-31 ENCOUNTER — Ambulatory Visit (INDEPENDENT_AMBULATORY_CARE_PROVIDER_SITE_OTHER): Admitting: Family Medicine

## 2024-01-31 ENCOUNTER — Other Ambulatory Visit: Payer: Self-pay | Admitting: Family Medicine

## 2024-01-31 VITALS — BP 120/80 | HR 81 | Temp 98.3°F | Resp 18 | Ht 60.5 in | Wt 86.0 lb

## 2024-01-31 DIAGNOSIS — R918 Other nonspecific abnormal finding of lung field: Secondary | ICD-10-CM | POA: Diagnosis not present

## 2024-01-31 DIAGNOSIS — R634 Abnormal weight loss: Secondary | ICD-10-CM

## 2024-01-31 DIAGNOSIS — E43 Unspecified severe protein-calorie malnutrition: Secondary | ICD-10-CM

## 2024-01-31 DIAGNOSIS — J449 Chronic obstructive pulmonary disease, unspecified: Secondary | ICD-10-CM

## 2024-01-31 DIAGNOSIS — F17200 Nicotine dependence, unspecified, uncomplicated: Secondary | ICD-10-CM

## 2024-01-31 DIAGNOSIS — I10 Essential (primary) hypertension: Secondary | ICD-10-CM

## 2024-01-31 DIAGNOSIS — E785 Hyperlipidemia, unspecified: Secondary | ICD-10-CM

## 2024-01-31 NOTE — Progress Notes (Signed)
 Established Patient Office Visit  Subjective   Patient ID: Leah [name], female    DOB: 02/19/1946  Age: 78 y.o. MRN: [id number]  Chief Complaint  Patient presents with   Weight Loss    HPI Discussed the use of AI scribe software for clinical note transcription with the patient, who gave verbal consent to proceed.  History of Present Illness Leah [name] is a 78 year old female who presents with unintentional weight loss and abdominal pain.  She has experienced significant unintentional weight loss, now weighing 86 pounds. She describes persistent abdominal pain that affects her appetite, stating that her stomach bothers her frequently and she does not feel hungry when it hurts.  Despite undergoing extensive testing, including CT scans of the abdomen, liver, and pancreas, the cause of her symptoms remains unclear. She has tried various medications, including Bentyl , which does not consistently help, and Protonix  for her stomach. She also takes a probiotic.  She mentions having lung nodules identified on a CT scan in May, initially one, then two, but notes they were 'getting lower.' She has not seen a pulmonologist and has not received follow-up from her nutritionist despite previous referrals.  She has tried various dietary approaches, including eating meats, potatoes, and bread, but struggles to gain weight. She lives in [place] and has a history of working in [place]. No constipation or diarrhea, but she reports nausea, sometimes using Zofran  for relief.    Patient Active Problem List   Diagnosis Date Noted   Moderate protein-calorie malnutrition (HCC) 10/01/2023   Vitamin D  deficiency 09/03/2023   Weight loss, abnormal 03/24/2022   Acute cough 06/28/2021   Acute pain of right shoulder 09/07/2020   Cold intolerance 09/07/2020   SBO (small bowel obstruction) (HCC) 09/19/2019   Current every day smoker 09/19/2019   Diverticulitis 09/19/2019   Partial small bowel  obstruction (HCC) 09/04/2019   Preventative health care 08/27/2019   Family history of coronary artery disease 02/17/2019   Claudication in peripheral vascular disease (HCC) 10/14/2018   Dyspnea 09/03/2018   Calcification of native coronary artery 09/03/2018   PAD (peripheral artery disease) (HCC) 09/03/2018   History of esophageal stricture 06/19/2018   Rhinitis, chronic 04/23/2017   Small bowel obstruction due to adhesions (HCC) 04/08/2016   Frozen shoulder 04/27/2015   Hematoma 09/07/2014   Cervical spondylosis with myelopathy 08/18/2014   Cervical spondylosis with radiculopathy 08/18/2014   Neck pain 05/19/2014   Fatigue 05/08/2014   Back pain 05/08/2014   Pain in joint, upper arm 05/08/2014   LLQ abdominal pain 03/02/2014   History of diverticulitis 03/02/2014   COPD exacerbation (HCC) 01/28/2014   Oral thrush 01/28/2014   Influenza 10/27/2013   Appendicitis 09/19/2013   Abdominal pain, left lower quadrant 03/05/2013   Essential hypertension 02/07/2013   Sinusitis, acute 01/15/2013   COPD GOLD II 01/15/2013   Smoker 12/20/2012   Epigastric pain 09/03/2012   Left lower quadrant pain 01/24/2011   Leg pain, left 06/07/2009   ROSACEA 04/16/2009   B12 DEFICIENCY 04/09/2009   ANEMIA, IRON DEFICIENCY 03/19/2009   MALAISE AND FATIGUE 03/19/2009   NECK PAIN, LEFT 01/11/2009   URI 07/30/2008   Constipation 05/26/2008   NAUSEA AND VOMITING 05/26/2008   Abdominal pain 05/26/2008   Claudication (HCC) 04/27/2008   VOMITING 04/17/2008   Dysphagia 04/17/2008   ANXIETY DEPRESSION 03/12/2008   TREMOR 03/12/2008   Hyperlipidemia 11/13/2007   DEPRESSION 11/13/2007   ESOPHAGEAL SPASM 11/13/2007   GERD (gastroesophageal reflux disease) 11/13/2007  IBS 11/13/2007   CARPAL TUNNEL SYNDROME, HX OF 11/13/2007   Diverticulosis of colon (without mention of hemorrhage) 03/14/2007   GASTRITIS, CHRONIC 04/10/2002   HIATAL HERNIA 04/10/2002   Past Medical History:  Diagnosis Date    Allergy    ANEMIA, IRON DEFICIENCY 03/19/2009   Qualifier: Diagnosis of   By: Antonio ROSALEA Rockers         Anxiety    Barrett esophagus    Bladder pain    Cataract    Chronic bronchitis (HCC)    per pt last episode july 2016   Complication of anesthesia    slow to wake   COPD (chronic obstructive pulmonary disease) (HCC)    Depression    Diverticulosis of colon (without mention of hemorrhage) 03/14/2007   Centricity Description: DIVERTICULOSIS, COLON  Qualifier: Diagnosis of   By: Lewellyn CMA (AAMA), Amanda      Centricity Description: DIVERTICULOSIS-COLON  Qualifier: Diagnosis of   By: Ever Riggers, Amy S     Replacing diagnoses that were inactivated after the 10/30/22 regulatory import     Exocrine pancreatic insufficiency    Frequency of urination    GERD (gastroesophageal reflux disease)    Hiatal hernia    History of adenomatous polyp of colon    tubular adenoma   History of chronic gastritis    History of diverticulitis 03/02/2014   History of diverticulitis of colon    History of esophageal dilatation    for stricture   History of esophageal spasm    Hyperlipidemia    Hypertension    IBS 11/13/2007   Qualifier: Diagnosis of   By: Lewellyn CMA (AAMA), Amanda         IBS (irritable bowel syndrome)    Interstitial cystitis    PAD (peripheral artery disease) (HCC)    Partial small bowel obstruction (HCC) 09/04/2019   Productive cough    intermittant   SBO (small bowel obstruction) (HCC) 09/19/2019   Smokers' cough (HCC)    Urgency of urination    Past Surgical History:  Procedure Laterality Date   ABDOMINAL AORTOGRAM N/A 10/14/2018   Procedure: ABDOMINAL AORTOGRAM;  Surgeon: Court Dorn PARAS, MD;  Location: [facility] INVASIVE CV LAB;  Service: Cardiovascular;  Laterality: N/A;   ABDOMINAL HYSTERECTOMY  1975   ANTERIOR CERVICAL DECOMP/DISCECTOMY FUSION N/A 09/07/2014   Procedure: Exploration of Fusion and Removal of Anterior Cervical Hematoma;  Surgeon: Reyes Budge, MD;   Location: [facility] NEURO ORS;  Service: Neurosurgery;  Laterality: N/A;   CARPAL TUNNEL RELEASE Right 1993   COLONOSCOPY  last one 04-30-2013   COLONOSCOPY WITH PROPOFOL  N/A 09/09/2019   Procedure: COLONOSCOPY WITH PROPOFOL ;  Surgeon: Legrand Victory LITTIE DOUGLAS, MD;  Location: [facility] ENDOSCOPY;  Service: Gastroenterology;  Laterality: N/A;   CYSTO WITH HYDRODISTENSION N/A 06/08/2015   Procedure: CYSTOSCOPY/HYDRODISTENSION INSTILLATION OF MARCAINE  AND PYRIDIUM ;  Surgeon: Norleen Seltzer, MD;  Location: [facility];  Service: Urology;  Laterality: N/A;   CYSTO/  HOD/  BLADDER BX  1990's   ESOPHAGOGASTRODUODENOSCOPY  last one 09-11-2012   LAPAROSCOPIC APPENDECTOMY N/A 09/21/2013   Procedure: APPENDECTOMY LAPAROSCOPIC;  Surgeon: Donnice Bury, MD;  Location: [facility] OR;  Service: General;  Laterality: N/A;   LAPAROSCOPIC CHOLECYSTECTOMY  2002   LAPAROSCOPY SIGMOID COLECTOMY  10-05-2008   diverticulitis   LOWER EXTREMITY ANGIOGRAPHY Left 10/14/2018   LOWER EXTREMITY ANGIOGRAPHY Bilateral 10/14/2018   Procedure: Lower Extremity Angiography;  Surgeon: Court Dorn PARAS, MD;  Location: [facility] INVASIVE CV LAB;  Service: Cardiovascular;  Laterality:  Bilateral;  ILIACS   MICROLARYNGOSCOPY  09-27-2006   w/ true vocal cord stripping  and bilateral bx's of lesion's (benign)   PERIPHERAL VASCULAR INTERVENTION  10/14/2018   Procedure: PERIPHERAL VASCULAR INTERVENTION;  Surgeon: Court Dorn PARAS, MD;  Location: [facility] INVASIVE CV LAB;  Service: Cardiovascular;;   SINUS SURGERY WITH INSTATRAK  1991   TEMPOROMANDIBULAR JOINT SURGERY  1990   TONSILLECTOMY AND ADENOIDECTOMY  1964   Social History   Tobacco Use   Smoking status: Every Day    Current packs/day: 0.50    Average packs/day: 0.7 packs/day for 48.5 years (36.0 ttl pk-yrs)    Types: Cigarettes    Start date: 2024   Smokeless tobacco: Never   Tobacco comments:    counseled about smoking cessation  Vaping Use   Vaping status: Never Used  Substance Use Topics    Alcohol use: No    Alcohol/week: 0.0 standard drinks of alcohol   Drug use: No   Social History   Socioeconomic History   Marital status: Married    Spouse name: Not on file   Number of children: 3   Years of education: Not on file   Highest education level: Not on file  Occupational History   Not on file  Tobacco Use   Smoking status: Every Day    Current packs/day: 0.50    Average packs/day: 0.7 packs/day for 48.5 years (36.0 ttl pk-yrs)    Types: Cigarettes    Start date: 2024   Smokeless tobacco: Never   Tobacco comments:    counseled about smoking cessation  Vaping Use   Vaping status: Never Used  Substance and Sexual Activity   Alcohol use: No    Alcohol/week: 0.0 standard drinks of alcohol   Drug use: No   Sexual activity: Not Currently    Birth control/protection: Post-menopausal  Other Topics Concern   Not on file  Social History Narrative   Not on file   Social Drivers of Health   Financial Resource Strain: Not on file  Food Insecurity: Not on file  Transportation Needs: Not on file  Physical Activity: Not on file  Stress: Not on file  Social Connections: Not on file  Intimate Partner Violence: Not on file   Family Status  Relation Name Status   Mother  Deceased   Father  Deceased   Sister  Alive   Brother  Alive   MGM  Deceased   MGF  Deceased   PGM  Deceased   PGF  Deceased   Daughter  Alive   Daughter  Alive   Son  Alive   Neg Hx  (Not Specified)  No partnership data on file   Family History  Problem Relation Age of Onset   Pancreatic cancer Mother    Colon polyps Mother    Pancreatic cancer Father    Diabetes Father    Heart disease Father    Leukemia Father    Prostate cancer Father    Hypertension Father    Heart disease Brother    Heart attack Brother    Other Daughter        prediabetes   Heart disease Daughter    Diabetes Daughter    Colon cancer Neg Hx    Esophageal cancer Neg Hx    Rectal cancer Neg Hx    Stomach  cancer Neg Hx    Allergies  Allergen Reactions   Amoxicillin-Pot Clavulanate Shortness Of Breath, Itching and Swelling   Avelox [Moxifloxacin Hcl  In Nacl] Swelling    Swelling, itching and shortness of breath   Ciprofloxacin Shortness Of Breath, Itching and Swelling   Keflex  [Cephalexin ] Shortness Of Breath and Swelling   Penicillins Shortness Of Breath, Itching and Swelling    Did it involve swelling of the face/tongue/throat, SOB, or low BP? Yes Did it involve sudden or severe rash/hives, skin peeling, or any reaction on the inside of your mouth or nose? No Did you need to seek medical attention at a hospital or doctor's office? Yes When did it last happen?      Several years If all above answers are "NO", may proceed with cephalosporin use.    Sulfa Antibiotics Shortness Of Breath, Itching and Swelling   Clindamycin /Lincomycin Itching and Swelling   Levaquin  [Levofloxacin ] Hives   Metronidazole  Other (See Comments)    Pt unsure of reaction   Prednisone  Other (See Comments)    GI irritation; is able to tolerate injections   Tizanidine  Other (See Comments)    Pt unsure of reaction   Tramadol  Itching   Acyclovir And Related Other (See Comments)    Pt does not recall this reaction   Hydrocodone  Rash   Zyvox  [Linezolid ] Diarrhea    Review of Systems  Constitutional:  Positive for weight loss. Negative for fever and malaise/fatigue.  HENT:  Negative for congestion.   Eyes:  Negative for blurred vision.  Respiratory:  Negative for shortness of breath.   Cardiovascular:  Negative for chest pain, palpitations and leg swelling.  Gastrointestinal:  Negative for abdominal pain, blood in stool, constipation, diarrhea, nausea and vomiting.  Genitourinary:  Negative for dysuria and frequency.  Musculoskeletal:  Negative for falls.  Skin:  Negative for rash.  Neurological:  Negative for dizziness, loss of consciousness and headaches.  Endo/Heme/Allergies:  Negative for environmental  allergies.  Psychiatric/Behavioral:  Negative for depression. The patient is not nervous/anxious.       Objective:     BP 120/80 (BP Location: Left Arm, Patient Position: Sitting, Cuff Size: Small)   Pulse 81   Temp 98.3 F (36.8 C) (Oral)   Resp 18   Ht 5' 0.5 (1.537 m)   Wt 86 lb (39 kg)   SpO2 96%   BMI 16.52 kg/m  BP Readings from Last 3 Encounters:  01/31/24 120/80  01/24/24 (!) 110/54  11/27/23 118/70   Wt Readings from Last 3 Encounters:  01/31/24 86 lb (39 kg)  01/24/24 86 lb (39 kg)  11/27/23 86 lb 9.6 oz (39.3 kg)   SpO2 Readings from Last 3 Encounters:  01/31/24 96%  01/24/24 96%  11/27/23 96%      Physical Exam Vitals and nursing note reviewed.  Constitutional:      General: She is not in acute distress.    Appearance: Normal appearance. She is cachectic.  HENT:     Head: Normocephalic and atraumatic.  Eyes:     General: No scleral icterus.       Right eye: No discharge.        Left eye: No discharge.  Cardiovascular:     Rate and Rhythm: Normal rate and regular rhythm.     Heart sounds: No murmur heard. Pulmonary:     Effort: Pulmonary effort is normal. No respiratory distress.     Breath sounds: Normal breath sounds.  Musculoskeletal:        General: Normal range of motion.     Cervical back: Normal range of motion and neck supple.     Right  lower leg: No edema.     Left lower leg: No edema.  Skin:    General: Skin is warm and dry.  Neurological:     Mental Status: She is alert and oriented to person, place, and time.  Psychiatric:        Mood and Affect: Mood normal.        Behavior: Behavior normal.        Thought Content: Thought content normal.        Judgment: Judgment normal.      No results found for any visits on 01/31/24.  Last CBC Lab Results  Component Value Date   WBC 6.4 11/01/2023   HGB 13.9 11/01/2023   HCT 41.4 11/01/2023   MCV 93.5 11/01/2023   MCH 30.6 07/02/2023   RDW 14.0 11/01/2023   PLT 213.0  11/01/2023   Last metabolic panel Lab Results  Component Value Date   GLUCOSE 106 (H) 11/01/2023   NA 140 11/01/2023   K 4.4 11/01/2023   CL 102 11/01/2023   CO2 31 11/01/2023   BUN 11 11/01/2023   CREATININE 0.84 11/01/2023   GFR 67.98 11/01/2023   CALCIUM  9.3 11/01/2023   PROT 6.7 11/01/2023   ALBUMIN 4.3 11/01/2023   BILITOT 0.4 11/01/2023   ALKPHOS 67 11/01/2023   AST 18 11/01/2023   ALT 14 11/01/2023   ANIONGAP 5 09/08/2019   Last lipids Lab Results  Component Value Date   CHOL 142 09/03/2023   HDL 46.90 09/03/2023   LDLCALC 75 09/03/2023   LDLDIRECT 91.0 01/10/2022   TRIG 105.0 09/03/2023   CHOLHDL 3 09/03/2023   Last hemoglobin A1c Lab Results  Component Value Date   HGBA1C 6.2 07/05/2023   Last thyroid  functions Lab Results  Component Value Date   TSH 1.44 09/03/2023   Last vitamin D  Lab Results  Component Value Date   VD25OH 13.38 (L) 09/03/2023   Last vitamin B12 and Folate Lab Results  Component Value Date   VITAMINB12 348 03/06/2023   FOLATE 10.9 03/19/2009      The 10-year ASCVD risk score (Arnett DK, et al., 2019) is: 25.4%    Assessment & Plan:   Problem List Items Addressed This Visit   None Visit Diagnoses       Pulmonary nodules    -  Primary   Relevant Orders   Ambulatory referral to Pulmonology     Weight loss       Relevant Orders   Ambulatory referral to Pulmonology   Amb ref to Medical Nutrition Therapy-MNT     Assessment and Plan Assessment & Plan  ] Unintentional weight loss   She has experienced significant weight loss to 85 pounds with no clear cause despite extensive testing. Abdominal pain and anorexia may contribute to this weight loss. Previous CT scans of the abdomen and liver were inconclusive. Current medications, including Bentyl , Protonix , and probiotics, have provided mixed results. There is an urgent need to address her nutritional status. Despite referrals, there has been no contact from a  nutritionist. Refer her to a nutritionist for evaluation and management of weight loss. Ensure she uses nutritional supplements like Boost or Ensure. Follow up on the nutritionist referral to ensure contact is made.  Abdominal pain   She suffers from chronic abdominal pain with no clear cause despite extensive testing, associated with nausea and anorexia. Current medications, Bentyl , Protonix , and probiotics, provide varying relief. Discussed increasing Bentyl  dosage to manage symptoms. Increase Bentyl  dosage if needed and  assess response. Continue Protonix  and probiotics as previously prescribed. Consider using Zofran  for nausea as needed.  Pulmonary nodules   Pulmonary nodules identified on a CT scan in May have been decreasing in size. A referral to a pulmonologist is necessary for further evaluation. Refer her to a pulmonologist for evaluation of pulmonary nodules.    Return if symptoms worsen or fail to improve.    Ramia Sidney R Lowne Chase, DO

## 2024-01-31 NOTE — Patient Instructions (Signed)
 High-Protein and High-Calorie Diet Eating high-protein and high-calorie foods can help you to gain weight, heal after an injury, and get better after an illness or surgery. The amount of daily protein and calories you need depends on: Your body weight. The reason you were told to follow this diet. Usually, a high-protein, high-calorie diet means that you should: Eat 250-500 extra calories each day. Make sure that you get enough of your daily calories from protein. Ask your health care provider how many of your calories should come from protein and how many calories total you need each day. Follow the diet as told by your provider. What are tips for following this plan? Reading food labels Check the nutrition facts label for calories, and grams of fat and protein. Items with more than 4 grams of protein are high-protein foods. General information  Ask your provider if you should take a nutritional supplement. Try to eat six small meals each day instead of three large meals. A goal is usually to eat every 2 to 3 hours. Eat a balanced diet. In each meal, include one food that's high in protein and one food with fat in it. Keep nutritious snacks available, such as nuts, trail mixes, dried fruit, and whole-milk yogurt. If you have kidney disease or diabetes, talk with your provider about how much protein is safe for you. Too much protein may put extra stress on your kidneys. Replace zero-calorie drinks with drinks that have calories in them, such as milk and 100% fruit juice. Consider setting a timer to remind you to eat. You'll want to eat even if you do not feel very hungry. Preparing meals Milk and dairy foods. Add whole milk, half-and-half, or heavy cream to cereal, pudding, soup, or hot cocoa. Add whole milk to instant breakfast drinks. Add powdered milk to baked goods, smoothies, or milkshakes. Add powdered milk, cream, or butter to mashed potatoes. Replace water with milk or heavy cream  when making foods such as oatmeal, pudding, or cocoa. Make cream-based pastas and soups. Add cheese to cooked vegetables. Make whole-milk yogurt parfaits. Top them with granola, fruit, or nuts. Add cottage cheese to fruit. Add cream cheese to sandwiches or as a topping on crackers and bread. Eggs. Add hard-boiled eggs to salads. Keep hard-boiled eggs in the fridge to snack on. Add cheese to cooked eggs. Beans, nuts, and seeds. Add peanut butter to oatmeal or smoothies. Use peanut butter as a dip for fruits and vegetables or as a topping for pretzels, celery, or crackers. Add beans to casseroles, dips, and spreads. Add pureed beans to sauces and soups. Salads, soups, and other foods. Add avocado, cheese, or both to sandwiches or salads. Add avocado to smoothies. Add meat, poultry, or seafood to rice, pasta, casseroles, salads, and soups. Use mayonnaise when making egg salad, chicken salad, or tuna salad. Add oil or butter to cooked vegetables and grains. What high-protein foods should I eat?  Vegetables Soybeans. Peas. Grains Quinoa. Bulgur wheat. Buckwheat. Meats and other proteins Beef, pork, and poultry. Fish and seafood. Eggs. Tofu. Textured vegetable protein (TVP). Peanut butter. Nuts and seeds. Dried beans. Protein powders. Hummus. Jerky. Dairy Whole milk. Whole-milk yogurt. Powdered milk. Cheese. Cottage cheese. Eggnog. Beverages High-protein supplement drinks. Soy milk. Other foods Protein bars. The items listed above may not be all the foods and drinks you can have. Talk with an expert in healthy eating called a dietitian to learn more. What high-calorie foods should I eat? Fruits Dried fruit. Fruit leather. Canned  fruit in syrup. Fruit juice. Avocado. Vegetables Vegetables cooked in oil or butter. Fried potatoes. Grains Pasta. Quick breads. Muffins. Pancakes. Granola. Meats and other proteins Peanut butter and other nut butters. Nuts and seeds. Dairy Heavy cream.  Whipped cream. Cream cheese. Sour cream. Ice cream. Custard. Pudding. Whole-milk dairy products. Beverages Meal-replacement beverages. Nutrition shakes. Fruit juice. Seasonings and condiments Salad dressing. Mayonnaise. Alfredo sauce. Fruit preserves or jelly. Honey. Syrup. Sweets and desserts Cake. Cookies. Pie. Pastries. Candy bars. Chocolate. Fats and oils Butter or margarine. Oil. Gravy. Other foods Meal-replacement bars. The items listed above may not be all the foods and drinks you can have. Talk with an expert in healthy eating to learn more. This information is not intended to replace advice given to you by your health care provider. Make sure you discuss any questions you have with your health care provider. Document Revised: 12/11/2022 Document Reviewed: 12/11/2022 Elsevier Patient Education  2024 [employer].

## 2024-02-04 ENCOUNTER — Telehealth: Payer: Self-pay

## 2024-02-04 NOTE — Progress Notes (Signed)
 Complex Care Management Note  Care Guide Note 02/04/2024 Name: Leah [name] MRN: [id number] DOB: 1945/11/01  [name] is a 78 y.o. year old female who sees Antonio Meth, Jamee SAUNDERS, DO for primary care. I reached out to Oveta G Gilpatrick by phone today to offer complex care management services.  [name] was given information about Complex Care Management services today including:   The Complex Care Management services include support from the care team which includes your Nurse Care Manager, Clinical Social Worker, or Pharmacist.  The Complex Care Management team is here to help remove barriers to the health concerns and goals most important to you. Complex Care Management services are voluntary, and the patient may decline or stop services at any time by request to their care team member.   Complex Care Management Consent Status: Patient agreed to services and verbal consent obtained.   Follow up plan:  Telephone appointment with complex care management team member scheduled for:  02/05/2024  Encounter Outcome:  Patient Scheduled  Jeoffrey Buffalo , RMA     [place]  [facility], [facility] Guide  Direct Dial: [phone]  Website: [email or web address]

## 2024-02-05 ENCOUNTER — Other Ambulatory Visit: Payer: Self-pay

## 2024-02-05 DIAGNOSIS — E43 Unspecified severe protein-calorie malnutrition: Secondary | ICD-10-CM

## 2024-02-05 NOTE — Patient Instructions (Signed)
 Visit Information  Thank you for taking time to visit with me today. Please don't hesitate to contact me if I can be of assistance to you before our next scheduled appointment.  Our next appointment is no further scheduled appointments.  on  at  Please call the care guide team at [phone] if you need to cancel or reschedule your appointment.   Following is a copy of your care plan:   Goals Addressed             This Visit's Progress    VBCI RN Care Plan       Problems:  Care coordination regarding referrals to Nutrition and Pulmonology  Goal: Over the next 7 days the Patient will schedule appointment with [facility] Pulmonology in [place].  Interventions:   Evaluation of current treatment plan related to Severe Protein Malnutrition, care coordination self-management and patient's adherence to plan as established by provider. Discussed plans with patient for ongoing care management follow up and provided patient with direct contact information for care management team Collaborated with patient regarding discussion of nutritional barriers, scheduling of appointment.  Patient Self-Care Activities:  Attend all scheduled provider appointments Call pharmacy for medication refills 3-7 days in advance of running out of medications Take medications as prescribed    Plan:  No further follow up required: patient was provided [facility] contact info for any future needs.             Please call the Suicide and Crisis Lifeline: 988 call the [place]  National Suicide Prevention Lifeline: (201)868-6685 or TTY: (678)640-3135 TTY 914-008-9864) to talk to a trained counselor call 1-800-273-TALK (toll free, 24 hour hotline) if you are experiencing a Mental Health or Behavioral Health Crisis or need someone to talk to.  Patient verbalizes understanding of instructions and care plan provided today and agrees to view in MyChart. Active MyChart status and patient understanding of how to access  instructions and care plan via MyChart confirmed with patient.     SIGNATURE  Olam Idol BSN RN CCM [place]  [facility], [facility] RN Care Manager Direct Dial: [phone] Fax: [phone]

## 2024-02-05 NOTE — Patient Outreach (Signed)
 Complex Care Management   Visit Note  02/05/2024  Name:  Leah [name] MRN: [id number] DOB: August 05, 1945  Situation: Referral received for Complex Care Management related to referral coordination. I obtained verbal consent from Patient.  Visit completed with patient  on the phone  Background:   Past Medical History:  Diagnosis Date   Allergy    ANEMIA, IRON DEFICIENCY 03/19/2009   Qualifier: Diagnosis of   By: Antonio ROSALEA Rockers         Anxiety    Barrett esophagus    Bladder pain    Cataract    Chronic bronchitis (HCC)    per pt last episode july 2016   Complication of anesthesia    slow to wake   COPD (chronic obstructive pulmonary disease) (HCC)    Depression    Diverticulosis of colon (without mention of hemorrhage) 03/14/2007   Centricity Description: DIVERTICULOSIS, COLON  Qualifier: Diagnosis of   By: Lewellyn CMA (AAMA), Amanda      Centricity Description: DIVERTICULOSIS-COLON  Qualifier: Diagnosis of   By: Ever Riggers, Amy S     Replacing diagnoses that were inactivated after the 10/30/22 regulatory import     Exocrine pancreatic insufficiency    Frequency of urination    GERD (gastroesophageal reflux disease)    Hiatal hernia    History of adenomatous polyp of colon    tubular adenoma   History of chronic gastritis    History of diverticulitis 03/02/2014   History of diverticulitis of colon    History of esophageal dilatation    for stricture   History of esophageal spasm    Hyperlipidemia    Hypertension    IBS 11/13/2007   Qualifier: Diagnosis of   By: Earlean CMA (AAMA), Amanda         IBS (irritable bowel syndrome)    Interstitial cystitis    PAD (peripheral artery disease) (HCC)    Partial small bowel obstruction (HCC) 09/04/2019   Productive cough    intermittant   SBO (small bowel obstruction) (HCC) 09/19/2019   Smokers' cough (HCC)    Urgency of urination     Assessment: Patient Reported Symptoms:  Cognitive        Neurological       HEENT        Cardiovascular      Respiratory      Endocrine      Gastrointestinal        Genitourinary      Integumentary      Musculoskeletal          Psychosocial       Do you feel physically threatened by others?: No      03/06/2023   11:17 AM  Depression screen PHQ 2/9  Decreased Interest 1  Down, Depressed, Hopeless 1  PHQ - 2 Score 2  Altered sleeping 0  Tired, decreased energy 1  Change in appetite 1  Feeling bad or failure about yourself  0  Trouble concentrating 0  Moving slowly or fidgety/restless 0  Suicidal thoughts 0  PHQ-9 Score 4  Difficult doing work/chores Not difficult at all    There were no vitals filed for this visit.  Medications Reviewed Today   Medications were not reviewed in this encounter     Recommendation:   Specialty provider follow-up patient has declined referral to Nutrition Specialist, as is not covered by her insurance plan.  Patient was provided contact information for [facility] [place] to [place].  Patient had no other CM needs and does not have eligible diagnosis for enrollment in [facility].  Patient was provided with [facility] contact information for any future needs.  Follow Up Plan:   Closing From:  Complex Care Management  SIG  Olam Idol BSN RN CCM [place]  [facility], [facility] RN Care Manager Direct Dial: [phone] Fax: [phone]

## 2024-02-06 NOTE — Addendum Note (Signed)
 Addended by: LONZELL PLANAS on: 02/06/2024 03:28 PM   Modules accepted: Orders

## 2024-02-07 ENCOUNTER — Other Ambulatory Visit: Payer: Self-pay | Admitting: Family Medicine

## 2024-02-07 ENCOUNTER — Other Ambulatory Visit: Payer: Self-pay | Admitting: Cardiovascular Disease

## 2024-02-07 DIAGNOSIS — E785 Hyperlipidemia, unspecified: Secondary | ICD-10-CM

## 2024-02-13 ENCOUNTER — Telehealth: Payer: Self-pay

## 2024-02-13 NOTE — Progress Notes (Signed)
 Complex Care Management Note  Care Guide Note 02/13/2024 Name: Leah [name] MRN: [id number] DOB: 1945-12-12  Leah [name] is a 78 y.o. year old female who sees Antonio Meth, Jamee SAUNDERS, DO for primary care. I reached out to Danamarie G Patchin by phone today to offer complex care management services.  Leah [name] was given information about Complex Care Management services today including:   The Complex Care Management services include support from the care team which includes your Nurse Care Manager, Clinical Social Worker, or Pharmacist.  The Complex Care Management team is here to help remove barriers to the health concerns and goals most important to you. Complex Care Management services are voluntary, and the patient may decline or stop services at any time by request to their care team member.   Complex Care Management Consent Status: Patient agreed to services and verbal consent obtained.   Follow up plan:  Telephone appointment with complex care management team member scheduled for:  02/18/24 at 11:00 a.m.   Encounter Outcome:  Patient Scheduled  Dream[facility]  [facility], [facility] Care Management Assistant Direct Dial: [phone]  Fax: [phone]

## 2024-02-13 NOTE — Progress Notes (Signed)
 Complex Care Management Note Care Guide Note  02/13/2024 Name: Leah [name] MRN: [id number] DOB: [birth year]   Complex Care Management Outreach Attempts: An unsuccessful telephone outreach was attempted today to offer the patient information about available complex care management services.  Follow Up Plan:  Additional outreach attempts will be made to offer the patient complex care management information and services.   Encounter Outcome:  No Answer  Dreama [facility]  [facility], [facility] Care Management Assistant Direct Dial: [phone]  Fax: [phone]

## 2024-02-18 ENCOUNTER — Ambulatory Visit (INDEPENDENT_AMBULATORY_CARE_PROVIDER_SITE_OTHER): Admitting: Pharmacist

## 2024-02-18 VITALS — Wt 86.0 lb

## 2024-02-18 DIAGNOSIS — J449 Chronic obstructive pulmonary disease, unspecified: Secondary | ICD-10-CM

## 2024-02-18 DIAGNOSIS — E43 Unspecified severe protein-calorie malnutrition: Secondary | ICD-10-CM

## 2024-02-18 DIAGNOSIS — F17200 Nicotine dependence, unspecified, uncomplicated: Secondary | ICD-10-CM

## 2024-02-18 NOTE — Progress Notes (Signed)
 02/18/2024 Name: Leah [name] MRN: [id number] DOB: 23-Jun-1946  Chief Complaint  Patient presents with   Medication Management    Leah [name] is a 78 y.o. year old female who presented for a telephone visit.   They were referred to the pharmacist by their Case Management Team  for assistance in managing medication access.    Subjective:  Care Team: Primary Care Provider: Antonio Meth, Jamee SAUNDERS, DO ; Next Scheduled Visit: not currently scheduled Cardiologist: Dr Mona; Next Scheduled Visit: 04/17/2024 Gastroenterologist: Dr Leigh; Next Scheduled Visit: 04/14/2024 Pulmonologist: Lauraine Lites, NP; Initial appointment: 02/27/2024  Medication Access/Adherence  Current Pharmacy:  [id number] - [place], [place] - [address] [facility] [place] [address] Phone: [phone] Fax: [phone]  [facility] - [place], [place] - [address] [place] [address] Phone: [phone] Fax: [phone]  [facility] [address] [place] [address] Phone: [phone] Fax: [phone]   Patient reports affordability concerns with their medications: Yes  - regarding cost of over-the-counter product Ensure or Boost Patient reports access/transportation concerns to their pharmacy: No  Patient reports adherence concerns with their medications:  No     Weight Loss / moderate protein-calorie malnutrition:  Patient was seen by our nurse care manager due to difficulty affording over-the-counter Boost. Unfortunately it is not covered by her insurance plan.  Patient reports she prefers Ensure because it is slightly less expensive compare with Boost and only about 10 calories per serving difference.   She has tried Megace  09/2023 for about 2 months - did not increase appetite and hurt my stomach  She is now seeing Dr Leigh for GI care. She has  been seen at [facility] and was noted in 2024 to be on over-the-counter pancreatic enzymes due to exocrine pancreatic insufficiency. Fecal pancreatic elastase checked 04/17/2022 was low at 141. Dr Leigh check fecal pancreatic elastase again 09/2023 because initial test was performed while patient was having diarrhea and he felt that might have affected the test.   10/04/2023 - pancreatic elastase stool test was WNL at 677 Today she reports she has normal stools about every other day but will occasionally have diarrhea or constipation.  She is taking Miralax  1 scoop daily to prevent constipation.  Other GI related meds - dicyclomine  10mg  once daily as needed.  Pantoprazole  40mg  once a day  Famotidine  20mg  twice a day  She had sigmoid colon resection for diverticulitis and small bowel obstruction with surgery in the past. Endoscopy completed 01/28/2024 - Per GI: Generally, your stomach and small intestine looked okay to me.  I did not find any concerning pathology in regards to your weight loss.  Your CT scan otherwise did not show any evidence of cancer or concerning process which is great news.  Biopsies of your stomach and your small intestine did not show any cause or infection to be causing weight loss.  Last colonoscopy was 09/09/2019   You did have 1 small precancerous polyp called an adenoma in your small intestine.  Given this finding I recommend repeat endoscopy in 1 year for surveillance  She report she has occasionally nausea when she eats too much, denies acid reflux.  Reports her colon cramps from times to time which causes stomach and lower abdominal pain.     COPD:  Current medications: albuterol  as needed  No supplemental oxygen therapy currently Patient reports she mostly  get short of breath when she is in her garden outside. She feels this is mostly due to heat.   Reports 2 exacerbations in the past year - needed antibiotic therapy but no hospitalizations  Tobacco  Abuse:  Tobacco Use History: Age when started using tobacco on a daily basis 20 years sole Number of cigarettes per day 10  Quit Attempt History: Most recent quit attempt several years ago Methods tried in the past include Nicotine  Patch 7mg  and Wellbutrin / Zyban  Objective:  Lab Results  Component Value Date   HGBA1C 6.2 07/05/2023    Lab Results  Component Value Date   CREATININE 0.84 11/01/2023   BUN 11 11/01/2023   NA 140 11/01/2023   K 4.4 11/01/2023   CL 102 11/01/2023   CO2 31 11/01/2023    Lab Results  Component Value Date   CHOL 142 09/03/2023   HDL 46.90 09/03/2023   LDLCALC 75 09/03/2023   LDLDIRECT 91.0 01/10/2022   TRIG 105.0 09/03/2023   CHOLHDL 3 09/03/2023    Medications Reviewed Today     Reviewed by Carla Milling, RPH-CPP (Pharmacist) on 02/18/24 at 1117  Med List Status: <None>   Medication Order Taking? Sig Documenting Provider Last Dose Status Informant  0.9 %  sodium chloride  infusion [id number]   Armbruster, Elspeth SQUIBB, MD  Consider Medication Status and Discontinue (Completed Course)   albuterol  (VENTOLIN  HFA) 108 (90 Base) MCG/ACT inhaler [id number] Yes TAKE 2 PUFFS BY MOUTH EVERY 6 HOURS AS NEEDED Saguier, Edward, PA-C  Active   aspirin  EC 81 MG EC tablet [id number]  Take 1 tablet (81 mg total) by mouth daily.  Patient not taking: Reported on 02/18/2024   Henry Shaver B, NP  Active Self  atorvastatin  (LIPITOR ) 40 MG tablet [id number] Yes TAKE 1 TABLET EVERY DAY (NEED MD APPOINTMENT FOR REFILLS) Court Dorn PARAS, MD  Active   clopidogrel  (PLAVIX ) 75 MG tablet [id number] Yes TAKE 1 TABLET EVERY DAY Hilty, Vinie BROCKS, MD  Active   dicyclomine  (BENTYL ) 10 MG capsule [id number] Yes TAKE 1 CAPSULE BY MOUTH AS NEEDED FOR UP TO 1 DOSE FOR SPASMS. Legrand Victory LITTIE DOUGLAS, MD  Active   Digestive Enzymes (DIGESTIVE ENZYME PO) [id number]  Take by mouth daily. Patient taking after each meal  Patient not taking: Reported on 02/18/2024   [provider]   Active    Patient not taking:   Discontinued 02/18/24 1110 (Completed Course)   estradiol  (CLIMARA  - DOSED IN MG/24 HR) 0.1 mg/24hr patch [id number] Yes Place 0.1 mg onto the skin every Monday.  Patient taking differently: Place 0.1 mg onto the skin every 14 (fourteen) days.   [provider]  Active Self           Med Note JUSTINO, Zarah Carbon B   Mon Feb 18, 2024 11:10 AM)    Evolocumab  (REPATHA  SURECLICK) 140 MG/ML EMMANUEL [id number] Yes INJECT 1 DOSE INTO THE SKIN EVERY 14 (FOURTEEN) DAYS. Mona Vinie BROCKS, MD  Active   ezetimibe  (ZETIA ) 10 MG tablet [id number] Yes Take 1 tablet (10 mg total) by mouth daily. Lowne Chase, Yvonne R, DO  Active   famotidine  (PEPCID ) 20 MG tablet [id number] Yes Take 1 tablet (20 mg total) by mouth 2 (two) times daily. Lowne Chase, Yvonne R, DO  Active   fluticasone  (FLONASE ) 50 MCG/ACT nasal spray [id number] Yes Place 2 sprays into both nostrils daily.  Patient taking differently: Place 2 sprays into both nostrils daily as needed for rhinitis.   Lowne  Cyndee Jamee SAUNDERS, DO  Active            Med Note EUSTAQUIO, GEORGIA T   Fri Mar 02, 2023 10:51 AM) AS NEEDED.  lisinopril  (ZESTRIL ) 20 MG tablet [id number] Yes TAKE 1 TABLET EVERY DAY Antonio Cyndee, Yvonne R, DO  Active   loratadine  (CLARITIN ) 10 MG tablet [id number] Yes Take 1 tablet (10 mg total) by mouth daily.  Patient taking differently: Take 10 mg by mouth daily as needed for allergies.   Lowne Chase, Yvonne R, DO  Active   LORazepam  (ATIVAN ) 0.5 MG tablet [id number] Yes Take 0.5 mg by mouth at bedtime.  [provider]  Active Self           Med Note MYLO, POWELL CROME   Sat Apr 08, 2016 10:00 AM)      Discontinued 02/18/24 1115 (Completed Course)    Patient not taking:   Discontinued 02/18/24 1115 (Change in therapy)   ondansetron  (ZOFRAN -ODT) 4 MG disintegrating tablet [id number] Yes Take 1 tablet (4 mg total) by mouth every 6 (six) hours as needed for nausea or vomiting. Leigh Elspeth SQUIBB, MD  Active    pantoprazole  (PROTONIX ) 40 MG tablet [id number] Yes TAKE 1 TABLET BY MOUTH EVERY DAY Antonio Cyndee, Yvonne R, DO  Active   polyethylene glycol (MIRALAX ) 17 g packet [id number] Yes Take 17 g by mouth 2 (two) times daily.  Patient taking differently: Take 17 g by mouth daily.   Briana Elgin LABOR, MD  Active   sertraline  (ZOLOFT ) 50 MG tablet [id number] Yes Take 25 mg by mouth at bedtime.  [provider]  Active Self  sucralfate  (CARAFATE ) 1 g tablet [id number]  Take 1 tablet by mouth 3 (three) times daily.  Patient not taking: Reported on 02/18/2024   [provider]  Active   Vitamin D , Ergocalciferol , (DRISDOL ) 1.25 MG (50000 UNIT) CAPS capsule [id number] Yes TAKE 1 CAPSULE EVERY WEEK Lowne Chase, Yvonne R, DO  Active               Assessment/Plan:    Weight Loss / moderate protein-calorie malnutrition:  - Thought patient might need assistance with getting pancreatic enzymes but since last fecal test was WNL for pancreatic elastase, pancreatic enzymes not currently needed.  - Provided 3 cartons of Ensure Max samples. - Signed patient up to received coupons for Ensure products.  - Discussed diet - recommended she eat 3 small meals per day + 2 to 3 snacks. Drink 1 to 2 Ensures per day.   Tobacco Abuse / COPD:  - Discussed smoking cessation as well since nicotine  is associated with low weight. Patient in not ready to quit at this time.  - Discussed treatment option for smoking cessation - patches, gum and / or Chantix.  - Recommended she use albuterol  prior to activity. She will see pulmonologist next week to assess if additional therapy is needed. Recommended she try to garden either early morning or late evening when it is not so hot outside.    Madelin Ray, PharmD Clinical Pharmacist [facility]

## 2024-02-20 ENCOUNTER — Telehealth: Payer: Self-pay | Admitting: Gastroenterology

## 2024-02-20 ENCOUNTER — Telehealth: Payer: Self-pay | Admitting: Cardiovascular Disease

## 2024-02-20 ENCOUNTER — Telehealth: Payer: Self-pay

## 2024-02-20 DIAGNOSIS — R109 Unspecified abdominal pain: Secondary | ICD-10-CM

## 2024-02-20 DIAGNOSIS — I739 Peripheral vascular disease, unspecified: Secondary | ICD-10-CM

## 2024-02-20 DIAGNOSIS — R198 Other specified symptoms and signs involving the digestive system and abdomen: Secondary | ICD-10-CM

## 2024-02-20 DIAGNOSIS — R634 Abnormal weight loss: Secondary | ICD-10-CM

## 2024-02-20 DIAGNOSIS — Z95828 Presence of other vascular implants and grafts: Secondary | ICD-10-CM

## 2024-02-20 NOTE — Telephone Encounter (Signed)
 Pt wants to know if Dr Court wants her to have another Doppler. Last one done in 03/2023

## 2024-02-20 NOTE — Telephone Encounter (Signed)
 Ensure samples were left at front desk of PCP office on 02/18/2024 - patient notified to ask for Ensure at the front desk of Dr Cruz.

## 2024-02-20 NOTE — Telephone Encounter (Signed)
 Left message for patient to call back

## 2024-02-20 NOTE — Telephone Encounter (Signed)
 Patient called and stated that she would like to speak to the nurse in regards to her having colon pain. Patient is requesting a call back. Please advise.

## 2024-02-20 NOTE — Telephone Encounter (Signed)
 Copied from CRM [id number]. Topic: General - Other >> Feb 20, 2024  3:58 PM Macario HERO wrote: Reason for CRM: Patient called said the pharmacist said they would leave Ensure with her provider for her to pick up. Patient is calling to see how she can receive them.

## 2024-02-20 NOTE — Telephone Encounter (Signed)
 Advised patient of recommendations as per Dr Leigh. She verbalizes understanding and will come for labs this week. Also reminded patient of ER precautions to which she states they wont do anything. I've been there before for this and they dont know what to do.

## 2024-02-20 NOTE — Telephone Encounter (Signed)
 Patient is calling back and requesting to speak to the nurse. Patient is requesting a call back. Please advise.

## 2024-02-20 NOTE — Telephone Encounter (Signed)
 Ensure is with the other samples / patient assistance program medications that are left for patients to pick up.

## 2024-02-20 NOTE — Telephone Encounter (Signed)
 Hard to say what is driving this process over the phone.  I looked at her last colonoscopy which did not show any obvious diverticulosis there after her surgery, although recurrent diverticulitis remains possible if she had some small diverticuli.  She had a CT scan a few months ago which looked okay.  I would rather have her on MiraLAX  for the time being to keep her stools looser in case she is having diverticulitis, would hold off on bulking stools with fiber right now although long-term yes that may be a good idea for her.  Can try her on some IBgard to use as needed to see if that will help with cramping.  If she is having fevers or worsening symptoms she should let us  know.  Could do CBC and c-Met to make sure her labs are okay.  If she had a leukocytosis would be more concerned about possible diverticulitis.  If any severe pain she would need to go to the ED.

## 2024-02-20 NOTE — Telephone Encounter (Signed)
 Patient calls stating that over the last 2 days, she has been experiencing left sided abdominal pain that is intermittent, varies in intensity and is described as sharp, crampy and achy. States she normally has issues with constipation but takes miralax  1 capful twice daily and a stool softener which helps her have bowel movements. Says that Miralax  can cause bm to just slide right out of me though. Patient denies any large change in bowels over the last few days. She denies any rectal pain. Denies any fever but does endorse some chills over the last 2 days. No Denies any vomiting but does have loss of appetite (although this appears more chronic).   Patient with hx of sigmoid resection for diverticulitis; noted to have tortuous/redundant colon. Also with history of multiple pelvic surgeries, adhesions and SBOs in the past.  We discussed benefiber supplementation to bulk the stool and we also discussed the importance of adequate water  intake. Patient admits she does not drink a lot of water  because it doesn't settle well on her stomach.  Patient has upcoming follow up 04/14/24  Please advise of any recommendations.SABRASABRA

## 2024-02-21 ENCOUNTER — Other Ambulatory Visit (INDEPENDENT_AMBULATORY_CARE_PROVIDER_SITE_OTHER)

## 2024-02-21 ENCOUNTER — Ambulatory Visit: Payer: Self-pay | Admitting: Gastroenterology

## 2024-02-21 DIAGNOSIS — R634 Abnormal weight loss: Secondary | ICD-10-CM

## 2024-02-21 DIAGNOSIS — M79632 Pain in left forearm: Secondary | ICD-10-CM | POA: Diagnosis not present

## 2024-02-21 DIAGNOSIS — R109 Unspecified abdominal pain: Secondary | ICD-10-CM

## 2024-02-21 DIAGNOSIS — R198 Other specified symptoms and signs involving the digestive system and abdomen: Secondary | ICD-10-CM

## 2024-02-21 DIAGNOSIS — S50862A Insect bite (nonvenomous) of left forearm, initial encounter: Secondary | ICD-10-CM | POA: Diagnosis not present

## 2024-02-21 LAB — CBC WITH DIFFERENTIAL/PLATELET
Basophils Absolute: 0.1 K/uL (ref 0.0–0.1)
Basophils Relative: 1.2 % (ref 0.0–3.0)
Eosinophils Absolute: 0.3 K/uL (ref 0.0–0.7)
Eosinophils Relative: 5.3 % — ABNORMAL HIGH (ref 0.0–5.0)
HCT: 41.8 % (ref 36.0–46.0)
Hemoglobin: 14 g/dL (ref 12.0–15.0)
Lymphocytes Relative: 28.9 % (ref 12.0–46.0)
Lymphs Abs: 1.4 K/uL (ref 0.7–4.0)
MCHC: 33.6 g/dL (ref 30.0–36.0)
MCV: 91.8 fl (ref 78.0–100.0)
Monocytes Absolute: 0.5 K/uL (ref 0.1–1.0)
Monocytes Relative: 9.4 % (ref 3.0–12.0)
Neutro Abs: 2.7 K/uL (ref 1.4–7.7)
Neutrophils Relative %: 55.2 % (ref 43.0–77.0)
Platelets: 180 K/uL (ref 150.0–400.0)
RBC: 4.56 Mil/uL (ref 3.87–5.11)
RDW: 13 % (ref 11.5–15.5)
WBC: 4.8 K/uL (ref 4.0–10.5)

## 2024-02-21 LAB — COMPREHENSIVE METABOLIC PANEL WITH GFR
ALT: 9 U/L (ref 0–35)
AST: 14 U/L (ref 0–37)
Albumin: 4.2 g/dL (ref 3.5–5.2)
Alkaline Phosphatase: 62 U/L (ref 39–117)
BUN: 14 mg/dL (ref 6–23)
CO2: 33 meq/L — ABNORMAL HIGH (ref 19–32)
Calcium: 9.2 mg/dL (ref 8.4–10.5)
Chloride: 102 meq/L (ref 96–112)
Creatinine, Ser: 0.78 mg/dL (ref 0.40–1.20)
GFR: 74.14 mL/min (ref >60.00)
Glucose, Bld: 109 mg/dL — ABNORMAL HIGH (ref 70–99)
Potassium: 4.2 meq/L (ref 3.5–5.1)
Sodium: 140 meq/L (ref 135–145)
Total Bilirubin: 0.4 mg/dL (ref 0.2–1.2)
Total Protein: 6.6 g/dL (ref 6.0–8.3)

## 2024-02-27 ENCOUNTER — Encounter: Payer: Self-pay | Admitting: Acute Care

## 2024-02-27 ENCOUNTER — Ambulatory Visit: Admitting: Acute Care

## 2024-02-27 VITALS — BP 121/73 | HR 74 | Ht 60.0 in | Wt 86.4 lb

## 2024-02-27 DIAGNOSIS — R911 Solitary pulmonary nodule: Secondary | ICD-10-CM | POA: Diagnosis not present

## 2024-02-27 DIAGNOSIS — F17210 Nicotine dependence, cigarettes, uncomplicated: Secondary | ICD-10-CM

## 2024-02-27 DIAGNOSIS — F17200 Nicotine dependence, unspecified, uncomplicated: Secondary | ICD-10-CM

## 2024-02-27 NOTE — Patient Instructions (Addendum)
 It is good to see you today. You have a new 7 mm lung nodule in your right lower lobe. We will do a follow up scan 05/2024 to monitor this nodule for growth. You will follow up with me after the scan to review results. Call if you develop any blood in your sputum or unexplained weight loss.  Please contact office for sooner follow up if symptoms do not improve or worsen or seek emergency care

## 2024-02-27 NOTE — Progress Notes (Signed)
 History of Present Illness Leah [name] is a 78 y.o. female current every day smoker  folowed through the lung cancer screening program.Referred 01/2024  by Dr. Antonio Meth for LR 3 scan.  Pt. Has consented to use of [employer] soft wear to help capture the content of this OV.   02/27/2024 Leah [name] is a 78 year old female current every day smoker  with severe emphysema who presents for a lung nodule consultation for a LR 3 lung cancer screening scan.   Leah [name] has had sporadic lung cancer screening.  Previous screening to have been read as lung RADS 2 which require 23-month annual follow-up.  Most recent low-dose CT was read as a lung RADS 3.  There is a new part solid 0.7 cm posterior right lower lobe nodule with tiny 0.2 cm solid component.  With patient's smoking history this warrants close surveillance.  Per the lung cancer screening guidelines recommendation is for a 42-month follow-up CT to reevaluate for stability versus growth.  This will be due in November 2025.  Order has already been placed and patient understands she will get a call closer to the time to get it scheduled.  She has a family history of cancer, with her brother having had lung cancer, which was treated successfully, and both parents having died from pancreatic cancer.  Patient's father also had history of leukemia and prostate cancer.  Patient's mother also had a history of colon polyps.  Her past medical history includes high blood pressure, high cholesterol, and allergies. She has undergone multiple surgeries including gallbladder, neck and back, colon (for diverticulitis), sinus, hysterectomy, and appendectomy.  She lives alone following the passing of her spouse last year and has three adult children. She worked in [employer], which involved exposure to particulates. She denies recent travel and coughing up blood. She reports unintentional weight loss since retirement seven years ago, losing about  thirty pounds.  She states that this has been evaluated by multiple physicians and definitive etiology has not been determined.  She experiences sinus drainage and occasional shortness of breath, for which she uses Claritin  for allergies.     Test Results: LDCT Chest 12/11/2023 Lung-RADS 3, probably benign findings. Short-term follow-up in 6 months is recommended with repeat low-dose chest CT without contrast (please use the following order, CT CHEST LCS NODULE FOLLOW-UP W/O CM). New part solid 0.7 cm posterior right lower lobe nodule with tiny 0.2 cm solid component.  07/02/2023  CTA Chest Lungs/Pleura: Moderate to severe emphysematous disease with centrilobular changes predominating, small amount of paraseptal disease in the apices.   Asymmetric right apical pleural-parenchymal scar-like opacity is unchanged. Scattered linear scarring in the bases.    LDCT 10/13/2019 Lungs/Pleura: No pneumothorax. No pleural effusion. Moderate to severe centrilobular and paraseptal emphysema with mild diffuse bronchial wall thickening. No acute consolidative airspace disease or lung masses. No significant growth of small scattered solid pulmonary nodules. No new significant pulmonary nodules.  Lung-RADS 2, benign appearance or behavior. Continue annual screening with low-dose chest CT without contrast in 12 months. 2. Three-vessel coronary atherosclerosis. 3. Aortic Atherosclerosis (ICD10-I70.0) and Emphysema   02/13/2018 LDCT Lung-RADS 2S, benign appearance or behavior. Continue annual screening with low-dose chest CT without contrast in 12 months. 2. The S modifier above refers to potentially clinically significant non lung cancer related findings. Specifically, there is aortic atherosclerosis, in addition to left main and 3 vessel coronary artery disease. Please note that although the presence of coronary artery  calcium  documents the presence of coronary artery disease, the severity of  this disease and any potential stenosis cannot be assessed on this non-gated CT examination. Assessment for potential risk factor modification, dietary therapy or pharmacologic therapy may be warranted, if clinically indicated. 3. Mild diffuse bronchial wall thickening with moderate centrilobular and paraseptal emphysema; imaging findings suggestive of underlying COPD. 4. There are calcifications of the aortic valve. Echocardiographic correlation for evaluation of potential valvular dysfunction may be warranted if clinically indicated.   Aortic Atherosclerosis (ICD10-I70.0) and Emphysema (ICD10-J43.9).      Latest Ref Rng & Units 02/21/2024    9:33 AM 11/01/2023   11:07 AM 09/03/2023   12:08 PM  CBC  WBC 4.0 - 10.5 K/uL 4.8  6.4  5.3   Hemoglobin 12.0 - 15.0 g/dL 85.9  86.0  86.0   Hematocrit 36.0 - 46.0 % 41.8  41.4  42.7   Platelets 150.0 - 400.0 K/uL 180.0  213.0  200.0        Latest Ref Rng & Units 02/21/2024    9:33 AM 11/01/2023   11:07 AM 09/03/2023   12:08 PM  BMP  Glucose 70 - 99 mg/dL 890  893  897   BUN 6 - 23 mg/dL 14  11  14    Creatinine 0.40 - 1.20 mg/dL 9.21  9.15  9.24   Sodium 135 - 145 mEq/L 140  140  139   Potassium 3.5 - 5.1 mEq/L 4.2  4.4  4.5   Chloride 96 - 112 mEq/L 102  102  101   CO2 19 - 32 mEq/L 33  31  31   Calcium  8.4 - 10.5 mg/dL 9.2  9.3  9.0     BNP No results found for: BNP  ProBNP No results found for: PROBNP  PFT No results found for: FEV1PRE, FEV1POST, FVCPRE, FVCPOST, TLC, DLCOUNC, PREFEV1FVCRT, PSTFEV1FVCRT  No results found.   Past medical hx Past Medical History:  Diagnosis Date   Allergy    ANEMIA, IRON DEFICIENCY 03/19/2009   Qualifier: Diagnosis of   By: Antonio ROSALEA Rockers         Anxiety    Barrett esophagus    Bladder pain    Cataract    Chronic bronchitis (HCC)    per pt last episode july 2016   Complication of anesthesia    slow to wake   COPD (chronic obstructive pulmonary disease) (HCC)     Depression    Diverticulosis of colon (without mention of hemorrhage) 03/14/2007   Centricity Description: DIVERTICULOSIS, COLON  Qualifier: Diagnosis of   By: Lewellyn CMA (AAMA), Amanda      Centricity Description: DIVERTICULOSIS-COLON  Qualifier: Diagnosis of   By: Ever Riggers, Amy S     Replacing diagnoses that were inactivated after the 10/30/22 regulatory import     Exocrine pancreatic insufficiency    Frequency of urination    GERD (gastroesophageal reflux disease)    Hiatal hernia    History of adenomatous polyp of colon    tubular adenoma   History of chronic gastritis    History of diverticulitis 03/02/2014   History of diverticulitis of colon    History of esophageal dilatation    for stricture   History of esophageal spasm    Hyperlipidemia    Hypertension    IBS 11/13/2007   Qualifier: Diagnosis of   By: Lewellyn CMA (AAMA), Amanda         IBS (irritable bowel syndrome)    Interstitial cystitis  PAD (peripheral artery disease) (HCC)    Partial small bowel obstruction (HCC) 09/04/2019   Productive cough    intermittant   SBO (small bowel obstruction) (HCC) 09/19/2019   Smokers' cough (HCC)    Urgency of urination      Social History   Tobacco Use   Smoking status: Every Day    Current packs/day: 0.50    Average packs/day: 0.7 packs/day for 48.6 years (36.0 ttl pk-yrs)    Types: Cigarettes    Start date: 2024   Smokeless tobacco: Never   Tobacco comments:    Smokes a half a pack per day. 02/27/2024  Vaping Use   Vaping status: Never Used  Substance Use Topics   Alcohol use: No    Alcohol/week: 0.0 standard drinks of alcohol   Drug use: No    Leah [name] reports that she has been smoking cigarettes. She started smoking about 18 months ago. She has a 36 pack-year smoking history. She has never used smokeless tobacco. She reports that she does not drink alcohol and does not use drugs.  Tobacco Cessation: Ready to quit: Not Answered Counseling given: Not  Answered Tobacco comments: Smokes a half a pack per day. 02/27/2024 Current every day smoker , I spent 3-4 minutes counseling patient on  steps to stop use of tobacco products. I have provided patient with information on receiving free nicotine  replacement therapy, and contact numbers for hypnosis for smoking cessation as well as acupuncture for smoking cessation.   Past surgical hx, Family hx, Social hx all reviewed.  Current Outpatient Medications on File Prior to Visit  Medication Sig   albuterol  (VENTOLIN  HFA) 108 (90 Base) MCG/ACT inhaler TAKE 2 PUFFS BY MOUTH EVERY 6 HOURS AS NEEDED   aspirin  EC 81 MG EC tablet Take 1 tablet (81 mg total) by mouth daily.   atorvastatin  (LIPITOR ) 40 MG tablet TAKE 1 TABLET EVERY DAY (NEED MD APPOINTMENT FOR REFILLS)   clopidogrel  (PLAVIX ) 75 MG tablet TAKE 1 TABLET EVERY DAY   dicyclomine  (BENTYL ) 10 MG capsule TAKE 1 CAPSULE BY MOUTH AS NEEDED FOR UP TO 1 DOSE FOR SPASMS.   Digestive Enzymes (DIGESTIVE ENZYME PO) Take by mouth daily. Patient taking after each meal   estradiol  (CLIMARA  - DOSED IN MG/24 HR) 0.1 mg/24hr patch Place 0.1 mg onto the skin every Monday. (Patient taking differently: Place 0.1 mg onto the skin every 14 (fourteen) days.)   Evolocumab  (REPATHA  SURECLICK) 140 MG/ML SOAJ INJECT 1 DOSE INTO THE SKIN EVERY 14 (FOURTEEN) DAYS.   ezetimibe  (ZETIA ) 10 MG tablet Take 1 tablet (10 mg total) by mouth daily.   famotidine  (PEPCID ) 20 MG tablet Take 1 tablet (20 mg total) by mouth 2 (two) times daily.   fluticasone  (FLONASE ) 50 MCG/ACT nasal spray Place 2 sprays into both nostrils daily. (Patient taking differently: Place 2 sprays into both nostrils daily as needed for rhinitis.)   lisinopril  (ZESTRIL ) 20 MG tablet TAKE 1 TABLET EVERY DAY   loratadine  (CLARITIN ) 10 MG tablet Take 1 tablet (10 mg total) by mouth daily. (Patient taking differently: Take 10 mg by mouth daily as needed for allergies.)   LORazepam  (ATIVAN ) 0.5 MG tablet Take 0.5 mg by  mouth at bedtime.    ondansetron  (ZOFRAN -ODT) 4 MG disintegrating tablet Take 1 tablet (4 mg total) by mouth every 6 (six) hours as needed for nausea or vomiting.   pantoprazole  (PROTONIX ) 40 MG tablet TAKE 1 TABLET BY MOUTH EVERY DAY   polyethylene glycol (MIRALAX ) 17 g packet  Take 17 g by mouth 2 (two) times daily. (Patient taking differently: Take 17 g by mouth daily.)   sertraline  (ZOLOFT ) 50 MG tablet Take 25 mg by mouth at bedtime.    sucralfate  (CARAFATE ) 1 g tablet Take 1 tablet by mouth 3 (three) times daily.   Vitamin D , Ergocalciferol , (DRISDOL ) 1.25 MG (50000 UNIT) CAPS capsule TAKE 1 CAPSULE EVERY WEEK   Current Facility-Administered Medications on File Prior to Visit  Medication   0.9 %  sodium chloride  infusion     Allergies  Allergen Reactions   Amoxicillin-Pot Clavulanate Shortness Of Breath, Itching and Swelling   Avelox [Moxifloxacin Hcl In Nacl] Swelling    Swelling, itching and shortness of breath   Ciprofloxacin Shortness Of Breath, Itching and Swelling   Keflex  [Cephalexin ] Shortness Of Breath and Swelling   Penicillins Shortness Of Breath, Itching and Swelling    Did it involve swelling of the face/tongue/throat, SOB, or low BP? Yes Did it involve sudden or severe rash/hives, skin peeling, or any reaction on the inside of your mouth or nose? No Did you need to seek medical attention at a hospital or doctor's office? Yes When did it last happen?      Several years If all above answers are "NO", may proceed with cephalosporin use.    Sulfa Antibiotics Shortness Of Breath, Itching and Swelling   Clindamycin /Lincomycin Itching and Swelling   Levaquin  [Levofloxacin ] Hives   Metronidazole  Other (See Comments)    Pt unsure of reaction   Prednisone  Other (See Comments)    GI irritation; is able to tolerate injections   Tizanidine  Other (See Comments)    Pt unsure of reaction   Tramadol  Itching   Acyclovir And Related Other (See Comments)    Pt does not recall  this reaction   Hydrocodone  Rash   Zyvox  [Linezolid ] Diarrhea    Review Of Systems:  Constitutional:   +  weight loss, No night sweats,  Fevers, chills, fatigue, or  lassitude.  HEENT:   No headaches,  Difficulty swallowing,  Tooth/dental problems, or  Sore throat,                No sneezing, itching, ear ache, nasal congestion, post nasal drip,   CV:  No chest pain,  Orthopnea, PND, swelling in lower extremities, anasarca, dizziness, palpitations, syncope.   GI  No heartburn, indigestion, abdominal pain, nausea, vomiting, diarrhea, change in bowel habits, loss of appetite, bloody stools.   Resp: + shortness of breath with exertion less at rest.  + Baseline excess mucus, + baseline productive cough,  No non-productive cough,  No coughing up of blood.  No change in color of mucus.  + Intermittent wheezing.  No chest wall deformity  Skin: no rash or lesions.  GU: no dysuria, change in color of urine, no urgency or frequency.  No flank pain, no hematuria   MS:  No joint pain or swelling.  No decreased range of motion.  No back pain.  Psych:  No change in mood or affect. No depression or anxiety.  No memory loss.   Vital Signs BP 121/73 (BP Location: Left Arm, Patient Position: Sitting, Cuff Size: Normal)   Pulse 74   Ht 5' (1.524 m)   Wt 86 lb 6.4 oz (39.2 kg)   SpO2 97%   BMI 16.87 kg/m    Physical Exam:  General- No distress,  A&Ox3, pleasant and appropriate ENT: No sinus tenderness, TM clear, pale nasal mucosa, no oral exudate,no post nasal  drip, no LAN Cardiac: S1, S2, regular rate and rhythm, no murmur Chest: No wheeze/ rales/ dullness; no accessory muscle use, no nasal flaring, no sternal retractions, diminished in the bases Abd.: Soft Non-tender, nondistended, bowel sounds positive,Body mass index is 16.87 kg/m.  Ext: No clubbing cyanosis, edema, no obvious deformities Neuro:  normal strength, moving all extremities x 4, alert and oriented x 3 Skin: No rashes, warm  and dry, no obvious skin lesions Psych: normal mood and behavior   Assessment/Plan Abnormal chest imaging and current everyday smoker Low-dose screening CT with new 7 mm right lower lobe pulmonary nodule with a 2 mm solid component Read as a lung RADS 3 Plan 50-month follow-up low-dose CT per protocol Counseled on smoking cessation Follow-up after low-dose CT to review results Call sooner to be seen if you develop blood in your sputum or further unexplained weight loss Please contact office for sooner follow up if symptoms do not improve or worsen or seek emergency care    I spent 20 minutes dedicated to the care of this patient on the date of this encounter to include pre-visit review of records, face-to-face time with the patient discussing conditions above, post visit ordering of testing, clinical documentation with the electronic health record, making appropriate referrals as documented, and communicating necessary information to the patient's healthcare team.    Leah JULIANNA Lites, NP 02/27/2024  11:43 AM

## 2024-03-08 DIAGNOSIS — S50861A Insect bite (nonvenomous) of right forearm, initial encounter: Secondary | ICD-10-CM | POA: Diagnosis not present

## 2024-03-08 DIAGNOSIS — R2231 Localized swelling, mass and lump, right upper limb: Secondary | ICD-10-CM | POA: Diagnosis not present

## 2024-03-19 ENCOUNTER — Encounter (HOSPITAL_BASED_OUTPATIENT_CLINIC_OR_DEPARTMENT_OTHER): Admitting: Nurse Practitioner

## 2024-03-20 ENCOUNTER — Other Ambulatory Visit: Payer: Self-pay | Admitting: Family Medicine

## 2024-03-20 DIAGNOSIS — K219 Gastro-esophageal reflux disease without esophagitis: Secondary | ICD-10-CM

## 2024-03-28 ENCOUNTER — Ambulatory Visit (HOSPITAL_COMMUNITY)
Admission: RE | Admit: 2024-03-28 | Discharge: 2024-03-28 | Disposition: A | Discharge status: Home / Self Care | Source: Ambulatory Visit | Attending: Cardiovascular Disease | Admitting: Cardiovascular Disease

## 2024-03-28 ENCOUNTER — Ambulatory Visit (HOSPITAL_BASED_OUTPATIENT_CLINIC_OR_DEPARTMENT_OTHER)
Admission: RE | Admit: 2024-03-28 | Discharge: 2024-03-28 | Disposition: A | Discharge status: Home / Self Care | Source: Ambulatory Visit | Attending: Cardiovascular Disease | Admitting: Cardiovascular Disease

## 2024-03-28 ENCOUNTER — Ambulatory Visit: Payer: Self-pay | Admitting: Cardiovascular Disease

## 2024-03-28 DIAGNOSIS — I739 Peripheral vascular disease, unspecified: Secondary | ICD-10-CM | POA: Insufficient documentation

## 2024-03-28 DIAGNOSIS — Z95828 Presence of other vascular implants and grafts: Secondary | ICD-10-CM

## 2024-03-30 LAB — VAS US ABI WITH/WO TBI
Left ABI: 1.08
Right ABI: 1.06

## 2024-04-07 ENCOUNTER — Other Ambulatory Visit: Payer: Self-pay | Admitting: Internal Medicine

## 2024-04-10 ENCOUNTER — Other Ambulatory Visit: Payer: Self-pay | Admitting: Family Medicine

## 2024-04-10 DIAGNOSIS — E785 Hyperlipidemia, unspecified: Secondary | ICD-10-CM

## 2024-04-10 DIAGNOSIS — R1013 Epigastric pain: Secondary | ICD-10-CM

## 2024-04-11 ENCOUNTER — Ambulatory Visit (INDEPENDENT_AMBULATORY_CARE_PROVIDER_SITE_OTHER): Admitting: Family Medicine

## 2024-04-11 ENCOUNTER — Encounter: Payer: Self-pay | Admitting: Family Medicine

## 2024-04-11 VITALS — BP 108/60 | HR 68 | Temp 98.0°F | Resp 16 | Ht 60.0 in | Wt 88.8 lb

## 2024-04-11 DIAGNOSIS — J0140 Acute pansinusitis, unspecified: Secondary | ICD-10-CM | POA: Diagnosis not present

## 2024-04-11 DIAGNOSIS — R051 Acute cough: Secondary | ICD-10-CM | POA: Diagnosis not present

## 2024-04-11 LAB — POC COVID19 BINAXNOW: SARS Coronavirus 2 Ag: NEGATIVE

## 2024-04-11 MED ORDER — FLUTICASONE PROPIONATE 50 MCG/ACT NA SUSP: 2.0000 | Freq: Every day | NASAL | 6 refills | Status: AC

## 2024-04-11 MED ORDER — DOXYCYCLINE HYCLATE 100 MG PO TABS: 100.0000 mg | ORAL_TABLET | Freq: Two times a day (BID) | ORAL | 0 refills | Status: DC

## 2024-04-11 MED ORDER — PROMETHAZINE-DM 6.25-15 MG/5ML PO SYRP: 5.0000 mL | ORAL_SOLUTION | Freq: Four times a day (QID) | ORAL | 0 refills | Status: DC | PRN

## 2024-04-11 NOTE — Progress Notes (Signed)
 Subjective:    Patient ID: Leah [name], female    DOB: 1946-07-01, 78 y.o.   MRN: [id number]  Chief Complaint  Patient presents with   Sinus Problem    Sxs started last week, sxs got worse this week, no COVID test. Sinus pressure, productive cough, left ear pain    HPI Patient is in today for sinus congestion.  Discussed the use of AI scribe software for clinical note transcription with the patient, who gave verbal consent to proceed.  History of Present Illness Leah [name] is a 78 year old female who presents with worsening cough and sinus pressure.  Her symptoms began last week with intermittent mild discomfort, progressively worsening over the past week. She describes a persistent cough and sinus pressure, particularly affecting her left ear, which is painful.  She has been using Tylenol  and saline nasal spray for symptom relief but has not used other over-the-counter medications like Flonase . No fever, but she experiences cold chills. She is cautious about COVID-19 and wears a mask.  She has not received her flu shot this season, preferring to recover from her current illness first.    Past Medical History:  Diagnosis Date   Allergy    ANEMIA, IRON DEFICIENCY 03/19/2009   Qualifier: Diagnosis of   By: Antonio ROSALEA Rockers         Anxiety    Barrett esophagus    Bladder pain    Cataract    Chronic bronchitis (HCC)    per pt last episode july 2016   Complication of anesthesia    slow to wake   COPD (chronic obstructive pulmonary disease) (HCC)    Depression    Diverticulosis of colon (without mention of hemorrhage) 03/14/2007   Centricity Description: DIVERTICULOSIS, COLON  Qualifier: Diagnosis of   By: Lewellyn CMA (AAMA), Amanda      Centricity Description: DIVERTICULOSIS-COLON  Qualifier: Diagnosis of   By: Ever Riggers, Amy S     Replacing diagnoses that were inactivated after the 10/30/22 regulatory import     Exocrine pancreatic insufficiency    Frequency  of urination    GERD (gastroesophageal reflux disease)    Hiatal hernia    History of adenomatous polyp of colon    tubular adenoma   History of chronic gastritis    History of diverticulitis 03/02/2014   History of diverticulitis of colon    History of esophageal dilatation    for stricture   History of esophageal spasm    Hyperlipidemia    Hypertension    IBS 11/13/2007   Qualifier: Diagnosis of   By: Lewellyn CMA (AAMA), Amanda         IBS (irritable bowel syndrome)    Interstitial cystitis    PAD (peripheral artery disease) (HCC)    Partial small bowel obstruction (HCC) 09/04/2019   Productive cough    intermittant   SBO (small bowel obstruction) (HCC) 09/19/2019   Smokers' cough (HCC)    Urgency of urination     Past Surgical History:  Procedure Laterality Date   ABDOMINAL AORTOGRAM N/A 10/14/2018   Procedure: ABDOMINAL AORTOGRAM;  Surgeon: Court Dorn PARAS, MD;  Location: [facility];  Service: Cardiovascular;  Laterality: N/A;   ABDOMINAL HYSTERECTOMY  1975   ANTERIOR CERVICAL DECOMP/DISCECTOMY FUSION N/A 09/07/2014   Procedure: Exploration of Fusion and Removal of Anterior Cervical Hematoma;  Surgeon: Reyes Budge, MD;  Location: [facility] NEURO ORS;  Service: Neurosurgery;  Laterality: N/A;   CARPAL TUNNEL RELEASE  Right 1993   COLONOSCOPY  last one 04-30-2013   COLONOSCOPY WITH PROPOFOL  N/A 09/09/2019   Procedure: COLONOSCOPY WITH PROPOFOL ;  Surgeon: Legrand Victory LITTIE DOUGLAS, MD;  Location: [facility]OS[facility];  Service: Gastroenterology;  Laterality: N/A;   CYSTO WITH HYDRODISTENSION N/A 06/08/2015   Procedure: CYSTOSCOPY/HYDRODISTENSION INSTILLATION OF MARCAINE  AND PYRIDIUM ;  Surgeon: Norleen Seltzer, MD;  Location: [facility];  Service: Urology;  Laterality: N/A;   CYSTO/  HOD/  BLADDER BX  1990's   ESOPHAGOGASTRODUODENOSCOPY  last one 09-11-2012   LAPAROSCOPIC APPENDECTOMY N/A 09/21/2013   Procedure: APPENDECTOMY LAPAROSCOPIC;  Surgeon: Donnice Bury, MD;   Location: [facility] OR;  Service: General;  Laterality: N/A;   LAPAROSCOPIC CHOLECYSTECTOMY  2002   LAPAROSCOPY SIGMOID COLECTOMY  10-05-2008   diverticulitis   LOWER EXTREMITY ANGIOGRAPHY Left 10/14/2018   LOWER EXTREMITY ANGIOGRAPHY Bilateral 10/14/2018   Procedure: Lower Extremity Angiography;  Surgeon: Court Dorn PARAS, MD;  Location: [facility] INVASIVE CV LAB;  Service: Cardiovascular;  Laterality: Bilateral;  ILIACS   MICROLARYNGOSCOPY  09-27-2006   w/ true vocal cord stripping  and bilateral bx's of lesion's (benign)   PERIPHERAL VASCULAR INTERVENTION  10/14/2018   Procedure: PERIPHERAL VASCULAR INTERVENTION;  Surgeon: Court Dorn PARAS, MD;  Location: [facility] INVASIVE CV [facility];  Service: Cardiovascular;;   SINUS SURGERY WITH INSTATRAK  1991   TEMPOROMANDIBULAR JOINT SURGERY  1990   TONSILLECTOMY AND ADENOIDECTOMY  1964    Family History  Problem Relation Age of Onset   Pancreatic cancer Mother    Colon polyps Mother    Pancreatic cancer Father    Diabetes Father    Heart disease Father    Leukemia Father    Prostate cancer Father    Hypertension Father    Heart disease Brother    Heart attack Brother    Other Daughter        prediabetes   Heart disease Daughter    Diabetes Daughter    Colon cancer Neg Hx    Esophageal cancer Neg Hx    Rectal cancer Neg Hx    Stomach cancer Neg Hx     Social History   Socioeconomic History   Marital status: Married    Spouse name: Not on file   Number of children: 3   Years of education: Not on file   Highest education level: Not on file  Occupational History   Not on file  Tobacco Use   Smoking status: Every Day    Current packs/day: 0.50    Average packs/day: 0.7 packs/day for 48.7 years (36.1 ttl pk-yrs)    Types: Cigarettes    Start date: 2024   Smokeless tobacco: Never   Tobacco comments:    Smokes a half a pack per day. 02/27/2024  Vaping Use   Vaping status: Never Used  Substance and Sexual Activity   Alcohol use: No     Alcohol/week: 0.0 standard drinks of alcohol   Drug use: No   Sexual activity: Not Currently    Birth control/protection: Post-menopausal  Other Topics Concern   Not on file  Social History Narrative   Not on file   Social Drivers of Health   Financial Resource Strain: Not on file  Food Insecurity: Not on file  Transportation Needs: Not on file  Physical Activity: Not on file  Stress: Not on file  Social Connections: Not on file  Intimate Partner Violence: Not on file    Outpatient Medications Prior to Visit  Medication Sig Dispense Refill  albuterol  (VENTOLIN  HFA) 108 (90 Base) MCG/ACT inhaler TAKE 2 PUFFS BY MOUTH EVERY 6 HOURS AS NEEDED 6.7 each 2   aspirin  EC 81 MG EC tablet Take 1 tablet (81 mg total) by mouth daily.     atorvastatin  (LIPITOR ) 40 MG tablet TAKE 1 TABLET EVERY DAY (NEED MD APPOINTMENT FOR REFILLS) 90 tablet 0   clopidogrel  (PLAVIX ) 75 MG tablet TAKE 1 TABLET EVERY DAY 90 tablet 0   dicyclomine  (BENTYL ) 10 MG capsule TAKE 1 CAPSULE BY MOUTH AS NEEDED FOR UP TO 1 DOSE FOR SPASMS. 90 capsule 0   Digestive Enzymes (DIGESTIVE ENZYME PO) Take by mouth daily. Patient taking after each meal     estradiol  (CLIMARA  - DOSED IN MG/24 HR) 0.1 mg/24hr patch Place 0.1 mg onto the skin every Monday. (Patient taking differently: Place 0.1 mg onto the skin every 14 (fourteen) days.)  0   Evolocumab  (REPATHA  SURECLICK) 140 MG/ML SOAJ INJECT 1 DOSE INTO THE SKIN EVERY 14 (FOURTEEN) DAYS. 6 mL 3   ezetimibe  (ZETIA ) 10 MG tablet Take 1 tablet (10 mg total) by mouth daily. 90 tablet 0   famotidine  (PEPCID ) 20 MG tablet Take 1 tablet (20 mg total) by mouth 2 (two) times daily. 180 tablet 0   fluticasone  (FLONASE ) 50 MCG/ACT nasal spray Place 2 sprays into both nostrils daily. (Patient taking differently: Place 2 sprays into both nostrils daily as needed for rhinitis.) 16 g 6   lisinopril  (ZESTRIL ) 20 MG tablet TAKE 1 TABLET EVERY DAY 90 tablet 1   loratadine  (CLARITIN ) 10 MG tablet  Take 1 tablet (10 mg total) by mouth daily. (Patient taking differently: Take 10 mg by mouth daily as needed for allergies.) 30 tablet 11   LORazepam  (ATIVAN ) 0.5 MG tablet Take 0.5 mg by mouth at bedtime.   5   ondansetron  (ZOFRAN -ODT) 4 MG disintegrating tablet Take 1 tablet (4 mg total) by mouth every 6 (six) hours as needed for nausea or vomiting. 30 tablet 0   pantoprazole  (PROTONIX ) 40 MG tablet Take 1 tablet (40 mg total) by mouth daily. 90 tablet 1   polyethylene glycol (MIRALAX ) 17 g packet Take 17 g by mouth 2 (two) times daily. (Patient taking differently: Take 17 g by mouth daily.) 30 each 0   sertraline  (ZOLOFT ) 50 MG tablet Take 25 mg by mouth at bedtime.      sucralfate  (CARAFATE ) 1 g tablet Take 1 tablet by mouth 3 (three) times daily.     Vitamin D , Ergocalciferol , (DRISDOL ) 1.25 MG (50000 UNIT) CAPS capsule TAKE 1 CAPSULE EVERY WEEK 12 capsule 3   Facility-Administered Medications Prior to Visit  Medication Dose Route Frequency Provider Last Rate Last Admin   0.9 %  sodium chloride  infusion  500 mL Intravenous Once Armbruster, Elspeth SQUIBB, MD        Allergies  Allergen Reactions   Amoxicillin-Pot Clavulanate Shortness Of Breath, Itching and Swelling   Avelox [Moxifloxacin Hcl In Nacl] Swelling    Swelling, itching and shortness of breath   Ciprofloxacin Shortness Of Breath, Itching and Swelling   Keflex  [Cephalexin ] Shortness Of Breath and Swelling   Penicillins Shortness Of Breath, Itching and Swelling    Did it involve swelling of the face/tongue/throat, SOB, or low BP? Yes Did it involve sudden or severe rash/hives, skin peeling, or any reaction on the inside of your mouth or nose? No Did you need to seek medical attention at a hospital or doctor's office? Yes When did it last happen?  Several years If all above answers are "NO", may proceed with cephalosporin use.    Sulfa Antibiotics Shortness Of Breath, Itching and Swelling   Clindamycin /Lincomycin Itching and  Swelling   Levaquin  [Levofloxacin ] Hives   Metronidazole  Other (See Comments)    Pt unsure of reaction   Prednisone  Other (See Comments)    GI irritation; is able to tolerate injections   Tizanidine  Other (See Comments)    Pt unsure of reaction   Tramadol  Itching   Acyclovir And Related Other (See Comments)    Pt does not recall this reaction   Hydrocodone  Rash   Zyvox  [Linezolid ] Diarrhea    Review of Systems  Constitutional:  Negative for fever and malaise/fatigue.  HENT:  Positive for congestion and sinus pain. Negative for sore throat.   Eyes:  Negative for blurred vision.  Respiratory:  Positive for cough and sputum production. Negative for shortness of breath and wheezing.   Cardiovascular:  Negative for chest pain, palpitations and leg swelling.  Gastrointestinal:  Negative for abdominal pain, blood in stool and nausea.  Genitourinary:  Negative for dysuria and frequency.  Musculoskeletal:  Negative for falls.  Skin:  Negative for rash.  Neurological:  Negative for dizziness, loss of consciousness and headaches.  Endo/Heme/Allergies:  Negative for environmental allergies.  Psychiatric/Behavioral:  Negative for depression. The patient is not nervous/anxious.        Objective:    Physical Exam Vitals and nursing note reviewed.  Constitutional:      General: She is not in acute distress.    Appearance: Normal appearance. She is well-developed.  HENT:     Head: Normocephalic and atraumatic.     Right Ear: Tympanic membrane and ear canal normal.     Left Ear: A middle ear effusion is present.     Nose:     Right Sinus: Maxillary sinus tenderness and frontal sinus tenderness present.     Left Sinus: Maxillary sinus tenderness and frontal sinus tenderness present.  Eyes:     General: No scleral icterus.       Right eye: No discharge.        Left eye: No discharge.  Cardiovascular:     Rate and Rhythm: Normal rate and regular rhythm.     Heart sounds: No murmur  heard. Pulmonary:     Effort: Pulmonary effort is normal. No respiratory distress.     Breath sounds: Normal breath sounds.  Musculoskeletal:        General: Normal range of motion.     Cervical back: Normal range of motion and neck supple.     Right lower leg: No edema.     Left lower leg: No edema.  Skin:    General: Skin is warm and dry.  Neurological:     Mental Status: She is alert and oriented to person, place, and time.  Psychiatric:        Mood and Affect: Mood normal.        Behavior: Behavior normal.        Thought Content: Thought content normal.        Judgment: Judgment normal.     BP 108/60 (BP Location: Right Arm, Patient Position: Sitting, Cuff Size: Small)   Pulse 68   Temp 98 F (36.7 C) (Oral)   Resp 16   Ht 5' (1.524 m)   Wt 88 lb 12.8 oz (40.3 kg)   SpO2 99%   BMI 17.34 kg/m  Wt Readings from Last 3 Encounters:  04/11/24 88 lb 12.8 oz (40.3 kg)  02/27/24 86 lb 6.4 oz (39.2 kg)  02/18/24 86 lb (39 kg)    Diabetic Foot Exam - Simple   No data filed    Lab Results  Component Value Date   WBC 4.8 02/21/2024   HGB 14.0 02/21/2024   HCT 41.8 02/21/2024   PLT 180.0 02/21/2024   GLUCOSE 109 (H) 02/21/2024   CHOL 142 09/03/2023   TRIG 105.0 09/03/2023   HDL 46.90 09/03/2023   LDLDIRECT 91.0 01/10/2022   LDLCALC 75 09/03/2023   ALT 9 02/21/2024   AST 14 02/21/2024   NA 140 02/21/2024   K 4.2 02/21/2024   CL 102 02/21/2024   CREATININE 0.78 02/21/2024   BUN 14 02/21/2024   CO2 33 (H) 02/21/2024   TSH 1.44 09/03/2023   INR 1.0 05/21/2009   HGBA1C 6.2 07/05/2023    Lab Results  Component Value Date   TSH 1.44 09/03/2023   Lab Results  Component Value Date   WBC 4.8 02/21/2024   HGB 14.0 02/21/2024   HCT 41.8 02/21/2024   MCV 91.8 02/21/2024   PLT 180.0 02/21/2024   Lab Results  Component Value Date   NA 140 02/21/2024   K 4.2 02/21/2024   CO2 33 (H) 02/21/2024   GLUCOSE 109 (H) 02/21/2024   BUN 14 02/21/2024   CREATININE  0.78 02/21/2024   BILITOT 0.4 02/21/2024   ALKPHOS 62 02/21/2024   AST 14 02/21/2024   ALT 9 02/21/2024   PROT 6.6 02/21/2024   ALBUMIN 4.2 02/21/2024   CALCIUM  9.2 02/21/2024   ANIONGAP 5 09/08/2019   GFR 74.14 02/21/2024   Lab Results  Component Value Date   CHOL 142 09/03/2023   Lab Results  Component Value Date   HDL 46.90 09/03/2023   Lab Results  Component Value Date   LDLCALC 75 09/03/2023   Lab Results  Component Value Date   TRIG 105.0 09/03/2023   Lab Results  Component Value Date   CHOLHDL 3 09/03/2023   Lab Results  Component Value Date   HGBA1C 6.2 07/05/2023       Assessment & Plan:  Acute non-recurrent pansinusitis Assessment & Plan: Abx , flonase  and cough med Ok to con't saline  Covid neg  Return to office as needed   Orders: -     Doxycycline  Hyclate; Take 1 tablet (100 mg total) by mouth 2 (two) times daily.  Dispense: 20 tablet; Refill: 0 -     Fluticasone  Propionate; Place 2 sprays into both nostrils daily.  Dispense: 16 g; Refill: 6 -     Promethazine -DM; Take 5 mLs by mouth 4 (four) times daily as needed.  Dispense: 118 mL; Refill: 0  Acute cough -     POC COVID-19 BinaxNow -     Promethazine -DM; Take 5 mLs by mouth 4 (four) times daily as needed.  Dispense: 118 mL; Refill: 0  Assessment and Plan Assessment & Plan Acute upper respiratory infection with left ear pain and sinus pressure   She has an acute upper respiratory infection with worsening symptoms over the past week, including sinus pressure and left ear pain likely due to fluid accumulation. There is no fever, but she experiences cold chills. She is currently using Tylenol  and saline nasal spray for symptom relief. Recommend over-the-counter Flonase  for sinus pressure relief. Advise against receiving a flu shot while symptomatic. Plan to receive a flu shot next month when symptoms resolve.    Adelaine Roppolo R Lowne  Chase, DO

## 2024-04-11 NOTE — Patient Instructions (Signed)

## 2024-04-11 NOTE — Assessment & Plan Note (Signed)
 Abx , flonase  and cough med Ok to con't saline  Covid neg  Return to office as needed

## 2024-04-14 ENCOUNTER — Ambulatory Visit: Admitting: Gastroenterology

## 2024-04-14 ENCOUNTER — Encounter: Payer: Self-pay | Admitting: Gastroenterology

## 2024-04-14 VITALS — BP 140/66 | HR 76 | Ht 60.0 in | Wt 90.1 lb

## 2024-04-14 DIAGNOSIS — K5909 Other constipation: Secondary | ICD-10-CM

## 2024-04-14 DIAGNOSIS — K5904 Chronic idiopathic constipation: Secondary | ICD-10-CM | POA: Diagnosis not present

## 2024-04-14 DIAGNOSIS — F17210 Nicotine dependence, cigarettes, uncomplicated: Secondary | ICD-10-CM

## 2024-04-14 DIAGNOSIS — R636 Underweight: Secondary | ICD-10-CM | POA: Diagnosis not present

## 2024-04-14 DIAGNOSIS — D132 Benign neoplasm of duodenum: Secondary | ICD-10-CM

## 2024-04-14 DIAGNOSIS — F17200 Nicotine dependence, unspecified, uncomplicated: Secondary | ICD-10-CM

## 2024-04-14 MED ORDER — LINACLOTIDE 72 MCG PO CAPS: 72.0000 ug | ORAL_CAPSULE | Freq: Every day | ORAL | 0 refills | Status: DC

## 2024-04-14 MED ORDER — LINACLOTIDE 145 MCG PO CAPS: 145.0000 ug | ORAL_CAPSULE | Freq: Every day | ORAL | 0 refills | Status: DC

## 2024-04-14 NOTE — Patient Instructions (Addendum)
 We have given you samples of the following medication to take: Linzess  72 mg : Take once daily 30 minutes before a meal   Try for about a week.  If not effective enough, you can increase to Linzess  145 mcg once daily 30 minutes before a meal   Linzess  works best when taken once a day every day, on an empty stomach, at least 30 minutes before your first meal of the day.  When Linzess  is taken daily as directed:  *Constipation relief is typically felt in about a week *IBS-C patients may begin to experience relief from belly pain and overall abdominal symptoms (pain, discomfort, and bloating) in about 1 week,   with symptoms typically improving over 12 weeks.  Diarrhea may occur in the first 2 weeks -keep taking it.  The diarrhea should go away and you should start having normal, complete, full bowel movements. It may be helpful to start treatment when you can be near the comfort of your own bathroom, such as a weekend.   Please follow up in 6 months.  Thank you for entrusting me with your care and for choosing [facility], Dr. Elspeth Naval   _______________________________________________________  If your blood pressure at your visit was 140/90 or greater, please contact your primary care physician to follow up on this.  _______________________________________________________  If you are age 20 or older, your body mass index should be between 23-30. Your Body mass index is 17.6 kg/m. If this is out of the aforementioned range listed, please consider follow up with your Primary Care Provider.  If you are age 42 or younger, your body mass index should be between 19-25. Your Body mass index is 17.6 kg/m. If this is out of the aformentioned range listed, please consider follow up with your Primary Care Provider.   ________________________________________________________  The [facility] providers would like to encourage you to use MYCHART to communicate with providers for  non-urgent requests or questions.  Due to long hold times on the telephone, sending your provider a message by [facility] may be a faster and more efficient way to get a response.  Please allow 48 business hours for a response.  Please remember that this is for non-urgent requests.  _______________________________________________________  [facility] is using a team-based approach to care.  Your team is made up of your doctor and two to three APPS. Our APPS (Nurse Practitioners and Physician Assistants) work with your physician to ensure care continuity for you. They are fully qualified to address your health concerns and develop a treatment plan. They communicate directly with your gastroenterologist to care for you. Seeing the Advanced Practice Practitioners on your physician's team can help you by facilitating care more promptly, often allowing for earlier appointments, access to diagnostic testing, procedures, and other specialty referrals.

## 2024-04-14 NOTE — Progress Notes (Signed)
 HPI :  78 year old female, history of diverticulitis status post surgical resection, chronic underweight, here for a follow-up visit.  Last seen in the office in February of this year, see full discussion from that note.  Recall she is chronically underweight.  Baseline weight has been 120 pounds over time which was a very long time ago she had nadired at 85 pounds when I saw her last time and states she had lost 15 pounds over the past 2 years.  Recall she has had problems with diverticulitis in the past that led to sigmoid resection and chronic constipation.  Her last 2 colonoscopies have been limited by poor bowel preps and incomplete visualization. Recall she has had multiple pelvic surgeries and small bowel obstruction in the past due to adhesions.   At the time when I saw her she has had issues with constipation, using MiraLAX  as needed.  She denied any abdominal pain however and stated she was eating okay without any limitations, she was trying to supplement with Ensure.  Reported a good appetite and no nausea or vomiting.  She had just started on Megace  at the time of her last visit.  Recall she has a history of tobacco use and continues to smoke a pack cigarettes daily.  Her husband passed of liver cancer this past fall and was very stressful for her.  For her weight loss in light of her tobacco use, we screened her with a CT scan abdomen pelvis in March to rule out malignancy.  There was some wall thickening of the antrum noted versus underdistention, otherwise no obvious malignancy.  She also had a CT scanning of her chest by her primary care to screen for lung cancer and she has some nodules noted which are being monitored.  No overt malignancy  I otherwise proceeded with an upper endoscopy in June which did not show any concerning findings.  Remarkable for duodenal adenoma that was removed.  Biopsies negative for H. pylori.  She otherwise had negative serologic testing for celiac disease  as well as biopsies negative for celiac disease and a normal pancreatic fecal elastase.  I have counseled her on tobacco cessation which would be very difficult for her, she continues to smoke.  Labs done in July show no anemia, hemoglobin 14, normal liver enzymes, her albumin is in the fours.  She states she is gained about 5 pounds since have last seen her doing better in this regard.  She is eating 3 meals per day.  She is off all Megace .  She denies any abdominal pains, also using Ensure shakes.  She was referred to a nutritionist but could not afford it so was not seen.  Insurance did not cover it.  Main issue that bothers her is constipation.  She states MiraLAX  does not work well enough and she still is not having good evacuation when it does come out its not formed well.  She has cramping with this.  She takes IBgard which helps.  Recall her last 2 colonoscopies were limited by prep.      EGD August 2022:: The distal esophagus was tortuous due to the hiatal hernia. - A 2 cm hiatal hernia was present. - One benign-appearing, intrinsic moderate stenosis was found at the gastroesophageal junction. This stenosis measured 1.2 cm (inner diameter) x less than one cm (in length). The stenosis was traversed. A TTS dilator was passed through the scope. Dilation with a 16-17- 18 mm balloon dilator was performed to 18 mm.  The dilation site was examined and showed mild mucosal disruption and mild improvement in luminal narrowing. - The stomach was normal. - The cardia and gastric fundus were normal on retroflexion. - The examined duodenum was normal.   It was felt the dysphagia was related to combination of tortuous distal esophagus, hiatal hernia and stricture which was balloon dilated.     Extensive stool testing [facility] 2023 - low pancreatic fecal elastase in the setting of diarrhea - 141     Colonoscopy 09/09/19: - Preparation of the colon was poor. - Patent end-to-side colo-colonic  anastomosis, characterized by healthy appearing mucosa. - Stool in the entire examined colon. - The examination was otherwise normal on direct and retroflexion views. - No specimens collected. No colitis was seen. The shape of the anastomosis accounts for the abnormal appearance on CT scan. The current episode appears to have been a PSBO (now resolved), most likely due to adhesions from multiple prior surgeries and protracted post-operative infectious course after sigmoid resection.     Colonoscopy 05/31/2018: - The digital rectal exam findings include decreased sphincter tone. - A large amount of solid and liquid stool was found in the entire colon. - There was evidence of a prior end-to-side colo-colonic anastomosis in the recto-sigmoid colon. This was patent but tortuous, and was characterized by healthy appearing mucosa, though visualization limited by poor prep. - Retroflexion in the rectum was not performed due to anatomy. - The exam was otherwise without abnormality.     CT abdomen / pelvis 01/12/22: IMPRESSION: 1. No acute findings abdomen pelvis. 2. Post partial sigmoid colectomy without complicating features. 3. Post appendectomy and cholecystectomy as well as hysterectomy. 4.  Aortic Atherosclerosis (ICD10-I70.0).     GES 03/16/22: Normal    CT PE protonocol 07/02/23: IMPRESSION: 1. No evidence of arterial dilatation or embolus. 2. Aortic and coronary artery atherosclerosis. 3. Emphysema. 4. Mild central bronchial thickening without evidence of bronchitis or pneumonia. 5. Hepatic cysts.   US  abdomen 07/04/23: IMPRESSION: 1. No acute process. 2. Status post cholecystectomy.     Celiac labs negative Fecal calprotectin 129 - mildly elevated Pancreatic fecal elastase normal   CT abdomen / pelvis 10/02/23: IMPRESSION: 1. Wall thickening versus underdistention of the gastric antrum, correlate with endoscopy. 2. No pathologically enlarged lymph nodes in the abdomen or  pelvis. 3. Aortic atherosclerosis.   CT chest 12/11/23: IMPRESSION: Lung-RADS 3, probably benign findings. Short-term follow-up in 6 months is recommended with repeat low-dose chest CT without contrast (please use the following order, CT CHEST LCS NODULE FOLLOW-UP W/O CM). New part solid 0.7 cm posterior right lower lobe nodule with tiny 0.2 cm solid component.   Two vessel coronary atherosclerosis.   Aortic Atherosclerosis (ICD10-I70.0) and Emphysema (ICD10-J43.9).    EGD 01/24/24: - Esophagogastric landmarks were identified: the Z-line was found at 36 cm, the gastroesophageal junction was found at 36 cm and the upper extent of the gastric folds was found at 36 cm from the incisors. Findings: - A mild Schatzki ring was found in the distal esophagus, it was widely patent. - The exam of the esophagus was otherwise normal. - Multiple small sessile polyps were found in the gastric fundus and in the gastric body. Likely benign fundic gland polyps. Biopsies were taken with a cold forceps for histology. - There was a prominent fold of the antrum. It was biopsied. The exam of the stomach was otherwise normal. - Biopsies were taken with a cold forceps for Helicobacter pylori testing. - A single  suspected 3 mm flat polyp vs. plaque was found in the second portion of the duodenum. The polyp was removed with a cold biopsy forceps. Resection and retrieval were complete. - The exam of the duodenum was otherwise normal. - Biopsies for histology were taken with a cold forceps for evaluation of celiac disease.  FINAL DIAGNOSIS       1. Surgical [P], duodenal :      - BENIGN SMALL BOWEL MUCOSA WITH NO SIGNIFICANT PATHOLOGIC CHANGES       2. Surgical [P], duodenal abnormality tissue :      - DUODENAL ADENOMA       3. Surgical [P], gastric antrum and gastric body :      - MILD CHRONIC GASTRITIS.      - NEGATIVE FOR H. PYLORI ON H&E STAIN      - NO INTESTINAL METAPLASIA, DYSPLASIA, OR MALIGNANCY.        4. Surgical [P], gastric antrum :      - GASTRIC MUCOSA WITH MILD REACTIVE CHANGES      - NEGATIVE FOR H. PYLORI ON H&E STAIN      - NEGATIVE FOR INTESTINAL METAPLASIA, DYSPLASIA OR MALIGNANCY    Past Medical History:  Diagnosis Date   Allergy    ANEMIA, IRON DEFICIENCY 03/19/2009   Qualifier: Diagnosis of   By: Antonio ROSALEA Rockers         Anxiety    Barrett esophagus    Bladder pain    Cataract    Chronic bronchitis (HCC)    per pt last episode july 2016   Complication of anesthesia    slow to wake   COPD (chronic obstructive pulmonary disease) (HCC)    Depression    Diverticulosis of colon (without mention of hemorrhage) 03/14/2007   Centricity Description: DIVERTICULOSIS, COLON  Qualifier: Diagnosis of   By: Lewellyn CMA (AAMA), Amanda      Centricity Description: DIVERTICULOSIS-COLON  Qualifier: Diagnosis of   By: Ever Riggers, Amy S     Replacing diagnoses that were inactivated after the 10/30/22 regulatory import     Exocrine pancreatic insufficiency    Frequency of urination    GERD (gastroesophageal reflux disease)    Hiatal hernia    History of adenomatous polyp of colon    tubular adenoma   History of chronic gastritis    History of diverticulitis 03/02/2014   History of diverticulitis of colon    History of esophageal dilatation    for stricture   History of esophageal spasm    Hyperlipidemia    Hypertension    IBS 11/13/2007   Qualifier: Diagnosis of   By: Lewellyn CMA (AAMA), Amanda         IBS (irritable bowel syndrome)    Interstitial cystitis    PAD (peripheral artery disease) (HCC)    Partial small bowel obstruction (HCC) 09/04/2019   Productive cough    intermittant   SBO (small bowel obstruction) (HCC) 09/19/2019   Smokers' cough (HCC)    Urgency of urination      Past Surgical History:  Procedure Laterality Date   ABDOMINAL AORTOGRAM N/A 10/14/2018   Procedure: ABDOMINAL AORTOGRAM;  Surgeon: Court Dorn PARAS, MD;  Location: [facility] INVASIVE CV [facility];   Service: Cardiovascular;  Laterality: N/A;   ABDOMINAL HYSTERECTOMY  1975   ANTERIOR CERVICAL DECOMP/DISCECTOMY FUSION N/A 09/07/2014   Procedure: Exploration of Fusion and Removal of Anterior Cervical Hematoma;  Surgeon: Reyes Budge, MD;  Location: [facility] NEURO ORS;  Service: Neurosurgery;  Laterality: N/A;   CARPAL TUNNEL RELEASE Right 1993   COLONOSCOPY  last one 04-30-2013   COLONOSCOPY WITH PROPOFOL  N/A 09/09/2019   Procedure: COLONOSCOPY WITH PROPOFOL ;  Surgeon: Legrand Victory LITTIE DOUGLAS, MD;  Location: [facility] ENDOSCOPY;  Service: Gastroenterology;  Laterality: N/A;   CYSTO WITH HYDRODISTENSION N/A 06/08/2015   Procedure: CYSTOSCOPY/HYDRODISTENSION INSTILLATION OF MARCAINE  AND PYRIDIUM ;  Surgeon: Norleen Seltzer, MD;  Location: [facility] [place];  Service: Urology;  Laterality: N/A;   CYSTO/  HOD/  BLADDER BX  1990's   ESOPHAGOGASTRODUODENOSCOPY  last one 09-11-2012   LAPAROSCOPIC APPENDECTOMY N/A 09/21/2013   Procedure: APPENDECTOMY LAPAROSCOPIC;  Surgeon: Donnice Bury, MD;  Location: [facility] OR;  Service: General;  Laterality: N/A;   LAPAROSCOPIC CHOLECYSTECTOMY  2002   LAPAROSCOPY SIGMOID COLECTOMY  10-05-2008   diverticulitis   LOWER EXTREMITY ANGIOGRAPHY Left 10/14/2018   LOWER EXTREMITY ANGIOGRAPHY Bilateral 10/14/2018   Procedure: Lower Extremity Angiography;  Surgeon: Court Dorn PARAS, MD;  Location: [facility] INVASIVE CV LAB;  Service: Cardiovascular;  Laterality: Bilateral;  ILIACS   MICROLARYNGOSCOPY  09-27-2006   w/ true vocal cord stripping  and bilateral bx's of lesion's (benign)   PERIPHERAL VASCULAR INTERVENTION  10/14/2018   Procedure: PERIPHERAL VASCULAR INTERVENTION;  Surgeon: Court Dorn PARAS, MD;  Location: [facility] INVASIVE CV LAB;  Service: Cardiovascular;;   SINUS SURGERY WITH INSTATRAK  1991   TEMPOROMANDIBULAR JOINT SURGERY  1990   TONSILLECTOMY AND ADENOIDECTOMY  1964   Family History  Problem Relation Age of Onset   Pancreatic cancer Mother    Colon polyps Mother    Pancreatic  cancer Father    Diabetes Father    Heart disease Father    Leukemia Father    Prostate cancer Father    Hypertension Father    Heart disease Brother    Heart attack Brother    Other Daughter        prediabetes   Heart disease Daughter    Diabetes Daughter    Colon cancer Neg Hx    Esophageal cancer Neg Hx    Rectal cancer Neg Hx    Stomach cancer Neg Hx    Social History   Tobacco Use   Smoking status: Every Day    Current packs/day: 0.50    Average packs/day: 0.7 packs/day for 48.7 years (36.1 ttl pk-yrs)    Types: Cigarettes    Start date: 2024   Smokeless tobacco: Never   Tobacco comments:    Smokes a half a pack per day. 02/27/2024  Vaping Use   Vaping status: Never Used  Substance Use Topics   Alcohol use: No    Alcohol/week: 0.0 standard drinks of alcohol   Drug use: No   Current Outpatient Medications  Medication Sig Dispense Refill   albuterol  (VENTOLIN  HFA) 108 (90 Base) MCG/ACT inhaler TAKE 2 PUFFS BY MOUTH EVERY 6 HOURS AS NEEDED 6.7 each 2   aspirin  EC 81 MG EC tablet Take 1 tablet (81 mg total) by mouth daily.     atorvastatin  (LIPITOR ) 40 MG tablet TAKE 1 TABLET EVERY DAY (NEED MD APPOINTMENT FOR REFILLS) 90 tablet 0   clopidogrel  (PLAVIX ) 75 MG tablet TAKE 1 TABLET EVERY DAY 90 tablet 0   dicyclomine  (BENTYL ) 10 MG capsule TAKE 1 CAPSULE BY MOUTH AS NEEDED FOR UP TO 1 DOSE FOR SPASMS. 90 capsule 0   Digestive Enzymes (DIGESTIVE ENZYME PO) Take by mouth daily. Patient taking after each meal     doxycycline  (VIBRA -TABS) 100 MG tablet Take 1  tablet (100 mg total) by mouth 2 (two) times daily. 20 tablet 0   estradiol  (CLIMARA  - DOSED IN MG/24 HR) 0.1 mg/24hr patch Place 0.1 mg onto the skin every Monday.  0   Evolocumab  (REPATHA  SURECLICK) 140 MG/ML SOAJ INJECT 1 DOSE INTO THE SKIN EVERY 14 (FOURTEEN) DAYS. 6 mL 3   ezetimibe  (ZETIA ) 10 MG tablet Take 1 tablet (10 mg total) by mouth daily. 90 tablet 0   famotidine  (PEPCID ) 20 MG tablet Take 1 tablet (20 mg  total) by mouth 2 (two) times daily. 180 tablet 0   fluticasone  (FLONASE ) 50 MCG/ACT nasal spray Place 2 sprays into both nostrils daily. 16 g 6   fluticasone  (FLONASE ) 50 MCG/ACT nasal spray Place 2 sprays into both nostrils daily. 16 g 6   lisinopril  (ZESTRIL ) 20 MG tablet TAKE 1 TABLET EVERY DAY 90 tablet 1   loratadine  (CLARITIN ) 10 MG tablet Take 1 tablet (10 mg total) by mouth daily. 30 tablet 11   LORazepam  (ATIVAN ) 0.5 MG tablet Take 0.5 mg by mouth at bedtime.   5   ondansetron  (ZOFRAN -ODT) 4 MG disintegrating tablet Take 1 tablet (4 mg total) by mouth every 6 (six) hours as needed for nausea or vomiting. 30 tablet 0   pantoprazole  (PROTONIX ) 40 MG tablet Take 1 tablet (40 mg total) by mouth daily. 90 tablet 1   polyethylene glycol (MIRALAX ) 17 g packet Take 17 g by mouth 2 (two) times daily. 30 each 0   promethazine -dextromethorphan (PROMETHAZINE -DM) 6.25-15 MG/5ML syrup Take 5 mLs by mouth 4 (four) times daily as needed. 118 mL 0   sertraline  (ZOLOFT ) 50 MG tablet Take 25 mg by mouth at bedtime.      sucralfate  (CARAFATE ) 1 g tablet Take 1 tablet by mouth 3 (three) times daily.     Vitamin D , Ergocalciferol , (DRISDOL ) 1.25 MG (50000 UNIT) CAPS capsule TAKE 1 CAPSULE EVERY WEEK 12 capsule 3   Current Facility-Administered Medications  Medication Dose Route Frequency Provider Last Rate Last Admin   0.9 %  sodium chloride  infusion  500 mL Intravenous Once Julyssa Kyer, Elspeth SQUIBB, MD       Allergies  Allergen Reactions   Amoxicillin-Pot Clavulanate Shortness Of Breath, Itching and Swelling   Avelox [Moxifloxacin Hcl In Nacl] Swelling    Swelling, itching and shortness of breath   Ciprofloxacin Shortness Of Breath, Itching and Swelling   Keflex  Candido.Canavan ] Shortness Of Breath and Swelling   Penicillins Shortness Of Breath, Itching and Swelling    Did it involve swelling of the face/tongue/throat, SOB, or low BP? Yes Did it involve sudden or severe rash/hives, skin peeling, or any  reaction on the inside of your mouth or nose? No Did you need to seek medical attention at a hospital or doctor's office? Yes When did it last happen?      Several years If all above answers are "NO", may proceed with cephalosporin use.    Sulfa Antibiotics Shortness Of Breath, Itching and Swelling   Clindamycin /Lincomycin Itching and Swelling   Levaquin  [Levofloxacin ] Hives   Metronidazole  Other (See Comments)    Pt unsure of reaction   Prednisone  Other (See Comments)    GI irritation; is able to tolerate injections   Tizanidine  Other (See Comments)    Pt unsure of reaction   Tramadol  Itching   Acyclovir And Related Other (See Comments)    Pt does not recall this reaction   Hydrocodone  Rash   Zyvox  [Linezolid ] Diarrhea     Review of Systems:  All systems reviewed and negative except where noted in HPI.   Lab Results  Component Value Date   WBC 4.8 02/21/2024   HGB 14.0 02/21/2024   HCT 41.8 02/21/2024   MCV 91.8 02/21/2024   PLT 180.0 02/21/2024    Lab Results  Component Value Date   NA 140 02/21/2024   CL 102 02/21/2024   K 4.2 02/21/2024   CO2 33 (H) 02/21/2024   BUN 14 02/21/2024   CREATININE 0.78 02/21/2024   GFR 74.14 02/21/2024   CALCIUM  9.2 02/21/2024   ALBUMIN 4.2 02/21/2024   GLUCOSE 109 (H) 02/21/2024    Lab Results  Component Value Date   ALT 9 02/21/2024   AST 14 02/21/2024   ALKPHOS 62 02/21/2024   BILITOT 0.4 02/21/2024     Physical Exam: BP (!) 140/66 (BP Location: Left Arm, Patient Position: Sitting, Cuff Size: Normal)   Pulse 76   Ht 5' (1.524 m)   Wt 90 lb 2 oz (40.9 kg)   BMI 17.60 kg/m  Constitutional: Pleasant,well-developed, female in no acute distress. Neurological: Alert and oriented to person place and time. Psychiatric: Normal mood and affect. Behavior is normal.   ASSESSMENT: 78 y.o. female here for assessment of the following  1. Underweight   2. Tobacco use disorder   3. Chronic constipation   4. Duodenal adenoma     Chronically underweight over many years.  She had nadir down to 85 pounds at her last visit.  She has since undergone a CT scan chest abdomen pelvis without any overt malignancy but she does have some pulmonary nodules that are being followed.  There was a question of thickening of her stomach versus underdistention.  EGD did not show any concerning findings.  She incidentally had a small duodenal adenoma removed.  She is eating well, I really do think she would benefit from calorie counting and seeing a nutritionist however she has declined this due to cost.  She has gained about 5 pounds since have last seen her, she is using Ensure to supplement her meals.  Would continue doing this.  Her appetite is good and she is off Megace , do not think she needs that right now.  The only exam I can see that she is due for otherwise would be a colonoscopy.  This has been very challenging for her, requiring double preps which have still not worked to clear her colon in the past.  Her bowels continue to bother her and she remains constipated despite MiraLAX .  Discussed some other options we will try her on some Linzess , samples given of both 72 mcg and 145 mcg/day.  She should try these initially and see how she does.  Will give prescription if they benefit her.  We discussed colonoscopy, and if she thought she wanted to pursue that, this is technically a challenging exam with difficult prep for her.  She does not think she can handle it now but will consider it based on her course.  Otherwise again discussed tobacco cessation.  I think she would probably gain weight if she stopped smoking and discussed how this is common.  She understands risks to her health with continued tobacco use but is not interested in quitting, at least now.   PLAN: - continue 3 meals per day with ensure supplementation on top of that - declines nutritionist due to cost , not covered by insurance - discussed possible colonoscopy. CT  looks okay. She wishes to have her bowels better prior  to considering colonoscopy - trial of Linzess  - samples of / day and 145mcg, call for prescription - EGD in one year for duodenal adenoma - tobacco cessation recommended  I spent 35 minutes of time, including in depth chart review,  face-to-face time with the patient, and documentation.   Marcey Naval, MD [facility]

## 2024-04-17 ENCOUNTER — Ambulatory Visit: Attending: Internal Medicine | Admitting: Internal Medicine

## 2024-04-17 VITALS — BP 162/69 | HR 72 | Ht 60.0 in | Wt 89.0 lb

## 2024-04-17 DIAGNOSIS — I10 Essential (primary) hypertension: Secondary | ICD-10-CM

## 2024-04-17 DIAGNOSIS — Z716 Tobacco abuse counseling: Secondary | ICD-10-CM | POA: Diagnosis not present

## 2024-04-17 DIAGNOSIS — I739 Peripheral vascular disease, unspecified: Secondary | ICD-10-CM | POA: Diagnosis not present

## 2024-04-17 DIAGNOSIS — E785 Hyperlipidemia, unspecified: Secondary | ICD-10-CM | POA: Diagnosis not present

## 2024-04-17 DIAGNOSIS — E7849 Other hyperlipidemia: Secondary | ICD-10-CM | POA: Diagnosis not present

## 2024-04-17 NOTE — Progress Notes (Signed)
 LIPID CLINIC CONSULT NOTE  Chief Complaint:  Follow-up FH  Primary Care Physician: Leah Meth, Jamee SAUNDERS, DO  HPI:  Leah [name] is a 78 y.o. female who is being seen today for the evaluation of high triglycerides at the request of Leah [name], *. This is a pleasant 78 year old female with multiple cardiovascular risk factors.  She was referred today for elevated triglycerides however also has an high LDL cholesterol.  Her most recent lipid profile demonstrates total cholesterol 223, triglycerides 182, HDL 35 and LDL of 152.  This is in the setting of treatment with high potency atorvastatin  80 mg daily and ezetimibe  10 mg daily.  The combined reduction of these medications is about 80 to 90% suggesting that her LDL is more likely 250-300.  This is a finding highly suggestive of familial hyperlipidemia.  In fact, there is evidence for familial hyperlipidemia.  Her mother had significantly elevated cholesterol.  Her father also had coronary disease and had coronary artery bypass grafting.  She has a daughter who has significant PAD and high cholesterol and she also has sister and a brother both of which you have high cholesterol and heart problems.  She has 2 grandparents on her mother side who both had heart attack.  In addition she is a smoker between 1/2 pack to a full pack per day.  She also has some history of PAD and was found to have moderate nonobstructive plaque in the left lower extremity and has been reporting some symptoms of claudication.  In addition she has known coronary disease.  She is not had a prior MI, however had a screening CT scan of the chest in July 2019 to rule out cancer given her smoking history.  This demonstrated multivessel and left main coronary artery calcification, significant aortic atherosclerosis and other findings suggestive of cardiovascular disease.  She is not on aspirin  due to a history of stomach upset.  Other cardiac vascular risk factors  include hypertension on lisinopril .  Currently she reports dyspnea on exertion but denies chest pain.  She also has had some fatigue.  10/17/2019  [name] returns today for follow-up.  Her lipids have declined significantly.  Most recently her total cholesterol was 75, triglycerides 136, HDL 46 and LDL of 2.  This is similar to her numbers about 9 months ago.  She has not had a dose adjustment in her medications.  By convention we tend to not try to overtreat her with a extremely low LDL cholesterol.  She still should be on a high potency statin and therefore would recommend decreasing her Lipitor  from 80 to 40 mg daily.  Ultimately we may be able to discontinue her ezetimibe  due to excellent response to the Repatha .  05/05/2021  [name] returns today for follow-up.  Overall she seems to be doing very well on a combination of atorvastatin , ezetimibe  and Repatha .  Her recent cholesterol in May showed total 134, HDL 46, LDL 64 triglycerides 137.  She denies any symptomatic claudication.  Unfortunately she continues to smoke but is try to cut back.  She is currently about 1/2 pack/day smoker.  03/02/2023  [name] is seen today in follow-up of her dyslipidemia.  Unfortunately she continues to smoke and is under some stress with her husband who has health issues.  Blood pressure was elevated today however she said she had not taken her medicines this morning.  Her lipids remain mildly elevated with an LDL particle #1091 and LDL-C  of 96, HDL 47 and triglycerides 116.  However, this does represent a marked reduction in her cholesterol as she is on triple therapy with ezetimibe , Repatha  and atorvastatin .  04/17/2024  [name] seen today for follow-up for her lipids.  She has been on the Repatha , statin and ezetimibe .  Her recent lipids in February showed further improvement with LDL particle #791, LDL 77 (down from 96), HDL 42 and triglycerides 88.  Overall this showed good control.  She is  overdue for follow-up with Dr. Wadie but did have repeat carotid Dopplers which I reviewed with her today and they are stable.  Blood pressure initially was read elevated but she does have a high chronic cough sound.  Repeat blood pressure I took manually was 110/68.  PMHx:  Past Medical History:  Diagnosis Date   Allergy    ANEMIA, IRON DEFICIENCY 03/19/2009   Qualifier: Diagnosis of   By: Leah [name]         Anxiety    Barrett esophagus    Bladder pain    Cataract    Chronic bronchitis (HCC)    per pt last episode july 2016   Complication of anesthesia    slow to wake   COPD (chronic obstructive pulmonary disease) (HCC)    Depression    Diverticulosis of colon (without mention of hemorrhage) 03/14/2007   Centricity Description: DIVERTICULOSIS, COLON  Qualifier: Diagnosis of   By: Lewellyn CMA (AAMA), Amanda      Centricity Description: DIVERTICULOSIS-COLON  Qualifier: Diagnosis of   By: Ever Riggers, Amy S     Replacing diagnoses that were inactivated after the 10/30/22 regulatory import     Exocrine pancreatic insufficiency    Frequency of urination    GERD (gastroesophageal reflux disease)    Hiatal hernia    History of adenomatous polyp of colon    tubular adenoma   History of chronic gastritis    History of diverticulitis 03/02/2014   History of diverticulitis of colon    History of esophageal dilatation    for stricture   History of esophageal spasm    Hyperlipidemia    Hypertension    IBS 11/13/2007   Qualifier: Diagnosis of   By: Lewellyn CMA (AAMA), Amanda         IBS (irritable bowel syndrome)    Interstitial cystitis    PAD (peripheral artery disease) (HCC)    Partial small bowel obstruction (HCC) 09/04/2019   Productive cough    intermittant   SBO (small bowel obstruction) (HCC) 09/19/2019   Smokers' cough (HCC)    Urgency of urination     Past Surgical History:  Procedure Laterality Date   ABDOMINAL AORTOGRAM N/A 10/14/2018   Procedure: ABDOMINAL  AORTOGRAM;  Surgeon: Court Dorn PARAS, MD;  Location: [facility] INVASIVE CV LAB;  Service: Cardiovascular;  Laterality: N/A;   ABDOMINAL HYSTERECTOMY  1975   ANTERIOR CERVICAL DECOMP/DISCECTOMY FUSION N/A 09/07/2014   Procedure: Exploration of Fusion and Removal of Anterior Cervical Hematoma;  Surgeon: Reyes Budge, MD;  Location: [facility] NEURO ORS;  Service: Neurosurgery;  Laterality: N/A;   CARPAL TUNNEL RELEASE Right 1993   COLONOSCOPY  last one 04-30-2013   COLONOSCOPY WITH PROPOFOL  N/A 09/09/2019   Procedure: COLONOSCOPY WITH PROPOFOL ;  Surgeon: Legrand Victory LITTIE DOUGLAS, MD;  Location: [facility];  Service: Gastroenterology;  Laterality: N/A;   CYSTO WITH HYDRODISTENSION N/A 06/08/2015   Procedure: CYSTOSCOPY/HYDRODISTENSION INSTILLATION OF MARCAINE  AND PYRIDIUM ;  Surgeon: Norleen Seltzer, MD;  Location: [facility];  Service: Urology;  Laterality: N/A;   CYSTO/  HOD/  BLADDER BX  1990's   ESOPHAGOGASTRODUODENOSCOPY  last one 09-11-2012   LAPAROSCOPIC APPENDECTOMY N/A 09/21/2013   Procedure: APPENDECTOMY LAPAROSCOPIC;  Surgeon: Donnice Bury, MD;  Location: [facility] OR;  Service: General;  Laterality: N/A;   LAPAROSCOPIC CHOLECYSTECTOMY  2002   LAPAROSCOPY SIGMOID COLECTOMY  10-05-2008   diverticulitis   LOWER EXTREMITY ANGIOGRAPHY Left 10/14/2018   LOWER EXTREMITY ANGIOGRAPHY Bilateral 10/14/2018   Procedure: Lower Extremity Angiography;  Surgeon: Court Dorn PARAS, MD;  Location: [facility] INVASIVE CV LAB;  Service: Cardiovascular;  Laterality: Bilateral;  ILIACS   MICROLARYNGOSCOPY  09-27-2006   w/ true vocal cord stripping  and bilateral bx's of lesion's (benign)   PERIPHERAL VASCULAR INTERVENTION  10/14/2018   Procedure: PERIPHERAL VASCULAR INTERVENTION;  Surgeon: Court Dorn PARAS, MD;  Location: [facility] INVASIVE CV LAB;  Service: Cardiovascular;;   SINUS SURGERY WITH INSTATRAK  1991   TEMPOROMANDIBULAR JOINT SURGERY  1990   TONSILLECTOMY AND ADENOIDECTOMY  1964    FAMHx:  Family History  Problem  Relation Age of Onset   Pancreatic cancer Mother    Colon polyps Mother    Pancreatic cancer Father    Diabetes Father    Heart disease Father    Leukemia Father    Prostate cancer Father    Hypertension Father    Heart disease Brother    Heart attack Brother    Other Daughter        prediabetes   Heart disease Daughter    Diabetes Daughter    Colon cancer Neg Hx    Esophageal cancer Neg Hx    Rectal cancer Neg Hx    Stomach cancer Neg Hx     SOCHx:   reports that she has been smoking cigarettes. She started smoking about 20 months ago. She has a 36.1 pack-year smoking history. She has never used smokeless tobacco. She reports that she does not drink alcohol and does not use drugs.  ALLERGIES:  Allergies  Allergen Reactions   Amoxicillin-Pot Clavulanate Shortness Of Breath, Itching and Swelling   Avelox [Moxifloxacin Hcl In Nacl] Swelling    Swelling, itching and shortness of breath   Ciprofloxacin Shortness Of Breath, Itching and Swelling   Keflex  [Cephalexin ] Shortness Of Breath and Swelling   Penicillins Shortness Of Breath, Itching and Swelling    Did it involve swelling of the face/tongue/throat, SOB, or low BP? Yes Did it involve sudden or severe rash/hives, skin peeling, or any reaction on the inside of your mouth or nose? No Did you need to seek medical attention at a hospital or doctor's office? Yes When did it last happen?      Several years If all above answers are "NO", may proceed with cephalosporin use.    Sulfa Antibiotics Shortness Of Breath, Itching and Swelling   Clindamycin /Lincomycin Itching and Swelling   Levaquin  [Levofloxacin ] Hives   Metronidazole  Other (See Comments)    Pt unsure of reaction   Prednisone  Other (See Comments)    GI irritation; is able to tolerate injections   Tizanidine  Other (See Comments)    Pt unsure of reaction   Tramadol  Itching   Acyclovir And Related Other (See Comments)    Pt does not recall this reaction    Hydrocodone  Rash   Zyvox  [Linezolid ] Diarrhea    ROS: Pertinent items noted in HPI and remainder of comprehensive ROS otherwise negative.  HOME MEDS: Current Outpatient Medications on File Prior to Visit  Medication Sig  Dispense Refill   albuterol  (VENTOLIN  HFA) 108 (90 Base) MCG/ACT inhaler TAKE 2 PUFFS BY MOUTH EVERY 6 HOURS AS NEEDED 6.7 each 2   aspirin  EC 81 MG EC tablet Take 1 tablet (81 mg total) by mouth daily.     atorvastatin  (LIPITOR ) 40 MG tablet TAKE 1 TABLET EVERY DAY (NEED MD APPOINTMENT FOR REFILLS) 90 tablet 0   clopidogrel  (PLAVIX ) 75 MG tablet TAKE 1 TABLET EVERY DAY 90 tablet 0   dicyclomine  (BENTYL ) 10 MG capsule TAKE 1 CAPSULE BY MOUTH AS NEEDED FOR UP TO 1 DOSE FOR SPASMS. 90 capsule 0   Digestive Enzymes (DIGESTIVE ENZYME PO) Take by mouth daily. Patient taking after each meal     doxycycline  (VIBRA -TABS) 100 MG tablet Take 1 tablet (100 mg total) by mouth 2 (two) times daily. 20 tablet 0   estradiol  (CLIMARA  - DOSED IN MG/24 HR) 0.1 mg/24hr patch Place 0.1 mg onto the skin every Monday.  0   Evolocumab  (REPATHA  SURECLICK) 140 MG/ML SOAJ INJECT 1 DOSE INTO THE SKIN EVERY 14 (FOURTEEN) DAYS. 6 mL 3   ezetimibe  (ZETIA ) 10 MG tablet Take 1 tablet (10 mg total) by mouth daily. 90 tablet 0   famotidine  (PEPCID ) 20 MG tablet Take 1 tablet (20 mg total) by mouth 2 (two) times daily. 180 tablet 0   fluticasone  (FLONASE ) 50 MCG/ACT nasal spray Place 2 sprays into both nostrils daily. 16 g 6   linaclotide  (LINZESS ) 145 MCG CAPS capsule Take 1 capsule (145 mcg total) by mouth daily before breakfast. Lot: [id number], exp: 09-2024 12 capsule 0   lisinopril  (ZESTRIL ) 20 MG tablet TAKE 1 TABLET EVERY DAY 90 tablet 1   loratadine  (CLARITIN ) 10 MG tablet Take 1 tablet (10 mg total) by mouth daily. 30 tablet 11   LORazepam  (ATIVAN ) 0.5 MG tablet Take 0.5 mg by mouth at bedtime.   5   ondansetron  (ZOFRAN -ODT) 4 MG disintegrating tablet Take 1 tablet (4 mg total) by mouth every 6 (six)  hours as needed for nausea or vomiting. 30 tablet 0   pantoprazole  (PROTONIX ) 40 MG tablet Take 1 tablet (40 mg total) by mouth daily. 90 tablet 1   promethazine -dextromethorphan (PROMETHAZINE -DM) 6.25-15 MG/5ML syrup Take 5 mLs by mouth 4 (four) times daily as needed. 118 mL 0   sertraline  (ZOLOFT ) 50 MG tablet Take 25 mg by mouth at bedtime.      sucralfate  (CARAFATE ) 1 g tablet Take 1 tablet by mouth 3 (three) times daily.     Vitamin D , Ergocalciferol , (DRISDOL ) 1.25 MG (50000 UNIT) CAPS capsule TAKE 1 CAPSULE EVERY WEEK 12 capsule 3   fluticasone  (FLONASE ) 50 MCG/ACT nasal spray Place 2 sprays into both nostrils daily. (Patient not taking: Reported on 04/17/2024) 16 g 6   linaclotide  (LINZESS ) 72 MCG capsule Take 1 capsule (72 mcg total) by mouth daily before breakfast. Lot: [id number], exp: 07-2026 (Patient not taking: Reported on 04/17/2024) 12 capsule 0   polyethylene glycol (MIRALAX ) 17 g packet Take 17 g by mouth 2 (two) times daily. (Patient not taking: Reported on 04/17/2024) 30 each 0   Current Facility-Administered Medications on File Prior to Visit  Medication Dose Route Frequency Provider Last Rate Last Admin   0.9 %  sodium chloride  infusion  500 mL Intravenous Once Armbruster, Elspeth SQUIBB, MD        LABS/IMAGING: No results found for this or any previous visit (from the past 48 hours). No results found.  LIPID PANEL:    Component Value Date/Time  CHOL 142 09/03/2023 1208   CHOL 134 12/09/2020 1120   TRIG 105.0 09/03/2023 1208   HDL 46.90 09/03/2023 1208   HDL 46 12/09/2020 1120   CHOLHDL 3 09/03/2023 1208   VLDL 21.0 09/03/2023 1208   LDLCALC 75 09/03/2023 1208   LDLCALC 64 12/09/2020 1120   LDLCALC 76 04/01/2020 1130   LDLDIRECT 91.0 01/10/2022 1557    WEIGHTS: Wt Readings from Last 3 Encounters:  04/17/24 89 lb (40.4 kg)  04/14/24 90 lb 2 oz (40.9 kg)  04/11/24 88 lb 12.8 oz (40.3 kg)    VITALS: BP (!) 162/69 (BP Location: Left Arm, Patient Position: Sitting,  Cuff Size: Small)   Pulse 72   Ht 5' (1.524 m)   Wt 89 lb (40.4 kg)   SpO2 95%   BMI 17.38 kg/m   EXAM: Deferred  EKG: Deferred  ASSESSMENT: Definite HeFH - Dutch score of 9 Multiple vessel coronary artery calcification/aortic atherosclerosis PAD with left lower extremity claudication Ongoing tobacco abuse Hypertension LP(a) negative  PLAN: 1.   Mrs. Nippert is near goal LDL less than 70 and has had further reduction in her lipids with diet and lifestyle modifications.  I would recommend continue her current medications and we will plan repeat lipids today.  She was noted also to have some hypertension initially however repeat blood pressure is improved.  We discussed her lower extremity Dopplers and carotid Dopplers as well today.  I have arranged for follow-up with Dr. Court.  She should remain on her current lipid-lowering therapies and can follow-up with me as needed.  For reauthorization of the Repatha , her primary team would only need to message the pharmacy technicians.  Finally, she continues to be an active smoker.  We discussed tobacco cessation today as well as ways to help quit and she is not yet ready to quit at this point but has been cutting back.  Vinie KYM Maxcy, MD, [facility], FNLA, FACP  [place]  [facility]  Medical Director of the Advanced Lipid Disorders &  Cardiovascular Risk Reduction Clinic Diplomate of the American Board of Clinical Lipidology Attending Cardiologist  Direct Dial: [phone]  Fax: [phone]  Website:  [email or web address] Vinie JAYSON Maxcy 04/17/2024, 9:33 AM

## 2024-04-17 NOTE — Patient Instructions (Signed)
 Medication Instructions:  No changes  *If you need a refill on your cardiac medications before your next appointment, please call your pharmacy*   Lab Work: NMR- today  If you have labs (blood work) drawn today and your tests are completely normal, you will receive your results only by: MyChart Message (if you have MyChart) OR A paper copy in the mail If you have any lab test that is abnormal or we need to change your treatment, we will call you to review the results.   Testing/Procedures: Not needed   Follow-Up: At [facility], you and your health needs are our priority.  As part of our continuing mission to provide you with exceptional heart care, we have created designated Provider Care Teams.  These Care Teams include your primary Cardiologist (physician) and Advanced Practice Providers (APPs -  Physician Assistants and Nurse Practitioners) who all work together to provide you with the care you need, when you need it.     Your next appointment:   As needed  The format for your next appointment:   In Person  Provider:    Dr Mona     Need an appointment to see Dorn Lesches, MD. Dr Lesches is your primary cardiologist

## 2024-04-18 LAB — NMR, LIPOPROFILE
Cholesterol, Total: 159 mg/dL (ref 100–199)
HDL Particle Number: 37.1 umol/L (ref >=30.5)
HDL-C: 60 mg/dL (ref >39)
LDL Particle Number: 937 nmol/L (ref <1000)
LDL Size: 21 nm (ref >20.5)
LDL-C (NIH Calc): 80 mg/dL (ref 0–99)
LP-IR Score: <25 (ref <=45)
Small LDL Particle Number: 395 nmol/L (ref <=527)
Triglycerides: 103 mg/dL (ref 0–149)

## 2024-04-21 ENCOUNTER — Ambulatory Visit: Payer: Self-pay | Admitting: Internal Medicine

## 2024-04-21 ENCOUNTER — Other Ambulatory Visit: Payer: Self-pay | Admitting: Cardiovascular Disease

## 2024-04-21 DIAGNOSIS — E785 Hyperlipidemia, unspecified: Secondary | ICD-10-CM

## 2024-04-23 ENCOUNTER — Encounter: Payer: Self-pay | Admitting: Cardiovascular Disease

## 2024-04-23 ENCOUNTER — Ambulatory Visit: Attending: Cardiology | Admitting: Cardiovascular Disease

## 2024-04-23 VITALS — BP 152/74 | HR 66 | Ht 60.0 in | Wt 88.0 lb

## 2024-04-23 DIAGNOSIS — E7849 Other hyperlipidemia: Secondary | ICD-10-CM

## 2024-04-23 DIAGNOSIS — I739 Peripheral vascular disease, unspecified: Secondary | ICD-10-CM | POA: Diagnosis not present

## 2024-04-23 DIAGNOSIS — E785 Hyperlipidemia, unspecified: Secondary | ICD-10-CM | POA: Diagnosis not present

## 2024-04-23 DIAGNOSIS — I10 Essential (primary) hypertension: Secondary | ICD-10-CM | POA: Diagnosis not present

## 2024-04-23 NOTE — Progress Notes (Signed)
 04/23/2024 Leah [name]   August 06, 1945  [id number]  Primary Physician Antonio Cyndee [name], DO Primary Cardiologist: Dorn JINNY Lesches MD [place] [place], [place]  HPI:  Leah [name] is a 78 y.o.  thin appearing married Caucasian female, mother 3, grandmother of 5 grandchildren referred by Dr. Mona for peripheral vascular valuation because of left lower extremity claudication.    I last saw her in the office 10/29/2020.  Her risk factors include 50 pack years of tobacco abuse smoking 1 pack/day, treated hypertension and severe hyperlipidemia.  She does have a strong family history for heart disease with a father who had CABG at age 13 and brother who is had stents.  She is never had a heart attack or stroke.  She denies chest pain or shortness of breath.  She does have a daughter age 31 who has had what sounds like an aortobifemoral bypass graft.  She is complained of left hip claudication for several years and has been seen by an orthopedic surgeon.  She is been getting hip injections for this pain and was told that she had bursitis.   I performed angiography on her 10/14/2018 revealing an occluded left common iliac artery.  She underwent PTA and covered stenting using overlapping VBX stents (7 x 59, 7 x 29) is resulted in resolution of her claudication symptoms and normalization of her Dopplers.  Her most recent Doppler study performed 03/04/2019 revealed a widely patent stent.    Since I saw her in the office 3-1/2 years ago she continues to do well.  She denies claudication.  She does continue to smoke however.  Her lipid profile is under better control manage by Dr. Mona.  Her most recent Doppler studies performed 02/2024 revealed a widely patent left common artery stent.  She denies chest pain or shortness of breath.   Current Meds  Medication Sig   albuterol  (VENTOLIN  HFA) 108 (90 Base) MCG/ACT inhaler TAKE 2 PUFFS BY MOUTH EVERY 6 HOURS AS NEEDED   aspirin  EC 81 MG EC tablet  Take 1 tablet (81 mg total) by mouth daily.   atorvastatin  (LIPITOR ) 40 MG tablet Take 1 tablet (40 mg total) by mouth daily.   clopidogrel  (PLAVIX ) 75 MG tablet TAKE 1 TABLET EVERY DAY   dicyclomine  (BENTYL ) 10 MG capsule TAKE 1 CAPSULE BY MOUTH AS NEEDED FOR UP TO 1 DOSE FOR SPASMS.   Digestive Enzymes (DIGESTIVE ENZYME PO) Take by mouth daily. Patient taking after each meal   doxycycline  (VIBRA -TABS) 100 MG tablet Take 1 tablet (100 mg total) by mouth 2 (two) times daily.   estradiol  (CLIMARA  - DOSED IN MG/24 HR) 0.1 mg/24hr patch Place 0.1 mg onto the skin every Monday.   Evolocumab  (REPATHA  SURECLICK) 140 MG/ML SOAJ INJECT 1 DOSE INTO THE SKIN EVERY 14 (FOURTEEN) DAYS.   ezetimibe  (ZETIA ) 10 MG tablet Take 1 tablet (10 mg total) by mouth daily.   famotidine  (PEPCID ) 20 MG tablet Take 1 tablet (20 mg total) by mouth 2 (two) times daily.   fluticasone  (FLONASE ) 50 MCG/ACT nasal spray Place 2 sprays into both nostrils daily.   fluticasone  (FLONASE ) 50 MCG/ACT nasal spray Place 2 sprays into both nostrils daily.   linaclotide  (LINZESS ) 145 MCG CAPS capsule Take 1 capsule (145 mcg total) by mouth daily before breakfast. Lot: [id number], exp: 09-2024   linaclotide  (LINZESS ) 72 MCG capsule Take 1 capsule (72 mcg total) by mouth daily before breakfast. Lot: [id number], exp: 07-2026   lisinopril  (ZESTRIL )  20 MG tablet TAKE 1 TABLET EVERY DAY   loratadine  (CLARITIN ) 10 MG tablet Take 1 tablet (10 mg total) by mouth daily.   LORazepam  (ATIVAN ) 0.5 MG tablet Take 0.5 mg by mouth at bedtime.    ondansetron  (ZOFRAN -ODT) 4 MG disintegrating tablet Take 1 tablet (4 mg total) by mouth every 6 (six) hours as needed for nausea or vomiting.   pantoprazole  (PROTONIX ) 40 MG tablet Take 1 tablet (40 mg total) by mouth daily.   promethazine -dextromethorphan (PROMETHAZINE -DM) 6.25-15 MG/5ML syrup Take 5 mLs by mouth 4 (four) times daily as needed.   sertraline  (ZOLOFT ) 50 MG tablet Take 25 mg by mouth at bedtime.     sucralfate  (CARAFATE ) 1 g tablet Take 1 tablet by mouth 3 (three) times daily.   Vitamin D , Ergocalciferol , (DRISDOL ) 1.25 MG (50000 UNIT) CAPS capsule TAKE 1 CAPSULE EVERY WEEK   Current Facility-Administered Medications for the 04/23/24 encounter (Office Visit) with Court Dorn PARAS, MD  Medication   0.9 %  sodium chloride  infusion     Allergies  Allergen Reactions   Amoxicillin-Pot Clavulanate Shortness Of Breath, Itching and Swelling   Avelox [Moxifloxacin Hcl In Nacl] Swelling    Swelling, itching and shortness of breath   Ciprofloxacin Shortness Of Breath, Itching and Swelling   Keflex  [Cephalexin ] Shortness Of Breath and Swelling   Penicillins Shortness Of Breath, Itching and Swelling    Did it involve swelling of the face/tongue/throat, SOB, or low BP? Yes Did it involve sudden or severe rash/hives, skin peeling, or any reaction on the inside of your mouth or nose? No Did you need to seek medical attention at a hospital or doctor's office? Yes When did it last happen?      Several years If all above answers are "NO", may proceed with cephalosporin use.    Sulfa Antibiotics Shortness Of Breath, Itching and Swelling   Clindamycin /Lincomycin Itching and Swelling   Levaquin  [Levofloxacin ] Hives   Metronidazole  Other (See Comments)    Pt unsure of reaction   Prednisone  Other (See Comments)    GI irritation; is able to tolerate injections   Tizanidine  Other (See Comments)    Pt unsure of reaction   Tramadol  Itching   Acyclovir And Related Other (See Comments)    Pt does not recall this reaction   Hydrocodone  Rash   Zyvox  [Linezolid ] Diarrhea    Social History   Socioeconomic History   Marital status: Married    Spouse name: Not on file   Number of children: 3   Years of education: Not on file   Highest education level: Not on file  Occupational History   Not on file  Tobacco Use   Smoking status: Every Day    Current packs/day: 0.50    Average packs/day: 0.7  packs/day for 48.7 years (36.1 ttl pk-yrs)    Types: Cigarettes    Start date: 2024   Smokeless tobacco: Never   Tobacco comments:    Smokes a half a pack per day. 02/27/2024  Vaping Use   Vaping status: Never Used  Substance and Sexual Activity   Alcohol use: No    Alcohol/week: 0.0 standard drinks of alcohol   Drug use: No   Sexual activity: Not Currently    Birth control/protection: Post-menopausal  Other Topics Concern   Not on file  Social History Narrative   Not on file   Social Drivers of Health   Financial Resource Strain: Not on file  Food Insecurity: Not on file  Transportation  Needs: Not on file  Physical Activity: Not on file  Stress: Not on file  Social Connections: Not on file  Intimate Partner Violence: Not on file     Review of Systems: General: negative for chills, fever, night sweats or weight changes.  Cardiovascular: negative for chest pain, dyspnea on exertion, edema, orthopnea, palpitations, paroxysmal nocturnal dyspnea or shortness of breath Dermatological: negative for rash Respiratory: negative for cough or wheezing Urologic: negative for hematuria Abdominal: negative for nausea, vomiting, diarrhea, bright red blood per rectum, melena, or hematemesis Neurologic: negative for visual changes, syncope, or dizziness All other systems reviewed and are otherwise negative except as noted above.    Blood pressure (!) 152/74, pulse 66, height 5' (1.524 m), weight 88 lb (39.9 kg), SpO2 98%.  General appearance: alert and no distress Neck: no adenopathy, no carotid bruit, no JVD, supple, symmetrical, trachea midline, and thyroid  not enlarged, symmetric, no tenderness/mass/nodules Lungs: clear to auscultation bilaterally Heart: regular rate and rhythm, S1, S2 normal, no murmur, click, rub or gallop Extremities: extremities normal, atraumatic, no cyanosis or edema Pulses: 2+ and symmetric Skin: Skin color, texture, turgor normal. No rashes or  lesions Neurologic: Grossly normal  EKG EKG Interpretation Date/Time:  Wednesday April 23 2024 10:49:35 EDT Ventricular Rate:  66 PR Interval:  140 QRS Duration:  64 QT Interval:  396 QTC Calculation: 415 R Axis:   80  Text Interpretation: Normal sinus rhythm Nonspecific T wave abnormality When compared with ECG of 24-Sep-2023 15:17, Nonspecific T wave abnormality now evident in Lateral leads QT has shortened Confirmed by Court Carrier [phone]) on 04/23/2024 11:06:18 AM    ASSESSMENT AND PLAN:   Claudication in peripheral vascular disease History of PAD status post left common iliac artery PTA and stenting by myself with 2 overlapping VBX covered stents (7 x 59, 7 x 29) with an excellent result normalization of her Dopplers.  She denies claudication.  Most recent lower extremity arterial Doppler studies performed 03/28/2024 revealed the stent to be widely patent.  Will continue to follow this on an annual basis.     Carrier DOROTHA Court MD FACP,FACC,FAHA, [facility] 04/23/2024 11:11 AM

## 2024-04-23 NOTE — Assessment & Plan Note (Signed)
 History of PAD status post left common iliac artery PTA and stenting by myself with 2 overlapping VBX covered stents (7 x 59, 7 x 29) with an excellent result normalization of her Dopplers.  She denies claudication.  Most recent lower extremity arterial Doppler studies performed 03/28/2024 revealed the stent to be widely patent.  Will continue to follow this on an annual basis.

## 2024-04-23 NOTE — Patient Instructions (Signed)
 Medication Instructions:  Your physician recommends that you continue on your current medications as directed. Please refer to the Current Medication list given to you today.  *If you need a refill on your cardiac medications before your next appointment, please call your pharmacy*  Testing/Procedures: Your physician has requested that you have an Aorta/Iliac Duplex. This will take place at [address] [facility], 4th floor  No food after 11PM the night before.  Water  is OK. (Don't drink liquids if you have been instructed not to for ANOTHER test) Avoid foods that produce bowel gas, for 24 hours prior to exam (see below). No breakfast, no chewing gum, no smoking or carbonated beverages. Patient may take morning medications with water . Come in for test at least 15 minutes early to register.  Please note: We ask at that you not bring children with you during ultrasound (echo/ vascular) testing. Due to room size and safety concerns, children are not allowed in the ultrasound rooms during exams. Our front office staff cannot provide observation of children in our lobby area while testing is being conducted. An adult accompanying a patient to their appointment will only be allowed in the ultrasound room at the discretion of the ultrasound technician under special circumstances. We apologize for any inconvenience.   Your physician has requested that you have an ankle brachial index (ABI). During this test an ultrasound and blood pressure cuff are used to evaluate the arteries that supply the arms and legs with blood. Allow thirty minutes for this exam. There are no restrictions or special instructions. This will take place at [address] 4th floor   Please note: We ask at that you not bring children with you during ultrasound (echo/ vascular) testing. Due to room size and safety concerns, children are not allowed in the ultrasound rooms during exams. Our front office staff cannot provide observation of  children in our lobby area while testing is being conducted. An adult accompanying a patient to their appointment will only be allowed in the ultrasound room at the discretion of the ultrasound technician under special circumstances. We apologize for any inconvenience.   Follow-Up: At [facility], you and your health needs are our priority.  As part of our continuing mission to provide you with exceptional heart care, our providers are all part of one team.  This team includes your primary Cardiologist (physician) and Advanced Practice Providers or APPs (Physician Assistants and Nurse Practitioners) who all work together to provide you with the care you need, when you need it.  Your next appointment:   We will see you on an as as needed basis   Provider:   Dorn Lesches, MD

## 2024-05-12 ENCOUNTER — Telehealth: Payer: Self-pay | Admitting: Gastroenterology

## 2024-05-12 MED ORDER — LINACLOTIDE 72 MCG PO CAPS: 72.0000 ug | ORAL_CAPSULE | Freq: Every day | ORAL | 1 refills | Status: AC

## 2024-05-12 NOTE — Telephone Encounter (Signed)
 Patient requesting f/u call in regards to linzess  samples and symptoms. Please advise.

## 2024-05-12 NOTE — Telephone Encounter (Signed)
 Returned patient call. Patient on day 8 of Linzess  72 mcg.  Patient having mild diarrhea along with formed stool. Advised this is common side effect in the first 2 weeks of treatment.  Patient running out of samples.  Sent in Rx for 30 with 1 rf.  Advised patient if the medications continues to work for her to call us  back so we can send in rx to mail order. Recall in for f/u OV  09/2024.

## 2024-05-13 ENCOUNTER — Encounter: Payer: Self-pay | Admitting: Acute Care

## 2024-05-19 ENCOUNTER — Other Ambulatory Visit: Payer: Self-pay | Admitting: Family Medicine

## 2024-05-19 DIAGNOSIS — I10 Essential (primary) hypertension: Secondary | ICD-10-CM

## 2024-05-22 DIAGNOSIS — K581 Irritable bowel syndrome with constipation: Secondary | ICD-10-CM | POA: Diagnosis not present

## 2024-05-22 DIAGNOSIS — E785 Hyperlipidemia, unspecified: Secondary | ICD-10-CM | POA: Diagnosis not present

## 2024-05-22 DIAGNOSIS — Z79899 Other long term (current) drug therapy: Secondary | ICD-10-CM | POA: Diagnosis not present

## 2024-05-22 DIAGNOSIS — Z888 Allergy status to other drugs, medicaments and biological substances status: Secondary | ICD-10-CM | POA: Diagnosis not present

## 2024-05-22 DIAGNOSIS — N3010 Interstitial cystitis (chronic) without hematuria: Secondary | ICD-10-CM | POA: Diagnosis not present

## 2024-05-22 DIAGNOSIS — I10 Essential (primary) hypertension: Secondary | ICD-10-CM | POA: Diagnosis not present

## 2024-05-22 DIAGNOSIS — Z7902 Long term (current) use of antithrombotics/antiplatelets: Secondary | ICD-10-CM | POA: Diagnosis not present

## 2024-05-22 DIAGNOSIS — Z885 Allergy status to narcotic agent status: Secondary | ICD-10-CM | POA: Diagnosis not present

## 2024-05-22 DIAGNOSIS — Z881 Allergy status to other antibiotic agents status: Secondary | ICD-10-CM | POA: Diagnosis not present

## 2024-05-22 DIAGNOSIS — M810 Age-related osteoporosis without current pathological fracture: Secondary | ICD-10-CM | POA: Diagnosis not present

## 2024-05-22 DIAGNOSIS — I739 Peripheral vascular disease, unspecified: Secondary | ICD-10-CM | POA: Diagnosis not present

## 2024-05-22 DIAGNOSIS — F17210 Nicotine dependence, cigarettes, uncomplicated: Secondary | ICD-10-CM | POA: Diagnosis not present

## 2024-05-22 DIAGNOSIS — J301 Allergic rhinitis due to pollen: Secondary | ICD-10-CM | POA: Diagnosis not present

## 2024-05-22 DIAGNOSIS — Z8249 Family history of ischemic heart disease and other diseases of the circulatory system: Secondary | ICD-10-CM | POA: Diagnosis not present

## 2024-05-22 DIAGNOSIS — R636 Underweight: Secondary | ICD-10-CM | POA: Diagnosis not present

## 2024-05-22 DIAGNOSIS — R32 Unspecified urinary incontinence: Secondary | ICD-10-CM | POA: Diagnosis not present

## 2024-05-22 DIAGNOSIS — J40 Bronchitis, not specified as acute or chronic: Secondary | ICD-10-CM | POA: Diagnosis not present

## 2024-05-22 DIAGNOSIS — K219 Gastro-esophageal reflux disease without esophagitis: Secondary | ICD-10-CM | POA: Diagnosis not present

## 2024-05-22 DIAGNOSIS — N959 Unspecified menopausal and perimenopausal disorder: Secondary | ICD-10-CM | POA: Diagnosis not present

## 2024-05-22 DIAGNOSIS — F419 Anxiety disorder, unspecified: Secondary | ICD-10-CM | POA: Diagnosis not present

## 2024-05-22 DIAGNOSIS — F325 Major depressive disorder, single episode, in full remission: Secondary | ICD-10-CM | POA: Diagnosis not present

## 2024-06-05 DIAGNOSIS — M654 Radial styloid tenosynovitis [de Quervain]: Secondary | ICD-10-CM | POA: Diagnosis not present

## 2024-06-11 ENCOUNTER — Telehealth: Payer: Self-pay | Admitting: Gastroenterology

## 2024-06-11 NOTE — Telephone Encounter (Signed)
 Pt reports she has not had a bm since Sunday. Reports she is taking her linzess  but has not had bm since. She took a dose of miralax  today and still has not gone. Instructed pt to do miralax  purge 7 doses miralax  and 32oz gatorade drink 1 cup every 15 min until gone. She verbalized understanding.

## 2024-06-11 NOTE — Telephone Encounter (Signed)
 Inbound call from patient requesting a call back due to her not having a bowel movement since Sunday. Patient has stated that she has been taking her Linzess  and miralax  and has not had any bowel movements. Patient stated that she would like for someone to give her a call back today if all possible. Please advise.

## 2024-06-12 NOTE — Telephone Encounter (Signed)
 Pt reports she is passing some gas and her stomach is starting to make gurgling noises. She is not having pain. She is aware of recommendations per [facility]. She will try taking another linzess  and will call us  back if no results by morning.

## 2024-06-12 NOTE — Telephone Encounter (Signed)
 Patient states it has been about 2 hours since she finished solutions. States she has not had a bowel movement yet. Wishing to speak with nurse. Please advise, thank you

## 2024-06-12 NOTE — Telephone Encounter (Signed)
 Pt reports she has been taking her linzess  72mcg daily and has not had a bm since Sunday. She took a dose of miralax  yesterday after the linzess  and has still not gone. Discussed with pt doing a miralax  purge. Pt called back today and states she had to wait and get more miralax  to do the purge today. States she completed the purge and it has been over 2 hours and she still has not hae a BM. Pt wants to know what Dr. Leigh recommends. Please advise.

## 2024-06-16 ENCOUNTER — Ambulatory Visit (HOSPITAL_BASED_OUTPATIENT_CLINIC_OR_DEPARTMENT_OTHER)
Admission: RE | Admit: 2024-06-16 | Discharge: 2024-06-16 | Disposition: A | Discharge status: Home / Self Care | Source: Ambulatory Visit | Attending: Acute Care | Admitting: Acute Care

## 2024-06-16 DIAGNOSIS — R911 Solitary pulmonary nodule: Secondary | ICD-10-CM | POA: Diagnosis not present

## 2024-06-16 DIAGNOSIS — I700 Atherosclerosis of aorta: Secondary | ICD-10-CM | POA: Insufficient documentation

## 2024-06-16 DIAGNOSIS — J439 Emphysema, unspecified: Secondary | ICD-10-CM | POA: Diagnosis not present

## 2024-06-16 DIAGNOSIS — Z87891 Personal history of nicotine dependence: Secondary | ICD-10-CM | POA: Insufficient documentation

## 2024-06-16 DIAGNOSIS — E041 Nontoxic single thyroid nodule: Secondary | ICD-10-CM | POA: Diagnosis not present

## 2024-06-16 DIAGNOSIS — J432 Centrilobular emphysema: Secondary | ICD-10-CM | POA: Diagnosis not present

## 2024-06-16 DIAGNOSIS — I2510 Atherosclerotic heart disease of native coronary artery without angina pectoris: Secondary | ICD-10-CM | POA: Diagnosis not present

## 2024-06-20 ENCOUNTER — Telehealth: Payer: Self-pay | Admitting: Family Medicine

## 2024-06-20 NOTE — Telephone Encounter (Signed)
 Copied from CRM [id number]. Topic: Medicare AWV >> Jun 20, 2024 10:12 AM Nathanel DEL wrote: Called LVM 06/20/2024 to sched AWV. Please schedule in office or virtual visit.   Nathanel Paschal; Care Guide Ambulatory Clinical Support [facility] Direct Dial: [phone]

## 2024-06-23 ENCOUNTER — Other Ambulatory Visit: Payer: Self-pay | Admitting: Internal Medicine

## 2024-06-24 ENCOUNTER — Other Ambulatory Visit: Payer: Self-pay | Admitting: Acute Care

## 2024-06-24 DIAGNOSIS — Z122 Encounter for screening for malignant neoplasm of respiratory organs: Secondary | ICD-10-CM

## 2024-06-24 DIAGNOSIS — F17210 Nicotine dependence, cigarettes, uncomplicated: Secondary | ICD-10-CM

## 2024-06-24 DIAGNOSIS — Z87891 Personal history of nicotine dependence: Secondary | ICD-10-CM

## 2024-06-27 ENCOUNTER — Other Ambulatory Visit: Payer: Self-pay | Admitting: Family Medicine

## 2024-06-27 DIAGNOSIS — R1013 Epigastric pain: Secondary | ICD-10-CM

## 2024-06-27 DIAGNOSIS — E785 Hyperlipidemia, unspecified: Secondary | ICD-10-CM

## 2024-07-01 ENCOUNTER — Encounter: Payer: Self-pay | Admitting: Acute Care

## 2024-07-01 ENCOUNTER — Ambulatory Visit: Admitting: Acute Care

## 2024-07-01 VITALS — BP 136/66 | HR 84 | Temp 98.4°F | Ht 60.0 in | Wt 91.0 lb

## 2024-07-01 DIAGNOSIS — R911 Solitary pulmonary nodule: Secondary | ICD-10-CM

## 2024-07-01 DIAGNOSIS — J449 Chronic obstructive pulmonary disease, unspecified: Secondary | ICD-10-CM | POA: Diagnosis not present

## 2024-07-01 DIAGNOSIS — F17210 Nicotine dependence, cigarettes, uncomplicated: Secondary | ICD-10-CM | POA: Diagnosis not present

## 2024-07-01 DIAGNOSIS — R9389 Abnormal findings on diagnostic imaging of other specified body structures: Secondary | ICD-10-CM | POA: Diagnosis not present

## 2024-07-01 DIAGNOSIS — F17200 Nicotine dependence, unspecified, uncomplicated: Secondary | ICD-10-CM

## 2024-07-01 NOTE — Patient Instructions (Signed)
 It is good to see you today. We have reviewed your CT Chest.  The 7 mm nodule of concern is no longer there.  This is great news. You will be returned to the Lung Cancer Screening Program with next scan due 05/2025.  You will get a call closer to the time to get this scheduled.  We will call you with results unless you have an abnormal scan, in which case we will have you come into the office to be seen. Call for any unexplained weight loss or blood in your sputum.  Note your daily symptoms > remember red flags for COPD:  Increase in cough, increase in sputum production, increase in shortness of breath or activity intolerance. If you notice these symptoms, please call to be seen.    Please work on quitting smoking. You can receive free nicotine  replacement therapy (patches, gum, or mints) by calling 1-800-QUIT NOW. Please call so we can get you on the path to becoming a non-smoker. I know it is hard, but you can do this!  Hypnosis for smoking cessation  [employer] [phone]  Acupuncture for smoking cessation  [employer] [phone]

## 2024-07-01 NOTE — Progress Notes (Signed)
 History of Present Illness Leah [name] is a 78 y.o. female current every day smoker folowed through the lung cancer screening program.Referred 01/2024 by Dr. Antonio Meth for LR 3 scan.    07/01/2024 Discussed the use of AI scribe software for clinical note transcription with the patient, who gave verbal consent to proceed.  Synopsis [name] has had sporadic lung cancer screening.  Previous screenings  were  read as lung RADS 2 which required a 41-month annual follow-up.  Most recent low-dose CT done 12/2023 was read as a lung RADS 3.  There is a new part solid 0.7 cm posterior right lower lobe nodule with tiny 0.2 cm solid component.  With patient's smoking history this warrants close surveillance.  Per the lung cancer screening guidelines recommendation is for a 64-month follow-up CT to reevaluate for stability versus growth.  This will be due in November 2025.  Pt. Is here today to go over the results of her 05/2024 follow up CT Chest.  History of Present Illness Pt. Presents for follow up to review her 05/2024 CT Chest.The right lower lobe 7 mm mixed attenuation nodule is no longer  identifies, so most likely infectious or inflammatory in origin. The follow up scan has been scored as a LR 2,stable scan, 12 month follow up due 05/2025.  Pt. Does have aortic atherosclerosis, Emphysema and CAD. She is followed by cardiology.   She is still smoking a half pack per day. We spent 3 minutes on smoking cessation counseling today. She has been provided with smoking cessation resources in her AVS as well as a handout today. She is not ready to set a quit date.      Test Results: CT Chest 06/16/2024 The right lower lobe 7 mm mixed attenuation nodule is no longer identified.  Right greater than left apical pleuroparenchymal scarring again identified. A right middle lobe subpleural pulmonary nodule is unchanged at 2.1 mm. Upper Abdomen: Right larger than left central hepatic cysts of up  to 1.4 cm. Cholecystectomy. Anterior splenic 1.8 cm probable cyst is present on the prior. Normal imaged portions of the stomach, pancreas, adrenal glands, kidneys. Abdominal aortic atherosclerosis. Musculoskeletal: Accentuation of expected thoracic kyphosis. Cervical spine fixation.   IMPRESSION: 1. Lung-RADS 2, benign appearance or behavior. Continue annual screening with low-dose chest CT without contrast in 12 months. 2. Aortic Atherosclerosis (ICD10-I70.0) and Emphysema (ICD10-J43.9). Coronary artery atherosclerosis.    Latest Ref Rng & Units 02/21/2024    9:33 AM 11/01/2023   11:07 AM 09/03/2023   12:08 PM  CBC  WBC 4.0 - 10.5 K/uL 4.8  6.4  5.3   Hemoglobin 12.0 - 15.0 g/dL 85.9  86.0  86.0   Hematocrit 36.0 - 46.0 % 41.8  41.4  42.7   Platelets 150.0 - 400.0 K/uL 180.0  213.0  200.0        Latest Ref Rng & Units 02/21/2024    9:33 AM 11/01/2023   11:07 AM 09/03/2023   12:08 PM  BMP  Glucose 70 - 99 mg/dL 890  893  897   BUN 6 - 23 mg/dL 14  11  14    Creatinine 0.40 - 1.20 mg/dL 9.21  9.15  9.24   Sodium 135 - 145 mEq/L 140  140  139   Potassium 3.5 - 5.1 mEq/L 4.2  4.4  4.5   Chloride 96 - 112 mEq/L 102  102  101   CO2 19 - 32 mEq/L 33  31  31  Calcium  8.4 - 10.5 mg/dL 9.2  9.3  9.0     BNP No results found for: BNP  ProBNP No results found for: PROBNP  PFT No results found for: FEV1PRE, FEV1POST, FVCPRE, FVCPOST, TLC, DLCOUNC, PREFEV1FVCRT, PSTFEV1FVCRT  CT CHEST LCS NODULE F/U LOW DOSE WO CONTRAST Result Date: 06/24/2024 CLINICAL DATA:  Short-term follow-up of lung cancer screening exam. EXAM: CT CHEST WITHOUT CONTRAST FOR LUNG CANCER SCREENING NODULE FOLLOW-UP TECHNIQUE: Multidetector CT imaging of the chest was performed following the standard protocol without IV contrast. RADIATION DOSE REDUCTION: This exam was performed according to the departmental dose-optimization program which includes automated exposure control, adjustment of the mA  and/or kV according to patient size and/or use of iterative reconstruction technique. COMPARISON:  12/11/2023 FINDINGS: Cardiovascular: Advanced aortic and branch vessel atherosclerosis. Normal heart size, without pericardial effusion. Three vessel coronary artery calcification. Mediastinum/Nodes: 8 mm left thyroid  nodule. Not clinically significant; no follow-up imaging recommended (ref: J Am Coll Radiol. 2015 Feb;12(2): 143-50) No mediastinal or hilar adenopathy, given limitations of unenhanced CT. Lungs/Pleura: No pleural fluid. Advanced centrilobular emphysema. Mucoid impaction is most significant in the right lower lobe. The right lower lobe 7 mm mixed attenuation nodule is no longer identified. Right greater than left apical pleuroparenchymal scarring again identified. A right middle lobe subpleural pulmonary nodule is unchanged at 2.1 mm. Upper Abdomen: Right larger than left central hepatic cysts of up to 1.4 cm. Cholecystectomy. Anterior splenic 1.8 cm probable cyst is present on the prior. Normal imaged portions of the stomach, pancreas, adrenal glands, kidneys. Abdominal aortic atherosclerosis. Musculoskeletal: Accentuation of expected thoracic kyphosis. Cervical spine fixation. IMPRESSION: 1. Lung-RADS 2, benign appearance or behavior. Continue annual screening with low-dose chest CT without contrast in 12 months. 2. Aortic Atherosclerosis (ICD10-I70.0) and Emphysema (ICD10-J43.9). Coronary artery atherosclerosis. Electronically Signed   By: Rockey Kilts M.D.   On: 06/24/2024 13:09     Past medical hx Past Medical History:  Diagnosis Date   Allergy    ANEMIA, IRON DEFICIENCY 03/19/2009   Qualifier: Diagnosis of   By: Antonio ROSALEA Rockers         Anxiety    Barrett esophagus    Bladder pain    Cataract    Chronic bronchitis (HCC)    per pt last episode july 2016   Complication of anesthesia    slow to wake   COPD (chronic obstructive pulmonary disease) (HCC)    Depression    Diverticulosis  of colon (without mention of hemorrhage) 03/14/2007   Centricity Description: DIVERTICULOSIS, COLON  Qualifier: Diagnosis of   By: Lewellyn CMA (AAMA), Amanda      Centricity Description: DIVERTICULOSIS-COLON  Qualifier: Diagnosis of   By: Ever Riggers, Amy S     Replacing diagnoses that were inactivated after the 10/30/22 regulatory import     Exocrine pancreatic insufficiency    Frequency of urination    GERD (gastroesophageal reflux disease)    Hiatal hernia    History of adenomatous polyp of colon    tubular adenoma   History of chronic gastritis    History of diverticulitis 03/02/2014   History of diverticulitis of colon    History of esophageal dilatation    for stricture   History of esophageal spasm    Hyperlipidemia    Hypertension    IBS 11/13/2007   Qualifier: Diagnosis of   By: Lewellyn CMA (AAMA), Amanda         IBS (irritable bowel syndrome)    Interstitial cystitis  PAD (peripheral artery disease)    Partial small bowel obstruction (HCC) 09/04/2019   Productive cough    intermittant   SBO (small bowel obstruction) (HCC) 09/19/2019   Smokers' cough (HCC)    Urgency of urination      Social History   Tobacco Use   Smoking status: Every Day    Current packs/day: 0.50    Average packs/day: 0.7 packs/day for 48.9 years (36.2 ttl pk-yrs)    Types: Cigarettes    Start date: 2024   Smokeless tobacco: Never   Tobacco comments:    Smokes a half a pack per day 07/01/2024 KRD  Vaping Use   Vaping status: Never Used  Substance Use Topics   Alcohol use: No    Alcohol/week: 0.0 standard drinks of alcohol   Drug use: No    Ms.Angelini reports that she has been smoking cigarettes. She started smoking about 23 months ago. She has a 36.2 pack-year smoking history. She has never used smokeless tobacco. She reports that she does not drink alcohol and does not use drugs.  Tobacco Cessation: Ready to quit: Not Answered Counseling given: Not Answered Tobacco comments:  Smokes a half a pack per day 07/01/2024 KRD Current every day smoker   Past surgical hx, Family hx, Social hx all reviewed.  Current Outpatient Medications on File Prior to Visit  Medication Sig   albuterol  (VENTOLIN  HFA) 108 (90 Base) MCG/ACT inhaler TAKE 2 PUFFS BY MOUTH EVERY 6 HOURS AS NEEDED   atorvastatin  (LIPITOR ) 40 MG tablet Take 1 tablet (40 mg total) by mouth daily.   clopidogrel  (PLAVIX ) 75 MG tablet TAKE 1 TABLET EVERY DAY   dicyclomine  (BENTYL ) 10 MG capsule TAKE 1 CAPSULE BY MOUTH AS NEEDED FOR UP TO 1 DOSE FOR SPASMS.   Digestive Enzymes (DIGESTIVE ENZYME PO) Take by mouth daily. Patient taking after each meal   estradiol  (CLIMARA  - DOSED IN MG/24 HR) 0.1 mg/24hr patch Place 0.1 mg onto the skin every Monday.   Evolocumab  (REPATHA  SURECLICK) 140 MG/ML SOAJ INJECT 1 DOSE INTO THE SKIN EVERY 14 (FOURTEEN) DAYS.   ezetimibe  (ZETIA ) 10 MG tablet TAKE 1 TABLET EVERY DAY   famotidine  (PEPCID ) 20 MG tablet TAKE 1 TABLET TWICE DAILY   fluticasone  (FLONASE ) 50 MCG/ACT nasal spray Place 2 sprays into both nostrils daily.   linaclotide  (LINZESS ) 72 MCG capsule Take 1 capsule (72 mcg total) by mouth daily before breakfast.   lisinopril  (ZESTRIL ) 20 MG tablet Take 1 tablet (20 mg total) by mouth daily.   loratadine  (CLARITIN ) 10 MG tablet Take 1 tablet (10 mg total) by mouth daily.   LORazepam  (ATIVAN ) 0.5 MG tablet Take 0.5 mg by mouth at bedtime.    ondansetron  (ZOFRAN -ODT) 4 MG disintegrating tablet Take 1 tablet (4 mg total) by mouth every 6 (six) hours as needed for nausea or vomiting.   pantoprazole  (PROTONIX ) 40 MG tablet Take 1 tablet (40 mg total) by mouth daily.   polyethylene glycol (MIRALAX ) 17 g packet Take 17 g by mouth 2 (two) times daily.   sertraline  (ZOLOFT ) 50 MG tablet Take 25 mg by mouth at bedtime.    sucralfate  (CARAFATE ) 1 g tablet Take 1 tablet by mouth 3 (three) times daily.   Vitamin D , Ergocalciferol , (DRISDOL ) 1.25 MG (50000 UNIT) CAPS capsule TAKE 1 CAPSULE  EVERY WEEK   Current Facility-Administered Medications on File Prior to Visit  Medication   0.9 %  sodium chloride  infusion     Allergies  Allergen Reactions  Amoxicillin-Pot Clavulanate Shortness Of Breath, Itching and Swelling   Avelox [Moxifloxacin Hcl In Nacl] Swelling    Swelling, itching and shortness of breath   Ciprofloxacin Shortness Of Breath, Itching and Swelling   Keflex  [Cephalexin ] Shortness Of Breath and Swelling   Penicillins Shortness Of Breath, Itching and Swelling    Did it involve swelling of the face/tongue/throat, SOB, or low BP? Yes Did it involve sudden or severe rash/hives, skin peeling, or any reaction on the inside of your mouth or nose? No Did you need to seek medical attention at a hospital or doctor's office? Yes When did it last happen?      Several years If all above answers are "NO", may proceed with cephalosporin use.    Sulfa Antibiotics Shortness Of Breath, Itching and Swelling   Clindamycin /Lincomycin Itching and Swelling   Levaquin  [Levofloxacin ] Hives   Metronidazole  Other (See Comments)    Pt unsure of reaction   Prednisone  Other (See Comments)    GI irritation; is able to tolerate injections   Tizanidine  Other (See Comments)    Pt unsure of reaction   Tramadol  Itching   Sulfur      Other Reaction(s): Unknown   Acyclovir And Related Other (See Comments)    Pt does not recall this reaction   Hydrocodone  Rash   Zyvox  [Linezolid ] Diarrhea    Review Of Systems:  Constitutional:   No  weight loss, night sweats,  Fevers, chills, fatigue, or  lassitude.  HEENT:   No headaches,  Difficulty swallowing,  Tooth/dental problems, or  Sore throat,                No sneezing, itching, ear ache, nasal congestion, post nasal drip,   CV:  No chest pain,  Orthopnea, PND, swelling in lower extremities, anasarca, dizziness, palpitations, syncope.   GI  No heartburn, indigestion, abdominal pain, nausea, vomiting, diarrhea, change in bowel habits, loss  of appetite, bloody stools.   Resp: + shortness of breath with exertion less at rest.  No excess mucus, no productive cough,  No non-productive cough,  No coughing up of blood.  No change in color of mucus.  No wheezing.  No chest wall deformity  Skin: no rash or lesions.  GU: no dysuria, change in color of urine, no urgency or frequency.  No flank pain, no hematuria   MS:  No joint pain or swelling.  No decreased range of motion.  No back pain.  Psych:  No change in mood or affect. No depression or anxiety.  No memory loss.   Vital Signs BP 136/66   Pulse 84   Temp 98.4 F (36.9 C) (Oral)   Ht 5' (1.524 m)   Wt 91 lb (41.3 kg)   SpO2 98%   BMI 17.77 kg/m    Physical Exam:  General- No distress,  A&Ox3, pleasant ENT: No sinus tenderness, TM clear, pale nasal mucosa, no oral exudate,no post nasal drip, no LAN Cardiac: S1, S2, regular rate and rhythm, no murmur Chest: No wheeze/ rales/ dullness; no accessory muscle use, no nasal flaring, no sternal retractions Abd.: Soft Non-tender, ND, BS +, Body mass index is 17.77 kg/m.  Ext: No clubbing cyanosis, edema, no obvious deformities Neuro:  normal strength, MAE x 4, A&O x 3, appropriate Skin: No rashes, warm and dry, no obvious deformities Psych: normal mood and behavior  Physical Exam   Assessment & Plan Assessment/Plan Resolved Pulmonary Nodule Suspect etiology is infectious or inflammatory Current every day smoker  Plan We have reviewed your CT Chest.  The 7 mm nodule of concern is no longer there.  This is great news. You will be returned to the Lung Cancer Screening Program with next scan due 05/2025.  You will get a call closer to the time to get this scheduled.  We will call you with results unless you have an abnormal scan, in which case we will have you come into the office to be seen. Call for any unexplained weight loss or blood in your sputum.  Note your daily symptoms > remember red flags for COPD:   Increase in cough, increase in sputum production, increase in shortness of breath or activity intolerance. If you notice these symptoms, please call to be seen.    Please work on quitting smoking. You can receive free nicotine  replacement therapy (patches, gum, or mints) by calling 1-800-QUIT NOW. Please call so we can get you on the path to becoming a non-smoker. I know it is hard, but you can do this!  Hypnosis for smoking cessation  [employer] [phone]  Acupuncture for smoking cessation  [employer] [phone]    Smoking cessation counseling today for 3 minutes   I spent 20 minutes dedicated to the care of this patient on the date of this encounter to include pre-visit review of records, face-to-face time with the patient discussing conditions above, post visit ordering of testing, clinical documentation with the electronic health record, making appropriate referrals as documented, and communicating necessary information to the patient's healthcare team.     Lauraine JULIANNA Lites, NP 07/01/2024  1:30 PM

## 2024-07-14 ENCOUNTER — Telehealth (HOSPITAL_BASED_OUTPATIENT_CLINIC_OR_DEPARTMENT_OTHER): Payer: Self-pay

## 2024-07-14 NOTE — Telephone Encounter (Signed)
° °  Name: Leah [name]  DOB: 10-11-1945  MRN: [id number]  Primary Cardiologist: Dorn Lesches, MD   Preoperative team, please contact this patient and set up a phone call appointment for further preoperative risk assessment. Please obtain consent and complete medication review. Thank you for your help.  I confirm that guidance regarding antiplatelet and oral anticoagulation therapy has been completed and, if necessary, noted below.  Per office protocol, if patient is without any new symptoms or concerns at the time of their virtual visit, she may hold Plavix  for 5 days prior to procedure. Please resume Plavix  as soon as possible postprocedure, at the discretion of the surgeon.    I also confirmed the patient resides in the state of [place] . As per [facility] Medical Board telemedicine laws, the patient must reside in the state in which the provider is licensed.   Lamarr Satterfield, NP 07/14/2024, 12:49 PM [facility]

## 2024-07-14 NOTE — Telephone Encounter (Addendum)
° °  Pre-operative Risk Assessment    Patient Name: Leah [name]  DOB: 1945/09/06 MRN: [id number]   Date of last office visit: 04/23/24 with Dr. Court  NA Date of next office visit: NA  Request for Surgical Clearance    Procedure:  C7- T1 ESI   Date of Surgery:  Clearance TBD - STAT                                 Surgeon:  NA Surgeon's Group or Practice Name:  [facility]  Phone number:  [phone] Fax number:  [phone]    Type of Clearance Requested:   - Medical  - Pharmacy:  Hold Clopidogrel  (Plavix ) for 7 days prior   Type of Anesthesia:  Not Indicated   Additional requests/questions:    Bonney Augustin JONETTA Delores   07/14/2024, 12:15 PM

## 2024-07-14 NOTE — Telephone Encounter (Signed)
 Patient is scheduled for tele visit on 07/15/24 @ 10:20. Med and consent are complete. Call patient at [phone]

## 2024-07-15 ENCOUNTER — Ambulatory Visit

## 2024-07-15 DIAGNOSIS — Z01810 Encounter for preprocedural cardiovascular examination: Secondary | ICD-10-CM

## 2024-07-15 NOTE — Progress Notes (Signed)
 Virtual Visit via Telephone Note   Because of Shandelle Borrelli Ganger co-morbid illnesses, she is at least at moderate risk for complications without adequate follow up.  This format is felt to be most appropriate for this patient at this time.  Due to technical limitations with video connection (technology), today's appointment will be conducted as an audio only telehealth visit, and Rabiah Goeser Ackerley verbally agreed to proceed in this manner.   All issues noted in this document were discussed and addressed.  No physical exam could be performed with this format.  Evaluation Performed:  Preoperative cardiovascular risk assessment _____________   Date:  07/15/2024   Patient ID:  NANDANA KROLIKOWSKI, DOB August 29, 1945, MRN [id number] Patient Location:  Home Provider location:   Office  Primary Care Provider:  Antonio Meth, Jamee SAUNDERS, DO Primary Cardiologist:  Dorn Lesches, MD  Chief Complaint / Patient Profile   78 y.o. y/o female with a h/o neck surgery several years ago who is pending C7-T1 ESI and presents today for telephonic preoperative cardiovascular risk assessment.  History of Present Illness    Leah [name] is a 78 y.o. female who presents via audio/video conferencing for a telehealth visit today.  Pt was last seen in cardiology clinic on 04/19/2024 by Dr. Lesches.  At that time CHERMAINE SCHNYDER was doing well.  The patient is now pending procedure as outlined above. Since her last visit, she has been doing well without any cardiac issues.  No chest pain or shortness of breath.  She does meet minimum METS on the DASI.  She does crossword puzzles to keep her mind sharp.  Per office protocol, if patient is without any new symptoms or concerns at the time of their virtual visit, she may hold Plavix  for 5 days prior to procedure. Please resume Plavix  as soon as possible postprocedure, at the discretion of the surgeon.    Past Medical History    Past Medical History:  Diagnosis Date    Allergy    ANEMIA, IRON DEFICIENCY 03/19/2009   Qualifier: Diagnosis of   By: Antonio ROSALEA Jamee         Anxiety    Barrett esophagus    Bladder pain    Cataract    Chronic bronchitis (HCC)    per pt last episode july 2016   Complication of anesthesia    slow to wake   COPD (chronic obstructive pulmonary disease) (HCC)    Depression    Diverticulosis of colon (without mention of hemorrhage) 03/14/2007   Centricity Description: DIVERTICULOSIS, COLON  Qualifier: Diagnosis of   By: Lewellyn CMA (AAMA), Amanda      Centricity Description: DIVERTICULOSIS-COLON  Qualifier: Diagnosis of   By: Ever Riggers, Amy S     Replacing diagnoses that were inactivated after the 10/30/22 regulatory import     Exocrine pancreatic insufficiency    Frequency of urination    GERD (gastroesophageal reflux disease)    Hiatal hernia    History of adenomatous polyp of colon    tubular adenoma   History of chronic gastritis    History of diverticulitis 03/02/2014   History of diverticulitis of colon    History of esophageal dilatation    for stricture   History of esophageal spasm    Hyperlipidemia    Hypertension    IBS 11/13/2007   Qualifier: Diagnosis of   By: Lewellyn CMA (AAMA), Amanda         IBS (irritable bowel syndrome)  Interstitial cystitis    PAD (peripheral artery disease)    Partial small bowel obstruction (HCC) 09/04/2019   Productive cough    intermittant   SBO (small bowel obstruction) (HCC) 09/19/2019   Smokers' cough (HCC)    Urgency of urination    Past Surgical History:  Procedure Laterality Date   ABDOMINAL AORTOGRAM N/A 10/14/2018   Procedure: ABDOMINAL AORTOGRAM;  Surgeon: Court Dorn PARAS, MD;  Location: [facility] INVASIVE CV LAB;  Service: Cardiovascular;  Laterality: N/A;   ABDOMINAL HYSTERECTOMY  1975   ANTERIOR CERVICAL DECOMP/DISCECTOMY FUSION N/A 09/07/2014   Procedure: Exploration of Fusion and Removal of Anterior Cervical Hematoma;  Surgeon: Reyes Budge, MD;   Location: [facility] NEURO ORS;  Service: Neurosurgery;  Laterality: N/A;   CARPAL TUNNEL RELEASE Right 1993   COLONOSCOPY  last one 04-30-2013   COLONOSCOPY WITH PROPOFOL  N/A 09/09/2019   Procedure: COLONOSCOPY WITH PROPOFOL ;  Surgeon: Legrand Victory LITTIE DOUGLAS, MD;  Location: [facility] ENDOSCOPY;  Service: Gastroenterology;  Laterality: N/A;   CYSTO WITH HYDRODISTENSION N/A 06/08/2015   Procedure: CYSTOSCOPY/HYDRODISTENSION INSTILLATION OF MARCAINE  AND PYRIDIUM ;  Surgeon: Norleen Seltzer, MD;  Location: [facility];  Service: Urology;  Laterality: N/A;   CYSTO/  HOD/  BLADDER BX  1990's   ESOPHAGOGASTRODUODENOSCOPY  last one 09-11-2012   LAPAROSCOPIC APPENDECTOMY N/A 09/21/2013   Procedure: APPENDECTOMY LAPAROSCOPIC;  Surgeon: Donnice Bury, MD;  Location: [facility] OR;  Service: General;  Laterality: N/A;   LAPAROSCOPIC CHOLECYSTECTOMY  2002   LAPAROSCOPY SIGMOID COLECTOMY  10-05-2008   diverticulitis   LOWER EXTREMITY ANGIOGRAPHY Left 10/14/2018   LOWER EXTREMITY ANGIOGRAPHY Bilateral 10/14/2018   Procedure: Lower Extremity Angiography;  Surgeon: Court Dorn PARAS, MD;  Location: [facility] INVASIVE CV LAB;  Service: Cardiovascular;  Laterality: Bilateral;  ILIACS   MICROLARYNGOSCOPY  09-27-2006   w/ true vocal cord stripping  and bilateral bx's of lesion's (benign)   PERIPHERAL VASCULAR INTERVENTION  10/14/2018   Procedure: PERIPHERAL VASCULAR INTERVENTION;  Surgeon: Court Dorn PARAS, MD;  Location: [facility] INVASIVE CV LAB;  Service: Cardiovascular;;   SINUS SURGERY WITH INSTATRAK  1991   TEMPOROMANDIBULAR JOINT SURGERY  1990   TONSILLECTOMY AND ADENOIDECTOMY  1964    Allergies  Allergies[1]  Home Medications    Prior to Admission medications  Medication Sig Start Date End Date Taking? Authorizing Provider  albuterol  (VENTOLIN  HFA) 108 (90 Base) MCG/ACT inhaler TAKE 2 PUFFS BY MOUTH EVERY 6 HOURS AS NEEDED 06/08/23 07/01/24  Saguier, [place], PA-C  atorvastatin  (LIPITOR ) 40 MG tablet Take 1 tablet (40 mg total)  by mouth daily. 04/21/24   Court Dorn PARAS, MD  clopidogrel  (PLAVIX ) 75 MG tablet TAKE 1 TABLET EVERY DAY 06/24/24   Hilty, Vinie BROCKS, MD  dicyclomine  (BENTYL ) 10 MG capsule TAKE 1 CAPSULE BY MOUTH AS NEEDED FOR UP TO 1 DOSE FOR SPASMS. 06/12/22   Legrand Victory LITTIE DOUGLAS, MD  Digestive Enzymes (DIGESTIVE ENZYME PO) Take by mouth daily. Patient taking after each meal    [provider]  estradiol  (CLIMARA  - DOSED IN MG/24 HR) 0.1 mg/24hr patch Place 0.1 mg onto the skin every Monday. 02/08/16   [provider]  Evolocumab  (REPATHA  SURECLICK) 140 MG/ML SOAJ INJECT 1 DOSE INTO THE SKIN EVERY 14 (FOURTEEN) DAYS. 12/03/23   Mona Vinie BROCKS, MD  ezetimibe  (ZETIA ) 10 MG tablet TAKE 1 TABLET EVERY DAY 06/28/24   Antonio Meth, Yvonne R, DO  famotidine  (PEPCID ) 20 MG tablet TAKE 1 TABLET TWICE DAILY 06/28/24   Antonio Meth [name], DO  fluticasone  (FLONASE ) 50 MCG/ACT nasal spray Place 2 sprays into both nostrils daily. 04/11/24   Antonio Cyndee [name], DO  linaclotide  (LINZESS ) 72 MCG capsule Take 1 capsule (72 mcg total) by mouth daily before breakfast. 05/12/24   Armbruster, Elspeth SQUIBB, MD  lisinopril  (ZESTRIL ) 20 MG tablet Take 1 tablet (20 mg total) by mouth daily. 05/19/24   Antonio Cyndee [name], DO  loratadine  (CLARITIN ) 10 MG tablet Take 1 tablet (10 mg total) by mouth daily. 11/27/23   Antonio Cyndee Jamee R, DO  LORazepam  (ATIVAN ) 0.5 MG tablet Take 0.5 mg by mouth at bedtime.  12/04/14   [provider]  ondansetron  (ZOFRAN -ODT) 4 MG disintegrating tablet Take 1 tablet (4 mg total) by mouth every 6 (six) hours as needed for nausea or vomiting. 01/24/24   Armbruster, Elspeth SQUIBB, MD  pantoprazole  (PROTONIX ) 40 MG tablet Take 1 tablet (40 mg total) by mouth daily. 03/21/24   Lowne Chase, Yvonne R, DO  polyethylene glycol (MIRALAX ) 17 g packet Take 17 g by mouth 2 (two) times daily. 09/09/19   Briana Elgin LABOR, MD  sertraline  (ZOLOFT ) 50 MG tablet Take 25 mg by mouth at bedtime.     [provider]  sucralfate  (CARAFATE ) 1 g tablet Take 1 tablet by mouth 3 (three) times daily. 12/03/23   [provider]  Vitamin D , Ergocalciferol , (DRISDOL ) 1.25 MG (50000 UNIT) CAPS capsule TAKE 1 CAPSULE EVERY WEEK 02/07/24   Antonio Cyndee [name], DO    Physical Exam    Vital Signs:  SHIRLA HODGKISS does not have vital signs available for review today.  Given telephonic nature of communication, physical exam is limited. AAOx3. NAD. Normal affect.  Speech and respirations are unlabored.  Accessory Clinical Findings    None  Assessment & Plan    1.  Preoperative Cardiovascular Risk Assessment:  Ms. Lashomb's perioperative risk of a major cardiac event is 0.4% according to the Revised Cardiac Risk Index (RCRI).  Therefore, she is at low risk for perioperative complications.   Her functional capacity is good at 5.07 METs according to the Duke Activity Status Index (DASI). Recommendations: According to ACC/AHA guidelines, no further cardiovascular testing needed.  The patient may proceed to surgery at acceptable risk.   Antiplatelet and/or Anticoagulation Recommendations: Clopidogrel  (Plavix ) can be held for 5 days prior to her surgery and resumed as soon as possible post op.  The patient was advised that if she develops new symptoms prior to surgery to contact our office to arrange for a follow-up visit, and she verbalized understanding.   A copy of this note will be routed to requesting surgeon.  Time:   Today, I have spent 6 minutes with the patient with telehealth technology discussing medical history, symptoms, and management plan.     Orren LOISE Fabry, PA-C  07/15/2024, 10:23 AM     [1]  Allergies Allergen Reactions   Amoxicillin-Pot Clavulanate Shortness Of Breath, Itching and Swelling   Avelox [Moxifloxacin Hcl In Nacl] Swelling    Swelling, itching and shortness of breath   Ciprofloxacin Shortness Of Breath, Itching and Swelling   Keflex  [Cephalexin ]  Shortness Of Breath and Swelling   Penicillins Shortness Of Breath, Itching and Swelling    Did it involve swelling of the face/tongue/throat, SOB, or low BP? Yes Did it involve sudden or severe rash/hives, skin peeling, or any reaction on the inside of your mouth or nose? No Did you need to seek medical attention at a hospital or  doctor's office? Yes When did it last happen?      Several years If all above answers are NO, may proceed with cephalosporin use.    Sulfa Antibiotics Shortness Of Breath, Itching and Swelling   Clindamycin /Lincomycin Itching and Swelling   Levaquin  [Levofloxacin ] Hives   Metronidazole  Other (See Comments)    Pt unsure of reaction   Prednisone  Other (See Comments)    GI irritation; is able to tolerate injections   Tizanidine  Other (See Comments)    Pt unsure of reaction   Tramadol  Itching   Sulfur      Other Reaction(s): Unknown   Acyclovir And Related Other (See Comments)    Pt does not recall this reaction   Hydrocodone  Rash   Zyvox  [Linezolid ] Diarrhea

## 2024-08-04 ENCOUNTER — Emergency Department (HOSPITAL_BASED_OUTPATIENT_CLINIC_OR_DEPARTMENT_OTHER)

## 2024-08-04 ENCOUNTER — Encounter (HOSPITAL_BASED_OUTPATIENT_CLINIC_OR_DEPARTMENT_OTHER): Payer: Self-pay

## 2024-08-04 ENCOUNTER — Other Ambulatory Visit: Payer: Self-pay

## 2024-08-04 ENCOUNTER — Inpatient Hospital Stay (HOSPITAL_BASED_OUTPATIENT_CLINIC_OR_DEPARTMENT_OTHER)
Admission: EM | Admit: 2024-08-04 | Discharge: 2024-08-11 | DRG: 190 | Disposition: A | Discharge status: Home Health | Attending: Internal Medicine | Admitting: Internal Medicine

## 2024-08-04 DIAGNOSIS — Z885 Allergy status to narcotic agent status: Secondary | ICD-10-CM

## 2024-08-04 DIAGNOSIS — I739 Peripheral vascular disease, unspecified: Secondary | ICD-10-CM | POA: Diagnosis present

## 2024-08-04 DIAGNOSIS — I2510 Atherosclerotic heart disease of native coronary artery without angina pectoris: Secondary | ICD-10-CM | POA: Diagnosis present

## 2024-08-04 DIAGNOSIS — U099 Post covid-19 condition, unspecified: Secondary | ICD-10-CM | POA: Diagnosis present

## 2024-08-04 DIAGNOSIS — M4722 Other spondylosis with radiculopathy, cervical region: Secondary | ICD-10-CM | POA: Diagnosis present

## 2024-08-04 DIAGNOSIS — Z833 Family history of diabetes mellitus: Secondary | ICD-10-CM

## 2024-08-04 DIAGNOSIS — Z716 Tobacco abuse counseling: Secondary | ICD-10-CM

## 2024-08-04 DIAGNOSIS — R519 Headache, unspecified: Secondary | ICD-10-CM | POA: Diagnosis present

## 2024-08-04 DIAGNOSIS — Z8249 Family history of ischemic heart disease and other diseases of the circulatory system: Secondary | ICD-10-CM

## 2024-08-04 DIAGNOSIS — N3010 Interstitial cystitis (chronic) without hematuria: Secondary | ICD-10-CM | POA: Diagnosis present

## 2024-08-04 DIAGNOSIS — E876 Hypokalemia: Secondary | ICD-10-CM | POA: Diagnosis present

## 2024-08-04 DIAGNOSIS — Z880 Allergy status to penicillin: Secondary | ICD-10-CM

## 2024-08-04 DIAGNOSIS — Z800 Family history of malignant neoplasm of digestive organs: Secondary | ICD-10-CM

## 2024-08-04 DIAGNOSIS — D696 Thrombocytopenia, unspecified: Secondary | ICD-10-CM | POA: Diagnosis present

## 2024-08-04 DIAGNOSIS — Z881 Allergy status to other antibiotic agents status: Secondary | ICD-10-CM

## 2024-08-04 DIAGNOSIS — Z806 Family history of leukemia: Secondary | ICD-10-CM

## 2024-08-04 DIAGNOSIS — J1282 Pneumonia due to coronavirus disease 2019: Secondary | ICD-10-CM

## 2024-08-04 DIAGNOSIS — Z7902 Long term (current) use of antithrombotics/antiplatelets: Secondary | ICD-10-CM

## 2024-08-04 DIAGNOSIS — F419 Anxiety disorder, unspecified: Secondary | ICD-10-CM | POA: Diagnosis present

## 2024-08-04 DIAGNOSIS — Z83719 Family history of colon polyps, unspecified: Secondary | ICD-10-CM

## 2024-08-04 DIAGNOSIS — G8929 Other chronic pain: Secondary | ICD-10-CM | POA: Diagnosis present

## 2024-08-04 DIAGNOSIS — J439 Emphysema, unspecified: Secondary | ICD-10-CM | POA: Diagnosis present

## 2024-08-04 DIAGNOSIS — U071 COVID-19: Principal | ICD-10-CM | POA: Diagnosis present

## 2024-08-04 DIAGNOSIS — Z860101 Personal history of adenomatous and serrated colon polyps: Secondary | ICD-10-CM

## 2024-08-04 DIAGNOSIS — Z888 Allergy status to other drugs, medicaments and biological substances status: Secondary | ICD-10-CM

## 2024-08-04 DIAGNOSIS — K2270 Barrett's esophagus without dysplasia: Secondary | ICD-10-CM | POA: Diagnosis present

## 2024-08-04 DIAGNOSIS — E785 Hyperlipidemia, unspecified: Secondary | ICD-10-CM | POA: Diagnosis present

## 2024-08-04 DIAGNOSIS — Z789 Other specified health status: Secondary | ICD-10-CM

## 2024-08-04 DIAGNOSIS — K219 Gastro-esophageal reflux disease without esophagitis: Secondary | ICD-10-CM | POA: Diagnosis present

## 2024-08-04 DIAGNOSIS — I10 Essential (primary) hypertension: Secondary | ICD-10-CM | POA: Diagnosis present

## 2024-08-04 DIAGNOSIS — F17210 Nicotine dependence, cigarettes, uncomplicated: Secondary | ICD-10-CM | POA: Diagnosis present

## 2024-08-04 DIAGNOSIS — Z8042 Family history of malignant neoplasm of prostate: Secondary | ICD-10-CM

## 2024-08-04 DIAGNOSIS — Z79899 Other long term (current) drug therapy: Secondary | ICD-10-CM

## 2024-08-04 DIAGNOSIS — F32A Depression, unspecified: Secondary | ICD-10-CM | POA: Diagnosis present

## 2024-08-04 DIAGNOSIS — Z90710 Acquired absence of both cervix and uterus: Secondary | ICD-10-CM

## 2024-08-04 DIAGNOSIS — D649 Anemia, unspecified: Secondary | ICD-10-CM | POA: Diagnosis present

## 2024-08-04 DIAGNOSIS — M5412 Radiculopathy, cervical region: Secondary | ICD-10-CM

## 2024-08-04 DIAGNOSIS — Z882 Allergy status to sulfonamides status: Secondary | ICD-10-CM

## 2024-08-04 DIAGNOSIS — J9601 Acute respiratory failure with hypoxia: Secondary | ICD-10-CM | POA: Diagnosis present

## 2024-08-04 DIAGNOSIS — J441 Chronic obstructive pulmonary disease with (acute) exacerbation: Principal | ICD-10-CM | POA: Diagnosis present

## 2024-08-04 DIAGNOSIS — M47812 Spondylosis without myelopathy or radiculopathy, cervical region: Secondary | ICD-10-CM

## 2024-08-04 LAB — CBC
HCT: 42.8 % (ref 36.0–46.0)
Hemoglobin: 14 g/dL (ref 12.0–15.0)
MCH: 30.1 pg (ref 26.0–34.0)
MCHC: 32.7 g/dL (ref 30.0–36.0)
MCV: 92 fL (ref 80.0–100.0)
Platelets: 130 K/uL — ABNORMAL LOW (ref 150–400)
RBC: 4.65 MIL/uL (ref 3.87–5.11)
RDW: 12.1 % (ref 11.5–15.5)
WBC: 5.9 K/uL (ref 4.0–10.5)
nRBC: 0 % (ref 0.0–0.2)

## 2024-08-04 LAB — BASIC METABOLIC PANEL WITH GFR
Anion gap: 13 (ref 5–15)
BUN: 11 mg/dL (ref 8–23)
CO2: 26 mmol/L (ref 22–32)
Calcium: 8.8 mg/dL — ABNORMAL LOW (ref 8.9–10.3)
Chloride: 100 mmol/L (ref 98–111)
Creatinine, Ser: 0.81 mg/dL (ref 0.44–1.00)
GFR, Estimated: >60 mL/min
Glucose, Bld: 169 mg/dL — ABNORMAL HIGH (ref 70–99)
Potassium: 4 mmol/L (ref 3.5–5.1)
Sodium: 139 mmol/L (ref 135–145)

## 2024-08-04 MED ORDER — METHYLPREDNISOLONE SODIUM SUCC 125 MG IJ SOLR
125.0000 mg | Freq: Every day | INTRAMUSCULAR | Status: DC
  Administered 2024-08-05: 125 mg via INTRAVENOUS
  Filled 2024-08-04: qty 2

## 2024-08-04 MED ORDER — IPRATROPIUM-ALBUTEROL 0.5-2.5 (3) MG/3ML IN SOLN
3.0000 mL | Freq: Once | RESPIRATORY_TRACT | Status: AC
  Administered 2024-08-04: 3 mL via RESPIRATORY_TRACT
  Filled 2024-08-04: qty 3

## 2024-08-04 NOTE — ED Provider Notes (Signed)
 "  Emergency Department Provider Note   I have reviewed the triage vital signs and the nursing notes.   HISTORY  Chief Complaint Shortness of Breath   HPI Leah [name] is a 79 y.o. female with past medical history of COPD presents to the emergency department with shortness of breath and burning discomfort.  She was diagnosed at an outside urgent care with COVID 5 days ago.  She was started on molnupiravir due to other drug interactions.  She states that the burning actually began 2 days prior to starting this medication but worsened while on it.  She went back to urgent care today and staff recommended that she present to the emergency department both for the burning symptoms as well as the worsening shortness of breath.  She has not developed a rash.  Burning is mainly over both shoulders, arms, face bilaterally.   Past Medical History:  Diagnosis Date   Allergy    ANEMIA, IRON DEFICIENCY 03/19/2009   Qualifier: Diagnosis of   By: Antonio ROSALEA Rockers         Anxiety    Barrett esophagus    Bladder pain    Cataract    Chronic bronchitis (HCC)    per pt last episode july 2016   Complication of anesthesia    slow to wake   COPD (chronic obstructive pulmonary disease) (HCC)    Depression    Diverticulosis of colon (without mention of hemorrhage) 03/14/2007   Centricity Description: DIVERTICULOSIS, COLON  Qualifier: Diagnosis of   By: Lewellyn CMA (AAMA), Amanda      Centricity Description: DIVERTICULOSIS-COLON  Qualifier: Diagnosis of   By: Ever Riggers, Amy S     Replacing diagnoses that were inactivated after the 10/30/22 regulatory import     Exocrine pancreatic insufficiency    Frequency of urination    GERD (gastroesophageal reflux disease)    Hiatal hernia    History of adenomatous polyp of colon    tubular adenoma   History of chronic gastritis    History of diverticulitis 03/02/2014   History of diverticulitis of colon    History of esophageal dilatation    for  stricture   History of esophageal spasm    Hyperlipidemia    Hypertension    IBS 11/13/2007   Qualifier: Diagnosis of   By: Lewellyn CMA (AAMA), Amanda         IBS (irritable bowel syndrome)    Interstitial cystitis    PAD (peripheral artery disease)    Partial small bowel obstruction (HCC) 09/04/2019   Productive cough    intermittant   SBO (small bowel obstruction) (HCC) 09/19/2019   Smokers' cough (HCC)    Urgency of urination     Review of Systems  Constitutional: No fever/chills Cardiovascular: Denies chest pain. Respiratory: Denies shortness of breath. Gastrointestinal: No abdominal pain.  No nausea, no vomiting.   Musculoskeletal: Negative for back pain. Skin: Negative for rash. Neurological: Negative for headaches, focal weakness or numbness.  ____________________________________________   PHYSICAL EXAM:  VITAL SIGNS: ED Triage Vitals  Encounter Vitals Group     BP 08/04/24 2053 (!) 167/91     Pulse Rate 08/04/24 2053 89     Resp 08/04/24 2053 17     Temp 08/04/24 2053 98.3 F (36.8 C)     Temp Source 08/04/24 2053 Oral     SpO2 08/04/24 2053 92 %     Weight 08/04/24 2055 88 lb (39.9 kg)     Height 08/04/24  2055 5' 1 (1.549 m)   Constitutional: Alert and oriented. Patient appears SOB.  Eyes: Conjunctivae are normal.  Head: Atraumatic. Nose: No congestion/rhinnorhea. Mouth/Throat: Mucous membranes are moist.  Neck: No stridor.   Cardiovascular: Normal rate, regular rhythm. Good peripheral circulation. Grossly normal heart sounds.   Respiratory: Increased respiratory effort.  No retractions. Lungs with bilateral end expiratory wheezing.  Gastrointestinal: Soft and nontender. No distention.  Musculoskeletal: No gross deformities of extremities. Neurologic:  Normal speech and language.  Skin:  Skin is warm, dry and intact. No rash noted.  ____________________________________________   LABS (all labs ordered are listed, but only abnormal results are  displayed)  Labs Reviewed  RESP PANEL BY RT-PCR (RSV, FLU A&B, COVID)  RVPGX2 - Abnormal; Notable for the following components:      Result Value   SARS Coronavirus 2 by RT PCR POSITIVE (*)    All other components within normal limits  BASIC METABOLIC PANEL WITH GFR - Abnormal; Notable for the following components:   Glucose, Bld 169 (*)    Calcium  8.8 (*)    All other components within normal limits  CBC - Abnormal; Notable for the following components:   Platelets 130 (*)    All other components within normal limits  TROPONIN T, HIGH SENSITIVITY   ____________________________________________  EKG   EKG Interpretation Date/Time:  Monday August 04 2024 21:01:13 EST Ventricular Rate:  87 PR Interval:  132 QRS Duration:  77 QT Interval:  367 QTC Calculation: 442 R Axis:   76  Text Interpretation: Sinus rhythm No significant change since last tracing Confirmed by Randol Simmonds [phone]) on 08/04/2024 9:16:26 PM        ____________________________________________  RADIOLOGY  DG Chest 2 View Result Date: 08/04/2024 EXAM: 2 VIEW(S) XRAY OF THE CHEST 08/04/2024 09:13:00 PM COMPARISON: PA and lateral chest 09/24/2023. CLINICAL HISTORY: Shortness of breath, fever, and coughing with general malaise. Positive COVID-19 diagnosis. FINDINGS: LUNGS AND PLEURA: The lungs are moderately emphysematous. No focal pulmonary opacity. No pleural effusion. No pneumothorax. HEART AND MEDIASTINUM: The cardiac size is normal. There is patchy calcification of the aorta. The mediastinum is normally outlined. BONES AND SOFT TISSUES: Osteopenia and thoracic kyphosis. 3-level lower cervical ACDF plating hardware is again seen. IMPRESSION: 1. No acute radiographic findings. 2. Moderate emphysema. Stable COPD chest . Electronically signed by: Francis Quam MD 08/04/2024 09:19 PM EST RP Workstation: [id number]    ____________________________________________   PROCEDURES  Procedure(s) performed:    Procedures  CRITICAL CARE Performed by: Fonda KANDICE Law Total critical care time: 35 minutes Critical care time was exclusive of separately billable procedures and treating other patients. Critical care was necessary to treat or prevent imminent or life-threatening deterioration. Critical care was time spent personally by me on the following activities: development of treatment plan with patient and/or surrogate as well as nursing, discussions with consultants, evaluation of patient's response to treatment, examination of patient, obtaining history from patient or surrogate, ordering and performing treatments and interventions, ordering and review of laboratory studies, ordering and review of radiographic studies, pulse oximetry and re-evaluation of patient's condition.  Fonda Law, MD Emergency Medicine  ____________________________________________   INITIAL IMPRESSION / ASSESSMENT AND PLAN / ED COURSE  Pertinent labs & imaging results that were available during my care of the patient were reviewed by me and considered in my medical decision making (see chart for details).   This patient is Presenting for Evaluation of SOB, which does require a range of treatment options, and is  a complaint that involves a high risk of morbidity and mortality.  The Differential Diagnoses include COVID, Flu, RSV, COPD exacerbation, etc.  Critical Interventions-    Medications  atorvastatin  (LIPITOR ) tablet 40 mg (has no administration in time range)  clopidogrel  (PLAVIX ) tablet 75 mg (has no administration in time range)  famotidine  (PEPCID ) tablet 20 mg (has no administration in time range)  lisinopril  (ZESTRIL ) tablet 20 mg (has no administration in time range)  LORazepam  (ATIVAN ) tablet 0.5 mg (0.5 mg Oral Given 08/05/24 0201)  sertraline  (ZOLOFT ) tablet 25 mg (25 mg Oral Given 08/05/24 0201)  ipratropium-albuterol  (DUONEB) 0.5-2.5 (3) MG/3ML nebulizer solution 3 mL (has no administration in time  range)  ipratropium-albuterol  (DUONEB) 0.5-2.5 (3) MG/3ML nebulizer solution 3 mL (has no administration in time range)  methylPREDNISolone  sodium succinate (SOLU-MEDROL ) 125 mg/2 mL injection 125 mg (has no administration in time range)  azithromycin  (ZITHROMAX ) tablet 500 mg (has no administration in time range)  ipratropium-albuterol  (DUONEB) 0.5-2.5 (3) MG/3ML nebulizer solution 3 mL (3 mLs Nebulization Given 08/04/24 2344)  gabapentin  (NEURONTIN ) capsule 200 mg (200 mg Oral Given 08/05/24 0201)    Reassessment after intervention:  symptoms improved.    I did obtain Additional Historical Information from family at bedside.   Clinical Laboratory Tests Ordered, included CBC without leukocytosis or anemia. No AKI. COVID PCR positive.   Radiologic Tests Ordered, included CXR. I independently interpreted the images and agree with radiology interpretation.   Cardiac Monitor Tracing which shows NSR.    Social Determinants of Health Risk patient is a smoker.   Consult complete with [facility], Dr. Sundil. Plan for admit.   Medical Decision Making: Summary:  Patient with SOB and burning sensation. Reported diagnosis of COVID at outside UC. No hypoxemia but increased WOB. Plan for steroids and nebs.   Reevaluation with update and discussion with patient. No hypoxemia but continued SOB and increased WOB. Well controlled with scheduled nebs at this point. Plan for admit. Burning pain continues. Will re-start home medications and start gabapentin  for now.    Patient's presentation is most consistent with acute presentation with potential threat to life or bodily function.   Disposition: admit  ____________________________________________  FINAL CLINICAL IMPRESSION(S) / ED DIAGNOSES  Final diagnoses:  COPD exacerbation (HCC)  COVID-19    Note:  This document was prepared using Dragon voice recognition software and may include unintentional dictation errors.  Fonda Law, MD, [facility] Emergency  Medicine    Orell Hurtado, Fonda MATSU, MD 08/05/24 [phone]  "

## 2024-08-04 NOTE — ED Triage Notes (Signed)
 Patient here POV from Home.  Endorses SOB, Fevers, Cough, General Malaise that prompted her to visit UC 5 days ago. Diagnosed with COVID-19 and prescribed antiviral. Had rash and skin burning as well but these symptoms worsened with medication.  NAD noted during triage. A&Ox4. GCS 15. Ambulatory.

## 2024-08-05 DIAGNOSIS — F419 Anxiety disorder, unspecified: Secondary | ICD-10-CM

## 2024-08-05 DIAGNOSIS — I10 Essential (primary) hypertension: Secondary | ICD-10-CM

## 2024-08-05 DIAGNOSIS — N3010 Interstitial cystitis (chronic) without hematuria: Secondary | ICD-10-CM | POA: Diagnosis present

## 2024-08-05 DIAGNOSIS — F17210 Nicotine dependence, cigarettes, uncomplicated: Secondary | ICD-10-CM | POA: Diagnosis present

## 2024-08-05 DIAGNOSIS — J9601 Acute respiratory failure with hypoxia: Secondary | ICD-10-CM | POA: Diagnosis present

## 2024-08-05 DIAGNOSIS — J439 Emphysema, unspecified: Secondary | ICD-10-CM | POA: Diagnosis present

## 2024-08-05 DIAGNOSIS — K2270 Barrett's esophagus without dysplasia: Secondary | ICD-10-CM | POA: Diagnosis present

## 2024-08-05 DIAGNOSIS — Z7902 Long term (current) use of antithrombotics/antiplatelets: Secondary | ICD-10-CM | POA: Diagnosis not present

## 2024-08-05 DIAGNOSIS — U071 COVID-19: Secondary | ICD-10-CM

## 2024-08-05 DIAGNOSIS — E876 Hypokalemia: Secondary | ICD-10-CM

## 2024-08-05 DIAGNOSIS — I2510 Atherosclerotic heart disease of native coronary artery without angina pectoris: Secondary | ICD-10-CM | POA: Diagnosis present

## 2024-08-05 DIAGNOSIS — M5412 Radiculopathy, cervical region: Secondary | ICD-10-CM | POA: Diagnosis not present

## 2024-08-05 DIAGNOSIS — U099 Post covid-19 condition, unspecified: Secondary | ICD-10-CM | POA: Diagnosis present

## 2024-08-05 DIAGNOSIS — F32A Depression, unspecified: Secondary | ICD-10-CM | POA: Diagnosis present

## 2024-08-05 DIAGNOSIS — Z79899 Other long term (current) drug therapy: Secondary | ICD-10-CM | POA: Diagnosis not present

## 2024-08-05 DIAGNOSIS — M47812 Spondylosis without myelopathy or radiculopathy, cervical region: Secondary | ICD-10-CM

## 2024-08-05 DIAGNOSIS — Z8249 Family history of ischemic heart disease and other diseases of the circulatory system: Secondary | ICD-10-CM | POA: Diagnosis not present

## 2024-08-05 DIAGNOSIS — D649 Anemia, unspecified: Secondary | ICD-10-CM | POA: Diagnosis present

## 2024-08-05 DIAGNOSIS — J1282 Pneumonia due to coronavirus disease 2019: Secondary | ICD-10-CM

## 2024-08-05 DIAGNOSIS — E785 Hyperlipidemia, unspecified: Secondary | ICD-10-CM | POA: Diagnosis present

## 2024-08-05 DIAGNOSIS — M4722 Other spondylosis with radiculopathy, cervical region: Secondary | ICD-10-CM | POA: Diagnosis present

## 2024-08-05 DIAGNOSIS — K219 Gastro-esophageal reflux disease without esophagitis: Secondary | ICD-10-CM | POA: Diagnosis present

## 2024-08-05 DIAGNOSIS — J441 Chronic obstructive pulmonary disease with (acute) exacerbation: Secondary | ICD-10-CM | POA: Diagnosis present

## 2024-08-05 DIAGNOSIS — G8929 Other chronic pain: Secondary | ICD-10-CM | POA: Diagnosis present

## 2024-08-05 DIAGNOSIS — I739 Peripheral vascular disease, unspecified: Secondary | ICD-10-CM | POA: Diagnosis present

## 2024-08-05 DIAGNOSIS — Z833 Family history of diabetes mellitus: Secondary | ICD-10-CM | POA: Diagnosis not present

## 2024-08-05 DIAGNOSIS — D696 Thrombocytopenia, unspecified: Secondary | ICD-10-CM | POA: Diagnosis present

## 2024-08-05 LAB — BASIC METABOLIC PANEL WITH GFR
Anion gap: 11 (ref 5–15)
BUN: 9 mg/dL (ref 8–23)
CO2: 28 mmol/L (ref 22–32)
Calcium: 8.7 mg/dL — ABNORMAL LOW (ref 8.9–10.3)
Chloride: 101 mmol/L (ref 98–111)
Creatinine, Ser: 0.71 mg/dL (ref 0.44–1.00)
GFR, Estimated: >60 mL/min
Glucose, Bld: 208 mg/dL — ABNORMAL HIGH (ref 70–99)
Potassium: 3.1 mmol/L — ABNORMAL LOW (ref 3.5–5.1)
Sodium: 140 mmol/L (ref 135–145)

## 2024-08-05 LAB — CBC
HCT: 38.4 % (ref 36.0–46.0)
Hemoglobin: 12.7 g/dL (ref 12.0–15.0)
MCH: 30.4 pg (ref 26.0–34.0)
MCHC: 33.1 g/dL (ref 30.0–36.0)
MCV: 91.9 fL (ref 80.0–100.0)
Platelets: 103 K/uL — ABNORMAL LOW (ref 150–400)
RBC: 4.18 MIL/uL (ref 3.87–5.11)
RDW: 12.1 % (ref 11.5–15.5)
WBC: 4.7 K/uL (ref 4.0–10.5)
nRBC: 0 % (ref 0.0–0.2)

## 2024-08-05 LAB — RESP PANEL BY RT-PCR (RSV, FLU A&B, COVID)  RVPGX2
Influenza A by PCR: NEGATIVE
Influenza B by PCR: NEGATIVE
Resp Syncytial Virus by PCR: NEGATIVE
SARS Coronavirus 2 by RT PCR: POSITIVE — AB

## 2024-08-05 LAB — TROPONIN T, HIGH SENSITIVITY: Troponin T High Sensitivity: <15 ng/L (ref 0–19)

## 2024-08-05 MED ORDER — NICOTINE 21 MG/24HR TD PT24
21.0000 mg | MEDICATED_PATCH | Freq: Every day | TRANSDERMAL | Status: DC
  Administered 2024-08-05 – 2024-08-11 (×7): 21 mg via TRANSDERMAL
  Filled 2024-08-05 (×7): qty 1

## 2024-08-05 MED ORDER — IPRATROPIUM-ALBUTEROL 0.5-2.5 (3) MG/3ML IN SOLN
3.0000 mL | Freq: Four times a day (QID) | RESPIRATORY_TRACT | Status: DC | PRN
  Filled 2024-08-05 (×3): qty 3

## 2024-08-05 MED ORDER — LISINOPRIL 20 MG PO TABS
20.0000 mg | ORAL_TABLET | Freq: Every day | ORAL | Status: DC
  Administered 2024-08-05 – 2024-08-11 (×7): 20 mg via ORAL
  Filled 2024-08-05 (×4): qty 1
  Filled 2024-08-05: qty 2
  Filled 2024-08-05 (×2): qty 1

## 2024-08-05 MED ORDER — IPRATROPIUM-ALBUTEROL 0.5-2.5 (3) MG/3ML IN SOLN
3.0000 mL | Freq: Three times a day (TID) | RESPIRATORY_TRACT | Status: DC
  Administered 2024-08-06 – 2024-08-09 (×10): 3 mL via RESPIRATORY_TRACT
  Filled 2024-08-05 (×10): qty 3

## 2024-08-05 MED ORDER — ATORVASTATIN CALCIUM 40 MG PO TABS
40.0000 mg | ORAL_TABLET | Freq: Every day | ORAL | Status: DC
  Administered 2024-08-05 – 2024-08-11 (×7): 40 mg via ORAL
  Filled 2024-08-05 (×7): qty 1

## 2024-08-05 MED ORDER — AZITHROMYCIN 250 MG PO TABS
500.0000 mg | ORAL_TABLET | Freq: Every day | ORAL | Status: AC
  Administered 2024-08-06 – 2024-08-08 (×3): 500 mg via ORAL
  Filled 2024-08-05 (×3): qty 2

## 2024-08-05 MED ORDER — METHYLPREDNISOLONE SODIUM SUCC 40 MG IJ SOLR
40.0000 mg | Freq: Every day | INTRAMUSCULAR | Status: AC
  Administered 2024-08-05 – 2024-08-08 (×4): 40 mg via INTRAVENOUS
  Filled 2024-08-05 (×4): qty 1

## 2024-08-05 MED ORDER — LORAZEPAM 0.5 MG PO TABS
0.5000 mg | ORAL_TABLET | Freq: Every day | ORAL | Status: DC
  Administered 2024-08-05 – 2024-08-10 (×7): 0.5 mg via ORAL
  Filled 2024-08-05 (×7): qty 1

## 2024-08-05 MED ORDER — SENNOSIDES-DOCUSATE SODIUM 8.6-50 MG PO TABS: 1.0000 | ORAL_TABLET | Freq: Every evening | ORAL | Status: DC | PRN

## 2024-08-05 MED ORDER — ACETAMINOPHEN 650 MG RE SUPP: 650.0000 mg | Freq: Four times a day (QID) | RECTAL | Status: DC | PRN

## 2024-08-05 MED ORDER — GABAPENTIN 100 MG PO CAPS
200.0000 mg | ORAL_CAPSULE | Freq: Once | ORAL | Status: AC
  Administered 2024-08-05: 200 mg via ORAL
  Filled 2024-08-05: qty 2

## 2024-08-05 MED ORDER — SERTRALINE HCL 25 MG PO TABS
25.0000 mg | ORAL_TABLET | Freq: Every day | ORAL | Status: DC
  Administered 2024-08-05 – 2024-08-10 (×7): 25 mg via ORAL
  Filled 2024-08-05 (×7): qty 1

## 2024-08-05 MED ORDER — AZITHROMYCIN 250 MG PO TABS
500.0000 mg | ORAL_TABLET | Freq: Every day | ORAL | Status: DC
  Filled 2024-08-05: qty 2

## 2024-08-05 MED ORDER — IPRATROPIUM-ALBUTEROL 0.5-2.5 (3) MG/3ML IN SOLN
3.0000 mL | Freq: Four times a day (QID) | RESPIRATORY_TRACT | Status: DC
  Administered 2024-08-05 (×4): 3 mL via RESPIRATORY_TRACT
  Filled 2024-08-05 (×4): qty 3

## 2024-08-05 MED ORDER — ARFORMOTEROL TARTRATE 15 MCG/2ML IN NEBU
15.0000 ug | INHALATION_SOLUTION | Freq: Two times a day (BID) | RESPIRATORY_TRACT | Status: DC
  Administered 2024-08-06 – 2024-08-11 (×11): 15 ug via RESPIRATORY_TRACT
  Filled 2024-08-05 (×11): qty 2

## 2024-08-05 MED ORDER — ENOXAPARIN SODIUM 30 MG/0.3ML IJ SOSY
30.0000 mg | PREFILLED_SYRINGE | Freq: Every day | INTRAMUSCULAR | Status: DC
  Administered 2024-08-05 – 2024-08-10 (×6): 30 mg via SUBCUTANEOUS
  Filled 2024-08-05 (×6): qty 0.3

## 2024-08-05 MED ORDER — FAMOTIDINE 20 MG PO TABS
20.0000 mg | ORAL_TABLET | Freq: Two times a day (BID) | ORAL | Status: DC
  Administered 2024-08-05 – 2024-08-11 (×11): 20 mg via ORAL
  Filled 2024-08-05 (×12): qty 1

## 2024-08-05 MED ORDER — ALBUTEROL SULFATE (2.5 MG/3ML) 0.083% IN NEBU: 2.5000 mg | INHALATION_SOLUTION | RESPIRATORY_TRACT | Status: DC

## 2024-08-05 MED ORDER — ONDANSETRON HCL 4 MG PO TABS: 4.0000 mg | ORAL_TABLET | Freq: Four times a day (QID) | ORAL | Status: DC | PRN

## 2024-08-05 MED ORDER — METHYLPREDNISOLONE SODIUM SUCC 125 MG IJ SOLR: 125.0000 mg | Freq: Every day | INTRAMUSCULAR | Status: DC

## 2024-08-05 MED ORDER — MENTHOL 3 MG MT LOZG
1.0000 | LOZENGE | OROMUCOSAL | Status: DC | PRN
  Administered 2024-08-05 – 2024-08-08 (×2): 3 mg via ORAL
  Filled 2024-08-05 (×2): qty 9

## 2024-08-05 MED ORDER — BUDESONIDE 0.25 MG/2ML IN SUSP
0.2500 mg | Freq: Two times a day (BID) | RESPIRATORY_TRACT | Status: DC
  Administered 2024-08-06 – 2024-08-11 (×11): 0.25 mg via RESPIRATORY_TRACT
  Filled 2024-08-05 (×11): qty 2

## 2024-08-05 MED ORDER — ONDANSETRON HCL 4 MG/2ML IJ SOLN: 4.0000 mg | Freq: Four times a day (QID) | INTRAMUSCULAR | Status: DC | PRN

## 2024-08-05 MED ORDER — ACETAMINOPHEN 325 MG PO TABS
650.0000 mg | ORAL_TABLET | Freq: Four times a day (QID) | ORAL | Status: DC | PRN
  Administered 2024-08-06 – 2024-08-09 (×2): 650 mg via ORAL
  Filled 2024-08-05 (×2): qty 2

## 2024-08-05 MED ORDER — BISACODYL 5 MG PO TBEC
5.0000 mg | DELAYED_RELEASE_TABLET | Freq: Every day | ORAL | Status: DC | PRN
  Administered 2024-08-11: 5 mg via ORAL
  Filled 2024-08-05: qty 1

## 2024-08-05 MED ORDER — POTASSIUM CHLORIDE CRYS ER 20 MEQ PO TBCR
40.0000 meq | EXTENDED_RELEASE_TABLET | Freq: Once | ORAL | Status: AC
  Administered 2024-08-06: 40 meq via ORAL
  Filled 2024-08-05: qty 2

## 2024-08-05 MED ORDER — CLOPIDOGREL BISULFATE 75 MG PO TABS
75.0000 mg | ORAL_TABLET | Freq: Every day | ORAL | Status: DC
  Administered 2024-08-05 – 2024-08-11 (×7): 75 mg via ORAL
  Filled 2024-08-05 (×7): qty 1

## 2024-08-05 MED ORDER — GUAIFENESIN 100 MG/5ML PO LIQD
5.0000 mL | ORAL | Status: DC | PRN
  Administered 2024-08-08: 5 mL via ORAL
  Filled 2024-08-05: qty 10

## 2024-08-05 NOTE — Evaluation (Signed)
 Physical Therapy Evaluation Patient Details Name: Leah [name] MRN: [id number] DOB: 10/26/1945 Today's Date: 08/05/2024  History of Present Illness  79 yo female admitted with COPD exac, COVID+. Hx of COPD, essential HTN, CAD  Clinical Impression  On eval, pt required Min A for mobility. She ambulated ~15 feet in room with 1 HHA. Pt presents with general weakness, decreased activity tolerance, and impaired gait/balance. She was quite shaky and unsteady during session. Discussed d/c plan-pt plans to return home where she lives alone. Will recommend HHPT f/u at this time-may not need if she progresses well.        If plan is discharge home, recommend the following: A little help with walking and/or transfers;A little help with bathing/dressing/bathroom;Assistance with cooking/housework;Assist for transportation;Help with stairs or ramp for entrance   Can travel by private vehicle        Equipment Recommendations None recommended by PT (has access to a RW)  Recommendations for Other Services       Functional Status Assessment Patient has had a recent decline in their functional status and demonstrates the ability to make significant improvements in function in a reasonable and predictable amount of time.     Precautions / Restrictions Precautions Precautions: Fall Precaution/Restrictions Comments: monitor O2 Restrictions Weight Bearing Restrictions Per Provider Order: No      Mobility  Bed Mobility Overal bed mobility: Needs Assistance Bed Mobility: Supine to Sit, Sit to Supine     Supine to sit: Supervision, HOB elevated Sit to supine: Supervision, HOB elevated   General bed mobility comments: for lines, safety    Transfers Overall transfer level: Needs assistance Equipment used: 1 person hand held assist Transfers: Sit to/from Stand Sit to Stand: Min assist           General transfer comment: Steadying assist. 1 LOB back onto bed     Ambulation/Gait Ambulation/Gait assistance: Min assist Gait Distance (Feet): 15 Feet Assistive device: 1 person hand held assist Gait Pattern/deviations: Step-through pattern, Decreased stride length       General Gait Details: Pt walked across room and back with 1 HHA. Unsteady. Remained on [place] O2-95%.  Stairs            Wheelchair Mobility     Tilt Bed    Modified Rankin (Stroke Patients Only)       Balance Overall balance assessment: Needs assistance         Standing balance support: Single extremity supported, During functional activity Standing balance-Leahy Scale: Fair                               Pertinent Vitals/Pain Pain Assessment Pain Assessment: No/denies pain    Home Living Family/patient expects to be discharged to:: Private residence Living Arrangements: Alone Available Help at Discharge: Family Type of Home: Mobile home Home Access: Stairs to enter Entrance Stairs-Rails: Left Entrance Stairs-Number of Steps: 5   Home Layout: One level Home Equipment: None Additional Comments: has access to RW    Prior Function Prior Level of Function : Independent/Modified Independent;Driving                     Extremity/Trunk Assessment   Upper Extremity Assessment Upper Extremity Assessment: Defer to OT evaluation    Lower Extremity Assessment Lower Extremity Assessment: Generalized weakness    Cervical / Trunk Assessment Cervical / Trunk Assessment: Kyphotic  Communication   Communication Communication: No apparent difficulties  Cognition Arousal: Alert Behavior During Therapy: WFL for tasks assessed/performed   PT - Cognitive impairments: No apparent impairments                         Following commands: Intact       Cueing Cueing Techniques: Verbal cues     General Comments      Exercises     Assessment/Plan    PT Assessment Patient needs continued PT services  PT Problem List  Decreased mobility;Decreased activity tolerance;Decreased balance;Decreased strength       PT Treatment Interventions DME instruction;Gait training;Functional mobility training;Therapeutic activities;Therapeutic exercise;Patient/family education;Balance training    PT Goals (Current goals can be found in the Care Plan section)  Acute Rehab PT Goals Patient Stated Goal: get better and return home PT Goal Formulation: With patient/family Time For Goal Achievement: 08/19/24 Potential to Achieve Goals: Good    Frequency Min 3X/week     Co-evaluation               AM-PAC PT 6 Clicks Mobility  Outcome Measure Help needed turning from your back to your side while in a flat bed without using bedrails?: None Help needed moving from lying on your back to sitting on the side of a flat bed without using bedrails?: None Help needed moving to and from a bed to a chair (including a wheelchair)?: A Little Help needed standing up from a chair using your arms (e.g., wheelchair or bedside chair)?: A Little Help needed to walk in hospital room?: A Little Help needed climbing 3-5 steps with a railing? : A Little 6 Click Score: 20    End of Session Equipment Utilized During Treatment: Oxygen Activity Tolerance: Patient tolerated treatment well Patient left: in bed;with call bell/phone within reach   PT Visit Diagnosis: Difficulty in walking, not elsewhere classified (R26.2);Unsteadiness on feet (R26.81)    Time: 8354-8344 PT Time Calculation (min) (ACUTE ONLY): 10 min   Charges:   PT Evaluation $PT Eval Low Complexity: 1 Low   PT General Charges $$ ACUTE PT VISIT: 1 Visit           Dannial SQUIBB, PT Acute Rehabilitation  Office: [phone]

## 2024-08-05 NOTE — H&P (Addendum)
 " History and Physical  ABEL RA FMW:[id number] DOB: 02/21/1946 DOA: 08/04/2024  PCP: Antonio Cyndee [name], DO   Chief Complaint: Shortness of breath  HPI: Leah [name] is a 79 y.o. female with medical history significant for COPD, HLD, HTN, CAD, tobacco use disorder, cervical spondylosis, cervical radiculopathy, GERD, Barrett's esophagus, anxiety and depression who presents to [facility] ED for evaluation of shortness of breath and burning discomfort. Patient reports she started having some fevers, cough and a headache last Monday so she presented to the urgent care on Wednesday and was diagnosed with COVID. She was prescribed a 5-day course of molnupiravir due to other drug interactions. Over the last few days, she has had some burning sensation in her neck and over shoulders arms and face bilaterally.  Reports she does have a history of burning sensation down her shoulders and arms from her chronic back pain but receives steroid injections intermittently. Patient continues to endorse cough and dyspnea on exertion but denies any fevers, chills, nausea, vomiting, chest pain or abdominal pain.  Reports she continues to smoke about 1 pack of cigarettes per day has been smoking since she was a teenager.  [facility] ED Course: Patient is hemodynamically stable. CBC unremarkable except low platelet 130. BMP showing elevated blood glucose 169 otherwise unremarkable.  Normal troponin. Respiratory panel positive with COVID (initially diagnosed 5 days ago). CXR no acute disease process. Moderate emphysema.   In the ED patient received Solu-Medrol , and DuoNeb belies her. Hospitalist consulted for further  management of COPD exacerbation.  Review of Systems: Please see HPI for pertinent positives and negatives. A complete 10 system review of systems are otherwise negative.  Past Medical History:  Diagnosis Date   Allergy    ANEMIA, IRON DEFICIENCY 03/19/2009   Qualifier: Diagnosis of   By: Antonio ROSALEA Jamee         Anxiety    Barrett esophagus    Bladder pain    Cataract    Chronic bronchitis (HCC)    per pt last episode july 2016   Complication of anesthesia    slow to wake   COPD (chronic obstructive pulmonary disease) (HCC)    Depression    Diverticulosis of colon (without mention of hemorrhage) 03/14/2007   Centricity Description: DIVERTICULOSIS, COLON  Qualifier: Diagnosis of   By: Lewellyn CMA (AAMA), Amanda      Centricity Description: DIVERTICULOSIS-COLON  Qualifier: Diagnosis of   By: Ever Riggers, Amy S     Replacing diagnoses that were inactivated after the 10/30/22 regulatory import     Exocrine pancreatic insufficiency    Frequency of urination    GERD (gastroesophageal reflux disease)    Hiatal hernia    History of adenomatous polyp of colon    tubular adenoma   History of chronic gastritis    History of diverticulitis 03/02/2014   History of diverticulitis of colon    History of esophageal dilatation    for stricture   History of esophageal spasm    Hyperlipidemia    Hypertension    IBS 11/13/2007   Qualifier: Diagnosis of   By: Lewellyn CMA (AAMA), Amanda         IBS (irritable bowel syndrome)    Interstitial cystitis    PAD (peripheral artery disease)    Partial small bowel obstruction (HCC) 09/04/2019   Productive cough    intermittant   SBO (small bowel obstruction) (HCC) 09/19/2019   Smokers' cough (HCC)    Urgency of urination  Past Surgical History:  Procedure Laterality Date   ABDOMINAL AORTOGRAM N/A 10/14/2018   Procedure: ABDOMINAL AORTOGRAM;  Surgeon: Court Dorn PARAS, MD;  Location[facility] INVASIVE CV LAB;  Service: Cardiovascular;  Laterality: N/A;   ABDOMINAL HYSTERECTOMY  1975   ANTERIOR CERVICAL DECOMP/DISCECTOMY FUSION N/A 09/07/2014   Procedure: Exploration of Fusion and Removal of Anterior Cervical Hematoma;  Surgeon: Reyes Budge, MD;  Location: [facility] NEURO ORS;  Service: Neurosurgery;  Laterality: N/A;   CARPAL TUNNEL RELEASE Right  1993   COLONOSCOPY  last one 04-30-2013   COLONOSCOPY WITH PROPOFOL  N/A 09/09/2019   Procedure: COLONOSCOPY WITH PROPOFOL ;  Surgeon: Legrand Victory LITTIE DOUGLAS, MD;  Location: [facility] ENDOSCOPY;  Service: Gastroenterology;  Laterality: N/A;   CYSTO WITH HYDRODISTENSION N/A 06/08/2015   Procedure: CYSTOSCOPY/HYDRODISTENSION INSTILLATION OF MARCAINE  AND PYRIDIUM ;  Surgeon: Norleen Seltzer, MD;  Location[facility];  Service: Urology;  Laterality: N/A;   CYSTO/  HOD/  BLADDER BX  1990's   ESOPHAGOGASTRODUODENOSCOPY  last one 09-11-2012   LAPAROSCOPIC APPENDECTOMY N/A 09/21/2013   Procedure: APPENDECTOMY LAPAROSCOPIC;  Surgeon: Donnice Bury, MD;  Location: [facility] OR;  Service: General;  Laterality: N/A;   LAPAROSCOPIC CHOLECYSTECTOMY  2002   LAPAROSCOPY SIGMOID COLECTOMY  10-05-2008   diverticulitis   LOWER EXTREMITY ANGIOGRAPHY Left 10/14/2018   LOWER EXTREMITY ANGIOGRAPHY Bilateral 10/14/2018   Procedure: Lower Extremity Angiography;  Surgeon: Court Dorn PARAS, MD;  Location: [facility] INVASIVE CV LAB;  Service: Cardiovascular;  Laterality: Bilateral;  ILIACS   MICROLARYNGOSCOPY  09-27-2006   w/ true vocal cord stripping  and bilateral bx's of lesion's (benign)   PERIPHERAL VASCULAR INTERVENTION  10/14/2018   Procedure: PERIPHERAL VASCULAR INTERVENTION;  Surgeon: Court Dorn PARAS, MD;  Location: [facility] INVASIVE CV LAB;  Service: Cardiovascular;;   SINUS SURGERY WITH INSTATRAK  1991   TEMPOROMANDIBULAR JOINT SURGERY  1990   TONSILLECTOMY AND ADENOIDECTOMY  1964   Social History:  reports that she has been smoking cigarettes. She started smoking about 2 years ago. She has a 36.3 pack-year smoking history. She has never used smokeless tobacco. She reports that she does not drink alcohol and does not use drugs.  Allergies[1]  Family History  Problem Relation Age of Onset   Pancreatic cancer Mother    Colon polyps Mother    Pancreatic cancer Father    Diabetes Father    Heart disease Father    Leukemia  Father    Prostate cancer Father    Hypertension Father    Heart disease Brother    Heart attack Brother    Other Daughter        prediabetes   Heart disease Daughter    Diabetes Daughter    Colon cancer Neg Hx    Esophageal cancer Neg Hx    Rectal cancer Neg Hx    Stomach cancer Neg Hx      Prior to Admission medications  Medication Sig Start Date End Date Taking? Authorizing Provider  benzonatate  (TESSALON ) 100 MG capsule Take 200 mg by mouth every 8 (eight) hours as needed. 07/30/24  Yes [provider]  LAGEVRIO 200 MG CAPS capsule Take 4 capsules by mouth every 12 (twelve) hours. 07/30/24  Yes [provider]  albuterol  (VENTOLIN  HFA) 108 (90 Base) MCG/ACT inhaler TAKE 2 PUFFS BY MOUTH EVERY 6 HOURS AS NEEDED 06/08/23 07/01/24  Saguier, [place], PA-C  atorvastatin  (LIPITOR ) 40 MG tablet Take 1 tablet (40 mg total) by mouth daily. 04/21/24   Court Dorn PARAS, MD  clopidogrel  (  PLAVIX ) 75 MG tablet TAKE 1 TABLET EVERY DAY 06/24/24   Hilty, Vinie BROCKS, MD  dicyclomine  (BENTYL ) 10 MG capsule TAKE 1 CAPSULE BY MOUTH AS NEEDED FOR UP TO 1 DOSE FOR SPASMS. 06/12/22   Legrand Victory LITTIE DOUGLAS, MD  Digestive Enzymes (DIGESTIVE ENZYME PO) Take by mouth daily. Patient taking after each meal    [provider]  estradiol  (CLIMARA  - DOSED IN MG/24 HR) 0.1 mg/24hr patch Place 0.1 mg onto the skin every Monday. 02/08/16   [provider]  Evolocumab  (REPATHA  SURECLICK) 140 MG/ML SOAJ INJECT 1 DOSE INTO THE SKIN EVERY 14 (FOURTEEN) DAYS. 12/03/23   Mona Vinie BROCKS, MD  ezetimibe  (ZETIA ) 10 MG tablet TAKE 1 TABLET EVERY DAY 06/28/24   Antonio Meth, Yvonne R, DO  famotidine  (PEPCID ) 20 MG tablet TAKE 1 TABLET TWICE DAILY 06/28/24   Lowne Chase, Yvonne R, DO  fluticasone  (FLONASE ) 50 MCG/ACT nasal spray Place 2 sprays into both nostrils daily. 04/11/24   Antonio Meth [name], DO  linaclotide  (LINZESS ) 72 MCG capsule Take 1 capsule (72 mcg total) by mouth daily before breakfast.  05/12/24   Armbruster, Elspeth SQUIBB, MD  lisinopril  (ZESTRIL ) 20 MG tablet Take 1 tablet (20 mg total) by mouth daily. 05/19/24   Antonio Meth [name], DO  loratadine  (CLARITIN ) 10 MG tablet Take 1 tablet (10 mg total) by mouth daily. 11/27/23   Antonio Meth [name], DO  LORazepam  (ATIVAN ) 0.5 MG tablet Take 0.5 mg by mouth at bedtime.  12/04/14   [provider]  ondansetron  (ZOFRAN -ODT) 4 MG disintegrating tablet Take 1 tablet (4 mg total) by mouth every 6 (six) hours as needed for nausea or vomiting. 01/24/24   Armbruster, Elspeth SQUIBB, MD  pantoprazole  (PROTONIX ) 40 MG tablet Take 1 tablet (40 mg total) by mouth daily. 03/21/24   Lowne Chase, Yvonne R, DO  polyethylene glycol (MIRALAX ) 17 g packet Take 17 g by mouth 2 (two) times daily. 09/09/19   Briana Elgin LABOR, MD  sertraline  (ZOLOFT ) 50 MG tablet Take 25 mg by mouth at bedtime.     [provider]  sucralfate  (CARAFATE ) 1 g tablet Take 1 tablet by mouth 3 (three) times daily. 12/03/23   [provider]  Vitamin D , Ergocalciferol , (DRISDOL ) 1.25 MG (50000 UNIT) CAPS capsule TAKE 1 CAPSULE EVERY WEEK 02/07/24   Antonio Meth [name], DO    Physical Exam: BP (!) 119/49 (BP Location: Left Arm)   Pulse 79   Temp 98.3 F (36.8 C) (Oral)   Resp 19   Ht 5' 1 (1.549 m)   Wt 39.9 kg   SpO2 92%   BMI 16.63 kg/m  General: Pleasant, well-appearing elderly woman laying in bed. No acute distress. HEENT: [place]/AT. Anicteric sclera CV: RRR. No murmurs, rubs, or gallops. No LE edema Pulmonary: On 2 L [place]. Mild tachypnea. Lungs CTAB. Normal effort. No wheezing or rales. Abdominal: Soft, nontender, nondistended. Normal bowel sounds. Decreased breath sounds throughout. Extremities: Palpable radial and DP pulses. Normal ROM. Skin: Warm and dry. No obvious rash or lesions. Neuro: A&Ox3. Moves all extremities. Normal sensation to light touch. No focal deficit. Psych: Normal mood and affect          Labs on Admission:  Basic Metabolic  Panel: Recent Labs  Lab 08/04/24 2058 08/05/24 0604  NA 139 140  K 4.0 3.1*  CL 100 101  CO2 26 28  GLUCOSE 169* 208*  BUN 11 9  CREATININE 0.81 0.71  CALCIUM  8.8*  8.7*   Liver Function Tests: No results for input(s): AST, ALT, ALKPHOS, BILITOT, PROT, ALBUMIN in the last 168 hours. No results for input(s): LIPASE, AMYLASE in the last 168 hours. No results for input(s): AMMONIA in the last 168 hours. CBC: Recent Labs  Lab 08/04/24 2058 08/05/24 0604  WBC 5.9 4.7  HGB 14.0 12.7  HCT 42.8 38.4  MCV 92.0 91.9  PLT 130* 103*   Cardiac Enzymes: No results for input(s): CKTOTAL, CKMB, CKMBINDEX, TROPONINI in the last 168 hours. BNP (last 3 results) No results for input(s): BNP in the last 8760 hours.  ProBNP (last 3 results) No results for input(s): PROBNP in the last 8760 hours.  CBG: No results for input(s): GLUCAP in the last 168 hours.  Radiological Exams on Admission: DG Chest 2 View Result Date: 08/04/2024 EXAM: 2 VIEW(S) XRAY OF THE CHEST 08/04/2024 09:13:00 PM COMPARISON: PA and lateral chest 09/24/2023. CLINICAL HISTORY: Shortness of breath, fever, and coughing with general malaise. Positive COVID-19 diagnosis. FINDINGS: LUNGS AND PLEURA: The lungs are moderately emphysematous. No focal pulmonary opacity. No pleural effusion. No pneumothorax. HEART AND MEDIASTINUM: The cardiac size is normal. There is patchy calcification of the aorta. The mediastinum is normally outlined. BONES AND SOFT TISSUES: Osteopenia and thoracic kyphosis. 3-level lower cervical ACDF plating hardware is again seen. IMPRESSION: 1. No acute radiographic findings. 2. Moderate emphysema. Stable COPD chest . Electronically signed by: Francis Quam MD 08/04/2024 09:19 PM EST RP Workstation: [id number]   Assessment/Plan DAPHNEY HOPKE is a 79 y.o. female with medical history significant for COPD, HLD, HTN, CAD, tobacco use disorder, cervical spondylosis, cervical  radiculopathy, GERD, Barrett's esophagus, anxiety and depression who presents to [facility] ED for evaluation of shortness of breath and burning discomfort and admitted for COPD exacerbation.  # COPD with acute exacerbation - Presented with persistent dyspnea on exertion and cough after recent COVID infection - Pt with decreased breath sounds on lung auscultation - CXR with no evidence of PNA; positive for COVID but close completed 5-day course of treatment - S/p IV solumedrol, multiple DuoNebs in the ED - Continue IV Solu-Medrol  40 mg daily and azithromycin  500 mg daily x 3 doses - Start daily Pulmicort  and Brovana  - As needed duonebs and Robitussin - Supplemental O2 as needed   # COVID-19 infection - Patient initially diagnosed with COVID on 12/31 with symptom onset 2 days prior - She has completed 5-day course of molnupiravir  - Symptomatic management with as needed Robitussin for cough - Continue droplet precautions  # Acute hypoxic respiratory failure - Patient found to be hypoxic with ambulation in the ED, placed on 2 L [place] - Continue supplemental O2, wean as able  # Hypokalemia - K+  low at 3.1, repleted with KCl 40 mEq x1 - Follow-up morning K+ and mag  # HTN - BP stable with SBP in the 100-110s - Continue lisinopril   # HLD - Continue atorvastatin   # CAD - Continue Plavix  and atorvastatin   # Cervical spondylosis # Cervical radiculopathy - Reports chronic neck pain with associated burning sensation down bilateral shoulders and arms managed with intermittent steroid injections - Scheduled for repeat steroid injection on January 13th - IV steroids as above  # GERD - Continue famotidine   # Anxiety and depression - Continue sertraline  and Ativan   # Tobacco use disorder - Reports smoking 1 pack/day of cigarettes,  - Patient educated and counseled on the importance of smoking cessation, not ready to quit but will work on cutting  down - Start nicotine  patch  DVT  prophylaxis: Lovenox      Code Status: Full Code  Consults called: None  Family Communication: Discussed results/findings and plan for admission with daughter at bedside  Severity of Illness: The appropriate patient status for this patient is INPATIENT. Inpatient status is judged to be reasonable and necessary in order to provide the required intensity of service to ensure the patient's safety. The patient's presenting symptoms, physical exam findings, and initial radiographic and laboratory data in the context of their chronic comorbidities is felt to place them at high risk for further clinical deterioration. Furthermore, it is not anticipated that the patient will be medically stable for discharge from the hospital within 2 midnights of admission.   * I certify that at the point of admission it is my clinical judgment that the patient will require inpatient hospital care spanning beyond 2 midnights from the point of admission due to high intensity of service, high risk for further deterioration and high frequency of surveillance required.*  Level of care: Telemetry   I personally spent a total of 75 minutes in the care of the patient today including preparing to see the patient, getting/reviewing separately obtained history, performing a medically appropriate exam/evaluation, counseling and educating, placing orders, documenting clinical information in the EHR, and communicating results.   Lou Claretta HERO, MD 08/05/2024, 10:55 PM [facility] Pager: [phone] Isaiah 41:10   If 7PM-7AM, please contact night-coverage [email or web address] Password [email or web address]     [1]  Allergies Allergen Reactions   Amoxicillin-Pot Clavulanate Shortness Of Breath, Itching and Swelling   Avelox [Moxifloxacin Hcl In Nacl] Shortness Of Breath, Itching and Swelling   Ciprofloxacin Shortness Of Breath, Itching and Swelling   Keflex  [Cephalexin ] Shortness Of Breath and Swelling   Moxifloxacin Anaphylaxis,  Shortness Of Breath, Itching, Swelling and Other (See Comments)    Throat swells   Penicillins Anaphylaxis, Shortness Of Breath, Itching, Swelling and Other (See Comments)    Did it involve swelling of the face/tongue/throat, SOB, or low BP? Yes Did it involve sudden or severe rash/hives, skin peeling, or any reaction on the inside of your mouth or nose? No Did you need to seek medical attention at a hospital or doctor's office? Yes When did it last happen? Several years ago- throat swells   Sulfa Antibiotics Shortness Of Breath, Itching and Swelling   Clindamycin /Lincomycin Itching and Swelling   Levaquin  [Levofloxacin ] Hives   Metronidazole  Other (See Comments)    Pt unsure of reaction   Prednisone  Other (See Comments)    GI irritation; is able to tolerate injections   Tizanidine  Other (See Comments)    Pt unsure of reaction   Tramadol  Itching   Amoxicillin Other (See Comments)    Reaction??   Clavulanic Acid Other (See Comments)    Reaction??   Dextrose  Other (See Comments)    Reaction??   Sulfur  Other (See Comments)    Reaction??   Acyclovir And Related Other (See Comments)    Pt does not recall this reaction   Hydrocodone  Rash   Zyvox  [Linezolid ] Diarrhea   "

## 2024-08-05 NOTE — ED Notes (Signed)
 Called [facility] for transport to [facility] @13 :08.   Spoke with Luke

## 2024-08-05 NOTE — ED Notes (Signed)
 SP02 while ambulating. Patient maintained Sp02 93-94% during ambulation and heart rate maintained between 101-104 bpm.

## 2024-08-05 NOTE — Plan of Care (Addendum)
 [facility] to [facility] or [facility] telemetry bed transfer  79 year old female history of COPD, chronic smoking cigarette, hyperlipidemia, essential hypertension and history of CAD presents emergency department complaining of shortness of breath and burning discomfort.he was diagnosed at an outside urgent care with COVID 5 days ago. She was started on molnupiravir due to other drug interactions. She states that the burning actually began 2 days prior to starting this medication but worsened while on it. She went back to urgent care today and staff recommended that she present to the emergency department both for the burning symptoms as well as the worsening shortness of breath. She has not developed a rash. Burning is mainly over both shoulders, arms, face bilaterally.   At presentation to ED patient is hemodynamically stable.  Lab work, CBC unremarkable except low platelet 130.  BMP showing elevated blood glucose 169 otherwise unremarkable.  Normal troponin. Respiratory panel positive with COVID (initially diagnosed 5 days ago).  Chest x-ray no acute disease process.  Moderate emphysema.  In the ED patient received Solu-Medrol , and DuoNeb belies her.  Hospitalist consulted for further  management of COPD exacerbation.    [facility] will assume care on arrival to accepting facility. Until arrival, care as per EDP. However, [facility] available 24/7 for questions and assistance. Check [email or web address] for on-call coverage. Nursing staff, please call [facility] Admits & Consults System-Wide number under Amion on patient's arrival so appropriate admitting provider can evaluate the pt.   Author: Tully Burgo, MD  [facility]

## 2024-08-06 DIAGNOSIS — J441 Chronic obstructive pulmonary disease with (acute) exacerbation: Secondary | ICD-10-CM | POA: Diagnosis not present

## 2024-08-06 LAB — BASIC METABOLIC PANEL WITH GFR
Anion gap: 7 (ref 5–15)
BUN: 10 mg/dL (ref 8–23)
CO2: 29 mmol/L (ref 22–32)
Calcium: 8.7 mg/dL — ABNORMAL LOW (ref 8.9–10.3)
Chloride: 104 mmol/L (ref 98–111)
Creatinine, Ser: 0.66 mg/dL (ref 0.44–1.00)
GFR, Estimated: >60 mL/min
Glucose, Bld: 103 mg/dL — ABNORMAL HIGH (ref 70–99)
Potassium: 4 mmol/L (ref 3.5–5.1)
Sodium: 140 mmol/L (ref 135–145)

## 2024-08-06 LAB — IRON AND TIBC
Iron: 54 ug/dL (ref 28–170)
Saturation Ratios: 22 % (ref 10.4–31.8)
TIBC: 249 ug/dL — ABNORMAL LOW (ref 250–450)
UIBC: 195 ug/dL

## 2024-08-06 LAB — FOLATE: Folate: 11.7 ng/mL

## 2024-08-06 LAB — CBC
HCT: 34.5 % — ABNORMAL LOW (ref 36.0–46.0)
Hemoglobin: 11.5 g/dL — ABNORMAL LOW (ref 12.0–15.0)
MCH: 31.1 pg (ref 26.0–34.0)
MCHC: 33.3 g/dL (ref 30.0–36.0)
MCV: 93.2 fL (ref 80.0–100.0)
Platelets: 131 K/uL — ABNORMAL LOW (ref 150–400)
RBC: 3.7 MIL/uL — ABNORMAL LOW (ref 3.87–5.11)
RDW: 12.3 % (ref 11.5–15.5)
WBC: 8.1 K/uL (ref 4.0–10.5)
nRBC: 0 % (ref 0.0–0.2)

## 2024-08-06 LAB — RETICULOCYTES
Immature Retic Fract: 13.5 % (ref 2.3–15.9)
RBC.: 3.7 MIL/uL — ABNORMAL LOW (ref 3.87–5.11)
Retic Count, Absolute: 34 K/uL (ref 19.0–186.0)
Retic Ct Pct: 0.9 % (ref 0.4–3.1)

## 2024-08-06 LAB — VITAMIN B12: Vitamin B-12: 866 pg/mL (ref 180–914)

## 2024-08-06 LAB — FERRITIN: Ferritin: 89 ng/mL (ref 11–307)

## 2024-08-06 NOTE — Progress Notes (Signed)
 Occupational Therapy Evaluation Patient Details Name: Leah [name] MRN: [id number] DOB: 03-08-1946 Today's Date: 08/06/2024   History of Present Illness   79 yr old female admitted with shortness of breath. She was found to have COPD exacerbation and COVID-19. PMH: COPD, HTN, CAD, cervical spondylosis and radiculopathy     Clinical Impressions The pt is currently presenting below her baseline level of functioning for self-care management; she is typically independent with ADLs, cooking, cleaning, and driving. She is limited by the below listed deficits (see OT problem list). During the session today, she required SBA to CGA for functional tasks, including sit to stand, lower body dressing, toileting at bathroom level, and upper body grooming standing at the sink. She reported feelings of generalized weakness, fatigue, shakiness, and wobbliness in her legs. She will benefit from further OT services to maximize her independence with ADLs and to decrease the risk for further weakness and deconditioning. Home health OT is recommended.      If plan is discharge home, recommend the following:   Assistance with cooking/housework;Help with stairs or ramp for entrance     Functional Status Assessment   Patient has had a recent decline in their functional status and demonstrates the ability to make significant improvements in function in a reasonable and predictable amount of time.     Equipment Recommendations   None recommended by OT     Recommendations for Other Services         Precautions/Restrictions   Precautions Precaution/Restrictions Comments: monitor O2 Restrictions Weight Bearing Restrictions Per Provider Order: No     Mobility Bed Mobility Overal bed mobility: Modified Independent Bed Mobility: Supine to Sit     Supine to sit: Modified independent (Device/Increase time), Used rails          Transfers Overall transfer level: Needs  assistance Equipment used: None Transfers: Sit to/from Stand Sit to Stand: Supervision                  Balance     Sitting balance-Leahy Scale: Good       Standing balance-Leahy Scale: Fair           ADL either performed or assessed with clinical judgement   ADL Overall ADL's : Needs assistance/impaired Eating/Feeding: Independent;Sitting   Grooming: Supervision/safety;Set up;Standing Grooming Details (indicate cue type and reason): She performed hand washing and face washing standing at the sink. Upper Body Bathing: Set up;Sitting   Lower Body Bathing: Contact guard assist;Sit to/from stand   Upper Body Dressing : Set up;Sitting   Lower Body Dressing: Set up;Supervision/safety;Sitting/lateral leans Lower Body Dressing Details (indicate cue type and reason): She implemented the figure 4 technique to donn her socks seated EOB. Toilet Transfer: Set up;Supervision/safety;Ambulation;Rolling walker (2 wheels);Grab bars Toilet Transfer Details (indicate cue type and reason): She ambulated to and from the bathroom in her room using a RW. Toileting- Clothing Manipulation and Hygiene: Contact guard assist;Sit to/from stand Toileting - Clothing Manipulation Details (indicate cue type and reason): Pt performed toileting management at bathroom level.             Vision   Additional Comments: She correctly read the time depicted on the wall clock.            Pertinent Vitals/Pain Pain Assessment Pain Assessment: 0-10 Pain Location: She reported having intermittent pain at her face, back, and shoulders. She did not quantify her pain, however described it as burning.  She also reported having a headache. Pain Intervention(s): Monitored  during session, Patient requesting pain meds-RN notified     Extremity/Trunk Assessment Upper Extremity Assessment Upper Extremity Assessment: LUE deficits/detail;RUE deficits/detail;Right hand dominant RUE Deficits / Details: AROM  WFL. Grip strength 4+/5 LUE Deficits / Details: AROM WFL. Grip strength 4+/5   Lower Extremity Assessment Lower Extremity Assessment: Overall WFL for tasks assessed;LLE deficits/detail;RLE deficits/detail RLE Deficits / Details: AROM WFL LLE Deficits / Details: AROM WFL       Communication Communication Communication: No apparent difficulties   Cognition Arousal: Alert Behavior During Therapy: WFL for tasks assessed/performed Cognition: No apparent impairments             OT - Cognition Comments: Oriented x4                 Following commands: Intact       Cueing  General Comments   Cueing Techniques: Verbal cues              Home Living Family/patient expects to be discharged to:: Private residence Living Arrangements: Alone Available Help at Discharge: Family;Available PRN/intermittently Type of Home: Mobile home Home Access: Stairs to enter Entrance Stairs-Number of Steps: 4 Entrance Stairs-Rails: Right Home Layout: One level     Bathroom Shower/Tub: Tub/shower unit         Home Equipment: BSC/3in1;Shower seat          Prior Functioning/Environment Prior Level of Function : Independent/Modified Independent;Driving               ADLs Comments: Independent with ADLs, cooking, cleaning, and driving.    OT Problem List: Decreased strength;Decreased activity tolerance;Impaired balance (sitting and/or standing);Decreased knowledge of use of DME or AE;Pain   OT Treatment/Interventions: Self-care/ADL training;Therapeutic exercise;Energy conservation;DME and/or AE instruction;Therapeutic activities;Balance training;Patient/family education      OT Goals(Current goals can be found in the care plan section)   Acute Rehab OT Goals Patient Stated Goal: to get stronger and to stay in the hospital a couple more days before returning home OT Goal Formulation: With patient Time For Goal Achievement: 08/20/24 Potential to Achieve Goals:  Good ADL Goals Pt Will Perform Grooming: with modified independence;standing Pt Will Perform Lower Body Dressing: with modified independence;sit to/from stand Pt Will Transfer to Toilet: with modified independence;ambulating Pt Will Perform Toileting - Clothing Manipulation and hygiene: with modified independence;sit to/from stand   OT Frequency:  Min 2X/week       AM-PAC OT 6 Clicks Daily Activity     Outcome Measure Help from another person eating meals?: None Help from another person taking care of personal grooming?: A Little Help from another person toileting, which includes using toliet, bedpan, or urinal?: A Little Help from another person bathing (including washing, rinsing, drying)?: A Little Help from another person to put on and taking off regular upper body clothing?: None Help from another person to put on and taking off regular lower body clothing?: A Little 6 Click Score: 20   End of Session Equipment Utilized During Treatment: Rolling walker (2 wheels);Oxygen Nurse Communication: Patient requests pain meds;Other (comment) (Patient inquiring about staying in the hospital until Friday)  Activity Tolerance: Patient tolerated treatment well Patient left: in chair;with call bell/phone within reach  OT Visit Diagnosis: Muscle weakness (generalized) (M62.81);Pain Pain - part of body:  (head, back, shoulders, face)                Time: 1020-1040 OT Time Calculation (min): 20 min Charges:  OT General Charges $OT Visit: 1 Visit OT Evaluation $OT  Eval Moderate Complexity: 1 Mod    Delanna JINNY Lesches, OTR/L 08/06/2024, 11:41 AM

## 2024-08-06 NOTE — Plan of Care (Signed)
  Problem: Education: Goal: Knowledge of risk factors and measures for prevention of condition will improve Outcome: Progressing   Problem: Coping: Goal: Psychosocial and spiritual needs will be supported Outcome: Progressing   Problem: Respiratory: Goal: Will maintain a patent airway Outcome: Progressing Goal: Complications related to the disease process, condition or treatment will be avoided or minimized Outcome: Progressing   Problem: Education: Goal: Knowledge of General Education information will improve Description: Including pain rating scale, medication(s)/side effects and non-pharmacologic comfort measures Outcome: Progressing   Problem: Health Behavior/Discharge Planning: Goal: Ability to manage health-related needs will improve Outcome: Progressing   Problem: Clinical Measurements: Goal: Ability to maintain clinical measurements within normal limits will improve Outcome: Progressing Goal: Will remain free from infection Outcome: Progressing Goal: Diagnostic test results will improve Outcome: Progressing Goal: Respiratory complications will improve Outcome: Progressing Goal: Cardiovascular complication will be avoided Outcome: Progressing   Problem: Activity: Goal: Risk for activity intolerance will decrease Outcome: Progressing   Problem: Nutrition: Goal: Adequate nutrition will be maintained Outcome: Progressing   Problem: Coping: Goal: Level of anxiety will decrease Outcome: Progressing   Problem: Elimination: Goal: Will not experience complications related to bowel motility Outcome: Progressing Goal: Will not experience complications related to urinary retention Outcome: Progressing   Problem: Pain Managment: Goal: General experience of comfort will improve and/or be controlled Outcome: Progressing   Problem: Safety: Goal: Ability to remain free from injury will improve Outcome: Progressing   Problem: Skin Integrity: Goal: Risk for impaired  skin integrity will decrease Outcome: Progressing   Problem: Education: Goal: Knowledge of disease or condition will improve Outcome: Progressing Goal: Knowledge of the prescribed therapeutic regimen will improve Outcome: Progressing Goal: Individualized Educational Video(s) Outcome: Progressing   Problem: Activity: Goal: Ability to tolerate increased activity will improve Outcome: Progressing Goal: Will verbalize the importance of balancing activity with adequate rest periods Outcome: Progressing   Problem: Respiratory: Goal: Ability to maintain a clear airway will improve Outcome: Progressing Goal: Levels of oxygenation will improve Outcome: Progressing Goal: Ability to maintain adequate ventilation will improve Outcome: Progressing

## 2024-08-06 NOTE — Progress Notes (Addendum)
 " PROGRESS NOTE    Leah [name]  FMW:[id number] DOB: [birth year] DOA: 08/04/2024 PCP: Antonio Cyndee [name], DO   Brief Narrative:  HPI: Leah [name] is a 79 y.o. female with medical history significant for COPD, HLD, HTN, CAD, tobacco use disorder, cervical spondylosis, cervical radiculopathy, GERD, Barrett's esophagus, anxiety and depression who presents to [facility] ED for evaluation of shortness of breath and burning discomfort. Patient reports she started having some fevers, cough and a headache last Monday so she presented to the urgent care on Wednesday and was diagnosed with COVID. She was prescribed a 5-day course of molnupiravir due to other drug interactions. Over the last few days, she has had some burning sensation in her neck and over shoulders arms and face bilaterally.  Reports she does have a history of burning sensation down her shoulders and arms from her chronic back pain but receives steroid injections intermittently. Patient continues to endorse cough and dyspnea on exertion but denies any fevers, chills, nausea, vomiting, chest pain or abdominal pain.  Reports she continues to smoke about 1 pack of cigarettes per day has been smoking since she was a teenager.   [facility] ED Course: Patient is hemodynamically stable. CBC unremarkable except low platelet 130. BMP showing elevated blood glucose 169 otherwise unremarkable.  Normal troponin. Respiratory panel positive with COVID (initially diagnosed 5 days ago). CXR no acute disease process. Moderate emphysema.   In the ED patient received Solu-Medrol , and DuoNeb belies her. Hospitalist consulted for further  management of COPD exacerbation.  Assessment & Plan:   Principal Problem:   COPD with acute exacerbation (HCC) Active Problems:   COVID-19   Acute hypoxic respiratory failure (HCC)   Hypokalemia   Cervical radiculopathy   Spondylosis of cervical spine   Anxiety and depression  # Acute hypoxic respiratory failure  secondary to acute COPD with acute exacerbation/recent COVID infection - Presented with persistent dyspnea on exertion and cough after recent COVID infection - CXR with no evidence of PNA; positive for COVID but close completed 5-day course of treatment.  Still does not feel well.  Has very minimal expiratory wheezes.  Continue steroids, azithromycin  as well as bronchodilators.  Supportive care for COVID-19.  # Hypokalemia Resolved.  Acute anemia: Some drop in hemoglobin, MCV upper normal.  Checking iron studies, B12 and folate.  Repeat CBC in the morning.  Thrombocytopenia, acute: Since yesterday but platelets stable.  No signs of bleeding.  Could be due to acute infectious etiology.  Repeat labs in the morning.   # HTN - BP stable, continue lisinopril    # HLD - Continue atorvastatin    # CAD - Continue Plavix  and atorvastatin    # Cervical spondylosis # Cervical radiculopathy - Reports chronic neck pain with associated burning sensation down bilateral shoulders and arms managed with intermittent steroid injections - Scheduled for repeat steroid injection on January 13th - IV steroids as above   # GERD - Continue famotidine    # Anxiety and depression - Continue sertraline  and Ativan    # Tobacco use disorder - Reports smoking 1 pack/day of cigarettes,  - I have discussed tobacco cessation with the patient.  I have counseled the patient regarding the negative impacts of continued tobacco use including but not limited to lung cancer, COPD, and cardiovascular disease.  I have discussed alternatives to tobacco and modalities that may help facilitate tobacco cessation including but not limited to biofeedback, hypnosis, and medications.  Total time spent with tobacco counseling was 5 minutes.  DVT prophylaxis: enoxaparin  (LOVENOX ) injection 30 mg Start: 08/05/24 2200   Code Status: Full Code  Family Communication:  None present at bedside.  Plan of care discussed with patient in  length and he/she verbalized understanding and agreed with it.  Status is: Inpatient Remains inpatient appropriate because: Still symptomatic   Estimated body mass index is 16.63 kg/m as calculated from the following:   Height as of this encounter: 5' 1 (1.549 m).   Weight as of this encounter: 39.9 kg.    Nutritional Assessment: Body mass index is 16.63 kg/m.[name] Seen by dietician.  I agree with the assessment and plan as outlined below: Nutrition Status:        . Skin Assessment: I have examined the patient's skin and I agree with the wound assessment as performed by the wound care RN as outlined below:    Consultants:  None  Procedures:  None  Antimicrobials:  Anti-infectives (From admission, onward)    Start     Dose/Rate Route Frequency Ordered Stop   08/06/24 1000  azithromycin  (ZITHROMAX ) tablet 500 mg        500 mg Oral Daily 08/05/24 2301 08/09/24 0959   08/05/24 0245  azithromycin  (ZITHROMAX ) tablet 500 mg  Status:  Discontinued        500 mg Oral Daily 08/05/24 0236 08/05/24 2301         Subjective: Patient seen and examined, she says that she feels very minimally improved compared to yesterday.  She does appear to be dyspneic while talking to me.  Objective: Vitals:   08/05/24 2152 08/05/24 2335 08/06/24 0513 08/06/24 0927  BP:  (!) 105/55 116/71   Pulse:  (!) 104    Resp:  18 18   Temp:  98.8 F (37.1 C) 98.6 F (37 C)   TempSrc:  Oral Oral   SpO2: 92%   98%  Weight:      Height:       No intake or output data in the 24 hours ending 08/06/24 1101 Filed Weights   08/04/24 2055  Weight: 39.9 kg    Examination:  General exam: Appears calm and comfortable  Respiratory system: Diminished breath sounds with end expiratory wheezes bilaterally. Cardiovascular system: S1 & S2 heard, RRR. No JVD, murmurs, rubs, gallops or clicks. No pedal edema. Gastrointestinal system: Abdomen is nondistended, soft and nontender. No organomegaly or masses  felt. Normal bowel sounds heard. Central nervous system: Alert and oriented. No focal neurological deficits. Extremities: Symmetric 5 x 5 power. Skin: No rashes, lesions or ulcers Psychiatry: Judgement and insight appear normal. Mood & affect appropriate.    Data Reviewed: I have personally reviewed following labs and imaging studies  CBC: Recent Labs  Lab 08/04/24 2058 08/05/24 0604 08/06/24 0556  WBC 5.9 4.7 8.1  HGB 14.0 12.7 11.5*  HCT 42.8 38.4 34.5*  MCV 92.0 91.9 93.2  PLT 130* 103* 131*   Basic Metabolic Panel: Recent Labs  Lab 08/04/24 2058 08/05/24 0604 08/06/24 0556  NA 139 140 140  K 4.0 3.1* 4.0  CL 100 101 104  CO2 26 28 29   GLUCOSE 169* 208* 103*  BUN 11 9 10   CREATININE 0.81 0.71 0.66  CALCIUM  8.8* 8.7* 8.7*   GFR: Estimated Creatinine Clearance: 37.7 mL/min (by C-G formula based on SCr of 0.66 mg/dL). Liver Function Tests: No results for input(s): AST, ALT, ALKPHOS, BILITOT, PROT, ALBUMIN in the last 168 hours. No results for input(s): LIPASE, AMYLASE in the last 168 hours. No  results for input(s): AMMONIA in the last 168 hours. Coagulation Profile: No results for input(s): INR, PROTIME in the last 168 hours. Cardiac Enzymes: No results for input(s): CKTOTAL, CKMB, CKMBINDEX, TROPONINI in the last 168 hours. BNP (last 3 results) No results for input(s): PROBNP in the last 8760 hours. HbA1C: No results for input(s): HGBA1C in the last 72 hours. CBG: No results for input(s): GLUCAP in the last 168 hours. Lipid Profile: No results for input(s): CHOL, HDL, LDLCALC, TRIG, CHOLHDL, LDLDIRECT in the last 72 hours. Thyroid  Function Tests: No results for input(s): TSH, T4TOTAL, FREET4, T3FREE, THYROIDAB in the last 72 hours. Anemia Panel: No results for input(s): VITAMINB12, FOLATE, FERRITIN, TIBC, IRON, RETICCTPCT in the last 72 hours. Sepsis Labs: No results for input(s):  PROCALCITON, LATICACIDVEN in the last 168 hours.  Recent Results (from the past 240 hours)  Resp panel by RT-PCR (RSV, Flu A&B, Covid) Anterior Nasal Swab     Status: Abnormal   Collection Time: 08/04/24 11:34 PM   Specimen: Anterior Nasal Swab  Result Value Ref Range Status   SARS Coronavirus 2 by RT PCR POSITIVE (A) NEGATIVE Final    Comment: (NOTE) SARS-CoV-2 target nucleic acids are DETECTED.  The SARS-CoV-2 RNA is generally detectable in upper respiratory specimens during the acute phase of infection. Positive results are indicative of the presence of the identified virus, but do not rule out bacterial infection or co-infection with other pathogens not detected by the test. Clinical correlation with patient history and other diagnostic information is necessary to determine patient infection status. The expected result is Negative.  Fact Sheet for Patients: bloggercourse.com  Fact Sheet for Healthcare Providers: seriousbroker.it  This test is not yet approved or cleared by the [place]  FDA and  has been authorized for detection and/or diagnosis of SARS-CoV-2 by FDA under an Emergency Use Authorization (EUA).  This EUA will remain in effect (meaning this test can be used) for the duration of  the COVID-19 declaration under Section 564(b)(1) of the A ct, 21 U.S.C. section 360bbb-3(b)(1), unless the authorization is terminated or revoked sooner.     Influenza A by PCR NEGATIVE NEGATIVE Final   Influenza B by PCR NEGATIVE NEGATIVE Final    Comment: (NOTE) The Xpert Xpress SARS-CoV-2/FLU/RSV plus assay is intended as an aid in the diagnosis of influenza from Nasopharyngeal swab specimens and should not be used as a sole basis for treatment. Nasal washings and aspirates are unacceptable for Xpert Xpress SARS-CoV-2/FLU/RSV testing.  Fact Sheet for Patients: bloggercourse.com  Fact Sheet for  Healthcare Providers: seriousbroker.it  This test is not yet approved or cleared by the [place]  FDA and has been authorized for detection and/or diagnosis of SARS-CoV-2 by FDA under an Emergency Use Authorization (EUA). This EUA will remain in effect (meaning this test can be used) for the duration of the COVID-19 declaration under Section 564(b)(1) of the Act, 21 U.S.C. section 360bbb-3(b)(1), unless the authorization is terminated or revoked.     Resp Syncytial Virus by PCR NEGATIVE NEGATIVE Final    Comment: (NOTE) Fact Sheet for Patients: bloggercourse.com  Fact Sheet for Healthcare Providers: seriousbroker.it  This test is not yet approved or cleared by the [place]  FDA and has been authorized for detection and/or diagnosis of SARS-CoV-2 by FDA under an Emergency Use Authorization (EUA). This EUA will remain in effect (meaning this test can be used) for the duration of the COVID-19 declaration under Section 564(b)(1) of the Act, 21 U.S.C. section 360bbb-3(b)(1), unless the authorization  is terminated or revoked.  Performed at [facility], [address], [place], [place] [address]      Radiology Studies: DG Chest 2 View Result Date: 08/04/2024 EXAM: 2 VIEW(S) XRAY OF THE CHEST 08/04/2024 09:13:00 PM COMPARISON: PA and lateral chest 09/24/2023. CLINICAL HISTORY: Shortness of breath, fever, and coughing with general malaise. Positive COVID-19 diagnosis. FINDINGS: LUNGS AND PLEURA: The lungs are moderately emphysematous. No focal pulmonary opacity. No pleural effusion. No pneumothorax. HEART AND MEDIASTINUM: The cardiac size is normal. There is patchy calcification of the aorta. The mediastinum is normally outlined. BONES AND SOFT TISSUES: Osteopenia and thoracic kyphosis. 3-level lower cervical ACDF plating hardware is again seen. IMPRESSION: 1. No acute radiographic findings. 2.  Moderate emphysema. Stable COPD chest . Electronically signed by: Francis Quam MD 08/04/2024 09:19 PM EST RP Workstation: [id number]    Scheduled Meds:  arformoterol   15 mcg Nebulization BID   atorvastatin   40 mg Oral Daily   azithromycin   500 mg Oral Daily   budesonide  (PULMICORT ) nebulizer solution  0.25 mg Nebulization BID   clopidogrel   75 mg Oral Daily   enoxaparin  (LOVENOX ) injection  30 mg Subcutaneous QHS   famotidine   20 mg Oral BID   ipratropium-albuterol   3 mL Nebulization TID   lisinopril   20 mg Oral Daily   LORazepam   0.5 mg Oral QHS   methylPREDNISolone  (SOLU-MEDROL ) injection  40 mg Intravenous Daily   nicotine   21 mg Transdermal Daily   sertraline   25 mg Oral QHS   Continuous Infusions:   LOS: 1 day   Fredia Skeeter, MD [facility]  08/06/2024, 11:01 AM   *Please note that this is a verbal dictation therefore any spelling or grammatical errors are due to the Dragon Medical One system interpretation.  Please page via Amion and do not message via secure chat for urgent patient care matters. Secure chat can be used for non urgent patient care matters.  How to contact the [facility] Attending or Consulting provider 7A - 7P or covering provider during after hours 7P -7A, for this patient?  Check the care team in [facility] and look for a) attending/consulting [facility] provider listed and b) the [facility] team listed. Page or secure chat 7A-7P. Log into [email or web address] and use 's universal password to access. If you do not have the password, please contact the hospital operator. Locate the [facility] provider you are looking for under [facility] and page to a number that you can be directly reached. If you still have difficulty reaching the provider, please page the [facility] (Director on Call) for the Hospitalists listed on amion for assistance.  "

## 2024-08-07 DIAGNOSIS — J441 Chronic obstructive pulmonary disease with (acute) exacerbation: Secondary | ICD-10-CM | POA: Diagnosis not present

## 2024-08-07 LAB — CBC WITH DIFFERENTIAL/PLATELET
Abs Immature Granulocytes: 0.03 K/uL (ref 0.00–0.07)
Basophils Absolute: 0 K/uL (ref 0.0–0.1)
Basophils Relative: 0 %
Eosinophils Absolute: 0 K/uL (ref 0.0–0.5)
Eosinophils Relative: 0 %
HCT: 37 % (ref 36.0–46.0)
Hemoglobin: 11.9 g/dL — ABNORMAL LOW (ref 12.0–15.0)
Immature Granulocytes: 0 %
Lymphocytes Relative: 17 %
Lymphs Abs: 1.5 K/uL (ref 0.7–4.0)
MCH: 30.5 pg (ref 26.0–34.0)
MCHC: 32.2 g/dL (ref 30.0–36.0)
MCV: 94.9 fL (ref 80.0–100.0)
Monocytes Absolute: 0.7 K/uL (ref 0.1–1.0)
Monocytes Relative: 8 %
Neutro Abs: 6.9 K/uL (ref 1.7–7.7)
Neutrophils Relative %: 75 %
Platelets: 179 K/uL (ref 150–400)
RBC: 3.9 MIL/uL (ref 3.87–5.11)
RDW: 12.3 % (ref 11.5–15.5)
WBC: 9.2 K/uL (ref 4.0–10.5)
nRBC: 0 % (ref 0.0–0.2)

## 2024-08-07 LAB — BASIC METABOLIC PANEL WITH GFR
Anion gap: 6 (ref 5–15)
BUN: 17 mg/dL (ref 8–23)
CO2: 31 mmol/L (ref 22–32)
Calcium: 8.8 mg/dL — ABNORMAL LOW (ref 8.9–10.3)
Chloride: 104 mmol/L (ref 98–111)
Creatinine, Ser: 0.69 mg/dL (ref 0.44–1.00)
GFR, Estimated: >60 mL/min
Glucose, Bld: 90 mg/dL (ref 70–99)
Potassium: 4.3 mmol/L (ref 3.5–5.1)
Sodium: 141 mmol/L (ref 135–145)

## 2024-08-07 MED ORDER — NYSTATIN 100000 UNIT/ML MT SUSP
5.0000 mL | Freq: Four times a day (QID) | OROMUCOSAL | Status: AC
  Administered 2024-08-07 – 2024-08-09 (×4): 500000 [IU] via OROMUCOSAL
  Filled 2024-08-07 (×7): qty 5

## 2024-08-07 NOTE — Plan of Care (Signed)

## 2024-08-07 NOTE — Progress Notes (Signed)
SATURATION QUALIFICATIONS: (This note is used to comply with regulatory documentation for home oxygen)  Patient Saturations on Room Air at Rest = 97%  Patient Saturations on Room Air while Ambulating = 91%  Patient Saturations on 2 Liters of oxygen while Ambulating = 98%  Please briefly explain why patient needs home oxygen: 

## 2024-08-07 NOTE — Progress Notes (Signed)
 " PROGRESS NOTE    Leah [name]  FMW:[id number] DOB: [birth year] DOA: 08/04/2024 PCP: Antonio Cyndee [name], DO   Brief Narrative:  Leah [name] is a 79 y.o. female with medical history significant for COPD, HLD, HTN, CAD, tobacco use disorder, cervical spondylosis, cervical radiculopathy, GERD, Barrett's esophagus, anxiety and depression who presents to [facility] ED for evaluation of shortness of breath and burning discomfort. Patient reports she started having some fevers, cough and a headache last Monday so she presented to the urgent care on Wednesday and was diagnosed with COVID. She was prescribed a 5-day course of molnupiravir due to other drug interactions. Over the last few days, she has had some burning sensation in her neck and over shoulders arms and face bilaterally.  Reports she does have a history of burning sensation down her shoulders and arms from her chronic back pain but receives steroid injections intermittently. Patient continues to endorse cough and dyspnea on exertion but denies any fevers, chills, nausea, vomiting, chest pain or abdominal pain.  Reports she continues to smoke about 1 pack of cigarettes per day has been smoking since she was a teenager.   [facility] ED Course: Patient is hemodynamically stable. CBC unremarkable except low platelet 130. BMP showing elevated blood glucose 169 otherwise unremarkable.  Normal troponin. Respiratory panel positive with COVID (initially diagnosed 5 days ago). CXR no acute disease process. Moderate emphysema.   In the ED patient received Solu-Medrol , and DuoNeb belies her. Hospitalist consulted for further  management of COPD exacerbation.  Assessment & Plan:   Principal Problem:   COPD with acute exacerbation (HCC) Active Problems:   COVID-19   Acute hypoxic respiratory failure (HCC)   Hypokalemia   Cervical radiculopathy   Spondylosis of cervical spine   Anxiety and depression  # Acute hypoxic respiratory failure  secondary to acute COPD with acute exacerbation/recent COVID infection - Presented with persistent dyspnea on exertion and cough after recent COVID infection - CXR with no evidence of PNA; positive for COVID but close completed 5-day course of treatment.  She says that she does not feel any better than yesterday however clinically she appears improved.  She is also weaned to room air as opposed to requiring oxygen yesterday.  Continue current treatment with steroids and bronchodilators as well as antibiotics.  Supportive care for COVID-19.  # Hypokalemia Resolved.  Acute anemia: Some drop in hemoglobin yesterday, MCV upper normal, iron studies, B12 as well as folate normal.  Hemoglobin is stable compared to yesterday.  Thrombocytopenia, acute: Resolved.  Most likely secondary to acute infection.   # HTN - BP stable, continue lisinopril    # HLD - Continue atorvastatin    # CAD - Continue Plavix  and atorvastatin    # Cervical spondylosis # Cervical radiculopathy - Reports chronic neck pain with associated burning sensation down bilateral shoulders and arms managed with intermittent steroid injections - Scheduled for repeat steroid injection on January 13th - IV steroids as above   # GERD - Continue famotidine    # Anxiety and depression - Continue sertraline  and Ativan    # Tobacco use disorder - Reports smoking 1 pack/day of cigarettes,  - I have discussed tobacco cessation with the patient.  I have counseled the patient regarding the negative impacts of continued tobacco use including but not limited to lung cancer, COPD, and cardiovascular disease.  I have discussed alternatives to tobacco and modalities that may help facilitate tobacco cessation including but not limited to biofeedback, hypnosis, and medications.  Total time spent with tobacco counseling was 5 minutes.   DVT prophylaxis: enoxaparin  (LOVENOX ) injection 30 mg Start: 08/05/24 2200   Code Status: Full Code  Family  Communication:  None present at bedside.  Plan of care discussed with patient in length and he/she verbalized understanding and agreed with it.  Status is: Inpatient Remains inpatient appropriate because: Still symptomatic   Estimated body mass index is 16.63 kg/m as calculated from the following:   Height as of this encounter: 5' 1 (1.549 m).   Weight as of this encounter: 39.9 kg.    Nutritional Assessment: Body mass index is 16.63 kg/m.Leah [name] Seen by dietician.  I agree with the assessment and plan as outlined below: Nutrition Status:        . Skin Assessment: I have examined the patient's skin and I agree with the wound assessment as performed by the wound care RN as outlined below:    Consultants:  None  Procedures:  None  Antimicrobials:  Anti-infectives (From admission, onward)    Start     Dose/Rate Route Frequency Ordered Stop   08/06/24 1000  azithromycin  (ZITHROMAX ) tablet 500 mg        500 mg Oral Daily 08/05/24 2301 08/09/24 0959   08/05/24 0245  azithromycin  (ZITHROMAX ) tablet 500 mg  Status:  Discontinued        500 mg Oral Daily 08/05/24 0236 08/05/24 2301         Subjective: Patient seen and examined.  She states that she is not feeling any better than yesterday.  She feels very nervous about going home as she lives alone and does not think she can take care of herself.  Visually she appears slightly dyspneic but much improved compared to yesterday.  Does not have wheezes today.  Objective: Vitals:   08/06/24 1441 08/06/24 1835 08/06/24 1956 08/07/24 0536  BP:  (!) 124/58 (!) 132/58 137/78  Pulse:  87 82 85  Resp:  15  20  Temp:  97.9 F (36.6 C)  98.7 F (37.1 C)  TempSrc:  Oral  Oral  SpO2: 92% 98%  100%  Weight:      Height:        Intake/Output Summary (Last 24 hours) at 08/07/2024 1032 Last data filed at 08/07/2024 0700 Gross per 24 hour  Intake 480 ml  Output --  Net 480 ml   Filed Weights   08/04/24 2055  Weight: 39.9 kg     Examination:  General exam: Appears slightly dyspneic, much improved compared to yesterday. Respiratory system: Distant, diminished breath sounds, no wheezes. Cardiovascular system: S1 & S2 heard, RRR. No JVD, murmurs, rubs, gallops or clicks. No pedal edema. Gastrointestinal system: Abdomen is nondistended, soft and nontender. No organomegaly or masses felt. Normal bowel sounds heard. Central nervous system: Alert and oriented. No focal neurological deficits. Extremities: Symmetric 5 x 5 power. Skin: No rashes, lesions or ulcers.  Psychiatry: Judgement and insight appear normal. Mood & affect appropriate.   Data Reviewed: I have personally reviewed following labs and imaging studies  CBC: Recent Labs  Lab 08/04/24 2058 08/05/24 0604 08/06/24 0556 08/07/24 0559  WBC 5.9 4.7 8.1 9.2  NEUTROABS  --   --   --  6.9  HGB 14.0 12.7 11.5* 11.9*  HCT 42.8 38.4 34.5* 37.0  MCV 92.0 91.9 93.2 94.9  PLT 130* 103* 131* 179   Basic Metabolic Panel: Recent Labs  Lab 08/04/24 2058 08/05/24 0604 08/06/24 0556 08/07/24 0559  NA 139 140  140 141  K 4.0 3.1* 4.0 4.3  CL 100 101 104 104  CO2 26 28 29 31   GLUCOSE 169* 208* 103* 90  BUN 11 9 10 17   CREATININE 0.81 0.71 0.66 0.69  CALCIUM  8.8* 8.7* 8.7* 8.8*   GFR: Estimated Creatinine Clearance: 37.7 mL/min (by C-G formula based on SCr of 0.69 mg/dL). Liver Function Tests: No results for input(s): AST, ALT, ALKPHOS, BILITOT, PROT, ALBUMIN in the last 168 hours. No results for input(s): LIPASE, AMYLASE in the last 168 hours. No results for input(s): AMMONIA in the last 168 hours. Coagulation Profile: No results for input(s): INR, PROTIME in the last 168 hours. Cardiac Enzymes: No results for input(s): CKTOTAL, CKMB, CKMBINDEX, TROPONINI in the last 168 hours. BNP (last 3 results) No results for input(s): PROBNP in the last 8760 hours. HbA1C: No results for input(s): HGBA1C in the last 72  hours. CBG: No results for input(s): GLUCAP in the last 168 hours. Lipid Profile: No results for input(s): CHOL, HDL, LDLCALC, TRIG, CHOLHDL, LDLDIRECT in the last 72 hours. Thyroid  Function Tests: No results for input(s): TSH, T4TOTAL, FREET4, T3FREE, THYROIDAB in the last 72 hours. Anemia Panel: Recent Labs    08/06/24 0556  VITAMINB12 866  FOLATE 11.7  FERRITIN 89  TIBC 249*  IRON 54  RETICCTPCT 0.9   Sepsis Labs: No results for input(s): PROCALCITON, LATICACIDVEN in the last 168 hours.  Recent Results (from the past 240 hours)  Resp panel by RT-PCR (RSV, Flu A&B, Covid) Anterior Nasal Swab     Status: Abnormal   Collection Time: 08/04/24 11:34 PM   Specimen: Anterior Nasal Swab  Result Value Ref Range Status   SARS Coronavirus 2 by RT PCR POSITIVE (A) NEGATIVE Final    Comment: (NOTE) SARS-CoV-2 target nucleic acids are DETECTED.  The SARS-CoV-2 RNA is generally detectable in upper respiratory specimens during the acute phase of infection. Positive results are indicative of the presence of the identified virus, but do not rule out bacterial infection or co-infection with other pathogens not detected by the test. Clinical correlation with patient history and other diagnostic information is necessary to determine patient infection status. The expected result is Negative.  Fact Sheet for Patients: bloggercourse.com  Fact Sheet for Healthcare Providers: seriousbroker.it  This test is not yet approved or cleared by the [place]  FDA and  has been authorized for detection and/or diagnosis of SARS-CoV-2 by FDA under an Emergency Use Authorization (EUA).  This EUA will remain in effect (meaning this test can be used) for the duration of  the COVID-19 declaration under Section 564(b)(1) of the A ct, 21 U.S.C. section 360bbb-3(b)(1), unless the authorization is terminated or revoked  sooner.     Influenza A by PCR NEGATIVE NEGATIVE Final   Influenza B by PCR NEGATIVE NEGATIVE Final    Comment: (NOTE) The Xpert Xpress SARS-CoV-2/FLU/RSV plus assay is intended as an aid in the diagnosis of influenza from Nasopharyngeal swab specimens and should not be used as a sole basis for treatment. Nasal washings and aspirates are unacceptable for Xpert Xpress SARS-CoV-2/FLU/RSV testing.  Fact Sheet for Patients: bloggercourse.com  Fact Sheet for Healthcare Providers: seriousbroker.it  This test is not yet approved or cleared by the [place]  FDA and has been authorized for detection and/or diagnosis of SARS-CoV-2 by FDA under an Emergency Use Authorization (EUA). This EUA will remain in effect (meaning this test can be used) for the duration of the COVID-19 declaration under Section 564(b)(1) of the Act, 21  U.S.C. section 360bbb-3(b)(1), unless the authorization is terminated or revoked.     Resp Syncytial Virus by PCR NEGATIVE NEGATIVE Final    Comment: (NOTE) Fact Sheet for Patients: bloggercourse.com  Fact Sheet for Healthcare Providers: seriousbroker.it  This test is not yet approved or cleared by the [place]  FDA and has been authorized for detection and/or diagnosis of SARS-CoV-2 by FDA under an Emergency Use Authorization (EUA). This EUA will remain in effect (meaning this test can be used) for the duration of the COVID-19 declaration under Section 564(b)(1) of the Act, 21 U.S.C. section 360bbb-3(b)(1), unless the authorization is terminated or revoked.  Performed at [facility], [address], [place], [place] [address]      Radiology Studies: No results found.   Scheduled Meds:  arformoterol   15 mcg Nebulization BID   atorvastatin   40 mg Oral Daily   azithromycin   500 mg Oral Daily   budesonide  (PULMICORT ) nebulizer solution   0.25 mg Nebulization BID   clopidogrel   75 mg Oral Daily   enoxaparin  (LOVENOX ) injection  30 mg Subcutaneous QHS   famotidine   20 mg Oral BID   ipratropium-albuterol   3 mL Nebulization TID   lisinopril   20 mg Oral Daily   LORazepam   0.5 mg Oral QHS   methylPREDNISolone  (SOLU-MEDROL ) injection  40 mg Intravenous Daily   nicotine   21 mg Transdermal Daily   sertraline   25 mg Oral QHS   Continuous Infusions:   LOS: 2 days   Fredia Skeeter, MD [facility]  08/07/2024, 10:32 AM   *Please note that this is a verbal dictation therefore any spelling or grammatical errors are due to the Dragon Medical One system interpretation.  Please page via Amion and do not message via secure chat for urgent patient care matters. Secure chat can be used for non urgent patient care matters.  How to contact the [facility] Attending or Consulting provider 7A - 7P or covering provider during after hours 7P -7A, for this patient?  Check the care team in [facility] and look for a) attending/consulting TR[facility] provider listed and b) the TR[facility] team listed. Page or secure chat 7A-7P. Log into [email or web address] and use [facility] universal password to access. If you do not have the password, please contact the hospital operator. Locate the [facility] provider you are looking for under [facility] and page to a number that you can be directly reached. If you still have difficulty reaching the provider, please page the [facility] (Director on Call) for the Hospitalists listed on amion for assistance.  "

## 2024-08-07 NOTE — TOC Initial Note (Signed)
 Transition of Care [facility]) - Initial/Assessment Note    Patient Details  Name: Leah [name] MRN: [id number] Date of Birth: Nov 03, 1945  [facility] CM/SW Contact:    Heather DELENA Saltness, LCSW Phone Number: 08/07/2024, 9:46 AM  Clinical Narrative:                 CSW spoke with pt to discuss discharge planning and PT's recommendation for [facility] PT/OT services upon discharge. Pt reports she lives alone in single family home and her husband passed away about one year ago. Pt reports wanting to see how I do in the hospital before doing home health. TOC will continue to follow and assist with discharge needs.    Expected Discharge Plan: Home w Home Health Services Barriers to Discharge: Continued Medical Work up   Patient Goals and CMS Choice Patient states their goals for this hospitalization and ongoing recovery are:: To return home   Choice offered to / list presented to : NA [place] ownership interest in [facility].provided to:: Parent NA    Expected Discharge Plan and Services In-house Referral: Clinical Social Work Discharge Planning Services: NA Post Acute Care Choice: Home Health Living arrangements for the past 2 months: Single Family Home                 DME Arranged: N/A DME Agency: NA       HH Arranged: NA HH Agency: NA        Prior Living Arrangements/Services Living arrangements for the past 2 months: Single Family Home Lives with:: Self Patient language and need for interpreter reviewed:: Yes Do you feel safe going back to the place where you live?: Yes      Need for Family Participation in Patient Care: Yes (Comment) Care giver support system in place?: No (comment)   Criminal Activity/Legal Involvement Pertinent to Current Situation/Hospitalization: No - Comment as needed  Activities of Daily Living   ADL Screening (condition at time of admission) Independently performs ADLs?: Yes (appropriate for developmental age) Is the  patient deaf or have difficulty hearing?: Yes Does the patient have difficulty seeing, even when wearing glasses/contacts?: No Does the patient have difficulty concentrating, remembering, or making decisions?: No  Permission Sought/Granted Permission sought to share information with : Case Manager, Family Supports Permission granted to share information with : Yes, Verbal Permission Granted  Share Information with NAME: Lori Holten  Permission granted to share info w AGENCY: HH agencies  Permission granted to share info w Relationship: Daughter  Permission granted to share info w Contact Information: [phone]  Emotional Assessment Appearance:: Appears stated age Attitude/Demeanor/Rapport: Engaged Affect (typically observed): Stable, Accepting Orientation: : Oriented to Self, Oriented to Place, Oriented to  Time, Oriented to Situation Alcohol / Substance Use: Not Applicable Psych Involvement: No (comment)  Admission diagnosis:  COPD exacerbation (HCC) [J44.1] COPD with acute exacerbation (HCC) [J44.1] COVID-19 [U07.1] Patient Active Problem List   Diagnosis Date Noted   COPD with acute exacerbation (HCC) 08/05/2024   COVID-19 08/05/2024   Acute hypoxic respiratory failure (HCC) 08/05/2024   Hypokalemia 08/05/2024   Cervical radiculopathy 08/05/2024   Spondylosis of cervical spine 08/05/2024   Anxiety and depression 08/05/2024   Moderate protein-calorie malnutrition 10/01/2023   Vitamin D  deficiency 09/03/2023   Weight loss, abnormal 03/24/2022   Acute cough 06/28/2021   Acute pain of right shoulder 09/07/2020   Cold intolerance 09/07/2020   SBO (small bowel obstruction) (HCC) 09/19/2019   Current every day smoker  09/19/2019   Diverticulitis 09/19/2019   Partial small bowel obstruction (HCC) 09/04/2019   Preventative health care 08/27/2019   Family history of coronary artery disease 02/17/2019   Claudication in peripheral vascular disease 10/14/2018   Dyspnea  09/03/2018   Calcification of native coronary artery 09/03/2018   PAD (peripheral artery disease) 09/03/2018   History of esophageal stricture 06/19/2018   Rhinitis, chronic 04/23/2017   Small bowel obstruction due to adhesions (HCC) 04/08/2016   Frozen shoulder 04/27/2015   Hematoma 09/07/2014   Cervical spondylosis with myelopathy 08/18/2014   Cervical spondylosis with radiculopathy 08/18/2014   Neck pain 05/19/2014   Fatigue 05/08/2014   Back pain 05/08/2014   Pain in joint, upper arm 05/08/2014   LLQ abdominal pain 03/02/2014   History of diverticulitis 03/02/2014   COPD exacerbation (HCC) 01/28/2014   Oral thrush 01/28/2014   Influenza 10/27/2013   Appendicitis 09/19/2013   Abdominal pain, left lower quadrant 03/05/2013   Essential hypertension 02/07/2013   Sinusitis, acute 01/15/2013   COPD GOLD II 01/15/2013   Smoker 12/20/2012   Epigastric pain 09/03/2012   Left lower quadrant pain 01/24/2011   Leg pain, left 06/07/2009   ROSACEA 04/16/2009   B12 DEFICIENCY 04/09/2009   ANEMIA, IRON DEFICIENCY 03/19/2009   MALAISE AND FATIGUE 03/19/2009   NECK PAIN, LEFT 01/11/2009   URI 07/30/2008   Constipation 05/26/2008   NAUSEA AND VOMITING 05/26/2008   Abdominal pain 05/26/2008   Claudication (HCC) 04/27/2008   VOMITING 04/17/2008   Dysphagia 04/17/2008   ANXIETY DEPRESSION 03/12/2008   TREMOR 03/12/2008   Hyperlipidemia 11/13/2007   DEPRESSION 11/13/2007   ESOPHAGEAL SPASM 11/13/2007   GERD (gastroesophageal reflux disease) 11/13/2007   IBS 11/13/2007   CARPAL TUNNEL SYNDROME, HX OF 11/13/2007   Diverticulosis of colon (without mention of hemorrhage) 03/14/2007   GASTRITIS, CHRONIC 04/10/2002   HIATAL HERNIA 04/10/2002   PCP:  Antonio Cyndee [name], DO Pharmacy:   [facility] [phone] - [place], [place] - [address] [facility] [place] [address] Phone: [phone] Fax: [phone]    Social Drivers of Health (SDOH) Social History: SDOH  Screenings   Food Insecurity: No Food Insecurity (08/05/2024)  Housing: Low Risk (08/05/2024)  Transportation Needs: No Transportation Needs (08/05/2024)  Utilities: Not At Risk (08/05/2024)  Depression (PHQ2-9): Low Risk (03/06/2023)  Social Connections: Socially Isolated (08/05/2024)  Tobacco Use: High Risk (08/04/2024)   SDOH Interventions: None     Readmission Risk Interventions    08/07/2024    9:44 AM  Readmission Risk Prevention Plan  Transportation Screening Complete  PCP or Specialist Appt within 5-7 Days Complete  Home Care Screening Complete  Medication Review (RN CM) Complete   Signed: Heather Saltness, MSW, LCSW Clinical Social Worker Inpatient Care Management 08/07/2024 9:49 AM

## 2024-08-08 NOTE — Progress Notes (Signed)
 " PROGRESS NOTE    Leah [name]  FMW:[id number] DOB: 19-Jun-1946 DOA: 08/04/2024 PCP: Antonio Cyndee [name], DO    Brief Narrative:  Patient is 79 year old with history of COPD but not on home oxygen, hypertension, hyperlipidemia, ongoing smoker, cervical radiculopathy, anxiety and depression presented to the hospital with about 1 week of fever cough shortness of breath and headache.  She was diagnosed with COVID-19 on 12/31st with couple days of symptoms.  In the emergency room she was hemodynamically stable.  COVID was positive.  Initially diagnosed 5 days ago.  Chest x-ray normal.  Had significant wheezing and shortness of breath so she was admitted to the hospital.  Remains in the hospital with significant symptoms.  Subjective: Patient seen and examined.  She was wheezy.  Very nervous.  On minimal oxygen. Assessment & Plan:   COPD with acute exacerbation secondary to COVID-19 infection: Agree with ongoing monitoring in the hospital due to persistent symptoms.   Continue aggressive bronchodilator therapy, IV steroids, inhalational steroids. deep breathing exercises, incentive spirometry, chest physiotherapy PT OT. Antibiotics due to severity of symptoms. Supplemental oxygen to keep saturations more than 90%. COVID-19 was diagnosed more than 10 days ago, supportive care.  Isolation while in the hospital.  Smoker: Counseled to quit.  Currently on nicotine  patch.  Chronic medical issues including Hypertension, stable on lisinopril  Hyperlipidemia, on atorvastatin  Coronary artery disease, stable on Plavix  and Lipitor . Cervical radiculopathy, chronic pain.  Symptomatic management. GERD, on Pepcid .    DVT prophylaxis: enoxaparin  (LOVENOX ) injection 30 mg Start: 08/05/24 2200   Code Status: Full code Family Communication: No family at the bedside Disposition Plan: Status is: Inpatient Remains inpatient appropriate because: Symptomatic, has persistent wheezing.  Can transition to  MedSurg bed.     Consultants:  None  Procedures:  None  Antimicrobials:  None     Objective: Vitals:   08/07/24 1438 08/07/24 2246 08/08/24 0458 08/08/24 0858  BP:  138/68 (!) 142/73   Pulse:  (!) 106 91   Resp:  20 20   Temp:  99.3 F (37.4 C) 98.6 F (37 C)   TempSrc:  Oral Oral   SpO2: 99%   98%  Weight:      Height:        Intake/Output Summary (Last 24 hours) at 08/08/2024 1137 Last data filed at 08/08/2024 0900 Gross per 24 hour  Intake 240 ml  Output --  Net 240 ml   Filed Weights   08/04/24 2055  Weight: 39.9 kg    Examination:  General exam: Appears slightly anxious.  Visibly dyspneic on talking. Respiratory system: Poor bilateral air entry.  Expiratory wheezes present. Cardiovascular system: S1 & S2 heard, RRR. No pedal edema. Gastrointestinal system: Abdomen is nondistended, soft and nontender. No organomegaly or masses felt. Normal bowel sounds heard. Central nervous system: Alert and oriented. No focal neurological deficits. Extremities: Symmetric 5 x 5 power. Skin: No rashes, lesions or ulcers   Data Reviewed: I have personally reviewed following labs and imaging studies  CBC: Recent Labs  Lab 08/04/24 2058 08/05/24 0604 08/06/24 0556 08/07/24 0559  WBC 5.9 4.7 8.1 9.2  NEUTROABS  --   --   --  6.9  HGB 14.0 12.7 11.5* 11.9*  HCT 42.8 38.4 34.5* 37.0  MCV 92.0 91.9 93.2 94.9  PLT 130* 103* 131* 179   Basic Metabolic Panel: Recent Labs  Lab 08/04/24 2058 08/05/24 0604 08/06/24 0556 08/07/24 0559  NA 139 140 140 141  K 4.0 3.1*  4.0 4.3  CL 100 101 104 104  CO2 26 28 29 31   GLUCOSE 169* 208* 103* 90  BUN 11 9 10 17   CREATININE 0.81 0.71 0.66 0.69  CALCIUM  8.8* 8.7* 8.7* 8.8*   GFR: Estimated Creatinine Clearance: 37.7 mL/min (by C-G formula based on SCr of 0.69 mg/dL). Liver Function Tests: No results for input(s): AST, ALT, ALKPHOS, BILITOT, PROT, ALBUMIN in the last 168 hours. No results for input(s):  LIPASE, AMYLASE in the last 168 hours. No results for input(s): AMMONIA in the last 168 hours. Coagulation Profile: No results for input(s): INR, PROTIME in the last 168 hours. Cardiac Enzymes: No results for input(s): CKTOTAL, CKMB, CKMBINDEX, TROPONINI in the last 168 hours. BNP (last 3 results) No results for input(s): PROBNP in the last 8760 hours. HbA1C: No results for input(s): HGBA1C in the last 72 hours. CBG: No results for input(s): GLUCAP in the last 168 hours. Lipid Profile: No results for input(s): CHOL, HDL, LDLCALC, TRIG, CHOLHDL, LDLDIRECT in the last 72 hours. Thyroid  Function Tests: No results for input(s): TSH, T4TOTAL, FREET4, T3FREE, THYROIDAB in the last 72 hours. Anemia Panel: Recent Labs    08/06/24 0556  VITAMINB12 866  FOLATE 11.7  FERRITIN 89  TIBC 249*  IRON 54  RETICCTPCT 0.9   Sepsis Labs: No results for input(s): PROCALCITON, LATICACIDVEN in the last 168 hours.  Recent Results (from the past 240 hours)  Resp panel by RT-PCR (RSV, Flu A&B, Covid) Anterior Nasal Swab     Status: Abnormal   Collection Time: 08/04/24 11:34 PM   Specimen: Anterior Nasal Swab  Result Value Ref Range Status   SARS Coronavirus 2 by RT PCR POSITIVE (A) NEGATIVE Final    Comment: (NOTE) SARS-CoV-2 target nucleic acids are DETECTED.  The SARS-CoV-2 RNA is generally detectable in upper respiratory specimens during the acute phase of infection. Positive results are indicative of the presence of the identified virus, but do not rule out bacterial infection or co-infection with other pathogens not detected by the test. Clinical correlation with patient history and other diagnostic information is necessary to determine patient infection status. The expected result is Negative.  Fact Sheet for Patients: bloggercourse.com  Fact Sheet for Healthcare  Providers: seriousbroker.it  This test is not yet approved or cleared by the [place]  FDA and  has been authorized for detection and/or diagnosis of SARS-CoV-2 by FDA under an Emergency Use Authorization (EUA).  This EUA will remain in effect (meaning this test can be used) for the duration of  the COVID-19 declaration under Section 564(b)(1) of the A ct, 21 U.S.C. section 360bbb-3(b)(1), unless the authorization is terminated or revoked sooner.     Influenza A by PCR NEGATIVE NEGATIVE Final   Influenza B by PCR NEGATIVE NEGATIVE Final    Comment: (NOTE) The Xpert Xpress SARS-CoV-2/FLU/RSV plus assay is intended as an aid in the diagnosis of influenza from Nasopharyngeal swab specimens and should not be used as a sole basis for treatment. Nasal washings and aspirates are unacceptable for Xpert Xpress SARS-CoV-2/FLU/RSV testing.  Fact Sheet for Patients: bloggercourse.com  Fact Sheet for Healthcare Providers: seriousbroker.it  This test is not yet approved or cleared by the [place]  FDA and has been authorized for detection and/or diagnosis of SARS-CoV-2 by FDA under an Emergency Use Authorization (EUA). This EUA will remain in effect (meaning this test can be used) for the duration of the COVID-19 declaration under Section 564(b)(1) of the Act, 21 U.S.C. section 360bbb-3(b)(1), unless the authorization  is terminated or revoked.     Resp Syncytial Virus by PCR NEGATIVE NEGATIVE Final    Comment: (NOTE) Fact Sheet for Patients: bloggercourse.com  Fact Sheet for Healthcare Providers: seriousbroker.it  This test is not yet approved or cleared by the [place]  FDA and has been authorized for detection and/or diagnosis of SARS-CoV-2 by FDA under an Emergency Use Authorization (EUA). This EUA will remain in effect (meaning this test can be  used) for the duration of the COVID-19 declaration under Section 564(b)(1) of the Act, 21 U.S.C. section 360bbb-3(b)(1), unless the authorization is terminated or revoked.  Performed at [facility], [address] [place], [place] [address]          Radiology Studies: No results found.      Scheduled Meds:  arformoterol   15 mcg Nebulization BID   atorvastatin   40 mg Oral Daily   budesonide  (PULMICORT ) nebulizer solution  0.25 mg Nebulization BID   clopidogrel   75 mg Oral Daily   enoxaparin  (LOVENOX ) injection  30 mg Subcutaneous QHS   famotidine   20 mg Oral BID   ipratropium-albuterol   3 mL Nebulization TID   lisinopril   20 mg Oral Daily   LORazepam   0.5 mg Oral QHS   nicotine   21 mg Transdermal Daily   nystatin   5 mL Mouth/Throat QID   sertraline   25 mg Oral QHS   Continuous Infusions:   LOS: 3 days    Time spent: 45 minutes    Renato Applebaum, MD [facility]   "

## 2024-08-08 NOTE — Progress Notes (Signed)
 Physical Therapy Treatment Patient Details Name: Leah [name] MRN: [id number] DOB: 1945-08-26 Today's Date: 08/08/2024   History of Present Illness 79 yo female admitted with COPD exac, COVID+. Hx of COPD, essential HTN, CAD    PT Comments   AxO x 3 pleasant Lady who lives home alone and was IND/DRV prior to admit.  Feeling very weak and easily tired.  SATURATION QUALIFICATIONS: (This note is used to comply with regulatory documentation for home oxygen)  Patient Saturations on Room Air at Rest = 94%  Patient Saturations on Room Air while Ambulating 28 feet = 88%  Patient Saturations on 2 Liters of oxygen while Ambulating = 90%  Please briefly explain why patient needs home oxygen: Pt required supplemental oxygen to achieve therapeutic levels.    Pt self able to get in/OOB to close by [facility].  General Gait Details: assisted with amb around room NO AD as prior a limited distance 28 feet.  Max c/o dyspnea after.  RA decreased from 94% to 88% with activity and HR increased from 84 to 128.  Dyspnea 3/4.  Max c/o weakness/fatigue.  Educated on COVID effects of such.  Educated on pacing her activities once home as well as some energy conservation tech.  Pt asking, How will I be able to go to the groccery staore? Very limited activity tolerance.  Educated on slow recovery and need for some assistance from family once home.  Also, VERY unsteady gait with several lateral LOB. Pt plans to D/C to home.  LPT has rec HH PT.  Pt declines need for a walker.      If plan is discharge home, recommend the following: A little help with walking and/or transfers;A little help with bathing/dressing/bathroom;Assistance with cooking/housework;Assist for transportation;Help with stairs or ramp for entrance   Can travel by private vehicle        Equipment Recommendations  None recommended by PT    Recommendations for Other Services       Precautions / Restrictions Precautions Precautions:  Fall Precaution/Restrictions Comments: monitor O2     Mobility  Bed Mobility Overal bed mobility: Modified Independent             General bed mobility comments: self able to get in/OOB    Transfers Overall transfer level: Modified independent Equipment used: None   Sit to Stand: Modified independent (Device/Increase time)           General transfer comment: self able to transfer to and from near by [facility] NO AD but is using B UE's to steady self.    Ambulation/Gait Ambulation/Gait assistance: Min assist Gait Distance (Feet): 28 Feet Assistive device: 1 person hand held assist Gait Pattern/deviations: Step-through pattern, Decreased stride length Gait velocity: decreased     General Gait Details: assisted with amb around room NO AD as prior a limited distance 28 feet.  Max c/o dyspnea after.  RA decreased from 94% to 88% with activity and HR increased from 84 to 128.  Dyspnea 3/4.  Max c/o weakness/fatigue.  Educated on COVID effects of such.  Educated on pacing her activities once home as well as some energy conservation tech.  Pt asking, How will I be able to go to the groccery staore? Very limited activity tolerance.  Educated on slow recovery and need for some assistance from family once home.  Also, VERY unsteady gait with several lateral LOB.   Stairs             Psychologist, [employer] [facility]  Tilt Bed    Modified Rankin (Stroke Patients Only)       Balance                                            Communication Communication Communication: No apparent difficulties  Cognition Arousal: Alert Behavior During Therapy: WFL for tasks assessed/performed   PT - Cognitive impairments: No apparent impairments                       PT - Cognition Comments: AxO x 3 pleasant Lady who lives home alone and was IND/DRV prior to admit.  Feeling very weak and easily tired. Following commands: Intact      Cueing Cueing  Techniques: Verbal cues  Exercises      General Comments        Pertinent Vitals/Pain Pain Assessment Pain Assessment: No/denies pain    Home Living                          Prior Function            PT Goals (current goals can now be found in the care plan section) Progress towards PT goals: Progressing toward goals    Frequency    Min 3X/week      PT Plan      Co-evaluation              AM-PAC PT 6 Clicks Mobility   Outcome Measure  Help needed turning from your back to your side while in a flat bed without using bedrails?: None Help needed moving from lying on your back to sitting on the side of a flat bed without using bedrails?: None Help needed moving to and from a bed to a chair (including a wheelchair)?: None Help needed standing up from a chair using your arms (e.g., wheelchair or bedside chair)?: None Help needed to walk in hospital room?: A Little Help needed climbing 3-5 steps with a railing? : A Little 6 Click Score: 22    End of Session Equipment Utilized During Treatment: Gait belt;Oxygen Activity Tolerance: Patient tolerated treatment well Patient left: in bed;with call bell/phone within reach Nurse Communication: Mobility status PT Visit Diagnosis: Difficulty in walking, not elsewhere classified (R26.2);Unsteadiness on feet (R26.81)     Time: 8847-8779 PT Time Calculation (min) (ACUTE ONLY): 28 min  Charges:    $Gait Training: 8-22 mins $Therapeutic Activity: 8-22 mins PT General Charges $$ ACUTE PT VISIT: 1 Visit                     Katheryn Leap  PTA Acute  Rehabilitation Services Office M-F          [phone]

## 2024-08-08 NOTE — Plan of Care (Signed)
  Problem: Clinical Measurements: Goal: Cardiovascular complication will be avoided Outcome: Progressing   Problem: Activity: Goal: Risk for activity intolerance will decrease Outcome: Progressing   Problem: Coping: Goal: Level of anxiety will decrease Outcome: Progressing   Problem: Pain Managment: Goal: General experience of comfort will improve and/or be controlled Outcome: Progressing   Problem: Safety: Goal: Ability to remain free from injury will improve Outcome: Progressing   Problem: Skin Integrity: Goal: Risk for impaired skin integrity will decrease Outcome: Progressing

## 2024-08-08 NOTE — Progress Notes (Signed)
" °   08/08/24 1534  Assess: MEWS Score  BP (!) 154/87  MAP (mmHg) 105  Pulse Rate (!) 134  SpO2 96 %  Assess: MEWS Score  MEWS Temp 0  MEWS Systolic 0  MEWS Pulse 3  MEWS RR 1  MEWS LOC 0  MEWS Score 4  MEWS Score Color Red  Assess: if the MEWS score is Yellow or Red  Were vital signs accurate and taken at a resting state? No, vital signs rechecked  Does the patient meet 2 or more of the SIRS criteria? Yes  Does the patient have a confirmed or suspected source of infection? No  MEWS guidelines implemented  Yes, yellow  Treat  MEWS Interventions Considered administering scheduled or prn medications/treatments as ordered  Take Vital Signs  Increase Vital Sign Frequency  Yellow: Q2hr x1, continue Q4hrs until patient remains green for 12hrs  Escalate  MEWS: Escalate Yellow: Discuss with charge nurse and consider notifying provider and/or RRT  Notify: Charge Nurse/RN  Name of Charge Nurse/RN Notified chiquita GRADE RN  Provider Notification  Provider Name/Title Ghimire MD  Date Provider Notified 08/08/24  Time Provider Notified 1545  Method of Notification Page  Notification Reason Other (Comment)  Provider response No new orders  Date of Provider Response 08/08/24  Time of Provider Response 1547  Assess: SIRS CRITERIA  SIRS Temperature  0  SIRS Respirations  1  SIRS Pulse 1  SIRS WBC 0  SIRS Score Sum  2    "

## 2024-08-08 NOTE — Plan of Care (Signed)

## 2024-08-08 NOTE — Progress Notes (Signed)
 Occupational Therapy Treatment Patient Details Name: Leah [name] MRN: [id number] DOB: 1945-08-23 Today's Date: 08/08/2024   History of present illness 79 yr old female admitted with COPD exacerbation and COVID-19. PMH: COPD, essential HTN, CAD   OT comments  The pt continues to report feelings of generalized weakness. As such, OT instructed her on upper body therapeutic exercises for strengthening needed to facilitate progressive ADL performance. While seated EOB, she required SBA to perform multiple reps and 1 set of exercises such as bicep curls, lateral exchanges, and vertical pulls. She required 2 therapeutic rest breaks, due to feelings of shortness of breath and fatigue. OT further educated her on implementing pursed lip breathing exercises as needed. Continue OT plan of care. Home health OT is recommended.       If plan is discharge home, recommend the following:  Assistance with cooking/housework;Help with stairs or ramp for entrance   Equipment Recommendations  None recommended by OT    Recommendations for Other Services      Precautions / Restrictions Precautions Precaution/Restrictions Comments: monitor O2 Restrictions Weight Bearing Restrictions Per Provider Order: No       Mobility Bed Mobility   Bed Mobility: Supine to Sit     Supine to sit: Supervision, HOB elevated, Used rails                 ADL either performed or assessed with clinical judgement   ADL Overall ADL's : Needs assistance/impaired         Communication Communication Communication: No apparent difficulties   Cognition Arousal: Alert Behavior During Therapy: WFL for tasks assessed/performed Cognition: No apparent impairments        Following commands: Intact        Cueing   Cueing Techniques: Verbal cues             Pertinent Vitals/ Pain       Pain Assessment Pain Assessment: No/denies pain   Frequency  Min 2X/week        Progress Toward Goals  OT  Goals(current goals can now be found in the care plan section)  Progress towards OT goals: Progressing toward goals  Acute Rehab OT Goals Patient Stated Goal: to get stronger and to return to prior level of functioning OT Goal Formulation: With patient Time For Goal Achievement: 08/20/24 Potential to Achieve Goals: Good  Plan         AM-PAC OT 6 Clicks Daily Activity     Outcome Measure   Help from another person eating meals?: None Help from another person taking care of personal grooming?: A Little Help from another person toileting, which includes using toliet, bedpan, or urinal?: A Little Help from another person bathing (including washing, rinsing, drying)?: A Little Help from another person to put on and taking off regular upper body clothing?: None Help from another person to put on and taking off regular lower body clothing?: A Little 6 Click Score: 20    End of Session Equipment Utilized During Treatment: Oxygen  OT Visit Diagnosis: Muscle weakness (generalized) (M62.81)   Activity Tolerance Patient tolerated treatment well   Patient Left in bed;with call bell/phone within reach   Nurse Communication Mobility status        Time: 1535-1550 OT Time Calculation (min): 15 min  Charges: OT General Charges $OT Visit: 1 Visit OT Treatments $Therapeutic Activity: 8-22 mins    Delanna JINNY Lesches, OTR/L 08/08/2024, 4:06 PM

## 2024-08-09 MED ORDER — METHYLPREDNISOLONE SODIUM SUCC 40 MG IJ SOLR
40.0000 mg | Freq: Every day | INTRAMUSCULAR | Status: DC
  Administered 2024-08-09 – 2024-08-11 (×3): 40 mg via INTRAVENOUS
  Filled 2024-08-09 (×3): qty 1

## 2024-08-09 NOTE — Plan of Care (Signed)
  Problem: Education: Goal: Knowledge of General Education information will improve Description: Including pain rating scale, medication(s)/side effects and non-pharmacologic comfort measures Outcome: Progressing   Problem: Clinical Measurements: Goal: Ability to maintain clinical measurements within normal limits will improve Outcome: Progressing Goal: Will remain free from infection Outcome: Progressing Goal: Respiratory complications will improve Outcome: Progressing Goal: Cardiovascular complication will be avoided Outcome: Progressing   Problem: Activity: Goal: Risk for activity intolerance will decrease Outcome: Progressing   Problem: Nutrition: Goal: Adequate nutrition will be maintained Outcome: Progressing   Problem: Coping: Goal: Level of anxiety will decrease Outcome: Progressing   Problem: Elimination: Goal: Will not experience complications related to bowel motility Outcome: Progressing Goal: Will not experience complications related to urinary retention Outcome: Progressing   Problem: Pain Managment: Goal: General experience of comfort will improve and/or be controlled Outcome: Progressing   Problem: Safety: Goal: Ability to remain free from injury will improve Outcome: Progressing

## 2024-08-09 NOTE — Plan of Care (Signed)

## 2024-08-09 NOTE — Progress Notes (Signed)
 " PROGRESS NOTE  Leah [name] FMW:[id number] DOB: 02/13/1946 DOA: 08/04/2024 PCP: Antonio Cyndee [name], DO   LOS: 4 days   Brief Narrative / Interim history: 79 year old female with history of COPD but not on home oxygen, ongoing tobacco use but has not smoked in the past 3 weeks, HTN, HLD, anxiety/depression comes into the hospital with a week of cough, shortness of breath, headaches.  She was apparently diagnosed with COVID-19 on July 30, 2024, treated conservatively.  With increased wheezing and shortness of breath decided to come to the ER and was admitted to the hospital on 1/5  Subjective / 24h Interval events: She tells me she had a rough night, had chills and could not sleep well.  She appears anxious.  States that breathing is about the same  Assesement and Plan: Principal problem COPD exacerbation secondary COVID-19 infection -patient tested positive for COVID originally about 11 days ago, here in the hospital when she presented tested positive again on 1/5 - Continue supportive care with nebulizers, steroids, wheezing seems to be almost entirely resolved - Wean off to room air as tolerated, on 2 L this morning - Encouraged ambulation  Active problems Tobacco use-counseled for cessation  Essential hypertension-continue lisinopril , blood pressure is stable  CAD-no chest pain, continue Plavix , atorvastatin   Hyperlipidemia-continue statin  Depression-continue sertraline   Scheduled Meds:  arformoterol   15 mcg Nebulization BID   atorvastatin   40 mg Oral Daily   budesonide  (PULMICORT ) nebulizer solution  0.25 mg Nebulization BID   clopidogrel   75 mg Oral Daily   enoxaparin  (LOVENOX ) injection  30 mg Subcutaneous QHS   famotidine   20 mg Oral BID   lisinopril   20 mg Oral Daily   LORazepam   0.5 mg Oral QHS   methylPREDNISolone  (SOLU-MEDROL ) injection  40 mg Intravenous Daily   nicotine   21 mg Transdermal Daily   nystatin   5 mL Mouth/Throat QID   sertraline   25 mg  Oral QHS   Continuous Infusions: PRN Meds:.acetaminophen  **OR** acetaminophen , bisacodyl , guaiFENesin , ipratropium-albuterol , menthol , ondansetron  **OR** ondansetron  (ZOFRAN ) IV, senna-docusate  Current Outpatient Medications  Medication Instructions   albuterol  (VENTOLIN  HFA) 108 (90 Base) MCG/ACT inhaler TAKE 2 PUFFS BY MOUTH EVERY 6 HOURS AS NEEDED   albuterol  (VENTOLIN  HFA) 108 (90 Base) MCG/ACT inhaler 2 puffs, Inhalation, Every 4 hours PRN   atorvastatin  (LIPITOR ) 40 mg, Oral, Daily   clopidogrel  (PLAVIX ) 75 mg, Oral, Daily   dicyclomine  (BENTYL ) 10 MG capsule TAKE 1 CAPSULE BY MOUTH AS NEEDED FOR UP TO 1 DOSE FOR SPASMS.   estradiol  (CLIMARA  - DOSED IN MG/24 HR) 0.1 mg, Every 14 days   Evolocumab  (REPATHA  SURECLICK) 140 MG/ML SOAJ INJECT 1 DOSE INTO THE SKIN EVERY 14 (FOURTEEN) DAYS.   ezetimibe  (ZETIA ) 10 mg, Oral, Daily   famotidine  (PEPCID ) 20 mg, Oral, 2 times daily   fluticasone  (FLONASE ) 50 MCG/ACT nasal spray 2 sprays, Each Nare, Daily   LAGEVRIO 200 MG CAPS capsule 4 capsules, Oral, Every 12 hours   linaclotide  (LINZESS ) 72 mcg, Oral, Daily before breakfast   lisinopril  (ZESTRIL ) 20 mg, Oral, Daily   loratadine  (CLARITIN ) 10 mg, Oral, Daily   LORazepam  (ATIVAN ) 0.5 mg, Daily at bedtime   ondansetron  (ZOFRAN -ODT) 4 mg, Oral, Every 6 hours PRN   pantoprazole  (PROTONIX ) 40 mg, Oral, Daily   polyethylene glycol (MIRALAX ) 17 g, Oral, 2 times daily   sertraline  (ZOLOFT ) 50 mg, Daily at bedtime   sucralfate  (CARAFATE ) 1 g tablet 1 tablet, Oral, 3 times daily   Vitamin D ,  Ergocalciferol , (DRISDOL ) 1.25 MG (50000 UNIT) CAPS capsule TAKE 1 CAPSULE EVERY WEEK    Diet Orders (From admission, onward)     Start     Ordered   08/05/24 1631  Diet regular Room service appropriate? Yes; Fluid consistency: Thin  Diet effective now       Question Answer Comment  Room service appropriate? Yes   Fluid consistency: Thin      08/05/24 1630            DVT prophylaxis: enoxaparin   (LOVENOX ) injection 30 mg Start: 08/05/24 2200   Lab Results  Component Value Date   PLT 179 08/07/2024      Code Status: Full Code  Family Communication: No family at bedside  Status is: Inpatient Remains inpatient appropriate because: Severity of illness   Level of care: Med-Surg  Consultants:  none  Objective: Vitals:   08/09/24 0540 08/09/24 0839 08/09/24 0846 08/09/24 0849  BP: 129/69 117/64    Pulse: 86 90    Resp: 19 16    Temp: 98.4 F (36.9 C)     TempSrc: Oral     SpO2:  94% 91% 93%  Weight:      Height:        Intake/Output Summary (Last 24 hours) at 08/09/2024 1103 Last data filed at 08/09/2024 0900 Gross per 24 hour  Intake 120 ml  Output --  Net 120 ml   Wt Readings from Last 3 Encounters:  08/04/24 39.9 kg  07/01/24 41.3 kg  04/23/24 39.9 kg    Examination:  Constitutional: NAD Eyes: no scleral icterus ENMT: Mucous membranes are moist.  Neck: normal, supple Respiratory: Overall very faint distant breath sounds, no wheezing Cardiovascular: Regular rate and rhythm, no murmurs / rubs / gallops. No LE edema.  Abdomen: non distended, no tenderness. Bowel sounds positive.  Musculoskeletal: no clubbing / cyanosis.   Data Reviewed: I have independently reviewed following labs and imaging studies   CBC Recent Labs  Lab 08/04/24 2058 08/05/24 0604 08/06/24 0556 08/07/24 0559  WBC 5.9 4.7 8.1 9.2  HGB 14.0 12.7 11.5* 11.9*  HCT 42.8 38.4 34.5* 37.0  PLT 130* 103* 131* 179  MCV 92.0 91.9 93.2 94.9  MCH 30.1 30.4 31.1 30.5  MCHC 32.7 33.1 33.3 32.2  RDW 12.1 12.1 12.3 12.3  LYMPHSABS  --   --   --  1.5  MONOABS  --   --   --  0.7  EOSABS  --   --   --  0.0  BASOSABS  --   --   --  0.0    Recent Labs  Lab 08/04/24 2058 08/05/24 0604 08/06/24 0556 08/07/24 0559  NA 139 140 140 141  K 4.0 3.1* 4.0 4.3  CL 100 101 104 104  CO2 26 28 29 31   GLUCOSE 169* 208* 103* 90  BUN 11 9 10 17   CREATININE 0.81 0.71 0.66 0.69  CALCIUM  8.8*  8.7* 8.7* 8.8*    ------------------------------------------------------------------------------------------------------------------ No results for input(s): CHOL, HDL, LDLCALC, TRIG, CHOLHDL, LDLDIRECT in the last 72 hours.  Lab Results  Component Value Date   HGBA1C 6.2 07/05/2023   ------------------------------------------------------------------------------------------------------------------ No results for input(s): TSH, T4TOTAL, T3FREE, THYROIDAB in the last 72 hours.  Invalid input(s): FREET3  Cardiac Enzymes No results for input(s): CKMB, TROPONINI, MYOGLOBIN in the last 168 hours.  Invalid input(s): CK ------------------------------------------------------------------------------------------------------------------ No results found for: BNP  CBG: No results for input(s): GLUCAP in the last 168 hours.  Recent Results (  from the past 240 hours)  Resp panel by RT-PCR (RSV, Flu A&B, Covid) Anterior Nasal Swab     Status: Abnormal   Collection Time: 08/04/24 11:34 PM   Specimen: Anterior Nasal Swab  Result Value Ref Range Status   SARS Coronavirus 2 by RT PCR POSITIVE (A) NEGATIVE Final    Comment: (NOTE) SARS-CoV-2 target nucleic acids are DETECTED.  The SARS-CoV-2 RNA is generally detectable in upper respiratory specimens during the acute phase of infection. Positive results are indicative of the presence of the identified virus, but do not rule out bacterial infection or co-infection with other pathogens not detected by the test. Clinical correlation with patient history and other diagnostic information is necessary to determine patient infection status. The expected result is Negative.  Fact Sheet for Patients: bloggercourse.com  Fact Sheet for Healthcare Providers: seriousbroker.it  This test is not yet approved or cleared by the [place]  FDA and  has been authorized for  detection and/or diagnosis of SARS-CoV-2 by FDA under an Emergency Use Authorization (EUA).  This EUA will remain in effect (meaning this test can be used) for the duration of  the COVID-19 declaration under Section 564(b)(1) of the A ct, 21 U.S.C. section 360bbb-3(b)(1), unless the authorization is terminated or revoked sooner.     Influenza A by PCR NEGATIVE NEGATIVE Final   Influenza B by PCR NEGATIVE NEGATIVE Final    Comment: (NOTE) The Xpert Xpress SARS-CoV-2/FLU/RSV plus assay is intended as an aid in the diagnosis of influenza from Nasopharyngeal swab specimens and should not be used as a sole basis for treatment. Nasal washings and aspirates are unacceptable for Xpert Xpress SARS-CoV-2/FLU/RSV testing.  Fact Sheet for Patients: bloggercourse.com  Fact Sheet for Healthcare Providers: seriousbroker.it  This test is not yet approved or cleared by the [place]  FDA and has been authorized for detection and/or diagnosis of SARS-CoV-2 by FDA under an Emergency Use Authorization (EUA). This EUA will remain in effect (meaning this test can be used) for the duration of the COVID-19 declaration under Section 564(b)(1) of the Act, 21 U.S.C. section 360bbb-3(b)(1), unless the authorization is terminated or revoked.     Resp Syncytial Virus by PCR NEGATIVE NEGATIVE Final    Comment: (NOTE) Fact Sheet for Patients: bloggercourse.com  Fact Sheet for Healthcare Providers: seriousbroker.it  This test is not yet approved or cleared by the [place]  FDA and has been authorized for detection and/or diagnosis of SARS-CoV-2 by FDA under an Emergency Use Authorization (EUA). This EUA will remain in effect (meaning this test can be used) for the duration of the COVID-19 declaration under Section 564(b)(1) of the Act, 21 U.S.C. section 360bbb-3(b)(1), unless the authorization is  terminated or revoked.  Performed at [facility], [address] [place], [place] [address]      Radiology Studies: No results found.   Nilda Fendt, MD, PhD [facility]  Between 7 am - 7 pm I am available, please contact me via Amion (for emergencies) or Securechat (non urgent messages)  Between 7 pm - 7 am I am not available, please contact night coverage MD/APP via Amion  "

## 2024-08-10 LAB — CBC
HCT: 37.4 % (ref 36.0–46.0)
Hemoglobin: 12.4 g/dL (ref 12.0–15.0)
MCH: 30.5 pg (ref 26.0–34.0)
MCHC: 33.2 g/dL (ref 30.0–36.0)
MCV: 92.1 fL (ref 80.0–100.0)
Platelets: 255 K/uL (ref 150–400)
RBC: 4.06 MIL/uL (ref 3.87–5.11)
RDW: 12.2 % (ref 11.5–15.5)
WBC: 8.1 K/uL (ref 4.0–10.5)
nRBC: 0 % (ref 0.0–0.2)

## 2024-08-10 LAB — COMPREHENSIVE METABOLIC PANEL WITH GFR
ALT: 21 U/L (ref 0–44)
AST: 22 U/L (ref 15–41)
Albumin: 3.6 g/dL (ref 3.5–5.0)
Alkaline Phosphatase: 72 U/L (ref 38–126)
Anion gap: 7 (ref 5–15)
BUN: 18 mg/dL (ref 8–23)
CO2: 32 mmol/L (ref 22–32)
Calcium: 9 mg/dL (ref 8.9–10.3)
Chloride: 102 mmol/L (ref 98–111)
Creatinine, Ser: 0.76 mg/dL (ref 0.44–1.00)
GFR, Estimated: >60 mL/min
Glucose, Bld: 95 mg/dL (ref 70–99)
Potassium: 4.1 mmol/L (ref 3.5–5.1)
Sodium: 141 mmol/L (ref 135–145)
Total Bilirubin: 0.2 mg/dL (ref 0.0–1.2)
Total Protein: 5.8 g/dL — ABNORMAL LOW (ref 6.5–8.1)

## 2024-08-10 LAB — MAGNESIUM: Magnesium: 2.2 mg/dL (ref 1.7–2.4)

## 2024-08-10 NOTE — Progress Notes (Signed)
 " PROGRESS NOTE  KASI LASKY FMW:[id number] DOB: 1945-11-18 DOA: 08/04/2024 PCP: Antonio Cyndee [name], DO   LOS: 5 days   Brief Narrative / Interim history: 79 year old female with history of COPD but not on home oxygen, ongoing tobacco use but has not smoked in the past 3 weeks, HTN, HLD, anxiety/depression comes into the hospital with a week of cough, shortness of breath, headaches.  She was apparently diagnosed with COVID-19 on July 30, 2024, treated conservatively.  With increased wheezing and shortness of breath decided to come to the ER and was admitted to the hospital on 1/5  Subjective / 24h Interval events: Continues to complain of generalized weakness.  She was able to barely ambulate yesterday with some assistance but is concerned that she is living alone.  She wants to walk more today  Assesement and Plan: Principal problem COPD exacerbation secondary COVID-19 infection -patient tested positive for COVID originally about 12 days ago, here in the hospital when she presented tested positive again on 1/5 - Continue supportive care with nebulizers, steroids, wheezing seems to be almost entirely resolved - Has been weaned off to room air - Remains quite weak, discussed with bedside RN to continue to encouraged ambulation and she should ideally walk at least twice in the morning and twice in the afternoon.  She lives by herself, anticipate home tomorrow if she is ambulating well today  Active problems Tobacco use-counseled for cessation  Essential hypertension-continue lisinopril , blood pressure is acceptable  CAD-no chest pain, continue Plavix , atorvastatin   Hyperlipidemia-continue statin  Depression-continue sertraline   Scheduled Meds:  arformoterol   15 mcg Nebulization BID   atorvastatin   40 mg Oral Daily   budesonide  (PULMICORT ) nebulizer solution  0.25 mg Nebulization BID   clopidogrel   75 mg Oral Daily   enoxaparin  (LOVENOX ) injection  30 mg Subcutaneous QHS    famotidine   20 mg Oral BID   lisinopril   20 mg Oral Daily   LORazepam   0.5 mg Oral QHS   methylPREDNISolone  (SOLU-MEDROL ) injection  40 mg Intravenous Daily   nicotine   21 mg Transdermal Daily   nystatin   5 mL Mouth/Throat QID   sertraline   25 mg Oral QHS   Continuous Infusions: PRN Meds:.acetaminophen  **OR** acetaminophen , bisacodyl , guaiFENesin , ipratropium-albuterol , menthol , ondansetron  **OR** ondansetron  (ZOFRAN ) IV, senna-docusate  Current Outpatient Medications  Medication Instructions   albuterol  (VENTOLIN  HFA) 108 (90 Base) MCG/ACT inhaler TAKE 2 PUFFS BY MOUTH EVERY 6 HOURS AS NEEDED   albuterol  (VENTOLIN  HFA) 108 (90 Base) MCG/ACT inhaler 2 puffs, Inhalation, Every 4 hours PRN   atorvastatin  (LIPITOR ) 40 mg, Oral, Daily   clopidogrel  (PLAVIX ) 75 mg, Oral, Daily   dicyclomine  (BENTYL ) 10 MG capsule TAKE 1 CAPSULE BY MOUTH AS NEEDED FOR UP TO 1 DOSE FOR SPASMS.   estradiol  (CLIMARA  - DOSED IN MG/24 HR) 0.1 mg, Every 14 days   Evolocumab  (REPATHA  SURECLICK) 140 MG/ML SOAJ INJECT 1 DOSE INTO THE SKIN EVERY 14 (FOURTEEN) DAYS.   ezetimibe  (ZETIA ) 10 mg, Oral, Daily   famotidine  (PEPCID ) 20 mg, Oral, 2 times daily   fluticasone  (FLONASE ) 50 MCG/ACT nasal spray 2 sprays, Each Nare, Daily   LAGEVRIO 200 MG CAPS capsule 4 capsules, Oral, Every 12 hours   linaclotide  (LINZESS ) 72 mcg, Oral, Daily before breakfast   lisinopril  (ZESTRIL ) 20 mg, Oral, Daily   loratadine  (CLARITIN ) 10 mg, Oral, Daily   LORazepam  (ATIVAN ) 0.5 mg, Daily at bedtime   ondansetron  (ZOFRAN -ODT) 4 mg, Oral, Every 6 hours PRN   pantoprazole  (PROTONIX ) 40  mg, Oral, Daily   polyethylene glycol (MIRALAX ) 17 g, Oral, 2 times daily   sertraline  (ZOLOFT ) 50 mg, Daily at bedtime   sucralfate  (CARAFATE ) 1 g tablet 1 tablet, Oral, 3 times daily   Vitamin D , Ergocalciferol , (DRISDOL ) 1.25 MG (50000 UNIT) CAPS capsule TAKE 1 CAPSULE EVERY WEEK    Diet Orders (From admission, onward)     Start     Ordered   08/05/24  1631  Diet regular Room service appropriate? Yes; Fluid consistency: Thin  Diet effective now       Question Answer Comment  Room service appropriate? Yes   Fluid consistency: Thin      08/05/24 1630            DVT prophylaxis: enoxaparin  (LOVENOX ) injection 30 mg Start: 08/05/24 2200   Lab Results  Component Value Date   PLT 255 08/10/2024      Code Status: Full Code  Family Communication: No family at bedside  Status is: Inpatient Remains inpatient appropriate because: Severity of illness   Level of care: Med-Surg  Consultants:  none  Objective: Vitals:   08/09/24 2101 08/10/24 0553 08/10/24 0936 08/10/24 0937  BP: 122/68 (!) 150/75    Pulse: 92 92    Resp: 20     Temp: 98.7 F (37.1 C)     TempSrc: Oral     SpO2: 94% 94% 95% 95%  Weight:      Height:        Intake/Output Summary (Last 24 hours) at 08/10/2024 1034 Last data filed at 08/10/2024 0900 Gross per 24 hour  Intake 720 ml  Output --  Net 720 ml   Wt Readings from Last 3 Encounters:  08/04/24 39.9 kg  07/01/24 41.3 kg  04/23/24 39.9 kg    Examination:  Constitutional: NAD Eyes: lids and conjunctivae normal, no scleral icterus ENMT: mmm Neck: normal, supple Respiratory: clear to auscultation bilaterally, no wheezing, no crackles. Normal respiratory effort.  Distant breath sounds Cardiovascular: Regular rate and rhythm, no murmurs / rubs / gallops. No LE edema. Abdomen: soft, no distention, no tenderness. Bowel sounds positive.    Data Reviewed: I have independently reviewed following labs and imaging studies   CBC Recent Labs  Lab 08/04/24 2058 08/05/24 0604 08/06/24 0556 08/07/24 0559 08/10/24 0538  WBC 5.9 4.7 8.1 9.2 8.1  HGB 14.0 12.7 11.5* 11.9* 12.4  HCT 42.8 38.4 34.5* 37.0 37.4  PLT 130* 103* 131* 179 255  MCV 92.0 91.9 93.2 94.9 92.1  MCH 30.1 30.4 31.1 30.5 30.5  MCHC 32.7 33.1 33.3 32.2 33.2  RDW 12.1 12.1 12.3 12.3 12.2  LYMPHSABS  --   --   --  1.5  --    MONOABS  --   --   --  0.7  --   EOSABS  --   --   --  0.0  --   BASOSABS  --   --   --  0.0  --     Recent Labs  Lab 08/04/24 2058 08/05/24 0604 08/06/24 0556 08/07/24 0559 08/10/24 0538  NA 139 140 140 141 141  K 4.0 3.1* 4.0 4.3 4.1  CL 100 101 104 104 102  CO2 26 28 29 31  32  GLUCOSE 169* 208* 103* 90 95  BUN 11 9 10 17 18   CREATININE 0.81 0.71 0.66 0.69 0.76  CALCIUM  8.8* 8.7* 8.7* 8.8* 9.0  AST  --   --   --   --  22  ALT  --   --   --   --  21  ALKPHOS  --   --   --   --  72  BILITOT  --   --   --   --  0.2  ALBUMIN  --   --   --   --  3.6  MG  --   --   --   --  2.2    ------------------------------------------------------------------------------------------------------------------ No results for input(s): CHOL, HDL, LDLCALC, TRIG, CHOLHDL, LDLDIRECT in the last 72 hours.  Lab Results  Component Value Date   HGBA1C 6.2 07/05/2023   ------------------------------------------------------------------------------------------------------------------ No results for input(s): TSH, T4TOTAL, T3FREE, THYROIDAB in the last 72 hours.  Invalid input(s): FREET3  Cardiac Enzymes No results for input(s): CKMB, TROPONINI, MYOGLOBIN in the last 168 hours.  Invalid input(s): CK ------------------------------------------------------------------------------------------------------------------ No results found for: BNP  CBG: No results for input(s): GLUCAP in the last 168 hours.  Recent Results (from the past 240 hours)  Resp panel by RT-PCR (RSV, Flu A&B, Covid) Anterior Nasal Swab     Status: Abnormal   Collection Time: 08/04/24 11:34 PM   Specimen: Anterior Nasal Swab  Result Value Ref Range Status   SARS Coronavirus 2 by RT PCR POSITIVE (A) NEGATIVE Final    Comment: (NOTE) SARS-CoV-2 target nucleic acids are DETECTED.  The SARS-CoV-2 RNA is generally detectable in upper respiratory specimens during the acute phase of infection.  Positive results are indicative of the presence of the identified virus, but do not rule out bacterial infection or co-infection with other pathogens not detected by the test. Clinical correlation with patient history and other diagnostic information is necessary to determine patient infection status. The expected result is Negative.  Fact Sheet for Patients: bloggercourse.com  Fact Sheet for Healthcare Providers: seriousbroker.it  This test is not yet approved or cleared by the [place]  FDA and  has been authorized for detection and/or diagnosis of SARS-CoV-2 by FDA under an Emergency Use Authorization (EUA).  This EUA will remain in effect (meaning this test can be used) for the duration of  the COVID-19 declaration under Section 564(b)(1) of the A ct, 21 U.S.C. section 360bbb-3(b)(1), unless the authorization is terminated or revoked sooner.     Influenza A by PCR NEGATIVE NEGATIVE Final   Influenza B by PCR NEGATIVE NEGATIVE Final    Comment: (NOTE) The Xpert Xpress SARS-CoV-2/FLU/RSV plus assay is intended as an aid in the diagnosis of influenza from Nasopharyngeal swab specimens and should not be used as a sole basis for treatment. Nasal washings and aspirates are unacceptable for Xpert Xpress SARS-CoV-2/FLU/RSV testing.  Fact Sheet for Patients: bloggercourse.com  Fact Sheet for Healthcare Providers: seriousbroker.it  This test is not yet approved or cleared by the [place]  FDA and has been authorized for detection and/or diagnosis of SARS-CoV-2 by FDA under an Emergency Use Authorization (EUA). This EUA will remain in effect (meaning this test can be used) for the duration of the COVID-19 declaration under Section 564(b)(1) of the Act, 21 U.S.C. section 360bbb-3(b)(1), unless the authorization is terminated or revoked.     Resp Syncytial Virus by PCR  NEGATIVE NEGATIVE Final    Comment: (NOTE) Fact Sheet for Patients: bloggercourse.com  Fact Sheet for Healthcare Providers: seriousbroker.it  This test is not yet approved or cleared by the [place]  FDA and has been authorized for detection and/or diagnosis of SARS-CoV-2 by FDA under an Emergency Use Authorization (EUA). This EUA will remain in effect (meaning  this test can be used) for the duration of the COVID-19 declaration under Section 564(b)(1) of the Act, 21 U.S.C. section 360bbb-3(b)(1), unless the authorization is terminated or revoked.  Performed at [facility], [address] [place], [place] [address]      Radiology Studies: No results found.   Nilda Fendt, MD, PhD [facility]  Between 7 am - 7 pm I am available, please contact me via Amion (for emergencies) or Securechat (non urgent messages)  Between 7 pm - 7 am I am not available, please contact night coverage MD/APP via Amion  "

## 2024-08-10 NOTE — Progress Notes (Signed)
" °   08/10/24 1303  Assess: MEWS Score  Temp 98.4 F (36.9 C)  BP 134/78  MAP (mmHg) 92  Pulse Rate (!) 115  Resp 18  SpO2 94 %  O2 Device Room Air  Assess: MEWS Score  MEWS Temp 0  MEWS Systolic 0  MEWS Pulse 2  MEWS RR 0  MEWS LOC 0  MEWS Score 2  MEWS Score Color Yellow  Assess: if the MEWS score is Yellow or Red  Were vital signs accurate and taken at a resting state? Yes  Does the patient meet 2 or more of the SIRS criteria? No  Does the patient have a confirmed or suspected source of infection? No  MEWS guidelines implemented  Yes, yellow  Treat  MEWS Interventions Considered administering scheduled or prn medications/treatments as ordered  Take Vital Signs  Increase Vital Sign Frequency  Yellow: Q2hr x1, continue Q4hrs until patient remains green for 12hrs  Escalate  MEWS: Escalate Yellow: Discuss with charge nurse and consider notifying provider and/or RRT  Notify: Charge Nurse/RN  Name of Charge Nurse/RN Notified [facility]  Provider Notification  Provider Name/Title Gherghe,costin  Date Provider Notified 08/10/24  Time Provider Notified 1317  Method of Notification Page  Notification Reason Change in status  Provider response No new orders  Date of Provider Response 08/10/24  Time of Provider Response 1318  Assess: SIRS CRITERIA  SIRS Temperature  0  SIRS Respirations  0  SIRS Pulse 1  SIRS WBC 0  SIRS Score Sum  1    "

## 2024-08-10 NOTE — Plan of Care (Signed)
°  Problem: Education: Goal: Knowledge of General Education information will improve Description: Including pain rating scale, medication(s)/side effects and non-pharmacologic comfort measures Outcome: Progressing   Problem: Clinical Measurements: Goal: Will remain free from infection Outcome: Progressing Goal: Respiratory complications will improve Outcome: Progressing   Problem: Coping: Goal: Level of anxiety will decrease Outcome: Progressing   Problem: Elimination: Goal: Will not experience complications related to bowel motility Outcome: Progressing Goal: Will not experience complications related to urinary retention Outcome: Progressing   Problem: Safety: Goal: Ability to remain free from injury will improve Outcome: Progressing

## 2024-08-11 ENCOUNTER — Other Ambulatory Visit (HOSPITAL_COMMUNITY): Payer: Self-pay

## 2024-08-11 MED ORDER — BUDESONIDE-FORMOTEROL FUMARATE 80-4.5 MCG/ACT IN AERO
2.0000 | INHALATION_SPRAY | Freq: Two times a day (BID) | RESPIRATORY_TRACT | 12 refills | Status: DC
  Filled 2024-08-11: qty 10.2, 30d supply, fill #0

## 2024-08-11 MED ORDER — METHYLPREDNISOLONE SODIUM SUCC 40 MG IJ SOLR: 40.0000 mg | Freq: Every day | INTRAMUSCULAR | Status: DC

## 2024-08-11 MED ORDER — BISACODYL 10 MG RE SUPP
10.0000 mg | Freq: Once | RECTAL | Status: DC
  Filled 2024-08-11: qty 1

## 2024-08-11 MED ORDER — NICOTINE 21 MG/24HR TD PT24
21.0000 mg | MEDICATED_PATCH | Freq: Every day | TRANSDERMAL | 0 refills | Status: AC
  Filled 2024-08-11 (×2): qty 28, 28d supply, fill #0

## 2024-08-11 MED ORDER — POLYETHYLENE GLYCOL 3350 17 G PO PACK: 17.0000 g | PACK | Freq: Every day | ORAL | Status: DC

## 2024-08-11 NOTE — Plan of Care (Signed)

## 2024-08-11 NOTE — Progress Notes (Signed)
 Discharge meds in a secure bag delivered to patient by this RN

## 2024-08-11 NOTE — TOC Transition Note (Signed)
 Transition of Care [facility]) - Discharge Note   Patient Details  Name: Leah [name] MRN: [id number] Date of Birth: July 25, 1946  Transition of Care [facility]) CM/SW Contact:  Heather DELENA Saltness, LCSW Phone Number: 08/11/2024, 4:16 PM   Clinical Narrative:    Pt discharging home today with [facility] services. HH PT/OT services set up with [facility]. Pt has no DME needs. HH orders placed. Pt's daughter, Lorie Loredo, will transport pt home upon discharge. No further TOC needs at this time.   Final next level of care: Home w Home Health Services Barriers to Discharge: Barriers Resolved   Patient Goals and CMS Choice Patient states their goals for this hospitalization and ongoing recovery are:: To return home CMS Medicare.gov Compare Post Acute Care list provided to:: Patient Choice offered to / list presented to : Patient [place] ownership interest in [facility]provided to:: Patient    Discharge Placement  Home  Patient to be transferred to facility by: Daughter Name of family member notified: Patient Patient and family notified of of transfer: 08/11/24  Discharge Plan and Services Additional resources added to the After Visit Summary for  Follow Up In-house Referral: Clinical Social Work Discharge Planning Services: NA Post Acute Care Choice: Home Health          DME Arranged: N/A DME Agency: NA       HH Arranged: PT, OT HH Agency: [facility] Date HH Agency Contacted: 08/11/24 Time HH Agency Contacted: 1615 Representative spoke with at [facility] Agency: Arna  Social Drivers of Health (SDOH) Interventions SDOH Screenings   Food Insecurity: No Food Insecurity (08/05/2024)  Housing: Low Risk (08/05/2024)  Transportation Needs: No Transportation Needs (08/05/2024)  Utilities: Not At Risk (08/05/2024)  Depression (PHQ2-9): Low Risk (03/06/2023)  Social Connections: Socially Isolated (08/05/2024)  Tobacco Use: High Risk (08/04/2024)     Readmission Risk Interventions     08/07/2024    9:44 AM  Readmission Risk Prevention Plan  Transportation Screening Complete  PCP or Specialist Appt within 5-7 Days Complete  Home Care Screening Complete  Medication Review (RN CM) Complete    Signed: Heather Saltness, MSW, LCSW Clinical Social Worker Inpatient Care Management 08/11/2024 4:17 PM

## 2024-08-11 NOTE — Discharge Summary (Addendum)
 Physician Discharge Summary  Leah [name] [name] FMW:[id number] DOB: [birth year] DOA: 08/04/2024  PCP: Leah [name] Cyndee [name], DO  Admit date: 08/04/2024 Discharge date: 08/11/2024  Admitted From: Home Disposition: Home with home health  Recommendations for Outpatient Follow-up:  Follow up with PCP in 1-2 weeks Please obtain BMP/CBC in one week Schedule follow-up with the pulmonary  Home Health: PT/OT Equipment/Devices: N/A  Discharge Condition: Stable CODE STATUS: Full code Diet recommendation: Low-salt diet  Discharge summary: 79 year old with history of COPD but not on home oxygen, ongoing smoker, hypertension hyperlipidemia, anxiety and depression came to the hospital with about a week of cough and shortness of breath.  Headaches.  Recently diagnosed with COVID-19 and treated conservatively.  She stayed in the hospital because of ongoing wheezing and difficulty mobility.  Ultimately improved.  Going home today.  COPD with acute exacerbation secondary to COVID-19 infection: COVID-19 diagnosed 12/31.  Persistently symptomatic triggered by COVID-19 infection. Treated with IV Solu-Medrol , bronchodilator therapy, steroid inhaler.  Did good clinical recovery today. Patient on room air. Patient will be discharged on Albuterol  as needed Start Symbicort  or equivalent as covered by insurance. Does not need oxygen. Strongly recommended smoking cessation.  Nicotine  patch provided. She does follow-up with pulmonary, she will schedule a follow-up. Will benefit with physical therapy and Occupational Therapy at home, this will be prescribed.   Chronic medical issues including Hypertension, stable on lisinopril , resume Coronary artery disease, stable on Plavix  and atorvastatin .  Resume Hyperlipidemia, on statin and Repatha .  Resume. Depression, on sertraline .  Uses Ativan  at night.  Stabilized to discharge home with close outpatient follow-up.  Addendum:  Patient was prescribed Advair discus  as she has been receiving Pulmicort  in the hospital without any issues and side effects, however patient declined to use Advair given side effects to steroids.  This was discontinued.  She will follow-up with pulmonary.   Discharge Diagnoses:  Principal Problem:   COPD with acute exacerbation (HCC) Active Problems:   COVID-19   Acute hypoxic respiratory failure (HCC)   Hypokalemia   Cervical radiculopathy   Spondylosis of cervical spine   Anxiety and depression    Discharge Instructions  Discharge Instructions     Diet - low sodium heart healthy   Complete by: As directed    Increase activity slowly   Complete by: As directed       Allergies as of 08/11/2024       Reactions   Amoxicillin-pot Clavulanate Shortness Of Breath, Itching, Swelling   Avelox [moxifloxacin Hcl In Nacl] Shortness Of Breath, Itching, Swelling   Ciprofloxacin Shortness Of Breath, Itching, Swelling   Keflex  [cephalexin ] Shortness Of Breath, Swelling   Moxifloxacin Anaphylaxis, Shortness Of Breath, Itching, Swelling, Other (See Comments)   Throat swells   Penicillins Anaphylaxis, Shortness Of Breath, Itching, Swelling, Other (See Comments)   Did it involve swelling of the face/tongue/throat, SOB, or low BP? Yes Did it involve sudden or severe rash/hives, skin peeling, or any reaction on the inside of your mouth or nose? No Did you need to seek medical attention at a hospital or doctor's office? Yes When did it last happen? Several years ago- throat swells   Sulfa Antibiotics Shortness Of Breath, Itching, Swelling   Clindamycin /lincomycin Itching, Swelling   Levaquin  [levofloxacin ] Hives   Metronidazole  Other (See Comments)   Pt unsure of reaction   Prednisone  Other (See Comments)   GI irritation; is able to tolerate injections   Tizanidine  Other (See Comments)   Pt unsure of reaction  Tramadol  Itching   Amoxicillin Other (See Comments)   Reaction??   Clavulanic Acid Other (See Comments)    Reaction??   Dextrose  Other (See Comments)   Reaction??   Sulfur  Other (See Comments)   Reaction??   Acyclovir And Related Other (See Comments)   Pt does not recall this reaction   Hydrocodone  Rash   Zyvox  [linezolid ] Diarrhea        Medication List     TAKE these medications    albuterol  108 (90 Base) MCG/ACT inhaler Commonly known as: VENTOLIN  HFA Inhale 2 puffs into the lungs every 4 (four) hours as needed for wheezing or shortness of breath. What changed: Another medication with the same name was removed. Continue taking this medication, and follow the directions you see here.   atorvastatin  40 MG tablet Commonly known as: LIPITOR  Take 1 tablet (40 mg total) by mouth daily.   clopidogrel  75 MG tablet Commonly known as: PLAVIX  TAKE 1 TABLET EVERY DAY   dicyclomine  10 MG capsule Commonly known as: BENTYL  TAKE 1 CAPSULE BY MOUTH AS NEEDED FOR UP TO 1 DOSE FOR SPASMS. What changed:  how much to take how to take this when to take this reasons to take this additional instructions   estradiol  0.1 mg/24hr patch Commonly known as: CLIMARA  - Dosed in mg/24 hr Place 0.1 mg onto the skin every 14 (fourteen) days.   ezetimibe  10 MG tablet Commonly known as: ZETIA  TAKE 1 TABLET EVERY DAY   famotidine  20 MG tablet Commonly known as: PEPCID  TAKE 1 TABLET TWICE DAILY What changed:  when to take this reasons to take this   fluticasone  50 MCG/ACT nasal spray Commonly known as: FLONASE  Place 2 sprays into both nostrils daily. What changed:  when to take this reasons to take this   Lagevrio 200 MG Caps capsule Generic drug: molnupiravir EUA Take 4 capsules by mouth every 12 (twelve) hours.   linaclotide  72 MCG capsule Commonly known as: Linzess  Take 1 capsule (72 mcg total) by mouth daily before breakfast. What changed:  when to take this reasons to take this   lisinopril  20 MG tablet Commonly known as: ZESTRIL  Take 1 tablet (20 mg total) by mouth daily.    loratadine  10 MG tablet Commonly known as: CLARITIN  Take 1 tablet (10 mg total) by mouth daily.   LORazepam  0.5 MG tablet Commonly known as: ATIVAN  Take 0.5 mg by mouth at bedtime.   nicotine  21 mg/24hr patch Commonly known as: NICODERM CQ  - dosed in mg/24 hours Place 1 patch (21 mg total) onto the skin daily. Start taking on: August 12, 2024   ondansetron  4 MG disintegrating tablet Commonly known as: ZOFRAN -ODT Take 1 tablet (4 mg total) by mouth every 6 (six) hours as needed for nausea or vomiting. What changed: reasons to take this   pantoprazole  40 MG tablet Commonly known as: PROTONIX  Take 1 tablet (40 mg total) by mouth daily. What changed:  when to take this reasons to take this   polyethylene glycol 17 g packet Commonly known as: MiraLax  Take 17 g by mouth 2 (two) times daily. What changed:  when to take this reasons to take this   Repatha  SureClick 140 MG/ML Soaj Generic drug: Evolocumab  INJECT 1 DOSE INTO THE SKIN EVERY 14 (FOURTEEN) DAYS. What changed: See the new instructions.   sertraline  50 MG tablet Commonly known as: ZOLOFT  Take 50 mg by mouth at bedtime.   sucralfate  1 g tablet Commonly known as: CARAFATE  Take 1 tablet  by mouth 3 (three) times daily.   Vitamin D  (Ergocalciferol ) 1.25 MG (50000 UNIT) Caps capsule Commonly known as: DRISDOL  TAKE 1 CAPSULE EVERY WEEK What changed: See the new instructions.        Contact information for follow-up providers     Leah [name] Meth, Jamee SAUNDERS, DO. Call in 1 week(s).   Specialty: Family Medicine Contact information: [address] [place] [address] [phone]              Contact information for after-discharge care     Home Medical Care     [facility] .   Service: Home Health Services Why: This provider will reach out to you 24-48 hours after discharge to begin Home Health PT/OT services. If you wish to cancel this service, please call  [phone]. Contact information: [address] [place]  [id number] [phone]                    Allergies[1]  Consultations: None   Procedures/Studies: DG Chest 2 View Result Date: 08/04/2024 EXAM: 2 VIEW(S) XRAY OF THE CHEST 08/04/2024 09:13:00 PM COMPARISON: PA and lateral chest 09/24/2023. CLINICAL HISTORY: Shortness of breath, fever, and coughing with general malaise. Positive COVID-19 diagnosis. FINDINGS: LUNGS AND PLEURA: The lungs are moderately emphysematous. No focal pulmonary opacity. No pleural effusion. No pneumothorax. HEART AND MEDIASTINUM: The cardiac size is normal. There is patchy calcification of the aorta. The mediastinum is normally outlined. BONES AND SOFT TISSUES: Osteopenia and thoracic kyphosis. 3-level lower cervical ACDF plating hardware is again seen. IMPRESSION: 1. No acute radiographic findings. 2. Moderate emphysema. Stable COPD chest . Electronically signed by: Francis Quam MD 08/04/2024 09:19 PM EST RP Workstation: [id number]   (Echo, Carotid, EGD, Colonoscopy, ERCP)    Subjective: Patient seen and examined in the morning rounds.  She had some constipation issues that resolved after Dulcolax.  She was reexamined in the late afternoon for discharge readiness.  Patient tells me that she can manage her symptoms at home and needs to go home as her daughter is on her way to pick her up.  Does have some dry cough but denies any wheezing or shortness of breath.   Discharge Exam: Vitals:   08/11/24 0820 08/11/24 1447  BP:  114/71  Pulse:  80  Resp:    Temp:  97.7 F (36.5 C)  SpO2: 91% 95%   Vitals:   08/10/24 2121 08/11/24 0515 08/11/24 0820 08/11/24 1447  BP:  135/70  114/71  Pulse:  93  80  Resp:  20    Temp:  99.6 F (37.6 C)  97.7 F (36.5 C)  TempSrc:  Oral  Oral  SpO2: 91%  91% 95%  Weight:      Height:        General: Pt is alert, awake, not in acute distress Mildly anxious. Cardiovascular: RRR, S1/S2 +, no  rubs, no gallops Respiratory: CTA bilaterally, occasional expiratory wheezing, no rhonchi Abdominal: Soft, NT, ND, bowel sounds + Extremities: no edema, no cyanosis    The results of significant diagnostics from this hospitalization (including imaging, microbiology, ancillary and laboratory) are listed below for reference.     Microbiology: Recent Results (from the past 240 hours)  Resp panel by RT-PCR (RSV, Flu A&B, Covid) Anterior Nasal Swab     Status: Abnormal   Collection Time: 08/04/24 11:34 PM   Specimen: Anterior Nasal Swab  Result Value Ref Range Status  SARS Coronavirus 2 by RT PCR POSITIVE (A) NEGATIVE Final    Comment: (NOTE) SARS-CoV-2 target nucleic acids are DETECTED.  The SARS-CoV-2 RNA is generally detectable in upper respiratory specimens during the acute phase of infection. Positive results are indicative of the presence of the identified virus, but do not rule out bacterial infection or co-infection with other pathogens not detected by the test. Clinical correlation with patient history and other diagnostic information is necessary to determine patient infection status. The expected result is Negative.  Fact Sheet for Patients: bloggercourse.com  Fact Sheet for Healthcare Providers: seriousbroker.it  This test is not yet approved or cleared by the [place]  FDA and  has been authorized for detection and/or diagnosis of SARS-CoV-2 by FDA under an Emergency Use Authorization (EUA).  This EUA will remain in effect (meaning this test can be used) for the duration of  the COVID-19 declaration under Section 564(b)(1) of the A ct, 21 U.S.C. section 360bbb-3(b)(1), unless the authorization is terminated or revoked sooner.     Influenza A by PCR NEGATIVE NEGATIVE Final   Influenza B by PCR NEGATIVE NEGATIVE Final    Comment: (NOTE) The Xpert Xpress SARS-CoV-2/FLU/RSV plus assay is intended as an aid in the  diagnosis of influenza from Nasopharyngeal swab specimens and should not be used as a sole basis for treatment. Nasal washings and aspirates are unacceptable for Xpert Xpress SARS-CoV-2/FLU/RSV testing.  Fact Sheet for Patients: bloggercourse.com  Fact Sheet for Healthcare Providers: seriousbroker.it  This test is not yet approved or cleared by the [place]  FDA and has been authorized for detection and/or diagnosis of SARS-CoV-2 by FDA under an Emergency Use Authorization (EUA). This EUA will remain in effect (meaning this test can be used) for the duration of the COVID-19 declaration under Section 564(b)(1) of the Act, 21 U.S.C. section 360bbb-3(b)(1), unless the authorization is terminated or revoked.     Resp Syncytial Virus by PCR NEGATIVE NEGATIVE Final    Comment: (NOTE) Fact Sheet for Patients: bloggercourse.com  Fact Sheet for Healthcare Providers: seriousbroker.it  This test is not yet approved or cleared by the [place]  FDA and has been authorized for detection and/or diagnosis of SARS-CoV-2 by FDA under an Emergency Use Authorization (EUA). This EUA will remain in effect (meaning this test can be used) for the duration of the COVID-19 declaration under Section 564(b)(1) of the Act, 21 U.S.C. section 360bbb-3(b)(1), unless the authorization is terminated or revoked.  Performed at [facility], [address] [place], [place] [address]      Labs: BNP (last 3 results) No results for input(s): BNP in the last 8760 hours. Basic Metabolic Panel: Recent Labs  Lab 08/04/24 2058 08/05/24 0604 08/06/24 0556 08/07/24 0559 08/10/24 0538  NA 139 140 140 141 141  K 4.0 3.1* 4.0 4.3 4.1  CL 100 101 104 104 102  CO2 26 28 29 31  32  GLUCOSE 169* 208* 103* 90 95  BUN 11 9 10 17 18   CREATININE 0.81 0.71 0.66 0.69 0.76  CALCIUM  8.8* 8.7* 8.7* 8.8*  9.0  MG  --   --   --   --  2.2   Liver Function Tests: Recent Labs  Lab 08/10/24 0538  AST 22  ALT 21  ALKPHOS 72  BILITOT 0.2  PROT 5.8*  ALBUMIN 3.6   No results for input(s): LIPASE, AMYLASE in the last 168 hours. No results for input(s): AMMONIA in the last 168 hours. CBC: Recent Labs  Lab  08/04/24 2058 08/05/24 0604 08/06/24 0556 08/07/24 0559 08/10/24 0538  WBC 5.9 4.7 8.1 9.2 8.1  NEUTROABS  --   --   --  6.9  --   HGB 14.0 12.7 11.5* 11.9* 12.4  HCT 42.8 38.4 34.5* 37.0 37.4  MCV 92.0 91.9 93.2 94.9 92.1  PLT 130* 103* 131* 179 255   Cardiac Enzymes: No results for input(s): CKTOTAL, CKMB, CKMBINDEX, TROPONINI in the last 168 hours. BNP: Invalid input(s): POCBNP CBG: No results for input(s): GLUCAP in the last 168 hours. D-Dimer No results for input(s): DDIMER in the last 72 hours. Hgb A1c No results for input(s): HGBA1C in the last 72 hours. Lipid Profile No results for input(s): CHOL, HDL, LDLCALC, TRIG, CHOLHDL, LDLDIRECT in the last 72 hours. Thyroid  function studies No results for input(s): TSH, T4TOTAL, T3FREE, THYROIDAB in the last 72 hours.  Invalid input(s): FREET3 Anemia work up No results for input(s): VITAMINB12, FOLATE, FERRITIN, TIBC, IRON, RETICCTPCT in the last 72 hours. Urinalysis    Component Value Date/Time   COLORURINE COLORLESS (A) 09/04/2019 2315   APPEARANCEUR CLEAR 09/04/2019 2315   LABSPEC 1.013 09/04/2019 2315   PHURINE 6.0 09/04/2019 2315   GLUCOSEU NEGATIVE 09/04/2019 2315   HGBUR NEGATIVE 09/04/2019 2315   BILIRUBINUR Negative 01/10/2022 1513   KETONESUR NEGATIVE 09/04/2019 2315   PROTEINUR Negative 01/10/2022 1513   PROTEINUR NEGATIVE 09/04/2019 2315   UROBILINOGEN 0.2 01/10/2022 1513   UROBILINOGEN 0.2 09/19/2013 1627   NITRITE Negative 01/10/2022 1513   NITRITE NEGATIVE 09/04/2019 2315   LEUKOCYTESUR Negative 01/10/2022 1513   LEUKOCYTESUR NEGATIVE  09/04/2019 2315   Sepsis Labs Recent Labs  Lab 08/05/24 0604 08/06/24 0556 08/07/24 0559 08/10/24 0538  WBC 4.7 8.1 9.2 8.1   Microbiology Recent Results (from the past 240 hours)  Resp panel by RT-PCR (RSV, Flu A&B, Covid) Anterior Nasal Swab     Status: Abnormal   Collection Time: 08/04/24 11:34 PM   Specimen: Anterior Nasal Swab  Result Value Ref Range Status   SARS Coronavirus 2 by RT PCR POSITIVE (A) NEGATIVE Final    Comment: (NOTE) SARS-CoV-2 target nucleic acids are DETECTED.  The SARS-CoV-2 RNA is generally detectable in upper respiratory specimens during the acute phase of infection. Positive results are indicative of the presence of the identified virus, but do not rule out bacterial infection or co-infection with other pathogens not detected by the test. Clinical correlation with patient history and other diagnostic information is necessary to determine patient infection status. The expected result is Negative.  Fact Sheet for Patients: bloggercourse.com  Fact Sheet for Healthcare Providers: seriousbroker.it  This test is not yet approved or cleared by the [place]  FDA and  has been authorized for detection and/or diagnosis of SARS-CoV-2 by FDA under an Emergency Use Authorization (EUA).  This EUA will remain in effect (meaning this test can be used) for the duration of  the COVID-19 declaration under Section 564(b)(1) of the A ct, 21 U.S.C. section 360bbb-3(b)(1), unless the authorization is terminated or revoked sooner.     Influenza A by PCR NEGATIVE NEGATIVE Final   Influenza B by PCR NEGATIVE NEGATIVE Final    Comment: (NOTE) The Xpert Xpress SARS-CoV-2/FLU/RSV plus assay is intended as an aid in the diagnosis of influenza from Nasopharyngeal swab specimens and should not be used as a sole basis for treatment. Nasal washings and aspirates are unacceptable for Xpert Xpress  SARS-CoV-2/FLU/RSV testing.  Fact Sheet for Patients: bloggercourse.com  Fact Sheet for Healthcare Providers: seriousbroker.it  This test is not yet approved or cleared by the [place]  FDA and has been authorized for detection and/or diagnosis of SARS-CoV-2 by FDA under an Emergency Use Authorization (EUA). This EUA will remain in effect (meaning this test can be used) for the duration of the COVID-19 declaration under Section 564(b)(1) of the Act, 21 U.S.C. section 360bbb-3(b)(1), unless the authorization is terminated or revoked.     Resp Syncytial Virus by PCR NEGATIVE NEGATIVE Final    Comment: (NOTE) Fact Sheet for Patients: bloggercourse.com  Fact Sheet for Healthcare Providers: seriousbroker.it  This test is not yet approved or cleared by the [place]  FDA and has been authorized for detection and/or diagnosis of SARS-CoV-2 by FDA under an Emergency Use Authorization (EUA). This EUA will remain in effect (meaning this test can be used) for the duration of the COVID-19 declaration under Section 564(b)(1) of the Act, 21 U.S.C. section 360bbb-3(b)(1), unless the authorization is terminated or revoked.  Performed at [facility], [address] [place], [place] [address]      Time coordinating discharge:  35 minutes  SIGNED:   Renato Applebaum, MD  [facility] 08/11/2024, 4:59 PM     [1]  Allergies Allergen Reactions   Amoxicillin-Pot Clavulanate Shortness Of Breath, Itching and Swelling   Avelox [Moxifloxacin Hcl In Nacl] Shortness Of Breath, Itching and Swelling   Ciprofloxacin Shortness Of Breath, Itching and Swelling   Keflex  [Cephalexin ] Shortness Of Breath and Swelling   Moxifloxacin Anaphylaxis, Shortness Of Breath, Itching, Swelling and Other (See Comments)    Throat swells   Penicillins Anaphylaxis, Shortness Of Breath,  Itching, Swelling and Other (See Comments)    Did it involve swelling of the face/tongue/throat, SOB, or low BP? Yes Did it involve sudden or severe rash/hives, skin peeling, or any reaction on the inside of your mouth or nose? No Did you need to seek medical attention at a hospital or doctor's office? Yes When did it last happen? Several years ago- throat swells   Sulfa Antibiotics Shortness Of Breath, Itching and Swelling   Clindamycin /Lincomycin Itching and Swelling   Levaquin  [Levofloxacin ] Hives   Metronidazole  Other (See Comments)    Pt unsure of reaction   Prednisone  Other (See Comments)    GI irritation; is able to tolerate injections   Tizanidine  Other (See Comments)    Pt unsure of reaction   Tramadol  Itching   Amoxicillin Other (See Comments)    Reaction??   Clavulanic Acid Other (See Comments)    Reaction??   Dextrose  Other (See Comments)    Reaction??   Sulfur  Other (See Comments)    Reaction??   Acyclovir And Related Other (See Comments)    Pt does not recall this reaction   Hydrocodone  Rash   Zyvox  [Linezolid ] Diarrhea

## 2024-08-11 NOTE — TOC Progression Note (Addendum)
 Transition of Care [facility]) - Progression Note    Patient Details  Name: Leah [name] MRN: [id number] Date of Birth: [birth year]  Transition of Care [facility]) CM/SW Contact  Heather DELENA Saltness, LCSW Phone Number: 08/11/2024, 2:16 PM  Clinical Narrative:    CSW spoke with pt to discuss discharge planning and PT's recommendation for [facility] PT/OT services. Pt hesitant about HH services, stating I'm not sure I can do an hour of physical therapy right now. CSW offered to put [facility] agency in discharge paperwork just in case pt changes her mind. Pt agreeable, denies HH agency preference. HH PT/OT set up with [facility], added to AVS. Pt reports her daughter will transport her home upon discharge. TOC will continue to follow.  Expected Discharge Plan: Home w Home Health Services Barriers to Discharge: Continued Medical Work up   Expected Discharge Plan and Services In-house Referral: Clinical Social Work Discharge Planning Services: NA Post Acute Care Choice: Home Health Living arrangements for the past 2 months: Single Family Home                 DME Arranged: N/A DME Agency: NA       HH Arranged: NA HH Agency: NA         Social Drivers of Health (SDOH) Interventions SDOH Screenings   Food Insecurity: No Food Insecurity (08/05/2024)  Housing: Low Risk (08/05/2024)  Transportation Needs: No Transportation Needs (08/05/2024)  Utilities: Not At Risk (08/05/2024)  Depression (PHQ2-9): Low Risk (03/06/2023)  Social Connections: Socially Isolated (08/05/2024)  Tobacco Use: High Risk (08/04/2024)    Readmission Risk Interventions    08/07/2024    9:44 AM  Readmission Risk Prevention Plan  Transportation Screening Complete  PCP or Specialist Appt within 5-7 Days Complete  Home Care Screening Complete  Medication Review (RN CM) Complete    Signed: Heather Saltness, MSW, LCSW Clinical Social Worker Inpatient Care Management 08/11/2024 2:18 PM

## 2024-08-11 NOTE — Progress Notes (Signed)
 Discharge instructions reviewed with patient, verbalized understanding. All questions answered. All belongings accounted for. Patient to follow up with MD in  1-2 weeks. PIV removed.   Teach back completed with incentive spirometer as well.

## 2024-08-11 NOTE — Progress Notes (Signed)
 " PROGRESS NOTE    Leah [name]  FMW:[id number] DOB: 1946/07/25 DOA: 08/04/2024 PCP: Leah [name]    Brief Narrative:  79 year old with history of COPD but not on home oxygen, ongoing smoker, hypertension hyperlipidemia, anxiety and depression comes to the hospital with about a week of cough and shortness of breath.  Headaches.  Recently diagnosed with COVID-19 and treated conservatively.  Remains in the hospital due to persistent symptoms.  Subjective: Patient seen and examined.  Obviously anxious.  Sick on the abdomen and she thinks a bowel movement will help.  She is with flat affect and anxiety, looks comfortable breathing.  She is on room air.  She tells me that she is too nervous to go home.  No family at the bedside.  Assessment & Plan:   COPD with acute exacerbation secondary to COVID-19 infection: COVID-19 diagnosed 12/31.  Persistently symptomatic triggered by COVID-19 infection. Remains symptomatic.   Continue with Solu-Medrol , cannot take oral prednisone .   Bronchodilator therapy, steroid inhalation.  No indication for antibiotics.   Ambulate with PT OT.   deep breathing exercises, incentive spirometry, chest physiotherapy . Antibiotics due to severity of symptoms. Supplemental oxygen to keep saturations more than 90%.  Smoker: Counseled to quit.  Chronic medical issues including Hypertension, stable on lisinopril  Coronary artery disease, stable on Plavix  and atorvastatin . Hyperlipidemia, on statin Depression, on sertraline .  Uses Xanax at night.    DVT prophylaxis: enoxaparin  (LOVENOX ) injection 30 mg Start: 08/05/24 2200   Code Status: Full code Family Communication: None at the bedside Disposition Plan: Status is: Inpatient Remains inpatient appropriate because: Persistently symptomatic, deconditioned.     Consultants:  None  Procedures:  None  Antimicrobials:  None     Objective: Vitals:   08/10/24 2116 08/10/24 2121 08/11/24  0515 08/11/24 0820  BP: (!) 143/73  135/70   Pulse: 89  93   Resp: 20  20   Temp: 99 F (37.2 C)  99.6 F (37.6 C)   TempSrc: Oral  Oral   SpO2: 95% 91%  91%  Weight:      Height:        Intake/Output Summary (Last 24 hours) at 08/11/2024 1134 Last data filed at 08/11/2024 0800 Gross per 24 hour  Intake 580 ml  Output --  Net 580 ml   Filed Weights   08/04/24 2055  Weight: 39.9 kg    Examination:  General exam: Appears calm but anxious. Respiratory system: Clear to auscultation. Respiratory effort normal.  No added sounds. Cardiovascular system: S1 & S2 heard, RRR. [name] Gastrointestinal system: Soft.  Nontender.  Bowel sound present. Central nervous system: Alert and oriented. No focal neurological deficits.    Data Reviewed: I have personally reviewed following labs and imaging studies  CBC: Recent Labs  Lab 08/04/24 2058 08/05/24 0604 08/06/24 0556 08/07/24 0559 08/10/24 0538  WBC 5.9 4.7 8.1 9.2 8.1  NEUTROABS  --   --   --  6.9  --   HGB 14.0 12.7 11.5* 11.9* 12.4  HCT 42.8 38.4 34.5* 37.0 37.4  MCV 92.0 91.9 93.2 94.9 92.1  PLT 130* 103* 131* 179 255   Basic Metabolic Panel: Recent Labs  Lab 08/04/24 2058 08/05/24 0604 08/06/24 0556 08/07/24 0559 08/10/24 0538  NA 139 140 140 141 141  K 4.0 3.1* 4.0 4.3 4.1  CL 100 101 104 104 102  CO2 26 28 29 31  32  GLUCOSE 169* 208* 103* 90 95  BUN 11 9 10  17 18  CREATININE 0.81 0.71 0.66 0.69 0.76  CALCIUM  8.8* 8.7* 8.7* 8.8* 9.0  MG  --   --   --   --  2.2   GFR: Estimated Creatinine Clearance: 37.7 mL/min (by C-G formula based on SCr of 0.76 mg/dL). Liver Function Tests: Recent Labs  Lab 08/10/24 0538  AST 22  ALT 21  ALKPHOS 72  BILITOT 0.2  PROT 5.8*  ALBUMIN 3.6   No results for input(s): LIPASE, AMYLASE in the last 168 hours. No results for input(s): AMMONIA in the last 168 hours. Coagulation Profile: No results for input(s): INR, PROTIME in the last 168 hours. Cardiac  Enzymes: No results for input(s): CKTOTAL, CKMB, CKMBINDEX, TROPONINI in the last 168 hours. BNP (last 3 results) No results for input(s): PROBNP in the last 8760 hours. HbA1C: No results for input(s): HGBA1C in the last 72 hours. CBG: No results for input(s): GLUCAP in the last 168 hours. Lipid Profile: No results for input(s): CHOL, HDL, LDLCALC, TRIG, CHOLHDL, LDLDIRECT in the last 72 hours. Thyroid  Function Tests: No results for input(s): TSH, T4TOTAL, FREET4, T3FREE, THYROIDAB in the last 72 hours. Anemia Panel: No results for input(s): VITAMINB12, FOLATE, FERRITIN, TIBC, IRON, RETICCTPCT in the last 72 hours. Sepsis Labs: No results for input(s): PROCALCITON, LATICACIDVEN in the last 168 hours.  Recent Results (from the past 240 hours)  Resp panel by RT-PCR (RSV, Flu A&B, Covid) Anterior Nasal Swab     Status: Abnormal   Collection Time: 08/04/24 11:34 PM   Specimen: Anterior Nasal Swab  Result Value Ref Range Status   SARS Coronavirus 2 by RT PCR POSITIVE (A) NEGATIVE Final    Comment: (NOTE) SARS-CoV-2 target nucleic acids are DETECTED.  The SARS-CoV-2 RNA is generally detectable in upper respiratory specimens during the acute phase of infection. Positive results are indicative of the presence of the identified virus, but [name] not rule out bacterial infection or co-infection with other pathogens not detected by the test. Clinical correlation with patient history and other diagnostic information is necessary to determine patient infection status. The expected result is Negative.  Fact Sheet for Patients: bloggercourse.com  Fact Sheet for Healthcare Providers: seriousbroker.it  This test is not yet approved or cleared by the [place]  FDA and  has been authorized for detection and/or diagnosis of SARS-CoV-2 by FDA under an Emergency Use Authorization (EUA).  This EUA  will remain in effect (meaning this test can be used) for the duration of  the COVID-19 declaration under Section 564(b)(1) of the A ct, 21 U.S.C. section 360bbb-3(b)(1), unless the authorization is terminated or revoked sooner.     Influenza A by PCR NEGATIVE NEGATIVE Final   Influenza B by PCR NEGATIVE NEGATIVE Final    Comment: (NOTE) The Xpert Xpress SARS-CoV-2/FLU/RSV plus assay is intended as an aid in the diagnosis of influenza from Nasopharyngeal swab specimens and should not be used as a sole basis for treatment. Nasal washings and aspirates are unacceptable for Xpert Xpress SARS-CoV-2/FLU/RSV testing.  Fact Sheet for Patients: bloggercourse.com  Fact Sheet for Healthcare Providers: seriousbroker.it  This test is not yet approved or cleared by the [place]  FDA and has been authorized for detection and/or diagnosis of SARS-CoV-2 by FDA under an Emergency Use Authorization (EUA). This EUA will remain in effect (meaning this test can be used) for the duration of the COVID-19 declaration under Section 564(b)(1) of the Act, 21 U.S.C. section 360bbb-3(b)(1), unless the authorization is terminated or revoked.  Resp Syncytial Virus by PCR NEGATIVE NEGATIVE Final    Comment: (NOTE) Fact Sheet for Patients: bloggercourse.com  Fact Sheet for Healthcare Providers: seriousbroker.it  This test is not yet approved or cleared by the [place]  FDA and has been authorized for detection and/or diagnosis of SARS-CoV-2 by FDA under an Emergency Use Authorization (EUA). This EUA will remain in effect (meaning this test can be used) for the duration of the COVID-19 declaration under Section 564(b)(1) of the Act, 21 U.S.C. section 360bbb-3(b)(1), unless the authorization is terminated or revoked.  Performed at [facility], [address] [place], [place]  [address]          Radiology Studies: No results found.      Scheduled Meds:  arformoterol   15 mcg Nebulization BID   atorvastatin   40 mg Oral Daily   bisacodyl   10 mg Rectal Once   budesonide  (PULMICORT ) nebulizer solution  0.25 mg Nebulization BID   clopidogrel   75 mg Oral Daily   enoxaparin  (LOVENOX ) injection  30 mg Subcutaneous QHS   famotidine   20 mg Oral BID   lisinopril   20 mg Oral Daily   LORazepam   0.5 mg Oral QHS   [START ON 08/12/2024] methylPREDNISolone  (SOLU-MEDROL ) injection  40 mg Intravenous Daily   nicotine   21 mg Transdermal Daily   sertraline   25 mg Oral QHS   Continuous Infusions:   LOS: 6 days    Time spent: 40 minutes    Renato Applebaum, MD [facility]   "

## 2024-08-12 ENCOUNTER — Other Ambulatory Visit (HOSPITAL_COMMUNITY): Payer: Self-pay

## 2024-08-12 ENCOUNTER — Telehealth: Payer: Self-pay

## 2024-08-12 NOTE — Patient Instructions (Signed)
 Visit Information  Thank you for taking time to visit with me today. Please don't hesitate to contact me if I can be of assistance to   Following is a copy of your care plan:   Goals Addressed             This Visit's Progress    VBCI Transitions of Care (TOC) Care Plan       Problems:  Recent Hospitalization for treatment of COPD   Goal:  Over the next 30 days, the patient will not experience hospital readmission  Interventions:   COPD Interventions: Advised patient to track and manage COPD triggers Assessed social determinant of health barriers Discussed the importance of adequate rest and management of fatigue with COPD Provided education about and advised patient to utilize infection prevention strategies to reduce risk of respiratory infection Provided instruction about proper use of medications used for management of COPD including inhalers Monitor O2 sats to stay above 90% Continue with smoking cessation. Wear nicotine  patch  Patient Self Care Activities:  Attend all scheduled provider appointments Call pharmacy for medication refills 3-7 days in advance of running out of medications Call provider office for new concerns or questions  Notify RN Care Manager of [facility] call rescheduling needs Participate in Transition of Care Program/Attend [facility] scheduled calls Perform all self care activities independently  Take medications as prescribed    Plan:  Telephone follow up appointment with care management team member scheduled for:  Wednesday January 14th at 11:00am        Patient verbalizes understanding of instructions and care plan provided today and agrees to view in MyChart. Active MyChart status and patient understanding of how to access instructions and care plan via MyChart confirmed with patient.     The patient has been provided with contact information for the care management team and has been advised to call with any health related questions or concerns.    Please call the care guide team at [phone] if you need to cancel or reschedule your appointment.   Please call the Suicide and Crisis Lifeline: 988 call the [place]  National Suicide Prevention Lifeline: (253)854-7154 or TTY: 9281072139 TTY [phone]) to talk to a trained counselor if you are experiencing a Mental Health or Behavioral Health Crisis or need someone to talk to.  Medford Balboa, BSN, RN [place]  VBCI - [employer] [facility] RN Care Manager [phone]

## 2024-08-12 NOTE — Transitions of Care (Post Inpatient/ED Visit) (Signed)
 "  08/12/2024  Name: Leah [name] MRN: [id number] DOB: 11-19-1945  Today's TOC FU Call Status: Today's TOC FU Call Status:: Successful TOC FU Call Completed TOC FU Call Complete Date: 08/12/24  Patient's Name and Date of Birth confirmed. Name, DOB  Transition Care Management Follow-up Telephone Call Date of Discharge: 08/11/24 Discharge Facility: [facility]) Type of Discharge: Inpatient Admission Primary Inpatient Discharge Diagnosis:: COPD exacerbation secondary to COVID How have you been since you were released from the hospital?: Same Any questions or concerns?: No  Items Reviewed: Did you receive and understand the discharge instructions provided?: Yes Medications obtained,verified, and reconciled?: Yes (Medications Reviewed) Any new allergies since your discharge?: No Dietary orders reviewed?: NA Do you have support at home?: Yes People in Home [RPT]: alone, child(ren), adult Name of Support/Comfort Primary Source: [facility]  Medications Reviewed Today: Medications Reviewed Today     Reviewed by Moises Reusing, RN (Case Manager) on 08/12/24 at 1509  Med List Status: <None>   Medication Order Taking? Sig Documenting Provider Last Dose Status Informant  0.9 %  sodium chloride  infusion [id number]   Armbruster, Elspeth SQUIBB, MD  Active   albuterol  (VENTOLIN  HFA) 108 (90 Base) MCG/ACT inhaler [id number] No Inhale 2 puffs into the lungs every 4 (four) hours as needed for wheezing or shortness of breath. [provider] Past Week Active Self  atorvastatin  (LIPITOR ) 40 MG tablet [id number] No Take 1 tablet (40 mg total) by mouth daily. Court Dorn PARAS, MD 08/03/2024 Active Self  clopidogrel  (PLAVIX ) 75 MG tablet [id number] No TAKE 1 TABLET EVERY DAY Mona Vinie BROCKS, MD 08/03/2024 Active Self  dicyclomine  (BENTYL ) 10 MG capsule [id number] No TAKE 1 CAPSULE BY MOUTH AS NEEDED FOR UP TO 1 DOSE FOR SPASMS.  Patient taking differently: Take 10 mg by mouth every 6 (six)  hours as needed for spasms.   Legrand Victory LITTIE DOUGLAS, MD Unknown Active Self  estradiol  (CLIMARA  - DOSED IN MG/24 HR) 0.1 mg/24hr patch [id number] No Place 0.1 mg onto the skin every 14 (fourteen) days. [provider] 08/02/2024 Active Self           Med Note MARISA, NATHANEL SAILOR   Tue Aug 05, 2024  9:29 PM) The patient stated this was decreased to every 14 days  Evolocumab  (REPATHA  SURECLICK) 140 MG/ML SOAJ [id number] No INJECT 1 DOSE INTO THE SKIN EVERY 14 (FOURTEEN) DAYS.  Patient taking differently: Inject 140 mg into the skin every 14 (fourteen) days.   Mona Vinie BROCKS, MD 08/02/2024 Active Self  ezetimibe  (ZETIA ) 10 MG tablet [id number] No TAKE 1 TABLET EVERY DAY Antonio Meth, Jamee SAUNDERS, DO Unknown Active            Med Note MARISA, NATHANEL SAILOR   Tue Aug 05, 2024  9:31 PM) Patient was unsure IF she is currently taking this- last filled #90 on 06/30/2024  famotidine  (PEPCID ) 20 MG tablet [id number] No TAKE 1 TABLET TWICE DAILY  Patient taking differently: Take 20 mg by mouth 2 (two) times daily as needed for heartburn or indigestion.   Lowne Chase, Yvonne R, DO Unknown Active Self  fluticasone  (FLONASE ) 50 MCG/ACT nasal spray [id number] No Place 2 sprays into both nostrils daily.  Patient taking differently: Place 2 sprays into both nostrils daily as needed for allergies or rhinitis.   Antonio Meth Jamee R, DO Unknown Active Self  LAGEVRIO 200 MG CAPS capsule [id number] No Take 4 capsules by mouth every 12 (twelve) hours. [provider] 08/04/2024 Morning  Active Self  linaclotide  (LINZESS ) 72 MCG capsule [id number] No Take 1 capsule (72 mcg total) by mouth daily before breakfast.  Patient taking differently: Take 72 mcg by mouth daily as needed (for constipation- take before breakfast).   Leigh Elspeth SQUIBB, MD Unknown Active Self  lisinopril  (ZESTRIL ) 20 MG tablet [id number] No Take 1 tablet (20 mg total) by mouth daily. Antonio Cyndee Rockers R, DO 08/03/2024 Active Self  loratadine  (CLARITIN ) 10 MG  tablet [id number] No Take 1 tablet (10 mg total) by mouth daily. Antonio Cyndee Rockers [name], DO Unknown Active Self  LORazepam  (ATIVAN ) 0.5 MG tablet [id number] No Take 0.5 mg by mouth at bedtime.  [provider] 08/03/2024 Active Self           Med Note (ATKINS, HEATHER L   Sat Apr 08, 2016 10:00 AM)    nicotine  (NICODERM CQ  - DOSED IN MG/24 HOURS) 21 mg/24hr patch [id number]  Place 1 patch (21 mg total) onto the skin daily. Raenelle Coria, MD  Active   ondansetron  (ZOFRAN -ODT) 4 MG disintegrating tablet [id number] No Take 1 tablet (4 mg total) by mouth every 6 (six) hours as needed for nausea or vomiting.  Patient taking differently: Take 4 mg by mouth every 6 (six) hours as needed for nausea or vomiting (dissolve orally).   Leigh Elspeth SQUIBB, MD Unknown Active Self  pantoprazole  (PROTONIX ) 40 MG tablet [id number] No Take 1 tablet (40 mg total) by mouth daily.  Patient taking differently: Take 40 mg by mouth daily as needed (for reflux).   Antonio Cyndee Rockers R, DO Unknown Active Self  polyethylene glycol (MIRALAX ) 17 g packet [id number] No Take 17 g by mouth 2 (two) times daily.  Patient taking differently: Take 17 g by mouth 2 (two) times daily as needed for mild constipation.   Briana Elgin LABOR, MD Unknown Active Self  sertraline  (ZOLOFT ) 50 MG tablet [id number] No Take 50 mg by mouth at bedtime. [provider] 08/03/2024 Active Self  sucralfate  (CARAFATE ) 1 g tablet [id number] No Take 1 tablet by mouth 3 (three) times daily. [provider] Unknown Active            Med Note MARISA, NATHANEL SAILOR   Tue Aug 05, 2024  9:32 PM) Patient was unsure IF she is currently taking this- no last-filled date visible  Vitamin D , Ergocalciferol , (DRISDOL ) 1.25 MG (50000 UNIT) CAPS capsule [id number] No TAKE 1 CAPSULE EVERY WEEK  Patient taking differently: Take 50,000 Units by mouth every 7 (seven) days.   Antonio Cyndee Rockers [name], DO Past Week Active Self           Med Note MARISA, NATHANEL SAILOR   Tue Aug 05, 2024  9:15 PM) The patient does not have a specific day of the weeks she takes this            Home Care and Equipment/Supplies: Were Home Health Services Ordered?: Yes Name of Home Health Agency:: [facility] Has Agency set up a time to come to your home?: No EMR reviewed for Home Health Orders: Orders present/patient has not received call (refer to CM for follow-up) Any new equipment or medical supplies ordered?: No  Functional Questionnaire: Do you need assistance with bathing/showering or dressing?: No Do you need assistance with meal preparation?: No Do you need assistance with eating?: No Do you have difficulty maintaining continence: No Do you need assistance with getting out of bed/getting out of a chair/moving?: No Do you have difficulty managing or taking your  medications?: No  Follow up appointments reviewed: PCP Follow-up appointment confirmed?: Yes Date of PCP follow-up appointment?: 08/19/24 Follow-up Provider: Dr. Antonio Meth Specialist [facility] Follow-up appointment confirmed?: NA Do you need transportation to your follow-up appointment?: No Do you understand care options if your condition(s) worsen?: Yes-patient verbalized understanding  SDOH Interventions Today    Flowsheet Row Most Recent Value  SDOH Interventions   Food Insecurity Interventions Intervention Not Indicated  Housing Interventions Intervention Not Indicated  Transportation Interventions Intervention Not Indicated  Utilities Interventions Intervention Not Indicated    Goals      VBCI RN Care Plan     Problems:  Care coordination regarding referrals to Nutrition and Pulmonology  Goal: Over the next 7 days the Patient will schedule appointment with [facility] in [place].  Interventions:   Evaluation of current treatment plan related to Severe Protein Malnutrition, care coordination self-management and patient's adherence to plan as established by provider. Discussed plans with  patient for ongoing care management follow up and provided patient with direct contact information for care management team Collaborated with patient regarding discussion of nutritional barriers, scheduling of appointment.  Patient Self-Care Activities:  Attend all scheduled provider appointments Call pharmacy for medication refills 3-7 days in advance of running out of medications Take medications as prescribed    Plan:  No further follow up required: patient was provided [facility] contact info for any future needs.          VBCI Transitions of Care (TOC) Care Plan     Problems:  Recent Hospitalization for treatment of COPD   Goal:  Over the next 30 days, the patient will not experience hospital readmission  Interventions:   COPD Interventions: Advised patient to track and manage COPD triggers Assessed social determinant of health barriers Discussed the importance of adequate rest and management of fatigue with COPD Provided education about and advised patient to utilize infection prevention strategies to reduce risk of respiratory infection Provided instruction about proper use of medications used for management of COPD including inhalers Monitor O2 sats to stay above 90% Continue with smoking cessation. Wear nicotine  patch  Patient Self Care Activities:  Attend all scheduled provider appointments Call pharmacy for medication refills 3-7 days in advance of running out of medications Call provider office for new concerns or questions  Notify RN Care Manager of [facility] call rescheduling needs Participate in Transition of Care Program/Attend [facility] scheduled calls Perform all self care activities independently  Take medications as prescribed    Plan:  Telephone follow up appointment with care management team member scheduled for:  Wednesday January 14th at 11:00am        Medford Balboa, BSN, RN [place]  VBCI - Population Health RN Care Manager [phone]  "

## 2024-08-12 NOTE — Transitions of Care (Post Inpatient/ED Visit) (Signed)
" ° °  08/12/2024  Name: Leah [name] MRN: [id number] DOB: [birth year]  Today's TOC FU Call Status: Today's TOC FU Call Status:: Unsuccessful Call (2nd Attempt) Unsuccessful Call (1st Attempt) Date: 08/12/24 Unsuccessful Call (2nd Attempt) Date: 08/12/24  Attempted to reach the patient regarding the most recent Inpatient/ED visit.  Follow Up Plan: Additional outreach attempts will be made to reach the patient to complete the Transitions of Care (Post Inpatient/ED visit) call.   Medford Balboa, BSN, RN [place]  VBCI - Population Health RN Care Manager [phone]  "

## 2024-08-12 NOTE — Transitions of Care (Post Inpatient/ED Visit) (Signed)
" ° °  08/12/2024  Name: Leah [name] MRN: [id number] DOB: 1945/10/24  Today's TOC FU Call Status: Today's TOC FU Call Status:: Unsuccessful Call (1st Attempt) Unsuccessful Call (1st Attempt) Date: 08/12/24  Attempted to reach the patient regarding the most recent Inpatient/ED visit.  Follow Up Plan: Additional outreach attempts will be made to reach the patient to complete the Transitions of Care (Post Inpatient/ED visit) call.   Medford Balboa, BSN, RN [place]  VBCI - Population Health RN Care Manager [phone]  "

## 2024-08-13 ENCOUNTER — Other Ambulatory Visit: Payer: Self-pay

## 2024-08-13 ENCOUNTER — Ambulatory Visit: Admitting: Student

## 2024-08-13 ENCOUNTER — Encounter: Payer: Self-pay | Admitting: Student

## 2024-08-13 VITALS — BP 112/74 | HR 93 | Temp 98.3°F | Resp 14 | Ht 60.0 in | Wt 89.9 lb

## 2024-08-13 DIAGNOSIS — F17200 Nicotine dependence, unspecified, uncomplicated: Secondary | ICD-10-CM | POA: Diagnosis not present

## 2024-08-13 DIAGNOSIS — J441 Chronic obstructive pulmonary disease with (acute) exacerbation: Secondary | ICD-10-CM | POA: Diagnosis not present

## 2024-08-13 DIAGNOSIS — R54 Age-related physical debility: Secondary | ICD-10-CM | POA: Diagnosis not present

## 2024-08-13 DIAGNOSIS — Z09 Encounter for follow-up examination after completed treatment for conditions other than malignant neoplasm: Secondary | ICD-10-CM | POA: Diagnosis not present

## 2024-08-13 MED ORDER — FLUTICASONE-SALMETEROL 100-50 MCG/ACT IN AEPB: 1.0000 | INHALATION_SPRAY | Freq: Two times a day (BID) | RESPIRATORY_TRACT | 3 refills | Status: AC

## 2024-08-13 MED ORDER — DOXYCYCLINE HYCLATE 100 MG PO TABS: 100.0000 mg | ORAL_TABLET | Freq: Two times a day (BID) | ORAL | 0 refills | Status: AC

## 2024-08-13 MED ORDER — FLUTICASONE-SALMETEROL 100-50 MCG/ACT IN AEPB: 1.0000 | INHALATION_SPRAY | Freq: Two times a day (BID) | RESPIRATORY_TRACT | 1 refills | Status: DC

## 2024-08-13 NOTE — Patient Instructions (Signed)
 Visit Information  Thank you for taking time to visit with me today. Please don't hesitate to contact me if I can be of assistance to you before our next scheduled telephone appointment.  CM to follow up with the patient after clinic appointment today to assess for re-admission for COPD  Following is a copy of your care plan:   Goals Addressed   None     Patient verbalizes understanding of instructions and care plan provided today and agrees to view in MyChart. Active MyChart status and patient understanding of how to access instructions and care plan via MyChart confirmed with patient.     The patient has been provided with contact information for the care management team and has been advised to call with any health related questions or concerns.   Please call the care guide team at [phone] if you need to cancel or reschedule your appointment.   Please call the Suicide and Crisis Lifeline: 988 call the [place]  National Suicide Prevention Lifeline: 770 315 8133 or TTY: (920) 430-6388 TTY 581-008-8528) to talk to a trained counselor if you are experiencing a Mental Health or Behavioral Health Crisis or need someone to talk to.  Medford Balboa, BSN, RN [place]  VBCI - [employer] [facility] RN Care Manager [phone]

## 2024-08-13 NOTE — Progress Notes (Signed)
 "  Acute Office Visit  Subjective:     Patient ID: Leah [name], female    DOB: [birth year], 79 y.o.   MRN: [id number]  Chief Complaint  Patient presents with   Shortness of Breath    Patient is here for still having shortness of breath. PO2 ranging from 85-92. Feeling cold/chills      HPI Patient is in today for hospital follow-up.  PMHx-history of COPD, HTN, HLD, CAD, GERD,   Presented to hospital on  Recent hospitalization Admission date: 08-04-2024 Discharge date: 08-11-2024 CC: Shortness of breath Diagnosis: COPD exacerbation secondary to COVID-19  History of Present Illness Leah [name] is a 79 year old female with COPD who presents with symptoms of a recent exacerbation.  She has fatigue, weakness, and worsening shortness of breath following a recent hospitalization and is using albuterol  q 4-6 hours. She has a long smoking history but recently quit.   She has a productive cough with thick yellow-gray sputum, rib pain with coughing, and home oxygen readings that sometimes drop to 85-88%. She was diagnosed with COVID-19 on December 31st and has had ongoing symptoms since then. She lives alone, monitors her oxygen at home, and is using Tessalon  Perles for cough.   Patient denies fever, chills, SOB, CP, palpitations, dyspnea, edema, HA, vision changes, N/V/D, abdominal pain, urinary symptoms, rash, weight changes, and recent illness or hospitalizations.    ROS See HPI     Objective:    BP 112/74 (BP Location: Right Arm, Patient Position: Sitting, Cuff Size: Normal)   Pulse 93   Temp 98.3 F (36.8 C) (Oral)   Resp 14   Ht 5' (1.524 m)   Wt 89 lb 14.4 oz (40.8 kg)   SpO2 90%   BMI 17.56 kg/m    Physical Exam Vitals reviewed.  Constitutional:      General: She is not in acute distress.    Appearance: She is not toxic-appearing.  HENT:     Head: Normocephalic and atraumatic.     Mouth/Throat:     Mouth: Mucous membranes are moist.     Pharynx:  Oropharynx is clear.  Eyes:     Pupils: Pupils are equal, round, and reactive to light.  Cardiovascular:     Rate and Rhythm: Normal rate and regular rhythm.     Pulses: Normal pulses.     Heart sounds: Normal heart sounds. No murmur heard. Pulmonary:     Effort: Pulmonary effort is normal. No respiratory distress.     Breath sounds: Normal breath sounds. No wheezing.     Comments: Clear, diminished Musculoskeletal:        General: No swelling.     Cervical back: Neck supple.  Skin:    General: Skin is warm and dry.  Neurological:     General: No focal deficit present.     Mental Status: She is alert and oriented to person, place, and time.  Psychiatric:        Mood and Affect: Mood normal.        Behavior: Behavior normal.        Thought Content: Thought content normal.        Judgment: Judgment normal.     No results found for any visits on 08/13/24.      Assessment & Plan:   Problem List Items Addressed This Visit       Respiratory   COPD exacerbation (HCC)   Relevant Medications   fluticasone -salmeterol (ADVAIR) 100-50 MCG/ACT AEPB  Other   Frail elderly   Smoker (Chronic)   Other Visit Diagnoses       Hospital discharge follow-up    -  Primary   Relevant Orders   CBC   Basic Metabolic Panel (BMET)       COPD exacerbation r/t Covid-19 Recent exacerbation with fatigue, weakness, dyspnea.  - Rx-Symbicort  inhaler, one puff twice daily, continue albuterol  as needed. -Rx- Doxycycline  -Update CBC, BMP - Consult pulmonologist for pulmonary function testing and inhaler adjustment. - Monitor oxygen saturation at home, strict ED precautions given. Pt voiced understanding.   Nicotine  dependence Long smoking history contributing to COPD. Currently not smoking. On Nicotine  patch. Encouraged continued smoking cessation.  Frail Pt deconditioned, consider PT referral.     Meds ordered this encounter  Medications   DISCONTD: fluticasone -salmeterol  (ADVAIR) 100-50 MCG/ACT AEPB    Sig: Inhale 1 puff into the lungs 2 (two) times daily.    Dispense:  60 each    Refill:  1    Supervising Provider:   DOMENICA BLACKBIRD A [[id number]]   doxycycline  (VIBRA -TABS) 100 MG tablet    Sig: Take 1 tablet (100 mg total) by mouth 2 (two) times daily for 7 days.    Dispense:  14 tablet    Refill:  0    Supervising Provider:   DOMENICA BLACKBIRD A [[id number]]   fluticasone -salmeterol (ADVAIR) 100-50 MCG/ACT AEPB    Sig: Inhale 1 puff into the lungs 2 (two) times daily.    Dispense:  1 each    Refill:  3    Supervising Provider:   DOMENICA BLACKBIRD A [[id number]]    No follow-ups on file.  Leah LITTIE Jolly, NP   "

## 2024-08-13 NOTE — Transitions of Care (Post Inpatient/ED Visit) (Signed)
 " Transition of Care Follow up call due to SOB  Visit Note  08/13/2024  Name: Leah [name] MRN: [id number]          DOB: 1945/10/02  Situation: Patient enrolled in [facility] 30-day program. Visit completed with Bliss Mcclatchey by telephone.   Background:   Initial Transition Care Management Follow-up Telephone Call Discharge Date and Diagnosis: 08/11/24, COPD exacerbation secondary to COVID   Past Medical History:  Diagnosis Date   Allergy    ANEMIA, IRON DEFICIENCY 03/19/2009   Qualifier: Diagnosis of   By: Antonio ROSALEA Rockers         Anxiety    Barrett esophagus    Bladder pain    Cataract    Chronic bronchitis (HCC)    per pt last episode july 2016   Complication of anesthesia    slow to wake   COPD (chronic obstructive pulmonary disease) (HCC)    Depression    Diverticulosis of colon (without mention of hemorrhage) 03/14/2007   Centricity Description: DIVERTICULOSIS, COLON  Qualifier: Diagnosis of   By: Lewellyn CMA (AAMA), Amanda      Centricity Description: DIVERTICULOSIS-COLON  Qualifier: Diagnosis of   By: Ever Riggers, Amy S     Replacing diagnoses that were inactivated after the 10/30/22 regulatory import     Exocrine pancreatic insufficiency    Frequency of urination    GERD (gastroesophageal reflux disease)    Hiatal hernia    History of adenomatous polyp of colon    tubular adenoma   History of chronic gastritis    History of diverticulitis 03/02/2014   History of diverticulitis of colon    History of esophageal dilatation    for stricture   History of esophageal spasm    Hyperlipidemia    Hypertension    IBS 11/13/2007   Qualifier: Diagnosis of   By: Earlean CMA (AAMA), Amanda         IBS (irritable bowel syndrome)    Interstitial cystitis    PAD (peripheral artery disease)    Partial small bowel obstruction (HCC) 09/04/2019   Productive cough    intermittant   SBO (small bowel obstruction) (HCC) 09/19/2019   Smokers' cough (HCC)    Urgency of  urination     Assessment: Patient Reported Symptoms: Cognitive Cognitive Status: Alert and oriented to person, place, and time, Normal speech and language skills      Neurological Neurological Review of Symptoms: Not assessed    HEENT HEENT Symptoms Reported: Not assessed      Cardiovascular Cardiovascular Symptoms Reported: Not assessed    Respiratory Respiratory Symptoms Reported: Productive cough, Shortness of breath, Chest tightness Additional Respiratory Details: After a message was sent to the provider yesterday for the initial TOC call an appointment was scheduled for her to go to the clinic today for evaluation to see if she needs to go back to the hospital due to SOB and low O2 sats. Respiratory Management Strategies: Routine screening, Medication therapy, Coping strategies, Adequate rest Respiratory Self-Management Outcome: 2 (bad)  Endocrine Endocrine Symptoms Reported: Not assessed    Gastrointestinal Gastrointestinal Symptoms Reported: Not assessed      Genitourinary Genitourinary Symptoms Reported: Not assessed    Integumentary Integumentary Symptoms Reported: Not assessed    Musculoskeletal Musculoskelatal Symptoms Reviewed: Weakness        Psychosocial Psychosocial Symptoms Reported: Not assessed         There were no vitals filed for this visit.    Medications Reviewed Today  Reviewed by Moises Reusing, RN (Case Manager) on 08/13/24 at 1109  Med List Status: <None>   Medication Order Taking? Sig Documenting Provider Last Dose Status Informant  0.9 %  sodium chloride  infusion [id number]   Armbruster, Elspeth SQUIBB, MD  Active   albuterol  (VENTOLIN  HFA) 108 (90 Base) MCG/ACT inhaler [id number] No Inhale 2 puffs into the lungs every 4 (four) hours as needed for wheezing or shortness of breath. [provider] Past Week Active Self  atorvastatin  (LIPITOR ) 40 MG tablet [id number] No Take 1 tablet (40 mg total) by mouth daily. Court Dorn PARAS, MD  08/03/2024 Active Self  clopidogrel  (PLAVIX ) 75 MG tablet [id number] No TAKE 1 TABLET EVERY DAY Mona Vinie BROCKS, MD 08/03/2024 Active Self  dicyclomine  (BENTYL ) 10 MG capsule [id number] No TAKE 1 CAPSULE BY MOUTH AS NEEDED FOR UP TO 1 DOSE FOR SPASMS.  Patient taking differently: Take 10 mg by mouth every 6 (six) hours as needed for spasms.   Legrand Victory LITTIE DOUGLAS, MD Unknown Active Self  estradiol  (CLIMARA  - DOSED IN MG/24 HR) 0.1 mg/24hr patch [id number] No Place 0.1 mg onto the skin every 14 (fourteen) days. [provider] 08/02/2024 Active Self           Med Note MARISA, NATHANEL SAILOR   Tue Aug 05, 2024  9:29 PM) The patient stated this was decreased to every 14 days  Evolocumab  (REPATHA  SURECLICK) 140 MG/ML SOAJ [id number] No INJECT 1 DOSE INTO THE SKIN EVERY 14 (FOURTEEN) DAYS.  Patient taking differently: Inject 140 mg into the skin every 14 (fourteen) days.   Mona Vinie BROCKS, MD 08/02/2024 Active Self  ezetimibe  (ZETIA ) 10 MG tablet [id number] No TAKE 1 TABLET EVERY DAY Antonio Meth, Jamee SAUNDERS, DO Unknown Active            Med Note MARISA, NATHANEL SAILOR   Tue Aug 05, 2024  9:31 PM) Patient was unsure IF she is currently taking this- last filled #90 on 06/30/2024  famotidine  (PEPCID ) 20 MG tablet [id number] No TAKE 1 TABLET TWICE DAILY  Patient taking differently: Take 20 mg by mouth 2 (two) times daily as needed for heartburn or indigestion.   Lowne Chase, Yvonne R, DO Unknown Active Self  fluticasone  (FLONASE ) 50 MCG/ACT nasal spray [id number] No Place 2 sprays into both nostrils daily.  Patient taking differently: Place 2 sprays into both nostrils daily as needed for allergies or rhinitis.   Antonio Meth Jamee R, DO Unknown Active Self  LAGEVRIO 200 MG CAPS capsule [id number] No Take 4 capsules by mouth every 12 (twelve) hours. [provider] 08/04/2024 Morning Active Self  linaclotide  (LINZESS ) 72 MCG capsule [id number] No Take 1 capsule (72 mcg total) by mouth daily before breakfast.  Patient  taking differently: Take 72 mcg by mouth daily as needed (for constipation- take before breakfast).   Leigh Elspeth SQUIBB, MD Unknown Active Self  lisinopril  (ZESTRIL ) 20 MG tablet [id number] No Take 1 tablet (20 mg total) by mouth daily. Lowne Chase, Yvonne R, DO 08/03/2024 Active Self  loratadine  (CLARITIN ) 10 MG tablet [id number] No Take 1 tablet (10 mg total) by mouth daily. Antonio Meth Jamee SAUNDERS, DO Unknown Active Self  LORazepam  (ATIVAN ) 0.5 MG tablet [id number] No Take 0.5 mg by mouth at bedtime.  [provider] 08/03/2024 Active Self           Med Note (ATKINS, HEATHER L   Sat Apr 08, 2016 10:00 AM)    nicotine  (NICODERM CQ  - DOSED IN MG/24 HOURS)  21 mg/24hr patch [id number]  Place 1 patch (21 mg total) onto the skin daily. Raenelle Coria, MD  Active   ondansetron  (ZOFRAN -ODT) 4 MG disintegrating tablet [id number] No Take 1 tablet (4 mg total) by mouth every 6 (six) hours as needed for nausea or vomiting.  Patient taking differently: Take 4 mg by mouth every 6 (six) hours as needed for nausea or vomiting (dissolve orally).   Leigh Elspeth SQUIBB, MD Unknown Active Self  pantoprazole  (PROTONIX ) 40 MG tablet [id number] No Take 1 tablet (40 mg total) by mouth daily.  Patient taking differently: Take 40 mg by mouth daily as needed (for reflux).   Antonio Cyndee Rockers R, DO Unknown Active Self  polyethylene glycol (MIRALAX ) 17 g packet [id number] No Take 17 g by mouth 2 (two) times daily.  Patient taking differently: Take 17 g by mouth 2 (two) times daily as needed for mild constipation.   Briana Elgin LABOR, MD Unknown Active Self  sertraline  (ZOLOFT ) 50 MG tablet [id number] No Take 50 mg by mouth at bedtime. [provider] 08/03/2024 Active Self  sucralfate  (CARAFATE ) 1 g tablet [id number] No Take 1 tablet by mouth 3 (three) times daily. [provider] Unknown Active            Med Note MARISA, NATHANEL SAILOR   Tue Aug 05, 2024  9:32 PM) Patient was unsure IF she is currently taking this-  no last-filled date visible  Vitamin D , Ergocalciferol , (DRISDOL ) 1.25 MG (50000 UNIT) CAPS capsule [id number] No TAKE 1 CAPSULE EVERY WEEK  Patient taking differently: Take 50,000 Units by mouth every 7 (seven) days.   Lowne Chase, Yvonne R, DO Past Week Active Self           Med Note MARISA, NATHANEL SAILOR   Tue Aug 05, 2024  9:15 PM) The patient does not have a specific day of the weeks she takes this            Recommendation:   Acute PCP follow-up The patient is going to the clinic today at 2:00pm.  Follow Up Plan:   Telephone follow-up Unsure. Depends if the patient is sent back to the ED  Medford Balboa, BSN, RN [place]  VBCI - [facility] RN Care Manager [phone]     "

## 2024-08-14 ENCOUNTER — Ambulatory Visit: Payer: Self-pay | Admitting: Student

## 2024-08-14 LAB — CBC
HCT: 40.3 % (ref 36.0–46.0)
Hemoglobin: 13.9 g/dL (ref 12.0–15.0)
MCHC: 34.5 g/dL (ref 30.0–36.0)
MCV: 94.6 fl (ref 78.0–100.0)
Platelets: 280 K/uL (ref 150.0–400.0)
RBC: 4.26 Mil/uL (ref 3.87–5.11)
RDW: 13 % (ref 11.5–15.5)
WBC: 9.4 K/uL (ref 4.0–10.5)

## 2024-08-14 LAB — BASIC METABOLIC PANEL WITH GFR
BUN: 18 mg/dL (ref 6–23)
CO2: 32 meq/L (ref 19–32)
Calcium: 8.7 mg/dL (ref 8.4–10.5)
Chloride: 97 meq/L (ref 96–112)
Creatinine, Ser: 0.71 mg/dL (ref 0.40–1.20)
GFR: 82.72 mL/min
Glucose, Bld: 76 mg/dL (ref 70–99)
Potassium: 3.7 meq/L (ref 3.5–5.1)
Sodium: 137 meq/L (ref 135–145)

## 2024-08-19 ENCOUNTER — Ambulatory Visit: Admitting: Family Medicine

## 2024-08-19 ENCOUNTER — Encounter: Payer: Self-pay | Admitting: Family Medicine

## 2024-08-19 VITALS — BP 128/80 | HR 87 | Temp 98.0°F | Resp 18 | Ht 60.0 in | Wt 88.2 lb

## 2024-08-19 DIAGNOSIS — E43 Unspecified severe protein-calorie malnutrition: Secondary | ICD-10-CM

## 2024-08-19 DIAGNOSIS — F17200 Nicotine dependence, unspecified, uncomplicated: Secondary | ICD-10-CM

## 2024-08-19 DIAGNOSIS — J449 Chronic obstructive pulmonary disease, unspecified: Secondary | ICD-10-CM | POA: Diagnosis not present

## 2024-08-19 DIAGNOSIS — J441 Chronic obstructive pulmonary disease with (acute) exacerbation: Secondary | ICD-10-CM | POA: Diagnosis not present

## 2024-08-19 DIAGNOSIS — U071 COVID-19: Secondary | ICD-10-CM

## 2024-08-19 DIAGNOSIS — J1282 Pneumonia due to coronavirus disease 2019: Secondary | ICD-10-CM | POA: Diagnosis not present

## 2024-08-19 MED ORDER — NICOTINE 14 MG/24HR TD PT24: 14.0000 mg | MEDICATED_PATCH | Freq: Every day | TRANSDERMAL | 0 refills | Status: AC

## 2024-08-19 MED ORDER — DOXYCYCLINE HYCLATE 100 MG PO TABS: 100.0000 mg | ORAL_TABLET | Freq: Two times a day (BID) | ORAL | 0 refills | Status: AC

## 2024-08-19 NOTE — Progress Notes (Unsigned)
 "  Subjective:    Patient ID: Leah [name], female    DOB: 28-Apr-1946, 79 y.o.   MRN: [id number]  Chief Complaint  Patient presents with   COPD   Follow-up    HPI Patient is in today for copd f/u .  Discussed the use of AI scribe software for clinical note transcription with the patient, who gave verbal consent to proceed.  History of Present Illness Leah [name] is a 79 year old female who presents with post-COVID pneumonia symptoms and nicotine  patch side effects.  She contracted COVID-19 after visiting a grocery store on December 26th. Three days later, she began feeling unwell and visited urgent care, where she was prescribed a COVID medication starting with 'L' due to her use of blood thinners. She took the medication for five days without improvement and subsequently developed pneumonia, leading to hospitalization. She was discharged on January 5th and has been using inhalers since then. She feels 'a little better' but still experiences some symptoms.  She describes a burning sensation in her face, arms, and back occurring two to three times a day, and wonders if it could be coming from her neck. She has a history of neck surgery and has previously received injections for relief. She had to cancel a scheduled neck injection due to her illness but has rescheduled it for February 5th.  She has been using 21 mg nicotine  patches since her hospital stay to aid in smoking cessation, as she previously smoked a little over half a pack a day. The patches wake her up every hour at night, causing sweating and fidgetiness. She questions if the dosage might be too high for her.  She mentions that her pulmonologist previously found a lung nodule that was 'seven centimeters or something' which went away, and another that was 'two point something.' She last saw her pulmonologist in July and was advised to return in a year for a lung screening. She has not yet scheduled a follow-up  appointment post-hospitalization.  Her current medications include doxycycline , which she has been taking for seven days, with tomorrow being her last day on the current course. She has all her inhalers and is managing her symptoms post-discharge.    Past Medical History:  Diagnosis Date   Allergy    ANEMIA, IRON DEFICIENCY 03/19/2009   Qualifier: Diagnosis of   By: Antonio ROSALEA Rockers         Anxiety    Barrett esophagus    Bladder pain    Cataract    Chronic bronchitis (HCC)    per pt last episode july 2016   Complication of anesthesia    slow to wake   COPD (chronic obstructive pulmonary disease) (HCC)    Depression    Diverticulosis of colon (without mention of hemorrhage) 03/14/2007   Centricity Description: DIVERTICULOSIS, COLON  Qualifier: Diagnosis of   By: Lewellyn CMA (AAMA), Amanda      Centricity Description: DIVERTICULOSIS-COLON  Qualifier: Diagnosis of   By: Ever Riggers, Amy S     Replacing diagnoses that were inactivated after the 10/30/22 regulatory import     Exocrine pancreatic insufficiency    Frequency of urination    GERD (gastroesophageal reflux disease)    Hiatal hernia    History of adenomatous polyp of colon    tubular adenoma   History of chronic gastritis    History of diverticulitis 03/02/2014   History of diverticulitis of colon    History of esophageal dilatation  for stricture   History of esophageal spasm    Hyperlipidemia    Hypertension    IBS 11/13/2007   Qualifier: Diagnosis of   By: Lewellyn CMA (AAMA), Amanda         IBS (irritable bowel syndrome)    Interstitial cystitis    PAD (peripheral artery disease)    Partial small bowel obstruction (HCC) 09/04/2019   Productive cough    intermittant   SBO (small bowel obstruction) (HCC) 09/19/2019   Smokers' cough (HCC)    Urgency of urination     Past Surgical History:  Procedure Laterality Date   ABDOMINAL AORTOGRAM N/A 10/14/2018   Procedure:  ABDOMINAL AORTOGRAM;  Surgeon: Court Dorn PARAS, MD;  Location: [facility];  Service: Cardiovascular;  Laterality: N/A;   ABDOMINAL HYSTERECTOMY  1975   ANTERIOR CERVICAL DECOMP/DISCECTOMY FUSION N/A 09/07/2014   Procedure: Exploration of Fusion and Removal of Anterior Cervical Hematoma;  Surgeon: Reyes Budge, MD;  Location: [facility] NEURO ORS;  Service: Neurosurgery;  Laterality: N/A;   CARPAL TUNNEL RELEASE Right 1993   COLONOSCOPY  last one 04-30-2013   COLONOSCOPY WITH PROPOFOL  N/A 09/09/2019   Procedure: COLONOSCOPY WITH PROPOFOL ;  Surgeon: Legrand Victory LITTIE DOUGLAS, MD;  Location: [facility] ENDOSCOPY;  Service: Gastroenterology;  Laterality: N/A;   CYSTO WITH HYDRODISTENSION N/A 06/08/2015   Procedure: CYSTOSCOPY/HYDRODISTENSION INSTILLATION OF MARCAINE  AND PYRIDIUM ;  Surgeon: Norleen Seltzer, MD;  Location: [facility];  Service: Urology;  Laterality: N/A;   CYSTO/  HOD/  BLADDER BX  1990's   ESOPHAGOGASTRODUODENOSCOPY  last one 09-11-2012   LAPAROSCOPIC APPENDECTOMY N/A 09/21/2013   Procedure: APPENDECTOMY LAPAROSCOPIC;  Surgeon: Donnice Bury, MD;  Location: [facility] OR;  Service: General;  Laterality: N/A;   LAPAROSCOPIC CHOLECYSTECTOMY  2002   LAPAROSCOPY SIGMOID COLECTOMY  10-05-2008   diverticulitis   LOWER EXTREMITY ANGIOGRAPHY Left 10/14/2018   LOWER EXTREMITY ANGIOGRAPHY Bilateral 10/14/2018   Procedure: Lower Extremity Angiography;  Surgeon: Court Dorn PARAS, MD;  Location: [facility] INVASIVE CV LAB;  Service: Cardiovascular;  Laterality: Bilateral;  ILIACS   MICROLARYNGOSCOPY  09-27-2006   w/ true vocal cord stripping  and bilateral bx's of lesion's (benign)   PERIPHERAL VASCULAR INTERVENTION  10/14/2018   Procedure: PERIPHERAL VASCULAR INTERVENTION;  Surgeon: Court Dorn PARAS, MD;  Location: [facility] CV [facility];  Service: Cardiovascular;;   SINUS SURGERY WITH INSTATRAK  1991   TEMPOROMANDIBULAR JOINT SURGERY  1990   TONSILLECTOMY AND ADENOIDECTOMY  1964    Family  History  Problem Relation Age of Onset   Pancreatic cancer Mother    Colon polyps Mother    Pancreatic cancer Father    Diabetes Father    Heart disease Father    Leukemia Father    Prostate cancer Father    Hypertension Father    Heart disease Brother    Heart attack Brother    Other Daughter        prediabetes   Heart disease Daughter    Diabetes Daughter    Colon cancer Neg Hx    Esophageal cancer Neg Hx    Rectal cancer Neg Hx    Stomach cancer Neg Hx     Social History   Socioeconomic History   Marital status: Married    Spouse name: Not on file   Number of children: 3   Years of education: Not on file   Highest education level: Not on file  Occupational History   Not on file  Tobacco Use   Smoking status:  Former    Current packs/day: 0.00    Average packs/day: 0.7 packs/day for 49.0 years (36.3 ttl pk-yrs)    Types: Cigarettes    Start date: 2024    Quit date: 08/06/2024    Years since quitting: 0.0   Smokeless tobacco: Never   Tobacco comments:    Smokes a half a pack per day 07/01/2024 KRD  Vaping Use   Vaping status: Never Used  Substance and Sexual Activity   Alcohol use: No    Alcohol/week: 0.0 standard drinks of alcohol   Drug use: No   Sexual activity: Not Currently    Birth control/protection: Post-menopausal  Other Topics Concern   Not on file  Social History Narrative   Not on file   Social Drivers of Health   Tobacco Use: Medium Risk (08/19/2024)   Patient History    Smoking Tobacco Use: Former    Smokeless Tobacco Use: Never    Passive Exposure: Not on Actuary Strain: Not on file  Food Insecurity: No Food Insecurity (08/12/2024)   Epic    Worried About Programme Researcher, Broadcasting/film/video in the Last Year: Never true    Ran Out of Food in the Last Year: Never true  Transportation Needs: No Transportation Needs (08/12/2024)   Epic    Lack of Transportation (Medical): No    Lack of Transportation  (Non-Medical): No  Physical Activity: Not on file  Stress: Not on file  Social Connections: Socially Isolated (08/05/2024)   Social Connection and Isolation Panel    Frequency of Communication with Friends and Family: More than three times a week    Frequency of Social Gatherings with Friends and Family: More than three times a week    Attends Religious Services: Never    Database Administrator or Organizations: No    Attends Banker Meetings: Never    Marital Status: Widowed  Intimate Partner Violence: Not At Risk (08/12/2024)   Epic    Fear of Current or Ex-Partner: No    Emotionally Abused: No    Physically Abused: No    Sexually Abused: No  Depression (PHQ2-9): Low Risk (03/06/2023)   Depression (PHQ2-9)    PHQ-2 Score: 4  Alcohol Screen: Not on file  Housing: Unknown (08/12/2024)   Epic    Unable to Pay for Housing in the Last Year: No    Number of Times Moved in the Last Year: Not on file    Homeless in the Last Year: No  Utilities: Not At Risk (08/12/2024)   Epic    Threatened with loss of utilities: No  Health Literacy: Not on file    Outpatient Medications Prior to Visit  Medication Sig Dispense Refill   albuterol  (VENTOLIN  HFA) 108 (90 Base) MCG/ACT inhaler Inhale 2 puffs into the lungs every 4 (four) hours as needed for wheezing or shortness of breath.     atorvastatin  (LIPITOR ) 40 MG tablet Take 1 tablet (40 mg total) by mouth daily. 90 tablet 3   clopidogrel  (PLAVIX ) 75 MG tablet TAKE 1 TABLET EVERY DAY 90 tablet 3   dicyclomine  (BENTYL ) 10 MG capsule TAKE 1 CAPSULE BY MOUTH AS NEEDED FOR UP TO 1 DOSE FOR SPASMS. (Patient taking differently: Take 10 mg by mouth every 6 (six) hours as needed for spasms.) 90 capsule 0   estradiol  (CLIMARA  - DOSED IN MG/24 HR) 0.1 mg/24hr patch Place 0.1 mg onto the skin every 14 (fourteen) days.  0   Evolocumab  (REPATHA   SURECLICK) 140 MG/ML SOAJ INJECT 1 DOSE INTO THE SKIN EVERY 14 (FOURTEEN) DAYS. (Patient  taking differently: Inject 140 mg into the skin every 14 (fourteen) days.) 6 mL 3   ezetimibe  (ZETIA ) 10 MG tablet TAKE 1 TABLET EVERY DAY 90 tablet 0   famotidine  (PEPCID ) 20 MG tablet TAKE 1 TABLET TWICE DAILY (Patient taking differently: Take 20 mg by mouth 2 (two) times daily as needed for heartburn or indigestion.) 180 tablet 0   fluticasone  (FLONASE ) 50 MCG/ACT nasal spray Place 2 sprays into both nostrils daily. (Patient taking differently: Place 2 sprays into both nostrils daily as needed for allergies or rhinitis.) 16 g 6   LAGEVRIO 200 MG CAPS capsule Take 4 capsules by mouth every 12 (twelve) hours.     linaclotide  (LINZESS ) 72 MCG capsule Take 1 capsule (72 mcg total) by mouth daily before breakfast. (Patient taking differently: Take 72 mcg by mouth daily as needed (for constipation- take before breakfast).) 30 capsule 1   lisinopril  (ZESTRIL ) 20 MG tablet Take 1 tablet (20 mg total) by mouth daily. 90 tablet 1   loratadine  (CLARITIN ) 10 MG tablet Take 1 tablet (10 mg total) by mouth daily. 30 tablet 11   LORazepam  (ATIVAN ) 0.5 MG tablet Take 0.5 mg by mouth at bedtime.   5   nicotine  (NICODERM CQ  - DOSED IN MG/24 HOURS) 21 mg/24hr patch Place 1 patch (21 mg total) onto the skin daily. 28 patch 0   ondansetron  (ZOFRAN -ODT) 4 MG disintegrating tablet Take 1 tablet (4 mg total) by mouth every 6 (six) hours as needed for nausea or vomiting. (Patient taking differently: Take 4 mg by mouth every 6 (six) hours as needed for nausea or vomiting (dissolve orally).) 30 tablet 0   pantoprazole  (PROTONIX ) 40 MG tablet Take 1 tablet (40 mg total) by mouth daily. (Patient taking differently: Take 40 mg by mouth daily as needed (for reflux).) 90 tablet 1   polyethylene glycol (MIRALAX ) 17 g packet Take 17 g by mouth 2 (two) times daily. (Patient taking differently: Take 17 g by mouth 2 (two) times daily as needed for mild constipation.) 30 each 0   sertraline  (ZOLOFT ) 50 MG tablet Take 50 mg  by mouth at bedtime.     sucralfate  (CARAFATE ) 1 g tablet Take 1 tablet by mouth 3 (three) times daily.     Vitamin D , Ergocalciferol , (DRISDOL ) 1.25 MG (50000 UNIT) CAPS capsule TAKE 1 CAPSULE EVERY WEEK (Patient taking differently: Take 50,000 Units by mouth every 7 (seven) days.) 12 capsule 3   doxycycline  (VIBRA -TABS) 100 MG tablet Take 1 tablet (100 mg total) by mouth 2 (two) times daily for 7 days. 14 tablet 0   fluticasone -salmeterol (ADVAIR) 100-50 MCG/ACT AEPB Inhale 1 puff into the lungs 2 (two) times daily. 1 each 3   Facility-Administered Medications Prior to Visit  Medication Dose Route Frequency Provider Last Rate Last Admin   0.9 %  sodium chloride  infusion  500 mL Intravenous Once Armbruster, Elspeth SQUIBB, MD        Allergies[1]  Review of Systems  Constitutional:  Negative for chills, fever and malaise/fatigue.  HENT:  Negative for congestion and hearing loss.   Eyes:  Negative for discharge.  Respiratory:  Positive for cough. Negative for shortness of breath.   Cardiovascular:  Negative for chest pain, palpitations and leg swelling.  Gastrointestinal:  Negative for abdominal pain, blood in stool, constipation, diarrhea, heartburn, nausea and vomiting.  Genitourinary:  Negative for dysuria, frequency, hematuria and urgency.  Musculoskeletal:  Negative for back pain, falls and myalgias.  Skin:  Negative for rash.  Neurological:  Negative for dizziness, sensory change, loss of consciousness, weakness and headaches.  Endo/Heme/Allergies:  Negative for environmental allergies. Does not bruise/bleed easily.  Psychiatric/Behavioral:  Negative for depression and suicidal ideas. The patient is not nervous/anxious and does not have insomnia.        Objective:    Physical Exam Vitals and nursing note reviewed.  Constitutional:      General: She is not in acute distress.    Appearance: Normal appearance. She is well-developed.  HENT:     Head: Normocephalic and atraumatic.   Eyes:     General: No scleral icterus.       Right eye: No discharge.        Left eye: No discharge.  Cardiovascular:     Rate and Rhythm: Normal rate and regular rhythm.     Heart sounds: No murmur heard. Pulmonary:     Effort: Pulmonary effort is normal. No respiratory distress.     Breath sounds: Normal breath sounds.  Musculoskeletal:        General: Normal range of motion.     Cervical back: Normal range of motion and neck supple.     Right lower leg: No edema.     Left lower leg: No edema.  Skin:    General: Skin is warm and dry.  Neurological:     Mental Status: She is alert and oriented to person, place, and time.  Psychiatric:        Mood and Affect: Mood normal.        Behavior: Behavior normal.        Thought Content: Thought content normal.        Judgment: Judgment normal.    BP 128/80 (BP Location: Left Arm, Patient Position: Sitting, Cuff Size: Small)   Pulse 87   Temp 98 F (36.7 C) (Oral)   Resp 18   Ht 5' (1.524 m)   Wt 88 lb 3.2 oz (40 kg)   SpO2 95%   BMI 17.23 kg/m  Wt Readings from Last 3 Encounters:  08/19/24 88 lb 3.2 oz (40 kg)  08/13/24 89 lb 14.4 oz (40.8 kg)  08/12/24 88 lb (39.9 kg)    Diabetic Foot Exam - Simple   No data filed    Lab Results  Component Value Date   WBC 9.4 08/13/2024   HGB 13.9 08/13/2024   HCT 40.3 08/13/2024   PLT 280.0 08/13/2024   GLUCOSE 76 08/13/2024   CHOL 142 09/03/2023   TRIG 105.0 09/03/2023   HDL 46.90 09/03/2023   LDLDIRECT 91.0 01/10/2022   LDLCALC 75 09/03/2023   ALT 21 08/10/2024   AST 22 08/10/2024   NA 137 08/13/2024   K 3.7 08/13/2024   CL 97 08/13/2024   CREATININE 0.71 08/13/2024   BUN 18 08/13/2024   CO2 32 08/13/2024   TSH 1.44 09/03/2023   INR 1.0 05/21/2009   HGBA1C 6.2 07/05/2023    Lab Results  Component Value Date   TSH 1.44 09/03/2023   Lab Results  Component Value Date   WBC 9.4 08/13/2024   HGB 13.9 08/13/2024   HCT 40.3 08/13/2024   MCV 94.6 08/13/2024    PLT 280.0 08/13/2024   Lab Results  Component Value Date   NA 137 08/13/2024   K 3.7 08/13/2024   CO2 32 08/13/2024   GLUCOSE 76 08/13/2024   BUN 18 08/13/2024  CREATININE 0.71 08/13/2024   BILITOT 0.2 08/10/2024   ALKPHOS 72 08/10/2024   AST 22 08/10/2024   ALT 21 08/10/2024   PROT 5.8 (L) 08/10/2024   ALBUMIN 3.6 08/10/2024   CALCIUM  8.7 08/13/2024   ANIONGAP 7 08/10/2024   GFR 82.72 08/13/2024   Lab Results  Component Value Date   CHOL 142 09/03/2023   Lab Results  Component Value Date   HDL 46.90 09/03/2023   Lab Results  Component Value Date   LDLCALC 75 09/03/2023   Lab Results  Component Value Date   TRIG 105.0 09/03/2023   Lab Results  Component Value Date   CHOLHDL 3 09/03/2023   Lab Results  Component Value Date   HGBA1C 6.2 07/05/2023       Assessment & Plan:  Smoker -     Nicotine ; Place 1 patch (14 mg total) onto the skin daily.  Dispense: 28 patch; Refill: 0 -     Pulmonary Visit  COPD mixed type (HCC) -     Pulmonary Visit  Other orders -     Doxycycline  Hyclate; Take 1 tablet (100 mg total) by mouth 2 (two) times daily.  Dispense: 10 tablet; Refill: 0   Assessment and Plan Assessment & Plan Pneumonia due to COVID-19   She was recently hospitalized for COVID-19 pneumonia. Initial treatment with an unknown COVID-19 medication starting with 'L' was ineffective. Currently, she is on doxycycline  with some improvement. Labs post-discharge were abnormal but have since normalized. Follow up with a pulmonologist for post-hospitalization care.  Chronic obstructive pulmonary disease   She has COPD with a recent exacerbation due to COVID-19 pneumonia. No current follow-up with a pulmonologist. Schedule a follow-up appointment with a pulmonologist.  Nicotine  dependence   She is currently using nicotine  patches (21 mg) since January 5th, 2025, experiencing side effects such as sweating and waking up every hour. Prescription sent to pharmacy.  Consider lower dose patches if 14 mg is still too high.  Leah [name] Antonio Chase, DO      [1] Allergies Allergen Reactions   Amoxicillin-Pot Clavulanate Shortness Of Breath, Itching and Swelling   Avelox [Moxifloxacin Hcl In Nacl] Shortness Of Breath, Itching and Swelling   Ciprofloxacin Shortness Of Breath, Itching and Swelling   Keflex  [Cephalexin ] Shortness Of Breath and Swelling   Moxifloxacin Anaphylaxis, Shortness Of Breath, Itching, Swelling and Other (See Comments)    Throat swells   Penicillins Anaphylaxis, Shortness Of Breath, Itching, Swelling and Other (See Comments)    Did it involve swelling of the face/tongue/throat, SOB, or low BP? Yes Did it involve sudden or severe rash/hives, skin peeling, or any reaction on the inside of your mouth or nose? No Did you need to seek medical attention at a hospital or doctor's office? Yes When did it last happen? Several years ago- throat swells   Sulfa Antibiotics Shortness Of Breath, Itching and Swelling   Clindamycin /Lincomycin Itching and Swelling   Levaquin  [Levofloxacin ] Hives   Metronidazole  Other (See Comments)    Pt unsure of reaction   Prednisone  Other (See Comments)    GI irritation; is able to tolerate injections   Tizanidine  Other (See Comments)    Pt unsure of reaction   Tramadol  Itching   Amoxicillin Other (See Comments)    Reaction??   Clavulanic Acid Other (See Comments)    Reaction??  Dextrose  Other (See Comments)    Reaction??   Sulfur  Other (See Comments)    Reaction??   Acyclovir And Related Other (See Comments)    Pt does not recall this reaction   Hydrocodone  Rash   Zyvox  [Linezolid ] Diarrhea  "

## 2024-08-20 NOTE — Assessment & Plan Note (Signed)
 Con't advair and albuterol  S/p covid pneumonia F/u pulmonary

## 2024-08-20 NOTE — Patient Instructions (Signed)

## 2024-08-20 NOTE — Assessment & Plan Note (Signed)
 Using nicoderm cq  patch Lower 14 mg--- due to 21 mg causing insomnia

## 2024-08-20 NOTE — Assessment & Plan Note (Signed)
 Pt with decreased appetite --- worsened with recent illness

## 2024-08-20 NOTE — Assessment & Plan Note (Signed)
 Improving  Extend doxy for 5 more days F/u pulmonary

## 2024-08-20 NOTE — Assessment & Plan Note (Signed)
Cont advair and albuterol

## 2024-08-26 ENCOUNTER — Other Ambulatory Visit: Payer: Self-pay | Admitting: Family Medicine

## 2024-08-26 DIAGNOSIS — K219 Gastro-esophageal reflux disease without esophagitis: Secondary | ICD-10-CM

## 2024-08-27 ENCOUNTER — Telehealth: Payer: Self-pay

## 2024-09-04 ENCOUNTER — Telehealth: Payer: Self-pay

## 2024-09-04 NOTE — Telephone Encounter (Signed)
 noted

## 2024-09-04 NOTE — Telephone Encounter (Signed)
 Copied from CRM #[id number]. Topic: Clinical - Home Health Verbal Orders >> Sep 04, 2024  3:49 PM Emylou G wrote: [name]/Agency: Ronalee DEL w/[facility] Callback Number: [phone] secured line Service Requested: Physical Therapy Frequency: discharging because haven't seen her.. not returning calls.[name] and not in the hospital.. family is not calling back  Any new concerns about the patient? No
